# Patient Record
Sex: Male | Born: 1945
Health system: Southern US, Community
[De-identification: ages and names within clinical notes are randomized; demographics above are authoritative.]

## PROBLEM LIST (undated history)

## (undated) DIAGNOSIS — K269 Duodenal ulcer, unspecified as acute or chronic, without hemorrhage or perforation: Secondary | ICD-10-CM

## (undated) DIAGNOSIS — R112 Nausea with vomiting, unspecified: Secondary | ICD-10-CM

## (undated) DIAGNOSIS — K264 Chronic or unspecified duodenal ulcer with hemorrhage: Secondary | ICD-10-CM

## (undated) DIAGNOSIS — Z87442 Personal history of urinary calculi: Secondary | ICD-10-CM

## (undated) DIAGNOSIS — Z8739 Personal history of other diseases of the musculoskeletal system and connective tissue: Secondary | ICD-10-CM

## (undated) DIAGNOSIS — K219 Gastro-esophageal reflux disease without esophagitis: Secondary | ICD-10-CM

## (undated) DIAGNOSIS — K5903 Drug induced constipation: Secondary | ICD-10-CM

## (undated) DIAGNOSIS — I10 Essential (primary) hypertension: Secondary | ICD-10-CM

## (undated) DIAGNOSIS — Z8546 Personal history of malignant neoplasm of prostate: Secondary | ICD-10-CM

## (undated) DIAGNOSIS — C61 Malignant neoplasm of prostate: Secondary | ICD-10-CM

## (undated) DIAGNOSIS — N189 Chronic kidney disease, unspecified: Secondary | ICD-10-CM

## (undated) DIAGNOSIS — M4800 Spinal stenosis, site unspecified: Secondary | ICD-10-CM

## (undated) DIAGNOSIS — M1990 Unspecified osteoarthritis, unspecified site: Secondary | ICD-10-CM

## (undated) DIAGNOSIS — D649 Anemia, unspecified: Secondary | ICD-10-CM

## (undated) DIAGNOSIS — Z5189 Encounter for other specified aftercare: Secondary | ICD-10-CM

## (undated) DIAGNOSIS — R011 Cardiac murmur, unspecified: Secondary | ICD-10-CM

## (undated) DIAGNOSIS — E7800 Pure hypercholesterolemia, unspecified: Secondary | ICD-10-CM

## (undated) DIAGNOSIS — I350 Nonrheumatic aortic (valve) stenosis: Secondary | ICD-10-CM

## (undated) DIAGNOSIS — D689 Coagulation defect, unspecified: Secondary | ICD-10-CM

## (undated) DIAGNOSIS — Z87448 Personal history of other diseases of urinary system: Secondary | ICD-10-CM

## (undated) DIAGNOSIS — Z9889 Other specified postprocedural states: Secondary | ICD-10-CM

## (undated) HISTORY — PX: BACK SURGERY: SHX140

## (undated) HISTORY — PX: PROSTATECTOMY: SHX69

## (undated) HISTORY — DX: Pure hypercholesterolemia, unspecified: E78.00

## (undated) HISTORY — PX: SPINE SURGERY: SHX786

## (undated) HISTORY — DX: Encounter for other specified aftercare: Z51.89

## (undated) HISTORY — PX: CARDIAC CATHETERIZATION: SHX172

## (undated) HISTORY — DX: Personal history of malignant neoplasm of prostate: Z85.46

## (undated) HISTORY — DX: Nonrheumatic aortic (valve) stenosis: I35.0

## (undated) HISTORY — DX: Personal history of other diseases of the musculoskeletal system and connective tissue: Z87.39

## (undated) HISTORY — PX: MOHS SURGERY: SUR867

## (undated) HISTORY — DX: Coagulation defect, unspecified: D68.9

## (undated) HISTORY — DX: Essential (primary) hypertension: I10

## (undated) HISTORY — PX: KNEE ARTHROSCOPY: SUR90

## (undated) HISTORY — PX: JOINT REPLACEMENT: SHX530

## (undated) HISTORY — PX: CARDIAC VALVE REPLACEMENT: SHX585

## (undated) HISTORY — PX: CARPAL TUNNEL RELEASE: SHX101

## (undated) HISTORY — DX: Duodenal ulcer, unspecified as acute or chronic, without hemorrhage or perforation: K26.9

## (undated) HISTORY — DX: Personal history of other diseases of urinary system: Z87.448

## (undated) SURGERY — EGD (ESOPHAGOGASTRODUODENOSCOPY)
Anesthesia: Monitor Anesthesia Care

## (undated) NOTE — *Deleted (*Deleted)
[facility] Pharmacy  Name: Daryl Webb  MRN: [id number] DOB: Dec 09, 1945   Chief Complaint/ HPI  Daryl Webb,  41 y.o. , male presents for their Follow-Up CCM visit with the clinical pharmacist via telephone.   Patient is with his wife during the visit to help him out wit his medications. He reports to be doing well and had stories about his experiences for the past months.  PCP : [place], [name], MD  Their chronic conditions include: HTN, HLD, type 2 diabetes, osteoarthritis, CKD stage II  Office Visits: 11/19/19: Patient presented to Dr. Swaziland for follow-up. No medication changes made.  05/18/19 OV - 6 month f/u. Patient is stable. No changes with her meds. DM2 followed by Endocrinology. Advised to have lipid panel on next appt with endo.  Consult Visit: 07/04/19 OV ([facility]) - Urinary frequency. No changes with meds. Discussed possible causes of dysuria. Consider follow-up with urology for continued symptoms. Klebsiella found on urine culture and started on augmentin 875/125 mg 1 tablet twice daily x 14 days.  Medications: Outpatient Encounter Medications as of 11/28/2019  Medication Sig  . ACCU-CHEK SMARTVIEW test strip Use to test blood sugars once daily  . amLODipine (NORVASC) 5 MG tablet Take 1 tablet (5 mg total) by mouth daily.  Marland Kitchen amoxicillin (AMOXIL) 500 MG capsule TAKE 4 CAPSULES 1 HOUR PRIOR TO APPT  . aspirin 81 MG tablet Take 81 mg by mouth daily.  . Aspirin-Calcium Carbonate 81-777 MG TABS Take by mouth.   Marland Kitchen atorvastatin (LIPITOR) 40 MG tablet Take 1 tablet (40 mg total) by mouth daily.  Marland Kitchen BYDUREON BCISE 2 MG/0.85ML AUIJ Inject into the skin.  . Cholecalciferol 2000 UNITS CAPS Take 1 capsule by mouth daily. VITAMIN D 3  . Coenzyme Q10 (COQ10) 100 MG CAPS Take 100 mg by mouth daily.  . cyanocobalamin (,VITAMIN B-12,) 1000 MCG/ML injection Inject into the muscle every 30 (thirty) days.   . diclofenac (VOLTAREN) 75 MG EC tablet Take  1 tablet (75 mg total) by mouth daily as needed.  . loratadine (CLARITIN) 10 MG tablet Take 5 mg by mouth daily. As needed for allergies  . losartan-hydrochlorothiazide (HYZAAR) 50-12.5 MG tablet TAKE 1 TABLET DAILY  . metFORMIN (GLUCOPHAGE) 500 MG tablet Take 500 mg by mouth 2 (two) times daily.  . metoprolol succinate (TOPROL-XL) 50 MG 24 hr tablet TAKE 1 TABLET DAILY  . Omega-3 Fatty Acids (FISH OIL) 1000 MG CAPS Take 1,000 mg by mouth daily.   No facility-administered encounter medications on file as of 11/28/2019.     Transportation Needs: No Transportation Needs  . Lack of Transportation (Medical): No  . Lack of Transportation (Non-Medical): No     Physical Activity: Inactive  . Days of Exercise per Week: 0 days  . Minutes of Exercise per Session: 0 min    Current Diagnosis/Assessment:  Goals Addressed   None     Hypertension   Office blood pressures are  BP Readings from Last 3 Encounters:  11/19/19 120/78  07/04/19 130/74  05/18/19 110/70    Patient has failed these meds in the past: None Patient is currently controlled on the following medications:  . Amlodipine 5 mg 1 tablet daily HS . Losartan/HCTZ 50-12.5 mg 1 tablet daily AM . Metoprolol succinate 50 mg 1 tablet daily AM  Patient checks BP at home 2-3 times weekly  Patient home BP readings are ranging: 120/70  Patient within goal <130/80. We discussed diet and exercise extensively.  Patient reports having wrist monitor to check BP. Patient states that his diet could be better. Denies having exercise routine.  Plan  Continue current medications    Hyperlipidemia   Lipid Panel     Component Value Date/Time   CHOL 94 06/28/2017 0915   TRIG 112.0 06/28/2017 0915   HDL 27.90 (L) 06/28/2017 0915   CHOLHDL 3 06/28/2017 0915   VLDL 22.4 06/28/2017 0915   LDLCALC 43 06/28/2017 0915   LDLDIRECT 73.8 11/20/2012 0840     The ASCVD Risk score (Goff DC Jr., et al., 2013) failed to calculate for the  following reasons:   The valid total cholesterol range is 130 to 320 mg/dL   Patient has failed these meds in past: None Patient is currently controlled on the following medications:  . Atorvastatin 40 mg 1 tablet daily    Lipid panel outdated (2019). Needs new lipid panel to assess current state. Patient denies routine exercise. Denies myalgias associated with atorvastatin.  Plan  Continue current medications   Diabetes   Recent Relevant Labs: Lab Results  Component Value Date/Time   HGBA1C 6.9 06/05/2019 12:00 AM   HGBA1C 6.7 (H) 06/19/2015 09:06 AM   HGBA1C 7.1 (H) 01/06/2015 08:22 AM   GFR 41.54 (L) 05/18/2019 10:41 AM   GFR 51.26 (L) 12/18/2018 10:48 AM   MICROALBUR 18.9 11/19/2019 10:31 AM   MICROALBUR 84.2 01/06/2015 08:22 AM     Followed by Dr. Sharl Ma from [facility]. Checking BG when eating something that is not healthy. Patient does not want to be pursued lab works under 2 clinics. He mentions having frustration when 2 clinics have gaps with lab results. Patient has been trying to forward his results from Dr. Sharl Ma forward to Dr. Sw[place]il[place]. Current A1c on file is outdated. 06/08/19 - 6.9% per patient from his recent visit with Dr. Sharl Ma.  Patient has failed these meds in past: None Patient is currently controlled on the following medications:  . Bydureon 2 mg pen inject 1 dose into the skin once a week . Metformin 500 mg 1 tablet twice daily  Last diabetic Eye exam: No results found for: HMDIABEYEEXA  Last diabetic Foot exam: No results found for: HMDIABFOOTEX   Plan  Continue current medications   Osteoarthritis   Patient has failed these meds in past: None Patient is currently controlled on the following medications:  . Diclofenac EC 75 mg 1 tablet daily   Patient has been doing well with diclofenac for his arthritis. He mentions that when they tried to take him off this medications, his pain was unbearable. Patient understands the risk of being put on this. Advised  the patient of proper hydration and having food before taking diclofenac.  Plan  Continue current medications   OTCs/Health Maintenance   Patient is currently controlled on the following medications: . Aspirin 81 mg 1 tablet daily . Vitamin D 2000 units 1 capsule daily . CoQ10 100 mg 1 capsule daily . Vitamin B12 1000 mcg/ml inject into the muscle every 30 days . Loratidine 5 mg 1 tablet daily as needed for allergies  . Fish oil 1000 mg 1 capsule daily . Vitamin C 1000 mg 1 tablet daily  Plan  Continue current medications  Vaccines   Reviewed and discussed patient's vaccination history.    Immunization History  Administered Date(s) Administered  . Influenza, High Dose Seasonal PF 12/03/2016, 11/03/2018  . Influenza-Unspecified 01/08/2017  . PFIZER SARS-COV-2 Vaccination 03/12/2019, 04/02/2019, 11/07/2019  . Pneumococcal Polysaccharide-23 01/28/2017  . Zoster  Recombinat (Shingrix) 09/14/2018, 11/17/2018    Plan  Patient current on vaccines.   Medication Management   Pharm[facility]Benefits: [facility] [employer] Adherence: Patient has his own system of taking medication. He has vials lined up on the shelf of their bathroom. Pt endorses 100% compliance  [facility] for 90 day supply and [facility]  Plan  Continue current medication management strategy   Follow up: 4 month phone visit

---

## 1983-02-09 HISTORY — PX: VASECTOMY: SHX75

## 1999-03-03 ENCOUNTER — Ambulatory Visit (HOSPITAL_BASED_OUTPATIENT_CLINIC_OR_DEPARTMENT_OTHER): Admission: RE | Admit: 1999-03-03 | Discharge: 1999-03-03 | Payer: Self-pay | Admitting: Orthopedic Surgery

## 1999-03-17 ENCOUNTER — Ambulatory Visit (HOSPITAL_BASED_OUTPATIENT_CLINIC_OR_DEPARTMENT_OTHER): Admission: RE | Admit: 1999-03-17 | Discharge: 1999-03-17 | Payer: Self-pay | Admitting: Orthopedic Surgery

## 1999-08-07 ENCOUNTER — Other Ambulatory Visit: Admission: RE | Admit: 1999-08-07 | Discharge: 1999-08-07 | Payer: Self-pay | Admitting: Urology

## 1999-08-07 ENCOUNTER — Encounter (INDEPENDENT_AMBULATORY_CARE_PROVIDER_SITE_OTHER): Payer: Self-pay | Admitting: Specialist

## 1999-08-28 ENCOUNTER — Encounter: Payer: Self-pay | Admitting: Urology

## 1999-08-28 ENCOUNTER — Encounter: Admission: RE | Admit: 1999-08-28 | Discharge: 1999-08-28 | Payer: Self-pay | Admitting: Urology

## 1999-10-14 ENCOUNTER — Encounter: Payer: Self-pay | Admitting: Urology

## 1999-10-14 ENCOUNTER — Inpatient Hospital Stay (HOSPITAL_COMMUNITY): Admission: RE | Admit: 1999-10-14 | Discharge: 1999-10-18 | Payer: Self-pay | Admitting: Urology

## 2001-06-01 ENCOUNTER — Encounter: Payer: Self-pay | Admitting: *Deleted

## 2001-06-01 ENCOUNTER — Ambulatory Visit (HOSPITAL_COMMUNITY): Admission: RE | Admit: 2001-06-01 | Discharge: 2001-06-01 | Payer: Self-pay | Admitting: *Deleted

## 2001-06-13 ENCOUNTER — Encounter: Admission: RE | Admit: 2001-06-13 | Discharge: 2001-06-13 | Payer: Self-pay | Admitting: *Deleted

## 2001-06-13 ENCOUNTER — Encounter: Payer: Self-pay | Admitting: *Deleted

## 2002-02-08 HISTORY — PX: ARTHROPLASTY: SHX135

## 2002-03-08 ENCOUNTER — Encounter: Payer: Self-pay | Admitting: Orthopedic Surgery

## 2002-03-12 ENCOUNTER — Inpatient Hospital Stay (HOSPITAL_COMMUNITY): Admission: RE | Admit: 2002-03-12 | Discharge: 2002-03-16 | Payer: Self-pay | Admitting: Orthopedic Surgery

## 2002-08-14 ENCOUNTER — Encounter: Payer: Self-pay | Admitting: Family Medicine

## 2002-08-14 ENCOUNTER — Encounter: Admission: RE | Admit: 2002-08-14 | Discharge: 2002-08-14 | Payer: Self-pay | Admitting: Family Medicine

## 2005-03-10 ENCOUNTER — Emergency Department (HOSPITAL_COMMUNITY): Admission: EM | Admit: 2005-03-10 | Discharge: 2005-03-10 | Payer: Self-pay | Admitting: Emergency Medicine

## 2005-03-22 ENCOUNTER — Ambulatory Visit (HOSPITAL_BASED_OUTPATIENT_CLINIC_OR_DEPARTMENT_OTHER): Admission: RE | Admit: 2005-03-22 | Discharge: 2005-03-22 | Payer: Self-pay | Admitting: Urology

## 2006-05-23 ENCOUNTER — Ambulatory Visit (HOSPITAL_BASED_OUTPATIENT_CLINIC_OR_DEPARTMENT_OTHER): Admission: RE | Admit: 2006-05-23 | Discharge: 2006-05-23 | Payer: Self-pay | Admitting: Urology

## 2006-05-26 ENCOUNTER — Encounter: Admission: RE | Admit: 2006-05-26 | Discharge: 2006-05-26 | Payer: Self-pay | Admitting: Orthopedic Surgery

## 2006-05-27 ENCOUNTER — Encounter: Admission: RE | Admit: 2006-05-27 | Discharge: 2006-05-27 | Payer: Self-pay | Admitting: Orthopedic Surgery

## 2007-04-25 ENCOUNTER — Ambulatory Visit (HOSPITAL_BASED_OUTPATIENT_CLINIC_OR_DEPARTMENT_OTHER): Admission: RE | Admit: 2007-04-25 | Discharge: 2007-04-25 | Payer: Self-pay | Admitting: Urology

## 2009-12-05 ENCOUNTER — Ambulatory Visit: Payer: Self-pay | Admitting: Cardiology

## 2009-12-08 ENCOUNTER — Ambulatory Visit: Payer: Self-pay | Admitting: Cardiology

## 2010-02-26 ENCOUNTER — Encounter: Payer: Self-pay | Admitting: Gastroenterology

## 2010-03-12 NOTE — Letter (Signed)
Summary: Pre Visit Letter Revised  [facility]  [address] [place], [place] [address]   Phone: [phone]  Fax: [phone]        02/26/2010 MRN: [id number]  Daryl Webb [address] [place], [place]  [address]             Procedure Date:  03-31-10 9:30am           Dr Arlyce Dice - Direct Colon   Welcome to the Gastroenterology Division at [facility].    You are scheduled to see a nurse for your pre-procedure visit on 03-31-10 at 9:30am on the 3rd floor at [facility], [address]  We ask that you try to arrive at our office 15 minutes prior to your appointment time to allow for check-in.  Please take a minute to review the attached form.  If you answer "Yes" to one or more of the questions on the first page, we ask that you call the person listed at your earliest opportunity.  If you answer "No" to all of the questions, please complete the rest of the form and bring it to your appointment.    Your nurse visit will consist of discussing your medical and surgical history, your immediate family medical history, and your medications.   If you are unable to list all of your medications on the form, please bring the medication bottles to your appointment and we will list them.  We will need to be aware of both prescribed and over the counter drugs.  We will need to know exact dosage information as well.    Please be prepared to read and sign documents such as consent forms, a financial agreement, and acknowledgement forms.  If necessary, and with your consent, a friend or relative is welcome to sit-in on the nurse visit with you.  Please bring your insurance card so that we may make a copy of it.  If your insurance requires a referral to see a specialist, please bring your referral form from your primary care physician.  No co-pay is required for this nurse visit.     If you cannot keep your appointment, please call [phone] to cancel or reschedule  prior to your appointment date.  This allows [place] the opportunity to schedule an appointment for another patient in need of care.    Thank you for choosing [facility] for your medical needs.  We appreciate the opportunity to care for you.  Please visit [place] at our website  to learn more about our practice.  Sincerely, The Gastroenterology Division

## 2010-03-16 ENCOUNTER — Encounter (INDEPENDENT_AMBULATORY_CARE_PROVIDER_SITE_OTHER): Payer: Self-pay | Admitting: *Deleted

## 2010-03-17 ENCOUNTER — Encounter: Payer: Self-pay | Admitting: Gastroenterology

## 2010-03-17 ENCOUNTER — Encounter (INDEPENDENT_AMBULATORY_CARE_PROVIDER_SITE_OTHER): Payer: Self-pay | Admitting: *Deleted

## 2010-03-26 NOTE — Letter (Signed)
Summary: Diabetic Instructions  [facility]  [address] [place], [place] [address]   Phone: [phone]  Fax: [phone]    KOHEN REITHER [birth year] MRN: [id number]   _ x _   ORAL DIABETIC MEDICATION INSTRUCTIONS  The day before your procedure:   Take your diabetic pill as you do normally  The day of your procedure:   Do not take your diabetic pill    We will check your blood sugar levels during the admission process and again in Recovery before discharging you home  ________________________________________________________________________

## 2010-03-26 NOTE — Letter (Signed)
Summary: [facility] Instructions  [facility]  [address] [place], [place] [address]   Phone: [phone]  Fax: [phone]       Daryl Webb    February 09, 1945    MRN: [id number]        Procedure Day Dorna Bloom:  Jake Shark  03/31/10     Arrival Time:  8:30AM     Procedure Time:  9:30AM     [facility] of Procedure:                    [facility]th [facility]                      PREPARATION FOR COLONOSCOPY WITH MOVIPREP   Starting 5 days prior to your procedure 03/26/10 do not eat nuts, seeds, popcorn, corn, beans, peas,  salads, or any raw vegetables.  Do not take any fiber supplements (e.g. Metamucil, Citrucel, and Benefiber).  THE DAY BEFORE YOUR PROCEDURE         DATE: 03/30/10  DAY: MONDAY  1.  Drink clear liquids the entire day-NO SOLID FOOD  2.  Do not drink anything colored red or purple.  Avoid juices with pulp.  No orange juice.  3.  Drink at least 64 oz. (8 glasses) of fluid/clear liquids during the day to prevent dehydration and help the prep work efficiently.  CLEAR LIQUIDS INCLUDE: Water Jello Ice Popsicles Tea (sugar ok, no milk/cream) Powdered fruit flavored drinks Coffee (sugar ok, no milk/cream) Gatorade Juice: apple, white grape, white cranberry  Lemonade Clear bullion, consomm, broth Carbonated beverages (any kind) Strained chicken noodle soup Hard Candy                             4.  In the morning, mix first dose of MoviPrep solution:    Empty 1 Pouch A and 1 Pouch B into the disposable container    Add lukewarm drinking water to the top line of the container. Mix to dissolve    Refrigerate (mixed solution should be used within 24 hrs)  5.  Begin drinking the prep at 5:00 p.m. The MoviPrep container is divided by 4 marks.   Every 15 minutes drink the solution down to the next mark (approximately 8 oz) until the full liter is complete.   6.  Follow completed prep with 16 oz of clear liquid of your choice  (Nothing red or purple).  Continue to drink clear liquids until bedtime.  7.  Before going to bed, mix second dose of MoviPrep solution:    Empty 1 Pouch A and 1 Pouch B into the disposable container    Add lukewarm drinking water to the top line of the container. Mix to dissolve    Refrigerate  THE DAY OF YOUR PROCEDURE      DATE: 03/31/10   DAY: TUESDAY  Beginning at 4:30AM (5 hours before procedure):         1. Every 15 minutes, drink the solution down to the next mark (approx 8 oz) until the full liter is complete.  2. Follow completed prep with 16 oz. of clear liquid of your choice.    3. You may drink clear liquids until 7:30AM (2 HOURS BEFORE PROCEDURE).   MEDICATION INSTRUCTIONS  Unless otherwise instructed, you should take regular prescription medications with a small sip of water   as early as possible the morning of  your procedure.  Diabetic patients - see separate instructions.   Additional medication instructions: Hold Losartan/HCTZ the morning of procedure.         OTHER INSTRUCTIONS  You will need a responsible adult at least 65 years of age to accompany you and drive you home.   This person must remain in the waiting room during your procedure.  Wear loose fitting clothing that is easily removed.  Leave jewelry and other valuables at home.  However, you may wish to bring a book to read or  an iPod/MP3 player to listen to music as you wait for your procedure to start.  Remove all body piercing jewelry and leave at home.  Total time from sign-in until discharge is approximately 2-3 hours.  You should go home directly after your procedure and rest.  You can resume normal activities the  day after your procedure.  The day of your procedure you should not:   Drive   Make legal decisions   Operate machinery   Drink alcohol   Return to work  You will receive specific instructions about eating, activities and medications before you  leave.    The above instructions have been reviewed and explained to me by   Wyona Almas RN  March 17, 2010 1:30 PM     I fully understand and can verbalize these instructions _____________________________ Date _________

## 2010-03-26 NOTE — Miscellaneous (Signed)
Summary: [facility] Previsit/prep  Clinical Lists Changes  Medications: Added new medication of MOVIPREP 100 GM  SOLR (PEG-KCL-NACL-NASULF-NA ASC-C) As per prep instructions. - Signed Rx of MOVIPREP 100 GM  SOLR (PEG-KCL-NACL-NASULF-NA ASC-C) As per prep instructions.;  #1 x 0;  Signed;  Entered by: Wyona Almas RN;  Authorized by: Louis Meckel MD;  Method [facility]  [address] [phone]*, [address], [place], [place]  [address], Ph: [phone] or [phone], Fax: [phone] Observations: Added new observation of NKA: T (03/17/2010 12:55)    Prescriptions: MOVIPREP 100 GM  SOLR (PEG-KCL-NACL-NASULF-NA ASC-C) As per prep instructions.  #1 x 0   Entered by:   Wyona Almas RN   Authorized by:   Louis Meckel MD   Signed by:   Wyona Almas RN on 03/17/2010   Method used:   [facility]        [facility]  [address] [phone]* (retail)       [address]       [place], [place]  [address]       Ph: [phone] or [phone]       Fax: [phone]   RxID:   [id number]

## 2010-03-31 ENCOUNTER — Other Ambulatory Visit: Payer: Self-pay | Admitting: Gastroenterology

## 2010-03-31 ENCOUNTER — Other Ambulatory Visit (AMBULATORY_SURGERY_CENTER): Payer: BC Managed Care – PPO | Admitting: Gastroenterology

## 2010-03-31 DIAGNOSIS — Z1211 Encounter for screening for malignant neoplasm of colon: Secondary | ICD-10-CM

## 2010-03-31 DIAGNOSIS — D126 Benign neoplasm of colon, unspecified: Secondary | ICD-10-CM

## 2010-03-31 LAB — GLUCOSE, CAPILLARY
Glucose-Capillary: 144 mg/dL — ABNORMAL HIGH (ref 70–99)
Glucose-Capillary: 129 mg/dL — ABNORMAL HIGH (ref 70–99)

## 2010-04-03 ENCOUNTER — Other Ambulatory Visit (INDEPENDENT_AMBULATORY_CARE_PROVIDER_SITE_OTHER): Payer: BC Managed Care – PPO

## 2010-04-03 DIAGNOSIS — E119 Type 2 diabetes mellitus without complications: Secondary | ICD-10-CM

## 2010-04-03 DIAGNOSIS — I119 Hypertensive heart disease without heart failure: Secondary | ICD-10-CM

## 2010-04-07 ENCOUNTER — Encounter: Payer: Self-pay | Admitting: Gastroenterology

## 2010-04-07 ENCOUNTER — Ambulatory Visit (INDEPENDENT_AMBULATORY_CARE_PROVIDER_SITE_OTHER): Payer: BC Managed Care – PPO | Admitting: Cardiology

## 2010-04-07 DIAGNOSIS — I119 Hypertensive heart disease without heart failure: Secondary | ICD-10-CM

## 2010-04-07 DIAGNOSIS — Z79899 Other long term (current) drug therapy: Secondary | ICD-10-CM

## 2010-04-07 DIAGNOSIS — E119 Type 2 diabetes mellitus without complications: Secondary | ICD-10-CM

## 2010-04-07 DIAGNOSIS — E7800 Pure hypercholesterolemia, unspecified: Secondary | ICD-10-CM

## 2010-04-07 NOTE — Procedures (Addendum)
Summary: Colonoscopy  Patient: Daryl Webb Note: All result statuses are Final unless otherwise noted.  Tests: (1) Colonoscopy (COL)   COL Colonoscopy           DONE     [facility]     [address]     [place], [place]  [address]          COLONOSCOPY PROCEDURE REPORT          PATIENT:  Daryl Webb  MR#:  [id number]     BIRTHDATE:  1945/10/06, 65 yrs. old  GENDER:  male          ENDOSCOPIST:  Barbette Hair. Arlyce Dice, MD     Referred by:  Cassell Clement, M.D.          PROCEDURE DATE:  03/31/2010     PROCEDURE:  Colonoscopy with snare polypectomy     ASA CLASS:  Class II     INDICATIONS:  1) Routine Risk Screening          MEDICATIONS:   Fentanyl 75 mcg IV, Versed 7 mg IV          DESCRIPTION OF PROCEDURE:   After the risks benefits and     alternatives of the procedure were thoroughly explained, informed     consent was obtained.  Digital rectal exam was performed and     revealed no abnormalities.   The LB 180AL [id number] endoscope was     introduced through the anus and advanced to the cecum, which was     identified by both the appendix and ileocecal valve, without     limitations.  The quality of the prep was good, using MoviPrep.     The instrument was then slowly withdrawn as the colon was fully     examined.     <<PROCEDUREIMAGES>>          FINDINGS:  A sessile polyp was found in the sigmoid colon. It was     5 mm in size. It was found 18 cm from the point of entry. Polyp     was snared without cautery. Retrieval was successful (see     image14). snare polyp  This was otherwise a normal examination of     the colon (see image2, image4, image6, image8, and image15).     Retroflexed views in the rectum revealed no abnormalities.    The     time to cecum =  3.75  minutes. The scope was then withdrawn (time     =  8.75  min) from the patient and the procedure completed.          COMPLICATIONS:  None          ENDOSCOPIC IMPRESSION:     1) 5  mm sessile polyp in the sigmoid colon     2) Otherwise normal examination     RECOMMENDATIONS:     1) If the polyp(s) removed today are proven to be adenomatous     (pre-cancerous) polyps, you will need a repeat colonoscopy in 5     years. Otherwise you should continue to follow colorectal cancer     screening guidelines for "routine risk" patients with colonoscopy     in 10 years.          REPEAT EXAM:   You will receive a letter from Dr. Arlyce Dice in 1-2     weeks, after reviewing the final pathology, with followup     recommendations.  ______________________________     Barbette Hair Arlyce Dice, MD          CC:          n.     eSIGNED:   Barbette Hair. Kaplan at 03/31/2010 10:51 AM          Vonna Drafts, [id number]  Note: An exclamation mark (!) indicates a result that was not dispersed into the flowsheet. Document Creation Date: 03/31/2010 10:51 AM _______________________________________________________________________  (1) Order result status: Final Collection or observation date-time: 03/31/2010 10:46 Requested date-time:  Receipt date-time:  Reported date-time:  Referring Physician:   Ordering Physician: Melvia Heaps [phone]) Specimen Source:  Source: Launa Grill Order Number: [id number] Lab site:   Appended Document: Colonoscopy     Procedures Next Due Date:    Colonoscopy: 04/2020

## 2010-04-08 ENCOUNTER — Ambulatory Visit: Payer: Self-pay | Admitting: Cardiology

## 2010-04-16 NOTE — Letter (Signed)
Summary: Patient Notice-Hyperplastic Polyps  [facility]  [address] [place], [place] [address]   Phone: [phone]  Fax: [phone]        April 07, 2010 MRN: [id number]    Daryl Webb [address] [place], [place]  [address]    Dear Mr. BROCK,  I am pleased to inform you that the colon polyp(s) removed during your recent colonoscopy was (were) found to be hyperplastic.  These types of polyps are NOT pre-cancerous.  It is therefore my recommendation that you have a repeat colonoscopy examination in 10_ years for routine colorectal cancer screening.  Should you develop new or worsening symptoms of abdominal pain, bowel habit changes or bleeding from the rectum or bowels, please schedule an evaluation with either your primary care physician or with me.  Additional information/recommendations:  __No further action with gastroenterology is needed at this time.      Please follow-up with your primary care physician for your other      healthcare needs. __Please call [phone] to schedule a return visit to review      your situation.  __Please keep your follow-up visit as already scheduled.  _x_Continue treatment plan as outlined the day of your exam.  Please call us if you are having persistent problems or have questions about your condition that have not been fully answered at this time.  Sincerely,  Louis Meckel MD This letter has been electronically signed by your physician.  Appended Document: Patient Notice-Hyperplastic Polyps letter mailed

## 2010-06-23 NOTE — Op Note (Signed)
NAME:  Daryl Webb, Daryl Webb NO.:  [id number]   MEDICAL RECORD NO.:  [id number]          PATIENT TYPE:  AMB   LOCATION:  [facility]                         FACILITY:  [facility]   PHYSICIAN:  Bertram Millard. Dahlstedt, M.D.DATE OF BIRTH:  13-May-1945   DATE OF PROCEDURE:  04/25/2007  DATE OF DISCHARGE:                               OPERATIVE REPORT   PREOPERATIVE DIAGNOSIS:  Recurrent urethral stricture   POSTOPERATIVE DIAGNOSIS:  Recurrent urethral stricture   PROCEDURES:  1. Cystoscopy.  2. Balloon dilatation of recurrent urethral stricture.   SURGEON:  Bertram Millard. Dahlstedt, M.D.   ANESTHESIA:  General with LMA.   COMPLICATIONS:  None.   BRIEF HISTORY:  A 65 year old male who is a few years out from radical  prostatectomy.  He has no evidence of recurrent prostate cancer.  He  presented to the office yesterday in Dr. Enos Fling absence.  He has  been having a much more difficult time with urination.  Symptoms are  reminiscent of prior slowing of his stream when he did have a urethral  stricture.  As he is quite symptomatic at this point, it was recommended  that he have another procedure for his stricture.  Risks and  complications of balloon dilatation of the stricture were discussed with  him at length.  He understands these and desires to proceed.   DESCRIPTION OF PROCEDURE:  The patient was identified in the holding  area, administered preoperative IV antibiotics and taken to the  operating room, where a general anesthetic was administered using the  LMA.  He was placed in the dorsal lithotomy position.  Genitalia and  perineum were prepped and draped.  A time-out was then called.  Proper  patient, proper procedure, diagnosis, allergy and positioning were  discussed.  We then commenced with the procedure.   A 22-French panendoscope was passed under direct vision up to the  urethral stricture, which was at the distal bulbous urethra.  It was  quite tight.  A a sensor-tip  guidewire was passed through this.  Over  top of this a nephrostomy balloon was passed.  It was inflated to  approximately 18 atmospheres of pressure for approximately 5 minutes.  The balloon traversed the urethral stricture and actually went all the  way into the bladder through the bladder neck.  Once the balloon was  inflated for 5 minutes, the balloon was then deflated and removed.  Cystoscopy revealed easy passage of the scope all the way through the  stricture.  There was a minimal narrowing of the bladder neck but no  significant contracture.  The bladder appeared otherwise normal.  At  this point about 100 mL of irrigant was left in the bladder.  The scope  was removed and the procedure terminated.   The patient tolerated the procedure well.  He was awakened and taken to  the PACU in stable condition.   He will follow up with me in the office in 1 week.  He was discharged on  Utira-C one p.o. q.6 h. p.r.n. burning and Bactrim DS one p.o. b.i.d.  for 3 days.      Jeannett Senior  Elza Rafter, M.D.  Electronically Signed     SMD/MEDQ  D:  04/25/2007  T:  04/25/2007  Job:  [id number]

## 2010-06-26 NOTE — Discharge Summary (Signed)
. [facility]  Patient:    Daryl Webb, Daryl Webb                 MRN: [id number] Adm. Date:  [id number] Disc. Date: [id number] Attending:  Lauree Chandler                           Discharge Summary  FINAL DIAGNOSES: 1. Carcinoma of the prostate. 2. Hypertension. 3. Arthritis. 4. Hypercholesterolemia.  PROCEDURES:  Radical retropubic prostatectomy and bilateral pelvic lymph node dissection on October 14, 1999.  HISTORY OF PRESENT ILLNESS:  This 65 year old white male was found to have a Gleason grade 7 carcinoma because he had a suspicious nodule on the prostate with a PSA of 2.68.  He was counseled about therapy and opted for radical retropubic prostatectomy.  PAST MEDICAL HISTORY:  Elevated cholesterol, arthritis, and history of hypertension.  PHYSICAL EXAMINATION:  Noncontributory, except for a suspected nodule on the right lobe of the prostate.  HOSPITAL COURSE:  After admission, he underwent radical retropubic prostatectomy and bilateral pelvic lymph node dissection.  The final pathology revealed Gleason grade 7 carcinoma felt to be confined to the prostate, but involving both lobes.  The nodes were negative.  During his postoperative recovery, he did well.  He had a few bladder spasms early on, but he was able to ambulate and became afebrile and progressed to a soft diet by the time he was ready for discharge on October 18, 1999.  On October 17, 1999, his Harrison Mons drain had been removed.  DISPOSITION:  He was sent home on October 18, 1999, with Foley catheter to leg bag.  ACTIVITY:  Limited.  DIET:  As tolerated.  CONDITION ON DISCHARGE:  He was sent home in good condition.  FOLLOW-UP:  He will return to the office next week for skin staple removal. DD:  11/02/99 TD:  11/02/99 Job: [id number]

## 2010-06-26 NOTE — Op Note (Signed)
[place]. [facility]  Patient:    Daryl Webb, Daryl Webb                 MRN: [id number] Proc. Date: 10/14/99 Adm. Date:  [id number] Attending:  Lauree Chandler CC:         Clovis Pu [name] Sermons, M.D.   Operative Report  PREOPERATIVE DIAGNOSIS:  Carcinoma of the prostate.  POSTOPERATIVE DIAGNOSIS:  Carcinoma of the prostate.  PROCEDURE:  Radical retropubic prostatectomy and bilateral pelvic lymph node dissection.  SURGEON:  Maretta Bees. Vonita Moss, M.D.  ASSISTANT:  Lucrezia Starch. Ovidio Hanger, M.D.  ANESTHESIA:  General.  INDICATIONS:  This 65 year old gentleman had a needle biopsy of the prostate. 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  This was done because he was felt to have a prostatic nodule despite a PSA of only 2.68.  He was brought to the OR today for curative therapy.  PROCEDURE:  The patient was brought to the operating room, placed in supine position and the lower abdomen and external genitalia were prepped and draped in the usual fashion.  A 24-French/30 cc Foley was placed per urethra and the bladder drained.  A mid lower vertical incision was made just above the symphysis pubis below the umbilicus and the rectus fascia and muscle divided in the midline.  The bilateral pelvic retroperitoneal spaces were dissected out and the right obturator lymph node packet was dissected out and sent for permanent section.  Hemostasis and lymphostasis was obtained with metal hemoclips.  The great vessels and obturator nerve were intact.  In like fashion, the left obturator and lymph node packet was dissected out and sent to pathology.  The endopelvic fascia was then taken down.  The puboprostatic ligaments divided at the attachment to the symphysis pubis.  A Hohenfeltner clamp was placed dorsal venous complex and a heavy Vicryl tied around it and subsequently also for added hemostasis later in the case, a  2-0 Vicryl suture ligature in a figure-of-eight fashion was placed in this dorsal vein complex. The urethra was divided just beyond the apex of the prostate.  The Foley was brought up out of the wound and posterior urethra divided and the prostate dissected off the rectum without difficulty.  Although, it was initially a little bit stuck, but certainly free without injury the the rectum.  The puboprostatic ligaments were taken down bilaterally and clipped with metal hemoclips.  The anterior bladder neck was opened and the indigo carmen-stained urine was ejected from ureteral orifices well away from the posterior bladder neck, which was then divided with cautery in a plane between the bladder and the prostate easily dissected out.  The seminal vesicles and ampulla of each vas were dissected down to the tips of each seminal vesicle.  These structures were then divided and ligated with metal hemoclips and the specimen removed intact.  A couple small bleeders to the bladder neck were suture ligated.  The bladder mucosa was reapproximated to the surrounding bladder neck muscle with interrupted 4-0 chromic catgut.  The posterior bladder neck was closed in tennis racket fashion with running 2-0 chromic catgut, which was well away from the ureteral orifices.  The bladder neck/urethral anastomosis was then completed over a 22-French/5 cc Foley by placing 2-0 Vicryl at 2, 5, 7, 10 and 12 oclock before the Foley was placed in the bladder, a #1 Prolene was tied through the tips of the catheter and then brought  out through the anterior bladder wall and through the abdominal wall and later sewn to skin level attached to a button.  The anastomosis tied down quite nicely and the bladder was irrigated and there was blue-stained urine with no leak.  Catheter was placed on traction.  Stab wound was placed to the left of our incision through which a large flat Blake drain was placed to drain the prevesical  space.  The drain was sutured with black silk and connected later to a bulb suction. Wound was irrigated with triple antibiotic solution.  The rectus fascia was closed with running #1 PDS.  Subcu was irrigated again with triple antibiotic solution.  The skin closed with skin staples.  Wound was cleaned, dressed with dry, sterile gauze dressings.  Sponge, needle and instrument counts were correct.  Estimated blood loss was 300 cc.  He tolerated the procedure well and was taken to recovery room in good condition. DD:  10/14/99 TD:  10/14/99 Job: [id number]

## 2010-06-26 NOTE — H&P (Signed)
NAME:  Daryl Webb, Daryl Webb NO.:  [id number]   MEDICAL RECORD NO.:  [id number]                   PATIENT TYPE:  INP   LOCATION:  0462                                 FACILITY:  [facility]   PHYSICIAN:  Ollen Gross, M.D.                 DATE OF BIRTH:  [birth year]   DATE OF ADMISSION:  03/12/2002  DATE OF DISCHARGE:                                HISTORY & PHYSICAL   DATE OF OFFICE VISIT HISTORY AND PHYSICAL:  March 06, 2002.   CHIEF COMPLAINT:  Right knee pain.   HISTORY OF PRESENT ILLNESS:  The patient is a 65 year old male who has been  seen by Dr. Lequita Halt for a second opinion concerning progressive right knee  pain.  The patient was treated in the past by Dr. Richardson Landry for a torn  medial meniscus and underwent arthroscopic procedure back in July 2001.  He  was noted to have significant medial compartment arthritis at that point.  He underwent subsequent injections including Synvisc.  However, the pain  continued to progress.  He was told at that time he would be an appropriate  candidate for knee replacement surgery.  He was also seen by two physicians  in [facility] who felt he was too young to consider knee replacement back  in 2001.  He was later seen by Dr. Darrelyn Hillock in November 2002, who recommended  a unicompartmental knee replacement.  He has now had progressive symptoms  and is hurting all the time and also night pain.  He was seen in  consultation by Dr. Lequita Halt.  X-rays taken in the office demonstrated  advanced bone-on-bone changes in the medial compartment with varus  deformity.  The lateral side appeared unaffected, but there was some  possible chondrocalcinosis in the lateral meniscus.  He was noted to have  moderate patellofemoral changes with spurring and narrowing of the  patellofemoral joint.  He has been treated conservatively but continues to  have pain, and it was felt that he would be appropriate for a total knee  replacement.  The  risks and benefits of this procedure have been discussed  with the patient at length, and he has elected to proceed with surgical  intervention.   ALLERGIES:  No known drug allergies.   CURRENT MEDICATIONS:  1. Diclofenac 75 mg p.o. b.i.d. stopped prior to surgery.  2. Hyzaar 50/12.5 mg, 1 p.o. daily.  3. Norvasc 5 mg, 1 p.o. daily.  4. Lipitor 10 mg, 1 p.o. daily.  5. He was also taking over-the-counter Claritin-D and glucosamine DS.   PAST MEDICAL HISTORY:  1. Hypertension.  2. Hypercholesterolemia.  3. Osteoarthritis.   PAST SURGICAL HISTORY:  1. Bilateral carpal tunnel repairs in January and February 2001.  2. Right medial meniscectomy in July 2001.  3. Radical prostatectomy in September 2001.   FAMILY HISTORY:  Father deceased, age 70, with history of diabetes.  Mother  deceased, age 66,  with congestive heart failure, heart disease.   SOCIAL HISTORY:  Married, retired, nonsmoker, no alcohol.  Has two children.  His wife will be assisting with care after surgery.  He lives in a [place]  home with five steps entering.   REVIEW OF SYSTEMS:  GENERAL:  No fevers, chills, or night sweats.  NEUROLOGIC:  No seizures, syncope, or paralysis.  RESPIRATORY:  No shortness  of breath, productive cough, or hemoptysis.  CARDIOVASCULAR:  No chest pain,  angina, or orthopnea.  GASTROINTESTINAL:  No nausea, vomiting, diarrhea, or  constipation.  GENITOURINARY:  No dysuria, hematuria, or discharge.  MUSCULOSKELETAL:  Pertinent to that of the right knee found in the history  of present illness.   PHYSICAL EXAMINATION:  VITAL SIGNS:  Pulse 84, respirations 16, blood  pressure 158/98.  GENERAL:  The patient is a 65 year old white male, well-nourished, well-  developed.  Appears to be in no acute distress.  He is alert, oriented, and  cooperative.  HEENT:  Normocephalic, atraumatic.  Pupils round and reactive.  EOMs are  intact.  NECK:  Supple.  CHEST:  Clear to auscultation anterior and  posterior chest walls.  No  rhonchi, rales, or wheezing.  HEART:  Regular rate and rhythm.  No murmur.  S1, S2 noted.  ABDOMEN:  Soft and nontender.  Bowel sounds present.  RECTAL, BREASTS, GENITALIA:  Not done.  Not pertinent to present illness.  EXTREMITIES:  Limited to that of the right lower extremity:  Range of motion  of 5-115 degrees with significant medial tenderness with moderate varus  deformity of about 5 degrees.  No lateral pain.  He has moderate  patellofemoral crepitus.   LABORATORY DATA:  X-rays show advanced bone-on-bone changes medial  compartment with moderate patellofemoral changes.   IMPRESSION:  1. Osteoarthritis right knee.  2. Hypertension.  3. Hypercholesterolemia.    PLAN:  The patient will be admitted to [facility] to undergo a  right total knee replacement arthroplasty.  The patient's medical doctor is  Dr. Francesca Oman, and his cardiologist is Dr. Ronny Flurry.  Both  physicians will be notified of the room number on admission and will be  consulted if needed for any medical assistance with this patient throughout  the hospital course.     Alexzandrew L. Julien Girt, P.A.              Ollen Gross, M.D.    ALP/MEDQ  D:  03/13/2002  T:  03/13/2002  Job:  [id number]   cc:   Meredith Staggers, M.D.  [address]  [place]  [place] [address]  Fax: [phone]   Cassell Clement, M.D.  [address]  [place]  [place] [address]  Fax: [phone]

## 2010-06-26 NOTE — Op Note (Signed)
NAME:  Daryl Webb, Daryl Webb NO.:  [id number]   MEDICAL RECORD NO.:  [id number]          PATIENT TYPE:  AMB   LOCATION:  [facility]                         FACILITY:  [facility]   PHYSICIAN:  Maretta Bees. Vonita Moss, M.D.DATE OF BIRTH:  Jan 21, 1946   DATE OF PROCEDURE:  03/22/2005  DATE OF DISCHARGE:                                 OPERATIVE REPORT   PREOPERATIVE DIAGNOSES:  Bladder neck contracture and urethral stricture.   POSTOPERATIVE DIAGNOSES:  Severe urethral stricture and mild bladder neck  contracture.   PROCEDURE:  Cystoscopy and holmium laser incision of urethral stricture and  bladder neck contracture.   SURGEON:  Dr. Larey Dresser.   ANESTHESIA:  General.   INDICATIONS:  This 65 year old gentleman has undergone previous radical  prostatectomy whose had PSAs zero  comes in because of recurrent problems  with a very small urinary stream and straining to void.   DESCRIPTION OF PROCEDURE:  The patient was brought to the operating room and  placed in lithotomy position, external genitalia were prepped and draped in  the usual fashion. He was cystoscoped and the anterior urethra was normal  except for a pinpoint bulbous urethral stricture that was incised with the  holmium laser at 12 o'clock that then opened it up nicely to easily accept  the cystoscope. The external sphincter was intact and he had a mild BNC that  was incised at 6 o'clock with the holmium laser. There were no intravesical  lesions. There was minimal bleeding as the scope removed, the urethra was  seen to be wide open. He was taken to the recovery room in good condition  having tolerated the procedure well.      Maretta Bees. Vonita Moss, M.D.  Electronically Signed     LJP/MEDQ  D:  03/22/2005  T:  03/22/2005  Job:  [id number]

## 2010-06-26 NOTE — Op Note (Signed)
[place]. [facility]  Patient:    Daryl Webb                  MRN: [id number] Proc. Date: 03/03/99 Adm. Date:  [id number] Attending:  [name], [name] CC:         [name]. [name], [name], M.D. 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  POSTOPERATIVE DIAGNOSIS:  Entrapment neuropathy of median nerve at right carpal  tunnel.  OPERATION:  Release of _____ transverse carpal ligament.  SURGEON:  [name]. Naaman Plummer., M.D.  ASSISTANT:  Rhoderick Moody, P.A.  ANESTHESIA:  General by mask.  SUPERVISING ANESTHESIOLOGIST:  Janetta Hora. Gelene Mink, M.D.  INDICATIONS:  The patient is a 65 year old man who has experienced numbness in is right hand.  Clinical examination revealed evidence of carpal tunnel syndrome.  Electrodiagnostic studies confirmed significant median neuropathy at the right carpal tunnel.  Due to a failure to respond to nonoperative measures, he is brought to the operating room at this time for release of his right transverse carpal ligament.  DESCRIPTION OF PROCEDURE:  The patient was brought to the operating room and placed in the supine position on the operating table.  Following the induction of general anesthesia by mask, the right arm was prepped with Betadine soap and solution, nd sterilely draped.  Following exsanguination of the limb with the Esmarch bandage, the arterial tourniquet was inflated to 250 mmHg.  The procedure commenced with a short incision in the line of the ring finger and the palm.  The subcutaneous tissues were carefully divided along with palmar fascia.  This was split longitudinally from the common extensor branch of the median nerve.  These were followed back to the transverse carpal ligament which was carefully isolated from the median nerve.  The ligament was released on its ulnar border  extending to the distal forearm.  This widely opened the carpal canal.  No masses or other abnormalities were noted.  Bleeding points were electrocauterized with bipolar current followed by repair of the skin with intradermal 3-0 Prolene suture.  A compressive dressing was applied and a volar plaster splint with the hand and  wrist in 5 degrees of dorsiflexion.  The patient tolerated the surgery and anesthesia well.  He was transferred to the recovery room with stable vital signs.  He will be discharged with prescriptions for Percocet 5/325 one or two tablets p.o. q.4-6h. p.r.n. pain.  He will return to the office in 10 days for suture removal and initiation of an exercise program. DD:  03/03/99 TD:  03/03/99 Job: [id number]

## 2010-06-26 NOTE — Op Note (Signed)
NAME:  Daryl Webb NO.:  [id number]   MEDICAL RECORD NO.:  [id number]                   PATIENT TYPE:  INP   LOCATION:  X006                                 FACILITY:  [facility]   PHYSICIAN:  Ollen Gross, M.D.                 DATE OF BIRTH:  1945-04-28   DATE OF PROCEDURE:  03/12/2002  DATE OF DISCHARGE:                                 OPERATIVE REPORT   PREOPERATIVE DIAGNOSIS:  Osteoarthritis, right knee.   POSTOPERATIVE DIAGNOSIS:  Osteoarthritis, right knee.   PROCEDURE:  Right total knee arthroplasty.   SURGEON:  Ollen Gross, M.D.   ASSISTANT:  Alexzandrew L. Julien Girt, P.A.   ANESTHESIA:  General.   ESTIMATED BLOOD LOSS:  Minimal.   DRAINS:  Hemovac x1.   COMPLICATIONS:  None.   TOURNIQUET TIME:  52 minutes at 300 mmHg.   CONDITION:  Stable to recovery.   BRIEF CLINICAL NOTE:  The patient is a 65 year old male with significant  medial compartment arthritis and patellofemoral arthritis of the right knee  with pain refractory to nonoperative management.  He presents for right  total knee arthroplasty.   DESCRIPTION OF PROCEDURE:  After successful administration of a general  anesthetic, a Foley catheter was attempted to be placed and given the  patient's prior history of prostate surgery, it was difficult to pass the  catheter.  We got the urologist, Bertram Millard. Dahlstedt, M.D., to do a  cystoscopy and place a catheter.  Once this was completed, then a tourniquet  was placed high in the right thigh and the right lower extremity prepped and  draped in the usual sterile fashion.  The extremity was wrapped with an  Esmarch and the knee flexed and tourniquet inflated to 300 mmHg.  Standard  midline incision was made with a 10 blade through subcutaneous tissue to the  level of the extensor mechanism.  A fresh blade was used to make a medial  parapatellar arthrotomy and then the soft tissue over the proximal medial  tibia  subperiosteally elevated to the joint line with a knife and into the  semimembranosus bursa with a curved osteotome.  Soft tissue over the  proximal lateral tibia is elevated with attention being paid to avoid the  patellar tendon on the tibial tubercle.  The patella is then everted, knee  flexed, and the ACL and PCL removed.  He had bone-on-bone changes in the  medial compartment and significant osteophyte formation in the  patellofemoral compartment.  The drill was then used to create a starting  hole in the distal femur.  The canal was irrigated and the 5 degree right  valgus alignment guide is placed.  Referencing off the posterior condyles,  rotation is marked in a block, pinned to remove 11 mm off the distal femur.  Distal femoral resection is made with an oscillating saw.  Sizing block is  placed and size 4 is the most appropriate.  Rotation is marked with the  epicondylar axis.  A size 4 cutting block is placed and anterior and  posterior cuts made.  The tibia was then subluxed forward and the menisci  removed.  The extramedullary tibial alignment guide is placed, referencing  proximally at the medial aspect of the tibial tubercle and distally along  the second metatarsal axis of the tibial crest.  This block is pinned to  remove 10 mm off the nondeficient lateral side.  Tibial resection is made  with an oscillating saw.  A size 4 is the most appropriate tibial component  and then the proximal tibia is prepared with a modular drill and keel punch.  Femoral preparation is completed with the intercondylar and chamfer cuts.  A  size 4 posterior-stabilized femoral trial, a size 4 mobile bearing tibial  trial, and a 10 mm posterior-stabilized rotating platform insert trial were  placed.  Full extension is achieved with excellent varus and valgus balance  throughout full range of motion.   The patella is everted, thickness measured to be 23 mm.  Free-hand resection  taken to 13 mm.  The  38 template placed, lug holes drilled, trial patella  placed, and it tracks normal.  The osteophytes are then removed off the  posterior femur with the trial in place.  All trials are then removed and  the cut bone surfaces prepared with pulsatile lavage.  The cement is mixed  and once ready for implantation, the size 4 mobile bearing tibial tray, size  4 posterior-stabilized femur, and 38 mm patella are cemented into place and  the patella is held with a clamp.  The trial 10 mm insert is placed, knee  held in full extension, and all extruded cement removed.  Once the cement is  fully hardened, then the permanent 10 mm posterior-stabilized rotating  platform insert is placed.  The knee is reduced with excellent varus and  valgus balance throughout.  The wound is copiously irrigated with antibiotic  solution and the extensor mechanism is closed over a Hemovac drain with  interrupted #1 PDS.  The tourniquet is released with a total time of 52  minutes and flexion against gravity is 135 degrees.  The subcu is closed  with interrupted 2-0 Vicryl, subcuticular running 4-0 Monocryl.  The  catheter is then threaded into the subcu tissues for the Marcaine pain pump.  Steri-Strips and a bulky sterile dressing are then applied to the knee and  the patient subsequently awakened and transported to recovery in stable  condition.                                               Ollen Gross, M.D.    FA/MEDQ  D:  03/12/2002  T:  03/12/2002  Job:  [id number]

## 2010-06-26 NOTE — Discharge Summary (Signed)
NAME:  Daryl Webb, Daryl Webb NO.:  [id number]   MEDICAL RECORD NO.:  [id number]                   PATIENT TYPE:  INP   LOCATION:  0462                                 FACILITY:  [facility]   PHYSICIAN:  Ollen Gross, M.D.                 DATE OF BIRTH:  1945/03/14   DATE OF ADMISSION:  03/12/2002  DATE OF DISCHARGE:  03/16/2002                                 DISCHARGE SUMMARY   ADMISSION DIAGNOSES:  1. Osteoarthritis of the right knee.  2. Hypertension.  3. Hypercholesterolemia.   DISCHARGE DIAGNOSES:  1. Osteoarthritis of the right knee status post right total knee replacement     with arthroplasty.  2. Status post cystoscopy and catheter placement per Dr. Marcine Matar     at the time of surgery.  3. Hypertension.  4. Hypercholesterolemia.   PROCEDURE:  The patient was taken to the operating room on 03/12/2002.  She  unfortunately had trouble with Foley catheter insertion.  Therefore, Dr.  Marcine Matar was contacted by Dr. Lequita Halt, and the patient underwent a  cystoscopy with Foley catheter placement prior to the surgery.  Once this  was performed, the patient underwent a right total knee replacement with  arthroplasty per Dr. Ollen Gross, assisted by Alexzandrew L. Perkins,  P.A.C.  under general anesthesia.  Minimal blood loss.  Hemovac drain x1.  Tourniquet time 52 minutes at 300 mmHg.   CONSULTATIONS:  Urology with Dr. Retta Diones.   HISTORY OF PRESENT ILLNESS:  The patient is a 65 year old male who has been  seen by Dr. Lequita Halt for ongoing right knee pain.  The patient was treated in  the past by Dr. Richardson Landry for a torn meniscus and underwent an arthroscopic  procedure back in 08/1999.  He was noted to have significant medial  compartment arthritis.  He has undergone injections, including Synvisc which  only provided temporary relief.  He has been seen by other physicians in the  past for his progressive symptoms.  He eventually was  evaluated by Dr.  Lequita Halt for a second opinion.  X-rays taken in the office demonstrated  advanced bone on bone changes in the medial compartment with a varus  deformity.  He was noted to have some moderate patellofemoral changes with  spurring and narrowing the patellofemoral joint.  It was felt due to the  findings and the patient's progressive pain that he would benefit from  undergoing total knee replacement.  The risks and benefits of this procedure  have been discussed with the patient.  He has elected to proceed with  surgery.   LABORATORY DATA:  CBC on admission revealed a hemoglobin of 15.6, hematocrit  44.6, white cell count 7.6, red cell count 4.80, differential all within  normal limits.  Postoperative H&H 13.6 and 39.5.  Last noted H&H 13.5 and  39.0.  PT/PTT on admission were 13.4 and 29, respectively, with an INR of  1.0.  Serum protimes were followed per Coumadin protocol.  Last noted PT/INR  23.4 and 2.4.  Chem panel on admission revealed a minimally elevated glucose  at 135.  Remaining chem panel all within normal limits.  Serial BMETs were  followed and electrolytes remained within normal limits.  Glucose did go up  from 135 to 160 and was back down to 142.  Urinalysis on admission was  negative.  Blood group type was 0 positive.   EKG dated 03/08/2002 revealed normal sinus rhythm with occasional premature  supraventricular complexes, septal ___________.  This is an unconfirmed EKG.   Chest x-ray dated 03/08/2002 revealed mild cardiomegaly, no active lung  disease.   HOSPITAL COURSE:  The patient was admitted to [facility] and taken  to the operating room.  He underwent a urology consult in the operating room  suite.  He was seen by Dr. Marcine Matar secondary to a difficult Foley  insertion preoperatively.  While he was in the OR suite, the patient  underwent a cystoscopy and then Foley catheter placement per Dr. Retta Diones.  It was recommended that we  leave the Foley catheter in for several days  before removing.  Following this, he underwent the above said procedure  without complication.  The patient tolerated the procedure well and was  later transferred to the recovery room and then to the orthopedic floor for  continued postoperative care.  The patient was placed on PCA analgesics for  pain control following the surgery.  He was placed on Coumadin for DVT  prophylaxis.  PT and OT were consulted to assist with gait training and  ambulation.  It was recommended that we keep the catheter in until Thursday  by urology services.  The Hemovac drain which was placed at the time of  surgery was pulled on postoperative day #1.  Dressing change was initiated  on postoperative day #2.  His incision was healing well, and the dressing  was changed daily.  PT and OT consulted to assist with gait training and  ambulation postoperatively.  The patient did very well with physical  therapy.  He was up and ambulating approximately 10 feet by postoperative  day #2 and then 100 feet by the evening of postoperative day #2 and then  even 200 feet by postoperative day #3 and 400 feet by that evening.  The  patient did extremely well with physical therapy.  Therefore, it was felt  that he would be going home with home health PT.  By day #3, the patient was  doing much better, had been weaned over to p.o. medications.  On day #3,  which was Thursday, the catheter was removed.  It was noted on the following  day that the patient was voiding without difficulty.  He was seen on  03/16/2002.  He was up and ambulating greater than 400 feet.  He was passing  flatus; however, he had not had a bowel movement yet, so bowel medications  were used.  Once he was moving his bowels, he was discharged home later that  day.   DISPOSITION:  The patient was discharged home on 03/16/2002.   DISCHARGE MEDICATIONS:  1. Percocet for pain.  2. Robaxin for spasm. 3. Coumadin as  per protocol.   DIET:  Low sodium, low cholesterol diet.   ACTIVITY:  Weightbearing as tolerated to the right lower extremity.   DISCHARGE INSTRUCTIONS:  Home health PT, home health nursing, total knee  protocol.   FOLLOW UP:  The patient is to follow up in one and a half weeks.  Call the  office for an appointment at [phone].   CONDITION ON DISCHARGE:  Improved.      Alexzandrew L. Julien Girt, P.A.              Ollen Gross, M.D.    ALP/MEDQ  D:  04/06/2002  T:  04/06/2002  Job:  [id number]

## 2010-06-26 NOTE — H&P (Signed)
[facility]  Patient:    Daryl Webb, Daryl Webb                 MRN: [id number] Adm. Date:  [id number] Attending:  Lauree Chandler CC:         Clovis Pu [name] Sermons, M.D.   History and Physical  HISTORY OF PRESENT ILLNESS:  This 64 year old white male has had a history of prostatitis in the past. He had a PSA of 1.6 in 1998. In 1999 there was a question of a nodule at the right base of the prostate and he was treated with antibiotics and his PSA was 2.2. In June 1999 there was a question whether there was still a soft nodule and Dr. Vernie Ammons checked him and wondered whether it might even be in the seminal vesicle. Rectal exams in September and October that year revealed no evidence of a nodule or anything else. Rectal exam in May 2000 was unremarkable and a PSA was 2.65. In May 2001 there was a concern about a possible nodule in the right lateral lobe that was not very well delineated. His PSA was stable at 2.68, but I felt that at this point, just to eliminate any doubt, a biopsy needed to be done. 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 He and his wife had extensive discussions about therapy and he has opted for radical retropubic prostatectomy.  He is in general good health. He has had a recent stress test that was unremarkable by Dr. [name] Sermons. He has a long history of hypertension. He has a history of arthritis.  MEDICATIONS: 1. Norvasc 5 mg a day. 2. Hyzaar 50/12.5 mg a day. 3. Lipitor 10 mg a day. 4. Diclofenac 75 mg a day. 5. Ferrous sulfate 325 mg b.i.d.  ALLERGIES:  None.  TOBACCO:  None.  ALCOHOL:  None.  PAST SURGICAL HISTORY: 1. Bilateral carpal tunnel surgery. 2. Right knee arthroscopy.  FAMILY HISTORY:  Noncontributory.  REVIEW OF SYSTEMS:  No other health history.  PHYSICAL EXAMINATION:  GENERAL:  He is  a pleasant white male in no acute distress, appearing his stated age.  NECK:  Supple.  CHEST:  Clear.  HEART:  Tones regular.  ABDOMEN:  Soft and nontender.  EXTERNAL GENITALIA:  Normal.  PROSTATE:  Deferred today.  EXTREMITIES:  No edema.  IMPRESSION: 1. Carcinoma of the prostate. 2. Hypertension. 3. Arthritis. 4. Hypercholesterolemia. DD:  10/14/99 TD:  10/14/99 Job: [id number]

## 2010-06-26 NOTE — Op Note (Signed)
NAME:  Daryl Webb, Daryl Webb NO.:  [id number]   MEDICAL RECORD NO.:  [id number]          PATIENT TYPE:  AMB   LOCATION:  [facility]                         FACILITY:  [facility]   PHYSICIAN:  Maretta Bees. Vonita Moss, M.D.DATE OF BIRTH:  09/11/1945   DATE OF PROCEDURE:  05/23/2006  DATE OF DISCHARGE:                               OPERATIVE REPORT   PREOPERATIVE DIAGNOSIS:  Bladder neck contracture and urethral stricture   POSTOPERATIVE DIAGNOSIS:  Same.   PROCEDURE:  Cystoscopy and laser of urethral stricture.   SURGEON:  Dr. Larey Dresser   ANESTHESIA:  General.   INDICATIONS:  This gentleman has had a previous radical prostatectomy  and has had a tight urinary stream, it has gotten smaller.  He has had a  previous laser incision of his bladder neck and a deep bulbous urethral  stricture.  He comes in now for therapy of recurrent stricture.   PROCEDURE:  The patient was brought to operating room and placed  lithotomy position.  External genitalia were prepped and draped in usual  fashion.  He was cystoscoped and I encountered a deep bulbous urethral  stricture that was almost pinpoint.  I used the holmium laser to incise  the stricture at 12 o'clock and I a smaller incision at 6 o'clock and  then observed that the bladder neck appeared patent.  I then was easily  able to pass a 26-French sound.  Cystoscopy revealed unremarkable  bladder.   Because of some bleeding at the laser incision I did put in a 20-French  Foley that I will leave in for about 30 minutes post-op and then remove  it and give him a voiding trial before he goes home.  The patient was  taken to recovery room in good condition.      Maretta Bees. Vonita Moss, M.D.  Electronically Signed     LJP/MEDQ  D:  05/23/2006  T:  05/23/2006  Job:  [id number]

## 2010-08-06 ENCOUNTER — Telehealth: Payer: Self-pay | Admitting: Cardiology

## 2010-08-06 DIAGNOSIS — E119 Type 2 diabetes mellitus without complications: Secondary | ICD-10-CM

## 2010-08-06 DIAGNOSIS — I119 Hypertensive heart disease without heart failure: Secondary | ICD-10-CM

## 2010-08-06 DIAGNOSIS — Z79899 Other long term (current) drug therapy: Secondary | ICD-10-CM

## 2010-08-06 DIAGNOSIS — E7800 Pure hypercholesterolemia, unspecified: Secondary | ICD-10-CM

## 2010-08-06 NOTE — Telephone Encounter (Signed)
Pt said he didn't have return appt for June and he is been coming every 4 months please call

## 2010-08-06 NOTE — Telephone Encounter (Signed)
Left message

## 2010-08-07 NOTE — Telephone Encounter (Signed)
Scheduled appointment for patient.

## 2010-08-11 ENCOUNTER — Other Ambulatory Visit (INDEPENDENT_AMBULATORY_CARE_PROVIDER_SITE_OTHER): Payer: BC Managed Care – PPO | Admitting: *Deleted

## 2010-08-11 DIAGNOSIS — E119 Type 2 diabetes mellitus without complications: Secondary | ICD-10-CM

## 2010-08-11 DIAGNOSIS — Z79899 Other long term (current) drug therapy: Secondary | ICD-10-CM

## 2010-08-11 DIAGNOSIS — I119 Hypertensive heart disease without heart failure: Secondary | ICD-10-CM

## 2010-08-11 DIAGNOSIS — E7800 Pure hypercholesterolemia, unspecified: Secondary | ICD-10-CM

## 2010-08-11 LAB — LIPID PANEL
Total CHOL/HDL Ratio: 3
VLDL: 28.6 mg/dL (ref 0.0–40.0)

## 2010-08-11 LAB — BASIC METABOLIC PANEL
BUN: 24 mg/dL — ABNORMAL HIGH (ref 6–23)
CO2: 27 mEq/L (ref 19–32)
Chloride: 106 mEq/L (ref 96–112)
GFR: 66.41 mL/min (ref >60.00)
Glucose, Bld: 165 mg/dL — ABNORMAL HIGH (ref 70–99)
Potassium: 4.6 mEq/L (ref 3.5–5.1)
Sodium: 143 mEq/L (ref 135–145)

## 2010-08-11 LAB — HEPATIC FUNCTION PANEL
ALT: 36 U/L (ref 0–53)
Bilirubin, Direct: 0 mg/dL (ref 0.0–0.3)
Total Bilirubin: 0.2 mg/dL — ABNORMAL LOW (ref 0.3–1.2)

## 2010-08-14 ENCOUNTER — Encounter: Payer: Self-pay | Admitting: *Deleted

## 2010-08-14 DIAGNOSIS — Z87448 Personal history of other diseases of urinary system: Secondary | ICD-10-CM | POA: Insufficient documentation

## 2010-08-14 DIAGNOSIS — E7800 Pure hypercholesterolemia, unspecified: Secondary | ICD-10-CM | POA: Insufficient documentation

## 2010-08-14 DIAGNOSIS — Z8546 Personal history of malignant neoplasm of prostate: Secondary | ICD-10-CM | POA: Insufficient documentation

## 2010-08-14 DIAGNOSIS — M159 Polyosteoarthritis, unspecified: Secondary | ICD-10-CM | POA: Insufficient documentation

## 2010-08-17 ENCOUNTER — Other Ambulatory Visit: Payer: Self-pay | Admitting: *Deleted

## 2010-08-17 ENCOUNTER — Encounter: Payer: Self-pay | Admitting: Cardiology

## 2010-08-17 ENCOUNTER — Ambulatory Visit (INDEPENDENT_AMBULATORY_CARE_PROVIDER_SITE_OTHER): Payer: BC Managed Care – PPO | Admitting: Cardiology

## 2010-08-17 DIAGNOSIS — E119 Type 2 diabetes mellitus without complications: Secondary | ICD-10-CM

## 2010-08-17 DIAGNOSIS — I10 Essential (primary) hypertension: Secondary | ICD-10-CM

## 2010-08-17 DIAGNOSIS — I119 Hypertensive heart disease without heart failure: Secondary | ICD-10-CM

## 2010-08-17 DIAGNOSIS — I4891 Unspecified atrial fibrillation: Secondary | ICD-10-CM

## 2010-08-17 DIAGNOSIS — E7800 Pure hypercholesterolemia, unspecified: Secondary | ICD-10-CM

## 2010-08-17 MED ORDER — LOSARTAN POTASSIUM-HCTZ 50-12.5 MG PO TABS: 1.0000 | ORAL_TABLET | Freq: Every day | ORAL | Status: DC

## 2010-08-17 NOTE — Telephone Encounter (Signed)
Filled Rx. For generic hyzaar to [facility]

## 2010-08-17 NOTE — Assessment & Plan Note (Signed)
Patient has a history of essential hypertension he has not been having any headaches or dizzy spells.  Denies chest pain or shortness of breath.

## 2010-08-17 NOTE — Progress Notes (Signed)
Tawni Pummel Graff Date of Birth:  1945-10-11 [facility] [address]   [address] [place], [place]  [address] [phone]           Fax   [phone]  History of Present Illness: This pleasant 65 year old gentleman is seen for a scheduled 4 month followup office visit.  He has a history of essential hypertension, diabetes mellitus, and hypercholesterolemia.  He also has a history of exogenous obesity and a past history of prostate cancer with a past history of urethral stricture.  Since last visit he's been feeling well.  He's been more physically reactive.  He bought a new the ACL or sailboat which she has been renovating.  He has cut back on his calories and has lost 7 pounds.  He's not having a chest pain or shortness of breath.  He does not have any history of ischemic heart disease and had a normal nuclear stress test on 03/14/08.  He had an echocardiogram on 05/03/90 which showed mild LVH and no significant valve abnormalities.  Current Outpatient Prescriptions  Medication Sig Dispense Refill  . amLODipine-atorvastatin (CADUET) 5-40 MG per tablet Take 1 tablet by mouth daily.        Marland Kitchen aspirin 81 MG tablet Take 81 mg by mouth daily.        . diclofenac (VOLTAREN) 75 MG EC tablet Take 75 mg by mouth 2 (two) times daily.        Marland Kitchen loratadine (CLARITIN) 10 MG tablet Take 10 mg by mouth daily. As needed       . meclizine (ANTIVERT) 25 MG tablet Take 25 mg by mouth 3 (three) times daily as needed.        . metFORMIN (GLUCOPHAGE) 500 MG tablet Take 500 mg by mouth 2 (two) times daily with a meal.        . metoprolol (TOPROL-XL) 50 MG 24 hr tablet Take 50 mg by mouth daily.        Marland Kitchen DISCONTD: losartan-hydrochlorothiazide (HYZAAR) 50-12.5 MG per tablet Take 1 tablet by mouth daily.        Marland Kitchen losartan-hydrochlorothiazide (HYZAAR) 50-12.5 MG per tablet Take 1 tablet by mouth daily.  90 tablet  3    No Known Allergies  Patient Active Problem List  Diagnoses  .  Hypercholesterolemia  . History of prostate cancer  . History of urethral stricture  . H/O: osteoarthritis  . Benign hypertensive heart disease without heart failure  . Diabetes mellitus    History  Smoking status  . Former Smoker  . Types: Cigarettes  Smokeless tobacco  . Not on file    History  Alcohol Use     Family History  Problem Relation Age of Onset  . Heart disease Mother   . Heart failure Mother   . Heart disease Father   . Diabetes Father     Review of Systems: Constitutional: no fever chills diaphoresis or fatigue or change in weight.  Head and neck: no hearing loss, no epistaxis, no photophobia or visual disturbance. Respiratory: No cough, shortness of breath or wheezing. Cardiovascular: No chest pain peripheral edema, palpitations. Gastrointestinal: No abdominal distention, no abdominal pain, no change in bowel habits hematochezia or melena. Genitourinary: No dysuria, no frequency, no urgency, no nocturia. Musculoskeletal:No arthralgias, no back pain, no gait disturbance or myalgias. Neurological: No dizziness, no headaches, no numbness, no seizures, no syncope, no weakness, no tremors. Hematologic: No lymphadenopathy, no easy bruising. Psychiatric: No confusion, no hallucinations, no  sleep disturbance.    Physical Exam: Filed Vitals:   08/17/10 0935  BP: 136/80  Pulse: 76   The general appearance reveals a well-developed well-nourished pleasant gentleman in no distress.The head and neck exam reveals pupils equal and reactive.  Extraocular movements are full.  There is no scleral icterus.  The mouth and pharynx are normal.  The neck is supple.  The carotids reveal no bruits.  The jugular venous pressure is normal.  The  thyroid is not enlarged.  There is no lymphadenopathy.  The chest is clear to percussion and auscultation.  There are no rales or rhonchi.  Expansion of the chest is symmetrical.  The precordium is quiet.  The first heart sound is normal.   The second heart sound is physiologically split.  There is no murmur gallop rub or click.  There is no abnormal lift or heave.  The abdomen is soft and nontender.  The bowel sounds are normal.  The liver and spleen are not enlarged.  There are no abdominal masses.  There are no abdominal bruits.  Extremities reveal good pedal pulses.  There is no phlebitis or edema.  There is no cyanosis or clubbing.  Strength is normal and symmetrical in all extremities.  There is no lateralizing weakness.  There are no sensory deficits.  The skin is warm and dry.  There is no rash.  Assessment / Plan: We reviewed his labs which are improving especially his A1c.  He will continue same medication and be rechecked in 4 months for followup office visit and fasting lab work and we will recheck his A1c again then

## 2010-08-17 NOTE — Assessment & Plan Note (Signed)
The patient has a history of diabetes mellitus.  His last visit he has lost 7 pounds and has been more physically active and his A1c has improved.  He is not having any hypoglycemic episodes

## 2010-08-17 NOTE — Assessment & Plan Note (Signed)
The patient has a history of hypercholesterolemia and is on Caduet which is a combination of amlodipine and Lipitor.  His cholesterol numbers are quite good and he is tolerating the medicine fine

## 2010-10-14 ENCOUNTER — Other Ambulatory Visit: Payer: Self-pay | Admitting: *Deleted

## 2010-10-14 DIAGNOSIS — E119 Type 2 diabetes mellitus without complications: Secondary | ICD-10-CM

## 2010-10-14 MED ORDER — METFORMIN HCL 500 MG PO TABS: 500.0000 mg | ORAL_TABLET | Freq: Two times a day (BID) | ORAL | Status: DC

## 2010-10-14 NOTE — Telephone Encounter (Signed)
Patient dropped off refill request

## 2010-11-02 LAB — POCT I-STAT, CHEM 8
BUN: 18
Calcium, Ion: 1.28
Chloride: 104
Creatinine, Ser: 0.9
Glucose, Bld: 121 — ABNORMAL HIGH
Potassium: 3.8

## 2010-11-11 ENCOUNTER — Telehealth: Payer: Self-pay | Admitting: Cardiology

## 2010-11-11 NOTE — Telephone Encounter (Signed)
Pt calling about going on Medicaid soon. Pt would like to talk about medications and the cost changes once pt goes on Medicaid. Please return pt call to discuss further.

## 2010-11-12 NOTE — Telephone Encounter (Signed)
Left message

## 2010-11-25 NOTE — Telephone Encounter (Signed)
Never heard back from patient.

## 2010-12-14 ENCOUNTER — Other Ambulatory Visit (INDEPENDENT_AMBULATORY_CARE_PROVIDER_SITE_OTHER): Payer: Medicare Other | Admitting: *Deleted

## 2010-12-14 ENCOUNTER — Other Ambulatory Visit: Payer: BC Managed Care – PPO | Admitting: *Deleted

## 2010-12-14 DIAGNOSIS — I119 Hypertensive heart disease without heart failure: Secondary | ICD-10-CM

## 2010-12-14 DIAGNOSIS — E119 Type 2 diabetes mellitus without complications: Secondary | ICD-10-CM

## 2010-12-14 LAB — BASIC METABOLIC PANEL
BUN: 21 mg/dL (ref 6–23)
CO2: 27 mEq/L (ref 19–32)
Chloride: 105 mEq/L (ref 96–112)
Creatinine, Ser: 1.2 mg/dL (ref 0.4–1.5)

## 2010-12-14 LAB — LIPID PANEL
HDL: 34.9 mg/dL — ABNORMAL LOW (ref >39.00)
VLDL: 28.2 mg/dL (ref 0.0–40.0)

## 2010-12-16 ENCOUNTER — Encounter: Payer: Self-pay | Admitting: Cardiology

## 2010-12-16 ENCOUNTER — Ambulatory Visit (INDEPENDENT_AMBULATORY_CARE_PROVIDER_SITE_OTHER): Payer: Medicare Other | Admitting: Cardiology

## 2010-12-16 VITALS — BP 146/86 | HR 75 | Ht 67.0 in | Wt 188.0 lb

## 2010-12-16 DIAGNOSIS — I10 Essential (primary) hypertension: Secondary | ICD-10-CM

## 2010-12-16 DIAGNOSIS — E119 Type 2 diabetes mellitus without complications: Secondary | ICD-10-CM

## 2010-12-16 DIAGNOSIS — I119 Hypertensive heart disease without heart failure: Secondary | ICD-10-CM

## 2010-12-16 DIAGNOSIS — E7800 Pure hypercholesterolemia, unspecified: Secondary | ICD-10-CM

## 2010-12-16 MED ORDER — LOSARTAN POTASSIUM-HCTZ 100-12.5 MG PO TABS: 1.0000 | ORAL_TABLET | Freq: Every day | ORAL | Status: DC

## 2010-12-16 NOTE — Assessment & Plan Note (Signed)
She has a history of diabetes.  His hemoglobin A1c has improved slightly since last time.  We will continue metformin 500 mg twice a day with meals.  We have encouraged him to try harder to lose weight.  We've also encouraged him to join the Silver sneakers which his insurance will pay for.

## 2010-12-16 NOTE — Patient Instructions (Signed)
Increase your Losartan to 100/12.5 mg daily-has been called in to [facility]   Your physician recommends that you schedule a follow-up appointment in: 4 months with fasting labs (lp/bmet/hfp)

## 2010-12-16 NOTE — Assessment & Plan Note (Signed)
The patient has a history of high blood pressure.  His blood pressure is still running high today.  He has been on losartan HCT 50/12.5 and we are going to increase him to 100/12.5 one daily.

## 2010-12-16 NOTE — Progress Notes (Signed)
Daryl Webb Date of Birth:  [birth year] [facility] [address]   [address] [place], [place]  [address] [phone]           Fax   [phone]  History of Present Illness: This pleasant 65 year old gentleman is seen for a scheduled four-month followup office visit.  He has a history of elevated cholesterol, high blood pressure, and diabetes.  He has been feeling well since last visit.  Is not expressing any chest pain or angina.  He said no racing of his heart.  He has lost 1 pound since last visit.  Current Outpatient Prescriptions  Medication Sig Dispense Refill  . amLODipine-atorvastatin (CADUET) 5-40 MG per tablet Take 1 tablet by mouth daily.        Marland Kitchen aspirin 81 MG tablet Take 81 mg by mouth daily.        . diclofenac (VOLTAREN) 75 MG EC tablet Take 75 mg by mouth 2 (two) times daily.        Marland Kitchen loratadine (CLARITIN) 10 MG tablet Take 10 mg by mouth daily. As needed       . meclizine (ANTIVERT) 25 MG tablet Take 25 mg by mouth 3 (three) times daily as needed.        . metFORMIN (GLUCOPHAGE) 500 MG tablet Take 1 tablet (500 mg total) by mouth 2 (two) times daily with a meal.  180 tablet  3  . metoprolol (TOPROL-XL) 50 MG 24 hr tablet Take 50 mg by mouth daily.        Marland Kitchen DISCONTD: losartan-hydrochlorothiazide (HYZAAR) 50-12.5 MG per tablet Take 1 tablet by mouth daily.  90 tablet  3  . losartan-hydrochlorothiazide (HYZAAR) 100-12.5 MG per tablet Take 1 tablet by mouth daily.  90 tablet  3    No Known Allergies  Patient Active Problem List  Diagnoses  . Hypercholesterolemia  . History of prostate cancer  . History of urethral stricture  . H/O: osteoarthritis  . Benign hypertensive heart disease without heart failure  . Diabetes mellitus    History  Smoking status  . Former Smoker  . Types: Cigarettes  Smokeless tobacco  . Not on file    History  Alcohol Use     Family History  Problem Relation Age of Onset  . Heart disease  Mother   . Heart failure Mother   . Heart disease Father   . Diabetes Father     Review of Systems: Constitutional: no fever chills diaphoresis or fatigue or change in weight.  Head and neck: no hearing loss, no epistaxis, no photophobia or visual disturbance. Respiratory: No cough, shortness of breath or wheezing. Cardiovascular: No chest pain peripheral edema, palpitations. Gastrointestinal: No abdominal distention, no abdominal pain, no change in bowel habits hematochezia or melena. Genitourinary: No dysuria, no frequency, no urgency, no nocturia. Musculoskeletal:No arthralgias, no back pain, no gait disturbance or myalgias. Neurological: No dizziness, no headaches, no numbness, no seizures, no syncope, no weakness, no tremors. Hematologic: No lymphadenopathy, no easy bruising. Psychiatric: No confusion, no hallucinations, no sleep disturbance.    Physical Exam: Filed Vitals:   12/16/10 1005  BP: 146/86  Pulse: 75   general appearance reveals a well-developed slightly overweight gentleman in no distress.  He checked his blood pressure was 150/84.The head and neck exam reveals pupils equal and reactive.  Extraocular movements are full.  There is no scleral icterus.  The mouth and pharynx are normal.  The neck is supple.  The carotids  reveal no bruits.  The jugular venous pressure is normal.  The  thyroid is not enlarged.  There is no lymphadenopathy.  The chest is clear to percussion and auscultation.  There are no rales or rhonchi.  Expansion of the chest is symmetrical.  The precordium is quiet.  The first heart sound is normal.  The second heart sound is physiologically split.  There is no murmur gallop rub or click.  There is no abnormal lift or heave.  The abdomen is soft and nontender.  The bowel sounds are normal.  The liver and spleen are not enlarged.  There are no abdominal masses.  There are no abdominal bruits.  Extremities reveal good pedal pulses.  There is no phlebitis or  edema.  There is no cyanosis or clubbing.  Strength is normal and symmetrical in all extremities.  There is no lateralizing weakness.  There are no sensory deficits.  The skin is warm and dry.  There is no rash.     Assessment / Plan: Increase losartan HCT.  Her other meds as is.  Recheck in 4 months for followup office visit and lab work

## 2010-12-16 NOTE — Assessment & Plan Note (Signed)
The patient has a history of hypercholesterolemia.  He is on Caduet.  He has not been experiencing any allergens from the statin therapy.

## 2011-02-14 ENCOUNTER — Other Ambulatory Visit: Payer: Self-pay | Admitting: Cardiology

## 2011-02-15 NOTE — Telephone Encounter (Signed)
Refilled metoprolol 

## 2011-02-24 ENCOUNTER — Other Ambulatory Visit: Payer: Self-pay | Admitting: Cardiology

## 2011-02-25 NOTE — Telephone Encounter (Signed)
Refilled caduet and metformin

## 2011-03-04 ENCOUNTER — Other Ambulatory Visit: Payer: Self-pay | Admitting: Cardiology

## 2011-03-04 DIAGNOSIS — I10 Essential (primary) hypertension: Secondary | ICD-10-CM

## 2011-03-04 MED ORDER — LOSARTAN POTASSIUM-HCTZ 50-12.5 MG PO TABS: 1.0000 | ORAL_TABLET | Freq: Every day | ORAL | Status: DC

## 2011-03-04 NOTE — Telephone Encounter (Signed)
Daryl Webb states he is just not feeling well since his Losartan/HCTZ was increased to 100/12.5.  He states he lost all his energy.  He went back to the 50/12.5 and feels much better.  He is requesting a refill of the 50/12.5 from [facility].

## 2011-03-04 NOTE — Telephone Encounter (Signed)
Ok per Dr [name] Sermons for pt to decrease to Losartan 50/12.5mg  daily.  Med was ordered.

## 2011-03-04 NOTE — Telephone Encounter (Signed)
New problem Pt said losartan is not working for him, he is taking 100/12.5. Please call him back. He would like to go back to 50/12.5.

## 2011-04-12 ENCOUNTER — Other Ambulatory Visit (INDEPENDENT_AMBULATORY_CARE_PROVIDER_SITE_OTHER): Payer: Medicare Other

## 2011-04-12 DIAGNOSIS — E7800 Pure hypercholesterolemia, unspecified: Secondary | ICD-10-CM

## 2011-04-12 LAB — BASIC METABOLIC PANEL WITH GFR
BUN: 25 mg/dL — ABNORMAL HIGH (ref 6–23)
CO2: 28 meq/L (ref 19–32)
Calcium: 9.6 mg/dL (ref 8.4–10.5)
Chloride: 106 meq/L (ref 96–112)
Creatinine, Ser: 1.4 mg/dL (ref 0.4–1.5)
GFR: 52.57 mL/min — ABNORMAL LOW
Glucose, Bld: 154 mg/dL — ABNORMAL HIGH (ref 70–99)
Potassium: 4.8 meq/L (ref 3.5–5.1)
Sodium: 140 meq/L (ref 135–145)

## 2011-04-12 LAB — HEPATIC FUNCTION PANEL
Albumin: 4 g/dL (ref 3.5–5.2)
Alkaline Phosphatase: 57 U/L (ref 39–117)
Total Protein: 6.9 g/dL (ref 6.0–8.3)

## 2011-04-12 LAB — LIPID PANEL: HDL: 34.7 mg/dL — ABNORMAL LOW (ref >39.00)

## 2011-04-12 NOTE — Progress Notes (Signed)
Quick Note:  Please make copy of labs for patient visit. ______ 

## 2011-04-14 ENCOUNTER — Encounter: Payer: Self-pay | Admitting: Cardiology

## 2011-04-14 ENCOUNTER — Ambulatory Visit (INDEPENDENT_AMBULATORY_CARE_PROVIDER_SITE_OTHER): Payer: Medicare Other | Admitting: Cardiology

## 2011-04-14 DIAGNOSIS — E7800 Pure hypercholesterolemia, unspecified: Secondary | ICD-10-CM | POA: Diagnosis not present

## 2011-04-14 DIAGNOSIS — R011 Cardiac murmur, unspecified: Secondary | ICD-10-CM | POA: Diagnosis not present

## 2011-04-14 DIAGNOSIS — E119 Type 2 diabetes mellitus without complications: Secondary | ICD-10-CM

## 2011-04-14 DIAGNOSIS — I119 Hypertensive heart disease without heart failure: Secondary | ICD-10-CM | POA: Diagnosis not present

## 2011-04-14 NOTE — Assessment & Plan Note (Signed)
The patient denies any chest pain or shortness of breath with ordinary activity.  He has not been getting much exercise.  Is not expressing any palpitations or racing of his heart.  He denies any dizziness or syncope.  He has done better since he started taking his morning medicines first thing in the morning rather than midmorning

## 2011-04-14 NOTE — Progress Notes (Signed)
Daryl Webb Date of Birth:  Oct 19, 1945 [facility] [address] [place], [place]  [address] [phone]         Fax   [phone]  History of Present Illness: This pleasant 66 year old gentleman is seen for a scheduled followup office visit.  He has a history of high blood pressure and a history of diabetes and elevated cholesterol.  He also has a history of prostate cancer and a history of urethral stricture.  He has also had osteoarthritis for which he is followed by orthopedics.  Since last visit he has been doing well.  He has not been able to lose any weight however.  Current Outpatient Prescriptions  Medication Sig Dispense Refill  . amLODipine-atorvastatin (CADUET) 5-40 MG per tablet TAKE 1 TABLET DAILY  90 tablet  3  . aspirin 81 MG tablet Take 81 mg by mouth daily.        . diclofenac (VOLTAREN) 75 MG EC tablet Take 75 mg by mouth 2 (two) times daily.        Marland Kitchen loratadine (CLARITIN) 10 MG tablet Take 10 mg by mouth daily. As needed       . losartan-hydrochlorothiazide (HYZAAR) 50-12.5 MG per tablet Take 1 tablet by mouth daily.  90 tablet  1  . metFORMIN (GLUCOPHAGE) 500 MG tablet TAKE 2 TABLETS DAILY. DOSE CHANGE  180 tablet  3  . metoprolol (TOPROL-XL) 50 MG 24 hr tablet TAKE 1 TABLET DAILY  90 tablet  3    No Known Allergies  Patient Active Problem List  Diagnoses  . Hypercholesterolemia  . History of prostate cancer  . History of urethral stricture  . H/O: osteoarthritis  . Benign hypertensive heart disease without heart failure  . Diabetes mellitus    History  Smoking status  . Former Smoker  . Types: Cigarettes  Smokeless tobacco  . Not on file    History  Alcohol Use     Family History  Problem Relation Age of Onset  . Heart disease Mother   . Heart failure Mother   . Heart disease Father   . Diabetes Father     Review of Systems: Constitutional: no fever chills diaphoresis or fatigue or change in weight.  Head  and neck: no hearing loss, no epistaxis, no photophobia or visual disturbance. Respiratory: No cough, shortness of breath or wheezing. Cardiovascular: No chest pain peripheral edema, palpitations. Gastrointestinal: No abdominal distention, no abdominal pain, no change in bowel habits hematochezia or melena. Genitourinary: No dysuria, no frequency, no urgency, no nocturia. Musculoskeletal:No arthralgias, no back pain, no gait disturbance or myalgias. Neurological: No dizziness, no headaches, no numbness, no seizures, no syncope, no weakness, no tremors. Hematologic: No lymphadenopathy, no easy bruising. Psychiatric: No confusion, no hallucinations, no sleep disturbance.    Physical Exam: Filed Vitals:   04/14/11 0922  BP: 140/82  Pulse: 66   The general appearance reveals a well-developed well-nourished middle-aged gentleman in no distress.Pupils equal and reactive.   Extraocular Movements are full.  There is no scleral icterus.  The mouth and pharynx are normal.  The neck is supple.  The carotids reveal no bruits.  The jugular venous pressure is normal.  The thyroid is not enlarged.  There is no lymphadenopathy.  The chest is clear to percussion and auscultation. There are no rales or rhonchi. Expansion of the chest is symmetrical.  The precordium is quiet.  The first heart sound is normal.  The second heart sound is physiologically split.  There is no gallop rub or click.  There is a grade 2/6 harsh systolic ejection murmur at the aortic area.  No diastolic murmur heard.  No gallop or rub.  There is no abnormal lift or  The abdomen is soft and nontender. Bowel sounds are normal. The liver and spleen are not enlarged. There Are no abdominal masses. There are no bruits.  The pedal pulses are good.  There is no phlebitis or edema.  There is no cyanosis or clubbing. Strength is normal and symmetrical in all extremities.  There is no lateralizing weakness.  There are no sensory  deficits.  Integument there is no skin rash  EKG shows normal sinus rhythm with LVH and strain, increased since 2010  Assessment / Plan:  The patient will continue same medication.  We will get a two-dimensional echocardiogram to evaluate his murmur.  Recheck in 4 months for office visit and fasting lab work including hemoglobin A1c.  Work harder on exercise weight loss and diet

## 2011-04-14 NOTE — Assessment & Plan Note (Signed)
The patient has a systolic ejection murmur at the aortic area.  His electrocardiogram today shows some early left ventricular strain pattern which has increased since 2010.  We will get a two-dimensional echocardiogram to evaluate his basilar systolic heart murmur further

## 2011-04-14 NOTE — Assessment & Plan Note (Signed)
The patient is on Caduet 5/40 for control of his blood pressure and his cholesterol.  He is not having any side effects from the lipid lowering agent.  He does have a history of osteoarthritis and is on Voltaren.  We rechecked his liver function studies which remained normal.

## 2011-04-14 NOTE — Assessment & Plan Note (Signed)
The patient has a history of diabetes and is on metformin.  His blood sugars remained elevated.  He needs to work harder on diet and exercise.  When he returns next time we will also check a hemoglobin A1c.  He is not having any hypoglycemic episodes

## 2011-04-14 NOTE — Patient Instructions (Signed)
Your physician has requested that you have an echocardiogram. Echocardiography is a painless test that uses sound waves to create images of your heart. It provides your doctor with information about the size and shape of your heart and how well your heart's chambers and valves are working. This procedure takes approximately one hour. There are no restrictions for this procedure.  Your physician recommends that you continue on your current medications as directed. Please refer to the Current Medication list given to you today. Your physician wants you to follow-up in: 4 months You will receive a reminder letter in the mail two months in advance. If you don't receive a letter, please call our office to schedule the follow-up appointment.  

## 2011-04-23 ENCOUNTER — Other Ambulatory Visit: Payer: Self-pay

## 2011-04-23 ENCOUNTER — Ambulatory Visit (HOSPITAL_COMMUNITY): Payer: Medicare Other | Attending: Internal Medicine

## 2011-04-23 DIAGNOSIS — E119 Type 2 diabetes mellitus without complications: Secondary | ICD-10-CM | POA: Diagnosis not present

## 2011-04-23 DIAGNOSIS — R011 Cardiac murmur, unspecified: Secondary | ICD-10-CM | POA: Insufficient documentation

## 2011-04-23 DIAGNOSIS — E785 Hyperlipidemia, unspecified: Secondary | ICD-10-CM | POA: Insufficient documentation

## 2011-04-29 ENCOUNTER — Telehealth: Payer: Self-pay | Admitting: Cardiology

## 2011-04-29 NOTE — Telephone Encounter (Signed)
Advised patient

## 2011-04-29 NOTE — Telephone Encounter (Signed)
Fu call °Patient returning your call °

## 2011-04-29 NOTE — Telephone Encounter (Signed)
Message copied by Burnell Blanks on Thu Apr 29, 2011  1:28 PM ------      Message from: Cassell Clement      Created: Sat Apr 24, 2011  5:09 PM       Please report.  The echo shows only mild aortic thickening but no severe stenosis. Continue present meds.

## 2011-08-14 ENCOUNTER — Other Ambulatory Visit: Payer: Self-pay | Admitting: Cardiology

## 2011-08-16 NOTE — Telephone Encounter (Signed)
Refilled losartan/hctz 

## 2011-08-24 DIAGNOSIS — L03039 Cellulitis of unspecified toe: Secondary | ICD-10-CM | POA: Diagnosis not present

## 2011-08-30 ENCOUNTER — Ambulatory Visit (INDEPENDENT_AMBULATORY_CARE_PROVIDER_SITE_OTHER): Payer: Medicare Other | Admitting: *Deleted

## 2011-08-30 DIAGNOSIS — E7800 Pure hypercholesterolemia, unspecified: Secondary | ICD-10-CM | POA: Diagnosis not present

## 2011-08-30 DIAGNOSIS — Z8546 Personal history of malignant neoplasm of prostate: Secondary | ICD-10-CM | POA: Diagnosis not present

## 2011-08-30 DIAGNOSIS — E119 Type 2 diabetes mellitus without complications: Secondary | ICD-10-CM

## 2011-08-30 DIAGNOSIS — I119 Hypertensive heart disease without heart failure: Secondary | ICD-10-CM

## 2011-08-30 LAB — LIPID PANEL
HDL: 35 mg/dL — ABNORMAL LOW (ref >39.00)
LDL Cholesterol: 49 mg/dL (ref 0–99)
Total CHOL/HDL Ratio: 3
Triglycerides: 110 mg/dL (ref 0.0–149.0)
VLDL: 22 mg/dL (ref 0.0–40.0)

## 2011-08-30 LAB — BASIC METABOLIC PANEL
Calcium: 9.6 mg/dL (ref 8.4–10.5)
Creatinine, Ser: 1.2 mg/dL (ref 0.4–1.5)

## 2011-08-30 LAB — HEPATIC FUNCTION PANEL
ALT: 26 U/L (ref 0–53)
AST: 22 U/L (ref 0–37)
Albumin: 3.9 g/dL (ref 3.5–5.2)

## 2011-08-30 LAB — HEMOGLOBIN A1C: Hgb A1c MFr Bld: 7.7 % — ABNORMAL HIGH (ref 4.6–6.5)

## 2011-08-30 NOTE — Progress Notes (Signed)
Quick Note:  Please make copy of labs for patient visit. ______ 

## 2011-09-01 ENCOUNTER — Encounter: Payer: Self-pay | Admitting: Cardiology

## 2011-09-01 ENCOUNTER — Ambulatory Visit (INDEPENDENT_AMBULATORY_CARE_PROVIDER_SITE_OTHER): Payer: Medicare Other | Admitting: Cardiology

## 2011-09-01 VITALS — BP 140/88 | HR 78 | Ht 67.0 in | Wt 185.0 lb

## 2011-09-01 DIAGNOSIS — E1129 Type 2 diabetes mellitus with other diabetic kidney complication: Secondary | ICD-10-CM | POA: Insufficient documentation

## 2011-09-01 DIAGNOSIS — IMO0001 Reserved for inherently not codable concepts without codable children: Secondary | ICD-10-CM

## 2011-09-01 DIAGNOSIS — E7800 Pure hypercholesterolemia, unspecified: Secondary | ICD-10-CM

## 2011-09-01 DIAGNOSIS — I119 Hypertensive heart disease without heart failure: Secondary | ICD-10-CM

## 2011-09-01 DIAGNOSIS — E119 Type 2 diabetes mellitus without complications: Secondary | ICD-10-CM | POA: Insufficient documentation

## 2011-09-01 NOTE — Patient Instructions (Addendum)
Increase your exercise, join Silver Sneakers  Watch your carbohydrate intake   Your physician recommends that you continue on your current medications as directed. Please refer to the Current Medication list given to you today.  Your physician recommends that you schedule a follow-up appointment in: 4 months with fasting labs (lp/bmet/hfp/a1c)

## 2011-09-01 NOTE — Assessment & Plan Note (Signed)
His blood pressure is borderline high today.  We talked about increasing his losartan strength but he would prefer not to do that because last time it made him dizzy.  Instead he is going to start a regular exercise program with the silver sneakers.  He has some friends in that program.  He will also work harder on weight loss.  At the present time he is not getting any regular exercise.  He has not been having any chest pain or shortness of breath dizziness syncope or palpitations

## 2011-09-01 NOTE — Assessment & Plan Note (Signed)
The patient is on Lipitor in the form of Caduet.  He is not having any myalgias.  His lipid levels are satisfactory

## 2011-09-01 NOTE — Progress Notes (Signed)
Daryl Webb Date of Birth:  Nov 26, 1945 [facility] [address] [place], [place]  [address] [phone]         Fax   [phone]  History of Present Illness: This pleasant 66 year old gentleman is seen for a followup office visit.  He has a history of essential hypertension, hypercholesterolemia, and diabetes.  He also has a past history of prostate cancer and has a urethral stricture.  He has had osteoarthritis and has had a previous right total knee replacement.  He has had problems with exogenous obesity.  Since last visit he has been feeling well.  Current Outpatient Prescriptions  Medication Sig Dispense Refill  . amLODipine-atorvastatin (CADUET) 5-40 MG per tablet TAKE 1 TABLET DAILY  90 tablet  3  . aspirin 81 MG tablet Take 81 mg by mouth daily.        . diclofenac (VOLTAREN) 75 MG EC tablet Take 75 mg by mouth 2 (two) times daily.        Marland Kitchen loratadine (CLARITIN) 10 MG tablet Take 10 mg by mouth daily. As needed       . losartan-hydrochlorothiazide (HYZAAR) 50-12.5 MG per tablet TAKE 1 TABLET DAILY  90 tablet  3  . metFORMIN (GLUCOPHAGE) 500 MG tablet TAKE 2 TABLETS DAILY. DOSE CHANGE  180 tablet  3  . metoprolol (TOPROL-XL) 50 MG 24 hr tablet TAKE 1 TABLET DAILY  90 tablet  3    No Known Allergies  Patient Active Problem List  Diagnosis  . Hypercholesterolemia  . History of prostate cancer  . History of urethral stricture  . H/O: osteoarthritis  . Benign hypertensive heart disease without heart failure  . Diabetes mellitus  . Heart murmur    History  Smoking status  . Former Smoker  . Types: Cigarettes  Smokeless tobacco  . Not on file    History  Alcohol Use     Family History  Problem Relation Age of Onset  . Heart disease Mother   . Heart failure Mother   . Heart disease Father   . Diabetes Father     Review of Systems: Constitutional: no fever chills diaphoresis or fatigue or change in weight.  Head and neck: no  hearing loss, no epistaxis, no photophobia or visual disturbance. Respiratory: No cough, shortness of breath or wheezing. Cardiovascular: No chest pain peripheral edema, palpitations. Gastrointestinal: No abdominal distention, no abdominal pain, no change in bowel habits hematochezia or melena. Genitourinary: No dysuria, no frequency, no urgency, no nocturia. Musculoskeletal:No arthralgias, no back pain, no gait disturbance or myalgias. Neurological: No dizziness, no headaches, no numbness, no seizures, no syncope, no weakness, no tremors. Hematologic: No lymphadenopathy, no easy bruising. Psychiatric: No confusion, no hallucinations, no sleep disturbance.    Physical Exam: Filed Vitals:   09/01/11 0924  BP: 140/88  Pulse: 78   the general appearance reveals a well-developed well-nourished middle-aged gentleman in no distress.Pupils equal and reactive.   Extraocular Movements are full.  There is no scleral icterus.  The mouth and pharynx are normal.  The neck is supple.  The carotids reveal no bruits.  The jugular venous pressure is normal.  The thyroid is not enlarged.  There is no lymphadenopathy.  The chest is clear to percussion and auscultation. There are no rales or rhonchi. Expansion of the chest is symmetrical.  Heart reveals a soft systolic ejection murmur at the base.The abdomen is soft and nontender. Bowel sounds are normal. The liver and spleen are not enlarged.  There Are no abdominal masses. There are no bruits.  The pedal pulses are good.  There is no phlebitis or edema.  There is no cyanosis or clubbing. Strength is normal and symmetrical in all extremities.  There is no lateralizing weakness.  There are no sensory deficits.  The skin is warm and dry.  There is no rash.    Assessment / Plan: Continue same medication.  Work harder on diet and exercise and weight loss.  Recheck in 4 months for office visit EKG and fasting lipid panel hepatic function panel nasal metabolic  panel and A1c

## 2011-09-01 NOTE — Assessment & Plan Note (Signed)
The patient has diabetes.  He continues to need a lot of sweets.  He will try to cut back on this.  We will keep him on his same dose of metformin.  He has had no hypoglycemic episodes

## 2011-09-08 DIAGNOSIS — M7660 Achilles tendinitis, unspecified leg: Secondary | ICD-10-CM | POA: Diagnosis not present

## 2011-09-08 DIAGNOSIS — L03039 Cellulitis of unspecified toe: Secondary | ICD-10-CM | POA: Diagnosis not present

## 2011-10-12 DIAGNOSIS — IMO0002 Reserved for concepts with insufficient information to code with codable children: Secondary | ICD-10-CM | POA: Diagnosis not present

## 2011-10-12 DIAGNOSIS — M1710 Unilateral primary osteoarthritis, unspecified knee: Secondary | ICD-10-CM | POA: Diagnosis not present

## 2011-10-13 DIAGNOSIS — Z8546 Personal history of malignant neoplasm of prostate: Secondary | ICD-10-CM | POA: Diagnosis not present

## 2011-10-13 DIAGNOSIS — R32 Unspecified urinary incontinence: Secondary | ICD-10-CM | POA: Diagnosis not present

## 2011-10-13 DIAGNOSIS — N35919 Unspecified urethral stricture, male, unspecified site: Secondary | ICD-10-CM | POA: Diagnosis not present

## 2011-11-24 DIAGNOSIS — Z23 Encounter for immunization: Secondary | ICD-10-CM | POA: Diagnosis not present

## 2011-12-27 ENCOUNTER — Other Ambulatory Visit (INDEPENDENT_AMBULATORY_CARE_PROVIDER_SITE_OTHER): Payer: Medicare Other

## 2011-12-27 DIAGNOSIS — IMO0001 Reserved for inherently not codable concepts without codable children: Secondary | ICD-10-CM

## 2011-12-27 DIAGNOSIS — I119 Hypertensive heart disease without heart failure: Secondary | ICD-10-CM

## 2011-12-27 LAB — LIPID PANEL
Cholesterol: 135 mg/dL (ref 0–200)
VLDL: 38 mg/dL (ref 0.0–40.0)

## 2011-12-27 LAB — BASIC METABOLIC PANEL
BUN: 19 mg/dL (ref 6–23)
Calcium: 9.7 mg/dL (ref 8.4–10.5)
Creatinine, Ser: 1.3 mg/dL (ref 0.4–1.5)
GFR: 59.08 mL/min — ABNORMAL LOW (ref >60.00)
Glucose, Bld: 183 mg/dL — ABNORMAL HIGH (ref 70–99)

## 2011-12-27 LAB — HEMOGLOBIN A1C: Hgb A1c MFr Bld: 8.1 % — ABNORMAL HIGH (ref 4.6–6.5)

## 2011-12-27 LAB — HEPATIC FUNCTION PANEL
ALT: 37 U/L (ref 0–53)
AST: 32 U/L (ref 0–37)
Albumin: 4.1 g/dL (ref 3.5–5.2)
Alkaline Phosphatase: 66 U/L (ref 39–117)
Bilirubin, Direct: 0.1 mg/dL (ref 0.0–0.3)
Total Bilirubin: 0.7 mg/dL (ref 0.3–1.2)
Total Protein: 7.5 g/dL (ref 6.0–8.3)

## 2011-12-27 NOTE — Progress Notes (Signed)
Quick Note:  Please make copy of labs for patient visit. ______ 

## 2011-12-30 ENCOUNTER — Ambulatory Visit (INDEPENDENT_AMBULATORY_CARE_PROVIDER_SITE_OTHER): Payer: Medicare Other | Admitting: Cardiology

## 2011-12-30 ENCOUNTER — Encounter: Payer: Self-pay | Admitting: Cardiology

## 2011-12-30 VITALS — BP 140/82 | HR 75 | Ht 67.0 in | Wt 187.0 lb

## 2011-12-30 DIAGNOSIS — R011 Cardiac murmur, unspecified: Secondary | ICD-10-CM | POA: Diagnosis not present

## 2011-12-30 DIAGNOSIS — IMO0001 Reserved for inherently not codable concepts without codable children: Secondary | ICD-10-CM | POA: Diagnosis not present

## 2011-12-30 DIAGNOSIS — I119 Hypertensive heart disease without heart failure: Secondary | ICD-10-CM

## 2011-12-30 DIAGNOSIS — E7800 Pure hypercholesterolemia, unspecified: Secondary | ICD-10-CM

## 2011-12-30 MED ORDER — METFORMIN HCL 500 MG PO TABS: ORAL_TABLET | ORAL | Status: DC

## 2011-12-30 NOTE — Patient Instructions (Addendum)
Your physician recommends that you schedule a follow-up appointment in: 4 months with fasting labs (lp/bmet/hfp/A1C)   INCREASE YOUR METFORMIN 500 MG 2 INT HE MORNING AND 1 IN THE EVENING

## 2011-12-30 NOTE — Assessment & Plan Note (Signed)
No chest pain or shortness of breath.  No palpitations.  No dizziness or syncope. 

## 2011-12-30 NOTE — Assessment & Plan Note (Signed)
Patient has a history of hypercholesterolemia.  He is on a somewhat careful diet but unfortunately his weight is up an additional 2 pounds

## 2011-12-30 NOTE — Progress Notes (Signed)
Daryl Webb Date of Birth:  05-18-1945 [facility] [address] [place], [place]  [address] [phone]         Fax   [phone]  History of Present Illness: This pleasant 65 year old gentleman is seen for a followup office visit. He has a history of essential hypertension, hypercholesterolemia, and diabetes. He also has a past history of prostate cancer and has a urethral stricture. He has had osteoarthritis and has had a previous right total knee replacement. He has had problems with exogenous obesity. Since last visit he has been feeling well.  He denies any new cardiac symptoms.  He has had some recent problems with tinnitus possibly decreased hearing.    Current Outpatient Prescriptions  Medication Sig Dispense Refill  . amLODipine-atorvastatin (CADUET) 5-40 MG per tablet TAKE 1 TABLET DAILY  90 tablet  3  . aspirin 81 MG tablet Take 81 mg by mouth daily.        . Coenzyme Q10 (CO Q 10 PO) Take 100 mg by mouth daily.       . diclofenac (VOLTAREN) 75 MG EC tablet Take 75 mg by mouth 2 (two) times daily.        Marland Kitchen loratadine (CLARITIN) 10 MG tablet Take 10 mg by mouth daily. As needed       . losartan-hydrochlorothiazide (HYZAAR) 50-12.5 MG per tablet TAKE 1 TABLET DAILY  90 tablet  3  . metFORMIN (GLUCOPHAGE) 500 MG tablet 2 TABLETS IN THE MORNING AND 1 TABLET IN THE EVENING  270 tablet  3  . metoprolol (TOPROL-XL) 50 MG 24 hr tablet TAKE 1 TABLET DAILY  90 tablet  3  . [DISCONTINUED] metFORMIN (GLUCOPHAGE) 500 MG tablet TAKE 2 TABLETS DAILY. DOSE CHANGE  180 tablet  3    No Known Allergies  Patient Active Problem List  Diagnosis  . Hypercholesterolemia  . History of prostate cancer  . History of urethral stricture  . H/O: osteoarthritis  . Benign hypertensive heart disease without heart failure  . Heart murmur  . Type II or unspecified type diabetes mellitus without mention of complication, uncontrolled    History  Smoking status  .  Former Smoker  . Types: Cigarettes  Smokeless tobacco  . Not on file    History  Alcohol Use     Family History  Problem Relation Age of Onset  . Heart disease Mother   . Heart failure Mother   . Heart disease Father   . Diabetes Father     Review of Systems: Constitutional: no fever chills diaphoresis or fatigue or change in weight.  Head and neck: no hearing loss, no epistaxis, no photophobia or visual disturbance. Respiratory: No cough, shortness of breath or wheezing. Cardiovascular: No chest pain peripheral edema, palpitations. Gastrointestinal: No abdominal distention, no abdominal pain, no change in bowel habits hematochezia or melena. Genitourinary: No dysuria, no frequency, no urgency, no nocturia. Musculoskeletal:No arthralgias, no back pain, no gait disturbance or myalgias. Neurological: No dizziness, no headaches, no numbness, no seizures, no syncope, no weakness, no tremors. Hematologic: No lymphadenopathy, no easy bruising. Psychiatric: No confusion, no hallucinations, no sleep disturbance.    Physical Exam: Filed Vitals:   12/30/11 0839  BP: 140/82  Pulse: 75   the general appearance reveals a well-developed well-nourished gentleman in no distress.The head and neck exam reveals pupils equal and reactive.  Extraocular movements are full.  There is no scleral icterus.  The mouth and pharynx are normal.  The neck is  supple.  The carotids reveal no bruits.  The jugular venous pressure is normal.  The  thyroid is not enlarged.  There is no lymphadenopathy.  The chest is clear to percussion and auscultation.  There are no rales or rhonchi.  Expansion of the chest is symmetrical.  The precordium is quiet.  The first heart sound is normal.  The second heart sound is physiologically split.  There is a grade 1/6 systolic ejection murmur at the aortic area.  No diastolic murmur.  No gallop or rub.  There is no abnormal lift or heave.  The abdomen is soft and nontender.  The  bowel sounds are normal.  The liver and spleen are not enlarged.  There are no abdominal masses.  There are no abdominal bruits.  Extremities reveal good pedal pulses.  There is no phlebitis or edema.  There is no cyanosis or clubbing.  Strength is normal and symmetrical in all extremities.  There is no lateralizing weakness.  There are no sensory deficits.  The skin is warm and dry.  There is no rash.     Assessment / Plan: Continue on same medication except increase metformin up to 1000 mg in the morning and 500 in the evening.  He definitely needs to work harder on diet and weight loss.  Recheck in 4 months for followup office visit lipid panel hepatic function panel nasal metabolic panel and hemoglobin U9W

## 2011-12-30 NOTE — Assessment & Plan Note (Signed)
The patient's hemoglobin A1c is higher and we are increasing his metformin today.

## 2011-12-30 NOTE — Assessment & Plan Note (Signed)
The patient has not had any symptoms referable to his heart murmur

## 2012-02-10 ENCOUNTER — Other Ambulatory Visit: Payer: Self-pay | Admitting: Cardiology

## 2012-02-10 MED ORDER — AMLODIPINE-ATORVASTATIN 5-40 MG PO TABS: 1.0000 | ORAL_TABLET | Freq: Every day | ORAL | Status: DC

## 2012-02-10 MED ORDER — METOPROLOL SUCCINATE ER 50 MG PO TB24: 50.0000 mg | ORAL_TABLET | Freq: Every day | ORAL | Status: DC

## 2012-02-14 ENCOUNTER — Telehealth: Payer: Self-pay | Admitting: Cardiology

## 2012-02-14 DIAGNOSIS — E119 Type 2 diabetes mellitus without complications: Secondary | ICD-10-CM

## 2012-02-14 MED ORDER — METFORMIN HCL 500 MG PO TABS: ORAL_TABLET | ORAL | Status: DC

## 2012-02-14 NOTE — Telephone Encounter (Signed)
Patient needed enough Metformin until mail order comes in, sent to [facility]

## 2012-02-14 NOTE — Telephone Encounter (Signed)
New problem:  Need to discuss with Daryl Webb prior to refill request.  Medication will not be ship until Friday .    Metformin 500 mg.   [facility] [address]

## 2012-04-04 ENCOUNTER — Other Ambulatory Visit (INDEPENDENT_AMBULATORY_CARE_PROVIDER_SITE_OTHER): Payer: Medicare Other

## 2012-04-04 DIAGNOSIS — IMO0001 Reserved for inherently not codable concepts without codable children: Secondary | ICD-10-CM

## 2012-04-04 DIAGNOSIS — I119 Hypertensive heart disease without heart failure: Secondary | ICD-10-CM | POA: Diagnosis not present

## 2012-04-04 DIAGNOSIS — E7800 Pure hypercholesterolemia, unspecified: Secondary | ICD-10-CM | POA: Diagnosis not present

## 2012-04-04 LAB — HEPATIC FUNCTION PANEL
Albumin: 3.9 g/dL (ref 3.5–5.2)
Alkaline Phosphatase: 65 U/L (ref 39–117)
Total Protein: 7 g/dL (ref 6.0–8.3)

## 2012-04-04 LAB — LIPID PANEL
Cholesterol: 111 mg/dL (ref 0–200)
HDL: 32.3 mg/dL — ABNORMAL LOW (ref >39.00)
LDL Cholesterol: 57 mg/dL (ref 0–99)
Total CHOL/HDL Ratio: 3
Triglycerides: 108 mg/dL (ref 0.0–149.0)
VLDL: 21.6 mg/dL (ref 0.0–40.0)

## 2012-04-04 LAB — BASIC METABOLIC PANEL
CO2: 25 mEq/L (ref 19–32)
Chloride: 104 mEq/L (ref 96–112)
Creatinine, Ser: 1.3 mg/dL (ref 0.4–1.5)
Potassium: 4 mEq/L (ref 3.5–5.1)
Sodium: 138 mEq/L (ref 135–145)

## 2012-04-04 LAB — HEMOGLOBIN A1C: Hgb A1c MFr Bld: 8.3 % — ABNORMAL HIGH (ref 4.6–6.5)

## 2012-04-05 NOTE — Progress Notes (Signed)
Quick Note:  Please make copy of labs for patient visit. ______ 

## 2012-04-07 ENCOUNTER — Encounter: Payer: Self-pay | Admitting: Cardiology

## 2012-04-07 ENCOUNTER — Ambulatory Visit (INDEPENDENT_AMBULATORY_CARE_PROVIDER_SITE_OTHER): Payer: Medicare Other | Admitting: Cardiology

## 2012-04-07 VITALS — BP 154/88 | HR 69 | Ht 67.0 in | Wt 186.8 lb

## 2012-04-07 DIAGNOSIS — IMO0001 Reserved for inherently not codable concepts without codable children: Secondary | ICD-10-CM

## 2012-04-07 DIAGNOSIS — E7800 Pure hypercholesterolemia, unspecified: Secondary | ICD-10-CM | POA: Diagnosis not present

## 2012-04-07 DIAGNOSIS — E119 Type 2 diabetes mellitus without complications: Secondary | ICD-10-CM

## 2012-04-07 DIAGNOSIS — I119 Hypertensive heart disease without heart failure: Secondary | ICD-10-CM | POA: Diagnosis not present

## 2012-04-07 NOTE — Patient Instructions (Addendum)
Increase Metformin 1000 mg twice a day   Fasting Lab in 4 months    Your physician wants you to follow-up in: 4 months You will receive a reminder letter in the mail two months in advance. If you don't receive a letter, please call our office to schedule the follow-up appointment.

## 2012-04-07 NOTE — Assessment & Plan Note (Signed)
The patient has lost 1 pound since last visit.  He has not yet gotten into any regular walking exercise program however.  His hemoglobin A1c is slightly higher this time.  We will increase his metformin up to 1000 mg twice a day with meals.  He also needs to avoid carbohydrates better.  He still eats a lot of bread.

## 2012-04-07 NOTE — Assessment & Plan Note (Signed)
The patient is not expressing any chest pain or shortness of breath.  His blood pressure is higher today but I rechecked it and it had come down somewhat.  He does not check his blood pressure at home.  He has been trying to avoid added dietary salt however.  He is getting ready to take a three-week trip to [place] and he intends to do a lot of walking when he is there and then when he returns here of the weather will be better and he will continue to walk

## 2012-04-07 NOTE — Progress Notes (Signed)
Daryl Webb Date of Birth:  1945-07-31 [facility] [address] [place], [place]  [address] [phone]         Fax   [phone]  History of Present Illness: This pleasant 67 year old gentleman is seen for a followup office visit. He has a history of essential hypertension, hypercholesterolemia, and diabetes. He also has a past history of prostate cancer and has a urethral stricture. He has had osteoarthritis and has had a previous right total knee replacement. He has had problems with exogenous obesity. Since last visit he has been feeling well. He denies any new cardiac symptoms.   Current Outpatient Prescriptions  Medication Sig Dispense Refill  . amLODipine-atorvastatin (CADUET) 5-40 MG per tablet Take 1 tablet by mouth daily.  90 tablet  3  . aspirin 81 MG tablet Take 81 mg by mouth daily.        . Coenzyme Q10 (CO Q 10 PO) Take 100 mg by mouth daily.       . diclofenac (VOLTAREN) 75 MG EC tablet Take 75 mg by mouth 2 (two) times daily.        Marland Kitchen loratadine (CLARITIN) 10 MG tablet Take 10 mg by mouth daily. As needed       . losartan-hydrochlorothiazide (HYZAAR) 50-12.5 MG per tablet TAKE 1 TABLET DAILY  90 tablet  3  . metoprolol succinate (TOPROL-XL) 50 MG 24 hr tablet Take 1 tablet (50 mg total) by mouth daily. Take with or immediately following a meal.  90 tablet  3  . metFORMIN (GLUCOPHAGE) 1000 MG tablet Take 1 tablet (1,000 mg total) by mouth 2 (two) times daily with a meal.  60 tablet  6   No current facility-administered medications for this visit.    No Known Allergies  Patient Active Problem List  Diagnosis  . Hypercholesterolemia  . History of prostate cancer  . History of urethral stricture  . H/O: osteoarthritis  . Benign hypertensive heart disease without heart failure  . Heart murmur  . Type II or unspecified type diabetes mellitus without mention of complication, uncontrolled    History  Smoking status  . Former Smoker    . Types: Cigarettes  Smokeless tobacco  . Not on file    History  Alcohol Use     Family History  Problem Relation Age of Onset  . Heart disease Mother   . Heart failure Mother   . Heart disease Father   . Diabetes Father     Review of Systems: Constitutional: no fever chills diaphoresis or fatigue or change in weight.  Head and neck: no hearing loss, no epistaxis, no photophobia or visual disturbance. Respiratory: No cough, shortness of breath or wheezing. Cardiovascular: No chest pain peripheral edema, palpitations. Gastrointestinal: No abdominal distention, no abdominal pain, no change in bowel habits hematochezia or melena. Genitourinary: No dysuria, no frequency, no urgency, no nocturia. Musculoskeletal:No arthralgias, no back pain, no gait disturbance or myalgias. Neurological: No dizziness, no headaches, no numbness, no seizures, no syncope, no weakness, no tremors. Hematologic: No lymphadenopathy, no easy bruising. Psychiatric: No confusion, no hallucinations, no sleep disturbance.    Physical Exam: Filed Vitals:   04/07/12 0927  BP: 154/88  Pulse: 69   the general appearance reveals a well-developed well-nourished gentleman in no distress.The head and neck exam reveals pupils equal and reactive.  Extraocular movements are full.  There is no scleral icterus.  The mouth and pharynx are normal.  The neck is supple.  The carotids  reveal no bruits.  The jugular venous pressure is normal.  The  thyroid is not enlarged.  There is no lymphadenopathy.  The chest is clear to percussion and auscultation.  There are no rales or rhonchi.  Expansion of the chest is symmetrical.  The precordium is quiet.  The first heart sound is normal.  The second heart sound is physiologically split.  There is no murmur gallop rub or click.  There is no abnormal lift or heave.  The abdomen is soft and nontender.  The bowel sounds are normal.  The liver and spleen are not enlarged.  There are no  abdominal masses.  There are no abdominal bruits.  Extremities reveal good pedal pulses.  There is no phlebitis or edema.  There is no cyanosis or clubbing.  Strength is normal and symmetrical in all extremities.  There is no lateralizing weakness.  There are no sensory deficits.  The skin is warm and dry.  There is no rash.     Assessment / Plan: Continue same medication except increase metformin to 1000 mg twice a day.  Recheck in 4 months for followup office visit lipid panel hepatic function panel nasal metabolic panel and A1c

## 2012-04-07 NOTE — Assessment & Plan Note (Signed)
The patient is on Caduet which contains Lipitor.  He is not having any myalgias from Lipitor.  Lipid control remained satisfactory

## 2012-04-24 ENCOUNTER — Other Ambulatory Visit: Payer: Medicare Other

## 2012-04-26 ENCOUNTER — Ambulatory Visit: Payer: Medicare Other | Admitting: Cardiology

## 2012-05-11 ENCOUNTER — Telehealth: Payer: Self-pay | Admitting: Cardiology

## 2012-05-11 DIAGNOSIS — E119 Type 2 diabetes mellitus without complications: Secondary | ICD-10-CM

## 2012-05-11 DIAGNOSIS — E7800 Pure hypercholesterolemia, unspecified: Secondary | ICD-10-CM

## 2012-05-11 MED ORDER — METFORMIN HCL 1000 MG PO TABS: 1000.0000 mg | ORAL_TABLET | Freq: Two times a day (BID) | ORAL | Status: DC

## 2012-05-11 NOTE — Telephone Encounter (Signed)
New Prob    Pt has some questions concerning METFORMIN and dosage change. Would like to speak to nurse.

## 2012-05-11 NOTE — Telephone Encounter (Signed)
Refilled as requested  

## 2012-05-22 DIAGNOSIS — L570 Actinic keratosis: Secondary | ICD-10-CM | POA: Diagnosis not present

## 2012-05-22 DIAGNOSIS — Z85828 Personal history of other malignant neoplasm of skin: Secondary | ICD-10-CM | POA: Diagnosis not present

## 2012-05-22 DIAGNOSIS — L821 Other seborrheic keratosis: Secondary | ICD-10-CM | POA: Diagnosis not present

## 2012-06-19 ENCOUNTER — Telehealth: Payer: Self-pay | Admitting: Cardiology

## 2012-06-19 ENCOUNTER — Ambulatory Visit (INDEPENDENT_AMBULATORY_CARE_PROVIDER_SITE_OTHER): Payer: Medicare Other | Admitting: Cardiology

## 2012-06-19 ENCOUNTER — Ambulatory Visit (INDEPENDENT_AMBULATORY_CARE_PROVIDER_SITE_OTHER)
Admission: RE | Admit: 2012-06-19 | Discharge: 2012-06-19 | Disposition: A | Discharge status: Home / Self Care | Payer: Medicare Other | Source: Ambulatory Visit | Attending: Cardiology | Admitting: Cardiology

## 2012-06-19 ENCOUNTER — Encounter: Payer: Self-pay | Admitting: Cardiology

## 2012-06-19 VITALS — BP 142/74 | HR 68 | Ht 67.5 in | Wt 185.6 lb

## 2012-06-19 DIAGNOSIS — K921 Melena: Secondary | ICD-10-CM

## 2012-06-19 DIAGNOSIS — R0989 Other specified symptoms and signs involving the circulatory and respiratory systems: Secondary | ICD-10-CM

## 2012-06-19 DIAGNOSIS — R0600 Dyspnea, unspecified: Secondary | ICD-10-CM

## 2012-06-19 DIAGNOSIS — R0602 Shortness of breath: Secondary | ICD-10-CM | POA: Diagnosis not present

## 2012-06-19 DIAGNOSIS — R0609 Other forms of dyspnea: Secondary | ICD-10-CM

## 2012-06-19 DIAGNOSIS — I10 Essential (primary) hypertension: Secondary | ICD-10-CM | POA: Diagnosis not present

## 2012-06-19 HISTORY — DX: Dyspnea, unspecified: R06.00

## 2012-06-19 HISTORY — DX: Melena: K92.1

## 2012-06-19 MED ORDER — OMEPRAZOLE 40 MG PO CPDR: 40.0000 mg | DELAYED_RELEASE_CAPSULE | Freq: Two times a day (BID) | ORAL | Status: DC

## 2012-06-19 NOTE — Telephone Encounter (Signed)
New problem    No information was not disclose will discuss when the nurse called back

## 2012-06-19 NOTE — Progress Notes (Signed)
Daryl Webb Date of Birth:  Mar 02, 1945 [facility] [address] [place], [place]  [address] [phone]         Fax   [phone]  History of Present Illness: This pleasant 67 year old gentleman is seen for a work in office visit.  He comes in because of complaints of weakness and dizziness and shortness of breath.  He has a history of essential hypertension, hypercholesterolemia, and diabetes. He also has a past history of prostate cancer and has a urethral stricture. He has had osteoarthritis and has had a previous right total knee replacement. He has had problems with exogenous obesity.  He was in his usual state of health until about a week ago when he was on a vacation trip to [place].  He noticed that his stamina for walking was decreased and he was short of breath with exertion.  5 nights ago he got up in the middle of the night at home 2 urinary and had near syncope and marked weakness.  His wife has noted that he has appeared to be pale and his skin has been  Clammy.  For about the past 5 days or so he has noted dark stools and he has been eating a lot of TUMS because of increased epigastric pain and dyspepsia.  He has not had hematemesis.  He has been on long-term Voltaren 75 mg twice a day because of shoulder pain and also spinal stenosis.  He has not been having any chest pain or angina pectoris.   Current Outpatient Prescriptions  Medication Sig Dispense Refill  . amLODipine-atorvastatin (CADUET) 5-40 MG per tablet Take 1 tablet by mouth daily.  90 tablet  3  . Coenzyme Q10 (CO Q 10 PO) Take 100 mg by mouth daily.       Marland Kitchen loratadine (CLARITIN) 10 MG tablet Take 10 mg by mouth daily. As needed       . losartan-hydrochlorothiazide (HYZAAR) 50-12.5 MG per tablet TAKE 1 TABLET DAILY  90 tablet  3  . metFORMIN (GLUCOPHAGE) 1000 MG tablet Take 1 tablet (1,000 mg total) by mouth 2 (two) times daily with a meal.  180 tablet  3  . metoprolol succinate  (TOPROL-XL) 50 MG 24 hr tablet Take 1 tablet (50 mg total) by mouth daily. Take with or immediately following a meal.  90 tablet  3  . omeprazole (PRILOSEC) 40 MG capsule Take 1 capsule (40 mg total) by mouth 2 (two) times daily.  60 capsule  5   No current facility-administered medications for this visit.    No Known Allergies  Patient Active Problem List   Diagnosis Date Noted  . Melena 06/19/2012  . Dyspnea 06/19/2012  . Type II or unspecified type diabetes mellitus without mention of complication, uncontrolled 09/01/2011  . Heart murmur 04/14/2011  . Benign hypertensive heart disease without heart failure 08/17/2010  . Hypercholesterolemia   . History of prostate cancer   . History of urethral stricture   . H/O: osteoarthritis     History  Smoking status  . Former Smoker  . Types: Cigarettes  Smokeless tobacco  . Not on file    History  Alcohol Use     Family History  Problem Relation Age of Onset  . Heart disease Mother   . Heart failure Mother   . Heart disease Father   . Diabetes Father     Review of Systems: Constitutional: no fever chills diaphoresis or fatigue or change in weight.  Head and neck: no hearing loss, no epistaxis, no photophobia or visual disturbance. Respiratory: No cough, shortness of breath or wheezing. Cardiovascular: No chest pain peripheral edema, palpitations. Gastrointestinal: No abdominal distention, no abdominal pain, no change in bowel habits hematochezia.  Patient has noted melena Genitourinary: No dysuria, no frequency, no urgency, no nocturia. Musculoskeletal:No arthralgias, no back pain, no gait disturbance or myalgias. Neurological: No dizziness, no headaches, no numbness, no seizures, no syncope, no weakness, no tremors. Hematologic: No lymphadenopathy, no easy bruising. Psychiatric: No confusion, no hallucinations, no sleep disturbance.    Physical Exam: Filed Vitals:   06/19/12 1511  BP: 142/74  Pulse: 68   the  general appearance reveals a slightly pale middle-aged gentleman in no distress.The head and neck exam reveals pupils equal and reactive.  Extraocular movements are full.  There is no scleral icterus.  The mouth and pharynx are normal.  The neck is supple.  The carotids reveal no bruits.  The jugular venous pressure is normal.  The  thyroid is not enlarged.  There is no lymphadenopathy.  The chest is clear to percussion and auscultation.  There are no rales or rhonchi.  Expansion of the chest is symmetrical.  The precordium is quiet.  The first heart sound is normal.  The second heart sound is physiologically split.  There is no murmur gallop rub or click.  There is no abnormal lift or heave.  The abdomen is soft and nontender.  The bowel sounds are normal.  The liver and spleen are not enlarged.  There are no abdominal masses.  There are no abdominal bruits.  Extremities reveal good pedal pulses.  There is no phlebitis or edema.  There is no cyanosis or clubbing.  Strength is normal and symmetrical in all extremities.  There is no lateralizing weakness.  There are no sensory deficits.  The skin is warm and dry.  There is no rash.  EKG shows sinus rhythm with marked sinus arrhythmia with left axis deviation, and nonspecific ST-T wave changes which are unchanged since 04/14/11   Assessment / Plan: I am concerned that the patient may have a GI bleed and may be having weakness and dyspnea secondary to anemia.  We will check a chest x-ray today we will obtain these CBC and a basal metabolic.  We will stop his Voltaren and his aspirin the time being.  We will start him and currently on omeprazole 40 mg twice a day.  Depending on clinical course he may need referral to GI.

## 2012-06-19 NOTE — Telephone Encounter (Signed)
Patient woke up in the middle of the night last week feeling like he was going to pass out and has been having shortness of breath with decreased stamina. Scheduled appointment to see  Dr. [name] Sermons today

## 2012-06-19 NOTE — Patient Instructions (Addendum)
Will obtain labs today and call you with the results (cbc/bmet)  Would like for you to go soon for a chest xray to the [facility] building across from [facility]  STOP ASPIRIN AND VOLTAREN  START OMEPRAZOLE 40 MG TWICE A DAY

## 2012-06-20 LAB — BASIC METABOLIC PANEL
BUN: 26 mg/dL — ABNORMAL HIGH (ref 6–23)
CO2: 29 mEq/L (ref 19–32)
Chloride: 107 mEq/L (ref 96–112)
Creatinine, Ser: 1.2 mg/dL (ref 0.4–1.5)
Potassium: 4 mEq/L (ref 3.5–5.1)

## 2012-06-20 LAB — CBC WITH DIFFERENTIAL/PLATELET
Basophils Relative: 0.6 % (ref 0.0–3.0)
Hemoglobin: 10 g/dL — ABNORMAL LOW (ref 13.0–17.0)
Lymphocytes Relative: 30.7 % (ref 12.0–46.0)
Monocytes Relative: 7.4 % (ref 3.0–12.0)
Neutro Abs: 5.8 10*3/uL (ref 1.4–7.7)
Neutrophils Relative %: 57.6 % (ref 43.0–77.0)
RBC: 3 Mil/uL — ABNORMAL LOW (ref 4.22–5.81)
WBC: 10.1 10*3/uL (ref 4.5–10.5)

## 2012-06-21 ENCOUNTER — Encounter: Payer: Self-pay | Admitting: Gastroenterology

## 2012-06-21 ENCOUNTER — Telehealth: Payer: Self-pay | Admitting: *Deleted

## 2012-06-21 DIAGNOSIS — D649 Anemia, unspecified: Secondary | ICD-10-CM

## 2012-06-21 DIAGNOSIS — K921 Melena: Secondary | ICD-10-CM

## 2012-06-21 NOTE — Telephone Encounter (Signed)
Message copied by Burnell Blanks on Wed Jun 21, 2012  6:00 PM ------      Message from: Cassell Clement      Created: Mon Jun 19, 2012  8:50 PM       Please report.  Chest xray shows borderline cardiomegaly but no acute process. ------

## 2012-06-21 NOTE — Telephone Encounter (Signed)
Advised patient of appointment and to go to ED if worse.

## 2012-06-21 NOTE — Telephone Encounter (Signed)
Message copied by Burnell Blanks on Wed Jun 21, 2012  6:00 PM ------      Message from: Cassell Clement      Created: Tue Jun 20, 2012  8:40 PM       I gave Mr. Tanguma his results.  Hgb down to 10.0 from 15.6 several years ago. He had a colonoscopy last year with Dr. Arlyce Dice ([facility]). Please get him in to see one of the  GI docs or PAs ASAP regarding his melena and anemia. ------

## 2012-06-21 NOTE — Telephone Encounter (Signed)
Message copied by Burnell Blanks on Wed Jun 21, 2012 12:29 PM ------      Message from: Cassell Clement      Created: Tue Jun 20, 2012  8:40 PM       I gave Daryl Webb his results.  Hgb down to 10.0 from 15.6 several years ago. He had a colonoscopy last year with Dr. Arlyce Dice ([facility]). Please get him in to see one of the [facility] docs or PAs ASAP regarding his melena and anemia. ------

## 2012-06-26 DIAGNOSIS — L570 Actinic keratosis: Secondary | ICD-10-CM | POA: Diagnosis not present

## 2012-06-27 ENCOUNTER — Encounter: Payer: Self-pay | Admitting: Gastroenterology

## 2012-06-27 ENCOUNTER — Ambulatory Visit (INDEPENDENT_AMBULATORY_CARE_PROVIDER_SITE_OTHER): Payer: Medicare Other | Admitting: Gastroenterology

## 2012-06-27 VITALS — BP 138/80 | HR 72 | Ht 67.0 in | Wt 184.8 lb

## 2012-06-27 DIAGNOSIS — D62 Acute posthemorrhagic anemia: Secondary | ICD-10-CM | POA: Diagnosis not present

## 2012-06-27 DIAGNOSIS — K921 Melena: Secondary | ICD-10-CM | POA: Diagnosis not present

## 2012-06-27 HISTORY — DX: Acute posthemorrhagic anemia: D62

## 2012-06-27 MED ORDER — FERROUS SULFATE DRIED 200 (65 FE) MG PO TABS: ORAL_TABLET | ORAL | Status: DC

## 2012-06-27 NOTE — Assessment & Plan Note (Addendum)
By history patient had a melenic stool and a drop in hemoglobin. I suspect that he has an NSAID-related ulcer.  Recommendations #1 continue omeprazole #2 avoid NSAIDs #3 begin iron supplementation #4 upper endoscopy

## 2012-06-27 NOTE — Assessment & Plan Note (Signed)
And to begin iron supplementation

## 2012-06-27 NOTE — Patient Instructions (Addendum)

## 2012-06-27 NOTE — Progress Notes (Signed)
History of Present Illness: Pleasant 67 year old white male referred at the request of Dr. [name] Sermons for evaluation of anemia. Approximally a week ago he had an episode of lightheadedness and diaphoresis. He had noted black loose stools. When seen in Dr. Yevonne Pax office blood work was pertinent for hemoglobin of 10. In 2009 it was 15.6. Volterim was discontinued and he was placed on twice a day omeprazole. Stools have subsequently normalized in color. He still complains of some weakness and dyspnea on exertion. A hyperplastic polyp was removed at colonoscopy in 2012.    Past Medical History  Diagnosis Date  . Hypertension   . Diabetes mellitus   . Hypercholesterolemia   . History of prostate cancer   . History of urethral stricture   . H/O: osteoarthritis    Past Surgical History  Procedure Laterality Date  . Vasectomy  1985  . Carpal tunnel release      right hand,wrist Dr. Teressa Senter  . Arthroplasty  2004    right knee Dr. Lequita Halt  . Prostatectomy      radical   family history includes Diabetes in his father; Heart disease in his father and mother; and Heart failure in his mother. Current Outpatient Prescriptions  Medication Sig Dispense Refill  . amLODipine-atorvastatin (CADUET) 5-40 MG per tablet Take 1 tablet by mouth daily.  90 tablet  3  . Coenzyme Q10 (CO Q 10 PO) Take 100 mg by mouth daily.       Marland Kitchen loratadine (CLARITIN) 10 MG tablet Take 10 mg by mouth daily. As needed       . losartan-hydrochlorothiazide (HYZAAR) 50-12.5 MG per tablet TAKE 1 TABLET DAILY  90 tablet  3  . metFORMIN (GLUCOPHAGE) 1000 MG tablet Take 1 tablet (1,000 mg total) by mouth 2 (two) times daily with a meal.  180 tablet  3  . metoprolol succinate (TOPROL-XL) 50 MG 24 hr tablet Take 1 tablet (50 mg total) by mouth daily. Take with or immediately following a meal.  90 tablet  3  . omeprazole (PRILOSEC) 40 MG capsule Take 1 capsule (40 mg total) by mouth 2 (two) times daily.  60 capsule  5   No current  facility-administered medications for this visit.   Allergies as of 06/27/2012  . (No Known Allergies)    reports that he has quit smoking. His smoking use included Cigarettes. He smoked 0.00 packs per day. He has never used smokeless tobacco. He reports that he does not drink alcohol or use illicit drugs.     Review of Systems: Complains of low back pain Pertinent positive and negative review of systems were noted in the above HPI section. All other review of systems were otherwise negative.  Vital signs were reviewed in today's medical record Physical Exam: General: Well developed , well nourished, no acute distress Skin: anicteric Head: Normocephalic and atraumatic Eyes:  sclerae anicteric, EOMI Ears: Normal auditory acuity Mouth: No deformity or lesions Neck: Supple, no masses or thyromegaly Lungs: Clear throughout to auscultation Heart: Regular rate and rhythm; no murmurs, rubs or bruits Abdomen: Soft, non tender and non distended. No masses, hepatosplenomegaly or hernias noted. Normal Bowel sounds Rectal:deferred Musculoskeletal: Symmetrical with no gross deformities  Skin: No lesions on visible extremities Pulses:  Normal pulses noted Extremities: No clubbing, cyanosis, edema or deformities noted Neurological: Alert oriented x 4, grossly nonfocal Cervical Nodes:  No significant cervical adenopathy Inguinal Nodes: No significant inguinal adenopathy Psychological:  Alert and cooperative. Normal mood and affect

## 2012-06-28 ENCOUNTER — Ambulatory Visit (AMBULATORY_SURGERY_CENTER): Payer: Medicare Other | Admitting: Gastroenterology

## 2012-06-28 ENCOUNTER — Other Ambulatory Visit: Payer: Self-pay | Admitting: Gastroenterology

## 2012-06-28 ENCOUNTER — Encounter: Payer: Self-pay | Admitting: Gastroenterology

## 2012-06-28 VITALS — BP 130/84 | HR 67 | Temp 98.1°F | Resp 21 | Ht 67.0 in | Wt 184.0 lb

## 2012-06-28 DIAGNOSIS — I10 Essential (primary) hypertension: Secondary | ICD-10-CM | POA: Diagnosis not present

## 2012-06-28 DIAGNOSIS — D649 Anemia, unspecified: Secondary | ICD-10-CM

## 2012-06-28 DIAGNOSIS — K264 Chronic or unspecified duodenal ulcer with hemorrhage: Secondary | ICD-10-CM

## 2012-06-28 DIAGNOSIS — E119 Type 2 diabetes mellitus without complications: Secondary | ICD-10-CM | POA: Diagnosis not present

## 2012-06-28 DIAGNOSIS — K2980 Duodenitis without bleeding: Secondary | ICD-10-CM | POA: Diagnosis not present

## 2012-06-28 DIAGNOSIS — K921 Melena: Secondary | ICD-10-CM

## 2012-06-28 LAB — GLUCOSE, CAPILLARY
Glucose-Capillary: 174 mg/dL — ABNORMAL HIGH (ref 70–99)
Glucose-Capillary: 151 mg/dL — ABNORMAL HIGH (ref 70–99)

## 2012-06-28 MED ORDER — SODIUM CHLORIDE 0.9 % IV SOLN: 500.0000 mL | INTRAVENOUS | Status: DC

## 2012-06-28 NOTE — Op Note (Signed)
[facility] [address] [place], [address]   ENDOSCOPY PROCEDURE REPORT  PATIENT: Daryl Webb, Daryl Webb  MR#: [id number] BIRTHDATE: 1945-03-15 , 67  yrs. old GENDER: Male ENDOSCOPIST: Louis Meckel, MD REFERRED BY:  Cassell Clement, M.D. PROCEDURE DATE:  06/28/2012 PROCEDURE:  EGD w/ biopsy ASA CLASS:     Class II INDICATIONS: MEDICATIONS: MAC sedation, administered by CRNA, propofol (Diprivan) 100mg  IV, and Simethicone 0.6cc PO TOPICAL ANESTHETIC: Cetacaine Spray  DESCRIPTION OF PROCEDURE: After the risks benefits and alternatives of the procedure were thoroughly explained, informed consent was obtained.  The LB [id number] endoscope was introduced through the mouth and advanced to the third portion of the duodenum. Without limitations.  The instrument was slowly withdrawn as the mucosa was fully examined.      At the apex of the duodenal bulb there is a 3-4 mm clean-based ulcer.  There was minimal surrounding mucosal edema.  Biopsies were taken to rule out H.  pylori.   The remainder of the upper endoscopy exam was otherwise normal.  Retroflexed views revealed no abnormalities.     The scope was then withdrawn from the patient and the procedure completed.  COMPLICATIONS: There were no complications. ENDOSCOPIC IMPRESSION: 1.   active duodenal ulcer  RECOMMENDATIONS: 1.  continue twice a day for 7 more days then reduce to once daily 2.  avoid NSAIDs REPEAT EXAM:  eSigned:  Louis Meckel, MD 06/28/2012 2:08 PM   CC:

## 2012-06-28 NOTE — Progress Notes (Signed)
Called to room to assist during endoscopic procedure.  Patient ID and intended procedure confirmed with present staff. Received instructions for my participation in the procedure from the performing physician. ewm 

## 2012-06-28 NOTE — Progress Notes (Signed)
Patient did not experience any of the following events: a burn prior to discharge; a fall within the facility; wrong site/side/patient/procedure/implant event; or a hospital transfer or hospital admission upon discharge from the facility. (G8907) Patient did not have preoperative order for IV antibiotic SSI prophylaxis. (G8918)  

## 2012-06-28 NOTE — Patient Instructions (Addendum)
Discharge instructions given with verbal understanding. Biopsies taken. Resume previous medications. YOU HAD AN ENDOSCOPIC PROCEDURE TODAY AT THE [facility]: Refer to the procedure report that was given to you for any specific questions about what was found during the examination.  If the procedure report does not answer your questions, please call your gastroenterologist to clarify.  If you requested that your care partner not be given the details of your procedure findings, then the procedure report has been included in a sealed envelope for you to review at your convenience later.  YOU SHOULD EXPECT: Some feelings of bloating in the abdomen. Passage of more gas than usual.  Walking can help get rid of the air that was put into your GI tract during the procedure and reduce the bloating. If you had a lower endoscopy (such as a colonoscopy or flexible sigmoidoscopy) you may notice spotting of blood in your stool or on the toilet paper. If you underwent a bowel prep for your procedure, then you may not have a normal bowel movement for a few days.  DIET: Your first meal following the procedure should be a light meal and then it is ok to progress to your normal diet.  A half-sandwich or bowl of soup is an example of a good first meal.  Heavy or fried foods are harder to digest and may make you feel nauseous or bloated.  Likewise meals heavy in dairy and vegetables can cause extra gas to form and this can also increase the bloating.  Drink plenty of fluids but you should avoid alcoholic beverages for 24 hours.  ACTIVITY: Your care partner should take you home directly after the procedure.  You should plan to take it easy, moving slowly for the rest of the day.  You can resume normal activity the day after the procedure however you should NOT DRIVE or use heavy machinery for 24 hours (because of the sedation medicines used during the test).    SYMPTOMS TO REPORT IMMEDIATELY: A gastroenterologist  can be reached at any hour.  During normal business hours, 8:30 AM to 5:00 PM Monday through Friday, call [phone].  After hours and on weekends, please call the GI answering service at [phone] who will take a message and have the physician on call contact you.   Following upper endoscopy (EGD)  Vomiting of blood or coffee ground material  New chest pain or pain under the shoulder blades  Painful or persistently difficult swallowing  New shortness of breath  Fever of 100F or higher  Black, tarry-looking stools  FOLLOW UP: If any biopsies were taken you will be contacted by phone or by letter within the next 1-3 weeks.  Call your gastroenterologist if you have not heard about the biopsies in 3 weeks.  Our staff will call the home number listed on your records the next business day following your procedure to check on you and address any questions or concerns that you may have at that time regarding the information given to you following your procedure. This is a courtesy call and so if there is no answer at the home number and we have not heard from you through the emergency physician on call, we will assume that you have returned to your regular daily activities without incident.  SIGNATURES/CONFIDENTIALITY: You and/or your care partner have signed paperwork which will be entered into your electronic medical record.  These signatures attest to the fact that that the information above on your After   Visit Summary has been reviewed and is understood.  Full responsibility of the confidentiality of this discharge information lies with you and/or your care-partner. 

## 2012-06-29 ENCOUNTER — Telehealth: Payer: Self-pay | Admitting: *Deleted

## 2012-06-29 NOTE — Telephone Encounter (Signed)
  Follow up Call-  Call back number 06/28/2012  Post procedure Call Back phone  # [phone]  Permission to leave phone message Yes     Patient questions:  Do you have a fever, pain , or abdominal swelling? no Pain Score  0 *  Have you tolerated food without any problems? yes  Have you been able to return to your normal activities? yes  Do you have any questions about your discharge instructions: Diet   no Medications  no Follow up visit  no  Do you have questions or concerns about your Care? no  Actions: * If pain score is 4 or above: No action needed, pain <4.

## 2012-07-07 ENCOUNTER — Telehealth: Payer: Self-pay | Admitting: Cardiology

## 2012-07-07 DIAGNOSIS — D649 Anemia, unspecified: Secondary | ICD-10-CM

## 2012-07-07 NOTE — Telephone Encounter (Signed)
Discussed with  Dr. [name] Sermons and he would like to see patient in about 3 weeks, scheduled ov and labs

## 2012-07-07 NOTE — Telephone Encounter (Signed)
New problem   Pt wants to know when he needs to f/u w/dr brackbill-was not told at last visit

## 2012-07-11 DIAGNOSIS — N35919 Unspecified urethral stricture, male, unspecified site: Secondary | ICD-10-CM | POA: Diagnosis not present

## 2012-07-11 DIAGNOSIS — R82998 Other abnormal findings in urine: Secondary | ICD-10-CM | POA: Diagnosis not present

## 2012-07-12 ENCOUNTER — Encounter: Payer: Self-pay | Admitting: Gastroenterology

## 2012-07-18 ENCOUNTER — Other Ambulatory Visit (INDEPENDENT_AMBULATORY_CARE_PROVIDER_SITE_OTHER): Payer: Medicare Other

## 2012-07-18 DIAGNOSIS — E7800 Pure hypercholesterolemia, unspecified: Secondary | ICD-10-CM | POA: Diagnosis not present

## 2012-07-18 DIAGNOSIS — E119 Type 2 diabetes mellitus without complications: Secondary | ICD-10-CM | POA: Diagnosis not present

## 2012-07-18 DIAGNOSIS — D649 Anemia, unspecified: Secondary | ICD-10-CM

## 2012-07-18 LAB — CBC WITH DIFFERENTIAL/PLATELET
Basophils Absolute: 0 10*3/uL (ref 0.0–0.1)
HCT: 38.6 % — ABNORMAL LOW (ref 39.0–52.0)
Lymphs Abs: 2.7 10*3/uL (ref 0.7–4.0)
Monocytes Absolute: 0.7 10*3/uL (ref 0.1–1.0)
Monocytes Relative: 7.8 % (ref 3.0–12.0)
Platelets: 291 10*3/uL (ref 150.0–400.0)
RDW: 14.9 % — ABNORMAL HIGH (ref 11.5–14.6)

## 2012-07-18 LAB — LIPID PANEL
HDL: 30.4 mg/dL — ABNORMAL LOW (ref >39.00)
Triglycerides: 178 mg/dL — ABNORMAL HIGH (ref 0.0–149.0)

## 2012-07-18 LAB — BASIC METABOLIC PANEL
Calcium: 9.5 mg/dL (ref 8.4–10.5)
GFR: 60.61 mL/min (ref >60.00)
Glucose, Bld: 177 mg/dL — ABNORMAL HIGH (ref 70–99)
Sodium: 139 mEq/L (ref 135–145)

## 2012-07-18 LAB — HEPATIC FUNCTION PANEL
ALT: 34 U/L (ref 0–53)
AST: 25 U/L (ref 0–37)
Total Bilirubin: 0.5 mg/dL (ref 0.3–1.2)

## 2012-07-18 LAB — HEMOGLOBIN A1C: Hgb A1c MFr Bld: 7.4 % — ABNORMAL HIGH (ref 4.6–6.5)

## 2012-07-19 NOTE — Progress Notes (Signed)
Quick Note:  Preliminary report reviewed by triage nurse and sent to MD desk./PE   ______ 

## 2012-07-19 NOTE — Progress Notes (Signed)
Quick Note:  Please make copy of labs for patient visit. ______ 

## 2012-07-20 ENCOUNTER — Ambulatory Visit (INDEPENDENT_AMBULATORY_CARE_PROVIDER_SITE_OTHER): Payer: Medicare Other | Admitting: Cardiology

## 2012-07-20 ENCOUNTER — Encounter: Payer: Self-pay | Admitting: Cardiology

## 2012-07-20 VITALS — BP 134/76 | HR 80 | Ht 67.0 in | Wt 186.0 lb

## 2012-07-20 DIAGNOSIS — M545 Low back pain, unspecified: Secondary | ICD-10-CM | POA: Diagnosis not present

## 2012-07-20 DIAGNOSIS — K921 Melena: Secondary | ICD-10-CM

## 2012-07-20 DIAGNOSIS — D500 Iron deficiency anemia secondary to blood loss (chronic): Secondary | ICD-10-CM | POA: Insufficient documentation

## 2012-07-20 DIAGNOSIS — IMO0001 Reserved for inherently not codable concepts without codable children: Secondary | ICD-10-CM

## 2012-07-20 NOTE — Assessment & Plan Note (Signed)
The patient has had no recurrence of melena.  His stools are normal color despite being on over-the-counter iron

## 2012-07-20 NOTE — Assessment & Plan Note (Signed)
The patient has a history of severe low back pain.  He saw Dr. Newell Coral about this many years ago.  The back pain is worse and extends down into his buttocks but not down into his leg.  He has difficulty standing up straight and in the morning in particular he is quite stiff in his lower back.  We will refer him back to Dr. Newell Coral for further evaluation.  He may have spinal stenosis.

## 2012-07-20 NOTE — Progress Notes (Signed)
Daryl Webb Date of Birth:  09-03-1945 [facility] [address] [place], [place]  [address] [phone]         Fax   [phone]  History of Present Illness: This pleasant 67 year old gentleman is seen for a followup office visit. He has a history of essential hypertension, hypercholesterolemia, and diabetes. He also has a past history of prostate cancer and has a urethral stricture. He has had osteoarthritis and has had a previous right total knee replacement. He has had problems with exogenous obesity.  At his last visit he was found to have iron deficiency anemia and a history of melena and was subsequently endoscoped and was found to have a peptic ulcer.  He has had a good response to omeprazole.  The patient has also been experiencing severe low back pain.      Current Outpatient Prescriptions  Medication Sig Dispense Refill  . amLODipine-atorvastatin (CADUET) 5-40 MG per tablet Take 1 tablet by mouth daily.  90 tablet  3  . aspirin EC 81 MG tablet Take 81 mg by mouth daily.      . Coenzyme Q10 (CO Q 10 PO) Take 100 mg by mouth daily.       . Ferrous Sulfate Dried (FEOSOL) 200 (65 FE) MG TABS Take one tab twice a day  60 tablet  1  . loratadine (CLARITIN) 10 MG tablet Take 10 mg by mouth daily. As needed       . losartan-hydrochlorothiazide (HYZAAR) 50-12.5 MG per tablet TAKE 1 TABLET DAILY  90 tablet  3  . metFORMIN (GLUCOPHAGE) 1000 MG tablet Take 1 tablet (1,000 mg total) by mouth 2 (two) times daily with a meal.  180 tablet  3  . metoprolol succinate (TOPROL-XL) 50 MG 24 hr tablet Take 1 tablet (50 mg total) by mouth daily. Take with or immediately following a meal.  90 tablet  3  . omeprazole (PRILOSEC) 40 MG capsule Take 40 mg by mouth daily.       No current facility-administered medications for this visit.    No Known Allergies  Patient Active Problem List   Diagnosis Date Noted  . Low back pain 07/20/2012  . Iron deficiency anemia due  to chronic blood loss 07/20/2012  . Acute posthemorrhagic anemia 06/27/2012  . Melena 06/19/2012  . Dyspnea 06/19/2012  . Type II or unspecified type diabetes mellitus without mention of complication, uncontrolled 09/01/2011  . Heart murmur 04/14/2011  . Benign hypertensive heart disease without heart failure 08/17/2010  . Hypercholesterolemia   . History of prostate cancer   . History of urethral stricture   . H/O: osteoarthritis     History  Smoking status  . Former Smoker  . Types: Cigarettes  Smokeless tobacco  . Never Used    History  Alcohol Use No    Family History  Problem Relation Age of Onset  . Heart disease Mother   . Heart failure Mother   . Heart disease Father   . Diabetes Father     Review of Systems: Constitutional: no fever chills diaphoresis or fatigue or change in weight.  Head and neck: no hearing loss, no epistaxis, no photophobia or visual disturbance. Respiratory: No cough, shortness of breath or wheezing. Cardiovascular: No chest pain peripheral edema, palpitations. Gastrointestinal: No abdominal distention, no abdominal pain, no change in bowel habits hematochezia or melena. Genitourinary: No dysuria, no frequency, no urgency, no nocturia. Musculoskeletal:No arthralgias, no back pain, no gait disturbance or myalgias. Neurological:  No dizziness, no headaches, no numbness, no seizures, no syncope, no weakness, no tremors. Hematologic: No lymphadenopathy, no easy bruising. Psychiatric: No confusion, no hallucinations, no sleep disturbance.    Physical Exam: Filed Vitals:   07/20/12 0850  BP: 134/76  Pulse: 80     Assessment / Plan:      Current Outpatient Prescriptions  Medication Sig Dispense Refill  . amLODipine-atorvastatin (CADUET) 5-40 MG per tablet Take 1 tablet by mouth daily.  90 tablet  3  . aspirin EC 81 MG tablet Take 81 mg by mouth daily.      . Coenzyme Q10 (CO Q 10 PO) Take 100 mg by mouth daily.       . Ferrous  Sulfate Dried (FEOSOL) 200 (65 FE) MG TABS Take one tab twice a day  60 tablet  1  . loratadine (CLARITIN) 10 MG tablet Take 10 mg by mouth daily. As needed       . losartan-hydrochlorothiazide (HYZAAR) 50-12.5 MG per tablet TAKE 1 TABLET DAILY  90 tablet  3  . metFORMIN (GLUCOPHAGE) 1000 MG tablet Take 1 tablet (1,000 mg total) by mouth 2 (two) times daily with a meal.  180 tablet  3  . metoprolol succinate (TOPROL-XL) 50 MG 24 hr tablet Take 1 tablet (50 mg total) by mouth daily. Take with or immediately following a meal.  90 tablet  3  . omeprazole (PRILOSEC) 40 MG capsule Take 40 mg by mouth daily.       No current facility-administered medications for this visit.    No Known Allergies  Patient Active Problem List   Diagnosis Date Noted  . Low back pain 07/20/2012  . Iron deficiency anemia due to chronic blood loss 07/20/2012  . Acute posthemorrhagic anemia 06/27/2012  . Melena 06/19/2012  . Dyspnea 06/19/2012  . Type II or unspecified type diabetes mellitus without mention of complication, uncontrolled 09/01/2011  . Heart murmur 04/14/2011  . Benign hypertensive heart disease without heart failure 08/17/2010  . Hypercholesterolemia   . History of prostate cancer   . History of urethral stricture   . H/O: osteoarthritis     History  Smoking status  . Former Smoker  . Types: Cigarettes  Smokeless tobacco  . Never Used    History  Alcohol Use No    Family History  Problem Relation Age of Onset  . Heart disease Mother   . Heart failure Mother   . Heart disease Father   . Diabetes Father     Review of Systems: Constitutional: no fever chills diaphoresis or fatigue or change in weight.  Head and neck: no hearing loss, no epistaxis, no photophobia or visual disturbance. Respiratory: No cough, shortness of breath or wheezing. Cardiovascular: No chest pain peripheral edema, palpitations. Gastrointestinal: No abdominal distention, no abdominal pain, no change in bowel  habits hematochezia or melena. Genitourinary: No dysuria, no frequency, no urgency, no nocturia. Musculoskeletal:No arthralgias, no back pain, no gait disturbance or myalgias. Neurological: No dizziness, no headaches, no numbness, no seizures, no syncope, no weakness, no tremors. Hematologic: No lymphadenopathy, no easy bruising. Psychiatric: No confusion, no hallucinations, no sleep disturbance.    Physical Exam: Filed Vitals:   07/20/12 0850  BP: 134/76  Pulse: 80   the general appearance reveals a well-developed well-nourished middle-aged gentleman in no distress.The head and neck exam reveals pupils equal and reactive.  Extraocular movements are full.  There is no scleral icterus.  The mouth and pharynx are normal.  The neck is  supple.  The carotids reveal no bruits.  The jugular venous pressure is normal.  The  thyroid is not enlarged.  There is no lymphadenopathy.  The chest is clear to percussion and auscultation.  There are no rales or rhonchi.  Expansion of the chest is symmetrical.  The precordium is quiet.  The first heart sound is normal.  The second heart sound is physiologically split.  There is no  gallop rub or click.  There is a soft systolic ejection murmur at the base.  There is no abnormal lift or heave.  The abdomen is soft and nontender.  The bowel sounds are normal.  The liver and spleen are not enlarged.  There are no abdominal masses.  There are no abdominal bruits.  Extremities reveal good pedal pulses.  There is no phlebitis or edema.  There is no cyanosis or clubbing.  Strength is normal and symmetrical in all extremities.  There is no lateralizing weakness.  There are no sensory deficits.  The skin is warm and dry.  There is no rash.     Assessment / Plan: Continue same medication.  Continue omeprazole once a day.  Continue baby aspirin once a day.  He has not been experiencing any chest pain or angina.  He'll be rechecked in 4 months for followup office visit CBC  hemoglobin A1c lipid panel hepatic function panel and basal metabolic panel

## 2012-07-20 NOTE — Patient Instructions (Addendum)
I have called Dr Antony Contras office [phone]) and left message with Sue Lush to call you back to set up an appointment. If you do not hear from her within the next few days contact our office  Your physician recommends that you continue on your current medications as directed. Please refer to the Current Medication list given to you today.  Your physician wants you to follow-up in: 4 months with fasting labs (lp/bmet/hfp/a1c/cbc)  You will receive a reminder letter in the mail two months in advance. If you don't receive a letter, please call our office to schedule the follow-up appointment.       Marland Kitchen

## 2012-07-20 NOTE — Assessment & Plan Note (Signed)
Hemoglobin A1c is improved even though his weight is up 1 pound.  Continue same medication and try harder with diet to lose weight.

## 2012-07-20 NOTE — Assessment & Plan Note (Signed)
His hemoglobin has built back up at a satisfactory rate.  Most recent hemoglobin is 12.6, up from 10.0.  He will continue over-the-counter iron.

## 2012-07-24 ENCOUNTER — Other Ambulatory Visit: Payer: Self-pay | Admitting: *Deleted

## 2012-07-24 MED ORDER — OMEPRAZOLE 40 MG PO CPDR: 40.0000 mg | DELAYED_RELEASE_CAPSULE | Freq: Every day | ORAL | Status: DC

## 2012-08-08 DIAGNOSIS — M48062 Spinal stenosis, lumbar region with neurogenic claudication: Secondary | ICD-10-CM | POA: Diagnosis not present

## 2012-08-08 DIAGNOSIS — M4310 Spondylolisthesis, site unspecified: Secondary | ICD-10-CM | POA: Diagnosis not present

## 2012-08-08 DIAGNOSIS — M545 Low back pain, unspecified: Secondary | ICD-10-CM | POA: Diagnosis not present

## 2012-08-08 DIAGNOSIS — M546 Pain in thoracic spine: Secondary | ICD-10-CM | POA: Diagnosis not present

## 2012-08-09 ENCOUNTER — Ambulatory Visit
Admission: RE | Admit: 2012-08-09 | Discharge: 2012-08-09 | Disposition: A | Discharge status: Home / Self Care | Payer: Medicare Other | Source: Ambulatory Visit | Attending: Neurosurgery | Admitting: Neurosurgery

## 2012-08-09 ENCOUNTER — Other Ambulatory Visit: Payer: Self-pay | Admitting: Neurosurgery

## 2012-08-09 DIAGNOSIS — M48062 Spinal stenosis, lumbar region with neurogenic claudication: Secondary | ICD-10-CM

## 2012-08-09 DIAGNOSIS — M5126 Other intervertebral disc displacement, lumbar region: Secondary | ICD-10-CM | POA: Diagnosis not present

## 2012-08-09 DIAGNOSIS — M48061 Spinal stenosis, lumbar region without neurogenic claudication: Secondary | ICD-10-CM | POA: Diagnosis not present

## 2012-08-14 ENCOUNTER — Other Ambulatory Visit: Payer: Medicare Other

## 2012-08-16 ENCOUNTER — Other Ambulatory Visit: Payer: Self-pay | Admitting: Cardiology

## 2012-08-25 ENCOUNTER — Other Ambulatory Visit: Payer: Self-pay | Admitting: Neurosurgery

## 2012-08-25 DIAGNOSIS — E669 Obesity, unspecified: Secondary | ICD-10-CM | POA: Diagnosis not present

## 2012-08-25 DIAGNOSIS — M4310 Spondylolisthesis, site unspecified: Secondary | ICD-10-CM | POA: Diagnosis not present

## 2012-08-25 DIAGNOSIS — M5137 Other intervertebral disc degeneration, lumbosacral region: Secondary | ICD-10-CM | POA: Diagnosis not present

## 2012-08-25 DIAGNOSIS — M48061 Spinal stenosis, lumbar region without neurogenic claudication: Secondary | ICD-10-CM | POA: Diagnosis not present

## 2012-09-05 ENCOUNTER — Encounter (HOSPITAL_COMMUNITY): Payer: Self-pay | Admitting: Pharmacy Technician

## 2012-09-11 NOTE — Pre-Procedure Instructions (Signed)
Willi Borowiak Surowiec  09/11/2012   Your procedure is scheduled on:  09/20/2012  Report to [facility] at 6:30 AM.  Call this number if you have problems the morning of surgery: [phone]   Remember:   Do not eat food or drink liquids after midnight.   On Tuesday   Take these medicines the morning of surgery with A SIP OF WATER: Metoprolol, Prilosec   Do not wear jewelry  Do not wear lotions, powders, or perfumes. You may wear deodorant.   Men may shave face and neck.  Do not bring valuables to the hospital.  [facility] is not responsible                   for any belongings or valuables.  Contacts, dentures or bridgework may not be worn into surgery.  Leave suitcase in the car. After surgery it may be brought to your room.  For patients admitted to the hospital, checkout time is 11:00 AM the day of  discharge.   Patients discharged the day of surgery will not be allowed to drive  home.  Name and phone number of your driver: /w   Special Instructions: Shower using CHG 2 nights before surgery and the night before surgery.  If you shower the day of surgery use CHG.  Use special wash - you have one bottle of CHG for all showers.  You should use approximately 1/3 of the bottle for each shower.   Please read over the following fact sheets that you were given: Pain Booklet, Coughing and Deep Breathing, Blood Transfusion Information, MRSA Information and Surgical Site Infection Prevention

## 2012-09-12 ENCOUNTER — Encounter (HOSPITAL_COMMUNITY): Payer: Self-pay

## 2012-09-12 ENCOUNTER — Encounter (HOSPITAL_COMMUNITY)
Admission: RE | Admit: 2012-09-12 | Discharge: 2012-09-12 | Disposition: A | Discharge status: Home / Self Care | Payer: Medicare Other | Source: Ambulatory Visit | Attending: Neurosurgery | Admitting: Neurosurgery

## 2012-09-12 DIAGNOSIS — Z01818 Encounter for other preprocedural examination: Secondary | ICD-10-CM | POA: Diagnosis not present

## 2012-09-12 DIAGNOSIS — Z01812 Encounter for preprocedural laboratory examination: Secondary | ICD-10-CM | POA: Diagnosis not present

## 2012-09-12 HISTORY — DX: Chronic or unspecified duodenal ulcer with hemorrhage: K26.4

## 2012-09-12 HISTORY — DX: Anemia, unspecified: D64.9

## 2012-09-12 HISTORY — DX: Drug induced constipation: K59.03

## 2012-09-12 HISTORY — DX: Unspecified osteoarthritis, unspecified site: M19.90

## 2012-09-12 LAB — TYPE AND SCREEN
ABO/RH(D): O POS
Antibody Screen: NEGATIVE

## 2012-09-12 LAB — CBC
Hemoglobin: 13.5 g/dL (ref 13.0–17.0)
MCH: 28.4 pg (ref 26.0–34.0)
MCHC: 32.3 g/dL (ref 30.0–36.0)
MCV: 88 fL (ref 78.0–100.0)
RBC: 4.75 MIL/uL (ref 4.22–5.81)

## 2012-09-12 LAB — SURGICAL PCR SCREEN
MRSA, PCR: NEGATIVE
Staphylococcus aureus: POSITIVE — AB

## 2012-09-12 LAB — BASIC METABOLIC PANEL
BUN: 19 mg/dL (ref 6–23)
CO2: 25 mEq/L (ref 19–32)
Glucose, Bld: 197 mg/dL — ABNORMAL HIGH (ref 70–99)
Potassium: 4.2 mEq/L (ref 3.5–5.1)
Sodium: 137 mEq/L (ref 135–145)

## 2012-09-12 LAB — ABO/RH: ABO/RH(D): O POS

## 2012-09-12 NOTE — Pre-Procedure Instructions (Signed)
Daryl Webb  09/12/2012   Your procedure is scheduled on:  09-20-2012  Wednesday   Report to [facility] at 6:30 AM.  Call this number if you have problems the morning of surgery: [phone]   Remember:   Do not eat food or drink liquids after midnight.    Take these medicines the morning of surgery with A SIP OF WATER: metoprolol,omeprazole,               STOP ALL ASPIRIN,AND ASPIRIN PRODUCTS,BLOOD THINNERS AND HERBAL SUPPLEMENTS   Do not wear jewelry.  Do not wear lotions, powders, or perfumes.   Do not shave 48 hours prior to surgery. Men may shave face and neck.  Do not bring valuables to the hospital.  [facility] is not responsible for any belongings or valuables.  Contacts, dentures or bridgework may not be worn into surgery.  Leave suitcase in the car. After surgery it may be brought to your room.   For patients admitted to the hospital, checkout time is 11:00 AM the day of discharge.   Patients discharged the day of surgery will not be allowed to drive home.    Special Instructions: Shower using CHG 2 nights before surgery and the night before surgery.  If you shower the day of surgery use CHG.  Use special wash - you have one bottle of CHG for all showers.  You should use approximately 1/3 of the bottle for each shower.   Please read over the following fact sheets that you were given: Pain Booklet, Blood Transfusion Information and Surgical Site Infection Prevention

## 2012-09-19 MED ORDER — CEFAZOLIN SODIUM-DEXTROSE 2-3 GM-% IV SOLR
2.0000 g | INTRAVENOUS | Status: AC
  Administered 2012-09-20: 1 g via INTRAVENOUS
  Administered 2012-09-20 (×2): 2 g via INTRAVENOUS
  Filled 2012-09-19: qty 50

## 2012-09-20 ENCOUNTER — Encounter (HOSPITAL_COMMUNITY): Payer: Self-pay | Admitting: *Deleted

## 2012-09-20 ENCOUNTER — Inpatient Hospital Stay (HOSPITAL_COMMUNITY): Payer: Medicare Other

## 2012-09-20 ENCOUNTER — Encounter (HOSPITAL_COMMUNITY)
Admission: RE | Disposition: A | Discharge status: Home / Self Care | Payer: Self-pay | Source: Ambulatory Visit | Attending: Neurosurgery

## 2012-09-20 ENCOUNTER — Inpatient Hospital Stay (HOSPITAL_COMMUNITY): Payer: Medicare Other | Admitting: Anesthesiology

## 2012-09-20 ENCOUNTER — Encounter (HOSPITAL_COMMUNITY): Payer: Self-pay | Admitting: Anesthesiology

## 2012-09-20 ENCOUNTER — Inpatient Hospital Stay (HOSPITAL_COMMUNITY)
Admission: RE | Admit: 2012-09-20 | Discharge: 2012-09-23 | DRG: 460 | Disposition: A | Discharge status: Home / Self Care | Payer: Medicare Other | Source: Ambulatory Visit | Attending: Neurosurgery | Admitting: Neurosurgery

## 2012-09-20 DIAGNOSIS — Z7982 Long term (current) use of aspirin: Secondary | ICD-10-CM | POA: Diagnosis not present

## 2012-09-20 DIAGNOSIS — M51379 Other intervertebral disc degeneration, lumbosacral region without mention of lumbar back pain or lower extremity pain: Secondary | ICD-10-CM | POA: Diagnosis present

## 2012-09-20 DIAGNOSIS — Z833 Family history of diabetes mellitus: Secondary | ICD-10-CM

## 2012-09-20 DIAGNOSIS — M5137 Other intervertebral disc degeneration, lumbosacral region: Secondary | ICD-10-CM | POA: Diagnosis not present

## 2012-09-20 DIAGNOSIS — Z8249 Family history of ischemic heart disease and other diseases of the circulatory system: Secondary | ICD-10-CM

## 2012-09-20 DIAGNOSIS — Z96659 Presence of unspecified artificial knee joint: Secondary | ICD-10-CM | POA: Diagnosis not present

## 2012-09-20 DIAGNOSIS — IMO0001 Reserved for inherently not codable concepts without codable children: Secondary | ICD-10-CM | POA: Diagnosis not present

## 2012-09-20 DIAGNOSIS — Z9852 Vasectomy status: Secondary | ICD-10-CM

## 2012-09-20 DIAGNOSIS — Z9079 Acquired absence of other genital organ(s): Secondary | ICD-10-CM

## 2012-09-20 DIAGNOSIS — M48062 Spinal stenosis, lumbar region with neurogenic claudication: Secondary | ICD-10-CM | POA: Diagnosis not present

## 2012-09-20 DIAGNOSIS — M5126 Other intervertebral disc displacement, lumbar region: Secondary | ICD-10-CM | POA: Diagnosis present

## 2012-09-20 DIAGNOSIS — Z8546 Personal history of malignant neoplasm of prostate: Secondary | ICD-10-CM | POA: Diagnosis not present

## 2012-09-20 DIAGNOSIS — M48061 Spinal stenosis, lumbar region without neurogenic claudication: Secondary | ICD-10-CM | POA: Diagnosis not present

## 2012-09-20 DIAGNOSIS — M539 Dorsopathy, unspecified: Secondary | ICD-10-CM | POA: Diagnosis not present

## 2012-09-20 DIAGNOSIS — M47817 Spondylosis without myelopathy or radiculopathy, lumbosacral region: Secondary | ICD-10-CM | POA: Diagnosis not present

## 2012-09-20 DIAGNOSIS — N35919 Unspecified urethral stricture, male, unspecified site: Secondary | ICD-10-CM | POA: Diagnosis present

## 2012-09-20 DIAGNOSIS — Z79899 Other long term (current) drug therapy: Secondary | ICD-10-CM

## 2012-09-20 DIAGNOSIS — E7800 Pure hypercholesterolemia, unspecified: Secondary | ICD-10-CM | POA: Diagnosis present

## 2012-09-20 DIAGNOSIS — I119 Hypertensive heart disease without heart failure: Secondary | ICD-10-CM | POA: Diagnosis present

## 2012-09-20 DIAGNOSIS — M4310 Spondylolisthesis, site unspecified: Secondary | ICD-10-CM | POA: Diagnosis not present

## 2012-09-20 DIAGNOSIS — Z87891 Personal history of nicotine dependence: Secondary | ICD-10-CM | POA: Diagnosis not present

## 2012-09-20 DIAGNOSIS — M519 Unspecified thoracic, thoracolumbar and lumbosacral intervertebral disc disorder: Secondary | ICD-10-CM | POA: Diagnosis not present

## 2012-09-20 HISTORY — PX: CYSTOSCOPY: SHX5120

## 2012-09-20 LAB — GLUCOSE, CAPILLARY
Glucose-Capillary: 174 mg/dL — ABNORMAL HIGH (ref 70–99)
Glucose-Capillary: 171 mg/dL — ABNORMAL HIGH (ref 70–99)
Glucose-Capillary: 216 mg/dL — ABNORMAL HIGH (ref 70–99)
Glucose-Capillary: 217 mg/dL — ABNORMAL HIGH (ref 70–99)

## 2012-09-20 SURGERY — POSTERIOR LUMBAR FUSION 2 LEVEL
Anesthesia: General | Site: Penis | Wound class: Clean

## 2012-09-20 MED ORDER — LOSARTAN POTASSIUM 50 MG PO TABS
50.0000 mg | ORAL_TABLET | Freq: Every day | ORAL | Status: DC
  Administered 2012-09-21 – 2012-09-22 (×2): 50 mg via ORAL
  Filled 2012-09-20 (×4): qty 1

## 2012-09-20 MED ORDER — CYCLOBENZAPRINE HCL 10 MG PO TABS
10.0000 mg | ORAL_TABLET | Freq: Three times a day (TID) | ORAL | Status: DC | PRN
  Administered 2012-09-21 – 2012-09-23 (×5): 10 mg via ORAL
  Filled 2012-09-20 (×5): qty 1

## 2012-09-20 MED ORDER — THROMBIN 5000 UNITS EX SOLR
OROMUCOSAL | Status: DC | PRN
  Administered 2012-09-20: 12:00:00 via TOPICAL

## 2012-09-20 MED ORDER — EPHEDRINE SULFATE 50 MG/ML IJ SOLN: INTRAMUSCULAR | Status: DC | PRN

## 2012-09-20 MED ORDER — AMLODIPINE-ATORVASTATIN 10-40 MG PO TABS: 1.0000 | ORAL_TABLET | Freq: Every evening | ORAL | Status: DC

## 2012-09-20 MED ORDER — HYDROMORPHONE HCL PF 1 MG/ML IJ SOLN
INTRAMUSCULAR | Status: AC
  Administered 2012-09-20: 0.5 mg via INTRAVENOUS
  Filled 2012-09-20: qty 1

## 2012-09-20 MED ORDER — LACTATED RINGERS IV SOLN
INTRAVENOUS | Status: DC | PRN
  Administered 2012-09-20 (×4): via INTRAVENOUS

## 2012-09-20 MED ORDER — FENTANYL CITRATE 0.05 MG/ML IJ SOLN
INTRAMUSCULAR | Status: DC | PRN
  Administered 2012-09-20 (×3): 50 ug via INTRAVENOUS
  Administered 2012-09-20: 100 ug via INTRAVENOUS
  Administered 2012-09-20: 50 ug via INTRAVENOUS
  Administered 2012-09-20: 100 ug via INTRAVENOUS
  Administered 2012-09-20 (×4): 50 ug via INTRAVENOUS

## 2012-09-20 MED ORDER — SODIUM CHLORIDE 0.9 % IR SOLN
Status: DC | PRN
  Administered 2012-09-20: 09:00:00

## 2012-09-20 MED ORDER — THROMBIN 20000 UNITS EX SOLR
CUTANEOUS | Status: DC | PRN
  Administered 2012-09-20 (×2): via TOPICAL

## 2012-09-20 MED ORDER — SODIUM CHLORIDE 0.9 % IV SOLN: 250.0000 mL | INTRAVENOUS | Status: DC

## 2012-09-20 MED ORDER — PHENYLEPHRINE HCL 10 MG/ML IJ SOLN
INTRAMUSCULAR | Status: DC | PRN
  Administered 2012-09-20 (×4): 40 ug via INTRAVENOUS
  Administered 2012-09-20: 80 ug via INTRAVENOUS
  Administered 2012-09-20: 40 ug via INTRAVENOUS

## 2012-09-20 MED ORDER — OXYCODONE HCL 5 MG PO TABS: 5.0000 mg | ORAL_TABLET | Freq: Once | ORAL | Status: DC | PRN

## 2012-09-20 MED ORDER — MIDAZOLAM HCL 5 MG/5ML IJ SOLN
INTRAMUSCULAR | Status: DC | PRN
  Administered 2012-09-20: 2 mg via INTRAVENOUS

## 2012-09-20 MED ORDER — ONDANSETRON HCL 4 MG/2ML IJ SOLN
INTRAMUSCULAR | Status: DC | PRN
  Administered 2012-09-20: 4 mg via INTRAVENOUS

## 2012-09-20 MED ORDER — ARTIFICIAL TEARS OP OINT
TOPICAL_OINTMENT | OPHTHALMIC | Status: DC | PRN
  Administered 2012-09-20: 1 via OPHTHALMIC

## 2012-09-20 MED ORDER — METOPROLOL SUCCINATE ER 50 MG PO TB24
50.0000 mg | ORAL_TABLET | Freq: Every day | ORAL | Status: DC
  Administered 2012-09-21 – 2012-09-22 (×2): 50 mg via ORAL
  Filled 2012-09-20 (×3): qty 1

## 2012-09-20 MED ORDER — ROCURONIUM BROMIDE 100 MG/10ML IV SOLN
INTRAVENOUS | Status: DC | PRN
  Administered 2012-09-20 (×2): 20 mg via INTRAVENOUS
  Administered 2012-09-20: 50 mg via INTRAVENOUS
  Administered 2012-09-20: 30 mg via INTRAVENOUS

## 2012-09-20 MED ORDER — BUPIVACAINE HCL (PF) 0.5 % IJ SOLN
INTRAMUSCULAR | Status: DC | PRN
  Administered 2012-09-20: 10 mL

## 2012-09-20 MED ORDER — AMLODIPINE BESYLATE 10 MG PO TABS
10.0000 mg | ORAL_TABLET | Freq: Every day | ORAL | Status: DC
  Administered 2012-09-21 – 2012-09-22 (×2): 10 mg via ORAL
  Filled 2012-09-20 (×4): qty 1

## 2012-09-20 MED ORDER — ALBUMIN HUMAN 5 % IV SOLN
INTRAVENOUS | Status: DC | PRN
  Administered 2012-09-20 (×2): via INTRAVENOUS

## 2012-09-20 MED ORDER — CEFAZOLIN SODIUM-DEXTROSE 2-3 GM-% IV SOLR
INTRAVENOUS | Status: AC
  Filled 2012-09-20: qty 50

## 2012-09-20 MED ORDER — MENTHOL 3 MG MT LOZG: 1.0000 | LOZENGE | OROMUCOSAL | Status: DC | PRN

## 2012-09-20 MED ORDER — PANTOPRAZOLE SODIUM 40 MG PO TBEC
80.0000 mg | DELAYED_RELEASE_TABLET | Freq: Every day | ORAL | Status: DC
  Administered 2012-09-21 – 2012-09-23 (×3): 80 mg via ORAL
  Filled 2012-09-20 (×3): qty 2

## 2012-09-20 MED ORDER — LOSARTAN POTASSIUM-HCTZ 50-12.5 MG PO TABS: 1.0000 | ORAL_TABLET | Freq: Every day | ORAL | Status: DC

## 2012-09-20 MED ORDER — ATORVASTATIN CALCIUM 40 MG PO TABS
40.0000 mg | ORAL_TABLET | Freq: Every day | ORAL | Status: DC
  Administered 2012-09-20 – 2012-09-22 (×3): 40 mg via ORAL
  Filled 2012-09-20 (×4): qty 1

## 2012-09-20 MED ORDER — SODIUM CHLORIDE 0.9 % IJ SOLN: 3.0000 mL | INTRAMUSCULAR | Status: DC | PRN

## 2012-09-20 MED ORDER — PHENOL 1.4 % MT LIQD: 1.0000 | OROMUCOSAL | Status: DC | PRN

## 2012-09-20 MED ORDER — ACETAMINOPHEN 10 MG/ML IV SOLN
INTRAVENOUS | Status: AC
  Administered 2012-09-20: 1000 mg via INTRAVENOUS
  Filled 2012-09-20: qty 100

## 2012-09-20 MED ORDER — HYDROCODONE-ACETAMINOPHEN 5-325 MG PO TABS: 1.0000 | ORAL_TABLET | ORAL | Status: DC | PRN

## 2012-09-20 MED ORDER — SODIUM CHLORIDE 0.9 % IV SOLN
INTRAVENOUS | Status: AC
  Filled 2012-09-20: qty 500

## 2012-09-20 MED ORDER — METFORMIN HCL 500 MG PO TABS
1000.0000 mg | ORAL_TABLET | Freq: Two times a day (BID) | ORAL | Status: DC
  Administered 2012-09-21 – 2012-09-23 (×5): 1000 mg via ORAL
  Filled 2012-09-20 (×7): qty 2

## 2012-09-20 MED ORDER — HYDROXYZINE HCL 50 MG PO TABS
50.0000 mg | ORAL_TABLET | ORAL | Status: DC | PRN
  Filled 2012-09-20: qty 1

## 2012-09-20 MED ORDER — LIDOCAINE-EPINEPHRINE 1 %-1:100000 IJ SOLN
INTRAMUSCULAR | Status: DC | PRN
  Administered 2012-09-20: 10 mL

## 2012-09-20 MED ORDER — PHENYLEPHRINE HCL 10 MG/ML IJ SOLN
10.0000 mg | INTRAVENOUS | Status: DC | PRN
  Administered 2012-09-20: 25 ug/min via INTRAVENOUS

## 2012-09-20 MED ORDER — OXYCODONE HCL 5 MG/5ML PO SOLN: 5.0000 mg | Freq: Once | ORAL | Status: DC | PRN

## 2012-09-20 MED ORDER — NEOSTIGMINE METHYLSULFATE 1 MG/ML IJ SOLN
INTRAMUSCULAR | Status: DC | PRN
  Administered 2012-09-20: 5 mg via INTRAVENOUS

## 2012-09-20 MED ORDER — LIDOCAINE HCL (CARDIAC) 20 MG/ML IV SOLN
INTRAVENOUS | Status: DC | PRN
  Administered 2012-09-20: 100 mg via INTRAVENOUS

## 2012-09-20 MED ORDER — EPHEDRINE SULFATE 50 MG/ML IJ SOLN
INTRAMUSCULAR | Status: DC | PRN
  Administered 2012-09-20: 5 mg via INTRAVENOUS
  Administered 2012-09-20 (×2): 10 mg via INTRAVENOUS
  Administered 2012-09-20 (×5): 5 mg via INTRAVENOUS

## 2012-09-20 MED ORDER — ACETAMINOPHEN 650 MG RE SUPP: 650.0000 mg | RECTAL | Status: DC | PRN

## 2012-09-20 MED ORDER — CEFAZOLIN SODIUM 1-5 GM-% IV SOLN
INTRAVENOUS | Status: AC
  Filled 2012-09-20: qty 50

## 2012-09-20 MED ORDER — PROPOFOL 10 MG/ML IV BOLUS
INTRAVENOUS | Status: DC | PRN
  Administered 2012-09-20: 50 mg via INTRAVENOUS
  Administered 2012-09-20: 150 mg via INTRAVENOUS

## 2012-09-20 MED ORDER — DOCUSATE SODIUM 100 MG PO CAPS
100.0000 mg | ORAL_CAPSULE | Freq: Two times a day (BID) | ORAL | Status: DC
  Administered 2012-09-20 – 2012-09-22 (×5): 100 mg via ORAL
  Filled 2012-09-20 (×5): qty 1

## 2012-09-20 MED ORDER — BACITRACIN 50000 UNITS IM SOLR
INTRAMUSCULAR | Status: AC
  Filled 2012-09-20: qty 1

## 2012-09-20 MED ORDER — METFORMIN HCL 500 MG PO TABS: 1000.0000 mg | ORAL_TABLET | Freq: Two times a day (BID) | ORAL | Status: DC

## 2012-09-20 MED ORDER — OXYCODONE-ACETAMINOPHEN 5-325 MG PO TABS
1.0000 | ORAL_TABLET | ORAL | Status: DC | PRN
  Administered 2012-09-21 – 2012-09-23 (×10): 2 via ORAL
  Filled 2012-09-20 (×10): qty 2

## 2012-09-20 MED ORDER — MAGNESIUM HYDROXIDE 400 MG/5ML PO SUSP
30.0000 mL | Freq: Every day | ORAL | Status: DC | PRN
  Administered 2012-09-22: 30 mL via ORAL
  Filled 2012-09-20: qty 30

## 2012-09-20 MED ORDER — GLYCOPYRROLATE 0.2 MG/ML IJ SOLN
INTRAMUSCULAR | Status: DC | PRN
  Administered 2012-09-20: 0.6 mg via INTRAVENOUS

## 2012-09-20 MED ORDER — ALUM & MAG HYDROXIDE-SIMETH 200-200-20 MG/5ML PO SUSP: 30.0000 mL | Freq: Four times a day (QID) | ORAL | Status: DC | PRN

## 2012-09-20 MED ORDER — PROMETHAZINE HCL 25 MG/ML IJ SOLN
6.2500 mg | INTRAMUSCULAR | Status: DC | PRN
  Administered 2012-09-20: 6.25 mg via INTRAVENOUS

## 2012-09-20 MED ORDER — HYDROMORPHONE HCL PF 1 MG/ML IJ SOLN: 0.2500 mg | INTRAMUSCULAR | Status: DC | PRN

## 2012-09-20 MED ORDER — ONDANSETRON HCL 4 MG/2ML IJ SOLN: 4.0000 mg | Freq: Four times a day (QID) | INTRAMUSCULAR | Status: DC | PRN

## 2012-09-20 MED ORDER — POTASSIUM CHLORIDE IN NACL 20-0.9 MEQ/L-% IV SOLN
INTRAVENOUS | Status: DC
  Filled 2012-09-20 (×8): qty 1000

## 2012-09-20 MED ORDER — ACETAMINOPHEN 325 MG PO TABS: 650.0000 mg | ORAL_TABLET | ORAL | Status: DC | PRN

## 2012-09-20 MED ORDER — PROMETHAZINE HCL 25 MG/ML IJ SOLN
INTRAMUSCULAR | Status: AC
  Filled 2012-09-20: qty 1

## 2012-09-20 MED ORDER — BISACODYL 10 MG RE SUPP: 10.0000 mg | Freq: Every day | RECTAL | Status: DC | PRN

## 2012-09-20 MED ORDER — ZOLPIDEM TARTRATE 5 MG PO TABS
5.0000 mg | ORAL_TABLET | Freq: Every evening | ORAL | Status: DC | PRN
  Administered 2012-09-20 – 2012-09-22 (×2): 5 mg via ORAL
  Filled 2012-09-20 (×2): qty 1

## 2012-09-20 MED ORDER — SODIUM CHLORIDE 0.9 % IJ SOLN
3.0000 mL | Freq: Two times a day (BID) | INTRAMUSCULAR | Status: DC
  Administered 2012-09-21 – 2012-09-22 (×4): 3 mL via INTRAVENOUS

## 2012-09-20 MED ORDER — MORPHINE SULFATE 4 MG/ML IJ SOLN
4.0000 mg | INTRAMUSCULAR | Status: DC | PRN
  Administered 2012-09-20: 4 mg via INTRAMUSCULAR
  Administered 2012-09-20 – 2012-09-21 (×2): 8 mg via INTRAMUSCULAR
  Administered 2012-09-21 – 2012-09-22 (×2): 4 mg via INTRAMUSCULAR
  Filled 2012-09-20 (×5): qty 1
  Filled 2012-09-20: qty 2

## 2012-09-20 MED ORDER — HYDROCHLOROTHIAZIDE 12.5 MG PO CAPS
12.5000 mg | ORAL_CAPSULE | Freq: Every day | ORAL | Status: DC
  Administered 2012-09-21 – 2012-09-22 (×2): 12.5 mg via ORAL
  Filled 2012-09-20 (×4): qty 1

## 2012-09-20 MED ORDER — 0.9 % SODIUM CHLORIDE (POUR BTL) OPTIME
TOPICAL | Status: DC | PRN
  Administered 2012-09-20 (×4): 1000 mL

## 2012-09-20 SURGICAL SUPPLY — 85 items
ADH SKN CLS APL DERMABOND .7 (GAUZE/BANDAGES/DRESSINGS) ×4
ADH SKN CLS LQ APL DERMABOND (GAUZE/BANDAGES/DRESSINGS) ×4
APL SKNCLS STERI-STRIP NONHPOA (GAUZE/BANDAGES/DRESSINGS)
BAG DECANTER FOR FLEXI CONT (MISCELLANEOUS) ×3 IMPLANT
BENZOIN TINCTURE PRP APPL 2/3 (GAUZE/BANDAGES/DRESSINGS) IMPLANT
BLADE SURG ROTATE 9660 (MISCELLANEOUS) IMPLANT
BRUSH SCRUB EZ PLAIN DRY (MISCELLANEOUS) ×3 IMPLANT
BUR ACRON 5.0MM COATED (BURR) ×4 IMPLANT
BUR MATCHSTICK NEURO 3.0 LAGG (BURR) ×3 IMPLANT
CANISTER SUCTION 2500CC (MISCELLANEOUS) ×3 IMPLANT
CAP LCK SPNE (Orthopedic Implant) ×12 IMPLANT
CAP LOCK SPINE RADIUS (Orthopedic Implant) IMPLANT
CAP LOCKING (Orthopedic Implant) ×18 IMPLANT
CATH COUDE FOLEY 2W 5CC 16FR (CATHETERS) ×1 IMPLANT
CLOTH BEACON ORANGE TIMEOUT ST (SAFETY) ×3 IMPLANT
CONT SPEC 4OZ CLIKSEAL STRL BL (MISCELLANEOUS) ×6 IMPLANT
COVER BACK TABLE 24X17X13 BIG (DRAPES) IMPLANT
COVER TABLE BACK 60X90 (DRAPES) ×3 IMPLANT
CROSSLINK MEDIUM (Orthopedic Implant) ×1 IMPLANT
DERMABOND ADHESIVE PROPEN (GAUZE/BANDAGES/DRESSINGS) ×2
DERMABOND ADVANCED (GAUZE/BANDAGES/DRESSINGS) ×2
DERMABOND ADVANCED .7 DNX12 (GAUZE/BANDAGES/DRESSINGS) ×4 IMPLANT
DERMABOND ADVANCED .7 DNX6 (GAUZE/BANDAGES/DRESSINGS) IMPLANT
DRAPE C-ARM 42X72 X-RAY (DRAPES) ×6 IMPLANT
DRAPE LAPAROTOMY 100X72X124 (DRAPES) ×3 IMPLANT
DRAPE MICROSCOPE LEICA (MISCELLANEOUS) ×1 IMPLANT
DRAPE POUCH INSTRU U-SHP 10X18 (DRAPES) ×3 IMPLANT
DRAPE PROXIMA HALF (DRAPES) IMPLANT
DRSG EMULSION OIL 3X3 NADH (GAUZE/BANDAGES/DRESSINGS) IMPLANT
ELECT REM PT RETURN 9FT ADLT (ELECTROSURGICAL) ×3
ELECTRODE REM PT RTRN 9FT ADLT (ELECTROSURGICAL) ×2 IMPLANT
GAUZE SPONGE 4X4 16PLY XRAY LF (GAUZE/BANDAGES/DRESSINGS) ×3 IMPLANT
GLOVE BIOGEL M 8.0 STRL (GLOVE) ×1 IMPLANT
GLOVE BIOGEL PI IND STRL 8 (GLOVE) ×4 IMPLANT
GLOVE BIOGEL PI INDICATOR 8 (GLOVE) ×4
GLOVE ECLIPSE 7.5 STRL STRAW (GLOVE) ×10 IMPLANT
GLOVE EXAM NITRILE LRG STRL (GLOVE) ×2 IMPLANT
GLOVE EXAM NITRILE MD LF STRL (GLOVE) IMPLANT
GLOVE EXAM NITRILE XL STR (GLOVE) IMPLANT
GLOVE EXAM NITRILE XS STR PU (GLOVE) IMPLANT
GOWN BRE IMP SLV AUR LG STRL (GOWN DISPOSABLE) IMPLANT
GOWN BRE IMP SLV AUR XL STRL (GOWN DISPOSABLE) ×7 IMPLANT
GOWN STRL REIN 2XL LVL4 (GOWN DISPOSABLE) ×2 IMPLANT
KIT BASIN OR (CUSTOM PROCEDURE TRAY) ×3 IMPLANT
KIT ROOM TURNOVER OR (KITS) ×3 IMPLANT
MILL MEDIUM DISP (BLADE) ×4 IMPLANT
NDL 18GX1X1/2 (RX/OR ONLY) (NEEDLE) ×2 IMPLANT
NDL SPNL 18GX3.5 QUINCKE PK (NEEDLE) ×2 IMPLANT
NDL SPNL 22GX3.5 QUINCKE BK (NEEDLE) ×2 IMPLANT
NEEDLE 18GX1X1/2 (RX/OR ONLY) (NEEDLE) ×3 IMPLANT
NEEDLE BONE MARROW 8GAX6 (NEEDLE) ×1 IMPLANT
NEEDLE SPNL 18GX3.5 QUINCKE PK (NEEDLE) ×3 IMPLANT
NEEDLE SPNL 22GX3.5 QUINCKE BK (NEEDLE) ×3 IMPLANT
NS IRRIG 1000ML POUR BTL (IV SOLUTION) ×6 IMPLANT
PACK LAMINECTOMY NEURO (CUSTOM PROCEDURE TRAY) ×3 IMPLANT
PAD ARMBOARD 7.5X6 YLW CONV (MISCELLANEOUS) ×9 IMPLANT
PATTIES SURGICAL .5 X.5 (GAUZE/BANDAGES/DRESSINGS) ×2 IMPLANT
PATTIES SURGICAL .5 X1 (DISPOSABLE) IMPLANT
PATTIES SURGICAL 1X1 (DISPOSABLE) ×3 IMPLANT
PEEK PLIF AVS 10X25X4 (Peek) ×4 IMPLANT
ROD 70MM (Rod) ×6 IMPLANT
ROD SPNL 70X5.5XNS TI RDS (Rod) IMPLANT
RUBBERBAND STERILE (MISCELLANEOUS) ×2 IMPLANT
SCREW 5.75 X 635 (Screw) ×2 IMPLANT
SCREW 5.75X45MM (Screw) ×4 IMPLANT
SPONGE GAUZE 4X4 12PLY (GAUZE/BANDAGES/DRESSINGS) ×3 IMPLANT
SPONGE LAP 4X18 X RAY DECT (DISPOSABLE) IMPLANT
SPONGE NEURO XRAY DETECT 1X3 (DISPOSABLE) ×2 IMPLANT
SPONGE SURGIFOAM ABS GEL 100 (HEMOSTASIS) ×1 IMPLANT
STAPLER SKIN PROX WIDE 3.9 (STAPLE) IMPLANT
STRIP BIOACTIVE VITOSS 25X100X (Neuro Prosthesis/Implant) ×2 IMPLANT
STRIP CLOSURE SKIN 1/2X4 (GAUZE/BANDAGES/DRESSINGS) IMPLANT
SUT PROLENE 6 0 BV (SUTURE) ×1 IMPLANT
SUT VIC AB 1 CT1 18XBRD ANBCTR (SUTURE) ×4 IMPLANT
SUT VIC AB 1 CT1 8-18 (SUTURE) ×6
SUT VIC AB 2-0 CP2 18 (SUTURE) ×7 IMPLANT
SYR 20CC LL (SYRINGE) ×3 IMPLANT
SYR 5ML LL (SYRINGE) IMPLANT
SYR CONTROL 10ML LL (SYRINGE) ×3 IMPLANT
TOWEL OR 17X24 6PK STRL BLUE (TOWEL DISPOSABLE) ×3 IMPLANT
TOWEL OR 17X26 10 PK STRL BLUE (TOWEL DISPOSABLE) ×3 IMPLANT
TRAP SPECIMEN MUCOUS 40CC (MISCELLANEOUS) IMPLANT
TRAY FOLEY CATH 14FRSI W/METER (CATHETERS) ×3 IMPLANT
TRAY FOLEY CATH 16FRSI W/METER (SET/KITS/TRAYS/PACK) ×1 IMPLANT
WATER STERILE IRR 1000ML POUR (IV SOLUTION) ×4 IMPLANT

## 2012-09-20 NOTE — Progress Notes (Signed)
Filed Vitals:   09/20/12 1715 09/20/12 1724 09/20/12 1826 09/20/12 1934  BP: 128/59  115/63 109/66  Pulse: 63 65 76 76  Temp:  97 F (36.1 C) 97.7 F (36.5 C) 97.6 F (36.4 C)  TempSrc:    Oral  Resp: 14 16 16 18   SpO2: 98% 98% 93% 98%    Patient resting comfortably in bed. Dressing clean dry. Foley to straight drainage. Moving all extremity is well.  Plan: We'll begin to mobilize in a.m.  Hewitt Shorts, MD 09/20/2012, 8:58 PM

## 2012-09-20 NOTE — Op Note (Signed)
09/20/2012  3:54 PM  PATIENT:  Daryl Webb  67 y.o. male  PRE-OPERATIVE DIAGNOSIS:  Multilevel multifactorial Lumbar stenosis, dynamic degenerative Spondylolisthesis, Lumbar degenerative disc disease, Lumbar spondylosis, neurogenic claudication  POST-OPERATIVE DIAGNOSIS:  Multilevel multifactorial Lumbar stenosis, dynamic degenerative Spondylolisthesis, Lumbar degenerative disc disease, Lumbar spondylosis, neurogenic claudication  PROCEDURE:  Procedure(s): 1)  Lumbar Three to Five,Decompressive lumbar laminotomy with Lumbar Three to Four, Lumbar Four to Five, Posterior lumbar interbody fusion/Lumbar Three to Five posterolateral arthrodesis:  Bilateral L3-L5 laminectomy, with bilateral L3-4 and L4-5 facetectomy, and foraminotomies for the exiting L3, L4, and L5 nerve roots bilaterally for decompression of central canal, lateral recess, and neural foraminal stenosis, with decompression beyond that required for interbody arthrodesis with the operating microscope, and microdissection microsurgical technique; bilateral L3-4 and L4-5 posterior lumbar interbody arthrodesis with AVS peek interbody implants, locally harvested morcellized autograft, and Vitoss BA with bone marrow aspirate; bilateral L3-L5 posterolateral arthrodesis with segmental radius posterior instrumentation and Vitoss BA with bone marrow aspirate 2)  CYSTOSCOPY  SURGEON:  Surgeon(s): Hewitt Shorts, MD Crecencio Mc, MD Karn Cassis, MD  ASSISTANTS: Hilda Lias, M.D.  ANESTHESIA:   general  EBL:  Total I/O In: 4960 [I.V.:4000; Blood:460; IV Piggyback:500] Out: 1380 [Urine:280; Blood:1100]  BLOOD ADMINISTERED:460 CC CELLSAVER  COUNT:  Correct per nursing staff  DICTATION: Patient was brought to the operating room placed under general endotracheal anesthesia. The nursing staff and then subsequently I tried to place a Foley catheter. Were unsuccessful. And therefore Dr. Heloise Purpura from [facility] was  consulted. He came to the operating room, and performed a cystoscopy, and was able to place a Foley catheter after doing a urethral dilation. He dictated a separate dictation regarding that procedure. Once a Foley catheter was placed, the patient was turned to prone position, the lumbar region was prepped with Betadine soap and solution and draped in a sterile fashion. The midline was infiltrated with local anesthesia with epinephrine. A midline incision was made and carried down through the subcutaneous tissue, bipolar cautery and electrocautery were used to maintain hemostasis. Dissection was carried down to the lumbar fascia. The fascia was incised bilaterally and the paraspinal muscles were dissected with a spinous process and lamina in a subperiosteal fashion. An x-ray was taken for localization and the L3, L4, and L5 levels were localized. Dissection was then carried out laterally over the facet complexes and the transverse processes of L3, L4, and L5 were exposed and decorticated. Decompression was begun with laminectomy, using double-action rongeurs, the high-speed drill and Kerrison punches.  We did encounter a moderate adhesions in the epidural space, and as we disssected and decompressed the thecal sac, a small dural tear occurred. We draped the operating microscope, and was brought to the field provided additional magnification, illumination, and visualization.  The dural tear was then sutured closed with a running 6-0 Prolene suture.  A good water tight closure was achieved. Dissection was carried out laterally including facetectomy and foraminotomies with decompression of the stenotic compression of the L3, L4, and L5 nerve roots. Once the decompression of the stenotic compression of the thecal sac and exiting nerve roots was completed we proceeded with the posterior lumbar interbody arthrodesis. The annulus at each level was incised bilaterally and the disc space entered. A thorough discectomy was  performed using pituitary rongeurs and curettes. Once the discectomy was completed we began to prepare the endplate surfaces, removing the cartilaginous endplates surface. We then measured the height of the intervertebral disc space.  We selected 10 x 25 x 4 by 8 AVS peek interbody implants for the L3-4 level, and 10 x 25 x 4 by 8 AVS peek interbody implants for the L4-5 level.  The C-arm fluoroscope was then draped and brought in the field and we identified the pedicle entry points bilaterally at the L3, L4, and L5 levels. Each of the 6 pedicles was probed, we aspirated bone marrow aspirate from the vertebral bodies, this was injected over a 10 cc strip and a 5 cc strip of Vitoss BA. Then each of the pedicles was examined with the ball probe, good bony surfaces were found and no bony cuts were found. Each of the pedicles was then tapped with a 5.25 mm tap, again examined with the ball probe good threading was found and no bony cuts were found. We then placed 5.75 by 45 millimeter screws bilaterally at the L3 level, 5.75 by 45 millimeter screws bilaterally at the L4 level, and 5.75 by 35 millimeter screws bilaterally at the L5 level.  We then packed the AVS peek interbody implants with Vitoss BA with bone marrow aspirate, and then placed the first implant at the L4-5 level on the right side, carefully retracting the thecal sac and nerve root medially. We then went back to the left side and packed the midline with locally harvested morcellized autograft and then placed a second implant on the left side again retracting the thecal sac and nerve root medially. Vitoss BA with bone marrow aspirate was packed lateral to the implants.  Then at the L3-4 level, we placed the first implant on the right side, carefully retracting the thecal sac and nerve root medially. We then went back to the left side and packed the midline with locally harvested morcellized autograft and then placed a second implant on the left side,  again retracting the thecal sac and nerve root medially. Vitoss BA with bone marrow aspirate was packed lateral to the implants.   We then packed the lateral gutter over the transverse processes and intertransverse space with Vitoss BA with bone marrow aspirate. We then selected a 70 mm pre-lordosed rods, they were placed within the screw heads and secured with locking caps once all 6 locking caps were placed final tightening was performed against a counter torque. We then placed a variable medium cross connector between the L4 and L5 screws bilaterally. He was secured to the rods, and then the central tightening collar was tightened down.  The wound had been irrigated multiple times during the procedure with saline solution and bacitracin solution, good hemostasis was established with a combination of bipolar cautery and Gelfoam with thrombin. Once good hemostasis was confirmed we proceeded with closure paraspinal muscles deep fascia and Scarpa's fascia were closed with interrupted undyed 1 Vicryl sutures the subcutaneous and subcuticular closed with interrupted inverted 2-0 undyed Vicryl sutures the skin edges were approximated with Dermabond.  A dressing of sterile gauze and Hypafix was applied.  Following surgery the patient was turned back to the supine position to be reversed and the anesthetic extubated and transferred to the recovery room for further care.    PLAN OF CARE: Admit to inpatient   PATIENT DISPOSITION:  PACU - hemodynamically stable.   Delay start of Pharmacological VTE agent (>24hrs) due to surgical blood loss or risk of bleeding:  yes

## 2012-09-20 NOTE — H&P (Signed)
Subjective: Patient is a 67 y.o. male who is admitted for treatment of marked stenosis at the L3-4 and L4-5 levels, has progressively worsened over the years, has contributed to by degenerative spondylolisthesis at both the L3-4 and L4-5 levels in conjunction with hypertrophic facet arthropathy, ligamentum flavum hypertrophy, and degenerative disc protrusion. Patient symptomatically has had steadily worsening neurogenic claudication. It has been intermittent over the past 12 years, but is become increasingly severe and persistent. Patient is admitted now for an L3-L5 decompressive lumbar laminectomy, bilateral L3-4 and L4-5 posterior lumbar interbody arthrodesis with interbody implants and bone graft, and an L3-L5 posterolateral arthrodesis with postreduction patient and bone graft.   Patient Active Problem List   Diagnosis Date Noted  . Low back pain 07/20/2012  . Iron deficiency anemia due to chronic blood loss 07/20/2012  . Acute posthemorrhagic anemia 06/27/2012  . Melena 06/19/2012  . Dyspnea 06/19/2012  . Type II or unspecified type diabetes mellitus without mention of complication, uncontrolled 09/01/2011  . Heart murmur 04/14/2011  . Benign hypertensive heart disease without heart failure 08/17/2010  . Hypercholesterolemia   . History of prostate cancer   . History of urethral stricture   . H/O: osteoarthritis    Past Medical History  Diagnosis Date  . Hypertension   . Diabetes mellitus   . Hypercholesterolemia   . History of prostate cancer   . History of urethral stricture   . H/O: osteoarthritis   . Constipation due to pain medication     needs stool softner while on pain medication  . Arthritis   . Anemia   . Ulcer duodenal hemorrhage     Past Surgical History  Procedure Laterality Date  . Vasectomy  1985  . Carpal tunnel release      right hand,wrist Dr. Teressa Senter  . Arthroplasty  2004    right knee Dr. Lequita Halt  . Prostatectomy      radical  . Joint replacement  Right   . Knee arthroscopy Right     Prescriptions prior to admission  Medication Sig Dispense Refill  . amLODipine-atorvastatin (CADUET) 10-40 MG per tablet Take 1 tablet by mouth every evening.      Marland Kitchen aspirin EC 81 MG tablet Take 81 mg by mouth daily.      . Cholecalciferol 2000 UNITS CAPS Take 1 capsule by mouth daily.      . Coenzyme Q10 (COQ10) 100 MG CAPS Take 100 mg by mouth daily.      Marland Kitchen loratadine (CLARITIN) 10 MG tablet Take 5 mg by mouth daily. As needed      . losartan-hydrochlorothiazide (HYZAAR) 50-12.5 MG per tablet Take 1 tablet by mouth daily.      . metFORMIN (GLUCOPHAGE) 1000 MG tablet Take 1 tablet (1,000 mg total) by mouth 2 (two) times daily with a meal.  180 tablet  3  . metoprolol succinate (TOPROL-XL) 50 MG 24 hr tablet Take 1 tablet (50 mg total) by mouth daily. Take with or immediately following a meal.  90 tablet  3  . Omega-3 Fatty Acids (FISH OIL) 1000 MG CAPS Take 1,000 mg by mouth daily.      Marland Kitchen omeprazole (PRILOSEC) 40 MG capsule Take 1 capsule (40 mg total) by mouth daily.  90 capsule  1  . OVER THE COUNTER MEDICATION Take 1.2 mg by mouth daily. Energizing iron by enzymatic therapy (heme iron with vitamin b12) (2 softgels equal 1.2 mg)      . OVER THE COUNTER MEDICATION Take 1 lozenge  by mouth daily. 50 mg vitamin b6, 400 mcg vitamin b12, 400 mcg folic acid       No Known Allergies  History  Substance Use Topics  . Smoking status: Former Smoker -- 1.50 packs/day for 20 years    Types: Cigarettes    Quit date: 02/09/1980  . Smokeless tobacco: Never Used  . Alcohol Use: No    Family History  Problem Relation Age of Onset  . Heart disease Mother   . Heart failure Mother   . Heart disease Father   . Diabetes Father      Review of Systems A comprehensive review of systems was negative.  Objective: Vital signs in last 24 hours: Temp:  [98 F (36.7 C)] 98 F (36.7 C) (08/13 0726) Pulse Rate:  [70] 70 (08/13 0726) Resp:  [20] 20 (08/13 0726) BP:  (144)/(80) 144/80 mmHg (08/13 0726) SpO2:  [98 %] 98 % (08/13 0726)  EXAM: Patient is a well-developed well-nourished white male in no acute distress. Lungs are clear to auscultation , the patient has symmetrical respiratory excursion. Heart has a regular rate and rhythm normal S1 and S2 no murmur.   Abdomen is soft nontender nondistended bowel sounds are present. Extremity examination shows no clubbing cyanosis or edema. Musculoskeletal examination shows no tenderness to palpation over the lumbar spinous processes or paralumbar musculature, but he is limited in flexion and about 30 and in extension at about 5-10, limited by pain. Straight leg raising is negative bilaterally. Motor examination shows 5 over 5 strength in the lower extremities including the iliopsoas quadriceps dorsiflexor extensor hallicus  longus and plantar flexor bilaterally. Sensation is intact to pinprick in the distal lower extremities. Reflexes show the left quadriceps 2, right quadriceps 1, gastrocnemius is 2 bilaterally. Toes are downgoing bilaterally. His stance sure he mildly flexed forward posture.   Data Review:CBC    Component Value Date/Time   WBC 9.1 09/12/2012 0914   RBC 4.75 09/12/2012 0914   HGB 13.5 09/12/2012 0914   HCT 41.8 09/12/2012 0914   PLT 240 09/12/2012 0914   MCV 88.0 09/12/2012 0914   MCH 28.4 09/12/2012 0914   MCHC 32.3 09/12/2012 0914   RDW 15.3 09/12/2012 0914   LYMPHSABS 2.7 07/18/2012 0837   MONOABS 0.7 07/18/2012 0837   EOSABS 0.3 07/18/2012 0837   BASOSABS 0.0 07/18/2012 0837                          BMET    Component Value Date/Time   NA 137 09/12/2012 0914   K 4.2 09/12/2012 0914   CL 102 09/12/2012 0914   CO2 25 09/12/2012 0914   GLUCOSE 197* 09/12/2012 0914   BUN 19 09/12/2012 0914   CREATININE 1.08 09/12/2012 0914   CALCIUM 9.6 09/12/2012 0914   GFRNONAA 69* 09/12/2012 0914   GFRAA 80* 09/12/2012 0914     Assessment/Plan: Patient with marked spinal stenosis at the L3-4 and L4-5 levels with associated  degenerative spondylolisthesis, is now admitted for lumbar decompression and arthrodesis.  I've discussed with the patient the nature of his condition, the nature the surgical procedure, the typical length of surgery, hospital stay, and overall recuperation, the limitations postoperatively, and risks of surgery. I discussed risks including risks of infection, bleeding, possibly need for transfusion, the risk of nerve root dysfunction with pain, weakness, numbness, or paresthesias, the risk of dural tear and CSF leakage and possible need for further surgery, the risk of failure of  the arthrodesis and possibly for further surgery, the risk of anesthetic complications including myocardial infarction, stroke, pneumonia, and death. We discussed the need for postoperative immobilization in a lumbar brace. Understanding all this the patient does wish to proceed with surgery and is admitted for such.     Hewitt Shorts, MD 09/20/2012 8:04 AM

## 2012-09-20 NOTE — Anesthesia Procedure Notes (Signed)
Procedure Name: Intubation Date/Time: 09/20/2012 8:38 AM Performed by: Sherie Don Pre-anesthesia Checklist: Patient identified, Emergency Drugs available, Suction available, Patient being monitored and Timeout performed Patient Re-evaluated:Patient Re-evaluated prior to inductionOxygen Delivery Method: Circle system utilized Preoxygenation: Pre-oxygenation with 100% oxygen Intubation Type: IV induction Ventilation: Mask ventilation without difficulty Laryngoscope Size: Mac and 3 Grade View: Grade I Tube size: 7.5 mm Number of attempts: 1 Airway Equipment and Method: Stylet Placement Confirmation: ETT inserted through vocal cords under direct vision,  positive ETCO2 and breath sounds checked- equal and bilateral Secured at: 23 cm Tube secured with: Tape Dental Injury: Teeth and Oropharynx as per pre-operative assessment

## 2012-09-20 NOTE — Transfer of Care (Signed)
Immediate Anesthesia Transfer of Care Note  Patient: Daryl Webb  Procedure(s) Performed: Procedure(s) with comments: Lumbar Three to Five,Decompressive lumbar laminotomy with Lumbar Three to Four, Lumbar Four to Five, Posterior lumbar interbody fusion/Lumbar Three to Five posterolateral arthrodesis (N/A) - POSTERIOR LUMBAR FUSION 2 LEVEL CYSTOSCOPY (N/A)  Patient Location: PACU  Anesthesia Type:General  Level of Consciousness: sedated, patient cooperative and responds to stimulation  Airway & Oxygen Therapy: Patient Spontanous Breathing and Patient connected to face mask oxygen  Post-op Assessment: Report given to PACU RN, Post -op Vital signs reviewed and stable, Patient moving all extremities and Patient moving all extremities X 4  Post vital signs: Reviewed and stable  Complications: No apparent anesthesia complications

## 2012-09-20 NOTE — Anesthesia Preprocedure Evaluation (Addendum)
Anesthesia Evaluation  Patient identified by MRN, date of birth, ID band Patient awake    Reviewed: Allergy & Precautions, H&P , NPO status , Patient's Chart, lab work & pertinent test results, reviewed documented beta blocker date and time   History of Anesthesia Complications Negative for: history of anesthetic complications  Airway Mallampati: II TM Distance: >3 FB Neck ROM: Full    Dental  (+) Teeth Intact and Dental Advisory Given   Pulmonary shortness of breath and with exertion,    Pulmonary exam normal       Cardiovascular hypertension, Pt. on home beta blockers  2013 Echo: Left ventricle: Severe assymmetric LVH. The cavity size was normal. Systolic function was normal. The estimated ejection fraction was in the range of 60% to 65%. Wall motion was normal; there were no regional wall motion abnormalities.    Neuro/Psych negative neurological ROS  negative psych ROS   GI/Hepatic Neg liver ROS, PUD,   Endo/Other  diabetes, Oral Hypoglycemic Agents  Renal/GU negative Renal ROS     Musculoskeletal   Abdominal   Peds  Hematology negative hematology ROS (+)   Anesthesia Other Findings   Reproductive/Obstetrics                          Anesthesia Physical Anesthesia Plan  ASA: III  Anesthesia Plan: General   Post-op Pain Management:    Induction: Intravenous  Airway Management Planned: Oral ETT  Additional Equipment:   Intra-op Plan:   Post-operative Plan: Extubation in OR  Informed Consent:   Plan Discussed with: CRNA, Anesthesiologist and Surgeon  Anesthesia Plan Comments:         Anesthesia Quick Evaluation

## 2012-09-20 NOTE — Anesthesia Postprocedure Evaluation (Signed)
Anesthesia Post Note  Patient: Daryl Webb  Procedure(s) Performed: Procedure(s) (LRB): Lumbar Three to Five,Decompressive lumbar laminotomy with Lumbar Three to Four, Lumbar Four to Five, Posterior lumbar interbody fusion/Lumbar Three to Five posterolateral arthrodesis (N/A) CYSTOSCOPY (N/A)  Anesthesia type: general  Patient location: PACU  Post pain: Pain level controlled  Post assessment: Patient's Cardiovascular Status Stable  Last Vitals:  Filed Vitals:   09/20/12 1704  BP:   Pulse: 74  Temp:   Resp: 17    Post vital signs: Reviewed and stable  Level of consciousness: sedated  Complications: No apparent anesthesia complications

## 2012-09-20 NOTE — Op Note (Signed)
Preoperative diagnosis:  1. Difficult urethral catheterization secondary to urethral stricture  Postoperative diagnosis: 1. Difficult urethral catheterization secondary to urethral stricture  Procedure(s): 1. Cystoscopy 2.  Balloon dilation of urethral stricture 3.  Placement of 18 French council tip catheter over a guidewire  Surgeon: Dr. Rolly Salter, Jr  Anesthesia: General  Complications: None  EBL: Minimal  Indication: I was called to the operating room by Dr. Newell Coral after attempts were made to place a Foley catheter prior to his neurosurgical procedure by the nursing staff and Dr. Newell Coral unsuccessfully.  Daryl Webb is a patient of Dr. Retta Diones who had a radical prostatectomy over 10 years ago by Dr. Larey Dresser and does have a known urethral stricture which has required balloon dilation in the past.  Daryl Webb has been managing his stricture with periodic catheterizations with an 89 French red rubber catheter according to his wife. He does require a urethral catheter due to the length of this procedure which will require urine output monitoring and may place him at risk for postoperative urinary retention.  Description of procedure:  The patient had been placed under general anesthesia and had been administered preoperative antibiotics. His genitalia was prepped in the usual sterile fashion.  An attempt to place a 15 French catheter was unsuccessful by myself.  I therefore performed flexible cystoscopy.  This revealed a small pinpoint bulbar urethral stricture.  I passed a 0.38 sensor guidewire through the stricture and this passed easily into the bladder.  A 24 French Ultraxx nephrostomy balloon was then placed over the guidewire and through the urethral stricture without significant resistance.  The balloon was then inflated to 24 [place] at 17 atm.  It was left inflated for approximately 3 minutes and then deflated.  I then passed an 47 [place] council tip catheter  over the wire and into the bladder.  The catheter did not immediately return urine.  It was therefore irrigated and irrigated quantitatively with subsequent return of grossly clear urine.  The procedure was then turned back over to Dr. Newell Coral.   I did speak with Mrs. Mula and discussed the situation in detail with her.  I recommended that Daryl Webb be discharged home with a Foley catheter to be left in place for approximately 1 week with a subsequent voiding trial with Dr. Retta Diones at that time.

## 2012-09-20 NOTE — Preoperative (Deleted)
Beta Blockers   Reason not to administer Beta Blockers:Not Applicable 

## 2012-09-20 NOTE — Preoperative (Signed)
Beta Blockers   Reason not to administer Beta Blockers:Toprol XL taken this am

## 2012-09-21 ENCOUNTER — Encounter (HOSPITAL_COMMUNITY): Payer: Self-pay | Admitting: Neurosurgery

## 2012-09-21 LAB — GLUCOSE, CAPILLARY
Glucose-Capillary: 164 mg/dL — ABNORMAL HIGH (ref 70–99)
Glucose-Capillary: 228 mg/dL — ABNORMAL HIGH (ref 70–99)
Glucose-Capillary: 191 mg/dL — ABNORMAL HIGH (ref 70–99)
Glucose-Capillary: 200 mg/dL — ABNORMAL HIGH (ref 70–99)

## 2012-09-21 MED FILL — Heparin Sodium (Porcine) Inj 1000 Unit/ML: INTRAMUSCULAR | Qty: 30 | Status: AC

## 2012-09-21 MED FILL — Sodium Chloride IV Soln 0.9%: INTRAVENOUS | Qty: 3000 | Status: AC

## 2012-09-21 NOTE — Progress Notes (Signed)
Filed Vitals:   09/20/12 1934 09/20/12 2355 09/21/12 0346 09/21/12 0813  BP: 109/66 144/85 132/71 136/51  Pulse: 76 79 81 85  Temp: 97.6 F (36.4 C) 98.4 F (36.9 C) 98.4 F (36.9 C) 98.7 F (37.1 C)  TempSrc: Oral Oral Oral   Resp: 18 20 18 18   SpO2: 98% 99% 98% 99%    Patient comfortable in bed. Dressing clean and dry. Moving all 4 extremities well. Foley to straight drainage.  Plan: We'll begin to mobilize, but gradually raising head of bed this morning, and progressing to ambulation actively in the halls. We'll leave Foley in place until patient follows up with Dr. Hillis Range next week.  Hewitt Shorts, MD 09/21/2012, 8:59 AM

## 2012-09-21 NOTE — Progress Notes (Signed)
Inpatient Diabetes Program Recommendations  AACE/ADA: New Consensus Statement on Inpatient Glycemic Control (2013)  Target Ranges:  Prepandial:   less than 140 mg/dL      Peak postprandial:   less than 180 mg/dL (1-2 hours)      Critically ill patients:  140 - 180 mg/dL   Results for TORELL, MINDER (MRN [id number]) as of 09/21/2012 10:48  Ref. Range 09/20/2012 07:40 09/20/2012 16:05 09/20/2012 19:34 09/20/2012 21:36 09/21/2012 08:12  Glucose-Capillary Latest Range: 70-99 mg/dL 308 (H) 657 (H) 846 (H) 217 (H) 164 (H)    Inpatient Diabetes Program Recommendations Correction (SSI): Please consider ordering CBGs ACHS with Novolog sensitive correction scale while inpatient.  Note: Patient has a history of diabetes and takes Metformin 1000 mg BID at home for diabetes management.  Currently, patient is ordered to receive Metformin 1000 mg BID for inpatient glycemic control.  Blood glucose over the past 24 hours ranged from 164-217 mg/dl.  Please consider ordering CBGs ACHS Novolog sensitive correction scale while inpatient to improve inpatient glycemic control.  Will continue to follow.  Thanks, Orlando Penner, RN, MSN, CCRN Diabetes Coordinator Inpatient Diabetes Program [phone]

## 2012-09-21 NOTE — Progress Notes (Signed)
Utilization review completed.  

## 2012-09-22 LAB — GLUCOSE, CAPILLARY
Glucose-Capillary: 145 mg/dL — ABNORMAL HIGH (ref 70–99)
Glucose-Capillary: 208 mg/dL — ABNORMAL HIGH (ref 70–99)
Glucose-Capillary: 186 mg/dL — ABNORMAL HIGH (ref 70–99)
Glucose-Capillary: 173 mg/dL — ABNORMAL HIGH (ref 70–99)

## 2012-09-22 NOTE — Progress Notes (Signed)
Filed Vitals:   09/21/12 1630 09/21/12 1936 09/21/12 2343 09/22/12 0339  BP: 149/83 130/63 112/63 83/52  Pulse: 73 84 75 75  Temp: 98.5 F (36.9 C) 98.8 F (37.1 C) 99.3 F (37.4 C) 99.1 F (37.3 C)  TempSrc:  Oral Oral Oral  Resp: 18 18 18 16   SpO2: 92% 93% 94% 94%    Patient making steady progress, gradually increasing ambulation. Did use rolling walker yesterday, hopefully today we'll be able to progress to a bleeding on his own. Foley to straight drainage.  Plan: Continued to progress through postoperative recovery.  Hewitt Shorts, MD 09/22/2012, 7:35 AM

## 2012-09-23 LAB — GLUCOSE, CAPILLARY: Glucose-Capillary: 163 mg/dL — ABNORMAL HIGH (ref 70–99)

## 2012-09-23 MED ORDER — OXYCODONE-ACETAMINOPHEN 5-325 MG PO TABS: 1.0000 | ORAL_TABLET | ORAL | Status: DC | PRN

## 2012-09-23 MED ORDER — CYCLOBENZAPRINE HCL 10 MG PO TABS: 5.0000 mg | ORAL_TABLET | Freq: Three times a day (TID) | ORAL | Status: DC | PRN

## 2012-09-23 NOTE — Progress Notes (Addendum)
Pt. Alert and oriented,follows simple instructions, denies pain. Incision area without swelling, redness or S/S of infection. Voiding adequate clear yellow urine. Moving all extremities well and vitals stable and documented. Lumbar surgery notes instructions given to patient and family member for home safety and precautions. Pt. and family stated understanding of instructions given. RN provided patient and spouse with care and instructions on foley catheter. Extra supplies also given for home.

## 2012-09-23 NOTE — Discharge Summary (Signed)
Physician Discharge Summary  Patient ID: Daryl Webb MRN: [id number] DOB/AGE: 67-Jul-1947 67 y.o.  Admit date: 09/20/2012 Discharge date: 09/23/2012  Admission Diagnoses:  Multilevel multifactorial Lumbar stenosis, dynamic degenerative Spondylolisthesis, Lumbar degenerative disc disease, Lumbar spondylosis, neurogenic claudication  Discharge Diagnoses:  1)Multilevel multifactorial Lumbar stenosis, dynamic degenerative Spondylolisthesis, Lumbar degenerative disc disease, Lumbar spondylosis, neurogenic claudication 2)  Difficult urethral catheterization secondary to urethral stricture    Active Problems:   * No active hospital problems. *  Discharged Condition: good  Hospital Course: Patient was admitted, underwent an L3-L5 lumbar decompression, PLIF, and PLA. Prior to surgery, we were unable to pass a Foley catheter, therefore Dr. Heloise Purpura from [facility] was consulted, he performed a cystoscopy, balloon dilation of urethral stricture, and placement of a 18 French catheter.   Postoperatively he has been up and ambulating. He does have mild stiffness, and has used Flexeril occasionally. Dr. Laverle Patter instructed that the Foley catheter should be left in place, and the patient should be discharged home with the catheter, and that he should followup with his urologist Dr. Dartha Lodge early next week.  His lumbar  wound is healing well. There is no erythema, swelling, ecchymosis, or drainage. He's been given instructions regarding wound care and activities following discharge. He is to return for followup with me in 3 weeks.  Consults: urology :  Dr. Crecencio Mc  Discharge Exam: Blood pressure 117/59, pulse 84, temperature 99.5 F (37.5 C), temperature source Oral, resp. rate 18, SpO2 93.00%.  Disposition:  home    Future Appointments Provider Department Dept Phone   11/20/2012 8:20 AM [facility] Main Office [place]) [phone]   11/23/2012 8:30 AM  Cassell Clement, MD [place] [facility] Main Office [place]) [phone]       Medication List         amLODipine-atorvastatin 10-40 MG per tablet  Commonly known as:  CADUET  Take 1 tablet by mouth every evening.     aspirin EC 81 MG tablet  Take 81 mg by mouth daily.     Cholecalciferol 2000 UNITS Caps  Take 1 capsule by mouth daily.     CoQ10 100 MG Caps  Take 100 mg by mouth daily.     cyclobenzaprine 10 MG tablet  Commonly known as:  FLEXERIL  Take 0.5-1 tablets (5-10 mg total) by mouth 3 (three) times daily as needed for muscle spasms.     Fish Oil 1000 MG Caps  Take 1,000 mg by mouth daily.     loratadine 10 MG tablet  Commonly known as:  CLARITIN  Take 5 mg by mouth daily. As needed     losartan-hydrochlorothiazide 50-12.5 MG per tablet  Commonly known as:  HYZAAR  Take 1 tablet by mouth daily.     metFORMIN 1000 MG tablet  Commonly known as:  GLUCOPHAGE  Take 1 tablet (1,000 mg total) by mouth 2 (two) times daily with a meal.     metoprolol succinate 50 MG 24 hr tablet  Commonly known as:  TOPROL-XL  Take 1 tablet (50 mg total) by mouth daily. Take with or immediately following a meal.     omeprazole 40 MG capsule  Commonly known as:  PRILOSEC  Take 1 capsule (40 mg total) by mouth daily.     OVER THE COUNTER MEDICATION  Take 1.2 mg by mouth daily. Energizing iron by enzymatic therapy (heme iron with vitamin b12) (2 softgels equal 1.2 mg)     OVER THE COUNTER MEDICATION  Take 1 lozenge by mouth daily. 50 mg vitamin b6, 400 mcg vitamin b12, 400 mcg folic acid     oxyCODONE-acetaminophen 5-325 MG per tablet  Commonly known as:  PERCOCET/ROXICET  Take 1-2 tablets by mouth every 4 (four) hours as needed for pain.         Signed: Hewitt Shorts, MD 09/23/2012, 7:47 AM

## 2012-09-25 DIAGNOSIS — N35919 Unspecified urethral stricture, male, unspecified site: Secondary | ICD-10-CM | POA: Diagnosis not present

## 2012-09-25 DIAGNOSIS — R339 Retention of urine, unspecified: Secondary | ICD-10-CM | POA: Diagnosis not present

## 2012-10-04 DIAGNOSIS — R82998 Other abnormal findings in urine: Secondary | ICD-10-CM | POA: Diagnosis not present

## 2012-10-04 DIAGNOSIS — N390 Urinary tract infection, site not specified: Secondary | ICD-10-CM | POA: Diagnosis not present

## 2012-10-11 DIAGNOSIS — Z8546 Personal history of malignant neoplasm of prostate: Secondary | ICD-10-CM | POA: Diagnosis not present

## 2012-10-11 DIAGNOSIS — N35919 Unspecified urethral stricture, male, unspecified site: Secondary | ICD-10-CM | POA: Diagnosis not present

## 2012-10-13 DIAGNOSIS — M545 Low back pain, unspecified: Secondary | ICD-10-CM | POA: Diagnosis not present

## 2012-11-20 ENCOUNTER — Other Ambulatory Visit (INDEPENDENT_AMBULATORY_CARE_PROVIDER_SITE_OTHER): Payer: Medicare Other

## 2012-11-20 ENCOUNTER — Encounter (INDEPENDENT_AMBULATORY_CARE_PROVIDER_SITE_OTHER): Payer: Self-pay

## 2012-11-20 DIAGNOSIS — IMO0001 Reserved for inherently not codable concepts without codable children: Secondary | ICD-10-CM | POA: Diagnosis not present

## 2012-11-20 DIAGNOSIS — D500 Iron deficiency anemia secondary to blood loss (chronic): Secondary | ICD-10-CM | POA: Diagnosis not present

## 2012-11-20 LAB — CBC WITH DIFFERENTIAL/PLATELET
Basophils Absolute: 0 10*3/uL (ref 0.0–0.1)
Eosinophils Absolute: 0.3 10*3/uL (ref 0.0–0.7)
Eosinophils Relative: 3.7 % (ref 0.0–5.0)
HCT: 35 % — ABNORMAL LOW (ref 39.0–52.0)
Lymphs Abs: 2.6 10*3/uL (ref 0.7–4.0)
MCHC: 32.3 g/dL (ref 30.0–36.0)
MCV: 82.3 fl (ref 78.0–100.0)
Monocytes Absolute: 0.7 10*3/uL (ref 0.1–1.0)
Monocytes Relative: 8.1 % (ref 3.0–12.0)
Neutrophils Relative %: 56.2 % (ref 43.0–77.0)
Platelets: 334 10*3/uL (ref 150.0–400.0)
WBC: 8.3 10*3/uL (ref 4.5–10.5)

## 2012-11-20 LAB — LIPID PANEL
Cholesterol: 130 mg/dL (ref 0–200)
HDL: 34.7 mg/dL — ABNORMAL LOW (ref >39.00)
Triglycerides: 220 mg/dL — ABNORMAL HIGH (ref 0.0–149.0)
VLDL: 44 mg/dL — ABNORMAL HIGH (ref 0.0–40.0)

## 2012-11-20 LAB — BASIC METABOLIC PANEL
Chloride: 106 mEq/L (ref 96–112)
GFR: 70.08 mL/min (ref >60.00)
Potassium: 3.7 mEq/L (ref 3.5–5.1)

## 2012-11-20 LAB — LDL CHOLESTEROL, DIRECT: Direct LDL: 73.8 mg/dL

## 2012-11-20 LAB — HEPATIC FUNCTION PANEL
ALT: 22 U/L (ref 0–53)
Albumin: 3.7 g/dL (ref 3.5–5.2)
Total Protein: 7.2 g/dL (ref 6.0–8.3)

## 2012-11-20 LAB — HEMOGLOBIN A1C: Hgb A1c MFr Bld: 8.3 % — ABNORMAL HIGH (ref 4.6–6.5)

## 2012-11-23 ENCOUNTER — Encounter: Payer: Self-pay | Admitting: Cardiology

## 2012-11-23 ENCOUNTER — Encounter (INDEPENDENT_AMBULATORY_CARE_PROVIDER_SITE_OTHER): Payer: Self-pay

## 2012-11-23 ENCOUNTER — Ambulatory Visit (INDEPENDENT_AMBULATORY_CARE_PROVIDER_SITE_OTHER): Payer: Medicare Other | Admitting: Cardiology

## 2012-11-23 VITALS — BP 156/85 | HR 81 | Ht 67.0 in | Wt 181.0 lb

## 2012-11-23 DIAGNOSIS — I119 Hypertensive heart disease without heart failure: Secondary | ICD-10-CM

## 2012-11-23 DIAGNOSIS — E119 Type 2 diabetes mellitus without complications: Secondary | ICD-10-CM

## 2012-11-23 DIAGNOSIS — IMO0001 Reserved for inherently not codable concepts without codable children: Secondary | ICD-10-CM | POA: Diagnosis not present

## 2012-11-23 DIAGNOSIS — E7800 Pure hypercholesterolemia, unspecified: Secondary | ICD-10-CM

## 2012-11-23 DIAGNOSIS — D500 Iron deficiency anemia secondary to blood loss (chronic): Secondary | ICD-10-CM

## 2012-11-23 MED ORDER — METFORMIN HCL 1000 MG PO TABS: 1000.0000 mg | ORAL_TABLET | Freq: Two times a day (BID) | ORAL | Status: DC

## 2012-11-23 NOTE — Assessment & Plan Note (Signed)
Blood pressure today is elevated.  This may be spuriously but he does not check his blood pressures at home.  I have advised him to buy an Omron blood pressure cuff and check his blood pressure periodically.  He will let us know if it remains elevated.

## 2012-11-23 NOTE — Patient Instructions (Signed)
Work harder on careful diet and weight loss  Your physician recommends that you continue on your current medications as directed. Please refer to the Current Medication list given to you today.  Your physician wants you to follow-up in: 4 months with fasting labs (lp/bmet/hfp/cbc/a1c)  You will receive a reminder letter in the mail two months in advance. If you don't receive a letter, please call our office to schedule the follow-up appointment.

## 2012-11-23 NOTE — Assessment & Plan Note (Signed)
The patient has not been having any hypoglycemic episodes.  His hemoglobin A1c has gone back up to his last visit and he has not been as careful with his diet.

## 2012-11-23 NOTE — Assessment & Plan Note (Signed)
The patient has had no further episodes of melena.  Hemoglobin is down slightly.  We will recheck his next visit.  Some of this may be postoperative.  He is back to taking diclofenac once a day for his arthritis

## 2012-11-23 NOTE — Progress Notes (Signed)
Daryl Webb Date of Birth:  1945/05/14 [facility] [address] [place], [place]  [address] [phone]         Fax   [phone]  History of Present Illness: This pleasant 67 year old gentleman is seen for a followup office visit. He has a history of essential hypertension, hypercholesterolemia, and diabetes. He also has a past history of prostate cancer and has a urethral stricture. He has had osteoarthritis and has had a previous right total knee replacement. He has had problems with exogenous obesity.  At his last visit he was found to have iron deficiency anemia and a history of melena and was subsequently endoscoped and was found to have a peptic ulcer.  He has had a good response to omeprazole.  At his last office visit he was experiencing severe back pain.  Since we last saw him he has had successful back surgery by Dr. Newell Coral.  He is still having to wear a back brace until the end of November but he is no longer having pain.     Current Outpatient Prescriptions  Medication Sig Dispense Refill  . amLODipine-atorvastatin (CADUET) 10-40 MG per tablet Take 1 tablet by mouth every evening.      Marland Kitchen aspirin EC 81 MG tablet Take 81 mg by mouth daily.      . Cholecalciferol 2000 UNITS CAPS Take 1 capsule by mouth daily. VITAMIN D 3      . Coenzyme Q10 (COQ10) 100 MG CAPS Take 100 mg by mouth daily.      . cyclobenzaprine (FLEXERIL) 10 MG tablet Take 0.5-1 tablets (5-10 mg total) by mouth 3 (three) times daily as needed for muscle spasms.  90 tablet  1  . loratadine (CLARITIN) 10 MG tablet Take 5 mg by mouth daily. As needed      . losartan-hydrochlorothiazide (HYZAAR) 50-12.5 MG per tablet Take 1 tablet by mouth daily.      . metFORMIN (GLUCOPHAGE) 1000 MG tablet Take 1 tablet (1,000 mg total) by mouth 2 (two) times daily with a meal.  14 tablet  3  . metoprolol succinate (TOPROL-XL) 50 MG 24 hr tablet Take 1 tablet (50 mg total) by mouth daily. Take with or  immediately following a meal.  90 tablet  3  . NON FORMULARY Pt is back on his voltren he is taking 1 tab daily      . Omega-3 Fatty Acids (FISH OIL) 1000 MG CAPS Take 1,000 mg by mouth daily.      Marland Kitchen omeprazole (PRILOSEC) 40 MG capsule Take 1 capsule (40 mg total) by mouth daily.  90 capsule  1  . OVER THE COUNTER MEDICATION Take 1.2 mg by mouth daily. Energizing iron by enzymatic therapy (heme iron with vitamin b12) (2 softgels equal 1.2 mg)      . OVER THE COUNTER MEDICATION Take 1 lozenge by mouth daily. 50 mg vitamin b6, 400 mcg vitamin b12, 400 mcg folic acid      . oxyCODONE-acetaminophen (PERCOCET/ROXICET) 5-325 MG per tablet Take 1-2 tablets by mouth every 4 (four) hours as needed for pain.  100 tablet  0   No current facility-administered medications for this visit.    No Known Allergies  Patient Active Problem List   Diagnosis Date Noted  . Low back pain 07/20/2012  . Iron deficiency anemia due to chronic blood loss 07/20/2012  . Acute posthemorrhagic anemia 06/27/2012  . Melena 06/19/2012  . Dyspnea 06/19/2012  . Type II or unspecified type  diabetes mellitus without mention of complication, uncontrolled 09/01/2011  . Heart murmur 04/14/2011  . Benign hypertensive heart disease without heart failure 08/17/2010  . Hypercholesterolemia   . History of prostate cancer   . History of urethral stricture   . H/O: osteoarthritis     History  Smoking status  . Former Smoker -- 1.50 packs/day for 20 years  . Types: Cigarettes  . Quit date: 02/09/1980  Smokeless tobacco  . Never Used    History  Alcohol Use No    Family History  Problem Relation Age of Onset  . Heart disease Mother   . Heart failure Mother   . Heart disease Father   . Diabetes Father     Review of Systems: Constitutional: no fever chills diaphoresis or fatigue or change in weight.  Head and neck: no hearing loss, no epistaxis, no photophobia or visual disturbance. Respiratory: No cough, shortness  of breath or wheezing. Cardiovascular: No chest pain peripheral edema, palpitations. Gastrointestinal: No abdominal distention, no abdominal pain, no change in bowel habits hematochezia or melena. Genitourinary: No dysuria, no frequency, no urgency, no nocturia. Musculoskeletal:No arthralgias, no back pain, no gait disturbance or myalgias. Neurological: No dizziness, no headaches, no numbness, no seizures, no syncope, no weakness, no tremors. Hematologic: No lymphadenopathy, no easy bruising. Psychiatric: No confusion, no hallucinations, no sleep disturbance.    Physical Exam: Filed Vitals:   11/23/12 0855  BP: 156/85  Pulse: 81     Assessment / Plan:      Current Outpatient Prescriptions  Medication Sig Dispense Refill  . amLODipine-atorvastatin (CADUET) 10-40 MG per tablet Take 1 tablet by mouth every evening.      Marland Kitchen aspirin EC 81 MG tablet Take 81 mg by mouth daily.      . Cholecalciferol 2000 UNITS CAPS Take 1 capsule by mouth daily. VITAMIN D 3      . Coenzyme Q10 (COQ10) 100 MG CAPS Take 100 mg by mouth daily.      . cyclobenzaprine (FLEXERIL) 10 MG tablet Take 0.5-1 tablets (5-10 mg total) by mouth 3 (three) times daily as needed for muscle spasms.  90 tablet  1  . loratadine (CLARITIN) 10 MG tablet Take 5 mg by mouth daily. As needed      . losartan-hydrochlorothiazide (HYZAAR) 50-12.5 MG per tablet Take 1 tablet by mouth daily.      . metFORMIN (GLUCOPHAGE) 1000 MG tablet Take 1 tablet (1,000 mg total) by mouth 2 (two) times daily with a meal.  14 tablet  3  . metoprolol succinate (TOPROL-XL) 50 MG 24 hr tablet Take 1 tablet (50 mg total) by mouth daily. Take with or immediately following a meal.  90 tablet  3  . NON FORMULARY Pt is back on his voltren he is taking 1 tab daily      . Omega-3 Fatty Acids (FISH OIL) 1000 MG CAPS Take 1,000 mg by mouth daily.      Marland Kitchen omeprazole (PRILOSEC) 40 MG capsule Take 1 capsule (40 mg total) by mouth daily.  90 capsule  1  . OVER THE  COUNTER MEDICATION Take 1.2 mg by mouth daily. Energizing iron by enzymatic therapy (heme iron with vitamin b12) (2 softgels equal 1.2 mg)      . OVER THE COUNTER MEDICATION Take 1 lozenge by mouth daily. 50 mg vitamin b6, 400 mcg vitamin b12, 400 mcg folic acid      . oxyCODONE-acetaminophen (PERCOCET/ROXICET) 5-325 MG per tablet Take 1-2 tablets by mouth every  4 (four) hours as needed for pain.  100 tablet  0   No current facility-administered medications for this visit.    No Known Allergies  Patient Active Problem List   Diagnosis Date Noted  . Low back pain 07/20/2012  . Iron deficiency anemia due to chronic blood loss 07/20/2012  . Acute posthemorrhagic anemia 06/27/2012  . Melena 06/19/2012  . Dyspnea 06/19/2012  . Type II or unspecified type diabetes mellitus without mention of complication, uncontrolled 09/01/2011  . Heart murmur 04/14/2011  . Benign hypertensive heart disease without heart failure 08/17/2010  . Hypercholesterolemia   . History of prostate cancer   . History of urethral stricture   . H/O: osteoarthritis     History  Smoking status  . Former Smoker -- 1.50 packs/day for 20 years  . Types: Cigarettes  . Quit date: 02/09/1980  Smokeless tobacco  . Never Used    History  Alcohol Use No    Family History  Problem Relation Age of Onset  . Heart disease Mother   . Heart failure Mother   . Heart disease Father   . Diabetes Father     Review of Systems: Constitutional: no fever chills diaphoresis or fatigue or change in weight.  Head and neck: no hearing loss, no epistaxis, no photophobia or visual disturbance. Respiratory: No cough, shortness of breath or wheezing. Cardiovascular: No chest pain peripheral edema, palpitations. Gastrointestinal: No abdominal distention, no abdominal pain, no change in bowel habits hematochezia or melena. Genitourinary: No dysuria, no frequency, no urgency, no nocturia. Musculoskeletal:No arthralgias, no back pain,  no gait disturbance or myalgias. Neurological: No dizziness, no headaches, no numbness, no seizures, no syncope, no weakness, no tremors. Hematologic: No lymphadenopathy, no easy bruising. Psychiatric: No confusion, no hallucinations, no sleep disturbance.    Physical Exam: Filed Vitals:   11/23/12 0855  BP: 156/85  Pulse: 81   the general appearance reveals a well-developed well-nourished middle-aged gentleman in no distress.The head and neck exam reveals pupils equal and reactive.  Extraocular movements are full.  There is no scleral icterus.  The mouth and pharynx are normal.  The neck is supple.  The carotids reveal no bruits.  The jugular venous pressure is normal.  The  thyroid is not enlarged.  There is no lymphadenopathy.  The chest is clear to percussion and auscultation.  There are no rales or rhonchi.  Expansion of the chest is symmetrical.  The precordium is quiet.  The first heart sound is normal.  The second heart sound is physiologically split.  There is no  gallop rub or click.  There is a soft systolic ejection murmur at the base.  There is no abnormal lift or heave.  The abdomen is soft and nontender.  The bowel sounds are normal.  The liver and spleen are not enlarged.  There are no abdominal masses.  There are no abdominal bruits.  Extremities reveal good pedal pulses.  There is no phlebitis or edema.  There is no cyanosis or clubbing.  Strength is normal and symmetrical in all extremities.  There is no lateralizing weakness.  There are no sensory deficits.  The skin is warm and dry.  There is no rash.     Assessment / Plan: Continue same medication.  Continue omeprazole once a day.  Continue baby aspirin once a day.  He has not been experiencing any chest pain or angina.  He'll be rechecked in 4 months for followup office visit CBC hemoglobin A1c lipid  panel hepatic function panel and basal metabolic panel

## 2012-12-14 DIAGNOSIS — Z23 Encounter for immunization: Secondary | ICD-10-CM | POA: Diagnosis not present

## 2012-12-15 DIAGNOSIS — M48062 Spinal stenosis, lumbar region with neurogenic claudication: Secondary | ICD-10-CM | POA: Diagnosis not present

## 2012-12-15 DIAGNOSIS — M48061 Spinal stenosis, lumbar region without neurogenic claudication: Secondary | ICD-10-CM | POA: Diagnosis not present

## 2012-12-15 DIAGNOSIS — M4310 Spondylolisthesis, site unspecified: Secondary | ICD-10-CM | POA: Diagnosis not present

## 2012-12-15 DIAGNOSIS — M5137 Other intervertebral disc degeneration, lumbosacral region: Secondary | ICD-10-CM | POA: Diagnosis not present

## 2012-12-27 DIAGNOSIS — D234 Other benign neoplasm of skin of scalp and neck: Secondary | ICD-10-CM | POA: Diagnosis not present

## 2012-12-27 DIAGNOSIS — L723 Sebaceous cyst: Secondary | ICD-10-CM | POA: Diagnosis not present

## 2012-12-27 DIAGNOSIS — Z85828 Personal history of other malignant neoplasm of skin: Secondary | ICD-10-CM | POA: Diagnosis not present

## 2012-12-27 DIAGNOSIS — D1801 Hemangioma of skin and subcutaneous tissue: Secondary | ICD-10-CM | POA: Diagnosis not present

## 2012-12-27 DIAGNOSIS — L570 Actinic keratosis: Secondary | ICD-10-CM | POA: Diagnosis not present

## 2012-12-27 DIAGNOSIS — K130 Diseases of lips: Secondary | ICD-10-CM | POA: Diagnosis not present

## 2012-12-27 DIAGNOSIS — L821 Other seborrheic keratosis: Secondary | ICD-10-CM | POA: Diagnosis not present

## 2013-02-02 ENCOUNTER — Other Ambulatory Visit: Payer: Self-pay | Admitting: Cardiology

## 2013-02-02 NOTE — Telephone Encounter (Signed)
RX REQUEST FOR PRILOSEC

## 2013-02-05 DIAGNOSIS — M545 Low back pain, unspecified: Secondary | ICD-10-CM | POA: Diagnosis not present

## 2013-02-05 DIAGNOSIS — N390 Urinary tract infection, site not specified: Secondary | ICD-10-CM | POA: Diagnosis not present

## 2013-02-08 HISTORY — PX: LUMBAR LAMINECTOMY: SHX95

## 2013-02-13 DIAGNOSIS — M47817 Spondylosis without myelopathy or radiculopathy, lumbosacral region: Secondary | ICD-10-CM | POA: Diagnosis not present

## 2013-02-13 DIAGNOSIS — E669 Obesity, unspecified: Secondary | ICD-10-CM | POA: Diagnosis not present

## 2013-02-13 DIAGNOSIS — I10 Essential (primary) hypertension: Secondary | ICD-10-CM | POA: Diagnosis not present

## 2013-02-13 DIAGNOSIS — M5137 Other intervertebral disc degeneration, lumbosacral region: Secondary | ICD-10-CM | POA: Diagnosis not present

## 2013-02-13 DIAGNOSIS — M48062 Spinal stenosis, lumbar region with neurogenic claudication: Secondary | ICD-10-CM | POA: Diagnosis not present

## 2013-03-23 DIAGNOSIS — M48062 Spinal stenosis, lumbar region with neurogenic claudication: Secondary | ICD-10-CM | POA: Diagnosis not present

## 2013-03-23 DIAGNOSIS — M4310 Spondylolisthesis, site unspecified: Secondary | ICD-10-CM | POA: Diagnosis not present

## 2013-03-23 DIAGNOSIS — I10 Essential (primary) hypertension: Secondary | ICD-10-CM | POA: Diagnosis not present

## 2013-03-23 DIAGNOSIS — E669 Obesity, unspecified: Secondary | ICD-10-CM | POA: Diagnosis not present

## 2013-03-26 ENCOUNTER — Other Ambulatory Visit (INDEPENDENT_AMBULATORY_CARE_PROVIDER_SITE_OTHER): Payer: Medicare Other

## 2013-03-26 DIAGNOSIS — IMO0001 Reserved for inherently not codable concepts without codable children: Secondary | ICD-10-CM

## 2013-03-26 DIAGNOSIS — I119 Hypertensive heart disease without heart failure: Secondary | ICD-10-CM

## 2013-03-26 DIAGNOSIS — D500 Iron deficiency anemia secondary to blood loss (chronic): Secondary | ICD-10-CM

## 2013-03-26 DIAGNOSIS — E1165 Type 2 diabetes mellitus with hyperglycemia: Principal | ICD-10-CM

## 2013-03-26 LAB — CBC WITH DIFFERENTIAL/PLATELET
BASOS ABS: 0 10*3/uL (ref 0.0–0.1)
Basophils Relative: 0.5 % (ref 0.0–3.0)
EOS PCT: 5.1 % — AB (ref 0.0–5.0)
Eosinophils Absolute: 0.4 10*3/uL (ref 0.0–0.7)
HEMATOCRIT: 37.8 % — AB (ref 39.0–52.0)
HEMOGLOBIN: 11.8 g/dL — AB (ref 13.0–17.0)
LYMPHS ABS: 1.9 10*3/uL (ref 0.7–4.0)
Lymphocytes Relative: 22.5 % (ref 12.0–46.0)
MCHC: 31.2 g/dL (ref 30.0–36.0)
MCV: 81.9 fl (ref 78.0–100.0)
MONO ABS: 0.5 10*3/uL (ref 0.1–1.0)
MONOS PCT: 6.3 % (ref 3.0–12.0)
NEUTROS ABS: 5.4 10*3/uL (ref 1.4–7.7)
Neutrophils Relative %: 65.6 % (ref 43.0–77.0)
PLATELETS: 284 10*3/uL (ref 150.0–400.0)
RBC: 4.62 Mil/uL (ref 4.22–5.81)
RDW: 21.6 % — AB (ref 11.5–14.6)
WBC: 8.2 10*3/uL (ref 4.5–10.5)

## 2013-03-26 LAB — HEPATIC FUNCTION PANEL
ALT: 29 U/L (ref 0–53)
AST: 22 U/L (ref 0–37)
Albumin: 3.7 g/dL (ref 3.5–5.2)
Alkaline Phosphatase: 82 U/L (ref 39–117)
Bilirubin, Direct: 0 mg/dL (ref 0.0–0.3)
Total Bilirubin: 0.4 mg/dL (ref 0.3–1.2)
Total Protein: 7.1 g/dL (ref 6.0–8.3)

## 2013-03-26 LAB — BASIC METABOLIC PANEL
BUN: 15 mg/dL (ref 6–23)
CO2: 24 mEq/L (ref 19–32)
Calcium: 8.9 mg/dL (ref 8.4–10.5)
Chloride: 106 mEq/L (ref 96–112)
Creatinine, Ser: 1.1 mg/dL (ref 0.4–1.5)
GFR: 69.29 mL/min (ref >60.00)
Glucose, Bld: 172 mg/dL — ABNORMAL HIGH (ref 70–99)
POTASSIUM: 3.8 meq/L (ref 3.5–5.1)
SODIUM: 139 meq/L (ref 135–145)

## 2013-03-26 LAB — LIPID PANEL
CHOL/HDL RATIO: 4
Cholesterol: 137 mg/dL (ref 0–200)
HDL: 35.5 mg/dL — AB (ref >39.00)
LDL Cholesterol: 73 mg/dL (ref 0–99)
Triglycerides: 144 mg/dL (ref 0.0–149.0)
VLDL: 28.8 mg/dL (ref 0.0–40.0)

## 2013-03-26 LAB — HEMOGLOBIN A1C: HEMOGLOBIN A1C: 8.9 % — AB (ref 4.6–6.5)

## 2013-03-27 NOTE — Progress Notes (Signed)
Quick Note:  Please make copy of labs for patient visit. ______ 

## 2013-03-28 ENCOUNTER — Ambulatory Visit: Payer: Medicare Other | Admitting: Cardiology

## 2013-04-13 ENCOUNTER — Ambulatory Visit (INDEPENDENT_AMBULATORY_CARE_PROVIDER_SITE_OTHER): Payer: Medicare Other | Admitting: Cardiology

## 2013-04-13 ENCOUNTER — Encounter: Payer: Self-pay | Admitting: Cardiology

## 2013-04-13 VITALS — BP 157/83 | HR 76 | Ht 67.0 in | Wt 178.0 lb

## 2013-04-13 DIAGNOSIS — IMO0001 Reserved for inherently not codable concepts without codable children: Secondary | ICD-10-CM

## 2013-04-13 DIAGNOSIS — E7800 Pure hypercholesterolemia, unspecified: Secondary | ICD-10-CM

## 2013-04-13 DIAGNOSIS — D62 Acute posthemorrhagic anemia: Secondary | ICD-10-CM

## 2013-04-13 DIAGNOSIS — I119 Hypertensive heart disease without heart failure: Secondary | ICD-10-CM

## 2013-04-13 DIAGNOSIS — R011 Cardiac murmur, unspecified: Secondary | ICD-10-CM | POA: Diagnosis not present

## 2013-04-13 DIAGNOSIS — E1165 Type 2 diabetes mellitus with hyperglycemia: Principal | ICD-10-CM

## 2013-04-13 NOTE — Progress Notes (Signed)
Daryl Webb Date of Birth:  October 17, 1945 [address] [place], [place]  [address] [phone]         Fax   [phone]  History of Present Illness: This pleasant 68 year old gentleman is seen for a followup office visit. He has a history of essential hypertension, hypercholesterolemia, and diabetes. He also has a past history of prostate cancer and has a urethral stricture. He has had osteoarthritis and has had a previous right total knee replacement. He has had problems with exogenous obesity.  At his last visit he was found to have iron deficiency anemia and a history of melena and was subsequently endoscoped and was found to have a peptic ulcer.  He has had a good response to omeprazole.  At his last office visit he was experiencing severe back pain.  Since we last saw him he has had successful back surgery by Dr. Sherwood Gambler.  He is not having any significant back pain now. He has a history of high blood pressure.  Blood pressure today her in the office was elevated.  He will monitor his blood pressure at home.  He will cut back further on salt.     Current Outpatient Prescriptions  Medication Sig Dispense Refill  . amLODipine-atorvastatin (CADUET) 5-40 MG per tablet TAKE 1 TABLET DAILY  90 tablet  2  . aspirin EC 81 MG tablet Take 81 mg by mouth daily.      . Cholecalciferol 2000 UNITS CAPS Take 1 capsule by mouth daily. VITAMIN D 3      . Coenzyme Q10 (COQ10) 100 MG CAPS Take 100 mg by mouth daily.      . diclofenac (VOLTAREN) 75 MG EC tablet Take 75 mg by mouth daily.      Marland Kitchen loratadine (CLARITIN) 10 MG tablet Take 5 mg by mouth daily. As needed      . losartan-hydrochlorothiazide (HYZAAR) 50-12.5 MG per tablet Take 1 tablet by mouth daily.      . metFORMIN (GLUCOPHAGE) 1000 MG tablet Take 1 tablet (1,000 mg total) by mouth 2 (two) times daily with a meal.  14 tablet  3  . metoprolol succinate (TOPROL-XL) 50 MG 24 hr tablet TAKE 1 TABLET DAILY TAKE WITH OR  IMMEDIATELY FOLLOWING A MEAL  90 tablet  2  . NON FORMULARY Pt is back on his voltren he is taking 1 tab daily      . Omega-3 Fatty Acids (FISH OIL) 1000 MG CAPS Take 1,000 mg by mouth daily.      Marland Kitchen omeprazole (PRILOSEC) 40 MG capsule every other day      . OVER THE COUNTER MEDICATION Take 1.2 mg by mouth daily. Energizing iron by enzymatic therapy (heme iron with vitamin b12) (2 softgels equal 1.2 mg)      . OVER THE COUNTER MEDICATION Take 1 lozenge by mouth daily. 50 mg vitamin b6, 400 mcg vitamin [id number], [id number] mcg folic acid       No current facility-administered medications for this visit.    No Known Allergies  Patient Active Problem List   Diagnosis Date Noted  . Low back pain 07/20/2012  . Iron deficiency anemia due to chronic blood loss 07/20/2012  . Acute posthemorrhagic anemia 06/27/2012  . Melena 06/19/2012  . Dyspnea 06/19/2012  . Type II or unspecified type diabetes mellitus without mention of complication, uncontrolled 09/01/2011  . Heart murmur 04/14/2011  . Benign hypertensive heart disease without heart failure 08/17/2010  . Hypercholesterolemia   .  History of prostate cancer   . History of urethral stricture   . H/O: osteoarthritis     History  Smoking status  . Former Smoker -- 1.50 packs/day for 20 years  . Types: Cigarettes  . Quit date: 02/09/1980  Smokeless tobacco  . Never Used    History  Alcohol Use No    Family History  Problem Relation Age of Onset  . Heart disease Mother   . Heart failure Mother   . Heart disease Father   . Diabetes Father     Review of Systems: Constitutional: no fever chills diaphoresis or fatigue or change in weight.  Head and neck: no hearing loss, no epistaxis, no photophobia or visual disturbance. Respiratory: No cough, shortness of breath or wheezing. Cardiovascular: No chest pain peripheral edema, palpitations. Gastrointestinal: No abdominal distention, no abdominal pain, no change in bowel habits hematochezia or  melena. Genitourinary: No dysuria, no frequency, no urgency, no nocturia. Musculoskeletal:No arthralgias, no back pain, no gait disturbance or myalgias. Neurological: No dizziness, no headaches, no numbness, no seizures, no syncope, no weakness, no tremors. Hematologic: No lymphadenopathy, no easy bruising. Psychiatric: No confusion, no hallucinations, no sleep disturbance.    Physical Exam: Filed Vitals:   04/13/13 0923  BP: 157/83  Pulse: 76     Assessment / Plan:      Current Outpatient Prescriptions  Medication Sig Dispense Refill  . amLODipine-atorvastatin (CADUET) 5-40 MG per tablet TAKE 1 TABLET DAILY  90 tablet  2  . aspirin EC 81 MG tablet Take 81 mg by mouth daily.      . Cholecalciferol 2000 UNITS CAPS Take 1 capsule by mouth daily. VITAMIN D 3      . Coenzyme Q10 (COQ10) 100 MG CAPS Take 100 mg by mouth daily.      . diclofenac (VOLTAREN) 75 MG EC tablet Take 75 mg by mouth daily.      Marland Kitchen loratadine (CLARITIN) 10 MG tablet Take 5 mg by mouth daily. As needed      . losartan-hydrochlorothiazide (HYZAAR) 50-12.5 MG per tablet Take 1 tablet by mouth daily.      . metFORMIN (GLUCOPHAGE) 1000 MG tablet Take 1 tablet (1,000 mg total) by mouth 2 (two) times daily with a meal.  14 tablet  3  . metoprolol succinate (TOPROL-XL) 50 MG 24 hr tablet TAKE 1 TABLET DAILY TAKE WITH OR IMMEDIATELY FOLLOWING A MEAL  90 tablet  2  . NON FORMULARY Pt is back on his voltren he is taking 1 tab daily      . Omega-3 Fatty Acids (FISH OIL) 1000 MG CAPS Take 1,000 mg by mouth daily.      Marland Kitchen omeprazole (PRILOSEC) 40 MG capsule every other day      . OVER THE COUNTER MEDICATION Take 1.2 mg by mouth daily. Energizing iron by enzymatic therapy (heme iron with vitamin b12) (2 softgels equal 1.2 mg)      . OVER THE COUNTER MEDICATION Take 1 lozenge by mouth daily. 50 mg vitamin b6, 400 mcg vitamin W10, 272 mcg folic acid       No current facility-administered medications for this visit.    No  Known Allergies  Patient Active Problem List   Diagnosis Date Noted  . Low back pain 07/20/2012  . Iron deficiency anemia due to chronic blood loss 07/20/2012  . Acute posthemorrhagic anemia 06/27/2012  . Melena 06/19/2012  . Dyspnea 06/19/2012  . Type II or unspecified type diabetes mellitus without mention of  complication, uncontrolled 09/01/2011  . Heart murmur 04/14/2011  . Benign hypertensive heart disease without heart failure 08/17/2010  . Hypercholesterolemia   . History of prostate cancer   . History of urethral stricture   . H/O: osteoarthritis     History  Smoking status  . Former Smoker -- 1.50 packs/day for 20 years  . Types: Cigarettes  . Quit date: 02/09/1980  Smokeless tobacco  . Never Used    History  Alcohol Use No    Family History  Problem Relation Age of Onset  . Heart disease Mother   . Heart failure Mother   . Heart disease Father   . Diabetes Father     Review of Systems: Constitutional: no fever chills diaphoresis or fatigue or change in weight.  Head and neck: no hearing loss, no epistaxis, no photophobia or visual disturbance. Respiratory: No cough, shortness of breath or wheezing. Cardiovascular: No chest pain peripheral edema, palpitations. Gastrointestinal: No abdominal distention, no abdominal pain, no change in bowel habits hematochezia or melena. Genitourinary: No dysuria, no frequency, no urgency, no nocturia. Musculoskeletal:No arthralgias, no back pain, no gait disturbance or myalgias. Neurological: No dizziness, no headaches, no numbness, no seizures, no syncope, no weakness, no tremors. Hematologic: No lymphadenopathy, no easy bruising. Psychiatric: No confusion, no hallucinations, no sleep disturbance.    Physical Exam: Filed Vitals:   04/13/13 0923  BP: 157/83  Pulse: 76   the general appearance reveals a well-developed well-nourished middle-aged gentleman in no distress.The head and neck exam reveals pupils equal and  reactive.  Extraocular movements are full.  There is no scleral icterus.  The mouth and pharynx are normal.  The neck is supple.  The carotids reveal no bruits.  The jugular venous pressure is normal.  The  thyroid is not enlarged.  There is no lymphadenopathy.  The chest is clear to percussion and auscultation.  There are no rales or rhonchi.  Expansion of the chest is symmetrical.  The precordium is quiet.  The first heart sound is normal.  The second heart sound is physiologically split.  There is no  gallop rub or click.  There is a soft systolic ejection murmur at the base.  There is no abnormal lift or heave.  The abdomen is soft and nontender.  The bowel sounds are normal.  The liver and spleen are not enlarged.  There are no abdominal masses.  There are no abdominal bruits.  Extremities reveal good pedal pulses.  There is no phlebitis or edema.  There is no cyanosis or clubbing.  Strength is normal and symmetrical in all extremities.  There is no lateralizing weakness.  There are no sensory deficits.  The skin is warm and dry.  There is no rash.     Assessment / Plan: Continue same medication.  Decrease to omeprazole to just every other day.  Continue baby aspirin once a day.  He has not been experiencing any chest pain or angina.  He'll be rechecked in 4 months for followup office visit, EKG, CBC hemoglobin A1c lipid panel hepatic function panel and basal metabolic panel.  We will update his echocardiogram in the next several weeks to follow his aortic valve disease. Cut back on carbohydrates and also cut back on sodium.  Monitor blood pressure at home.

## 2013-04-13 NOTE — Assessment & Plan Note (Signed)
The patient has not had any exertional chest pain.  No symptoms of CHF.  No peripheral edema.

## 2013-04-13 NOTE — Assessment & Plan Note (Signed)
The patient is not having any melena.  He does have 3 or 4 loose bowel movements each morning which appears to be his pattern.  Has a remote history of peptic ulcer disease.  He is asking to stop his omeprazole.  We will try cutting back to just every other day.

## 2013-04-13 NOTE — Assessment & Plan Note (Signed)
His most recent A1c is higher.  He has lost 3 pounds since last visit and is exercising more.  However he is still eating too many biscuits and too much bread in his diet.  He does not want to go on insulin.  He will continue same metformin and work harder on diet and weight loss.

## 2013-04-13 NOTE — Assessment & Plan Note (Signed)
The patient is not having any new symptoms from his aortic stenosis which were shown to be mild 2 years ago.  We will update his echocardiogram.  His murmur is somewhat louder today although his carotid upstroke is still brisk.

## 2013-04-13 NOTE — Patient Instructions (Signed)
DECREASE YOUR OMEPRAZOLE TO EVERY OTHER DAY  Your physician has requested that you have an echocardiogram. Echocardiography is a painless test that uses sound waves to create images of your heart. It provides your doctor with information about the size and shape of your heart and how well your heart's chambers and valves are working. This procedure takes approximately one hour. There are no restrictions for this procedure.  Your physician wants you to follow-up in: 4 months with fasting labs (lp/bmet/hfp/cbc/a1c) and ekg  You will receive a reminder letter in the mail two months in advance. If you don't receive a letter, please call our office to schedule the follow-up appointment.

## 2013-04-19 ENCOUNTER — Other Ambulatory Visit: Payer: Self-pay | Admitting: Cardiology

## 2013-05-01 ENCOUNTER — Ambulatory Visit (HOSPITAL_COMMUNITY): Payer: Medicare Other | Attending: Cardiology | Admitting: Radiology

## 2013-05-01 DIAGNOSIS — R011 Cardiac murmur, unspecified: Secondary | ICD-10-CM

## 2013-05-01 NOTE — Progress Notes (Signed)
Echocardiogram performed.  

## 2013-05-07 ENCOUNTER — Telehealth: Payer: Self-pay | Admitting: *Deleted

## 2013-05-07 ENCOUNTER — Encounter: Payer: Self-pay | Admitting: Cardiology

## 2013-05-07 NOTE — Telephone Encounter (Signed)
Advised patient

## 2013-05-07 NOTE — Telephone Encounter (Signed)
Message copied by Earvin Hansen on Mon May 07, 2013  9:11 AM ------      Message from: Darlin Coco      Created: Tue May 01, 2013  7:43 PM       Please report.  LV systolic function is good.  The aortic valve shows only mild aortic stenosis. CSD ------

## 2013-05-12 ENCOUNTER — Other Ambulatory Visit: Payer: Self-pay | Admitting: Cardiology

## 2013-06-26 ENCOUNTER — Other Ambulatory Visit: Payer: Self-pay | Admitting: Dermatology

## 2013-06-26 DIAGNOSIS — L723 Sebaceous cyst: Secondary | ICD-10-CM | POA: Diagnosis not present

## 2013-06-26 DIAGNOSIS — L570 Actinic keratosis: Secondary | ICD-10-CM | POA: Diagnosis not present

## 2013-06-26 DIAGNOSIS — K130 Diseases of lips: Secondary | ICD-10-CM | POA: Diagnosis not present

## 2013-06-26 DIAGNOSIS — C4441 Basal cell carcinoma of skin of scalp and neck: Secondary | ICD-10-CM | POA: Diagnosis not present

## 2013-06-26 DIAGNOSIS — L821 Other seborrheic keratosis: Secondary | ICD-10-CM | POA: Diagnosis not present

## 2013-06-26 DIAGNOSIS — D1801 Hemangioma of skin and subcutaneous tissue: Secondary | ICD-10-CM | POA: Diagnosis not present

## 2013-06-26 DIAGNOSIS — Z85828 Personal history of other malignant neoplasm of skin: Secondary | ICD-10-CM | POA: Diagnosis not present

## 2013-06-26 DIAGNOSIS — L259 Unspecified contact dermatitis, unspecified cause: Secondary | ICD-10-CM | POA: Diagnosis not present

## 2013-06-26 DIAGNOSIS — C44519 Basal cell carcinoma of skin of other part of trunk: Secondary | ICD-10-CM | POA: Diagnosis not present

## 2013-08-13 ENCOUNTER — Other Ambulatory Visit (INDEPENDENT_AMBULATORY_CARE_PROVIDER_SITE_OTHER): Payer: Medicare Other

## 2013-08-13 DIAGNOSIS — IMO0001 Reserved for inherently not codable concepts without codable children: Secondary | ICD-10-CM | POA: Diagnosis not present

## 2013-08-13 DIAGNOSIS — E7800 Pure hypercholesterolemia, unspecified: Secondary | ICD-10-CM | POA: Diagnosis not present

## 2013-08-13 DIAGNOSIS — I119 Hypertensive heart disease without heart failure: Secondary | ICD-10-CM

## 2013-08-13 DIAGNOSIS — E1165 Type 2 diabetes mellitus with hyperglycemia: Principal | ICD-10-CM

## 2013-08-13 LAB — LIPID PANEL
Cholesterol: 123 mg/dL (ref 0–200)
HDL: 32.8 mg/dL — ABNORMAL LOW (ref >39.00)
LDL CALC: 58 mg/dL (ref 0–99)
NONHDL: 90.2
Total CHOL/HDL Ratio: 4
Triglycerides: 162 mg/dL — ABNORMAL HIGH (ref 0.0–149.0)
VLDL: 32.4 mg/dL (ref 0.0–40.0)

## 2013-08-13 LAB — CBC WITH DIFFERENTIAL/PLATELET
BASOS ABS: 0 10*3/uL (ref 0.0–0.1)
BASOS PCT: 0.3 % (ref 0.0–3.0)
EOS ABS: 0.3 10*3/uL (ref 0.0–0.7)
Eosinophils Relative: 3.3 % (ref 0.0–5.0)
HEMATOCRIT: 41.6 % (ref 39.0–52.0)
HEMOGLOBIN: 13.8 g/dL (ref 13.0–17.0)
LYMPHS ABS: 2.3 10*3/uL (ref 0.7–4.0)
LYMPHS PCT: 27.6 % (ref 12.0–46.0)
MCHC: 33.2 g/dL (ref 30.0–36.0)
MCV: 88 fl (ref 78.0–100.0)
MONO ABS: 0.7 10*3/uL (ref 0.1–1.0)
Monocytes Relative: 7.9 % (ref 3.0–12.0)
NEUTROS ABS: 5 10*3/uL (ref 1.4–7.7)
Neutrophils Relative %: 60.9 % (ref 43.0–77.0)
Platelets: 268 10*3/uL (ref 150.0–400.0)
RBC: 4.73 Mil/uL (ref 4.22–5.81)
RDW: 19.3 % — AB (ref 11.5–15.5)
WBC: 8.2 10*3/uL (ref 4.0–10.5)

## 2013-08-13 LAB — BASIC METABOLIC PANEL
BUN: 19 mg/dL (ref 6–23)
CO2: 22 meq/L (ref 19–32)
Calcium: 9.3 mg/dL (ref 8.4–10.5)
Chloride: 106 mEq/L (ref 96–112)
Creatinine, Ser: 1.4 mg/dL (ref 0.4–1.5)
GFR: 55.32 mL/min — ABNORMAL LOW (ref >60.00)
Glucose, Bld: 213 mg/dL — ABNORMAL HIGH (ref 70–99)
POTASSIUM: 4.3 meq/L (ref 3.5–5.1)
SODIUM: 138 meq/L (ref 135–145)

## 2013-08-13 LAB — HEPATIC FUNCTION PANEL
ALT: 34 U/L (ref 0–53)
AST: 30 U/L (ref 0–37)
Albumin: 3.9 g/dL (ref 3.5–5.2)
Alkaline Phosphatase: 81 U/L (ref 39–117)
BILIRUBIN TOTAL: 0.3 mg/dL (ref 0.2–1.2)
Bilirubin, Direct: 0 mg/dL (ref 0.0–0.3)
Total Protein: 7.4 g/dL (ref 6.0–8.3)

## 2013-08-13 LAB — HEMOGLOBIN A1C: Hgb A1c MFr Bld: 9.9 % — ABNORMAL HIGH (ref 4.6–6.5)

## 2013-08-13 NOTE — Progress Notes (Signed)
Quick Note:  Please make copy of labs for patient visit. ______ 

## 2013-08-16 ENCOUNTER — Ambulatory Visit (INDEPENDENT_AMBULATORY_CARE_PROVIDER_SITE_OTHER): Payer: Medicare Other | Admitting: Cardiology

## 2013-08-16 ENCOUNTER — Encounter: Payer: Self-pay | Admitting: Cardiology

## 2013-08-16 VITALS — BP 140/84 | HR 79 | Ht 67.0 in | Wt 184.0 lb

## 2013-08-16 DIAGNOSIS — I119 Hypertensive heart disease without heart failure: Secondary | ICD-10-CM | POA: Diagnosis not present

## 2013-08-16 DIAGNOSIS — K921 Melena: Secondary | ICD-10-CM | POA: Diagnosis not present

## 2013-08-16 DIAGNOSIS — E1165 Type 2 diabetes mellitus with hyperglycemia: Principal | ICD-10-CM

## 2013-08-16 DIAGNOSIS — E7800 Pure hypercholesterolemia, unspecified: Secondary | ICD-10-CM

## 2013-08-16 DIAGNOSIS — IMO0001 Reserved for inherently not codable concepts without codable children: Secondary | ICD-10-CM | POA: Diagnosis not present

## 2013-08-16 NOTE — Patient Instructions (Addendum)
Your physician recommends that you continue on your current medications as directed. Please refer to the Current Medication list given to you today.  Your physician wants you to follow-up in: 4 months with fasting labs (lp/bmet/hfp/a1c) You will receive a reminder letter in the mail two months in advance. If you don't receive a letter, please call our office to schedule the follow-up appointment.   Will arrange an appointment with Dr Buddy Duty, they will call you an appointment

## 2013-08-16 NOTE — Assessment & Plan Note (Signed)
His hemoglobin is stable.  He has not been aware of any recent melena or hematochezia or symptoms of recurrent peptic ulcer

## 2013-08-16 NOTE — Assessment & Plan Note (Signed)
Blood pressure is borderline elevated today.  He is not having dizzy spells or syncope.  Now that he has not been having some much back pain he is attempting to do more walking for exercise.  Last week he went hiking in [place].  He did not have any exertional chest symptoms and he enjoyed the hike.

## 2013-08-16 NOTE — Progress Notes (Addendum)
Daryl Webb Date of Birth:  December 03, 1945 [facility] [address] [place], [place]  [address] [phone]        Fax   [phone]   History of Present Illness: This pleasant 68 year old gentleman is seen for a followup office visit. He has a history of essential hypertension, hypercholesterolemia, and diabetes. He also has a past history of prostate cancer and has a urethral stricture. He has had osteoarthritis and has had a previous right total knee replacement. He has had problems with exogenous obesity.  At a previous visit he was found to have iron deficiency anemia and a history of melena and was subsequently endoscoped and was found to have a peptic ulcer. He has had a good response to omeprazole. At his last office visit he was experiencing severe back pain. Since we last saw him he has had successful back surgery by Dr. Sherwood Gambler. He is not having any significant back pain now.  He has had difficulty with his weight.  His weight is up 6 pounds since last visit.   Current Outpatient Prescriptions  Medication Sig Dispense Refill  . amLODipine-atorvastatin (CADUET) 5-40 MG per tablet TAKE 1 TABLET DAILY  90 tablet  2  . aspirin EC 81 MG tablet Take 81 mg by mouth daily.      . Cholecalciferol 2000 UNITS CAPS Take 1 capsule by mouth daily. VITAMIN D 3      . Coenzyme Q10 (COQ10) 100 MG CAPS Take 100 mg by mouth daily.      . diclofenac (VOLTAREN) 75 MG EC tablet Take 75 mg by mouth daily.      Marland Kitchen loratadine (CLARITIN) 10 MG tablet Take 5 mg by mouth daily. As needed      . losartan-hydrochlorothiazide (HYZAAR) 50-12.5 MG per tablet TAKE 1 TABLET DAILY  90 tablet  1  . metFORMIN (GLUCOPHAGE) 1000 MG tablet Take 1 tablet (1,000 mg total) by mouth 2 (two) times daily with a meal.  180 tablet  3  . metoprolol succinate (TOPROL-XL) 50 MG 24 hr tablet TAKE 1 TABLET DAILY TAKE WITH OR IMMEDIATELY FOLLOWING A MEAL  90 tablet  2  . NON FORMULARY Pt is back on his  voltren he is taking 1 tab daily      . Omega-3 Fatty Acids (FISH OIL) 1000 MG CAPS Take 1,000 mg by mouth daily.      Marland Kitchen omeprazole (PRILOSEC) 40 MG capsule every other day      . omeprazole (PRILOSEC) 40 MG capsule Take 1 capsule (40 mg total) by mouth every other day.  45 capsule  1  . OVER THE COUNTER MEDICATION Take 1.2 mg by mouth daily. Energizing iron by enzymatic therapy (heme iron with vitamin b12) (2 softgels equal 1.2 mg)      . OVER THE COUNTER MEDICATION Take 1 lozenge by mouth daily. 50 mg vitamin b6, 400 mcg vitamin N27, 782 mcg folic acid       No current facility-administered medications for this visit.    No Known Allergies  Patient Active Problem List   Diagnosis Date Noted  . Low back pain 07/20/2012  . Iron deficiency anemia due to chronic blood loss 07/20/2012  . Acute posthemorrhagic anemia 06/27/2012  . Melena 06/19/2012  . Dyspnea 06/19/2012  . Type II or unspecified type diabetes mellitus without mention of complication, uncontrolled 09/01/2011  . Heart murmur 04/14/2011  . Benign hypertensive heart disease without heart failure 08/17/2010  . Hypercholesterolemia   .  History of prostate cancer   . History of urethral stricture   . H/O: osteoarthritis     History  Smoking status  . Former Smoker -- 1.50 packs/day for 20 years  . Types: Cigarettes  . Quit date: 02/09/1980  Smokeless tobacco  . Never Used    History  Alcohol Use No    Family History  Problem Relation Age of Onset  . Heart disease Mother   . Heart failure Mother   . Heart disease Father   . Diabetes Father     Review of Systems: Constitutional: no fever chills diaphoresis or fatigue or change in weight.  Head and neck: no hearing loss, no epistaxis, no photophobia or visual disturbance. Respiratory: No cough, shortness of breath or wheezing. Cardiovascular: No chest pain peripheral edema, palpitations. Gastrointestinal: No abdominal distention, no abdominal pain, no change  in bowel habits hematochezia or melena. Genitourinary: No dysuria, no frequency, no urgency, no nocturia. Musculoskeletal:No arthralgias, no back pain, no gait disturbance or myalgias. Neurological: No dizziness, no headaches, no numbness, no seizures, no syncope, no weakness, no tremors. Hematologic: No lymphadenopathy, no easy bruising. Psychiatric: No confusion, no hallucinations, no sleep disturbance.    Physical Exam: Filed Vitals:   08/16/13 0930  BP: 140/84  Pulse:    the general appearance reveals a well-developed well-nourished gentleman in no distress.  He is overweight.The head and neck exam reveals pupils equal and reactive.  Extraocular movements are full.  There is no scleral icterus.  The mouth and pharynx are normal.  The neck is supple.  The carotids reveal no bruits.  The jugular venous pressure is normal.  The  thyroid is not enlarged.  There is no lymphadenopathy.  The chest is clear to percussion and auscultation.  There are no rales or rhonchi.  Expansion of the chest is symmetrical.  The precordium is quiet.  The first heart sound is normal.  The second heart sound is physiologically split.  There is soft systolic ejection murmur at the aortic area.  No diastolic murmur.  There is no abnormal lift or heave.  The abdomen is soft and nontender.  The bowel sounds are normal.  The liver and spleen are not enlarged.  There are no abdominal masses.  There are no abdominal bruits.  Extremities reveal good pedal pulses.  There is no phlebitis or edema.  There is no cyanosis or clubbing.  Strength is normal and symmetrical in all extremities.  There is no lateralizing weakness.  There are no sensory deficits.  The skin is warm and dry.  There is no rash.  EKG shows normal sinus rhythm with nonspecific ST-T wave changes improved since 06/19/12  Assessment / Plan: 1. essential hypertension without heart failure 2. Hypercholesterolemia 3. adult-onset diabetes mellitus type 2, poorly  controlled with recent A1c of 9.9 4. past history of prostate cancer. 5. history of urethral stricture 6. past history of peptic ulcer with melena and iron deficiency anemia 7. low back pain resolved with surgery  Plan: Continue same medication.  Work harder on diet and weight loss.  Referred to Dr. Dagmar Hait for diabetes management. Return here in 4 months for office visit CBC lipid panel hepatic function panel basal metabolic panel and Q5Z

## 2013-08-16 NOTE — Assessment & Plan Note (Signed)
He has had essentially total relief of his low back pain following the surgery by Dr. Sherwood Gambler last year.

## 2013-08-16 NOTE — Assessment & Plan Note (Signed)
The patient is not doing well in regard to his diabetes.  His weight is up 6 pounds.  His hemoglobin A1c has increased to 9.9. He'll continue current dose of metformin.  We will refer to Dr. Dagmar Hait for diabetes care.  I have asked him to [employer] .  He has been drinking some sodas and have asked him to stop drinking sodas.

## 2013-09-21 DIAGNOSIS — M4310 Spondylolisthesis, site unspecified: Secondary | ICD-10-CM | POA: Diagnosis not present

## 2013-09-21 DIAGNOSIS — Z6828 Body mass index (BMI) 28.0-28.9, adult: Secondary | ICD-10-CM | POA: Diagnosis not present

## 2013-09-21 DIAGNOSIS — I10 Essential (primary) hypertension: Secondary | ICD-10-CM | POA: Diagnosis not present

## 2013-09-21 DIAGNOSIS — M47817 Spondylosis without myelopathy or radiculopathy, lumbosacral region: Secondary | ICD-10-CM | POA: Diagnosis not present

## 2013-09-21 DIAGNOSIS — M5137 Other intervertebral disc degeneration, lumbosacral region: Secondary | ICD-10-CM | POA: Diagnosis not present

## 2013-10-27 ENCOUNTER — Other Ambulatory Visit: Payer: Self-pay | Admitting: Cardiology

## 2013-11-20 ENCOUNTER — Other Ambulatory Visit: Payer: Self-pay | Admitting: Cardiology

## 2013-12-14 DIAGNOSIS — Z8546 Personal history of malignant neoplasm of prostate: Secondary | ICD-10-CM | POA: Diagnosis not present

## 2013-12-14 DIAGNOSIS — N359 Urethral stricture, unspecified: Secondary | ICD-10-CM | POA: Diagnosis not present

## 2013-12-25 DIAGNOSIS — Z23 Encounter for immunization: Secondary | ICD-10-CM | POA: Diagnosis not present

## 2013-12-25 DIAGNOSIS — E119 Type 2 diabetes mellitus without complications: Secondary | ICD-10-CM | POA: Diagnosis not present

## 2013-12-25 DIAGNOSIS — E1165 Type 2 diabetes mellitus with hyperglycemia: Secondary | ICD-10-CM | POA: Diagnosis not present

## 2013-12-27 ENCOUNTER — Other Ambulatory Visit (INDEPENDENT_AMBULATORY_CARE_PROVIDER_SITE_OTHER): Payer: Medicare Other | Admitting: *Deleted

## 2013-12-27 DIAGNOSIS — E7800 Pure hypercholesterolemia, unspecified: Secondary | ICD-10-CM

## 2013-12-27 DIAGNOSIS — I119 Hypertensive heart disease without heart failure: Secondary | ICD-10-CM

## 2013-12-27 DIAGNOSIS — E1165 Type 2 diabetes mellitus with hyperglycemia: Secondary | ICD-10-CM

## 2013-12-27 DIAGNOSIS — IMO0002 Reserved for concepts with insufficient information to code with codable children: Secondary | ICD-10-CM

## 2013-12-27 LAB — BASIC METABOLIC PANEL
BUN: 19 mg/dL (ref 6–23)
CO2: 23 mEq/L (ref 19–32)
Calcium: 9.2 mg/dL (ref 8.4–10.5)
Chloride: 106 mEq/L (ref 96–112)
Creatinine, Ser: 1.2 mg/dL (ref 0.4–1.5)
GFR: 65.73 mL/min (ref >60.00)
GLUCOSE: 213 mg/dL — AB (ref 70–99)
Potassium: 3.8 mEq/L (ref 3.5–5.1)
SODIUM: 137 meq/L (ref 135–145)

## 2013-12-27 LAB — HEPATIC FUNCTION PANEL
ALT: 38 U/L (ref 0–53)
AST: 27 U/L (ref 0–37)
Albumin: 3.9 g/dL (ref 3.5–5.2)
Alkaline Phosphatase: 82 U/L (ref 39–117)
BILIRUBIN TOTAL: 0.5 mg/dL (ref 0.2–1.2)
Bilirubin, Direct: 0 mg/dL (ref 0.0–0.3)
Total Protein: 7.1 g/dL (ref 6.0–8.3)

## 2013-12-27 LAB — LIPID PANEL
CHOLESTEROL: 125 mg/dL (ref 0–200)
HDL: 30.3 mg/dL — ABNORMAL LOW (ref >39.00)
LDL Cholesterol: 60 mg/dL (ref 0–99)
NonHDL: 94.7
Total CHOL/HDL Ratio: 4
Triglycerides: 173 mg/dL — ABNORMAL HIGH (ref 0.0–149.0)
VLDL: 34.6 mg/dL (ref 0.0–40.0)

## 2013-12-27 LAB — HEMOGLOBIN A1C: HEMOGLOBIN A1C: 9.8 % — AB (ref 4.6–6.5)

## 2013-12-27 NOTE — Progress Notes (Signed)
Quick Note:  Please make copy of labs for patient visit. ______ 

## 2013-12-28 ENCOUNTER — Other Ambulatory Visit: Payer: Self-pay | Admitting: Dermatology

## 2013-12-28 DIAGNOSIS — L570 Actinic keratosis: Secondary | ICD-10-CM | POA: Diagnosis not present

## 2013-12-28 DIAGNOSIS — D2261 Melanocytic nevi of right upper limb, including shoulder: Secondary | ICD-10-CM | POA: Diagnosis not present

## 2013-12-28 DIAGNOSIS — C44319 Basal cell carcinoma of skin of other parts of face: Secondary | ICD-10-CM | POA: Diagnosis not present

## 2013-12-28 DIAGNOSIS — L821 Other seborrheic keratosis: Secondary | ICD-10-CM | POA: Diagnosis not present

## 2013-12-28 DIAGNOSIS — D225 Melanocytic nevi of trunk: Secondary | ICD-10-CM | POA: Diagnosis not present

## 2013-12-28 DIAGNOSIS — Z85828 Personal history of other malignant neoplasm of skin: Secondary | ICD-10-CM | POA: Diagnosis not present

## 2013-12-28 DIAGNOSIS — D2262 Melanocytic nevi of left upper limb, including shoulder: Secondary | ICD-10-CM | POA: Diagnosis not present

## 2013-12-28 DIAGNOSIS — D1801 Hemangioma of skin and subcutaneous tissue: Secondary | ICD-10-CM | POA: Diagnosis not present

## 2014-01-01 ENCOUNTER — Ambulatory Visit (INDEPENDENT_AMBULATORY_CARE_PROVIDER_SITE_OTHER): Payer: Medicare Other | Admitting: Cardiology

## 2014-01-01 ENCOUNTER — Encounter: Payer: Self-pay | Admitting: Cardiology

## 2014-01-01 VITALS — BP 148/80 | HR 74 | Ht 67.0 in | Wt 183.0 lb

## 2014-01-01 DIAGNOSIS — D500 Iron deficiency anemia secondary to blood loss (chronic): Secondary | ICD-10-CM

## 2014-01-01 DIAGNOSIS — I119 Hypertensive heart disease without heart failure: Secondary | ICD-10-CM | POA: Diagnosis not present

## 2014-01-01 DIAGNOSIS — E0865 Diabetes mellitus due to underlying condition with hyperglycemia: Secondary | ICD-10-CM

## 2014-01-01 DIAGNOSIS — E7800 Pure hypercholesterolemia, unspecified: Secondary | ICD-10-CM

## 2014-01-01 DIAGNOSIS — R011 Cardiac murmur, unspecified: Secondary | ICD-10-CM

## 2014-01-01 NOTE — Assessment & Plan Note (Signed)
The patient has not been experiencing any symptoms from his aortic valve stenosis.

## 2014-01-01 NOTE — Assessment & Plan Note (Signed)
The patient has a past history of GI bleeding with iron deficiency anemia. He is no longer having any clinical symptoms of peptic ulcer disease. We will cut back on his iron tablets to just once a day and we will plan to recheck a CBC at his next visit. If normal we can probably stop iron tablets altogether

## 2014-01-01 NOTE — Patient Instructions (Signed)
DECREASE YOUR IRON TABLET TO JUST ONCE DAILY  Your physician recommends that you schedule a follow-up appointment in: 4 months with fasting labs (lp/bmet/hfp/cbc)

## 2014-01-01 NOTE — Assessment & Plan Note (Signed)
The patient is not having any chest pain or shortness of breath. No symptoms of CHF. Pressure remaining stable.

## 2014-01-01 NOTE — Assessment & Plan Note (Signed)
The patient is not having any hypoglycemic episodes. His recent A1c is 9.9. I would expect that it would be improved by next visit now that he has started to see Dr. Buddy Duty. The patient is motivated to stick to his diet and to lose weight.

## 2014-01-01 NOTE — Progress Notes (Signed)
Daryl Webb Date of Birth:  1945-07-29 [facility] [address] [place], [place]  [address] [phone]        Fax   [phone]   History of Present Illness: This pleasant 68 year old gentleman is seen for a followup office visit. He has a history of essential hypertension, hypercholesterolemia, and diabetes. He also has a past history of prostate cancer and has a urethral stricture. He has had osteoarthritis and has had a previous right total knee replacement. He has had problems with exogenous obesity.  At a previous visit he was found to have iron deficiency anemia and a history of melena and was subsequently endoscoped and was found to have a peptic ulcer. He has had a good response to omeprazole. At his last office visit he was experiencing severe back pain. Since we last saw him he has had successful back surgery by Dr. Sherwood Gambler. He is not having any significant back pain now.  He has had difficulty with his weight.  This time however his weight is down 1 pound since last visit.  The patient has started to see Dr. Buddy Duty for his diabetes.   Current Outpatient Prescriptions  Medication Sig Dispense Refill  . amLODipine-atorvastatin (CADUET) 5-40 MG per tablet TAKE 1 TABLET DAILY 90 tablet 1  . aspirin EC 81 MG tablet Take 81 mg by mouth daily.    Marland Kitchen BYDUREON 2 MG PEN   5  . Cholecalciferol 2000 UNITS CAPS Take 1 capsule by mouth daily. VITAMIN D 3    . Coenzyme Q10 (COQ10) 100 MG CAPS Take 100 mg by mouth daily.    . diclofenac (VOLTAREN) 75 MG EC tablet Take 75 mg by mouth daily.    Marland Kitchen loratadine (CLARITIN) 10 MG tablet Take 5 mg by mouth daily. As needed    . losartan-hydrochlorothiazide (HYZAAR) 50-12.5 MG per tablet TAKE 1 TABLET DAILY 90 tablet 1  . metFORMIN (GLUCOPHAGE) 1000 MG tablet Take 1 tablet (1,000 mg total) by mouth 2 (two) times daily with a meal. 180 tablet 3  . metoprolol succinate (TOPROL-XL) 50 MG 24 hr tablet TAKE 1 TABLET DAILY.  TAKE WITH OR IMMEDIATELY FOLLOWING A MEAL 90 tablet 1  . NON FORMULARY Pt is back on his voltren he is taking 1 tab daily    . Omega-3 Fatty Acids (FISH OIL) 1000 MG CAPS Take 1,000 mg by mouth daily.    Marland Kitchen omeprazole (PRILOSEC) 40 MG capsule every other day    . omeprazole (PRILOSEC) 40 MG capsule TAKE 1 CAPSULE (40 MG TOTAL) BY MOUTH EVERY OTHER DAY. 45 capsule 0  . OVER THE COUNTER MEDICATION Take by mouth daily. Energizing iron by enzymatic therapy (heme iron with vitamin b12) 1 softgel daily    . OVER THE COUNTER MEDICATION Take 1 lozenge by mouth daily. 50 mg vitamin b6, 400 mcg vitamin W29, 937 mcg folic acid     No current facility-administered medications for this visit.    No Known Allergies  Patient Active Problem List   Diagnosis Date Noted  . Low back pain 07/20/2012  . Iron deficiency anemia due to chronic blood loss 07/20/2012  . Acute posthemorrhagic anemia 06/27/2012  . Melena 06/19/2012  . Dyspnea 06/19/2012  . Type II or unspecified type diabetes mellitus without mention of complication, uncontrolled 09/01/2011  . Heart murmur 04/14/2011  . Benign hypertensive heart disease without heart failure 08/17/2010  . Hypercholesterolemia   . History of prostate cancer   .  History of urethral stricture   . H/O: osteoarthritis     History  Smoking status  . Former Smoker -- 1.50 packs/day for 20 years  . Types: Cigarettes  . Quit date: 02/09/1980  Smokeless tobacco  . Never Used    History  Alcohol Use No    Family History  Problem Relation Age of Onset  . Heart disease Mother   . Heart failure Mother   . Heart disease Father   . Diabetes Father     Review of Systems: Constitutional: no fever chills diaphoresis or fatigue or change in weight.  Head and neck: no hearing loss, no epistaxis, no photophobia or visual disturbance. Respiratory: No cough, shortness of breath or wheezing. Cardiovascular: No chest pain peripheral edema,  palpitations. Gastrointestinal: No abdominal distention, no abdominal pain, no change in bowel habits hematochezia or melena. Genitourinary: No dysuria, no frequency, no urgency, no nocturia. Musculoskeletal:No arthralgias, no back pain, no gait disturbance or myalgias. Neurological: No dizziness, no headaches, no numbness, no seizures, no syncope, no weakness, no tremors. Hematologic: No lymphadenopathy, no easy bruising. Psychiatric: No confusion, no hallucinations, no sleep disturbance.    Physical Exam: Filed Vitals:   01/01/14 0907  BP: 148/80  Pulse: 74   the general appearance reveals a well-developed well-nourished gentleman in no distress.  He is overweight.The head and neck exam reveals pupils equal and reactive.  Extraocular movements are full.  There is no scleral icterus.  The mouth and pharynx are normal.  The neck is supple.  The carotids reveal no bruits.  The jugular venous pressure is normal.  The  thyroid is not enlarged.  There is no lymphadenopathy.  The chest is clear to percussion and auscultation.  There are no rales or rhonchi.  Expansion of the chest is symmetrical.  The precordium is quiet.  The first heart sound is normal.  The second heart sound is physiologically split.  There is soft systolic ejection murmur at the aortic area.  No diastolic murmur.  There is no abnormal lift or heave.  The abdomen is soft and nontender.  The bowel sounds are normal.  The liver and spleen are not enlarged.  There are no abdominal masses.  There are no abdominal bruits.  Extremities reveal good pedal pulses.  There is no phlebitis or edema.  There is no cyanosis or clubbing.  Strength is normal and symmetrical in all extremities.  There is no lateralizing weakness.  There are no sensory deficits.  The skin is warm and dry.  There is no rash.  EKG shows normal sinus rhythm with nonspecific ST-T wave changes improved since  08/16/13  Assessment / Plan: 1. essential hypertension without  heart failure 2. Hypercholesterolemia 3. adult-onset diabetes mellitus type 2, poorly controlled with recent A1c of 9.8  Now seeing Dr. Buddy Duty. 4. past history of prostate cancer. 5. history of urethral stricture 6. past history of peptic ulcer with melena and iron deficiency anemia 7. low back pain resolved with surgery  Plan: Continue same medication.  Stop taking iron twice a day and continue just once a day.   Continued to work hard on diet and weight loss.   Return here in 4 months for office visit CBC lipid panel hepatic function panel basal metabolic panel and O0B. They will be spending the first 3 months of 2016 in [place]. They have an RV that they travel in.

## 2014-01-09 ENCOUNTER — Telehealth: Payer: Self-pay | Admitting: Cardiology

## 2014-01-09 NOTE — Telephone Encounter (Signed)
New Msg   Left vm for pt to inform of flu clinic Sat.

## 2014-01-16 ENCOUNTER — Other Ambulatory Visit: Payer: Self-pay | Admitting: Cardiology

## 2014-01-30 DIAGNOSIS — Z85828 Personal history of other malignant neoplasm of skin: Secondary | ICD-10-CM | POA: Diagnosis not present

## 2014-01-30 DIAGNOSIS — C44319 Basal cell carcinoma of skin of other parts of face: Secondary | ICD-10-CM | POA: Diagnosis not present

## 2014-02-20 DIAGNOSIS — Z23 Encounter for immunization: Secondary | ICD-10-CM | POA: Diagnosis not present

## 2014-02-20 DIAGNOSIS — E119 Type 2 diabetes mellitus without complications: Secondary | ICD-10-CM | POA: Diagnosis not present

## 2014-04-03 ENCOUNTER — Other Ambulatory Visit: Payer: Self-pay | Admitting: *Deleted

## 2014-04-03 MED ORDER — OMEPRAZOLE 40 MG PO CPDR: DELAYED_RELEASE_CAPSULE | ORAL | Status: DC

## 2014-04-16 ENCOUNTER — Encounter: Payer: Medicare Other | Attending: Internal Medicine | Admitting: *Deleted

## 2014-04-16 DIAGNOSIS — Z713 Dietary counseling and surveillance: Secondary | ICD-10-CM | POA: Diagnosis not present

## 2014-04-16 DIAGNOSIS — E119 Type 2 diabetes mellitus without complications: Secondary | ICD-10-CM | POA: Diagnosis not present

## 2014-04-16 NOTE — Patient Instructions (Addendum)
Plan:  Aim for 3 Carb Choices per meal (45 grams) +/- 1 either way  Aim for 0-15 Carbs per snack if hungry  Include protein in moderation with your meals and snacks Consider reading food labels for Total Carbohydrate and Fat Grams of foods Consider  increasing your activity level by walking for 39 minutes daily as tolerated Consider checking BG at alternate times per day to include FBS and 2hpp as directed by MD     #1 do fasting first thing in the morning before any food or drink    #2 Tuesday do 2 hours after first bite of lunch    #3  Two hours after first bite of dinner LOG ALL READINGS Continue taking medication as directed by MD  Diet Cranberry Juice is a good juice - Stay far away from the orange juice  *Have diabetic eye exam ASAP  Consider Lynnae Sandhoff or other brand reduced calorie bread

## 2014-04-17 ENCOUNTER — Encounter: Payer: Self-pay | Admitting: *Deleted

## 2014-04-17 NOTE — Progress Notes (Signed)
Diabetes Self-Management Education  Visit Type:  Initial DSME  Appt. Start Time: 0930 Appt. End Time: 1100  04/17/2014  [name], identified by name and date of birth, is a 69 y.o. male with a diagnosis of Diabetes: Type 2.  Other people present during visit:  Spouse/SO Upon arrival to my office the patients wife adamantly noted that managing his diabetes was his responsibility and she was not going to nag him. She has a long history with "Weight [employer]" and has tried to guide his nutrition with no success. There was overt tension.   ASSESSMENT  There were no vitals taken for this visit. There is no weight on file to calculate BMI.  Initial Visit Information:  Are you currently following a meal plan?: No Are you taking your medications as prescribed?: Yes Are you checking your feet?: No How often do you need to have someone help you when you read instructions, pamphlets, or other written materials from your doctor or pharmacy?: 1 - Never   Psychosocial:   Patient Belief/Attitude about Diabetes: Motivated to manage diabetes (moderately motivated) Self-care barriers: None Self-management support: Doctor's office, Family, CDE visits Other persons present: Spouse/SO Patient Concerns: Nutrition/Meal planning, Healthy Lifestyle Special Needs: None Preferred Learning Style: No preference indicated Learning Readiness: Contemplating  Complications:   Last HgB A1C per patient/outside source: 9.9 mg/dL How often do you check your blood sugar?:  (recommended 3X weekly, actually does 1-2Xweekly) Fasting Blood glucose range (mg/dL): 70-129 Postprandial Blood glucose range (mg/dL): 130-179 Have you had a dilated eye exam in the past 12 months?: No Have you had a dental exam in the past 12 months?: Yes  Diet Intake:  Breakfast: apples & grapes (fruit) / biscuit, country ham or sausage or bacon,  Snack (morning): none Lunch: sandwich mayo, dried beef, swiss cheese/ corned  beef sandwich/ vegets X3, dessert,/ meat, vegets, dessert Snack (afternoon): little Debbie, chips/dip Dinner: pork boneless chop, applesauce, corn, or green beans Snack (evening): ice cream 1C butter pecan/ neopolitan Beverage(s): coffee, equal, milk / water, diet soda, O'Douls (70calories), sweet tea 1X week  Exercise:  Exercise: ADL's (sedentary since retirement in 2002)  Individualized Plan for Diabetes Self-Management Training:   Learning Objective:  Patient will have a greater understanding of diabetes self-management. Patient education plan per assessed needs and concerns is to attend individual sessions      Education Topics Reviewed with Patient Today:  Definition of diabetes, type 1 and 2, and the diagnosis of diabetes, Factors that contribute to the development of diabetes, Explored patient's options for treatment of their diabetes Role of diet in the treatment of diabetes and the relationship between the three main macronutrients and blood glucose level, Food label reading, portion sizes and measuring food., Carbohydrate counting, Information on hints to eating out and maintain blood glucose control., Meal options for control of blood glucose level and chronic complications. Role of exercise on diabetes management, blood pressure control and cardiac health. Reviewed patients medication for diabetes, action, purpose, timing of dose and side effects. Yearly dilated eye exam Relationship between chronic complications and blood glucose control, Assessed and discussed foot care and prevention of foot problems, Dental care  PATIENTS GOALS/Plan (Developed by the patient):  Nutrition: General guidelines for healthy choices and portions discussed Physical Activity: Exercise 3-5 times per week, 30 minutes per day  Patient Instructions  Plan:  Aim for 3 Carb Choices per meal (45 grams) +/- 1 either way  Aim for 0-15 Carbs per snack if hungry  Include protein in moderation with your  meals and snacks Consider reading food labels for Total Carbohydrate and Fat Grams of foods Consider  increasing your activity level by walking for 39 minutes daily as tolerated Consider checking BG at alternate times per day to include FBS and 2hpp as directed by MD     #1 do fasting first thing in the morning before any food or drink    #2 Tuesday do 2 hours after first bite of lunch    #3  Two hours after first bite of dinner LOG ALL READINGS Continue taking medication as directed by MD  Diet Cranberry Juice is a good juice - Stay far away from the orange juice  *Have diabetic eye exam ASAP  Consider Lynnae Sandhoff or other brand reduced calorie bread    Expected Outcomes:  Other (comment) (Was attentive to education. However I question his degree of commitment  to change)  Education material provided: Living Well with Diabetes, A1C conversion sheet, Meal plan card, My Plate and Snack sheet  If problems or questions, patient to contact team via:  Phone  Future DSME appointment: PRN

## 2014-04-22 ENCOUNTER — Other Ambulatory Visit (INDEPENDENT_AMBULATORY_CARE_PROVIDER_SITE_OTHER): Payer: Medicare Other | Admitting: *Deleted

## 2014-04-22 DIAGNOSIS — I119 Hypertensive heart disease without heart failure: Secondary | ICD-10-CM

## 2014-04-22 DIAGNOSIS — E7800 Pure hypercholesterolemia, unspecified: Secondary | ICD-10-CM

## 2014-04-22 DIAGNOSIS — D500 Iron deficiency anemia secondary to blood loss (chronic): Secondary | ICD-10-CM | POA: Diagnosis not present

## 2014-04-22 LAB — BASIC METABOLIC PANEL
BUN: 18 mg/dL (ref 6–23)
CALCIUM: 9.4 mg/dL (ref 8.4–10.5)
CHLORIDE: 105 meq/L (ref 96–112)
CO2: 29 meq/L (ref 19–32)
CREATININE: 1.26 mg/dL (ref 0.40–1.50)
GFR: 60.29 mL/min (ref >60.00)
Glucose, Bld: 134 mg/dL — ABNORMAL HIGH (ref 70–99)
Potassium: 3.9 mEq/L (ref 3.5–5.1)
Sodium: 139 mEq/L (ref 135–145)

## 2014-04-22 LAB — LIPID PANEL
Cholesterol: 111 mg/dL (ref 0–200)
HDL: 26.8 mg/dL — AB (ref >39.00)
LDL Cholesterol: 52 mg/dL (ref 0–99)
NONHDL: 84.2
Total CHOL/HDL Ratio: 4
Triglycerides: 160 mg/dL — ABNORMAL HIGH (ref 0.0–149.0)
VLDL: 32 mg/dL (ref 0.0–40.0)

## 2014-04-22 LAB — CBC WITH DIFFERENTIAL/PLATELET
BASOS PCT: 0.4 % (ref 0.0–3.0)
Basophils Absolute: 0 10*3/uL (ref 0.0–0.1)
EOS PCT: 11.3 % — AB (ref 0.0–5.0)
Eosinophils Absolute: 1.1 10*3/uL — ABNORMAL HIGH (ref 0.0–0.7)
HCT: 41 % (ref 39.0–52.0)
HEMOGLOBIN: 13.6 g/dL (ref 13.0–17.0)
Lymphocytes Relative: 32.4 % (ref 12.0–46.0)
Lymphs Abs: 3.1 10*3/uL (ref 0.7–4.0)
MCHC: 33.3 g/dL (ref 30.0–36.0)
MCV: 96.6 fl (ref 78.0–100.0)
MONO ABS: 0.7 10*3/uL (ref 0.1–1.0)
Monocytes Relative: 7.2 % (ref 3.0–12.0)
NEUTROS ABS: 4.7 10*3/uL (ref 1.4–7.7)
Neutrophils Relative %: 48.7 % (ref 43.0–77.0)
Platelets: 264 10*3/uL (ref 150.0–400.0)
RBC: 4.24 Mil/uL (ref 4.22–5.81)
RDW: 15.4 % (ref 11.5–15.5)
WBC: 9.6 10*3/uL (ref 4.0–10.5)

## 2014-04-22 LAB — HEPATIC FUNCTION PANEL
ALBUMIN: 4 g/dL (ref 3.5–5.2)
ALK PHOS: 66 U/L (ref 39–117)
ALT: 30 U/L (ref 0–53)
AST: 24 U/L (ref 0–37)
Bilirubin, Direct: 0.1 mg/dL (ref 0.0–0.3)
TOTAL PROTEIN: 6.6 g/dL (ref 6.0–8.3)
Total Bilirubin: 0.4 mg/dL (ref 0.2–1.2)

## 2014-04-22 NOTE — Addendum Note (Signed)
Addended by: Eulis Foster on: 04/22/2014 08:08 AM   Modules accepted: Orders

## 2014-04-23 NOTE — Progress Notes (Signed)
Quick Note:  Please make copy of labs for patient visit. ______ 

## 2014-04-25 DIAGNOSIS — E119 Type 2 diabetes mellitus without complications: Secondary | ICD-10-CM | POA: Diagnosis not present

## 2014-05-02 ENCOUNTER — Encounter: Payer: Self-pay | Admitting: Cardiology

## 2014-05-02 ENCOUNTER — Ambulatory Visit (INDEPENDENT_AMBULATORY_CARE_PROVIDER_SITE_OTHER): Payer: Medicare Other | Admitting: Cardiology

## 2014-05-02 VITALS — BP 140/62 | HR 91 | Ht 67.0 in | Wt 178.4 lb

## 2014-05-02 DIAGNOSIS — E0865 Diabetes mellitus due to underlying condition with hyperglycemia: Secondary | ICD-10-CM

## 2014-05-02 DIAGNOSIS — I119 Hypertensive heart disease without heart failure: Secondary | ICD-10-CM

## 2014-05-02 DIAGNOSIS — D500 Iron deficiency anemia secondary to blood loss (chronic): Secondary | ICD-10-CM

## 2014-05-02 DIAGNOSIS — E7800 Pure hypercholesterolemia, unspecified: Secondary | ICD-10-CM

## 2014-05-02 DIAGNOSIS — R42 Dizziness and giddiness: Secondary | ICD-10-CM

## 2014-05-02 MED ORDER — MECLIZINE HCL 25 MG PO TABS: 25.0000 mg | ORAL_TABLET | Freq: Four times a day (QID) | ORAL | Status: DC | PRN

## 2014-05-02 NOTE — Patient Instructions (Addendum)
START MECLIZINE 25 MG EVERY 6 HOURS AS NEEDED   Your physician wants you to follow-up in: 4 months with fasting labs (lp/bmet/hfp/cbc/a1c) and ekg   You will receive a reminder letter in the mail two months in advance. If you don't receive a letter, please call our office to schedule the follow-up appointment.

## 2014-05-02 NOTE — Progress Notes (Signed)
Cardiology Office Note   Date:  05/02/2014   ID:  [name], DOB December 08, 1945, MRN [id number]  PCP:  Daryl Coco, MD  Cardiologist:   Daryl Coco, MD   No chief complaint on file.     History of Present Illness: Daryl Webb is a 69 y.o. male who presents for follow-up office visit  This pleasant 69 year old gentleman is seen for a followup office visit. He has a history of essential hypertension, hypercholesterolemia, and diabetes. He also has a past history of prostate cancer and has a urethral stricture. He has had osteoarthritis and has had a previous right total knee replacement. He has had problems with exogenous obesity. At a previous visit he was found to have iron deficiency anemia and a history of melena and was subsequently endoscoped and was found to have a peptic ulcer. He has had a good response to omeprazole. At his last office visit he was experiencing severe back pain.  he has had successful back surgery by Dr. Sherwood Webb. He is not having any significant back pain now.  He is on a new medication for his diabetes and his diabetic control is markedly improved.  It is a once a week injection called Bydureon.  His last A1c had decreased significantly. Patient has been feeling well.  No chest pain or shortness of breath.  He just returned from 2 months in [place].  They have a RV.  In August the plan to go out to the [facility] The patient has been having occasional episodes of dizzy spells consistent with vertigo brought on by motion.  We will give him a prescription for meclizine  Past Medical History  Diagnosis Date  . Hypertension   . Diabetes mellitus   . Hypercholesterolemia   . History of prostate cancer   . History of urethral stricture   . H/O: osteoarthritis   . Constipation due to pain medication     needs stool softner while on pain medication  . Arthritis   . Anemia   . Ulcer duodenal hemorrhage     Past  Surgical History  Procedure Laterality Date  . Vasectomy  1985  . Carpal tunnel release      right hand,wrist Dr. Daylene Webb  . Arthroplasty  2004    right knee Dr. Wynelle Webb  . Prostatectomy      radical  . Joint replacement Right   . Knee arthroscopy Right   . Cystoscopy N/A 09/20/2012    Procedure: CYSTOSCOPY;  Surgeon: Daryl Gray, MD;  Location: [facility];  Service: Urology;  Laterality: N/A;     Current Outpatient Prescriptions  Medication Sig Dispense Refill  . amLODipine-atorvastatin (CADUET) 5-40 MG per tablet TAKE 1 TABLET DAILY 90 tablet 1  . aspirin EC 81 MG tablet Take 81 mg by mouth daily.    Marland Kitchen BYDUREON 2 MG PEN   5  . Cholecalciferol 2000 UNITS CAPS Take 1 capsule by mouth daily. VITAMIN D 3    . Coenzyme Q10 (COQ10) 100 MG CAPS Take 100 mg by mouth daily.    . diclofenac (VOLTAREN) 75 MG EC tablet Take 75 mg by mouth daily.    Marland Kitchen loratadine (CLARITIN) 10 MG tablet Take 5 mg by mouth daily. As needed    . losartan-hydrochlorothiazide (HYZAAR) 50-12.5 MG per tablet TAKE 1 TABLET DAILY 90 tablet 1  . meclizine (ANTIVERT) 25 MG tablet Take 1 tablet (25 mg total) by mouth every 6 (six) hours as needed for  dizziness. 30 tablet 5  . metFORMIN (GLUCOPHAGE) 1000 MG tablet Take 1 tablet (1,000 mg total) by mouth 2 (two) times daily with a meal. 180 tablet 3  . metoprolol succinate (TOPROL-XL) 50 MG 24 hr tablet TAKE 1 TABLET DAILY. TAKE WITH OR IMMEDIATELY FOLLOWING A MEAL 90 tablet 1  . NON FORMULARY Pt is back on his voltren he is taking 1 tab daily    . Omega-3 Fatty Acids (FISH OIL) 1000 MG CAPS Take 1,000 mg by mouth daily.    Marland Kitchen omeprazole (PRILOSEC) 40 MG capsule TAKE 1 CAPSULE (40 MG TOTAL) BY MOUTH EVERY OTHER DAY. 45 capsule 1  . OVER THE COUNTER MEDICATION Take by mouth daily. Energizing iron by enzymatic therapy (heme iron with vitamin b12) 1 softgel daily    . OVER THE COUNTER MEDICATION Take 1 lozenge by mouth daily. 50 mg vitamin b6, 400 mcg vitamin Z32, 992 mcg folic  acid     No current facility-administered medications for this visit.    Allergies:   Review of patient's allergies indicates no known allergies.    Social History:  The patient  reports that he quit smoking about 34 years ago. His smoking use included Cigarettes. He has a 30 pack-year smoking history. He has never used smokeless tobacco. He reports that he does not drink alcohol or use illicit drugs.   Family History:  The patient's family history includes Diabetes in his father; Heart disease in his father and mother; Heart failure in his mother.    ROS:  Please see the history of present illness.   Otherwise, review of systems are positive for none.   All other systems are reviewed and negative.    PHYSICAL EXAM: VS:  BP 140/62 mmHg  Pulse 91  Ht 5\' 7"  (1.702 m)  Wt 178 lb 6.4 oz (80.922 kg)  BMI 27.93 kg/m2 , BMI Body mass index is 27.93 kg/(m^2). GEN: Well nourished, well developed, in no acute distress HEENT: normal Neck: no JVD, carotid bruits, or masses Cardiac: RRR; there is a grade 2/6 systolic murmur of aortic stenosis. Respiratory:  clear to auscultation bilaterally, normal work of breathing GI: soft, nontender, nondistended, + BS MS: no deformity or atrophy Skin: warm and dry, no rash Neuro:  Strength and sensation are intact Psych: euthymic mood, full affect   EKG:  EKG is not ordered today.    Recent Labs: 04/22/2014: ALT 30; BUN 18; Creatinine 1.26; Hemoglobin 13.6; Platelets 264.0; Potassium 3.9; Sodium 139    Lipid Panel    Component Value Date/Time   CHOL 111 04/22/2014 0808   TRIG 160.0* 04/22/2014 0808   HDL 26.80* 04/22/2014 0808   CHOLHDL 4 04/22/2014 0808   VLDL 32.0 04/22/2014 0808   LDLCALC 52 04/22/2014 0808   LDLDIRECT 73.8 11/20/2012 0840      Wt Readings from Last 3 Encounters:  05/02/14 178 lb 6.4 oz (80.922 kg)  01/01/14 183 lb (83.008 kg)  08/16/13 184 lb (83.462 kg)        ASSESSMENT AND PLAN: : 1. essential  hypertension without heart failure 2. Hypercholesterolemia 3. adult-onset diabetes mellitus type 2, poorly controlled with recent A1c of 9.8 Now seeing Dr. Buddy Webb. 4. past history of prostate cancer. 5. history of urethral stricture 6. past history of peptic ulcer with melena and iron deficiency anemia 7. low back pain resolved with surgery 8.  Vertigo  Plan: Continue current medication.  Trial of meclizine 25 mg every 6 hours when necessary.  Recheck in  4 months for office visit EKG lipid panel hepatic function panel basal metabolic panel hemoglobin A1c and a CBC  Current medicines are reviewed at length with the patient today.  The patient  concerns regarding medicines.  The following changes have been made:  no change  Labs/ tests ordered today include:   Orders Placed This Encounter  Procedures  . Lipid panel  . Hepatic function panel  . Basic metabolic panel  . Hemoglobin A1c  . CBC with Differential/Platelet        Signed, Daryl Coco, MD  05/02/2014 6:51 PM    [facility] [address], [place], [place]  [address] Phone: [phone]; Fax: [phone]

## 2014-05-16 ENCOUNTER — Other Ambulatory Visit: Payer: Self-pay | Admitting: Dermatology

## 2014-05-16 DIAGNOSIS — Z85828 Personal history of other malignant neoplasm of skin: Secondary | ICD-10-CM | POA: Diagnosis not present

## 2014-05-16 DIAGNOSIS — L239 Allergic contact dermatitis, unspecified cause: Secondary | ICD-10-CM | POA: Diagnosis not present

## 2014-05-16 DIAGNOSIS — L820 Inflamed seborrheic keratosis: Secondary | ICD-10-CM | POA: Diagnosis not present

## 2014-05-16 DIAGNOSIS — L237 Allergic contact dermatitis due to plants, except food: Secondary | ICD-10-CM | POA: Diagnosis not present

## 2014-05-20 DIAGNOSIS — E119 Type 2 diabetes mellitus without complications: Secondary | ICD-10-CM | POA: Diagnosis not present

## 2014-05-20 DIAGNOSIS — H2513 Age-related nuclear cataract, bilateral: Secondary | ICD-10-CM | POA: Diagnosis not present

## 2014-05-22 ENCOUNTER — Other Ambulatory Visit: Payer: Self-pay | Admitting: Cardiology

## 2014-05-23 NOTE — Telephone Encounter (Signed)
The patient sees Dr. Buddy Duty for his diabetes.  We should let Dr. Buddy Duty order his diabetic medicine now

## 2014-05-28 ENCOUNTER — Encounter: Payer: Self-pay | Admitting: *Deleted

## 2014-05-28 ENCOUNTER — Encounter: Payer: Medicare Other | Attending: Internal Medicine | Admitting: *Deleted

## 2014-05-28 VITALS — Wt 178.1 lb

## 2014-05-28 DIAGNOSIS — Z713 Dietary counseling and surveillance: Secondary | ICD-10-CM | POA: Diagnosis not present

## 2014-05-28 DIAGNOSIS — E119 Type 2 diabetes mellitus without complications: Secondary | ICD-10-CM | POA: Diagnosis not present

## 2014-05-28 NOTE — Patient Instructions (Signed)
Breakfast: whole fruit & some type of protein         Dannon light & Fit Greek yogurt or comperable         Egg /meat/cheese with fruit Lunch: any kind sandwich     Cafeteria: balance....protein, vegetable,  Carbohydrate (potatoe, rice) BALANCE  Take Fasting glucose first thing in the morning before any food After meal glucose test 2 hours after any meal  ALWAYS HAVE CARBS AND PROTEINS TOGETHER!!!!  Take a walk  After your meal

## 2014-05-28 NOTE — Progress Notes (Signed)
Diabetes Self-Management Education  Visit Type:   DSME follow up  Appt. Start Time: 1000 Appt. End Time: 1100  05/28/2014  [name], identified by name and date of birth, is a 69 y.o. male with a diagnosis of Diabetes: Type 2.  Other people present during visit:  Spouse/SO . Daryl Webb and his wife travel a great deal in their Recreational Vehicle (RV). They were recently gone for multiple weeks and found that neither of them were sticking to their meal plans. Daryl Webb also goes to the [employer] frequently and thinks nothing of having both stewed apples and bread pudding. We discussed plans for modifying this behavior. He wife was present and supportive of our plan.  ASSESSMENT  Weight 178 lb 1.6 oz (80.786 kg). Body mass index is 27.89 kg/(m^2).   Subsequent Visit Information:  Since your last visit, have you continued or began the use of a meal plan?: No Since your last visit, have you continued or began to exercise on a consistent basis?: Yes How many days per week are you exercising or participating in a physicial activity for more than 20 minutes?: 5 Since your last visit have you continued or begun to take your medications as prescribed?: Yes Since your last visit, are you checking your blood glucose at least once a day?: Yes  Psychosocial:   Patient Belief/Attitude about Diabetes: Motivated to manage diabetes Self-management support: Doctor's office, Family, CDE visits Other persons present: Spouse/SO Patient Concerns: Nutrition/Meal planning, Monitoring, Glycemic Control Preferred Learning Style: No preference indicated Learning Readiness: Ready  Complications:   Last HgB A1C per patient/outside source: 6.4 mg/dL How often do you check your blood sugar?: 1-2 times/day Fasting Blood glucose range (mg/dL): 70-129 Postprandial Blood glucose range (mg/dL): 130-179  Diet Intake:  Breakfast: Fruit, 2C coffee Lunch: sandwich Dinner: pork, stewed apples, bread  pudding  Exercise:  Exercise: Light (walking / raking leaves)  Individualized Plan for Diabetes Self-Management Training:   Learning Objective:  Patient will have a greater understanding of diabetes self-management. Patient education plan per assessed needs and concerns is to attend individual sessions    Education Topics Reviewed with Patient Today:   Role of diet in the treatment of diabetes and the relationship between the three main macronutrients and blood glucose level, Information on hints to eating out and maintain blood glucose control., Meal options for control of blood glucose level and chronic complications. Role of exercise on diabetes management, blood pressure control and cardiac health. Purpose and frequency of SMBG.  PATIENTS GOALS/Plan (Developed by the patient):  Nutrition: General guidelines for healthy choices and portions discussed Physical Activity: Exercise 5-7 days per week, 30 minutes per day  Patient Self Evaluation of Goals - Patient rates self as meeting previously set goals:   Nutrition: 50 - 75 % Physical Activity: 50 - 75 % Medications: >75% Monitoring: 50 - 75 % Health Coping: >75%  Patient Instructions  Breakfast: whole fruit & some type of protein         Dannon light & Fit Greek yogurt or comperable         Egg /meat/cheese with fruit Lunch: any kind sandwich     Cafeteria: balance....protein, vegetable,  Carbohydrate (potatoe, rice) BALANCE  Take Fasting glucose first thing in the morning before any food After meal glucose test 2 hours after any meal  ALWAYS HAVE CARBS AND PROTEINS TOGETHER!!!!  Take a walk  After your meal  Expected Outcomes:  Demonstrated interest in learning. Expect positive outcomes  If problems or questions, patient to contact team via:  Phone  Future DSME appointment: - PRN

## 2014-06-28 DIAGNOSIS — D225 Melanocytic nevi of trunk: Secondary | ICD-10-CM | POA: Diagnosis not present

## 2014-06-28 DIAGNOSIS — C44619 Basal cell carcinoma of skin of left upper limb, including shoulder: Secondary | ICD-10-CM | POA: Diagnosis not present

## 2014-06-28 DIAGNOSIS — L821 Other seborrheic keratosis: Secondary | ICD-10-CM | POA: Diagnosis not present

## 2014-06-28 DIAGNOSIS — D2262 Melanocytic nevi of left upper limb, including shoulder: Secondary | ICD-10-CM | POA: Diagnosis not present

## 2014-06-28 DIAGNOSIS — L814 Other melanin hyperpigmentation: Secondary | ICD-10-CM | POA: Diagnosis not present

## 2014-06-28 DIAGNOSIS — D1801 Hemangioma of skin and subcutaneous tissue: Secondary | ICD-10-CM | POA: Diagnosis not present

## 2014-06-28 DIAGNOSIS — D224 Melanocytic nevi of scalp and neck: Secondary | ICD-10-CM | POA: Diagnosis not present

## 2014-06-28 DIAGNOSIS — D2261 Melanocytic nevi of right upper limb, including shoulder: Secondary | ICD-10-CM | POA: Diagnosis not present

## 2014-06-28 DIAGNOSIS — Z85828 Personal history of other malignant neoplasm of skin: Secondary | ICD-10-CM | POA: Diagnosis not present

## 2014-08-15 ENCOUNTER — Other Ambulatory Visit: Payer: Self-pay | Admitting: Cardiology

## 2014-08-16 ENCOUNTER — Other Ambulatory Visit: Payer: Self-pay

## 2014-08-16 MED ORDER — METOPROLOL SUCCINATE ER 50 MG PO TB24: ORAL_TABLET | ORAL | Status: DC

## 2014-08-16 MED ORDER — LOSARTAN POTASSIUM-HCTZ 50-12.5 MG PO TABS: 1.0000 | ORAL_TABLET | Freq: Every day | ORAL | Status: DC

## 2014-08-16 MED ORDER — AMLODIPINE-ATORVASTATIN 5-40 MG PO TABS: 1.0000 | ORAL_TABLET | Freq: Every day | ORAL | Status: DC

## 2014-08-28 ENCOUNTER — Other Ambulatory Visit (INDEPENDENT_AMBULATORY_CARE_PROVIDER_SITE_OTHER): Payer: Medicare Other | Admitting: *Deleted

## 2014-08-28 DIAGNOSIS — E7800 Pure hypercholesterolemia, unspecified: Secondary | ICD-10-CM

## 2014-08-28 DIAGNOSIS — I119 Hypertensive heart disease without heart failure: Secondary | ICD-10-CM | POA: Diagnosis not present

## 2014-08-28 DIAGNOSIS — D500 Iron deficiency anemia secondary to blood loss (chronic): Secondary | ICD-10-CM | POA: Diagnosis not present

## 2014-08-28 DIAGNOSIS — E0865 Diabetes mellitus due to underlying condition with hyperglycemia: Secondary | ICD-10-CM

## 2014-08-28 LAB — CBC WITH DIFFERENTIAL/PLATELET
Basophils Absolute: 0 10*3/uL (ref 0.0–0.1)
Basophils Relative: 0.3 % (ref 0.0–3.0)
EOS PCT: 3.3 % (ref 0.0–5.0)
Eosinophils Absolute: 0.4 10*3/uL (ref 0.0–0.7)
HCT: 43 % (ref 39.0–52.0)
Hemoglobin: 14.4 g/dL (ref 13.0–17.0)
Lymphocytes Relative: 20.1 % (ref 12.0–46.0)
Lymphs Abs: 2.2 10*3/uL (ref 0.7–4.0)
MCHC: 33.6 g/dL (ref 30.0–36.0)
MCV: 97.8 fl (ref 78.0–100.0)
Monocytes Absolute: 0.6 10*3/uL (ref 0.1–1.0)
Monocytes Relative: 5.2 % (ref 3.0–12.0)
NEUTROS PCT: 71.1 % (ref 43.0–77.0)
Neutro Abs: 7.8 10*3/uL — ABNORMAL HIGH (ref 1.4–7.7)
Platelets: 249 10*3/uL (ref 150.0–400.0)
RBC: 4.4 Mil/uL (ref 4.22–5.81)
RDW: 14.6 % (ref 11.5–15.5)
WBC: 11 10*3/uL — AB (ref 4.0–10.5)

## 2014-08-28 LAB — BASIC METABOLIC PANEL
BUN: 18 mg/dL (ref 6–23)
CALCIUM: 9.5 mg/dL (ref 8.4–10.5)
CHLORIDE: 107 meq/L (ref 96–112)
CO2: 26 meq/L (ref 19–32)
Creatinine, Ser: 1.22 mg/dL (ref 0.40–1.50)
GFR: 62.51 mL/min (ref >60.00)
GLUCOSE: 146 mg/dL — AB (ref 70–99)
Potassium: 4 mEq/L (ref 3.5–5.1)
SODIUM: 141 meq/L (ref 135–145)

## 2014-08-28 LAB — HEPATIC FUNCTION PANEL
ALK PHOS: 74 U/L (ref 39–117)
ALT: 18 U/L (ref 0–53)
AST: 17 U/L (ref 0–37)
Albumin: 4 g/dL (ref 3.5–5.2)
BILIRUBIN DIRECT: 0.1 mg/dL (ref 0.0–0.3)
BILIRUBIN TOTAL: 0.4 mg/dL (ref 0.2–1.2)
Total Protein: 6.7 g/dL (ref 6.0–8.3)

## 2014-08-28 LAB — HEMOGLOBIN A1C: Hgb A1c MFr Bld: 6.7 % — ABNORMAL HIGH (ref 4.6–6.5)

## 2014-08-29 ENCOUNTER — Other Ambulatory Visit (INDEPENDENT_AMBULATORY_CARE_PROVIDER_SITE_OTHER): Payer: Medicare Other

## 2014-08-29 DIAGNOSIS — I119 Hypertensive heart disease without heart failure: Secondary | ICD-10-CM

## 2014-08-29 DIAGNOSIS — E7800 Pure hypercholesterolemia, unspecified: Secondary | ICD-10-CM

## 2014-08-29 LAB — LIPID PANEL
Cholesterol: 106 mg/dL (ref 0–200)
HDL: 31.5 mg/dL — AB (ref >39.00)
LDL Cholesterol: 49 mg/dL (ref 0–99)
NonHDL: 74.5
Total CHOL/HDL Ratio: 3
Triglycerides: 126 mg/dL (ref 0.0–149.0)
VLDL: 25.2 mg/dL (ref 0.0–40.0)

## 2014-08-30 NOTE — Progress Notes (Signed)
Quick Note:  Please make copy of labs for patient visit. ______ 

## 2014-09-03 ENCOUNTER — Ambulatory Visit (INDEPENDENT_AMBULATORY_CARE_PROVIDER_SITE_OTHER): Payer: Medicare Other | Admitting: Cardiology

## 2014-09-03 ENCOUNTER — Encounter: Payer: Self-pay | Admitting: Cardiology

## 2014-09-03 VITALS — BP 130/82 | HR 87 | Ht 67.0 in | Wt 175.8 lb

## 2014-09-03 DIAGNOSIS — E7800 Pure hypercholesterolemia, unspecified: Secondary | ICD-10-CM

## 2014-09-03 DIAGNOSIS — R011 Cardiac murmur, unspecified: Secondary | ICD-10-CM | POA: Diagnosis not present

## 2014-09-03 DIAGNOSIS — I119 Hypertensive heart disease without heart failure: Secondary | ICD-10-CM | POA: Diagnosis not present

## 2014-09-03 MED ORDER — OMEPRAZOLE 40 MG PO CPDR: DELAYED_RELEASE_CAPSULE | ORAL | Status: DC

## 2014-09-03 NOTE — Patient Instructions (Signed)
Medication Instructions:  Your physician recommends that you continue on your current medications as directed. Please refer to the Current Medication list given to you today.  Labwork: NONE  Testing/Procedures: NONE  Follow-Up: Your physician recommends that you schedule a follow-up appointment in: 4 months with fasting labs (lp/bmet/hfp)      

## 2014-09-03 NOTE — Progress Notes (Signed)
Cardiology Office Note   Date:  09/03/2014   ID:  Daryl Webb, DOB 1945-09-30, MRN [id number]  PCP:  Warren Danes, MD  Cardiologist: Darlin Coco MD  No chief complaint on file.     History of Present Illness: Daryl Webb is a 69 y.o. male who presents for a four-month follow-up office visit. This pleasant 69 year old gentleman is seen for a followup office visit. He has a history of essential hypertension, hypercholesterolemia, and diabetes. He also has a past history of prostate cancer and has a urethral stricture. He has had osteoarthritis and has had a previous right total knee replacement. He has had problems with exogenous obesity. At a previous visit he was found to have iron deficiency anemia and a history of melena and was subsequently endoscoped and was found to have a peptic ulcer. He has had a good response to omeprazole. At his last office visit he was experiencing severe back pain. he has had successful back surgery by Dr. Sherwood Gambler. He is not having any significant back pain now.  He is on a new medication for his diabetes and his diabetic control is markedly improved. It is a once a week injection called Bydureon. His last A1c had decreased significantly. Patient has been feeling well. No chest pain or shortness of breath. They have a RV. Next week they plan to go out to the [facility]  They take their 35 year old dog and 2 cats with them.  They will be gaunt about a month and they travel in their motor home. Since last visit he has been feeling well.  He has had no chest pain or shortness of breath.  His weight is down 3 pounds.  We reviewed his labs which are excellent including his hemoglobin A1c which has fallen to 6.8.  He has been followed for his diabetes by Dr. Buddy Duty.  Past Medical History  Diagnosis Date  . Hypertension   . Diabetes mellitus   . Hypercholesterolemia   . History of prostate cancer   . History of  urethral stricture   . H/O: osteoarthritis   . Constipation due to pain medication     needs stool softner while on pain medication  . Arthritis   . Anemia   . Ulcer duodenal hemorrhage     Past Surgical History  Procedure Laterality Date  . Vasectomy  1985  . Carpal tunnel release      right hand,wrist [name]  . Arthroplasty  2004    right knee Dr. Wynelle Link  . Prostatectomy      radical  . Joint replacement Right   . Knee arthroscopy Right   . Cystoscopy N/A 09/20/2012    Procedure: CYSTOSCOPY;  Surgeon: [name], MD;  Location: [facility];  Service: Urology;  Laterality: N/A;     Current Outpatient Prescriptions  Medication Sig Dispense Refill  . amLODipine-atorvastatin (CADUET) 5-40 MG per tablet Take 1 tablet by mouth daily. 90 tablet 0  . aspirin 81 MG tablet Take 81 mg by mouth daily.    Marland Kitchen BYDUREON 2 MG PEN Inject 2 mg into the skin once a week.   5  . Cholecalciferol 2000 UNITS CAPS Take 1 capsule by mouth daily. VITAMIN D 3    . Coenzyme Q10 (COQ10) 100 MG CAPS Take 100 mg by mouth daily.    . diclofenac (VOLTAREN) 75 MG EC tablet Take 75 mg by mouth daily.    Marland Kitchen loratadine (CLARITIN) 10 MG tablet  Take 5 mg by mouth daily. As needed    . losartan-hydrochlorothiazide (HYZAAR) 50-12.5 MG per tablet Take 1 tablet by mouth daily. 90 tablet 0  . meclizine (ANTIVERT) 25 MG tablet Take 1 tablet (25 mg total) by mouth every 6 (six) hours as needed for dizziness. 30 tablet 5  . metFORMIN (GLUCOPHAGE) 1000 MG tablet Take 1 tablet (1,000 mg total) by mouth 2 (two) times daily with a meal. 180 tablet 3  . metoprolol succinate (TOPROL-XL) 50 MG 24 hr tablet TAKE 1 TABLET DAILY. TAKE WITH OR IMMEDIATELY FOLLOWING A MEAL 90 tablet 0  . NON FORMULARY Pt is back on his voltren he is taking 1 tab daily    . Omega-3 Fatty Acids (FISH OIL) 1000 MG CAPS Take 1,000 mg by mouth daily.    Marland Kitchen omeprazole (PRILOSEC) 40 MG capsule TAKE 1 CAPSULE (40 MG TOTAL) BY MOUTH EVERY OTHER DAY. 45  capsule 3  . OVER THE COUNTER MEDICATION Take by mouth daily. Energizing iron by enzymatic therapy (heme iron with vitamin b12) 1 softgel daily    . OVER THE COUNTER MEDICATION Take 1 lozenge by mouth daily. 50 mg vitamin b6, 400 mcg vitamin U93, 235 mcg folic acid     No current facility-administered medications for this visit.    Allergies:   Review of patient's allergies indicates no known allergies.    Social History:  The patient  reports that he quit smoking about 34 years ago. His smoking use included Cigarettes. He has a 30 pack-year smoking history. He has never used smokeless tobacco. He reports that he does not drink alcohol or use illicit drugs.   Family History:  The patient's family history includes Diabetes in his father; Heart disease in his father and mother; Heart failure in his mother.    ROS:  Please see the history of present illness.   Otherwise, review of systems are positive for none.   All other systems are reviewed and negative.    PHYSICAL EXAM: VS:  BP 130/82 mmHg  Pulse 87  Ht 5\' 7"  (1.702 m)  Wt 175 lb 12.8 oz (79.742 kg)  BMI 27.53 kg/m2 , BMI Body mass index is 27.53 kg/(m^2). GEN: Well nourished, well developed, in no acute distress HEENT: normal Neck: no JVD, carotid bruits, or masses Cardiac: RRR; there is a grade 2/6 systolic murmur of mild aortic stenosis. Respiratory:  clear to auscultation bilaterally, normal work of breathing GI: soft, nontender, nondistended, + BS MS: no deformity or atrophy Skin: warm and dry, no rash Neuro:  Strength and sensation are intact Psych: euthymic mood, full affect   EKG:  EKG is ordered today. The ekg ordered today demonstrates normal sinus rhythm with inferolateral T-wave abnormalities unchanged since 01/01/14   Recent Labs: 08/28/2014: ALT 18; BUN 18; Creatinine, Ser 1.22; Hemoglobin 14.4; Platelets 249.0; Potassium 4.0; Sodium 141    Lipid Panel    Component Value Date/Time   CHOL 106 08/29/2014  1622   TRIG 126.0 08/29/2014 1622   HDL 31.50* 08/29/2014 1622   CHOLHDL 3 08/29/2014 1622   VLDL 25.2 08/29/2014 1622   LDLCALC 49 08/29/2014 1622   LDLDIRECT 73.8 11/20/2012 0840      Wt Readings from Last 3 Encounters:  09/03/14 175 lb 12.8 oz (79.742 kg)  05/28/14 178 lb 1.6 oz (80.786 kg)  05/02/14 178 lb 6.4 oz (80.922 kg)        ASSESSMENT AND PLAN:  1. essential hypertension without heart failure 2. Hypercholesterolemia 3.  adult-onset diabetes mellitus type 2, improved on current therapy.  Now seeing Dr. Buddy Duty. 4. past history of prostate cancer. 5. history of urethral stricture 6. past history of peptic ulcer with melena and iron deficiency anemia 7. low back pain resolved with surgery 8. Mild aortic stenosis.  Last echocardiogram 05/01/13   Current medicines are reviewed at length with the patient today.  The patient does not have concerns regarding medicines.  The following changes have been made:  no change  Labs/ tests ordered today include:   Orders Placed This Encounter  Procedures  . Lipid panel  . Hepatic function panel  . Basic metabolic panel  . EKG 12-Lead   Disposition: Continue current medication.  Continue careful diet.  Recheck in 4 months for office visit lipid panel hepatic function panel and basal metabolic panel.  Berna Spare MD 09/03/2014 11:04 AM    [address], [place], [place]  [address] Phone: [phone]; Fax: [phone]

## 2014-10-31 ENCOUNTER — Telehealth: Payer: Self-pay | Admitting: Cardiology

## 2014-10-31 DIAGNOSIS — E119 Type 2 diabetes mellitus without complications: Secondary | ICD-10-CM

## 2014-10-31 NOTE — Telephone Encounter (Signed)
Spoke with patient and he will bring order by

## 2014-10-31 NOTE — Telephone Encounter (Signed)
New Message       Pt calling wanting to talk to Daryl Webb about upcoming lab work, stating that his endocrinologist Dr. Minette Brine wants him to have a couple of labs done while he is here in our office and the pt wants to talk to [place] about it. Please call back and advise.

## 2014-11-07 ENCOUNTER — Other Ambulatory Visit: Payer: Self-pay | Admitting: Cardiology

## 2014-11-18 NOTE — Telephone Encounter (Signed)
Received orders and put in system to be done for Dr Buddy Duty HgbA1c and Micro albumin panel

## 2014-12-20 ENCOUNTER — Encounter: Payer: Self-pay | Admitting: Cardiology

## 2014-12-20 DIAGNOSIS — Z23 Encounter for immunization: Secondary | ICD-10-CM | POA: Diagnosis not present

## 2014-12-27 DIAGNOSIS — C44519 Basal cell carcinoma of skin of other part of trunk: Secondary | ICD-10-CM | POA: Diagnosis not present

## 2014-12-27 DIAGNOSIS — D1801 Hemangioma of skin and subcutaneous tissue: Secondary | ICD-10-CM | POA: Diagnosis not present

## 2014-12-27 DIAGNOSIS — L720 Epidermal cyst: Secondary | ICD-10-CM | POA: Diagnosis not present

## 2014-12-27 DIAGNOSIS — C4441 Basal cell carcinoma of skin of scalp and neck: Secondary | ICD-10-CM | POA: Diagnosis not present

## 2014-12-27 DIAGNOSIS — L570 Actinic keratosis: Secondary | ICD-10-CM | POA: Diagnosis not present

## 2014-12-27 DIAGNOSIS — Z85828 Personal history of other malignant neoplasm of skin: Secondary | ICD-10-CM | POA: Diagnosis not present

## 2014-12-27 DIAGNOSIS — D225 Melanocytic nevi of trunk: Secondary | ICD-10-CM | POA: Diagnosis not present

## 2014-12-27 DIAGNOSIS — L821 Other seborrheic keratosis: Secondary | ICD-10-CM | POA: Diagnosis not present

## 2014-12-27 DIAGNOSIS — L814 Other melanin hyperpigmentation: Secondary | ICD-10-CM | POA: Diagnosis not present

## 2014-12-27 DIAGNOSIS — L237 Allergic contact dermatitis due to plants, except food: Secondary | ICD-10-CM | POA: Diagnosis not present

## 2015-01-06 ENCOUNTER — Other Ambulatory Visit (INDEPENDENT_AMBULATORY_CARE_PROVIDER_SITE_OTHER): Payer: Medicare Other | Admitting: *Deleted

## 2015-01-06 DIAGNOSIS — E7800 Pure hypercholesterolemia, unspecified: Secondary | ICD-10-CM

## 2015-01-06 DIAGNOSIS — E1165 Type 2 diabetes mellitus with hyperglycemia: Secondary | ICD-10-CM | POA: Diagnosis not present

## 2015-01-06 LAB — BASIC METABOLIC PANEL
BUN: 23 mg/dL (ref 7–25)
CALCIUM: 9.3 mg/dL (ref 8.6–10.3)
CHLORIDE: 103 mmol/L (ref 98–110)
CO2: 26 mmol/L (ref 20–31)
CREATININE: 1.12 mg/dL (ref 0.70–1.25)
GLUCOSE: 135 mg/dL — AB (ref 65–99)
Potassium: 3.6 mmol/L (ref 3.5–5.3)
Sodium: 140 mmol/L (ref 135–146)

## 2015-01-06 LAB — LIPID PANEL
Cholesterol: 139 mg/dL (ref 125–200)
HDL: 49 mg/dL (ref >=40)
LDL CALC: 61 mg/dL (ref <130)
Total CHOL/HDL Ratio: 2.8 Ratio (ref <=5.0)
Triglycerides: 144 mg/dL (ref <150)
VLDL: 29 mg/dL (ref <30)

## 2015-01-06 LAB — HEPATIC FUNCTION PANEL
ALBUMIN: 3.9 g/dL (ref 3.6–5.1)
ALK PHOS: 82 U/L (ref 40–115)
ALT: 26 U/L (ref 9–46)
AST: 17 U/L (ref 10–35)
BILIRUBIN DIRECT: 0.1 mg/dL (ref <=0.2)
BILIRUBIN INDIRECT: 0.3 mg/dL (ref 0.2–1.2)
BILIRUBIN TOTAL: 0.4 mg/dL (ref 0.2–1.2)
Total Protein: 6.6 g/dL (ref 6.1–8.1)

## 2015-01-06 LAB — MICROALBUMIN / CREATININE URINE RATIO
Creatinine, Urine: 215 mg/dL (ref 20–370)
MICROALB/CREAT RATIO: 392 ug/mg{creat} — AB (ref <30)
Microalb, Ur: 84.2 mg/dL

## 2015-01-06 LAB — HEMOGLOBIN A1C
Hgb A1c MFr Bld: 7.1 % — ABNORMAL HIGH (ref <5.7)
Mean Plasma Glucose: 157 mg/dL — ABNORMAL HIGH (ref <117)

## 2015-01-07 NOTE — Progress Notes (Signed)
Quick Note:  Please make copy of labs for patient visit. ______ 

## 2015-01-08 ENCOUNTER — Ambulatory Visit: Payer: Medicare Other | Admitting: Cardiology

## 2015-01-08 ENCOUNTER — Telehealth: Payer: Self-pay | Admitting: Cardiology

## 2015-01-08 NOTE — Telephone Encounter (Signed)
Left message to call back  

## 2015-01-08 NOTE — Telephone Encounter (Signed)
Patient had an appointment for 01-08-15 with Dr. Mare Ferrari which was canceled and rescheduled to 02-27-15.  Patient came in today stating that he was not informed.  He has some insurance/medication changes that he wants to discuss with you prior to December 7th that cannot wait until his appointment.  Please call patient.

## 2015-01-14 ENCOUNTER — Telehealth: Payer: Self-pay | Admitting: Cardiology

## 2015-01-14 NOTE — Telephone Encounter (Signed)
Spoke with patient and he would like to change Caduet to separate Rx's secondary to cost next year Needs all Rx's mailed to him (Prilosec, Metoprolol, Losartan/hctz, Amlodipine, and Atorvastatin), changing mail order pharmacy  Also mail copy of labs Will print for  Dr. Mare Ferrari to sign Monday when he is back in the office

## 2015-01-14 NOTE — Telephone Encounter (Signed)
Spoke with patient  Will mail Rx's for as requested

## 2015-01-14 NOTE — Telephone Encounter (Signed)
New message   Pt calling for Daryl Webb he is very upset that he cant reach her by phone and a message has to be sent to her  Someone told him that she is at another office, I confirmed and said yes I can send a staff message over to her at the other location.  Pt did not want to tell me in detail what he wanted he states " I just want her to pick up the phone; to return my call!"

## 2015-01-23 MED ORDER — AMLODIPINE BESYLATE 5 MG PO TABS: 5.0000 mg | ORAL_TABLET | Freq: Every day | ORAL | Status: DC

## 2015-01-23 MED ORDER — LOSARTAN POTASSIUM-HCTZ 50-12.5 MG PO TABS: 1.0000 | ORAL_TABLET | Freq: Every day | ORAL | Status: DC

## 2015-01-23 MED ORDER — ATORVASTATIN CALCIUM 40 MG PO TABS: 40.0000 mg | ORAL_TABLET | Freq: Every day | ORAL | Status: DC

## 2015-01-23 MED ORDER — METOPROLOL SUCCINATE ER 50 MG PO TB24: ORAL_TABLET | ORAL | Status: DC

## 2015-01-23 MED ORDER — OMEPRAZOLE 40 MG PO CPDR: DELAYED_RELEASE_CAPSULE | ORAL | Status: DC

## 2015-01-23 NOTE — Telephone Encounter (Signed)
Rx's printed to be mailed

## 2015-01-23 NOTE — Telephone Encounter (Signed)
F/u    Pt need to speak with you concerning previous message. Please call pt

## 2015-02-05 DIAGNOSIS — N390 Urinary tract infection, site not specified: Secondary | ICD-10-CM | POA: Diagnosis not present

## 2015-02-05 DIAGNOSIS — N99112 Postprocedural membranous urethral stricture: Secondary | ICD-10-CM | POA: Diagnosis not present

## 2015-02-05 DIAGNOSIS — Z8546 Personal history of malignant neoplasm of prostate: Secondary | ICD-10-CM | POA: Diagnosis not present

## 2015-02-27 ENCOUNTER — Ambulatory Visit (INDEPENDENT_AMBULATORY_CARE_PROVIDER_SITE_OTHER): Payer: Medicare Other | Admitting: Cardiology

## 2015-02-27 ENCOUNTER — Encounter: Payer: Self-pay | Admitting: Cardiology

## 2015-02-27 VITALS — BP 140/74 | HR 80 | Ht 67.0 in | Wt 183.8 lb

## 2015-02-27 DIAGNOSIS — E7800 Pure hypercholesterolemia, unspecified: Secondary | ICD-10-CM

## 2015-02-27 DIAGNOSIS — E119 Type 2 diabetes mellitus without complications: Secondary | ICD-10-CM | POA: Diagnosis not present

## 2015-02-27 DIAGNOSIS — I119 Hypertensive heart disease without heart failure: Secondary | ICD-10-CM

## 2015-02-27 NOTE — Patient Instructions (Addendum)
Medication Instructions:  Your physician recommends that you continue on your current medications as directed. Please refer to the Current Medication list given to you today.  Labwork: none  Testing/Procedures: none  Follow-Up: Your physician wants you to follow-up in: 4 month ov/ekg with Dr Debara Pickett at the [facility] office    Any Other Special Instructions Will Be Listed Below (If Applicable). Work harder on diet and weight loss   If you need a refill on your cardiac medications before your next appointment, please call your pharmacy.

## 2015-02-27 NOTE — Progress Notes (Signed)
Cardiology Office Note   Date:  02/27/2015   ID:  Daryl Webb, DOB [birth year], MRN [id number]  PCP:  Warren Danes, MD  Cardiologist: Darlin Coco MD  Chief Complaint  Patient presents with  . routine follow up    Patient denies chest pain, shortness of breath, le edema, or claudication       History of Present Illness: Daryl Webb is a 70 y.o. male who presents for scheduled four-month follow-up visit  This pleasant 70 year old gentleman is seen for a followup office visit. He has a history of essential hypertension, hypercholesterolemia, and diabetes. He also has a past history of prostate cancer and has a urethral stricture. He has had osteoarthritis and has had a previous right total knee replacement. He has had problems with exogenous obesity. At a previous visit he was found to have iron deficiency anemia and a history of melena and was subsequently endoscoped and was found to have a peptic ulcer. He has had a good response to omeprazole. At his last office visit he was experiencing severe back pain. he has had successful back surgery by Dr. Sherwood Gambler. He is not having any significant back pain now.  He is on a new medication for his diabetes and his diabetic control is markedly improved.his diabetes is followed by Dr. Buddy Duty. Since last visit the patient has not been as physically active.  His weight is up 8 pounds.  He is not having any chest pain or increased shortness of breath or palpitations. He has a known systolic murmur.  His last echocardiogram on 05/01/13 showed mild aortic stenosis.  Past Medical History  Diagnosis Date  . Hypertension   . Diabetes mellitus   . Hypercholesterolemia   . History of prostate cancer   . History of urethral stricture   . H/O: osteoarthritis   . Constipation due to pain medication     needs stool softner while on pain medication  . Arthritis   . Anemia   . Ulcer duodenal hemorrhage     Past Surgical  History  Procedure Laterality Date  . Vasectomy  1985  . Carpal tunnel release      right hand,wrist [name]  . Arthroplasty  2004    right knee Dr. Wynelle Link  . Prostatectomy      radical  . Joint replacement Right   . Knee arthroscopy Right   . Cystoscopy N/A 09/20/2012    Procedure: CYSTOSCOPY;  Surgeon: [name], MD;  Location: [facility];  Service: Urology;  Laterality: N/A;     Current Outpatient Prescriptions  Medication Sig Dispense Refill  . amLODipine (NORVASC) 5 MG tablet Take 1 tablet (5 mg total) by mouth daily. 90 tablet 3  . aspirin 81 MG tablet Take 81 mg by mouth daily.    Marland Kitchen atorvastatin (LIPITOR) 40 MG tablet Take 1 tablet (40 mg total) by mouth daily. 90 tablet 3  . BYDUREON 2 MG PEN Inject 2 mg into the skin once a week.   5  . Cholecalciferol 2000 UNITS CAPS Take 1 capsule by mouth daily. VITAMIN D 3    . Coenzyme Q10 (COQ10) 100 MG CAPS Take 100 mg by mouth daily.    . diclofenac (VOLTAREN) 75 MG EC tablet Take 75 mg by mouth daily.    Marland Kitchen loratadine (CLARITIN) 10 MG tablet Take 5 mg by mouth daily. As needed for allergies    . losartan-hydrochlorothiazide (HYZAAR) 50-12.5 MG tablet Take 1 tablet by mouth daily.  90 tablet 3  . meclizine (ANTIVERT) 25 MG tablet Take 1 tablet (25 mg total) by mouth every 6 (six) hours as needed for dizziness. 30 tablet 5  . metFORMIN (GLUCOPHAGE) 1000 MG tablet Take 1 tablet (1,000 mg total) by mouth 2 (two) times daily with a meal. 180 tablet 3  . metoprolol succinate (TOPROL-XL) 50 MG 24 hr tablet Take 50 mg by mouth daily. Take with or immediately following a meal.    . NON FORMULARY Pt is back on his voltren he is taking 1 tab daily    . Omega-3 Fatty Acids (FISH OIL) 1000 MG CAPS Take 1,000 mg by mouth daily.    Marland Kitchen omeprazole (PRILOSEC) 40 MG capsule TAKE 1 CAPSULE (40 MG TOTAL) BY MOUTH EVERY OTHER DAY. 45 capsule 3  . OVER THE COUNTER MEDICATION Take by mouth daily. Energizing iron by enzymatic therapy (heme iron with  vitamin b12) 1 softgel daily    . OVER THE COUNTER MEDICATION Take 1 lozenge by mouth daily. 50 mg vitamin b6, 400 mcg vitamin [id number], [id number] mcg folic acid     No current facility-administered medications for this visit.    Allergies:   Review of patient's allergies indicates no known allergies.    Social History:  The patient  reports that he quit smoking about 35 years ago. His smoking use included Cigarettes. He has a 30 pack-year smoking history. He has never used smokeless tobacco. He reports that he does not drink alcohol or use illicit drugs.   Family History:  The patient's family history includes Diabetes in his father; Heart disease in his father and mother; Heart failure in his mother.    ROS:  Please see the history of present illness.   Otherwise, review of systems are positive for none.   All other systems are reviewed and negative.    PHYSICAL EXAM: VS:  BP 140/74 mmHg  Pulse 80  Ht 5\' 7"  (1.702 m)  Wt 183 lb 12.8 oz (83.371 kg)  BMI 28.78 kg/m2 , BMI Body mass index is 28.78 kg/(m^2). GEN: Well nourished, well developed, in no acute distress HEENT: normal Neck: no JVD, carotid bruits, or masses Cardiac: RRR; , rubs, or gallops,no edema.there is a grade 2/6 harsh systolic ejection murmur at the base radiating to the neck  Respiratory:  clear to auscultation bilaterally, normal work of breathing GI: soft, nontender, nondistended, + BS MS: no deformity or atrophy Skin: warm and dry, no rash Neuro:  Strength and sensation are intact Psych: euthymic mood, full affect   EKG:  EKG is not ordered today.   Recent Labs: 08/28/2014: Hemoglobin 14.4; Platelets 249.0 01/06/2015: ALT 26; BUN 23; Creat 1.12; Potassium 3.6; Sodium 140    Lipid Panel    Component Value Date/Time   CHOL 139 01/06/2015 0822   TRIG 144 01/06/2015 0822   HDL 49 01/06/2015 0822   CHOLHDL 2.8 01/06/2015 0822   VLDL 29 01/06/2015 0822   LDLCALC 61 01/06/2015 0822   LDLDIRECT 73.8 11/20/2012 0840       Wt Readings from Last 3 Encounters:  02/27/15 183 lb 12.8 oz (83.371 kg)  09/03/14 175 lb 12.8 oz (79.742 kg)  05/28/14 178 lb 1.6 oz (80.786 kg)         ASSESSMENT AND PLAN:  1. essential hypertension without heart failure 2. Hypercholesterolemia 3. adult-onset diabetes mellitus type 2, improved on current therapy. Now seeing Dr. Buddy Duty. 4. past history of prostate cancer. 5. history of urethral stricture 6. past  history of peptic ulcer with melena and iron deficiency anemia 7. low back pain resolved with surgery 8. Mild aortic stenosis. Last echocardiogram 05/01/13   Current medicines are reviewed at length with the patient today.  The patient does not have concerns regarding medicines.  The following changes have been made:  no change  Labs/ tests ordered today include:  No orders of the defined types were placed in this encounter.   Disposition.  Needs to exercise more and to lose weight.  Continue current medication.  Recheck in 4 months for office visit and EKG with [name].  He will also be getting a primary care physician  Signed, Darlin Coco MD 02/27/2015 12:47 PM    [facility] [address], [place], [place]  [address] Phone: [phone]; Fax: [phone]

## 2015-03-24 DIAGNOSIS — R300 Dysuria: Secondary | ICD-10-CM | POA: Diagnosis not present

## 2015-03-24 DIAGNOSIS — B961 Klebsiella pneumoniae [K. pneumoniae] as the cause of diseases classified elsewhere: Secondary | ICD-10-CM | POA: Diagnosis not present

## 2015-03-24 DIAGNOSIS — Z0000 Encounter for general adult medical examination without abnormal findings: Secondary | ICD-10-CM | POA: Diagnosis not present

## 2015-03-24 DIAGNOSIS — N390 Urinary tract infection, site not specified: Secondary | ICD-10-CM | POA: Diagnosis not present

## 2015-04-30 DIAGNOSIS — Z7984 Long term (current) use of oral hypoglycemic drugs: Secondary | ICD-10-CM | POA: Diagnosis not present

## 2015-04-30 DIAGNOSIS — E119 Type 2 diabetes mellitus without complications: Secondary | ICD-10-CM | POA: Diagnosis not present

## 2015-06-19 ENCOUNTER — Telehealth: Payer: Self-pay | Admitting: Internal Medicine

## 2015-06-19 DIAGNOSIS — E119 Type 2 diabetes mellitus without complications: Secondary | ICD-10-CM

## 2015-06-19 DIAGNOSIS — Z79899 Other long term (current) drug therapy: Secondary | ICD-10-CM | POA: Diagnosis not present

## 2015-06-19 DIAGNOSIS — E785 Hyperlipidemia, unspecified: Secondary | ICD-10-CM | POA: Diagnosis not present

## 2015-06-19 NOTE — Telephone Encounter (Signed)
Please advise on any pre visit labwork for this former [facility] patient.

## 2015-06-19 NOTE — Telephone Encounter (Signed)
Orders created for Daryl Webb, and informed pt of instructions to fast for test and report to [facility] for draw any day before appt next week.  Pt voiced thanks for the assistance and acknowledgement of instructions.

## 2015-06-19 NOTE — Telephone Encounter (Signed)
New Message  Former Dr Mare Ferrari pt wanted to touch base w/ Dr Debara Pickett RN before coming to appt on 5/*19- mainly to discuss any possible labs to be drawn- labsin system expired Nov/2016. Please call back and discuss.

## 2015-06-19 NOTE — Telephone Encounter (Signed)
Go ahead and order fasting lipid, CMET and HbA1c.  Dr. Lemmie Evens

## 2015-06-20 LAB — COMPREHENSIVE METABOLIC PANEL
ALBUMIN: 4 g/dL (ref 3.6–5.1)
ALT: 25 U/L (ref 9–46)
AST: 19 U/L (ref 10–35)
Alkaline Phosphatase: 77 U/L (ref 40–115)
BILIRUBIN TOTAL: 0.4 mg/dL (ref 0.2–1.2)
BUN: 21 mg/dL (ref 7–25)
CO2: 25 mmol/L (ref 20–31)
CREATININE: 1.4 mg/dL — AB (ref 0.70–1.18)
Calcium: 9.5 mg/dL (ref 8.6–10.3)
Chloride: 106 mmol/L (ref 98–110)
Glucose, Bld: 160 mg/dL — ABNORMAL HIGH (ref 65–99)
Potassium: 5 mmol/L (ref 3.5–5.3)
SODIUM: 143 mmol/L (ref 135–146)
TOTAL PROTEIN: 6.8 g/dL (ref 6.1–8.1)

## 2015-06-20 LAB — HEMOGLOBIN A1C
HEMOGLOBIN A1C: 6.7 % — AB (ref <5.7)
MEAN PLASMA GLUCOSE: 146 mg/dL

## 2015-06-20 LAB — LIPID PANEL
Cholesterol: 123 mg/dL — ABNORMAL LOW (ref 125–200)
HDL: 24 mg/dL — ABNORMAL LOW (ref >=40)
LDL Cholesterol: 70 mg/dL (ref <130)
Total CHOL/HDL Ratio: 5.1 Ratio — ABNORMAL HIGH (ref <=5.0)
Triglycerides: 147 mg/dL (ref <150)
VLDL: 29 mg/dL (ref <30)

## 2015-06-24 ENCOUNTER — Telehealth: Payer: Self-pay | Admitting: Internal Medicine

## 2015-06-24 DIAGNOSIS — Z0000 Encounter for general adult medical examination without abnormal findings: Secondary | ICD-10-CM | POA: Diagnosis not present

## 2015-06-24 DIAGNOSIS — R300 Dysuria: Secondary | ICD-10-CM | POA: Diagnosis not present

## 2015-06-24 DIAGNOSIS — N390 Urinary tract infection, site not specified: Secondary | ICD-10-CM | POA: Diagnosis not present

## 2015-06-24 DIAGNOSIS — N99112 Postprocedural membranous urethral stricture: Secondary | ICD-10-CM | POA: Diagnosis not present

## 2015-06-24 NOTE — Telephone Encounter (Signed)
Received records from [facility] for appointment on 06/27/15 with Dr Debara Pickett.  Records given to [facility] (medical records) for Dr Lysbeth Penner schedule on 06/27/15. lp

## 2015-06-27 ENCOUNTER — Encounter: Payer: Self-pay | Admitting: Internal Medicine

## 2015-06-27 ENCOUNTER — Ambulatory Visit (INDEPENDENT_AMBULATORY_CARE_PROVIDER_SITE_OTHER): Payer: Medicare Other | Admitting: Internal Medicine

## 2015-06-27 VITALS — BP 120/72 | HR 86 | Ht 67.0 in | Wt 178.0 lb

## 2015-06-27 DIAGNOSIS — L821 Other seborrheic keratosis: Secondary | ICD-10-CM | POA: Diagnosis not present

## 2015-06-27 DIAGNOSIS — E7800 Pure hypercholesterolemia, unspecified: Secondary | ICD-10-CM | POA: Diagnosis not present

## 2015-06-27 DIAGNOSIS — E119 Type 2 diabetes mellitus without complications: Secondary | ICD-10-CM

## 2015-06-27 DIAGNOSIS — Z85828 Personal history of other malignant neoplasm of skin: Secondary | ICD-10-CM | POA: Diagnosis not present

## 2015-06-27 DIAGNOSIS — L239 Allergic contact dermatitis, unspecified cause: Secondary | ICD-10-CM | POA: Diagnosis not present

## 2015-06-27 DIAGNOSIS — D225 Melanocytic nevi of trunk: Secondary | ICD-10-CM | POA: Diagnosis not present

## 2015-06-27 DIAGNOSIS — L570 Actinic keratosis: Secondary | ICD-10-CM | POA: Diagnosis not present

## 2015-06-27 DIAGNOSIS — R011 Cardiac murmur, unspecified: Secondary | ICD-10-CM | POA: Diagnosis not present

## 2015-06-27 DIAGNOSIS — I10 Essential (primary) hypertension: Secondary | ICD-10-CM | POA: Diagnosis not present

## 2015-06-27 DIAGNOSIS — L814 Other melanin hyperpigmentation: Secondary | ICD-10-CM | POA: Diagnosis not present

## 2015-06-27 DIAGNOSIS — D1801 Hemangioma of skin and subcutaneous tissue: Secondary | ICD-10-CM | POA: Diagnosis not present

## 2015-06-27 NOTE — Patient Instructions (Signed)
Medication Instructions:  Continue current medications  Labwork: NONE  Testing/Procedures: NONE  Follow-Up: Your physician wants you to follow-up in: 6 Months. You will receive a reminder letter in the mail two months in advance. If you don't receive a letter, please call our office to schedule the follow-up appointment.   Any Other Special Instructions Will Be Listed Below (If Applicable).   If you need a refill on your cardiac medications before your next appointment, please call your pharmacy.   

## 2015-06-27 NOTE — Progress Notes (Signed)
OFFICE NOTE  Chief Complaint:  Establish new cardiologist  Primary Care Physician: Daryl Danes, MD  HPI:  Daryl Webb is a 70 y.o. male followed by Dr. Mare Webb in the past for hypertension, dyslipidemia, type 2 diabetes and mild aortic sclerosis. His last echocardiogram was in 2015 which showed normal LV function and mild aortic sclerosis/borderline stenosis. Recent lab work was performed by myself indicating hemoglobin A1c of 6.7, creatinine 1.4 however he had a recent UTI, and cholesterol profile which was favorable with LDL 70 and total cholesterol 123. He is on atorvastatin 40 mg daily. He has no history of known coronary artery disease.  PMHx:  Past Medical History  Diagnosis Date  . Hypertension   . Diabetes mellitus   . Hypercholesterolemia   . History of prostate cancer   . History of urethral stricture   . H/O: osteoarthritis   . Constipation due to pain medication     needs stool softner while on pain medication  . Arthritis   . Anemia   . Ulcer duodenal hemorrhage     Past Surgical History  Procedure Laterality Date  . Vasectomy  1985  . Carpal tunnel release      right hand,wrist Dr. Daylene Webb  . Arthroplasty  2004    right knee Dr. Wynelle Webb  . Prostatectomy      radical  . Joint replacement Right   . Knee arthroscopy Right   . Cystoscopy N/A 09/20/2012    Procedure: CYSTOSCOPY;  Surgeon: Daryl Gray, MD;  Location: [facility];  Service: Urology;  Laterality: N/A;    FAMHx:  Family History  Problem Relation Age of Onset  . Heart disease Mother   . Heart failure Mother   . Heart disease Father   . Diabetes Father     SOCHx:   reports that he quit smoking about 35 years ago. His smoking use included Cigarettes. He has a 30 pack-year smoking history. He has never used smokeless tobacco. He reports that he does not drink alcohol or use illicit drugs.  ALLERGIES:  No Known Allergies  ROS: Pertinent items noted in HPI and remainder of  comprehensive ROS otherwise negative.  HOME MEDS: Current Outpatient Prescriptions  Medication Sig Dispense Refill  . amLODipine (NORVASC) 5 MG tablet Take 1 tablet (5 mg total) by mouth daily. 90 tablet 3  . aspirin 81 MG tablet Take 81 mg by mouth daily.    Marland Kitchen atorvastatin (LIPITOR) 40 MG tablet Take 1 tablet (40 mg total) by mouth daily. 90 tablet 3  . BYDUREON 2 MG PEN Inject 2 mg into the skin once a week.   5  . Cholecalciferol 2000 UNITS CAPS Take 1 capsule by mouth daily. VITAMIN D 3    . Coenzyme Q10 (COQ10) 100 MG CAPS Take 100 mg by mouth daily.    . diclofenac (VOLTAREN) 75 MG EC tablet Take 75 mg by mouth daily.    Marland Kitchen loratadine (CLARITIN) 10 MG tablet Take 5 mg by mouth daily. As needed for allergies    . losartan-hydrochlorothiazide (HYZAAR) 50-12.5 MG tablet Take 1 tablet by mouth daily. 90 tablet 3  . meclizine (ANTIVERT) 25 MG tablet Take 1 tablet (25 mg total) by mouth every 6 (six) hours as needed for dizziness. 30 tablet 5  . metFORMIN (GLUCOPHAGE) 1000 MG tablet Take 1 tablet (1,000 mg total) by mouth 2 (two) times daily with a meal. 180 tablet 3  . metoprolol succinate (TOPROL-XL) 50 MG 24 hr tablet  Take 50 mg by mouth daily. Take with or immediately following a meal.    . nitrofurantoin, macrocrystal-monohydrate, (MACROBID) 100 MG capsule Take 1 capsule by mouth 2 (two) times daily.    . NON FORMULARY Pt is back on his voltren he is taking 1 tab daily    . Omega-3 Fatty Acids (FISH OIL) 1000 MG CAPS Take 1,000 mg by mouth daily.    Marland Kitchen omeprazole (PRILOSEC) 40 MG capsule TAKE 1 CAPSULE (40 MG TOTAL) BY MOUTH EVERY OTHER DAY. 45 capsule 3  . OVER THE COUNTER MEDICATION Take by mouth daily. Energizing iron by enzymatic therapy (heme iron with vitamin b12) 1 softgel daily    . OVER THE COUNTER MEDICATION Take 1 lozenge by mouth daily. 50 mg vitamin b6, 400 mcg vitamin [id number], [id number] mcg folic acid     No current facility-administered medications for this visit.     LABS/IMAGING: No results found for this or any previous visit (from the past 48 hour(s)). No results found.  WEIGHTS: Wt Readings from Last 3 Encounters:  06/27/15 178 lb (80.74 kg)  02/27/15 183 lb 12.8 oz (83.371 kg)  09/03/14 175 lb 12.8 oz (79.742 kg)    VITALS: BP 120/72 mmHg  Pulse 86  Ht 5\' 7"  (1.702 m)  Wt 178 lb (80.74 kg)  BMI 27.87 kg/m2  EXAM: General appearance: alert and no distress Neck: no carotid bruit, no JVD and thyroid not enlarged, symmetric, no tenderness/mass/nodules Lungs: clear to auscultation bilaterally Heart: regular rate and rhythm, S1, S2 normal and systolic murmur: early systolic 2/6, crescendo at 2nd right intercostal space Abdomen: soft, non-tender; bowel sounds normal; no masses,  no organomegaly Extremities: extremities normal, atraumatic, no cyanosis or edema Pulses: 2+ and symmetric Skin: Skin color, texture, turgor normal. No rashes or lesions Neurologic: Grossly normal Psych: Pleasant  EKG: Normal sinus rhythm at 86, lateral T-wave abnormalities seen previously  ASSESSMENT: 1. Hypertension-at goal 2. Dyslipidemia-at goal 3. Diabetes type 2-A1c 6.7, followed by Dr. Buddy Webb 4. Mild aortic sclerosis and borderline stenosis  PLAN: 1.   [name] is doing well. He has good control of her blood pressure, cholesterol and diabetes. He had some mild aortic sclerosis and borderline stenosis by echo in 2015. His aortic murmur is very mild today and I do not believe he needs repeat echo at this time. We'll continue to follow him clinically. Fortunately he is fairly active after having right total knee replacement and low back surgery. Plan to see him back in 6 months or sooner as necessary.  Daryl Casino, MD, [facility] Attending Cardiologist [place] 06/27/2015, 8:29 AM

## 2015-06-27 NOTE — Addendum Note (Signed)
Addended by: Vennie Homans on: 06/27/2015 08:44 AM   Modules accepted: Orders

## 2015-07-08 DIAGNOSIS — Z8546 Personal history of malignant neoplasm of prostate: Secondary | ICD-10-CM | POA: Diagnosis not present

## 2015-07-08 DIAGNOSIS — R300 Dysuria: Secondary | ICD-10-CM | POA: Diagnosis not present

## 2015-07-08 DIAGNOSIS — Z0000 Encounter for general adult medical examination without abnormal findings: Secondary | ICD-10-CM | POA: Diagnosis not present

## 2015-07-08 DIAGNOSIS — N99112 Postprocedural membranous urethral stricture: Secondary | ICD-10-CM | POA: Diagnosis not present

## 2015-07-31 ENCOUNTER — Other Ambulatory Visit: Payer: Self-pay | Admitting: *Deleted

## 2015-07-31 MED ORDER — DICLOFENAC SODIUM 75 MG PO TBEC: 75.0000 mg | DELAYED_RELEASE_TABLET | Freq: Every day | ORAL | Status: DC

## 2015-07-31 NOTE — Telephone Encounter (Signed)
Pt called in, currently in process of getting set up w PCP (had suggestions from [name] at last visit) I filled diclofenac for him in the meantime, as Dr. Maureen Ralphs would not fill. Advised to call for any further needs or questions. Pt was extremely thankful for the assistance.

## 2015-08-05 ENCOUNTER — Telehealth: Payer: Self-pay | Admitting: Internal Medicine

## 2015-08-05 NOTE — Telephone Encounter (Signed)
Ok with me 

## 2015-08-05 NOTE — Telephone Encounter (Signed)
Pt called request to be Dr.John new pt, he said [name] refer him. Pt has medicare and [employer].

## 2015-08-08 DIAGNOSIS — N99112 Postprocedural membranous urethral stricture: Secondary | ICD-10-CM | POA: Diagnosis not present

## 2015-08-08 DIAGNOSIS — N3020 Other chronic cystitis without hematuria: Secondary | ICD-10-CM | POA: Diagnosis not present

## 2015-08-08 DIAGNOSIS — N390 Urinary tract infection, site not specified: Secondary | ICD-10-CM | POA: Diagnosis not present

## 2015-08-08 DIAGNOSIS — R300 Dysuria: Secondary | ICD-10-CM | POA: Diagnosis not present

## 2015-08-13 ENCOUNTER — Ambulatory Visit (INDEPENDENT_AMBULATORY_CARE_PROVIDER_SITE_OTHER): Payer: Medicare Other | Admitting: Internal Medicine

## 2015-08-13 ENCOUNTER — Encounter: Payer: Self-pay | Admitting: Internal Medicine

## 2015-08-13 VITALS — BP 118/70 | HR 80 | Temp 98.5°F | Resp 20 | Wt 181.0 lb

## 2015-08-13 DIAGNOSIS — K264 Chronic or unspecified duodenal ulcer with hemorrhage: Secondary | ICD-10-CM | POA: Insufficient documentation

## 2015-08-13 DIAGNOSIS — K269 Duodenal ulcer, unspecified as acute or chronic, without hemorrhage or perforation: Secondary | ICD-10-CM

## 2015-08-13 DIAGNOSIS — Z0001 Encounter for general adult medical examination with abnormal findings: Secondary | ICD-10-CM

## 2015-08-13 DIAGNOSIS — Z87448 Personal history of other diseases of urinary system: Secondary | ICD-10-CM

## 2015-08-13 DIAGNOSIS — I119 Hypertensive heart disease without heart failure: Secondary | ICD-10-CM | POA: Diagnosis not present

## 2015-08-13 DIAGNOSIS — R6889 Other general symptoms and signs: Secondary | ICD-10-CM | POA: Diagnosis not present

## 2015-08-13 DIAGNOSIS — E7800 Pure hypercholesterolemia, unspecified: Secondary | ICD-10-CM

## 2015-08-13 DIAGNOSIS — Z1159 Encounter for screening for other viral diseases: Secondary | ICD-10-CM

## 2015-08-13 HISTORY — DX: Duodenal ulcer, unspecified as acute or chronic, without hemorrhage or perforation: K26.9

## 2015-08-13 HISTORY — DX: Chronic or unspecified duodenal ulcer with hemorrhage: K26.4

## 2015-08-13 MED ORDER — DICLOFENAC SODIUM 75 MG PO TBEC: 75.0000 mg | DELAYED_RELEASE_TABLET | Freq: Every day | ORAL | Status: DC

## 2015-08-13 NOTE — Progress Notes (Signed)
Pre visit review using our clinic review tool, if applicable. No additional management support is needed unless otherwise documented below in the visit note. 

## 2015-08-13 NOTE — Patient Instructions (Addendum)
Please check with your [facility] about which pneumonia shot you have had  Please continue all other medications as before, and refills have been done if requested - the diclofenac  Please have the pharmacy call with any other refills you may need.  Please continue your efforts at being more active, low cholesterol diet, and weight control.  You are otherwise up to date with prevention measures today.  Please keep your appointments with your specialists as you may have planned  Please return in 1 year for your yearly visit, or sooner if needed

## 2015-08-13 NOTE — Progress Notes (Signed)
Subjective:    Patient ID: Daryl Webb, male    DOB: 1945-02-21, 70 y.o.   MRN: [id number]  HPI  Here for yearly f/u;  Overall doing ok;  Pt denies Chest pain, worsening SOB, DOE, wheezing, orthopnea, PND, worsening LE edema, palpitations, dizziness or syncope.  Pt denies neurological change such as new headache, facial or extremity weakness.   Pt states overall good compliance with treatment and medications, good tolerability, and has been trying to follow appropriate diet.  Pt denies worsening depressive symptoms, suicidal ideation or panic. No fever, night sweats, wt loss, loss of appetite, or other constitutional symptoms.  Pt states good ability with ADL's, has low fall risk, home safety reviewed and adequate, no other significant changes in hearing or vision, and only occasionally active with exercise.   Pt denies polydipsia, polyuria, or low sugar symptoms such as weakness or confusion improved with po intake.     Has seen Dr [name] for DM for last few yr, as well as Dr Debara Pickett for cardiology (q 6 mo), and Dr Dahlstedt/urology. Also c/o dysuria but none today, has hx of radiation prostatitis and told he had scar tissue involving bladder neck s/p laser tx x 2, has been seen by NP urology with most recent cx neg, taking azo and cranberry, feels some better, urine flow better, and does self cath daily.   Pt denies polydipsia, polyuria, or low sugar symptoms such as weakness or confusion improved with po intake.  Pt states overall good compliance with meds, trying to follow lower cholesterol, diabetic diet, wt overall stable but little exercise however.    Past Medical History  Diagnosis Date  . Hypertension   . Diabetes mellitus   . Hypercholesterolemia   . History of prostate cancer   . History of urethral stricture   . H/O: osteoarthritis   . Constipation due to pain medication     needs stool softner while on pain medication  . Arthritis   . Anemia   . Ulcer duodenal  hemorrhage   . Duodenal ulcer 08/13/2015    2014  - Dr Kaplan/GI   Past Surgical History  Procedure Laterality Date  . Vasectomy  1985  . Carpal tunnel release      right hand,wrist [name]  . Arthroplasty  2004    right knee Dr. Wynelle Link  . Prostatectomy      radical  . Joint replacement Right   . Knee arthroscopy Right   . Cystoscopy N/A 09/20/2012    Procedure: CYSTOSCOPY;  Surgeon: [name], MD;  Location: [facility];  Service: Urology;  Laterality: N/A;    reports that he quit smoking about 35 years ago. His smoking use included Cigarettes. He has a 30 pack-year smoking history. He has never used smokeless tobacco. He reports that he does not drink alcohol or use illicit drugs. family history includes Diabetes in his father; Heart disease in his father and mother; Heart failure in his mother. No Known Allergies Current Outpatient Prescriptions on File Prior to Visit  Medication Sig Dispense Refill  . amLODipine (NORVASC) 5 MG tablet Take 1 tablet (5 mg total) by mouth daily. 90 tablet 3  . aspirin 81 MG tablet Take 81 mg by mouth daily.    Marland Kitchen atorvastatin (LIPITOR) 40 MG tablet Take 1 tablet (40 mg total) by mouth daily. 90 tablet 3  . BYDUREON 2 MG PEN Inject 2 mg into the skin once a week.   5  .  Cholecalciferol 2000 UNITS CAPS Take 1 capsule by mouth daily. VITAMIN D 3    . Coenzyme Q10 (COQ10) 100 MG CAPS Take 100 mg by mouth daily.    Marland Kitchen loratadine (CLARITIN) 10 MG tablet Take 5 mg by mouth daily. As needed for allergies    . losartan-hydrochlorothiazide (HYZAAR) 50-12.5 MG tablet Take 1 tablet by mouth daily. 90 tablet 3  . meclizine (ANTIVERT) 25 MG tablet Take 1 tablet (25 mg total) by mouth every 6 (six) hours as needed for dizziness. 30 tablet 5  . metFORMIN (GLUCOPHAGE) 1000 MG tablet Take 1 tablet (1,000 mg total) by mouth 2 (two) times daily with a meal. 180 tablet 3  . metoprolol succinate (TOPROL-XL) 50 MG 24 hr tablet Take 50 mg by mouth daily. Take with or  immediately following a meal.    . nitrofurantoin, macrocrystal-monohydrate, (MACROBID) 100 MG capsule Take 1 capsule by mouth 2 (two) times daily.    . NON FORMULARY Pt is back on his voltren he is taking 1 tab daily    . Omega-3 Fatty Acids (FISH OIL) 1000 MG CAPS Take 1,000 mg by mouth daily.    Marland Kitchen omeprazole (PRILOSEC) 40 MG capsule TAKE 1 CAPSULE (40 MG TOTAL) BY MOUTH EVERY OTHER DAY. 45 capsule 3  . OVER THE COUNTER MEDICATION Take by mouth daily. Energizing iron by enzymatic therapy (heme iron with vitamin b12) 1 softgel daily    . OVER THE COUNTER MEDICATION Take 1 lozenge by mouth daily. 50 mg vitamin b6, 400 mcg vitamin [id number], [id number] mcg folic acid     No current facility-administered medications on file prior to visit.   Review of Systems Constitutional: Negative for increased diaphoresis, or other activity, appetite or siginficant weight change other than noted HENT: Negative for worsening hearing loss, ear pain, facial swelling, mouth sores and neck stiffness.   Eyes: Negative for other worsening pain, redness or visual disturbance.  Respiratory: Negative for choking or stridor Cardiovascular: Negative for other chest pain and palpitations.  Gastrointestinal: Negative for worsening diarrhea, blood in stool, or abdominal distention Genitourinary: Negative for hematuria, flank pain or change in urine volume.  Musculoskeletal: Negative for myalgias or other joint complaints.  Skin: Negative for other color change and wound or drainage.  Neurological: Negative for syncope and numbness. other than noted Hematological: Negative for adenopathy. or other swelling Psychiatric/Behavioral: Negative for hallucinations, SI, self-injury, decreased concentration or other worsening agitation.      Objective:   Physical Exam BP 118/70 mmHg  Pulse 80  Temp(Src) 98.5 F (36.9 C) (Oral)  Resp 20  Wt 181 lb (82.101 kg)  SpO2 95% VS noted,  Constitutional: Pt is oriented to person, place, and  time. Appears well-developed and well-nourished, in no significant distress Head: Normocephalic and atraumatic  Eyes: Conjunctivae and EOM are normal. Pupils are equal, round, and reactive to light Right Ear: External ear normal.  Left Ear: External ear normal Nose: Nose normal.  Mouth/Throat: Oropharynx is clear and moist  Neck: Normal range of motion. Neck supple. No JVD present. No tracheal deviation present or significant neck LA or mass Cardiovascular: Normal rate, regular rhythm, normal heart sounds and intact distal pulses.   Pulmonary/Chest: Effort normal and breath sounds without rales or wheezing  Abdominal: Soft. Bowel sounds are normal. NT. No HSM  Musculoskeletal: Normal range of motion. Exhibits no edema Lymphadenopathy: Has no cervical adenopathy.  Neurological: Pt is alert and oriented to person, place, and time. Pt has normal reflexes. No cranial nerve  deficit. Motor grossly intact Skin: Skin is warm and dry. No rash noted or new ulcers Psychiatric:  Has normal mood and affect. Behavior is normal.      Assessment & Plan:

## 2015-08-18 DIAGNOSIS — Z0001 Encounter for general adult medical examination with abnormal findings: Secondary | ICD-10-CM | POA: Insufficient documentation

## 2015-08-18 NOTE — Assessment & Plan Note (Signed)
stable overall by history and exam, recent data reviewed with pt, and pt to continue medical treatment as before,  to f/u any worsening symptoms or concerns BP Readings from Last 3 Encounters:  08/13/15 118/70  06/27/15 120/72  02/27/15 140/74

## 2015-08-18 NOTE — Assessment & Plan Note (Signed)
Goal ldl < 100,  Lab Results  Component Value Date   LDLCALC 70 06/19/2015   stable overall by history and exam, recent data reviewed with pt, and pt to continue medical treatment as before,  to f/u any worsening symptoms or concerns

## 2015-08-18 NOTE — Assessment & Plan Note (Signed)

## 2015-08-18 NOTE — Assessment & Plan Note (Addendum)
With recent transient dysuria, cx neg, asympt now, no further antibx needed, f/u urology as planned  In addition to the time spent performing CPE, I spent an additional 10 minutes face to face,in which greater than 50% of this time was spent in counseling and coordination of care for patient's illness as documented.

## 2015-10-09 ENCOUNTER — Telehealth: Payer: Self-pay | Admitting: Internal Medicine

## 2015-10-09 NOTE — Telephone Encounter (Signed)
Patient states he was seen by Dr. Jenny Reichmann and charged for a well visit on 7/5.  Can you please look into this?

## 2015-10-10 DIAGNOSIS — N35012 Post-traumatic membranous urethral stricture: Secondary | ICD-10-CM | POA: Diagnosis not present

## 2015-10-10 DIAGNOSIS — C61 Malignant neoplasm of prostate: Secondary | ICD-10-CM | POA: Diagnosis not present

## 2015-10-10 DIAGNOSIS — R1030 Lower abdominal pain, unspecified: Secondary | ICD-10-CM | POA: Diagnosis not present

## 2015-10-28 DIAGNOSIS — Z7984 Long term (current) use of oral hypoglycemic drugs: Secondary | ICD-10-CM | POA: Diagnosis not present

## 2015-10-28 DIAGNOSIS — E119 Type 2 diabetes mellitus without complications: Secondary | ICD-10-CM | POA: Diagnosis not present

## 2015-10-29 NOTE — Telephone Encounter (Signed)
Scientist, physiological for review ( CPE vs AWV ).

## 2015-10-30 NOTE — Telephone Encounter (Signed)
Notified patient.

## 2015-10-30 NOTE — Telephone Encounter (Signed)
I have emailed billing to void the CPE code for this patient's dos 08/13/15 as this was an establish care visit, please advise patient. Pt has traditional Medicare, he should be scheduled for an AWV in the future as CPE's will not be covered.

## 2015-10-31 ENCOUNTER — Other Ambulatory Visit: Payer: Self-pay | Admitting: Internal Medicine

## 2015-11-06 ENCOUNTER — Telehealth: Payer: Self-pay | Admitting: *Deleted

## 2015-11-06 NOTE — Telephone Encounter (Signed)
Pt left msg on triage requesting call back concerning rx Diclofenac. Called pt back no answer x's 10 rings...Daryl Webb

## 2015-11-07 MED ORDER — DICLOFENAC SODIUM 75 MG PO TBEC: 75.0000 mg | DELAYED_RELEASE_TABLET | Freq: Every day | ORAL | 3 refills | Status: DC

## 2015-11-07 NOTE — Telephone Encounter (Signed)
Called pt back concerning msg from yesterday. Pt is needing rx sent to [facility] on his Diclofenac. Notified pt will send new rx to mail service. Sent electronically...Daryl Webb

## 2015-12-10 DIAGNOSIS — Z23 Encounter for immunization: Secondary | ICD-10-CM | POA: Diagnosis not present

## 2015-12-18 DIAGNOSIS — H5203 Hypermetropia, bilateral: Secondary | ICD-10-CM | POA: Diagnosis not present

## 2015-12-18 DIAGNOSIS — E119 Type 2 diabetes mellitus without complications: Secondary | ICD-10-CM | POA: Diagnosis not present

## 2015-12-18 DIAGNOSIS — H524 Presbyopia: Secondary | ICD-10-CM | POA: Diagnosis not present

## 2015-12-18 DIAGNOSIS — H2513 Age-related nuclear cataract, bilateral: Secondary | ICD-10-CM | POA: Diagnosis not present

## 2015-12-18 DIAGNOSIS — H52203 Unspecified astigmatism, bilateral: Secondary | ICD-10-CM | POA: Diagnosis not present

## 2015-12-31 DIAGNOSIS — D1801 Hemangioma of skin and subcutaneous tissue: Secondary | ICD-10-CM | POA: Diagnosis not present

## 2015-12-31 DIAGNOSIS — D485 Neoplasm of uncertain behavior of skin: Secondary | ICD-10-CM | POA: Diagnosis not present

## 2015-12-31 DIAGNOSIS — L821 Other seborrheic keratosis: Secondary | ICD-10-CM | POA: Diagnosis not present

## 2015-12-31 DIAGNOSIS — Z85828 Personal history of other malignant neoplasm of skin: Secondary | ICD-10-CM | POA: Diagnosis not present

## 2015-12-31 DIAGNOSIS — L929 Granulomatous disorder of the skin and subcutaneous tissue, unspecified: Secondary | ICD-10-CM | POA: Diagnosis not present

## 2015-12-31 DIAGNOSIS — L570 Actinic keratosis: Secondary | ICD-10-CM | POA: Diagnosis not present

## 2015-12-31 DIAGNOSIS — L720 Epidermal cyst: Secondary | ICD-10-CM | POA: Diagnosis not present

## 2015-12-31 DIAGNOSIS — D225 Melanocytic nevi of trunk: Secondary | ICD-10-CM | POA: Diagnosis not present

## 2016-01-09 ENCOUNTER — Telehealth: Payer: Self-pay | Admitting: Internal Medicine

## 2016-01-09 NOTE — Telephone Encounter (Signed)
Patient called requesting to transfer care. He feels that he needs to seek a more therapeutic relationship.   Dr [name],  Are you willing to accept him as a patient?  Dr. Jenny Reichmann, Are you ok with this transfer?

## 2016-01-09 NOTE — Telephone Encounter (Signed)
Ok with me 

## 2016-01-10 NOTE — Telephone Encounter (Signed)
Fine with me. Thanks, BJ 

## 2016-01-12 NOTE — Telephone Encounter (Signed)
Daryl Webb,  Please call this patient and schedule a new patient visit with Dr Betty [name].  ty

## 2016-01-13 NOTE — Telephone Encounter (Signed)
Daryl Webb, I usually accept 1 new medicare patient per week on Monday (unless it is a referral from a current patient or physician). I think he would be well served with Dr. [name]- she is great at chronic disease management as well.

## 2016-01-13 NOTE — Telephone Encounter (Signed)
Dr Yong Channel,  This patient saw your profile on .[email or web address], and has interest in your focus on chronic disease management. Are you ok with accepting this patient?

## 2016-01-19 ENCOUNTER — Ambulatory Visit (INDEPENDENT_AMBULATORY_CARE_PROVIDER_SITE_OTHER)
Admission: RE | Admit: 2016-01-19 | Discharge: 2016-01-19 | Disposition: A | Discharge status: Home / Self Care | Payer: Medicare Other | Source: Ambulatory Visit | Attending: Family Medicine | Admitting: Family Medicine

## 2016-01-19 ENCOUNTER — Ambulatory Visit (INDEPENDENT_AMBULATORY_CARE_PROVIDER_SITE_OTHER): Payer: Medicare Other | Admitting: Family Medicine

## 2016-01-19 ENCOUNTER — Encounter: Payer: Self-pay | Admitting: Family Medicine

## 2016-01-19 VITALS — BP 132/80 | HR 63 | Resp 12 | Ht 67.0 in | Wt 177.5 lb

## 2016-01-19 DIAGNOSIS — M545 Low back pain, unspecified: Secondary | ICD-10-CM

## 2016-01-19 DIAGNOSIS — K279 Peptic ulcer, site unspecified, unspecified as acute or chronic, without hemorrhage or perforation: Secondary | ICD-10-CM | POA: Diagnosis not present

## 2016-01-19 DIAGNOSIS — E785 Hyperlipidemia, unspecified: Secondary | ICD-10-CM | POA: Insufficient documentation

## 2016-01-19 DIAGNOSIS — R3129 Other microscopic hematuria: Secondary | ICD-10-CM

## 2016-01-19 DIAGNOSIS — R109 Unspecified abdominal pain: Secondary | ICD-10-CM

## 2016-01-19 DIAGNOSIS — I119 Hypertensive heart disease without heart failure: Secondary | ICD-10-CM

## 2016-01-19 DIAGNOSIS — E784 Other hyperlipidemia: Secondary | ICD-10-CM

## 2016-01-19 LAB — POC URINALSYSI DIPSTICK (AUTOMATED)
Bilirubin, UA: NEGATIVE
Blood, UA: POSITIVE
Glucose, UA: NEGATIVE
KETONES UA: NEGATIVE
Nitrite, UA: NEGATIVE
PROTEIN UA: POSITIVE
Urobilinogen, UA: 0.2
pH, UA: 5.5

## 2016-01-19 LAB — BASIC METABOLIC PANEL
BUN: 21 mg/dL (ref 6–23)
CALCIUM: 9.5 mg/dL (ref 8.4–10.5)
CO2: 28 meq/L (ref 19–32)
CREATININE: 1.39 mg/dL (ref 0.40–1.50)
Chloride: 106 mEq/L (ref 96–112)
GFR: 53.56 mL/min — ABNORMAL LOW (ref >60.00)
Glucose, Bld: 124 mg/dL — ABNORMAL HIGH (ref 70–99)
Potassium: 4.7 mEq/L (ref 3.5–5.1)
Sodium: 141 mEq/L (ref 135–145)

## 2016-01-19 LAB — URINALYSIS, MICROSCOPIC ONLY

## 2016-01-19 LAB — LIPID PANEL
CHOL/HDL RATIO: 4
Cholesterol: 113 mg/dL (ref 0–200)
HDL: 29.5 mg/dL — AB (ref >39.00)
LDL CALC: 58 mg/dL (ref 0–99)
NonHDL: 83.99
TRIGLYCERIDES: 130 mg/dL (ref 0.0–149.0)
VLDL: 26 mg/dL (ref 0.0–40.0)

## 2016-01-19 NOTE — Progress Notes (Signed)
HPI:   Chief Complaint  Patient presents with  . Daryl Webb is a 70 y.o. male, who is here today with his wife to establish care with me and complaining of persistent lower back pain  DM II following with Dr Buddy Duty at [facility], [place].  Former PCP: Dr Jeneen Rinks. Previously his PCP was Dr Mare Ferrari, cardiologists.   Back pain: C/O 6 months of intermittent left lower back pain, achy/sharp pain, radiated to left flank and sometimes to left groin. 6/10 in intensity, with no associated LE numbness, tingling, urinary incontinence or retention, stool incontinence, or saddle anesthesia.  Hx of spinal stenosis, s/p laminectomy about 3 years ago, according to pt, back pain resolved.  6 months ago left lower back radiated to left flank.  Hx of prostate cancer, he states that he followed with his urologists about 3 months ago, after examination it was felt like it was  "groing pull" He denies any injury or unusual level of activity.  Hx of generalized OA, he is on Diclofenac 75 mg EC, which he is taking as needed for pain.   Exacerbated by movement, bending; and by movement after prolonged rest/sitting. Alleviated by position changes. No rash or edema on area, fever, chills, or abnormal wt loss.  He has not noted dysuria, gross hematuria, or rectal pain. Hx of nephrolithiasis, years ago. Radical prostatectomy.   Wife and pt very concerned about this pain. Otherwise stable.   GERD/PUD:on PPI x 4 year, he was initially taking Omeprazole 40 mg daily and now q 2 days. Hx of intermittent diarrhea, getting better, attributes to PPI. Denies abdominal pain, nausea, vomiting, changes in bowel habits, blood in stool or melena.  He also would like labs done, he had labs last 06/2015, states that he would have been due for labs at this time.   Hyperlipidemia: Currently on Lipitor 40 mg daily. Following a low fat diet: Yes.  He has not noted side  effects with medication.   HTN: He is on Metoprolol Succinate 50 mg , Losartan-HCTZ 50-12.5 mg daily, and Amlodipine 5 mg daily. Denies severe/frequent headache, visual changes, chest pain, dyspnea, palpitation, claudication, focal weakness, or edema.  Hx of diastolic dysfunction grade I, echo 04/2913. 06/2015 Cr 1.4    Review of Systems  Constitutional: Negative for activity change, appetite change, fatigue, fever and unexpected weight change.  HENT: Negative for nosebleeds, sore throat and trouble swallowing.   Eyes: Negative for redness and visual disturbance.  Respiratory: Negative for cough, shortness of breath and wheezing.   Cardiovascular: Negative for chest pain, palpitations and leg swelling.  Gastrointestinal: Negative for abdominal pain, blood in stool, nausea and vomiting.       No changes in bowel habits.  Genitourinary: Negative for decreased urine volume, difficulty urinating, dysuria and hematuria.  Musculoskeletal: Positive for arthralgias and back pain. Negative for joint swelling.  Skin: Negative for rash.  Neurological: Negative for syncope, weakness and headaches.  Psychiatric/Behavioral: Negative for confusion. The patient is nervous/anxious.       Current Outpatient Prescriptions on File Prior to Visit  Medication Sig Dispense Refill  . amLODipine (NORVASC) 5 MG tablet Take 1 tablet (5 mg total) by mouth daily. 90 tablet 3  . aspirin 81 MG tablet Take 81 mg by mouth daily.    Marland Kitchen atorvastatin (LIPITOR) 40 MG tablet Take 1 tablet (40 mg total) by mouth daily. 90 tablet 3  . Cholecalciferol 2000 UNITS CAPS Take 1  capsule by mouth daily. VITAMIN D 3    . Coenzyme Q10 (COQ10) 100 MG CAPS Take 100 mg by mouth daily.    . diclofenac (VOLTAREN) 75 MG EC tablet Take 1 tablet (75 mg total) by mouth daily. 90 tablet 3  . loratadine (CLARITIN) 10 MG tablet Take 5 mg by mouth daily. As needed for allergies    . losartan-hydrochlorothiazide (HYZAAR) 50-12.5 MG tablet Take  1 tablet by mouth daily. 90 tablet 3  . metFORMIN (GLUCOPHAGE) 1000 MG tablet Take 1 tablet (1,000 mg total) by mouth 2 (two) times daily with a meal. 180 tablet 3  . metoprolol succinate (TOPROL-XL) 50 MG 24 hr tablet Take 50 mg by mouth daily. Take with or immediately following a meal.    . Omega-3 Fatty Acids (FISH OIL) 1000 MG CAPS Take 1,000 mg by mouth daily.    Marland Kitchen omeprazole (PRILOSEC) 40 MG capsule TAKE 1 CAPSULE (40 MG TOTAL) BY MOUTH EVERY OTHER DAY. 45 capsule 3   No current facility-administered medications on file prior to visit.      Past Medical History:  Diagnosis Date  . Anemia   . Arthritis   . Constipation due to pain medication    needs stool softner while on pain medication  . Diabetes mellitus   . Duodenal ulcer 08/13/2015   2014  - Dr Kaplan/GI  . H/O: osteoarthritis   . History of prostate cancer   . History of urethral stricture   . Hypercholesterolemia   . Hypertension   . Ulcer duodenal hemorrhage    No Known Allergies  Social History   Social History  . Marital status: Married    Spouse name: N/A  . Number of children: 2  . Years of education: N/A   Occupational History  . Retired    Social History Main Topics  . Smoking status: Former Smoker    Packs/day: 1.50    Years: 20.00    Types: Cigarettes    Quit date: 02/09/1980  . Smokeless tobacco: Never Used  . Alcohol use No  . Drug use: No  . Sexual activity: Not Asked   Other Topics Concern  . None   Social History Narrative  . None    Vitals:   01/19/16 0815  BP: 132/80  Pulse: 63  Resp: 12   O2 sat 96% at RA.  Body mass index is 27.8 kg/m.    Physical Exam  Nursing note and vitals reviewed. Constitutional: He is oriented to person, place, and time. He appears well-developed. No distress.  HENT:  Head: Atraumatic.  Mouth/Throat: Oropharynx is clear and moist and mucous membranes are normal.  Eyes: Conjunctivae and EOM are normal. Pupils are equal, round, and reactive  to light.  Neck: No tracheal deviation present. No thyroid mass and no thyromegaly present.  Cardiovascular: Normal rate and regular rhythm.   Murmur (RUSB-LUSB SEM II/VI) heard. Pulses:      Dorsalis pedis pulses are 2+ on the right side, and 2+ on the left side.  Respiratory: Effort normal and breath sounds normal. No respiratory distress.  GI: Soft. He exhibits no mass. There is no hepatomegaly. There is no tenderness. There is no CVA tenderness. Hernia confirmed negative in the right inguinal area and confirmed negative in the left inguinal area.  Genitourinary: Right testis shows no mass and no tenderness. Left testis shows no mass and no tenderness.  Genitourinary Comments: Tenderness upon palpation of left inguinal area, no masses or hernia appreciated.  Musculoskeletal: He exhibits edema (trace).  Pain upon palpation of lateral left-sided at the level of waist.  No tenderness upon palpation of paraspinal muscles, lumbar or thoracic , bilateral.  Lymphadenopathy:       Right: No inguinal adenopathy present.       Left: No inguinal adenopathy present.  Neurological: He is alert and oriented to person, place, and time. He has normal strength.  Skin: Skin is warm. No rash noted. No erythema.  Psychiatric: His mood appears anxious. Cognition and memory are normal.  Well groomed, good eye contact.      ASSESSMENT AND PLAN:     Matan was seen today for establish care.  Diagnoses and all orders for this visit:   Lab Results  Component Value Date   CREATININE 1.39 01/19/2016   BUN 21 01/19/2016   NA 141 01/19/2016   K 4.7 01/19/2016   CL 106 01/19/2016   CO2 28 01/19/2016   Lab Results  Component Value Date   CHOL 113 01/19/2016   HDL 29.50 (L) 01/19/2016   LDLCALC 58 01/19/2016   LDLDIRECT 73.8 11/20/2012   TRIG 130.0 01/19/2016   CHOLHDL 4 01/19/2016    Left flank pain  Possible causes discussed:musculoskeletal, , radicular pain, colon and renal  etiology. Reporting colonoscopy as current.  Because Hx of prostate cancer and microscopic hematuria abd/pelvic Ct will be arranged. Instrumented about warning signs.   -     POCT Urinalysis Dipstick (Automated) -     CT Abdomen Pelvis W Contrast; Future  Left-sided low back pain without sciatica, unspecified chronicity  Prior Hx of back pain. Because Hx of prostate cancer, lumbar imaging ordered. Local heat. Caution with Diclofenac.  -     DG Lumbar Spine Complete; Future   PUD (peptic ulcer disease)  Caution with NSAID's. No changes in PPI. Instructed about warning signs.   Benign hypertensive heart disease without heart failure  Otherwise adequately controlled. No changes in current management. DASH diet recommended. Eye exam recommended annually. F/U in 6 months, before if needed.  -     Basic metabolic panel -     Lipid panel  Microscopic hematuria  Urine dipstick + Blood. Urine sent for micro and Ucx. Caution with NSAID's. Adequate hydration, further recommendations will be given according to lab results.  -     Urinalysis, microscopic only -     Urine Culture -     CT Abdomen Pelvis W Contrast; Future  Dyslipidemia (high LDL; low HDL)  No changes in current management, will follow labs done today and will give further recommendations accordingly.    OV today 8:26-9:28 am, 44 min face to face, > 50% of visit dedicated to discussion of possible causes of left flank pain, side effects of medications, reviewing last labs in 06/2015.  Also discussion of plan of care.     [name]. [name], MD  [facility]. [place] office.

## 2016-01-19 NOTE — Progress Notes (Signed)
Pre visit review using our clinic review tool, if applicable. No additional management support is needed unless otherwise documented below in the visit note. 

## 2016-01-19 NOTE — Patient Instructions (Addendum)
A few things to remember from today's visit:   Left flank pain - Plan: POCT Urinalysis Dipstick (Automated)  Left-sided low back pain without sciatica, unspecified chronicity - Plan: DG Lumbar Spine Complete  Benign hypertensive heart disease without heart failure - Plan: Basic metabolic panel, Lipid panel  Microscopic hematuria - Plan: Urinalysis, microscopic only, Urine Culture  Dyslipidemia (high LDL; low HDL)   CT will be arranged.   We have ordered labs or studies at this visit.  It can take up to 1-2 weeks for results and processing. IF results require follow up or explanation, we will call you with instructions. Clinically stable results will be released to your [facility]. If you have not heard from [place] or cannot find your results in [facility] in 2 weeks please contact our office at [phone].  If you are not yet signed up for [facility], please consider signing up  Please be sure medication list is accurate. If a new problem present, please set up appointment sooner than planned today.

## 2016-01-20 ENCOUNTER — Telehealth: Payer: Self-pay | Admitting: Internal Medicine

## 2016-01-20 NOTE — Telephone Encounter (Signed)
lmtcb

## 2016-01-20 NOTE — Telephone Encounter (Signed)
The labs ordered by his PCP just yesterday are sufficient. I will review those with him in January.  Dr. Lemmie Evens

## 2016-01-20 NOTE — Telephone Encounter (Signed)
New message       Pt has an appt scheduled on 02-12-16.  He is a former Dr Mare Ferrari pt and said that he always got labs drawn prior to his appt with Dr Mare Ferrari. He want labs drawn prior to his jan appt.  Please call pt and let him know if he can have labs drawn.

## 2016-01-21 ENCOUNTER — Telehealth: Payer: Self-pay | Admitting: Internal Medicine

## 2016-01-21 NOTE — Telephone Encounter (Signed)
Daryl Webb is returning a call . Thanks

## 2016-01-21 NOTE — Telephone Encounter (Signed)
Spoke w patient regarding Jenna's msg - no need for labwork prior to visit. Recent labs drawn by PCP. Pt did want to verify this, also stated he wanted to be sure this wouldn't affect him being able to refill meds. I reviewed instructions w him. Asked if he needed refills prior to visit, he states no, that he will ask for refills when seen in office. I acknowledged, let patient know if he has further needs to call.

## 2016-01-21 NOTE — Telephone Encounter (Signed)
LM for patient that no labs are needed prior to Jan appt (per [facility])

## 2016-01-22 LAB — URINE CULTURE

## 2016-01-23 ENCOUNTER — Ambulatory Visit
Admission: RE | Admit: 2016-01-23 | Discharge: 2016-01-23 | Disposition: A | Discharge status: Home / Self Care | Payer: Medicare Other | Source: Ambulatory Visit | Attending: Family Medicine | Admitting: Family Medicine

## 2016-01-23 DIAGNOSIS — R3129 Other microscopic hematuria: Secondary | ICD-10-CM

## 2016-01-23 DIAGNOSIS — R109 Unspecified abdominal pain: Secondary | ICD-10-CM

## 2016-01-23 DIAGNOSIS — N3289 Other specified disorders of bladder: Secondary | ICD-10-CM | POA: Diagnosis not present

## 2016-01-23 MED ORDER — IOPAMIDOL (ISOVUE-300) INJECTION 61%
100.0000 mL | Freq: Once | INTRAVENOUS | Status: AC | PRN
  Administered 2016-01-23: 100 mL via INTRAVENOUS

## 2016-01-26 ENCOUNTER — Other Ambulatory Visit: Payer: Self-pay

## 2016-01-26 MED ORDER — SULFAMETHOXAZOLE-TRIMETHOPRIM 800-160 MG PO TABS: 1.0000 | ORAL_TABLET | Freq: Two times a day (BID) | ORAL | 0 refills | Status: DC

## 2016-02-12 ENCOUNTER — Encounter: Payer: Self-pay | Admitting: Internal Medicine

## 2016-02-12 ENCOUNTER — Ambulatory Visit (INDEPENDENT_AMBULATORY_CARE_PROVIDER_SITE_OTHER): Payer: Medicare Other | Admitting: Internal Medicine

## 2016-02-12 VITALS — BP 140/80 | HR 81 | Ht 67.0 in | Wt 182.0 lb

## 2016-02-12 DIAGNOSIS — I350 Nonrheumatic aortic (valve) stenosis: Secondary | ICD-10-CM | POA: Diagnosis not present

## 2016-02-12 DIAGNOSIS — I10 Essential (primary) hypertension: Secondary | ICD-10-CM

## 2016-02-12 DIAGNOSIS — E782 Mixed hyperlipidemia: Secondary | ICD-10-CM | POA: Insufficient documentation

## 2016-02-12 DIAGNOSIS — R109 Unspecified abdominal pain: Secondary | ICD-10-CM | POA: Diagnosis not present

## 2016-02-12 MED ORDER — ATORVASTATIN CALCIUM 40 MG PO TABS: 40.0000 mg | ORAL_TABLET | Freq: Every day | ORAL | 3 refills | Status: DC

## 2016-02-12 MED ORDER — AMLODIPINE BESYLATE 5 MG PO TABS: 5.0000 mg | ORAL_TABLET | Freq: Every day | ORAL | 3 refills | Status: DC

## 2016-02-12 MED ORDER — METOPROLOL SUCCINATE ER 50 MG PO TB24: 50.0000 mg | ORAL_TABLET | Freq: Every day | ORAL | 3 refills | Status: DC

## 2016-02-12 MED ORDER — OMEPRAZOLE 40 MG PO CPDR: DELAYED_RELEASE_CAPSULE | ORAL | 3 refills | Status: DC

## 2016-02-12 MED ORDER — LOSARTAN POTASSIUM-HCTZ 50-12.5 MG PO TABS: 1.0000 | ORAL_TABLET | Freq: Every day | ORAL | 3 refills | Status: DC

## 2016-02-12 NOTE — Progress Notes (Signed)
OFFICE NOTE  Chief Complaint:  Routine follow-up, left flank/groin pain  Primary Care Physician: Betty [name], MD  HPI:  Daryl Webb is a 71 y.o. male followed by Dr. Mare Ferrari in the past for hypertension, dyslipidemia, type 2 diabetes and mild aortic sclerosis. His last echocardiogram was in 2015 which showed normal LV function and mild aortic sclerosis/borderline stenosis. Recent lab work was performed by myself indicating hemoglobin A1c of 6.7, creatinine 1.4 however he had a recent UTI, and cholesterol profile which was favorable with LDL 70 and total cholesterol 123. He is on atorvastatin 40 mg daily. He has no history of known coronary artery disease.  02/12/2016  Daryl Webb returns today for follow-up. He recently established a new primary care provider with Dr. Betty [name]. She performs some lab work including cholesterol which appears to be well-controlled with total cholesterol 113, LDL 58, triglycerides 130 and HDL of 29, which is increased from 24 in May 2017. He denies any new symptoms of chest pain or worsening shortness of breath. He does have a early peaking systolic murmur suggestive of mild aortic stenosis. This was last seen on echo in 2015. He is also describing pain in the left flank which radiates into the left groin. He was treated for possible UTI/prostatitis and had improvement in some urinary symptoms but has persistent pain. A CT scan was performed recently, which was essentially unremarkable. He reports his pain is worse when rising from either sitting or lying position up to standing and with walking. This could suggest some compression perhaps of the thoracic or lumbar spine or even symptoms suggestive of a possible hernia.   PMHx:  Past Medical History:  Diagnosis Date  . Anemia   . Arthritis   . Constipation due to pain medication    needs stool softner while on pain medication  . Diabetes mellitus   . Duodenal ulcer 08/13/2015   2014  - Dr  Kaplan/GI  . H/O: osteoarthritis   . History of prostate cancer   . History of urethral stricture   . Hypercholesterolemia   . Hypertension   . Ulcer duodenal hemorrhage     Past Surgical History:  Procedure Laterality Date  . ARTHROPLASTY  2004   right knee Dr. Wynelle Link  . CARPAL TUNNEL RELEASE     right hand,wrist [name]  . CYSTOSCOPY N/A 09/20/2012   Procedure: CYSTOSCOPY;  Surgeon: [name], MD;  Location: [place] [facility];  Service: Urology;  Laterality: N/A;  . JOINT REPLACEMENT Right   . KNEE ARTHROSCOPY Right   . PROSTATECTOMY     radical  . VASECTOMY  1985    FAMHx:  Family History  Problem Relation Age of Onset  . Heart disease Mother   . Heart failure Mother   . Heart disease Father   . Diabetes Father     SOCHx:   reports that he quit smoking about 36 years ago. His smoking use included Cigarettes. He has a 30.00 pack-year smoking history. He has never used smokeless tobacco. He reports that he does not drink alcohol or use drugs.  ALLERGIES:  No Known Allergies  ROS: Pertinent items noted in HPI and remainder of comprehensive ROS otherwise negative.  HOME MEDS: Current Outpatient Prescriptions  Medication Sig Dispense Refill  . amLODipine (NORVASC) 5 MG tablet Take 1 tablet (5 mg total) by mouth daily. 90 tablet 3  . aspirin 81 MG tablet Take 81 mg by mouth daily.    Marland Kitchen atorvastatin (LIPITOR)  40 MG tablet Take 1 tablet (40 mg total) by mouth daily. 90 tablet 3  . Cholecalciferol 2000 UNITS CAPS Take 1 capsule by mouth daily. VITAMIN D 3    . Coenzyme Q10 (COQ10) 100 MG CAPS Take 100 mg by mouth daily.    . diclofenac (VOLTAREN) 75 MG EC tablet Take 1 tablet (75 mg total) by mouth daily. 90 tablet 3  . Exenatide ER (BYDUREON) 2 MG PEN Inject 1 Dose into the skin once a week.    . loratadine (CLARITIN) 10 MG tablet Take 5 mg by mouth daily. As needed for allergies    . losartan-hydrochlorothiazide (HYZAAR) 50-12.5 MG tablet Take 1 tablet by mouth daily.  90 tablet 3  . metFORMIN (GLUCOPHAGE) 1000 MG tablet Take 1 tablet (1,000 mg total) by mouth 2 (two) times daily with a meal. 180 tablet 3  . metoprolol succinate (TOPROL-XL) 50 MG 24 hr tablet Take 1 tablet (50 mg total) by mouth daily. Take with or immediately following a meal. 90 tablet 3  . Omega-3 Fatty Acids (FISH OIL) 1000 MG CAPS Take 1,000 mg by mouth daily.    Marland Kitchen omeprazole (PRILOSEC) 40 MG capsule TAKE 1 CAPSULE (40 MG TOTAL) BY MOUTH EVERY OTHER DAY. 45 capsule 3  . sulfamethoxazole-trimethoprim (BACTRIM DS) 800-160 MG tablet Take 1 tablet by mouth 2 (two) times daily. 14 tablet 0   No current facility-administered medications for this visit.     LABS/IMAGING: No results found for this or any previous visit (from the past 48 hour(s)). No results found.  WEIGHTS: Wt Readings from Last 3 Encounters:  02/12/16 182 lb (82.6 kg)  01/19/16 177 lb 8 oz (80.5 kg)  08/13/15 181 lb (82.1 kg)    VITALS: BP 140/80 (BP Location: Left Arm, Patient Position: Sitting, Cuff Size: Normal)   Pulse 81   Ht 5\' 7"  (1.702 m)   Wt 182 lb (82.6 kg)   BMI 28.51 kg/m   EXAM: General appearance: alert and no distress Neck: no carotid bruit, no JVD and thyroid not enlarged, symmetric, no tenderness/mass/nodules Lungs: clear to auscultation bilaterally Heart: regular rate and rhythm, S1, S2 normal and systolic murmur: early systolic 2/6, crescendo at 2nd right intercostal space Abdomen: soft, non-tender; bowel sounds normal; no masses,  no organomegaly and Diastases recti, no obvious abdominal hernial defect Extremities: extremities normal, atraumatic, no cyanosis or edema Pulses: 2+ and symmetric Skin: Skin color, texture, turgor normal. No rashes or lesions Neurologic: Grossly normal Psych: Pleasant  EKG: Normal sinus rhythm at 81, lateral T-wave abnormalities seen previously  ASSESSMENT: 1. Hypertension-at goal 2. Dyslipidemia-at goal 3. Diabetes type 2-A1c 6.7, followed by Dr.  Buddy Duty 4. Mild aortic sclerosis and borderline stenosis (2015)  PLAN: 1.   Daryl Webb is doing well. He has good control of her blood pressure, cholesterol and diabetes. He had some mild aortic sclerosis and borderline stenosis by echo in 2015. His aortic murmur is mild and early peaking but should be reassessed every 2-3 years. We'll repeat that echo soon. With regards to his left flank pain, some features may be consistent with a hernia however no defects were noted on CT scan. He also has a history of spinal stenosis with prior surgery. I suggested follow-up with his primary care provider for further evaluation since his symptoms have not totally resolved after recent treatment of UTI/prostatitis.  Follow-Up 6 months.  [name], MD, [facility] Attending Cardiologist [place] C Jorje Vanatta 02/12/2016, 9:15 AM

## 2016-02-12 NOTE — Patient Instructions (Signed)
Your physician has requested that you have an echocardiogram @ [address] - [address]. Echocardiography is a painless test that uses sound waves to create images of your heart. It provides your doctor with information about the size and shape of your heart and how well your heart's chambers and valves are working. This procedure takes approximately one hour. There are no restrictions for this procedure.   Your physician wants you to follow-up in: 6 months with Dr. Hilty. You will receive a reminder letter in the mail two months in advance. If you don't receive a letter, please call our office to schedule the follow-up appointment.  

## 2016-02-27 ENCOUNTER — Other Ambulatory Visit: Payer: Self-pay

## 2016-02-27 ENCOUNTER — Ambulatory Visit (HOSPITAL_COMMUNITY): Payer: Medicare Other | Attending: Cardiovascular Disease

## 2016-02-27 DIAGNOSIS — I350 Nonrheumatic aortic (valve) stenosis: Secondary | ICD-10-CM

## 2016-02-27 DIAGNOSIS — I052 Rheumatic mitral stenosis with insufficiency: Secondary | ICD-10-CM | POA: Insufficient documentation

## 2016-03-03 ENCOUNTER — Other Ambulatory Visit: Payer: Self-pay | Admitting: *Deleted

## 2016-03-03 DIAGNOSIS — R1030 Lower abdominal pain, unspecified: Secondary | ICD-10-CM | POA: Diagnosis not present

## 2016-03-03 DIAGNOSIS — I350 Nonrheumatic aortic (valve) stenosis: Secondary | ICD-10-CM

## 2016-03-03 DIAGNOSIS — N281 Cyst of kidney, acquired: Secondary | ICD-10-CM | POA: Diagnosis not present

## 2016-03-03 DIAGNOSIS — N35012 Post-traumatic membranous urethral stricture: Secondary | ICD-10-CM | POA: Diagnosis not present

## 2016-03-03 DIAGNOSIS — C61 Malignant neoplasm of prostate: Secondary | ICD-10-CM | POA: Diagnosis not present

## 2016-03-17 DIAGNOSIS — M6580 Other synovitis and tenosynovitis, unspecified site: Secondary | ICD-10-CM | POA: Diagnosis not present

## 2016-03-18 ENCOUNTER — Encounter: Payer: Self-pay | Admitting: Family Medicine

## 2016-03-18 ENCOUNTER — Ambulatory Visit (INDEPENDENT_AMBULATORY_CARE_PROVIDER_SITE_OTHER): Payer: Medicare Other | Admitting: Family Medicine

## 2016-03-18 VITALS — BP 140/84 | HR 97 | Resp 12 | Ht 67.0 in | Wt 180.5 lb

## 2016-03-18 DIAGNOSIS — R310 Gross hematuria: Secondary | ICD-10-CM | POA: Diagnosis not present

## 2016-03-18 DIAGNOSIS — R319 Hematuria, unspecified: Secondary | ICD-10-CM | POA: Diagnosis not present

## 2016-03-18 DIAGNOSIS — R399 Unspecified symptoms and signs involving the genitourinary system: Secondary | ICD-10-CM

## 2016-03-18 DIAGNOSIS — N390 Urinary tract infection, site not specified: Secondary | ICD-10-CM | POA: Diagnosis not present

## 2016-03-18 LAB — POCT URINALYSIS DIPSTICK
Bilirubin, UA: NEGATIVE
Blood, UA: POSITIVE
GLUCOSE UA: NEGATIVE
Ketones, UA: NEGATIVE
Nitrite, UA: NEGATIVE
PROTEIN UA: POSITIVE
UROBILINOGEN UA: 0.2
pH, UA: 6

## 2016-03-18 MED ORDER — SULFAMETHOXAZOLE-TRIMETHOPRIM 800-160 MG PO TABS: 1.0000 | ORAL_TABLET | Freq: Two times a day (BID) | ORAL | 0 refills | Status: AC

## 2016-03-18 MED ORDER — SULFAMETHOXAZOLE-TRIMETHOPRIM 800-160 MG PO TABS: 1.0000 | ORAL_TABLET | Freq: Two times a day (BID) | ORAL | 0 refills | Status: DC

## 2016-03-18 NOTE — Progress Notes (Signed)
HPI:   ACUTE VISIT:  Chief Complaint  Patient presents with  . possible uti    [name] is a 71 y.o. male, who is here today complaining of 1-2 weeks of urinary symptoms. Positive for dysuria, gross hematuria, and urinary frequency. Denies associated chills, fever,myalias, vomiting, or changes in bowel habits. Mild nausea yesterday. Hx of prostate cancer and urethral strictures ,he follows regularly with urologists. He has had gross hematuria in the past. Treated for UTI in 01/2016 and symptoms resolved with Bactrim. Ucx 01/19/16 Klebsiella pneumoniae >100,000 CFU.  He has had left flank pain for a few months now. Abdominal CT was negative for significant renal abnormalities or nephrolithiasis. He was referred to surgeon to evaluate for hernia and Dx with obturator muscle strain, he is waiting on starting PT.  01/23/16 abd/pelvic CT: Mild bladder wall thickening, which could be due to or prior radiation therapy, infection, or sequela of prior chronic outletobstruction.No acute intra-abdominal or intrapelvic abnormalities. Sclerotic lesion LEFT iliac bone unchanged since 2014.    He also would like to discussed recent visit with his cardiologists, Dr Daryl Webb. According to pt, he was told he could have labs here in our office or Dr [facility] office to help with cost. Last FLP was done here on 01/19/16 and I recommended continuing Atorvastatin 40 mg.   Lab Results  Component Value Date   CHOL 113 01/19/2016   HDL 29.50 (L) 01/19/2016   LDLCALC 58 01/19/2016   LDLDIRECT 73.8 11/20/2012   TRIG 130.0 01/19/2016   CHOLHDL 4 01/19/2016    Lab Results  Component Value Date   CREATININE 1.39 01/19/2016   BUN 21 01/19/2016   NA 141 01/19/2016   K 4.7 01/19/2016   CL 106 01/19/2016   CO2 28 01/19/2016     Review of Systems  Constitutional: Negative for activity change, appetite change, fatigue and fever.  HENT: Negative for mouth sores, nosebleeds and trouble  swallowing.   Respiratory: Negative for shortness of breath and wheezing.   Cardiovascular: Negative for leg swelling.  Gastrointestinal: Positive for nausea. Negative for abdominal pain, blood in stool, diarrhea and vomiting.  Genitourinary: Positive for dysuria, frequency and hematuria. Negative for decreased urine volume, discharge, scrotal swelling and testicular pain.  Musculoskeletal: Positive for back pain. Negative for arthralgias and joint swelling.  Skin: Negative for rash.  Neurological: Negative for syncope and weakness.  Hematological: Negative for adenopathy. Does not bruise/bleed easily.  Psychiatric/Behavioral: Negative for confusion. The patient is nervous/anxious.       Current Outpatient Prescriptions on File Prior to Visit  Medication Sig Dispense Refill  . amLODipine (NORVASC) 5 MG tablet Take 1 tablet (5 mg total) by mouth daily. 90 tablet 3  . aspirin 81 MG tablet Take 81 mg by mouth daily.    Marland Kitchen atorvastatin (LIPITOR) 40 MG tablet Take 1 tablet (40 mg total) by mouth daily. 90 tablet 3  . Cholecalciferol 2000 UNITS CAPS Take 1 capsule by mouth daily. VITAMIN D 3    . Coenzyme Q10 (COQ10) 100 MG CAPS Take 100 mg by mouth daily.    . diclofenac (VOLTAREN) 75 MG EC tablet Take 1 tablet (75 mg total) by mouth daily. 90 tablet 3  . Exenatide ER (BYDUREON) 2 MG PEN Inject 1 Dose into the skin once a week.    . loratadine (CLARITIN) 10 MG tablet Take 5 mg by mouth daily. As needed for allergies    . losartan-hydrochlorothiazide (HYZAAR) 50-12.5 MG tablet Take  1 tablet by mouth daily. 90 tablet 3  . metFORMIN (GLUCOPHAGE) 1000 MG tablet Take 1 tablet (1,000 mg total) by mouth 2 (two) times daily with a meal. 180 tablet 3  . metoprolol succinate (TOPROL-XL) 50 MG 24 hr tablet Take 1 tablet (50 mg total) by mouth daily. Take with or immediately following a meal. 90 tablet 3  . Omega-3 Fatty Acids (FISH OIL) 1000 MG CAPS Take 1,000 mg by mouth daily.    Marland Kitchen omeprazole (PRILOSEC)  40 MG capsule TAKE 1 CAPSULE (40 MG TOTAL) BY MOUTH EVERY OTHER DAY. 45 capsule 3   No current facility-administered medications on file prior to visit.      Past Medical History:  Diagnosis Date  . Anemia   . Arthritis   . Constipation due to pain medication    needs stool softner while on pain medication  . Diabetes mellitus   . Duodenal ulcer 08/13/2015   2014  - Dr Kaplan/GI  . H/O: osteoarthritis   . History of prostate cancer   . History of urethral stricture   . Hypercholesterolemia   . Hypertension   . Ulcer duodenal hemorrhage    No Known Allergies  Social History   Social History  . Marital status: Married    Spouse name: N/A  . Number of children: 2  . Years of education: N/A   Occupational History  . Retired    Social History Main Topics  . Smoking status: Former Smoker    Packs/day: 1.50    Years: 20.00    Types: Cigarettes    Quit date: 02/09/1980  . Smokeless tobacco: Never Used  . Alcohol use No  . Drug use: No  . Sexual activity: Not Asked   Other Topics Concern  . None   Social History Narrative  . None    Vitals:   03/18/16 0828  BP: 140/84  Pulse: 97  Resp: 12  O2 sat at RA 97%. Body mass index is 28.27 kg/m.   Physical Exam  Nursing note and vitals reviewed. Constitutional: He is oriented to person, place, and time. He appears well-developed. No distress.  HENT:  Head: Atraumatic.  Eyes: Conjunctivae and EOM are normal.  Cardiovascular: Normal rate and regular rhythm.   Murmur (SEM I-II/VI base.) heard. Respiratory: Effort normal and breath sounds normal. No respiratory distress.  GI: Soft. He exhibits no mass. There is no hepatomegaly. There is no tenderness. There is no CVA tenderness.  Musculoskeletal: He exhibits edema (trace pitting edema LE).  Neurological: He is alert and oriented to person, place, and time. He has normal strength. Coordination and gait normal.  Skin: Skin is warm. No rash noted. No erythema.    Psychiatric: His mood appears anxious.  Well groomed, good eye contact.      ASSESSMENT AND PLAN:     Daryl Webb was seen today for possible uti.  Diagnoses and all orders for this visit:  UTI symptoms -     POCT urinalysis dipstick  Urinary tract infection with hematuria, site unspecified  Possible causes of reported urinary symptoms discussed. Ucx ordered.   Abx treatment started today based on last Ucx.  Clearly instructed about warning signs. F/U if symptoms persist.  -     sulfamethoxazole-trimethoprim (BACTRIM DS,SEPTRA DS) 800-160 MG tablet; Take 1 tablet by mouth 2 (two) times daily.  Gross hematuria  Has had prior episodes intermittently. Continue following with urologists. Instructed about warning signs.     -Daryl Webb was advised to  return or notify a doctor immediately if symptoms worsen or persist or new concerns arise.       Daryl Checo G. [name], MD  [facility]. [place] office.

## 2016-03-18 NOTE — Patient Instructions (Signed)
A few things to remember from today's visit:   UTI symptoms - Plan: POCT urinalysis dipstick  Urinary tract infection with hematuria, site unspecified - Plan: sulfamethoxazole-trimethoprim (BACTRIM DS,SEPTRA DS) 800-160 MG tablet  Gross hematuria    Adequate fluid intake, avoid holding urine for long hours, and over the counter Vit C OR cranberry capsules might help.  Today urine was not sent for culture since you had one in 01/2016.  Seek immediate medical attention if severe abdominal pain, vomiting, fever/chills, or worsening symptoms. F/U if symptomatic are not any better after 2-3 days of antibiotic treatment.  If symptoms become recurrent we need to consider other possible causes, urology folow up may be necessary.   Please be sure medication list is accurate. If a new problem present, please set up appointment sooner than planned today.

## 2016-03-18 NOTE — Progress Notes (Signed)
Pre visit review using our clinic review tool, if applicable. No additional management support is needed unless otherwise documented below in the visit note. 

## 2016-03-29 DIAGNOSIS — M6580 Other synovitis and tenosynovitis, unspecified site: Secondary | ICD-10-CM | POA: Diagnosis not present

## 2016-04-12 DIAGNOSIS — M6580 Other synovitis and tenosynovitis, unspecified site: Secondary | ICD-10-CM | POA: Diagnosis not present

## 2016-04-14 ENCOUNTER — Other Ambulatory Visit: Payer: Self-pay

## 2016-04-14 MED ORDER — OMEPRAZOLE 40 MG PO CPDR: DELAYED_RELEASE_CAPSULE | ORAL | 3 refills | Status: DC

## 2016-05-06 DIAGNOSIS — Z5181 Encounter for therapeutic drug level monitoring: Secondary | ICD-10-CM | POA: Diagnosis not present

## 2016-05-06 DIAGNOSIS — E119 Type 2 diabetes mellitus without complications: Secondary | ICD-10-CM | POA: Diagnosis not present

## 2016-05-06 DIAGNOSIS — Z7984 Long term (current) use of oral hypoglycemic drugs: Secondary | ICD-10-CM | POA: Diagnosis not present

## 2016-06-30 DIAGNOSIS — Z85828 Personal history of other malignant neoplasm of skin: Secondary | ICD-10-CM | POA: Diagnosis not present

## 2016-06-30 DIAGNOSIS — C44619 Basal cell carcinoma of skin of left upper limb, including shoulder: Secondary | ICD-10-CM | POA: Diagnosis not present

## 2016-06-30 DIAGNOSIS — D225 Melanocytic nevi of trunk: Secondary | ICD-10-CM | POA: Diagnosis not present

## 2016-06-30 DIAGNOSIS — D1801 Hemangioma of skin and subcutaneous tissue: Secondary | ICD-10-CM | POA: Diagnosis not present

## 2016-06-30 DIAGNOSIS — L821 Other seborrheic keratosis: Secondary | ICD-10-CM | POA: Diagnosis not present

## 2016-06-30 DIAGNOSIS — L570 Actinic keratosis: Secondary | ICD-10-CM | POA: Diagnosis not present

## 2016-07-18 NOTE — Progress Notes (Signed)
HPI:   [name] is a 71 y.o. male, who is here today with his wife for 6 months follow up.   She was last seen on 03/18/16 for acute visit, UTI like symptoms. He follows with urologists for Hx of prostate cancer and Hx of gross hematuria.   Hypertension:  Currently on Hyzaar 50-12.5 mg daily and Metoprolol Succinate 50 mg daily.  BP at home occasionally. He is not consistent with low salt intake. He started walking regularly.   He has not noted unusual headache, visual changes, exertional chest pain, dyspnea,  focal weakness, or edema.   Lab Results  Component Value Date   CREATININE 1.39 01/19/2016   BUN 21 01/19/2016   NA 141 01/19/2016   K 4.7 01/19/2016   CL 106 01/19/2016   CO2 28 01/19/2016    Hx of aortic valve stenosis, he follows with cardiologists, Dr Debara Pickett.  Hyperlipidemia:  Currently on Lipitor 40 mg daily. Following a low fat diet: Yes.  He has not noted side effects with medication.   Lab Results  Component Value Date   CHOL 113 01/19/2016   HDL 29.50 (L) 01/19/2016   LDLCALC 58 01/19/2016   LDLDIRECT 73.8 11/20/2012   TRIG 130.0 01/19/2016   CHOLHDL 4 01/19/2016    Diabetes Mellitus II:   He follows with Dr Buddy Duty this year. Currently on Metformin 1000 mg bid and Bydureon weekly..   HgA1C < 7.0, not sure about date but it was this year. Last eye exam 02/2016.   New concerns today: He is concerned about some issues with follow ups, lab orders, left groin pain, and Medicare "changes" in lab/service coverage.  He is currently following with Dr Debara Pickett (cardiologists), Dr Buddy Duty (endo), urologists, and is established with ortho for chronic back pain and knee OA. He states that he is not sure who should be ordering labs in general and why he is not having them as often as he used to when he was following with Dr Mare Ferrari (his former cardiologist and PCP until he retired), q 3 months.  Also concerned about having a real  "physical", last wellness visit 08/13/15.  He is also concerned about persistent left inguinal and perineal pain. On 01/19/16 we dicussed this problems, he has also seen his urologists, evaluated by surgeon, and done PT.  Before PT pain was worse.Intermittent, radiated from left lower back to left flank, and left groin. Now pain on left lower back and flank are milder. Bothered mainly by groin, pressure/pulling sensation. He hold left genital area when showing me localization of pain, can not pinpoint it.  Exacerbated by certain activities like getting off his car and standing up after prolonged sitting, walking.He has not noted rash or skin changes.  Stable otherwise but not resolved. 5-6/10.  According to pt, surgeon Dx obturator inflammation and recommended PT.   He denies numbness,tingling, urine or bowel incontinent,or saddle anesthesia. No limitations in daily activities. He has Hx of gross hematuria, he has not had any in 4 months. Denies dysuria,increased urinary frequency,or decreased urine output.  He takes Diclofenac EC 75 mg daily as needed for generalized OA. Hx of spinal stenosis, s/p laminectomy , which helped greatly with back pain.     Abd/pelvic CT 01/23/16: Mild bladder wall thickening, which could be due to or prior radiation therapy, infection, or sequela of prior chronic outlet obstruction. No acute intra-abdominal or intrapelvic abnormalities. Aortic atherosclerosis. Sclerotic lesion LEFT iliac bone unchanged since 2014.  Lumbar X ray 01/19/16: 1. No fracture or acute finding. 2. No evidence of loosening of the orthopedic hardware. 3. Disc degenerative changes at L2-L3 which have increased when compared the prior study.  According to pt, he asked his urologists if an abdominal MRI would be useful but it was not deemed necessary at that time.   Lumbar MRI 08/2012: 1.  Progressive left lateral disc protrusion at L1-2 with now moderate left lateral recess  narrowing and left greater than right mild foraminal stenosis. 2.  Leftward disc protrusion at L2-3 is similar to the prior study. 3.  Moderate to severe central canal stenosis and moderate foraminal narrowing bilaterally at L3-4 is stable. 4.  Progressive lateral recess narrowing at L4-5, right greater than left. 5.  Mild right lateral recess and foraminal narrowing at L5-S1.    He is upset, annoyed when his wife tries to provide extra history.   Review of Systems  Constitutional: Negative for activity change, appetite change, fatigue, fever and unexpected weight change.  HENT: Negative for mouth sores, nosebleeds, sore throat and trouble swallowing.   Eyes: Negative for redness and visual disturbance.  Respiratory: Negative for cough, shortness of breath and wheezing.   Cardiovascular: Negative for chest pain, palpitations and leg swelling.  Gastrointestinal: Negative for abdominal pain, blood in stool, nausea and vomiting.  Endocrine: Negative for polydipsia, polyphagia and polyuria.  Genitourinary: Negative for decreased urine volume, dysuria, flank pain, hematuria, penile swelling, scrotal swelling and testicular pain.  Musculoskeletal: Positive for arthralgias and back pain. Negative for gait problem.  Skin: Negative for color change and rash.  Neurological: Negative for syncope, weakness, numbness and headaches.  Hematological: Negative for adenopathy. Does not bruise/bleed easily.  Psychiatric/Behavioral: Negative for confusion. The patient is nervous/anxious.      Current Outpatient Prescriptions on File Prior to Visit  Medication Sig Dispense Refill  . amLODipine (NORVASC) 5 MG tablet Take 1 tablet (5 mg total) by mouth daily. 90 tablet 3  . aspirin 81 MG tablet Take 81 mg by mouth daily.    Marland Kitchen atorvastatin (LIPITOR) 40 MG tablet Take 1 tablet (40 mg total) by mouth daily. 90 tablet 3  . Cholecalciferol 2000 UNITS CAPS Take 1 capsule by mouth daily. VITAMIN D 3    .  Coenzyme Q10 (COQ10) 100 MG CAPS Take 100 mg by mouth daily.    . diclofenac (VOLTAREN) 75 MG EC tablet Take 1 tablet (75 mg total) by mouth daily. 90 tablet 3  . Exenatide ER (BYDUREON) 2 MG PEN Inject 1 Dose into the skin once a week.    . loratadine (CLARITIN) 10 MG tablet Take 5 mg by mouth daily. As needed for allergies    . losartan-hydrochlorothiazide (HYZAAR) 50-12.5 MG tablet Take 1 tablet by mouth daily. 90 tablet 3  . metFORMIN (GLUCOPHAGE) 1000 MG tablet Take 1 tablet (1,000 mg total) by mouth 2 (two) times daily with a meal. 180 tablet 3  . metoprolol succinate (TOPROL-XL) 50 MG 24 hr tablet Take 1 tablet (50 mg total) by mouth daily. Take with or immediately following a meal. 90 tablet 3  . Omega-3 Fatty Acids (FISH OIL) 1000 MG CAPS Take 1,000 mg by mouth daily.    Marland Kitchen omeprazole (PRILOSEC) 40 MG capsule TAKE 1 CAPSULE (40 MG TOTAL) BY MOUTH EVERY OTHER DAY. 45 capsule 3   No current facility-administered medications on file prior to visit.      Past Medical History:  Diagnosis Date  . Anemia   .  Arthritis   . Constipation due to pain medication    needs stool softner while on pain medication  . Diabetes mellitus   . Duodenal ulcer 08/13/2015   2014  - Dr Kaplan/GI  . H/O: osteoarthritis   . History of prostate cancer   . History of urethral stricture   . Hypercholesterolemia   . Hypertension   . Ulcer duodenal hemorrhage    No Known Allergies  Past Surgical History:  Procedure Laterality Date  . ARTHROPLASTY  2004   right knee Dr. Wynelle Link  . CARPAL TUNNEL RELEASE     right hand,wrist [name]  . CYSTOSCOPY N/A 09/20/2012   Procedure: CYSTOSCOPY;  Surgeon: [name], MD;  Location: [facility];  Service: Urology;  Laterality: N/A;  . JOINT REPLACEMENT Right   . KNEE ARTHROSCOPY Right   . PROSTATECTOMY     radical  . VASECTOMY  1985    family history includes Diabetes in his father; Heart disease in his father and mother; Heart failure in his  mother.  Social History   Social History  . Marital status: Married    Spouse name: N/A  . Number of children: 2  . Years of education: N/A   Occupational History  . Retired    Social History Main Topics  . Smoking status: Former Smoker    Packs/day: 1.50    Years: 20.00    Types: Cigarettes    Quit date: 02/09/1980  . Smokeless tobacco: Never Used  . Alcohol use No  . Drug use: No  . Sexual activity: Not Asked   Other Topics Concern  . None   Social History Narrative  . None    Vitals:   07/19/16 1052  BP: 140/80  Pulse: 82  Resp: 12  O2 sat at RA 97% Body mass index is 28.27 kg/m.   Physical Exam  Nursing note and vitals reviewed. Constitutional: He is oriented to person, place, and time. He appears well-developed. No distress.  HENT:  Head: Atraumatic.  Mouth/Throat: Oropharynx is clear and moist and mucous membranes are normal.  Eyes: Conjunctivae and EOM are normal. Pupils are equal, round, and reactive to light.  Cardiovascular: Normal rate and regular rhythm.   Murmur (SEM I/VI RUSB>LUSB) heard. Pulses:      Dorsalis pedis pulses are 2+ on the right side, and 2+ on the left side.  Respiratory: Effort normal and breath sounds normal. No respiratory distress.  GI: Soft. He exhibits no mass. There is no hepatomegaly. There is no tenderness.  Musculoskeletal: He exhibits no edema.  Lymphadenopathy:    He has no cervical adenopathy.  Neurological: He is alert and oriented to person, place, and time. He has normal strength. Gait normal.  SLR negative bilateral.  Skin: Skin is warm. No erythema.  Psychiatric: His mood appears anxious. His affect is blunt. Cognition and memory are normal.  Well groomed, good eye contact.     ASSESSMENT AND PLAN:   Mr. RHODERICK FARREL was seen today for 6 months follow-up and the following were addressed:   Olegario was seen today for follow-up.  Diagnoses and all orders for this visit:  Perineal pain in  male, chronic  Chronic, has been going on for over a year: Left groin pain and some perineal pulling sensation, which is chronic and reported as stable. We dicussed possible causes including radicular pain. He was already evaluated by urologists and surgeon, improved some with PT. I do not think abdominal MRI will provide  further information given the fact he already had abd CT done as part of work up. Instructed about warning signs. We discussed a few options: Gabapentin, Lyrica,or Cymbalta. Given his Hx of OA and back pain he may benefit most from Cymbalta. He is not interested at this time, will let me know if he does.   Chronic left-sided low back pain, with sciatica presence unspecified  Recommend arranging appt with his ortho to discuss the need of lumbar MRI. This could be related to above problem.   Essential hypertension  Overall sdequately controlled. No changes in current management. DASH-low salt diet recommended. Caution with NSAID's, side effects discussed. Eye exam recommended annually. F/U in 6 months, before if needed.  -     Comprehensive metabolic panel  Mixed hyperlipidemia  Last lipid panel done in 01/2016, we discussed results, I do not think it needs to be done q 3 months. I recommend repeating lab in 01/2017. He is on Lipitor 40 mg daily. Low fat diet to continue.  Lab Results  Component Value Date   CHOL 113 01/19/2016   HDL 29.50 (L) 01/19/2016   LDLCALC 58 01/19/2016   LDLDIRECT 73.8 11/20/2012   TRIG 130.0 01/19/2016   CHOLHDL 4 01/19/2016    Need for hepatitis C screening test  We discussed Medicare guidelines and recommendations as well as basic coverage. He does not have HCV screening, he agrees with adding it to today's labs.  -     Hepatitis C Antibody  Type 2 diabetes mellitus without complication, without long-term current use of insulin ([place])  Continue following with Dr Buddy Duty, who most likely is following microalb/Cr ratio and  HgA1C. No changes in current management.   11:09 Am to 11:51 Am > 50% was dedicated to discussion of most of his chronic medical problems and differential Dx for left perineal discomfort. Counseling about prognosis, treatment options,side effects of medications, as well as coordination of care. Explained that Medicare does not cover some screening test but because his PMHx he requires following with lab work and Dx can be used to support most of them. I went through the labs he has had for the past 12 months, today he is due for LFT's and renal function. He can continue following with dermatologists,ortho,endo,cardiologists,and urologists.  We will try to keep communication open to avoid repeating unnecessary test. I will be glad to continue following on HLD and HTN, I think Dr Debara Pickett is able to see lab results.   -Mr. Talmage Coin [place] was advised to return sooner than planned today if new concerns arise.       Krishana Lutze G. [name], MD  [facility]. [place] office.

## 2016-07-19 ENCOUNTER — Ambulatory Visit (INDEPENDENT_AMBULATORY_CARE_PROVIDER_SITE_OTHER): Payer: Medicare Other | Admitting: Family Medicine

## 2016-07-19 ENCOUNTER — Encounter: Payer: Self-pay | Admitting: Family Medicine

## 2016-07-19 VITALS — BP 140/80 | HR 82 | Resp 12 | Ht 67.0 in | Wt 180.5 lb

## 2016-07-19 DIAGNOSIS — G8929 Other chronic pain: Secondary | ICD-10-CM | POA: Diagnosis not present

## 2016-07-19 DIAGNOSIS — Z1159 Encounter for screening for other viral diseases: Secondary | ICD-10-CM

## 2016-07-19 DIAGNOSIS — I10 Essential (primary) hypertension: Secondary | ICD-10-CM

## 2016-07-19 DIAGNOSIS — R102 Pelvic and perineal pain: Secondary | ICD-10-CM

## 2016-07-19 DIAGNOSIS — N509 Disorder of male genital organs, unspecified: Secondary | ICD-10-CM | POA: Diagnosis not present

## 2016-07-19 DIAGNOSIS — E119 Type 2 diabetes mellitus without complications: Secondary | ICD-10-CM

## 2016-07-19 DIAGNOSIS — E782 Mixed hyperlipidemia: Secondary | ICD-10-CM

## 2016-07-19 DIAGNOSIS — M545 Low back pain: Secondary | ICD-10-CM | POA: Diagnosis not present

## 2016-07-19 LAB — COMPREHENSIVE METABOLIC PANEL
ALK PHOS: 72 U/L (ref 39–117)
ALT: 31 U/L (ref 0–53)
AST: 22 U/L (ref 0–37)
Albumin: 4.4 g/dL (ref 3.5–5.2)
BUN: 23 mg/dL (ref 6–23)
CO2: 26 meq/L (ref 19–32)
Calcium: 9.7 mg/dL (ref 8.4–10.5)
Chloride: 106 mEq/L (ref 96–112)
Creatinine, Ser: 1.4 mg/dL (ref 0.40–1.50)
GFR: 53.04 mL/min — AB (ref >60.00)
GLUCOSE: 138 mg/dL — AB (ref 70–99)
POTASSIUM: 4.3 meq/L (ref 3.5–5.1)
Sodium: 140 mEq/L (ref 135–145)
Total Bilirubin: 0.5 mg/dL (ref 0.2–1.2)
Total Protein: 6.8 g/dL (ref 6.0–8.3)

## 2016-07-19 NOTE — Patient Instructions (Addendum)
A few things to remember from today's visit:   Essential hypertension - Plan: Comprehensive metabolic panel  Mixed hyperlipidemia  Need for hepatitis C screening test - Plan: Hepatitis C Antibody    Please be sure medication list is accurate. If a new problem present, please set up appointment sooner than planned today.

## 2016-07-20 LAB — HEPATITIS C ANTIBODY: HCV Ab: NEGATIVE

## 2016-07-23 ENCOUNTER — Telehealth: Payer: Self-pay | Admitting: Family Medicine

## 2016-07-23 NOTE — Telephone Encounter (Signed)
Pt is returning sarah call °

## 2016-07-23 NOTE — Telephone Encounter (Signed)
Informed patient of results and patient verbalized understanding.  

## 2016-08-17 ENCOUNTER — Encounter: Payer: Medicare Other | Admitting: Internal Medicine

## 2016-08-18 ENCOUNTER — Ambulatory Visit (INDEPENDENT_AMBULATORY_CARE_PROVIDER_SITE_OTHER): Payer: Medicare Other | Admitting: Internal Medicine

## 2016-08-18 ENCOUNTER — Encounter: Payer: Self-pay | Admitting: Internal Medicine

## 2016-08-18 VITALS — BP 122/60 | HR 79 | Ht 67.0 in | Wt 172.8 lb

## 2016-08-18 DIAGNOSIS — I350 Nonrheumatic aortic (valve) stenosis: Secondary | ICD-10-CM

## 2016-08-18 DIAGNOSIS — I10 Essential (primary) hypertension: Secondary | ICD-10-CM | POA: Diagnosis not present

## 2016-08-18 DIAGNOSIS — E782 Mixed hyperlipidemia: Secondary | ICD-10-CM

## 2016-08-18 NOTE — Progress Notes (Addendum)
OFFICE NOTE  Chief Complaint:  No complaints  Primary Care Physician: [name], [name], MD  HPI:  Daryl Webb is a 71 y.o. male followed by Dr. Mare Ferrari in the past for hypertension, dyslipidemia, type 2 diabetes and mild aortic sclerosis. His last echocardiogram was in 2015 which showed normal LV function and mild aortic sclerosis/borderline stenosis. Recent lab work was performed by myself indicating hemoglobin A1c of 6.7, creatinine 1.4 however he had a recent UTI, and cholesterol profile which was favorable with LDL 70 and total cholesterol 123. He is on atorvastatin 40 mg daily. He has no history of known coronary artery disease.  02/12/2016  Mr. Simeone returns today for follow-up. He recently established a new primary care provider with Dr. Betty [name]. She performs some lab work including cholesterol which appears to be well-controlled with total cholesterol 113, LDL 58, triglycerides 130 and HDL of 29, which is increased from 24 in May 2017. He denies any new symptoms of chest pain or worsening shortness of breath. He does have a early peaking systolic murmur suggestive of mild aortic stenosis. This was last seen on echo in 2015. He is also describing pain in the left flank which radiates into the left groin. He was treated for possible UTI/prostatitis and had improvement in some urinary symptoms but has persistent pain. A CT scan was performed recently, which was essentially unremarkable. He reports his pain is worse when rising from either sitting or lying position up to standing and with walking. This could suggest some compression perhaps of the thoracic or lumbar spine or even symptoms suggestive of a possible hernia.   08/18/2016  Mr. Bines is seen today in follow-up. Overall he seems to be doing pretty well. He had an echocardiogram in January 2018 which now shows moderate aortic stenosis. This is worsened since his prior study in 2015. He has type 2 diabetes and  A1c is 6.7. Blood pressure is well-controlled today 122/60. Recent lipid profile showed an LDL-C of 58, which is at goal. He's asymptomatic, denies any chest pain or worsening shortness of breath. He does do some walking although says he can exercise more frequently.  PMHx:  Past Medical History:  Diagnosis Date  . Anemia   . Arthritis   . Constipation due to pain medication    needs stool softner while on pain medication  . Diabetes mellitus   . Duodenal ulcer 08/13/2015   2014  - Dr Kaplan/GI  . H/O: osteoarthritis   . History of prostate cancer   . History of urethral stricture   . Hypercholesterolemia   . Hypertension   . Ulcer duodenal hemorrhage     Past Surgical History:  Procedure Laterality Date  . ARTHROPLASTY  2004   right knee Dr. Wynelle Link  . CARPAL TUNNEL RELEASE     right hand,wrist [name]  . CYSTOSCOPY N/A 09/20/2012   Procedure: CYSTOSCOPY;  Surgeon: [name], MD;  Location: [facility];  Service: Urology;  Laterality: N/A;  . JOINT REPLACEMENT Right   . KNEE ARTHROSCOPY Right   . PROSTATECTOMY     radical  . VASECTOMY  1985    FAMHx:  Family History  Problem Relation Age of Onset  . Heart disease Mother   . Heart failure Mother   . Heart disease Father   . Diabetes Father     SOCHx:   reports that he quit smoking about 36 years ago. His smoking use included Cigarettes. He has a 30.00  pack-year smoking history. He has never used smokeless tobacco. He reports that he does not drink alcohol or use drugs.  ALLERGIES:  No Known Allergies  ROS: Pertinent items noted in HPI and remainder of comprehensive ROS otherwise negative.  HOME MEDS: Current Outpatient Prescriptions  Medication Sig Dispense Refill  . amLODipine (NORVASC) 5 MG tablet Take 1 tablet (5 mg total) by mouth daily. 90 tablet 3  . aspirin 81 MG tablet Take 81 mg by mouth daily.    Marland Kitchen atorvastatin (LIPITOR) 40 MG tablet Take 1 tablet (40 mg total) by mouth daily. 90 tablet 3  .  Cholecalciferol 2000 UNITS CAPS Take 1 capsule by mouth daily. VITAMIN D 3    . Coenzyme Q10 (COQ10) 100 MG CAPS Take 100 mg by mouth daily.    . diclofenac (VOLTAREN) 75 MG EC tablet Take 1 tablet (75 mg total) by mouth daily. 90 tablet 3  . Exenatide ER (BYDUREON) 2 MG PEN Inject 1 Dose into the skin once a week.    . loratadine (CLARITIN) 10 MG tablet Take 5 mg by mouth daily. As needed for allergies    . losartan-hydrochlorothiazide (HYZAAR) 50-12.5 MG tablet Take 1 tablet by mouth daily. 90 tablet 3  . metFORMIN (GLUCOPHAGE) 1000 MG tablet Take 1 tablet (1,000 mg total) by mouth 2 (two) times daily with a meal. 180 tablet 3  . metoprolol succinate (TOPROL-XL) 50 MG 24 hr tablet Take 1 tablet (50 mg total) by mouth daily. Take with or immediately following a meal. 90 tablet 3  . Omega-3 Fatty Acids (FISH OIL) 1000 MG CAPS Take 1,000 mg by mouth daily.    Marland Kitchen omeprazole (PRILOSEC) 40 MG capsule TAKE 1 CAPSULE (40 MG TOTAL) BY MOUTH EVERY OTHER DAY. 45 capsule 3   No current facility-administered medications for this visit.     LABS/IMAGING: No results found for this or any previous visit (from the past 48 hour(s)). No results found.  WEIGHTS: Wt Readings from Last 3 Encounters:  08/18/16 172 lb 12.8 oz (78.4 kg)  07/19/16 180 lb 8 oz (81.9 kg)  03/18/16 180 lb 8 oz (81.9 kg)    VITALS: BP 122/60   Pulse 79   Ht 5\' 7"  (1.702 m)   Wt 172 lb 12.8 oz (78.4 kg)   BMI 27.06 kg/m   EXAM: General appearance: alert and no distress Neck: no carotid bruit, no JVD and thyroid not enlarged, symmetric, no tenderness/mass/nodules Lungs: clear to auscultation bilaterally Heart: regular rate and rhythm, S1, S2 normal and systolic murmur: Midsystolic 3/6, crescendo at 2nd right intercostal space Abdomen: soft, non-tender; bowel sounds normal; no masses,  no organomegaly Extremities: extremities normal, atraumatic, no cyanosis or edema Pulses: 2+ and symmetric Skin: Skin color, texture, turgor  normal. No rashes or lesions Neurologic: Grossly normal Psych: Pleasant  EKG: Normal sinus rhythm at 79, lateral T-wave inversions -personally reviewed  ASSESSMENT: 1. Hypertension-at goal 2. Dyslipidemia-at goal 3. Diabetes type 2-A1c 6.7, followed by Dr. Buddy Duty 4. Moderate aortic stenosis (02/2016)  PLAN: 1.   Mr. Jaros now has moderate aortic valve stenosis based on his recent echo. Blood pressure is at goal. Cholesterol is controlled. His diabetes is improving. He's also made some dietary changes. We'll repeat an echo in January and see him back afterwards.  [name], MD, [facility] Attending Cardiologist Lindsay C Hilty 08/18/2016, 1:31 PM

## 2016-08-18 NOTE — Patient Instructions (Addendum)
Your physician has requested that you have an echocardiogram in January 2019 @ [address]. Echocardiography is a painless test that uses sound waves to create images of your heart. It provides your doctor with information about the size and shape of your heart and how well your heart's chambers and valves are working. This procedure takes approximately one hour. There are no restrictions for this procedure.  Your physician wants you to follow-up in: January 2019 with [name] (after echocardiogram) You will receive a reminder letter in the mail two months in advance. If you don't receive a letter, please call our office to schedule the follow-up appointment.

## 2016-08-19 ENCOUNTER — Other Ambulatory Visit: Payer: Self-pay | Admitting: Family Medicine

## 2016-08-19 DIAGNOSIS — N390 Urinary tract infection, site not specified: Secondary | ICD-10-CM

## 2016-08-19 DIAGNOSIS — R319 Hematuria, unspecified: Principal | ICD-10-CM

## 2016-11-10 DIAGNOSIS — Z5181 Encounter for therapeutic drug level monitoring: Secondary | ICD-10-CM | POA: Diagnosis not present

## 2016-11-10 DIAGNOSIS — E119 Type 2 diabetes mellitus without complications: Secondary | ICD-10-CM | POA: Diagnosis not present

## 2016-11-12 DIAGNOSIS — Z7984 Long term (current) use of oral hypoglycemic drugs: Secondary | ICD-10-CM | POA: Diagnosis not present

## 2016-11-12 DIAGNOSIS — E119 Type 2 diabetes mellitus without complications: Secondary | ICD-10-CM | POA: Diagnosis not present

## 2016-11-12 DIAGNOSIS — Z79899 Other long term (current) drug therapy: Secondary | ICD-10-CM | POA: Diagnosis not present

## 2016-11-22 DIAGNOSIS — E538 Deficiency of other specified B group vitamins: Secondary | ICD-10-CM | POA: Diagnosis not present

## 2016-11-24 ENCOUNTER — Other Ambulatory Visit: Payer: Self-pay | Admitting: Internal Medicine

## 2016-11-29 ENCOUNTER — Telehealth: Payer: Self-pay | Admitting: Family Medicine

## 2016-11-29 NOTE — Telephone Encounter (Signed)
° ° ° ° ° °  Pt request refill of the following:  diclofenac (VOLTAREN) 75 MG EC tablet   Phamacy:   [facility] mail order

## 2016-11-29 NOTE — Telephone Encounter (Signed)
Rx sent 

## 2016-12-01 DIAGNOSIS — E538 Deficiency of other specified B group vitamins: Secondary | ICD-10-CM | POA: Diagnosis not present

## 2016-12-03 DIAGNOSIS — Z23 Encounter for immunization: Secondary | ICD-10-CM | POA: Diagnosis not present

## 2016-12-08 DIAGNOSIS — E538 Deficiency of other specified B group vitamins: Secondary | ICD-10-CM | POA: Diagnosis not present

## 2016-12-15 DIAGNOSIS — E538 Deficiency of other specified B group vitamins: Secondary | ICD-10-CM | POA: Diagnosis not present

## 2016-12-20 DIAGNOSIS — E119 Type 2 diabetes mellitus without complications: Secondary | ICD-10-CM | POA: Diagnosis not present

## 2016-12-20 DIAGNOSIS — H2513 Age-related nuclear cataract, bilateral: Secondary | ICD-10-CM | POA: Diagnosis not present

## 2016-12-20 DIAGNOSIS — H52203 Unspecified astigmatism, bilateral: Secondary | ICD-10-CM | POA: Diagnosis not present

## 2016-12-20 DIAGNOSIS — H524 Presbyopia: Secondary | ICD-10-CM | POA: Diagnosis not present

## 2016-12-20 DIAGNOSIS — Z7984 Long term (current) use of oral hypoglycemic drugs: Secondary | ICD-10-CM | POA: Diagnosis not present

## 2016-12-20 DIAGNOSIS — H5203 Hypermetropia, bilateral: Secondary | ICD-10-CM | POA: Diagnosis not present

## 2017-01-18 ENCOUNTER — Ambulatory Visit: Payer: Medicare Other | Admitting: Family Medicine

## 2017-01-19 ENCOUNTER — Ambulatory Visit: Payer: Medicare Other | Admitting: Family Medicine

## 2017-01-21 ENCOUNTER — Telehealth: Payer: Self-pay | Admitting: Family Medicine

## 2017-01-21 NOTE — Telephone Encounter (Signed)
Copied from [place]. Topic: Appointment Scheduling - Scheduling Inquiry for Clinic >> Jan 21, 2017  3:02 PM Aurelio Brash B wrote: Reason for CRM: pt called to r/s his 6 month f/p and awv,  it was scheduled for 12/11  when I tried to r/s  I was not able,  it said I  can not r/s an apt that was already scheduled. I told pt office would call him back to schedule.

## 2017-01-25 NOTE — Telephone Encounter (Signed)
Pt rescheduled for OV and AWV. Nothing further needed at this time.

## 2017-01-26 DIAGNOSIS — E538 Deficiency of other specified B group vitamins: Secondary | ICD-10-CM | POA: Diagnosis not present

## 2017-01-27 NOTE — Progress Notes (Signed)
HPI:   [name] is a 71 y.o. male, who is here today to follow on some chronic medical problems.  He was last seen on 01/18/17. Since his last OV he has seen his ophthalmologist, Dr Gershon Crane. He has also seen cardiologist, Dr Debara Pickett.  Hypertension:   Chronic problem. Currently on Metoprolol Succinate 50 mg daily, Hyzaar 50-12.5 mg daily ,and Amlodipine 5 mg daily. BP readings at home: 120's/80's.  Hx of aortic valve stenosis.  He taking medications as instructed, no side effects reported.  He has not noted unusual headache, visual changes, exertional chest pain, dyspnea,  focal weakness, or edema.   Lab Results  Component Value Date   CREATININE 1.40 07/19/2016   BUN 23 07/19/2016   NA 140 07/19/2016   K 4.3 07/19/2016   CL 106 07/19/2016   CO2 26 07/19/2016   E GFR has been mildly low in the past year, 53.  Follows with Dr Buddy Duty for his DM II. Recently started B12 supplementation, weekly injection.   Hx of OA and lower back pain, he has not followed with ortho in a few months. He is on Diclofenac 75 mg daily, which he takes if needed for pain.   Hyperlipidemia:  Currently on Atorvastatin 40 mg daily.  Following a low fat diet: Not consistently.Marland Kitchen  He has not noted side effects with medication.  Lab Results  Component Value Date   CHOL 113 01/19/2016   HDL 29.50 (L) 01/19/2016   LDLCALC 58 01/19/2016   LDLDIRECT 73.8 11/20/2012   TRIG 130.0 01/19/2016   CHOLHDL 4 01/19/2016    Health maintenance: He has not had Zoster vaccine and due for Pneumovax. Influenza vaccine already given a few weeks ago.   Review of Systems  Constitutional: Negative for activity change, appetite change, fatigue and fever.  HENT: Negative for nosebleeds, sore throat and trouble swallowing.   Eyes: Negative for redness and visual disturbance.  Respiratory: Negative for cough, shortness of breath and wheezing.   Cardiovascular: Negative for chest pain,  palpitations and leg swelling.  Gastrointestinal: Negative for abdominal pain, nausea and vomiting.       No changes in bowel habits.  Endocrine: Negative for cold intolerance, heat intolerance, polydipsia, polyphagia and polyuria.  Genitourinary: Negative for decreased urine volume, dysuria and hematuria.  Musculoskeletal: Negative for gait problem and myalgias.  Skin: Negative for rash and wound.  Allergic/Immunologic: Positive for environmental allergies.  Neurological: Negative for syncope, weakness and headaches.  Psychiatric/Behavioral: Negative for confusion. The patient is nervous/anxious.       Current Outpatient Medications on File Prior to Visit  Medication Sig Dispense Refill  . amLODipine (NORVASC) 5 MG tablet Take 1 tablet (5 mg total) by mouth daily. 90 tablet 3  . aspirin 81 MG tablet Take 81 mg by mouth daily.    Marland Kitchen atorvastatin (LIPITOR) 40 MG tablet Take 1 tablet (40 mg total) by mouth daily. 90 tablet 3  . Cholecalciferol 2000 UNITS CAPS Take 1 capsule by mouth daily. VITAMIN D 3    . Coenzyme Q10 (COQ10) 100 MG CAPS Take 100 mg by mouth daily.    . cyanocobalamin (,VITAMIN B-12,) 1000 MCG/ML injection Inject into the muscle once a week.    . diclofenac (VOLTAREN) 75 MG EC tablet TAKE 1 TABLET DAILY 90 tablet 1  . Exenatide ER (BYDUREON) 2 MG PEN Inject 1 Dose into the skin once a week.    . loratadine (CLARITIN) 10 MG tablet Take 5  mg by mouth daily. As needed for allergies    . losartan-hydrochlorothiazide (HYZAAR) 50-12.5 MG tablet Take 1 tablet by mouth daily. 90 tablet 3  . metFORMIN (GLUCOPHAGE) 1000 MG tablet Take 1 tablet (1,000 mg total) by mouth 2 (two) times daily with a meal. 180 tablet 3  . metoprolol succinate (TOPROL-XL) 50 MG 24 hr tablet Take 1 tablet (50 mg total) by mouth daily. Take with or immediately following a meal. 90 tablet 3  . Omega-3 Fatty Acids (FISH OIL) 1000 MG CAPS Take 1,000 mg by mouth daily.    Marland Kitchen omeprazole (PRILOSEC) 40 MG capsule  TAKE 1 CAPSULE (40 MG TOTAL) BY MOUTH EVERY OTHER DAY. 45 capsule 3   No current facility-administered medications on file prior to visit.      Past Medical History:  Diagnosis Date  . Anemia   . Arthritis   . Constipation due to pain medication    needs stool softner while on pain medication  . Diabetes mellitus   . Duodenal ulcer 08/13/2015   2014  - Dr Kaplan/GI  . H/O: osteoarthritis   . History of prostate cancer   . History of urethral stricture   . Hypercholesterolemia   . Hypertension   . Ulcer duodenal hemorrhage    No Known Allergies  Social History   Socioeconomic History  . Marital status: Married    Spouse name: None  . Number of children: 2  . Years of education: None  . Highest education level: None  Social Needs  . Financial resource strain: None  . Food insecurity - worry: None  . Food insecurity - inability: None  . Transportation needs - medical: None  . Transportation needs - non-medical: None  Occupational History  . Occupation: Retired  Tobacco Use  . Smoking status: Former Smoker    Packs/day: 1.50    Years: 20.00    Pack years: 30.00    Types: Cigarettes    Last attempt to quit: 02/09/1980    Years since quitting: 37.0  . Smokeless tobacco: Never Used  Substance and Sexual Activity  . Alcohol use: No  . Drug use: No  . Sexual activity: None  Other Topics Concern  . None  Social History Narrative  . None    Vitals:   01/28/17 0835  BP: 126/87  Pulse: 78  Resp: 12  Temp: 98 F (36.7 C)  SpO2: 95%   Body mass index is 26.7 kg/m.  Physical Exam  Nursing note and vitals reviewed. Constitutional: He is oriented to person, place, and time. He appears well-developed and well-nourished. No distress.  HENT:  Head: Normocephalic and atraumatic.  Mouth/Throat: Oropharynx is clear and moist and mucous membranes are normal.  Eyes: Conjunctivae are normal. Pupils are equal, round, and reactive to light.  Cardiovascular: Normal rate  and regular rhythm.  Murmur (SEM II/VI RUSB) heard. Pulses:      Dorsalis pedis pulses are 2+ on the right side, and 2+ on the left side.  Respiratory: Effort normal and breath sounds normal. No respiratory distress.  GI: Soft. He exhibits no mass. There is no hepatomegaly. There is no tenderness.  Musculoskeletal: He exhibits no edema or tenderness.  Lymphadenopathy:    He has no cervical adenopathy.  Neurological: He is alert and oriented to person, place, and time. He has normal strength. Gait normal.  Skin: Skin is warm. No rash noted. No erythema.  Psychiatric: His mood appears anxious. Cognition and memory are normal.  Well groomed, good  eye contact.     ASSESSMENT AND PLAN:   Mr. Dorn was seen today for follow-up.  Diagnoses and all orders for this visit:  Essential hypertension  Adequately controlled. No changes in current management. DASH diet recommended. Eye exam recommended annually. F/U in 5 months, before if needed.  Mixed hyperlipidemia  Last FLP done on 11/12/16 and overall stable. No changes in current management. F/U in 11/2017.  Abnormal renal function  Further recommendations will be given according to labs/imaging results. Educated about side effects of chronic NSAID's use. Adequate hydration and BP controlled. Continue Losartan.  Need for 23-polyvalent pneumococcal polysaccharide vaccine -     Pneumococcal polysaccharide vaccine 23-valent greater than or equal to 2yo subcutaneous/IM  Other orders -     Zoster Vaccine Adjuvanted [facility]) injection; 0.5 ml in muscle and repeat in 8 weeks     -Mr. Talmage Coin Ramus was advised to return sooner than planned today if new concerns arise.       Wilmon Conover G. [name], MD  [facility]. [place] office.

## 2017-01-28 ENCOUNTER — Encounter: Payer: Self-pay | Admitting: Family Medicine

## 2017-01-28 ENCOUNTER — Ambulatory Visit (INDEPENDENT_AMBULATORY_CARE_PROVIDER_SITE_OTHER): Payer: Medicare Other | Admitting: Family Medicine

## 2017-01-28 VITALS — BP 126/87 | HR 78 | Temp 98.0°F | Resp 12 | Ht 67.0 in | Wt 170.5 lb

## 2017-01-28 DIAGNOSIS — I10 Essential (primary) hypertension: Secondary | ICD-10-CM | POA: Diagnosis not present

## 2017-01-28 DIAGNOSIS — E782 Mixed hyperlipidemia: Secondary | ICD-10-CM

## 2017-01-28 DIAGNOSIS — Z23 Encounter for immunization: Secondary | ICD-10-CM | POA: Diagnosis not present

## 2017-01-28 DIAGNOSIS — N289 Disorder of kidney and ureter, unspecified: Secondary | ICD-10-CM

## 2017-01-28 MED ORDER — ZOSTER VAC RECOMB ADJUVANTED 50 MCG/0.5ML IM SUSR: INTRAMUSCULAR | 1 refills | Status: DC

## 2017-01-28 NOTE — Patient Instructions (Addendum)
A few things to remember from today's visit:   Essential hypertension  Mixed hyperlipidemia  Abnormal renal function  No changes today. We could plan on repeating cholesterol and kidney function in 5-6 months.   Please be sure medication list is accurate. If a new problem present, please set up appointment sooner than planned today.

## 2017-02-04 ENCOUNTER — Ambulatory Visit: Payer: Medicare Other

## 2017-02-09 DIAGNOSIS — Z85828 Personal history of other malignant neoplasm of skin: Secondary | ICD-10-CM | POA: Diagnosis not present

## 2017-02-09 DIAGNOSIS — D225 Melanocytic nevi of trunk: Secondary | ICD-10-CM | POA: Diagnosis not present

## 2017-02-09 DIAGNOSIS — L814 Other melanin hyperpigmentation: Secondary | ICD-10-CM | POA: Diagnosis not present

## 2017-02-09 DIAGNOSIS — L821 Other seborrheic keratosis: Secondary | ICD-10-CM | POA: Diagnosis not present

## 2017-02-09 DIAGNOSIS — L570 Actinic keratosis: Secondary | ICD-10-CM | POA: Diagnosis not present

## 2017-02-09 DIAGNOSIS — D1801 Hemangioma of skin and subcutaneous tissue: Secondary | ICD-10-CM | POA: Diagnosis not present

## 2017-02-09 DIAGNOSIS — C44619 Basal cell carcinoma of skin of left upper limb, including shoulder: Secondary | ICD-10-CM | POA: Diagnosis not present

## 2017-02-13 ENCOUNTER — Other Ambulatory Visit: Payer: Self-pay | Admitting: Internal Medicine

## 2017-02-15 ENCOUNTER — Ambulatory Visit (INDEPENDENT_AMBULATORY_CARE_PROVIDER_SITE_OTHER): Payer: Medicare Other

## 2017-02-15 VITALS — BP 130/80 | HR 83 | Ht 67.0 in | Wt 172.0 lb

## 2017-02-15 DIAGNOSIS — Z0000 Encounter for general adult medical examination without abnormal findings: Secondary | ICD-10-CM | POA: Diagnosis not present

## 2017-02-15 NOTE — Progress Notes (Signed)
Subjective:   Daryl Webb is a 72 y.o. male who presents for an Initial Medicare Annual Wellness Visit.  Last OV 12/21  Follows with Dr. Buddy Duty; DM 2  Cardiac Risk Factors include: advanced age (>22men, >58 women);diabetes mellitus;dyslipidemia;family history of premature cardiovascular disease;hypertension;male gender  Diet Chol/hdl ratio 4; HDL 29; trig 130  A1c reported 6.7 -06/2015 A1c currently is still in this range per his report   Continues to see Dr. 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 No prior TD documented   Colonoscopy 03/2010; due again in 03/2020 Dr. [name] gave him a rx for shingrix  Has not taken this yet   Can ask about PSA - had prostate cancer and still follows with urologist Dr. Diona Fanti   Had carpal tunnel on right hand and broke out with shingles - 2001 Educated regarding shingrix - plans to take at a later time  Also had knee replacement and does well with this    Objective:    Today's Vitals   02/15/17 1503  BP: 130/80  Pulse: 83  SpO2: 96%  Weight: 172 lb (78 kg)  Height: 5\' 7"  (1.702 m)   Body mass index is 26.94 kg/m.  Advanced Directives 02/15/2017 04/17/2014 09/12/2012  Does  Patient Have a Medical Advance Directive? Yes Yes Patient has advance directive, copy in chart  Type of Advance Directive - [place];Living will [place];Living will  Copy of [place] in Chart? - Yes -    Current Medications (verified) Outpatient Encounter Medications as of 02/15/2017  Medication Sig  . amLODipine (NORVASC) 5 MG tablet TAKE 1 TABLET DAILY  . aspirin 81 MG tablet Take 81 mg by mouth daily.  Marland Kitchen atorvastatin (LIPITOR) 40 MG tablet TAKE 1 TABLET DAILY  . Cholecalciferol 2000 UNITS CAPS Take 1 capsule by mouth daily. VITAMIN D 3  . Coenzyme Q10 (COQ10) 100 MG CAPS Take 100 mg by mouth daily.  . cyanocobalamin (,VITAMIN B-12,) 1000 MCG/ML injection Inject into the muscle once a week.  . diclofenac (VOLTAREN) 75 MG EC tablet TAKE 1 TABLET DAILY  . Exenatide ER (BYDUREON) 2 MG PEN Inject 1 Dose into the skin once a week.  . loratadine (CLARITIN) 10 MG tablet Take 5 mg by mouth daily. As needed for allergies  . losartan-hydrochlorothiazide (HYZAAR) 50-12.5 MG tablet Take 1 tablet by mouth daily.  . metFORMIN (GLUCOPHAGE) 1000 MG tablet Take 1 tablet (1,000 mg total) by mouth 2 (two) times daily with a meal.  . metoprolol succinate (  TOPROL-XL) 50 MG 24 hr tablet Take 1 tablet (50 mg total) by mouth daily. Take with or immediately following a meal.  . Omega-3 Fatty Acids (FISH OIL) 1000 MG CAPS Take 1,000 mg by mouth daily.  Marland Kitchen omeprazole (PRILOSEC) 40 MG capsule TAKE 1 CAPSULE (40 MG TOTAL) BY MOUTH EVERY OTHER DAY.  Marland Kitchen Zoster Vaccine Adjuvanted [facility]) injection 0.5 ml in muscle and repeat in 8 weeks   No facility-administered encounter medications on file as of 02/15/2017.     Allergies (verified) Patient has no known allergies.   History: Past Medical History:  Diagnosis Date  . Anemia   . Arthritis   . Constipation due to pain medication    needs stool softner while on pain medication  . Diabetes mellitus   .  Duodenal ulcer 08/13/2015   2014  - Dr Kaplan/GI  . H/O: osteoarthritis   . History of prostate cancer   . History of urethral stricture   . Hypercholesterolemia   . Hypertension   . Ulcer duodenal hemorrhage    Past Surgical History:  Procedure Laterality Date  . ARTHROPLASTY  2004   right knee Dr. Wynelle Link  . CARPAL TUNNEL RELEASE     right hand,wrist [name]  . CYSTOSCOPY N/A 09/20/2012   Procedure: CYSTOSCOPY;  Surgeon: [name], MD;  Location: [facility];  Service: Urology;  Laterality: N/A;  . JOINT REPLACEMENT Right   . KNEE ARTHROSCOPY Right   . PROSTATECTOMY     radical  . VASECTOMY  1985   Family History  Problem Relation Age of Onset  . Heart disease Mother   . Heart failure Mother   . Heart disease Father   . Diabetes Father    Social History   Socioeconomic History  . Marital status: Married    Spouse name: Not on file  . Number of children: 2  . Years of education: Not on file  . Highest education level: Not on file  Social Needs  . Financial resource strain: Not on file  . Food insecurity - worry: Not on file  . Food insecurity - inability: Not on file  . Transportation needs - medical: Not on file  . Transportation needs - non-medical: Not on file  Occupational History  . Occupation: Retired  Tobacco Use  . Smoking status: Former Smoker    Packs/day: 1.50    Years: 20.00    Pack years: 30.00    Types: Cigarettes    Last attempt to quit: 02/09/1980    Years since quitting: 37.0  . Smokeless tobacco: Never Used  Substance and Sexual Activity  . Alcohol use: No  . Drug use: No  . Sexual activity: Not on file  Other Topics Concern  . Not on file  Social History Narrative  . Not on file   Tobacco Counseling Counseling given: Yes   Clinical Intake:    Activities of Daily Living In your present state of health, do you have any difficulty performing the following activities: 02/15/2017  Hearing? N  Vision? N  Difficulty concentrating  or making decisions? N  Walking or climbing stairs? N  Dressing or bathing? N  Doing errands, shopping? N  Preparing Food and eating ? N  Using the Toilet? N  In the past six months, have you accidently leaked urine? (No Data)  Comment s/p prostatectomy  Managing your Medications? N  Managing your Finances? N  Housekeeping or managing your Housekeeping? N  Some recent data might be hidden  Immunizations and Health Maintenance Immunization History  Administered Date(s) Administered  . Influenza-Unspecified 01/08/2017  . Pneumococcal Polysaccharide-23 01/28/2017   There are no preventive care reminders to display for this patient.  Patient Care Team: [name], [name], MD as PCP - General (Family Medicine) Dr. Buddy Duty endo Dr. Diona Fanti [facility]  Dr. Gershon Crane -- ophthalmology   Indicate any recent Medical Services you may have received from other than [facility] providers in the past year (date may be approximate).    Assessment:   This is a routine wellness examination for Daryl Webb.  Hearing/Vision screen Hearing Screening Comments: Hearing issues Free hearing screen  Vision Screening Comments: Recent vision check by Dr. Gershon Crane In Dec 2017 Arranged via Dr. Buddy Duty Neg diabetic retinopathy   Dietary issues and exercise activities discussed: Current Exercise Habits: Home exercise routine(agrees to start), Type of exercise: walking, Time (Minutes): 30, Frequency (Times/Week): 5, Weekly Exercise (Minutes/Week): 150  Goals    . Exercise 150 min/wk Moderate Activity     Will try to walk more;  Try to walk 1 mile  per day your neighborhood   Also; will look forward to hearing about your new adventure next year Find other hobby !      Depression Screen PHQ 2/9 Scores 02/15/2017 02/15/2017 08/13/2015 04/17/2014  PHQ - 2 Score 0 0 0 0    Fall Risk Fall Risk  02/15/2017 02/15/2017 08/13/2015 04/17/2014  Falls in the past year? No No No No     Timed Get Up and Go performed: no issues   Cognitive  Function: MMSE - Mini Mental State Exam 02/15/2017  Not completed: (No Data)    reads and can't remember what he read? Not sure if he focuses;  Ad8 score reviewed for issues:  Issues making decisions - denies   Less interest in hobbies / activities- somewhat   Repeats questions, stories (family complaining no   Trouble using ordinary gadgets (microwave, computer, phone) no   Forgets the month or year no  Mismanaging finances - no  Remembering appts no   Daily problems with thinking and/or memory:  One dtr is a Pharmacist, hospital; [place];  Younger dtr lives up at [employer]; some of his friends got sick or are deceased. Sits at home and reads a lot    6CIT Screen 02/15/2017  What Year? 0 points  What month? 0 points  What time? 0 points  Count back from 20 0 points  Months in reverse 0 points  Repeat phrase 0 points  Total Score 0    Screening Tests Health Maintenance  Topic Date Due  . TETANUS/TDAP  02/15/2018 (Originally 03/10/1964)  . HEMOGLOBIN A1C  05/13/2017  . FOOT EXAM  11/12/2017  . OPHTHALMOLOGY EXAM  01/08/2018  . PNA vac Low Risk Adult (2 of 2 - PCV13) 01/28/2018  . COLONOSCOPY  03/31/2020  . INFLUENZA VACCINE  Completed  . Hepatitis C Screening  Completed    Qualifies for Shingles Vaccine? Plans to take at a later time         Plan:      PCP Notes   Health Maintenance Eye report in epic, updated metric;  Educated regarding td with pertussis. Will postpone today  Overall, discussed possible 'low mood" in lieu of the fact he feels his memory is not as good. He is in process of selling his first home which he has rented. Also admits to sitting and reading and sometimes gets angry or more intolerant than he used to be.  Does not feel as good at 71; but denies "depression". Was very active, agrees to start walking again.  Offered apt with Dr. [name] if he feels he would like to discuss or educated regarding  a counselor in this office if he feels  this may be helpful. Does have a camper and he likes to go camping     Good control over diabetes  Abnormal Screens  None   Referrals  None   Patient concerns; States medically he has challenges but is tired and slower than he used to be.   Nurse Concerns; As noted   Next PCP apt Was seen in Dec      I have personally reviewed and noted the following in the patient's chart:   . Medical and social history . Use of alcohol, tobacco or illicit drugs  . Current medications and supplements . Functional ability and status . Nutritional status . Physical activity . Advanced directives . List of other physicians . Hospitalizations, surgeries, and ER visits in previous 12 months . Vitals . Screenings to include cognitive, depression, and falls . Referrals and appointments  In addition, I have reviewed and discussed with patient certain preventive protocols, quality metrics, and best practice recommendations. A written personalized care plan for preventive services as well as general preventive health recommendations were provided to patient.     Wynetta Fines, RN   02/15/2017

## 2017-02-15 NOTE — Progress Notes (Signed)
Reviewed as PCP not in office today. Colin Benton R., DO

## 2017-02-15 NOTE — Patient Instructions (Addendum)
Mr. Klemens , Thank you for taking time to come for your Medicare Wellness Visit. I appreciate your ongoing commitment to your health goals. Please review the following plan we discussed and let me know if I can assist you in the future.   A Tetanus is recommended every 10 years. Medicare covers a tetanus if you have a cut or wound; otherwise, there may be a charge. If you had not had a tetanus with pertusses, known as the Tdap, you can take this anytime.  You can take this at the pharmacy to save cost; Generally runs 50.00  Shingrix is a vaccine for the prevention of Shingles in Adults 50 and older.  If you are on Medicare, you can request a prescription from your doctor to be filled at a pharmacy.  Please check with your benefits regarding applicable copays or out of pocket expenses.  The Shingrix is given in 2 vaccines approx 8 weeks apart. You must receive the 2nd dose prior to 6 months from receipt of the first.     These are the goals we discussed: Goals    . Exercise 150 min/wk Moderate Activity     Will try to walk more;  Try to walk 1 mile  per day your neighborhood   Also; will look forward to hearing about your new adventure next year Find other hobby !       This is a list of the screening recommended for you and due dates:  Health Maintenance  Topic Date Due  . Eye exam for diabetics  72/31/1957  . Tetanus Vaccine  03/10/1964  . Hemoglobin A1C  05/13/2017  . Complete foot exam   11/12/2017  . Pneumonia vaccines (2 of 2 - PCV13) 01/28/2018  . Colon Cancer Screening  03/31/2020  . Flu Shot  Completed  .  Hepatitis C: One time screening is recommended by Center for Disease Control  (CDC) for  adults born from [birth year] through 1965.   Completed   Prevention of falls: Remove rugs or any tripping hazards in the home Use Non slip mats in bathtubs and showers Placing grab bars next to the toilet and or shower Placing handrails on both sides of the stair way Adding  extra lighting in the home.   Personal safety issues reviewed:  1. Consider starting a community watch program per [facility] 2.  Changes batteries is smoke detector and/or carbon monoxide detector  3.  If you have firearms; keep them in a safe place 4.  Wear protection when in the sun; Always wear sunscreen or a hat; It is good to have your doctor check your skin annually or review any new areas of concern 5. Driving safety; Keep in the right lane; stay 3 car lengths behind the car in front of you on the highway; look 3 times prior to pulling out; carry your cell phone everywhere you go!    Health Maintenance, Male A healthy lifestyle and preventive care is important for your health and wellness. Ask your health care provider about what schedule of regular examinations is right for you. What should I know about weight and diet? Eat a Healthy Diet  Eat plenty of vegetables, fruits, whole grains, low-fat dairy products, and lean protein.  Do not eat a lot of foods high in solid fats, added sugars, or salt.  Maintain a Healthy Weight Regular exercise can help you achieve or maintain a healthy weight. You should:  Do at least 150 minutes of exercise  each week. The exercise should increase your heart rate and make you sweat (moderate-intensity exercise).  Do strength-training exercises at least twice a week.  Watch Your Levels of Cholesterol and Blood Lipids  Have your blood tested for lipids and cholesterol every 5 years starting at 72 years of age. If you are at high risk for heart disease, you should start having your blood tested when you are 72 years old. You may need to have your cholesterol levels checked more often if: ? Your lipid or cholesterol levels are high. ? You are older than 72 years of age. ? You are at high risk for heart disease.  What should I know about cancer screening? Many types of cancers can be detected early and may often be prevented. Lung  Cancer  You should be screened every year for lung cancer if: ? You are a current smoker who has smoked for at least 30 years. ? You are a former smoker who has quit within the past 15 years.  Talk to your health care provider about your screening options, when you should start screening, and how often you should be screened.  Colorectal Cancer  Routine colorectal cancer screening usually begins at 72 years of age and should be repeated every 5-10 years until you are 72 years old. You may need to be screened more often if early forms of precancerous polyps or small growths are found. Your health care provider may recommend screening at an earlier age if you have risk factors for colon cancer.  Your health care provider may recommend using home test kits to check for hidden blood in the stool.  A small camera at the end of a tube can be used to examine your colon (sigmoidoscopy or colonoscopy). This checks for the earliest forms of colorectal cancer.  Prostate and Testicular Cancer  Depending on your age and overall health, your health care provider may do certain tests to screen for prostate and testicular cancer.  Talk to your health care provider about any symptoms or concerns you have about testicular or prostate cancer.  Skin Cancer  Check your skin from head to toe regularly.  Tell your health care provider about any new moles or changes in moles, especially if: ? There is a change in a mole's size, shape, or color. ? You have a mole that is larger than a pencil eraser.  Always use sunscreen. Apply sunscreen liberally and repeat throughout the day.  Protect yourself by wearing long sleeves, pants, a wide-brimmed hat, and sunglasses when outside.  What should I know about heart disease, diabetes, and high blood pressure?  If you are 72-57 years of age, have your blood pressure checked every 3-5 years. If you are 72 years of age or older, have your blood pressure checked every  year. You should have your blood pressure measured twice-once when you are at a hospital or clinic, and once when you are not at a hospital or clinic. Record the average of the two measurements. To check your blood pressure when you are not at a hospital or clinic, you can use: ? An automated blood pressure machine at a pharmacy. ? A home blood pressure monitor.  Talk to your health care provider about your target blood pressure.  If you are between 53-54 years old, ask your health care provider if you should take aspirin to prevent heart disease.  Have regular diabetes screenings by checking your fasting blood sugar level. ? If you are at  a normal weight and have a low risk for diabetes, have this test once every three years after the age of 27. ? If you are overweight and have a high risk for diabetes, consider being tested at a younger age or more often.  A one-time screening for abdominal aortic aneurysm (AAA) by ultrasound is recommended for men aged 60-75 years who are current or former smokers. What should I know about preventing infection? Hepatitis B If you have a higher risk for hepatitis B, you should be screened for this virus. Talk with your health care provider to find out if you are at risk for hepatitis B infection. Hepatitis C Blood testing is recommended for:  Everyone born from [birth year] through 1965.  Anyone with known risk factors for hepatitis C.  Sexually Transmitted Diseases (STDs)  You should be screened each year for STDs including gonorrhea and chlamydia if: ? You are sexually active and are younger than 72 years of age. ? You are older than 72 years of age and your health care provider tells you that you are at risk for this type of infection. ? Your sexual activity has changed since you were last screened and you are at an increased risk for chlamydia or gonorrhea. Ask your health care provider if you are at risk.  Talk with your health care provider about  whether you are at high risk of being infected with HIV. Your health care provider may recommend a prescription medicine to help prevent HIV infection.  What else can I do?  Schedule regular health, dental, and eye exams.  Stay current with your vaccines (immunizations).  Do not use any tobacco products, such as cigarettes, chewing tobacco, and e-cigarettes. If you need help quitting, ask your health care provider.  Limit alcohol intake to no more than 2 drinks per day. One drink equals 12 ounces of beer, 5 ounces of wine, or 1 ounces of hard liquor.  Do not use street drugs.  Do not share needles.  Ask your health care provider for help if you need support or information about quitting drugs.  Tell your health care provider if you often feel depressed.  Tell your health care provider if you have ever been abused or do not feel safe at home. This information is not intended to replace advice given to you by your health care provider. Make sure you discuss any questions you have with your health care provider. Document Released: 07/24/2007 Document Revised: 09/24/2015 Document Reviewed: 10/29/2014 Elsevier Interactive Patient Education  2018 [employer].     [employer] Information Description of Services Cost  A Matter of Balance Class locations vary. Call [place] on Aging for more information.  http://dawson-may.com/ [phone] 8-Session program addressing the fear of falling and increasing activity levels of older adults Free to minimal cost  A.C.T. By [employer] [address] [place], [place] [address].  BetaBlues.dk [phone]  Personal training, gym, classes including Silver Sneakers* and ACTion for Aging Adults Fee-based  A.H.O.Y. (Add Health to [place]) Airs on Time [employer] 13, M-F at [place]: [employer],  [employer], [address] [place],  [place], [place] [facility], [address] [facility], [place], [place], [place]  [place]  [place] Location: Sharrell Ku. [employer] [place]      [phone]  [phone]  [phone]  [phone]  [phone]  [phone]  [phone]  [phone]  [phone]  [phone]    [phone] A total-body conditioning class for adults 38 and older; designed to increase muscular strength, endurance, range of movement, flexibility, balance, agility and coordination Free  [facility] [place], [place] [address] [place]      [address]      [phone]      Pilate's class for individualsreturning to exercise after an injury, before or after surgery or for individuals with complex musculoskeletal issues; designed to improve strength, balance , flexibility      $15/class  [place] [address] [place], [place] [address] [email or web address] [place] classes for beginners to advanced [place], [place] [address] [email or web address] Lykens Chair Exercises Free, ages 71 and older; Ages 48-59 fee based  Marvia Pickles, [facility] [address] [place], [place] [address] Seniorcenter_1 .Beverlee Nims [phone]  A.H.O.Y. Tai Chi Fee-based Donation based or free  [place] Class locations vary.  Call or email Angela Burke or view website for more  information. Info_[email or web address] .com GainPain.com.cy.html [phone] Ongoing classes at local YMCAs and gyms Fee-based  Silver Sneakers A.C.T. By [place] [employer]: [place] [phone]  [phone] Classes designed for older adults who want to improve their strength, flexibility, balance and endurance.   Silver sneakers is covered by some insurance plans and includes a fitness center membership at participating locations. Find out more by calling [phone] or visiting [email or web address] Covered by some insurance plans  [facility] [place] [phone] A.H.O.Y., fitness room, personal training, fitness classes for injury prevention, strength, balance, flexibility, water fitness classes Ages 55+: $6 for 6 months; Ages 43-54: $66 for 6 months  Tai Chi for Everybody [facility] [address] [place], [place] [address] Taichiforeverybody_3 .Patsi Sears [phone] Tai Chi classes for beginners to advanced; geared for seniors Donation Based      [employer] ([place] Others Participate in Exercise     Loyal Gambler. Rosana Hoes, PhD, [place] [email or web address] [place]     [phone]     A comprehensive fitness program for adults.  The program paris senior-level undergraduates Kinesiology students with adults who desire to learn how to exercise safely.  Includes a structural exercise class focusing on functional fitnesss     $100/semester in fall and spring; $75 in summer (no trainers)    *Silver Sneakers is covered by some Personal assistant and includes a  Museum/gallery curator at participating locations.  Find out more by calling [phone] or visiting [email or web address]  For additional health and human services resources for senior adults, please contact SeniorLine at [phone] in [place] and [place] at [phone] in all other areas.

## 2017-02-18 ENCOUNTER — Other Ambulatory Visit: Payer: Self-pay

## 2017-02-18 ENCOUNTER — Ambulatory Visit (HOSPITAL_COMMUNITY): Payer: Medicare Other | Attending: Cardiology

## 2017-02-18 DIAGNOSIS — I350 Nonrheumatic aortic (valve) stenosis: Secondary | ICD-10-CM | POA: Diagnosis not present

## 2017-02-18 DIAGNOSIS — I10 Essential (primary) hypertension: Secondary | ICD-10-CM | POA: Insufficient documentation

## 2017-02-18 DIAGNOSIS — E119 Type 2 diabetes mellitus without complications: Secondary | ICD-10-CM | POA: Diagnosis not present

## 2017-02-18 DIAGNOSIS — E785 Hyperlipidemia, unspecified: Secondary | ICD-10-CM | POA: Diagnosis not present

## 2017-02-23 ENCOUNTER — Encounter: Payer: Self-pay | Admitting: Internal Medicine

## 2017-02-23 ENCOUNTER — Ambulatory Visit (INDEPENDENT_AMBULATORY_CARE_PROVIDER_SITE_OTHER): Payer: Medicare Other | Admitting: Internal Medicine

## 2017-02-23 VITALS — BP 142/86 | HR 80 | Ht 67.0 in | Wt 173.0 lb

## 2017-02-23 DIAGNOSIS — I10 Essential (primary) hypertension: Secondary | ICD-10-CM | POA: Diagnosis not present

## 2017-02-23 DIAGNOSIS — E119 Type 2 diabetes mellitus without complications: Secondary | ICD-10-CM | POA: Diagnosis not present

## 2017-02-23 DIAGNOSIS — I350 Nonrheumatic aortic (valve) stenosis: Secondary | ICD-10-CM | POA: Diagnosis not present

## 2017-02-23 DIAGNOSIS — E782 Mixed hyperlipidemia: Secondary | ICD-10-CM

## 2017-02-23 NOTE — Progress Notes (Signed)
OFFICE NOTE  Chief Complaint:  No complaints  Primary Care Physician: [name], [name], MD  HPI:  Daryl Webb is a 72 y.o. male followed by Dr. Mare Ferrari in the past for hypertension, dyslipidemia, type 2 diabetes and mild aortic sclerosis. His last echocardiogram was in 2015 which showed normal LV function and mild aortic sclerosis/borderline stenosis. Recent lab work was performed by myself indicating hemoglobin A1c of 6.7, creatinine 1.4 however he had a recent UTI, and cholesterol profile which was favorable with LDL 70 and total cholesterol 123. He is on atorvastatin 40 mg daily. He has no history of known coronary artery disease.  02/12/2016  Daryl Webb returns today for follow-up. He recently established a new primary care provider with Dr. Betty [name]. She performs some lab work including cholesterol which appears to be well-controlled with total cholesterol 113, LDL 58, triglycerides 130 and HDL of 29, which is increased from 24 in May 2017. He denies any new symptoms of chest pain or worsening shortness of breath. He does have a early peaking systolic murmur suggestive of mild aortic stenosis. This was last seen on echo in 2015. He is also describing pain in the left flank which radiates into the left groin. He was treated for possible UTI/prostatitis and had improvement in some urinary symptoms but has persistent pain. A CT scan was performed recently, which was essentially unremarkable. He reports his pain is worse when rising from either sitting or lying position up to standing and with walking. This could suggest some compression perhaps of the thoracic or lumbar spine or even symptoms suggestive of a possible hernia.   08/18/2016  Daryl Webb is seen today in follow-up. Overall he seems to be doing pretty well. He had an echocardiogram in January 2018 which now shows moderate aortic stenosis. This is worsened since his prior study in 2015. He has type 2 diabetes and  A1c is 6.7. Blood pressure is well-controlled today 122/60. Recent lipid profile showed an LDL-C of 58, which is at goal. He's asymptomatic, denies any chest pain or worsening shortness of breath. He does do some walking although says he can exercise more frequently.  02/23/2017  Daryl Webb was seen today in follow-up.  He had a recent echocardiogram which showed mild worsening of his aortic stenosis.  His mean gradient went from 22-24 mmHg.  This is still moderate left ear.  He has generally good blood pressure control.  Initially blood pressure is elevated today 142/86 however came down to 124/78.  His diabetes has been fairly well controlled.  He is followed by Dr. Buddy Duty for this.  Recent lipid profile showed an LDL-C of 58 on high intensity atorvastatin.  PMHx:  Past Medical History:  Diagnosis Date  . Anemia   . Arthritis   . Constipation due to pain medication    needs stool softner while on pain medication  . Diabetes mellitus   . Duodenal ulcer 08/13/2015   2014  - Dr Kaplan/GI  . H/O: osteoarthritis   . History of prostate cancer   . History of urethral stricture   . Hypercholesterolemia   . Hypertension   . Ulcer duodenal hemorrhage     Past Surgical History:  Procedure Laterality Date  . ARTHROPLASTY  2004   right knee Dr. Wynelle Link  . CARPAL TUNNEL RELEASE     right hand,wrist [name]  . CYSTOSCOPY N/A 09/20/2012   Procedure: CYSTOSCOPY;  Surgeon: [name], MD;  Location: [facility];  Service: Urology;  Laterality: N/A;  . JOINT REPLACEMENT Right   . KNEE ARTHROSCOPY Right   . PROSTATECTOMY     radical  . VASECTOMY  1985    FAMHx:  Family History  Problem Relation Age of Onset  . Heart disease Mother   . Heart failure Mother   . Heart disease Father   . Diabetes Father     SOCHx:   reports that he quit smoking about 37 years ago. His smoking use included cigarettes. He has a 30.00 pack-year smoking history. he has never used smokeless tobacco. He  reports that he does not drink alcohol or use drugs.  ALLERGIES:  No Known Allergies  ROS: Pertinent items noted in HPI and remainder of comprehensive ROS otherwise negative.  HOME MEDS: Current Outpatient Medications  Medication Sig Dispense Refill  . amLODipine (NORVASC) 5 MG tablet TAKE 1 TABLET DAILY 90 tablet 3  . aspirin 81 MG tablet Take 81 mg by mouth daily.    Marland Kitchen atorvastatin (LIPITOR) 40 MG tablet TAKE 1 TABLET DAILY 90 tablet 3  . Cholecalciferol 2000 UNITS CAPS Take 1 capsule by mouth daily. VITAMIN D 3    . Coenzyme Q10 (COQ10) 100 MG CAPS Take 100 mg by mouth daily.    . cyanocobalamin (,VITAMIN B-12,) 1000 MCG/ML injection Inject into the muscle once a week.    . diclofenac (VOLTAREN) 75 MG EC tablet TAKE 1 TABLET DAILY 90 tablet 1  . Exenatide ER (BYDUREON) 2 MG PEN Inject 1 Dose into the skin once a week.    . loratadine (CLARITIN) 10 MG tablet Take 5 mg by mouth daily. As needed for allergies    . losartan-hydrochlorothiazide (HYZAAR) 50-12.5 MG tablet Take 1 tablet by mouth daily. 90 tablet 3  . metFORMIN (GLUCOPHAGE) 1000 MG tablet Take 1 tablet (1,000 mg total) by mouth 2 (two) times daily with a meal. 180 tablet 3  . metoprolol succinate (TOPROL-XL) 50 MG 24 hr tablet Take 1 tablet (50 mg total) by mouth daily. Take with or immediately following a meal. 90 tablet 3  . Omega-3 Fatty Acids (FISH OIL) 1000 MG CAPS Take 1,000 mg by mouth daily.    Marland Kitchen omeprazole (PRILOSEC) 40 MG capsule TAKE 1 CAPSULE (40 MG TOTAL) BY MOUTH EVERY OTHER DAY. 45 capsule 3  . Zoster Vaccine Adjuvanted [facility]) injection 0.5 ml in muscle and repeat in 8 weeks 0.5 mL 1   No current facility-administered medications for this visit.     LABS/IMAGING: No results found for this or any previous visit (from the past 48 hour(s)). No results found.  WEIGHTS: Wt Readings from Last 3 Encounters:  02/23/17 173 lb (78.5 kg)  02/15/17 172 lb (78 kg)  01/28/17 170 lb 8 oz (77.3 kg)     VITALS: BP (!) 142/86   Pulse 80   Ht 5\' 7"  (1.702 m)   Wt 173 lb (78.5 kg)   BMI 27.10 kg/m   EXAM: General appearance: alert and no distress Neck: no carotid bruit, no JVD and thyroid not enlarged, symmetric, no tenderness/mass/nodules Lungs: clear to auscultation bilaterally Heart: regular rate and rhythm, S1, S2 normal and systolic murmur: Midsystolic 3/6, crescendo at 2nd right intercostal space Abdomen: soft, non-tender; bowel sounds normal; no masses,  no organomegaly Extremities: extremities normal, atraumatic, no cyanosis or edema Pulses: 2+ and symmetric Skin: Skin color, texture, turgor normal. No rashes or lesions Neurologic: Grossly normal Psych: Pleasant  EKG: Sinus rhythm at 80, nonspecific ST and T wave  changes-personally reviewed  ASSESSMENT: 1. Hypertension-at goal 2. Dyslipidemia-at goal 3. Diabetes type 2-A1c 6.7, followed by Dr. Buddy Duty 4. Moderate aortic stenosis (02/2017)  PLAN: 1.   Daryl Webb slight worsening of his aortic valve stenosis with a mean gradient now at 24 mmHg.  He is asymptomatic.  Blood pressure is at goal.  Cholesterol is well controlled.  His diabetes is been well controlled as well.  I encouraged him to continue to work on diet and exercise.  He should monitor himself for symptoms.  Plan follow-up with me annually or sooner as necessary.  We will repeat an echo at that time.  [name], MD, [facility], [place] Director of the [facility] Lipid [facility]  Cardiovascular [facility] Reduction [facility] Diplomate of the American Board of Clinical Lipidology Attending Cardiologist  Direct Dial: [phone]  Fax: [phone]  Website:  [email or web address] 02/23/2017, 9:29 AM

## 2017-02-23 NOTE — Patient Instructions (Addendum)
Your physician has requested that you have an echocardiogram in [place] prior to next appointment - schedule @ [address] - 3rd Floor. Echocardiography is a painless test that uses sound waves to create images of your heart. It provides your doctor with information about the size and shape of your heart and how well your heart's chambers and valves are working. This procedure takes approximately one hour. There are no restrictions for this procedure.  Your physician wants you to follow-up in: ONE YEAR with [name] (after echo). You will receive a reminder letter in the mail two months in advance. If you don't receive a letter, please call our office to schedule the follow-up appointment.

## 2017-02-24 DIAGNOSIS — C44619 Basal cell carcinoma of skin of left upper limb, including shoulder: Secondary | ICD-10-CM | POA: Diagnosis not present

## 2017-02-24 DIAGNOSIS — Z85828 Personal history of other malignant neoplasm of skin: Secondary | ICD-10-CM | POA: Diagnosis not present

## 2017-03-01 DIAGNOSIS — E538 Deficiency of other specified B group vitamins: Secondary | ICD-10-CM | POA: Diagnosis not present

## 2017-03-02 DIAGNOSIS — R825 Elevated urine levels of drugs, medicaments and biological substances: Secondary | ICD-10-CM | POA: Diagnosis not present

## 2017-03-02 DIAGNOSIS — Z8546 Personal history of malignant neoplasm of prostate: Secondary | ICD-10-CM | POA: Diagnosis not present

## 2017-03-02 DIAGNOSIS — N35012 Post-traumatic membranous urethral stricture: Secondary | ICD-10-CM | POA: Diagnosis not present

## 2017-03-11 ENCOUNTER — Other Ambulatory Visit: Payer: Self-pay | Admitting: Internal Medicine

## 2017-03-11 NOTE — Telephone Encounter (Signed)
REFILL 

## 2017-04-01 ENCOUNTER — Other Ambulatory Visit: Payer: Self-pay | Admitting: Internal Medicine

## 2017-04-01 NOTE — Telephone Encounter (Signed)
REFILL 

## 2017-04-06 DIAGNOSIS — E538 Deficiency of other specified B group vitamins: Secondary | ICD-10-CM | POA: Diagnosis not present

## 2017-04-13 ENCOUNTER — Telehealth: Payer: Self-pay | Admitting: Family Medicine

## 2017-04-13 NOTE — Telephone Encounter (Signed)
He is due for FLP and BMP, both I was planning to do here in the office after next F/U appt.  Thanks, BJ

## 2017-04-13 NOTE — Telephone Encounter (Signed)
Copied from [place] [id number]. Topic: Quick Communication - See Telephone Encounter >> Apr 13, 2017 11:16 AM Synthia Innocent wrote: CRM for notification. See Telephone encounter for:  Patient seeing Dr Buddy Duty 05/17/17(endocrinologist), patient would like for Dr [name] to contact Dr Buddy Duty and which lab work she would like to have done. Please advise 04/13/17.

## 2017-04-15 NOTE — Telephone Encounter (Signed)
If he wants labs can be done through his endocrinologist. I do not think he needs a lab order fax , if he does we can fax order on prescription pad. Thanks, BJ

## 2017-04-15 NOTE — Telephone Encounter (Signed)
Spoke with patient, informed him of labs that Dr. [name] said that she would draw at his next [place] in May. Patient verbalized understanding, but also stated that at his last OV he discussed with Dr. [name] that endo would be doing some labs and that she could just add whatever she wanted so that he didn't have to get labs done twice.

## 2017-04-22 NOTE — Telephone Encounter (Signed)
Patient informed of instructions per Dr. [name]. Patient verbalized understanding.

## 2017-05-17 DIAGNOSIS — Z7984 Long term (current) use of oral hypoglycemic drugs: Secondary | ICD-10-CM | POA: Diagnosis not present

## 2017-05-17 DIAGNOSIS — Z5181 Encounter for therapeutic drug level monitoring: Secondary | ICD-10-CM | POA: Diagnosis not present

## 2017-05-17 DIAGNOSIS — E119 Type 2 diabetes mellitus without complications: Secondary | ICD-10-CM | POA: Diagnosis not present

## 2017-05-17 DIAGNOSIS — E538 Deficiency of other specified B group vitamins: Secondary | ICD-10-CM | POA: Diagnosis not present

## 2017-05-17 DIAGNOSIS — R809 Proteinuria, unspecified: Secondary | ICD-10-CM | POA: Diagnosis not present

## 2017-06-01 ENCOUNTER — Telehealth: Payer: Self-pay | Admitting: *Deleted

## 2017-06-01 ENCOUNTER — Other Ambulatory Visit: Payer: Self-pay | Admitting: *Deleted

## 2017-06-01 NOTE — Telephone Encounter (Signed)
REQUESTING REFILL FOR DICLOFEN SODIUM TAB 75 MG, TAKE 1 TAB DAILY, #90

## 2017-06-07 ENCOUNTER — Telehealth: Payer: Self-pay | Admitting: Family Medicine

## 2017-06-07 ENCOUNTER — Other Ambulatory Visit: Payer: Self-pay | Admitting: *Deleted

## 2017-06-07 MED ORDER — DICLOFENAC SODIUM 75 MG PO TBEC: 75.0000 mg | DELAYED_RELEASE_TABLET | Freq: Every day | ORAL | 1 refills | Status: DC

## 2017-06-07 NOTE — Telephone Encounter (Signed)
Rx refilled per protocol. LOV: 01/28/17- next OV- 06/28/17

## 2017-06-07 NOTE — Telephone Encounter (Signed)
Copied from [place] [id number]. Topic: Quick Communication - Rx Refill/Question >> Jun 07, 2017  9:42 AM Selinda Flavin B, NT wrote: Medication: diclofenac (VOLTAREN) 75 MG EC tablet Has the patient contacted their pharmacy? Yes.   (Agent: If no, request that the patient contact the pharmacy for the refill.) Preferred Pharmacy (with phone number or street name): [facility] [place] [place]: Please be advised that RX refills may take up to 3 business days. We ask that you follow-up with your pharmacy.

## 2017-06-13 NOTE — Telephone Encounter (Signed)
It seems like Rx for Diclofenac was already set on 06/07/17.  Akima Slaugh [name], MD

## 2017-06-15 ENCOUNTER — Other Ambulatory Visit: Payer: Self-pay | Admitting: *Deleted

## 2017-06-15 MED ORDER — DICLOFENAC SODIUM 75 MG PO TBEC: 75.0000 mg | DELAYED_RELEASE_TABLET | Freq: Every day | ORAL | 1 refills | Status: DC

## 2017-06-15 NOTE — Telephone Encounter (Signed)
[facility]

## 2017-06-15 NOTE — Telephone Encounter (Signed)
Pt called in to for status update on RX. He was frustrated stating he has been waiting for a long time. Advised pt that [facility] [address] [facility]. Pt states that he has been waiting 3 weeks for his medication. He felt [facility] [address] would have notified him if RX was received. I advised pt I would call them to check. I talked with the pharmacy and they do have RX filled and ready for pick up #90 diclofenac 75mg  for $23.49. Pt states it is less expensive from the mail order pharmacy and would like [place] to cancel the order at [place] and [facility].

## 2017-06-15 NOTE — Telephone Encounter (Signed)
Refill of Voltaren sent to [facility] [address]. Pt requesting Voltaren RX be sent to mail order as is less expensive. [facility], [place].

## 2017-06-21 ENCOUNTER — Telehealth: Payer: Self-pay | Admitting: Family Medicine

## 2017-06-21 NOTE — Telephone Encounter (Signed)
Spoke with patient and informed him that Rx was sent on 06/15/17 to [place]. Patient asked if I could call [facility] [address] to cancel the one that was sent and I told patient that I would and give him a call back. [facility] was on hold for more than 10 minutes, had to hang up because I had patients waiting in the lobby. [facility] [address] again and after being on hold again for more than 10 minutes, the pharmacists answers, when I tell him what patient and medication, he tells me that the patient had just picked it up. I called patient, he did not answer so I left a message letting him know that I had just got off the phone with the pharmacy and they said he picked up the medication. No further assistance is needed on my part.

## 2017-06-21 NOTE — Telephone Encounter (Signed)
Copied from [place] [id number]. Topic: General - Other >> Jun 21, 2017  2:48 PM Yvette Rack wrote: Reason for CRM: Pt requested to speak with [facility]. Offered to assist but he declined and asked that I have Alyse Low return his call to [phone].

## 2017-06-22 DIAGNOSIS — E538 Deficiency of other specified B group vitamins: Secondary | ICD-10-CM | POA: Diagnosis not present

## 2017-06-27 NOTE — Progress Notes (Signed)
HPI:   Daryl Webb is a 72 y.o. male, who is here today for 6 months follow up.   He was last seen in 01/2017. Since his last visit he has follow with urologist, 03/02/2017. History of urethral strictures and prostate cancer, S/P radical retropubic prostatectomy.  He has also follow with endocrinologist at [facility], Dr. Buddy Duty on 05/17/2017 CMP was done at Dr Cindra Eves office. E GFR 51 (58) Elevated microalb/Cr ratio at 44.25.  Hemoglobin A1c 6.6. B12 low at 165.  Hypertension: Currently he is on Amlodipine 5 mg daily, Metoprolol Succinate 50 mg daily, and Hyzaar 50-12.5 mg daily.  Denies severe/frequent headache, visual changes, chest pain, dyspnea, palpitation, claudication, focal weakness, or edema.  Home BP's: Not checking BP.  He is on NSAIDs, Diclofenac 75 mg EC daily as needed to treat arthritis. + CKD III Hx of duodenal ulcer. Mainly IP arthralgia and occasionally shoulders.   Hyperlipidemia:  Currently on atorvastatin 40 mg daily. Following a low fat diet: Not.consistently, he does not think he eats a lot of fried food.  He has not noted side effects with medication.  Lab Results  Component Value Date   CHOL 113 01/19/2016   HDL 29.50 (L) 01/19/2016   LDLCALC 58 01/19/2016   LDLDIRECT 73.8 11/20/2012   TRIG 130.0 01/19/2016   CHOLHDL 4 01/19/2016     Review of Systems  Constitutional: Negative for activity change, appetite change, fatigue and fever.  HENT: Negative for nosebleeds, sore throat and trouble swallowing.   Eyes: Negative for redness and visual disturbance.  Respiratory: Negative for cough, shortness of breath and wheezing.   Cardiovascular: Negative for chest pain, palpitations and leg swelling.  Gastrointestinal: Negative for abdominal pain, nausea and vomiting.  Genitourinary: Negative for decreased urine volume, dysuria and hematuria.  Musculoskeletal: Positive for arthralgias. Negative for myalgias.  Neurological: Negative  for syncope, weakness and headaches.  Psychiatric/Behavioral: Negative for confusion. The patient is nervous/anxious.       Current Outpatient Medications on File Prior to Visit  Medication Sig Dispense Refill  . amLODipine (NORVASC) 5 MG tablet TAKE 1 TABLET DAILY 90 tablet 3  . amoxicillin (AMOXIL) 500 MG capsule TAKE 4 CAPSULES 1 HOUR PRIOR TO APPT  2  . aspirin 81 MG tablet Take 81 mg by mouth daily.    Marland Kitchen atorvastatin (LIPITOR) 40 MG tablet TAKE 1 TABLET DAILY 90 tablet 3  . Cholecalciferol 2000 UNITS CAPS Take 1 capsule by mouth daily. VITAMIN D 3    . Coenzyme Q10 (COQ10) 100 MG CAPS Take 100 mg by mouth daily.    . cyanocobalamin (,VITAMIN B-12,) 1000 MCG/ML injection Inject into the muscle every 30 (thirty) days.     . Exenatide ER (BYDUREON) 2 MG PEN Inject 1 Dose into the skin once a week.    . loratadine (CLARITIN) 10 MG tablet Take 5 mg by mouth daily. As needed for allergies    . losartan-hydrochlorothiazide (HYZAAR) 50-12.5 MG tablet TAKE 1 TABLET DAILY 90 tablet 3  . metFORMIN (GLUCOPHAGE) 1000 MG tablet Take 1 tablet (1,000 mg total) by mouth 2 (two) times daily with a meal. 180 tablet 3  . metoprolol succinate (TOPROL-XL) 50 MG 24 hr tablet TAKE 1 TABLET DAILY WITH ORIMMEDIATELY FOLLOWING A    MEAL 90 tablet 3  . Omega-3 Fatty Acids (FISH OIL) 1000 MG CAPS Take 1,000 mg by mouth daily.    Marland Kitchen omeprazole (PRILOSEC) 40 MG capsule TAKE 1 CAPSULE (40 MG TOTAL) BY  MOUTH EVERY OTHER DAY. 45 capsule 3  . Zoster Vaccine Adjuvanted [facility]) injection 0.5 ml in muscle and repeat in 8 weeks 0.5 mL 1   No current facility-administered medications on file prior to visit.      Past Medical History:  Diagnosis Date  . Anemia   . Arthritis   . Constipation due to pain medication    needs stool softner while on pain medication  . Diabetes mellitus   . Duodenal ulcer 08/13/2015   2014  - Dr Kaplan/GI  . H/O: osteoarthritis   . History of prostate cancer   . History of urethral  stricture   . Hypercholesterolemia   . Hypertension   . Ulcer duodenal hemorrhage    No Known Allergies  Social History   Socioeconomic History  . Marital status: Married    Spouse name: Not on file  . Number of children: 2  . Years of education: Not on file  . Highest education level: Not on file  Occupational History  . Occupation: Retired  Scientific laboratory technician  . Financial resource strain: Not on file  . Food insecurity:    Worry: Not on file    Inability: Not on file  . Transportation needs:    Medical: Not on file    Non-medical: Not on file  Tobacco Use  . Smoking status: Former Smoker    Packs/day: 1.50    Years: 20.00    Pack years: 30.00    Types: Cigarettes    Last attempt to quit: 02/09/1980    Years since quitting: 37.4  . Smokeless tobacco: Never Used  Substance and Sexual Activity  . Alcohol use: No  . Drug use: No  . Sexual activity: Not on file  Lifestyle  . Physical activity:    Days per week: Not on file    Minutes per session: Not on file  . Stress: Not on file  Relationships  . Social connections:    Talks on phone: Not on file    Gets together: Not on file    Attends religious service: Not on file    Active member of club or organization: Not on file    Attends meetings of clubs or organizations: Not on file    Relationship status: Not on file  Other Topics Concern  . Not on file  Social History Narrative  . Not on file    Vitals:   06/28/17 0843  BP: 132/83  Pulse: 88  Resp: 12  Temp: 97.7 F (36.5 C)  SpO2: 97%   Body mass index is 27.41 kg/m.   Physical Exam  Nursing note and vitals reviewed. Constitutional: He is oriented to person, place, and time. He appears well-developed. No distress.  HENT:  Head: Normocephalic and atraumatic.  Mouth/Throat: Oropharynx is clear and moist and mucous membranes are normal.  Eyes: Pupils are equal, round, and reactive to light. Conjunctivae are normal.  Cardiovascular: Normal rate and  regular rhythm.  Murmur (SEM I-II/VI RUSB) heard. Pulses:      Dorsalis pedis pulses are 2+ on the right side, and 2+ on the left side.  Respiratory: Effort normal and breath sounds normal. No respiratory distress.  GI: Soft. He exhibits no mass. There is no hepatomegaly. There is no tenderness.  Musculoskeletal: He exhibits no edema.  No signs of synovitis.  Lymphadenopathy:    He has no cervical adenopathy.  Neurological: He is alert and oriented to person, place, and time. He has normal strength. Gait normal.  Skin: Skin is warm. No rash noted. No erythema.  Psychiatric: He has a normal mood and affect.  Well groomed, good eye contact.      ASSESSMENT AND PLAN:   Daryl Webb was seen today for 6 months follow-up.  Orders Placed This Encounter  Procedures  . Lipid panel    Generalized osteoarthritis of multiple sites We discussed side effects of NSAIDs.  Given the fact he is renal function has been abnormal and now with proteinuria, recommend stopping oral diclofenac. We will try topical Voltaren gel 4 times daily as needed and OTC Tylenol 650 mg 3 times per day.    Essential hypertension Adequately controlled. No changes in current management. DASH-Low-salt diet recommended. Eye exam is current. F/U in 6 months, before if needed.   Mixed hyperlipidemia No changes on Atorvastatin 40 mg daily. Low-fat diet also recommended. Further recommendations will be given according to lab results. Follow-up in 6 to 12 months.   CKD (chronic kidney disease), stage III (HCC)  We discussed Dx. Avoid oral NSAID's,so Diclofenac discontinued. Low salt diet to continue. Continue Losartan.     -Daryl Webb was advised to return sooner than planned today if new concerns arise.       Daryl Webb G. [name], MD  [facility]. [place] office.

## 2017-06-28 ENCOUNTER — Ambulatory Visit (INDEPENDENT_AMBULATORY_CARE_PROVIDER_SITE_OTHER): Payer: Medicare Other | Admitting: Family Medicine

## 2017-06-28 ENCOUNTER — Encounter: Payer: Self-pay | Admitting: Family Medicine

## 2017-06-28 VITALS — BP 132/83 | HR 88 | Temp 97.7°F | Resp 12 | Ht 67.0 in | Wt 175.0 lb

## 2017-06-28 DIAGNOSIS — M159 Polyosteoarthritis, unspecified: Secondary | ICD-10-CM | POA: Diagnosis not present

## 2017-06-28 DIAGNOSIS — N183 Chronic kidney disease, stage 3 unspecified: Secondary | ICD-10-CM

## 2017-06-28 DIAGNOSIS — E782 Mixed hyperlipidemia: Secondary | ICD-10-CM | POA: Diagnosis not present

## 2017-06-28 DIAGNOSIS — I10 Essential (primary) hypertension: Secondary | ICD-10-CM | POA: Diagnosis not present

## 2017-06-28 LAB — LIPID PANEL
CHOL/HDL RATIO: 3
Cholesterol: 94 mg/dL (ref 0–200)
HDL: 27.9 mg/dL — AB (ref >39.00)
LDL CALC: 43 mg/dL (ref 0–99)
NONHDL: 65.84
Triglycerides: 112 mg/dL (ref 0.0–149.0)
VLDL: 22.4 mg/dL (ref 0.0–40.0)

## 2017-06-28 MED ORDER — DICLOFENAC SODIUM 1 % TD GEL: 4.0000 g | Freq: Four times a day (QID) | TRANSDERMAL | 3 refills | Status: DC

## 2017-06-28 NOTE — Assessment & Plan Note (Signed)
No changes on Atorvastatin 40 mg daily. Low-fat diet also recommended. Further recommendations will be given according to lab results. Follow-up in 6 to 12 months.

## 2017-06-28 NOTE — Patient Instructions (Signed)
A few things to remember from today's visit:   Mixed hyperlipidemia - Plan: Lipid panel  Essential hypertension  H/O: osteoarthritis - Plan: diclofenac sodium (VOLTAREN) 1 % GEL  Generalized osteoarthritis of multiple sites  Oral Diclofenac to discontinue and topical to start.  Tylenol 650 mg 3 times per day also may help.   Please be sure medication list is accurate. If a new problem present, please set up appointment sooner than planned today.

## 2017-06-28 NOTE — Assessment & Plan Note (Signed)
Adequately controlled. No changes in current management. DASH-Low-salt diet recommended. Eye exam is current. F/U in 6 months, before if needed.  

## 2017-06-28 NOTE — Assessment & Plan Note (Addendum)
We discussed side effects of NSAIDs.  Given the fact he is renal function has been abnormal and now with proteinuria, recommend stopping oral diclofenac. We will try topical Voltaren gel 4 times daily as needed and OTC Tylenol 650 mg 3 times per day.

## 2017-06-29 ENCOUNTER — Telehealth: Payer: Self-pay | Admitting: Family Medicine

## 2017-06-29 NOTE — Telephone Encounter (Signed)
Copied from [place] [id number]. Topic: Quick Communication - See Telephone Encounter >> Jun 29, 2017 11:44 AM Synthia Innocent wrote: CRM for notification. See Telephone encounter for: 06/29/17. Is patient suppose to have diclofenac (VOLTAREN) 75 MG EC tablet  and diclofenac sodium (VOLTAREN) 1 % GEL? Please advise Ref # [id number]

## 2017-06-30 NOTE — Telephone Encounter (Signed)
Gayatri with [facility] calling to f/u requesting clarification if pt should be on diclofenac tablet, gel, or both? Please advise.  Ref # [id number]

## 2017-06-30 NOTE — Telephone Encounter (Signed)
Spoke with Raquel Sarna at pharmacy and clarified medication for patient.

## 2017-07-03 DIAGNOSIS — N1832 Chronic kidney disease, stage 3b: Secondary | ICD-10-CM | POA: Insufficient documentation

## 2017-07-03 DIAGNOSIS — N183 Chronic kidney disease, stage 3 unspecified: Secondary | ICD-10-CM | POA: Insufficient documentation

## 2017-07-27 DIAGNOSIS — E538 Deficiency of other specified B group vitamins: Secondary | ICD-10-CM | POA: Diagnosis not present

## 2017-08-17 DIAGNOSIS — L570 Actinic keratosis: Secondary | ICD-10-CM | POA: Diagnosis not present

## 2017-08-17 DIAGNOSIS — L821 Other seborrheic keratosis: Secondary | ICD-10-CM | POA: Diagnosis not present

## 2017-08-17 DIAGNOSIS — C44519 Basal cell carcinoma of skin of other part of trunk: Secondary | ICD-10-CM | POA: Diagnosis not present

## 2017-08-17 DIAGNOSIS — D225 Melanocytic nevi of trunk: Secondary | ICD-10-CM | POA: Diagnosis not present

## 2017-08-17 DIAGNOSIS — D2272 Melanocytic nevi of left lower limb, including hip: Secondary | ICD-10-CM | POA: Diagnosis not present

## 2017-08-17 DIAGNOSIS — Z85828 Personal history of other malignant neoplasm of skin: Secondary | ICD-10-CM | POA: Diagnosis not present

## 2017-11-17 DIAGNOSIS — Z5181 Encounter for therapeutic drug level monitoring: Secondary | ICD-10-CM | POA: Diagnosis not present

## 2017-11-17 DIAGNOSIS — E538 Deficiency of other specified B group vitamins: Secondary | ICD-10-CM | POA: Diagnosis not present

## 2017-11-17 DIAGNOSIS — E119 Type 2 diabetes mellitus without complications: Secondary | ICD-10-CM | POA: Diagnosis not present

## 2017-11-22 DIAGNOSIS — E538 Deficiency of other specified B group vitamins: Secondary | ICD-10-CM | POA: Diagnosis not present

## 2017-11-25 DIAGNOSIS — E119 Type 2 diabetes mellitus without complications: Secondary | ICD-10-CM | POA: Diagnosis not present

## 2017-12-27 DIAGNOSIS — E538 Deficiency of other specified B group vitamins: Secondary | ICD-10-CM | POA: Diagnosis not present

## 2017-12-28 DIAGNOSIS — H2513 Age-related nuclear cataract, bilateral: Secondary | ICD-10-CM | POA: Diagnosis not present

## 2017-12-28 DIAGNOSIS — Z7984 Long term (current) use of oral hypoglycemic drugs: Secondary | ICD-10-CM | POA: Diagnosis not present

## 2017-12-28 DIAGNOSIS — H25013 Cortical age-related cataract, bilateral: Secondary | ICD-10-CM | POA: Diagnosis not present

## 2017-12-28 DIAGNOSIS — E119 Type 2 diabetes mellitus without complications: Secondary | ICD-10-CM | POA: Diagnosis not present

## 2017-12-28 DIAGNOSIS — H524 Presbyopia: Secondary | ICD-10-CM | POA: Diagnosis not present

## 2017-12-28 DIAGNOSIS — H52203 Unspecified astigmatism, bilateral: Secondary | ICD-10-CM | POA: Diagnosis not present

## 2017-12-28 DIAGNOSIS — H5203 Hypermetropia, bilateral: Secondary | ICD-10-CM | POA: Diagnosis not present

## 2017-12-29 NOTE — Progress Notes (Signed)
HPI:   [name] is a 72 y.o. male, who is here today for 6 months follow up.   She was last seen on 06/28/17.  Since his last OV he has followed with Dr Daryl Webb,endocrinologist.   Hypertension:  Currently on Losartan-HCTZ 50-12.5 mg daily and Metoprolol Succinate 50 mg daily.  BP readings: Not checking.  He is taking medications as instructed, no side effects reported.  He has not noted unusual headache, visual changes, exertional chest pain, dyspnea,  focal weakness, or edema.   Lab Results  Component Value Date   CREATININE 1.40 07/19/2016   BUN 23 07/19/2016   NA 140 07/19/2016   K 4.3 07/19/2016   CL 106 07/19/2016   CO2 26 07/19/2016   CKD 3: According to patient, creatinine was elevated, so Dr. Buddy Webb Webb holding metformin. Renal function was recheck, creatinine back to baseline, so metformin was resumed. He denies gross hematuria, foamy urine, or decreased urine output.  Last HgA1C 6.7 in 11/2017.  Generalized OA: He was on diclofenac 75 mg twice daily as needed, which I recommend discontinuing. I Webb trying topical diclofenac. He would like to go back to diclofenac 75 mg, which was helping with arthritis. Pain is exacerbated by yard work. Bilateral shoulders, lower back, and bilateral knee pain. He states that he has been on diclofenac for 20+ years.   Review of Systems  Constitutional: Negative for activity change, appetite change, fatigue and fever.  HENT: Negative for nosebleeds, sore throat and trouble swallowing.   Eyes: Negative for redness and visual disturbance.  Respiratory: Negative for apnea, cough, shortness of breath and wheezing.   Cardiovascular: Negative for chest pain, palpitations and leg swelling.  Gastrointestinal: Negative for abdominal pain, nausea and vomiting.  Genitourinary: Negative for decreased urine volume, dysuria and hematuria.  Musculoskeletal: Positive for arthralgias. Negative for joint  swelling.  Neurological: Negative for syncope, weakness and headaches.  Psychiatric/Behavioral: The patient is nervous/anxious.     Current Outpatient Medications on File Prior to Visit  Medication Sig Dispense Refill  . amLODipine (NORVASC) 5 MG tablet TAKE 1 TABLET DAILY 90 tablet 3  . amoxicillin (AMOXIL) 500 MG capsule TAKE 4 CAPSULES 1 HOUR PRIOR TO APPT  2  . aspirin 81 MG tablet Take 81 mg by mouth daily.    . Aspirin-Calcium Carbonate 81-777 MG TABS Take by mouth.    Marland Kitchen atorvastatin (LIPITOR) 40 MG tablet TAKE 1 TABLET DAILY 90 tablet 3  . Cholecalciferol 2000 UNITS CAPS Take 1 capsule by mouth daily. VITAMIN D 3    . Coenzyme Q10 (COQ10) 100 MG CAPS Take 100 mg by mouth daily.    . cyanocobalamin (,VITAMIN B-12,) 1000 MCG/ML injection Inject into the muscle every 30 (thirty) days.     . Exenatide ER (BYDUREON) 2 MG PEN Inject 1 Dose into the skin once a week.    . loratadine (CLARITIN) 10 MG tablet Take 5 mg by mouth daily. As needed for allergies    . losartan-hydrochlorothiazide (HYZAAR) 50-12.5 MG tablet TAKE 1 TABLET DAILY 90 tablet 3  . metFORMIN (GLUCOPHAGE) 1000 MG tablet Take 1 tablet (1,000 mg total) by mouth 2 (two) times daily with a meal. 180 tablet 3  . metoprolol succinate (TOPROL-XL) 50 MG 24 hr tablet TAKE 1 TABLET DAILY WITH ORIMMEDIATELY FOLLOWING A    MEAL 90 tablet 3  . Omega-3 Fatty Acids (FISH OIL) 1000 MG CAPS Take 1,000 mg by mouth daily.    Marland Kitchen omeprazole (  PRILOSEC) 40 MG capsule TAKE 1 CAPSULE (40 MG TOTAL) BY MOUTH EVERY OTHER DAY. 45 capsule 3   No current facility-administered medications on file prior to visit.      Past Medical History:  Diagnosis Date  . Anemia   . Arthritis   . Constipation due to pain medication    needs stool softner while on pain medication  . Diabetes mellitus   . Duodenal ulcer 08/13/2015   2014  - Dr Kaplan/GI  . H/O: osteoarthritis   . History of prostate cancer   . History of urethral stricture   .  Hypercholesterolemia   . Hypertension   . Ulcer duodenal hemorrhage    No Known Allergies  Social History   Socioeconomic History  . Marital status: Married    Spouse name: Not on file  . Number of children: 2  . Years of education: Not on file  . Highest education level: Not on file  Occupational History  . Occupation: Retired  Scientific laboratory technician  . Financial resource strain: Not on file  . Food insecurity:    Worry: Not on file    Inability: Not on file  . Transportation needs:    Medical: Not on file    Non-medical: Not on file  Tobacco Use  . Smoking status: Former Smoker    Packs/day: 1.50    Years: 20.00    Pack years: 30.00    Types: Cigarettes    Last attempt to quit: 02/09/1980    Years since quitting: 37.9  . Smokeless tobacco: Never Used  Substance and Sexual Activity  . Alcohol use: No  . Drug use: No  . Sexual activity: Not on file  Lifestyle  . Physical activity:    Days per week: Not on file    Minutes per session: Not on file  . Stress: Not on file  Relationships  . Social connections:    Talks on phone: Not on file    Gets together: Not on file    Attends religious service: Not on file    Active member of club or organization: Not on file    Attends meetings of clubs or organizations: Not on file    Relationship status: Not on file  Other Topics Concern  . Not on file  Social History Narrative  . Not on file    Vitals:   12/30/17 0837  BP: 123/82  Pulse: 88  Resp: 12  Temp: 98.3 F (36.8 C)  SpO2: 97%   Body mass index is 26.8 kg/m.   Physical Exam  Nursing note reviewed. Constitutional: He is oriented to person, place, and time. He appears well-developed. No distress.  HENT:  Head: Normocephalic and atraumatic.  Mouth/Throat: Oropharynx is clear and moist and mucous membranes are normal.  Eyes: Pupils are equal, round, and reactive to light. Conjunctivae are normal.  Cardiovascular: Normal rate and regular rhythm.  No murmur  heard. Pulses:      Dorsalis pedis pulses are 2+ on the right side, and 2+ on the left side.  Respiratory: Effort normal and breath sounds normal. No respiratory distress.  GI: Soft. He exhibits no mass. There is no hepatomegaly. There is no tenderness.  Musculoskeletal: He exhibits no edema.  Shoulder range of motion in normal range, it does not elicit pain. Antalgic gait due to knee pain.  Lymphadenopathy:    He has no cervical adenopathy.  Neurological: He is alert and oriented to person, place, and time. He has normal strength.  No cranial nerve deficit. Gait normal.  Skin: Skin is warm. No rash noted. No erythema.  Psychiatric: He has a normal mood and affect. Cognition and memory are normal.  Well groomed, good eye contact.      ASSESSMENT AND PLAN:   [name] was seen today for 6 months follow-up.  No orders of the defined types were placed in this encounter.   Essential hypertension BP adequately controlled. No changes in current management. Eye exam is current. Continue low-salt diet. Follow-up in 4 months  Generalized osteoarthritis of multiple sites He insist in going back to oral diclofenac 75 mg. I clearly provided education about side effects of chronic use of NSAIDs, given the fact he has CVD risk. Diclofenac 75 mg once daily as needed for pain. Discontinue topical Voltaren. Follow-up in 4 months.  CKD (chronic kidney disease), stage III ([place]) Problem has been otherwise stable. Side effects of NSAIDs discussed. We will continue monitoring renal function periodically. Low-salt diet to continue. No changes in losartan 50 mg daily. Adequate BP and DM 2 control. Adequate hydration. Further recommendation will be given according to lab results.  Type 2 diabetes mellitus without complication, without long-term current use of insulin (HCC) Well-controlled. Continue metformin, some side effect discussed. He will continue following with Dr.  Kerr       Mery Guadalupe G. [name], MD  [facility]. [place] office.

## 2017-12-30 ENCOUNTER — Encounter: Payer: Self-pay | Admitting: Family Medicine

## 2017-12-30 ENCOUNTER — Ambulatory Visit (INDEPENDENT_AMBULATORY_CARE_PROVIDER_SITE_OTHER): Payer: Medicare Other | Admitting: Family Medicine

## 2017-12-30 VITALS — BP 123/82 | HR 88 | Temp 98.3°F | Resp 12 | Ht 67.0 in | Wt 171.1 lb

## 2017-12-30 DIAGNOSIS — M159 Polyosteoarthritis, unspecified: Secondary | ICD-10-CM | POA: Diagnosis not present

## 2017-12-30 DIAGNOSIS — E119 Type 2 diabetes mellitus without complications: Secondary | ICD-10-CM

## 2017-12-30 DIAGNOSIS — N183 Chronic kidney disease, stage 3 unspecified: Secondary | ICD-10-CM

## 2017-12-30 DIAGNOSIS — I10 Essential (primary) hypertension: Secondary | ICD-10-CM

## 2017-12-30 MED ORDER — ZOSTER VAC RECOMB ADJUVANTED 50 MCG/0.5ML IM SUSR: INTRAMUSCULAR | 1 refills | Status: DC

## 2017-12-30 MED ORDER — DICLOFENAC SODIUM 75 MG PO TBEC: 75.0000 mg | DELAYED_RELEASE_TABLET | Freq: Every day | ORAL | 2 refills | Status: DC | PRN

## 2017-12-30 NOTE — Assessment & Plan Note (Signed)
Well-controlled. Continue metformin, some side effect discussed. He will continue following with Dr. Buddy Duty

## 2017-12-30 NOTE — Assessment & Plan Note (Signed)
He insist in going back to oral diclofenac 75 mg. I clearly provided education about side effects of chronic use of NSAIDs, given the fact he has CVD risk. Diclofenac 75 mg once daily as needed for pain. Discontinue topical Voltaren. Follow-up in 4 months.

## 2017-12-30 NOTE — Assessment & Plan Note (Signed)
BP adequately controlled. No changes in current management. Eye exam is current. Continue low-salt diet. Follow-up in 4 months

## 2017-12-30 NOTE — Assessment & Plan Note (Signed)
Problem has been otherwise stable. Side effects of NSAIDs discussed. We will continue monitoring renal function periodically. Low-salt diet to continue. No changes in losartan 50 mg daily. Adequate BP and DM 2 control. Adequate hydration. Further recommendation will be given according to lab results.

## 2017-12-30 NOTE — Patient Instructions (Signed)
A few things to remember from today's visit:   CKD (chronic kidney disease), stage III ([place])  Benign hypertensive heart disease without heart failure  Generalized osteoarthritis of multiple sites - Plan: diclofenac (VOLTAREN) 75 MG EC tablet  Essential hypertension  To preserve kidney function and/or slow progression of disease:  Low salt diet and adequate hydration. Avoiding medicines known as "nonsteroidal anti-inflammatory drugs," or NSAIDs. These medicines include ibuprofen (sample brand names: Advil, Motrin) and naproxen (sample brand name: Aleve).  Adequate blood pressure control. Low phosphorus diet.    Please be sure medication list is accurate. If a new problem present, please set up appointment sooner than planned today.

## 2018-01-30 DIAGNOSIS — E538 Deficiency of other specified B group vitamins: Secondary | ICD-10-CM | POA: Diagnosis not present

## 2018-02-05 ENCOUNTER — Other Ambulatory Visit: Payer: Self-pay | Admitting: Internal Medicine

## 2018-02-06 NOTE — Telephone Encounter (Signed)
Rx request sent to pharmacy.  

## 2018-02-15 ENCOUNTER — Other Ambulatory Visit: Payer: Self-pay

## 2018-02-15 ENCOUNTER — Ambulatory Visit (HOSPITAL_COMMUNITY): Payer: Medicare Other | Attending: Cardiology

## 2018-02-15 DIAGNOSIS — I350 Nonrheumatic aortic (valve) stenosis: Secondary | ICD-10-CM | POA: Insufficient documentation

## 2018-02-16 NOTE — Progress Notes (Signed)
Subjective:   Daryl Webb is a 73 y.o. male who presents for Medicare Annual/Subsequent preventive examination.  Review of Systems:  No ROS.  Medicare Wellness Visit. Additional risk factors are reflected in the social history.  Cardiac Risk Factors include: advanced age (>53men, >57 women);diabetes mellitus;dyslipidemia;male gender Sleep patterns: feels rested on waking. Home Safety/Smoke Alarms: Feels safe in home. Smoke alarms in place.  Living environment; residence and Firearm Safety: [place], firearms stored safely. Seat Belt Safety/Bike Helmet: Wears seat belt.    Male:   CCS- 2012, due 2022    PSA- No results found for: PSA      Objective:    Vitals: BP 122/68 (BP Location: Right Arm, Patient Position: Sitting, Cuff Size: Normal)   Pulse 82   Resp 16   Ht 5\' 7"  (1.702 m)   Wt 173 lb (78.5 kg)   SpO2 96%   BMI 27.10 kg/m   Body mass index is 27.1 kg/m.  Advanced Directives 02/20/2018 02/15/2017 04/17/2014 09/12/2012  Does Patient Have a Medical Advance Directive? Yes Yes Yes Patient has advance directive, copy in chart  Type of Advance Directive [place];Living will - [place];Living will [place];Living will  Does patient want to make changes to medical advance directive? No - Patient declined - - -  Copy of [place] in Chart? No - copy requested - Yes -    Tobacco Social History   Tobacco Use  Smoking Status Former Smoker  . Packs/day: 1.50  . Years: 20.00  . Pack years: 30.00  . Types: Cigarettes  . Last attempt to quit: 02/09/1980  . Years since quitting: 38.0  Smokeless Tobacco Never Used     Counseling given: Not Answered   Past Medical History:  Diagnosis Date  . Anemia   . Arthritis   . Constipation due to pain medication    needs stool softner while on pain medication  . Diabetes mellitus   . Duodenal ulcer 08/13/2015   2014  - Dr Kaplan/GI  . H/O:  osteoarthritis   . History of prostate cancer   . History of urethral stricture   . Hypercholesterolemia   . Hypertension   . Ulcer duodenal hemorrhage    Past Surgical History:  Procedure Laterality Date  . ARTHROPLASTY  2004   right knee Dr. Wynelle Link  . CARPAL TUNNEL RELEASE     right hand,wrist [name]  . CYSTOSCOPY N/A 09/20/2012   Procedure: CYSTOSCOPY;  Surgeon: [name], MD;  Location: [facility];  Service: Urology;  Laterality: N/A;  . JOINT REPLACEMENT Right   . KNEE ARTHROSCOPY Right   . LUMBAR LAMINECTOMY  02/08/2013  . PROSTATECTOMY     radical  . VASECTOMY  1985   Family History  Problem Relation Age of Onset  . Heart disease Mother   . Heart failure Mother   . Heart disease Father   . Diabetes Father    Social History   Socioeconomic History  . Marital status: Married    Spouse name: Not on file  . Number of children: 2  . Years of education: Not on file  . Highest education level: Not on file  Occupational History  . Occupation: Retired  Scientific laboratory technician  . Financial resource strain: Not hard at all  . Food insecurity:    Worry: Never true    Inability: Never true  . Transportation needs:    Medical: No  Non-medical: No  Tobacco Use  . Smoking status: Former Smoker    Packs/day: 1.50    Years: 20.00    Pack years: 30.00    Types: Cigarettes    Last attempt to quit: 02/09/1980    Years since quitting: 38.0  . Smokeless tobacco: Never Used  Substance and Sexual Activity  . Alcohol use: No  . Drug use: No  . Sexual activity: Not Currently  Lifestyle  . Physical activity:    Days per week: 0 days    Minutes per session: 0 min  . Stress: Not at all  Relationships  . Social connections:    Talks on phone: Twice a week    Gets together: Twice a week    Attends religious service: Never    Active member of club or organization: No    Attends meetings of clubs or organizations: Never    Relationship status: Married  Other Topics Concern    . 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 Lives with wife, two children in [place]. Enjoys reading, boating, travelling to [place] in the winter with wife.    Outpatient Encounter Medications as of 02/20/2018  Medication Sig  . amLODipine (NORVASC) 5 MG tablet TAKE 1 TABLET DAILY  . amoxicillin (AMOXIL) 500 MG capsule TAKE 4 CAPSULES 1 HOUR PRIOR TO APPT  . aspirin 81 MG tablet Take 81 mg by mouth daily.  . Aspirin-Calcium Carbonate 81-777 MG TABS Take by mouth.  Marland Kitchen atorvastatin (LIPITOR) 40 MG tablet TAKE 1 TABLET DAILY  . Cholecalciferol 2000 UNITS CAPS Take 1 capsule by mouth daily. VITAMIN D 3  . cyanocobalamin (,VITAMIN B-12,) 1000 MCG/ML injection Inject into the muscle every 30 (thirty) days.   . diclofenac (VOLTAREN) 75 MG EC tablet Take 1 tablet (75 mg total) by mouth daily as needed.  . Exenatide ER (BYDUREON) 2 MG PEN Inject 1 Dose into the skin once a week.  . loratadine (CLARITIN) 10 MG tablet Take 5 mg by mouth daily. As needed for allergies  . losartan-hydrochlorothiazide (HYZAAR) 50-12.5 MG tablet TAKE 1 TABLET DAILY  . metFORMIN (GLUCOPHAGE) 1000 MG tablet Take 1 tablet (1,000 mg total) by mouth 2 (two) times daily with a meal.  . metoprolol succinate (TOPROL-XL) 50 MG 24 hr tablet TAKE 1 TABLET DAILY WITH ORIMMEDIATELY FOLLOWING A    MEAL  . Omega-3 Fatty Acids (FISH OIL) 1000 MG CAPS Take 1,000 mg by mouth daily.  Marland Kitchen Zoster Vaccine Adjuvanted [facility]) injection 0.5 ml in muscle and repeat in 8 weeks  . Coenzyme Q10 (COQ10) 100 MG CAPS Take 100 mg by mouth daily.  . [DISCONTINUED] omeprazole (PRILOSEC) 40 MG capsule TAKE 1 CAPSULE (40 MG TOTAL) BY MOUTH EVERY OTHER DAY. (Patient not taking: Reported on 02/20/2018)   No facility-administered encounter medications on file as of 02/20/2018.     Activities of Daily Living In your present state of health, do you have any difficulty performing the following activities: 02/20/2018  Hearing?  N  Vision? N  Difficulty concentrating or making decisions? Y  Comment MMSE completed  Walking or climbing stairs? N  Dressing or bathing? N  Doing errands, shopping? N  Preparing Food and eating ? N  Using the Toilet? N  In the past six months, have you accidently leaked urine? N  Do you have problems with loss of bowel control? N  Managing your Medications? N  Comment wife manages  Managing your Finances? N  Housekeeping  or managing your Housekeeping? N  Some recent data might be hidden    Patient Care Team: [name], [name], MD as PCP - General (Family Medicine) Debara Pickett Nadean Corwin, MD as PCP - Cardiology (Cardiology) [name], MD as Consulting Physician (Endocrinology)   Assessment:   This is a routine wellness examination for [place]. Physical assessment deferred to PCP.   Exercise Activities and Dietary recommendations Current Exercise Habits: The patient does not participate in regular exercise at present, Exercise limited by: Other - see comments Diet (meal preparation, eat out, water intake, caffeinated beverages, dairy products, fruits and vegetables): in general, an "unhealthy" diet, on average, 4 fast food meals per week   Reviewed heart healthy and diabetic diet. Renal diet recommendations reviewed. Encouraged limiting TV dinners and going out to eat, choosing low fat, low-salt foods. Encouraged increased water hydration.  Goals    . Exercise 150 min/wk Moderate Activity     Will try to walk more;  Try to walk 1 mile  per day your neighborhood   Also; will look forward to hearing about your new adventure next year Find other hobby !    . Patient Stated     Understand my chronic kidney disease better, know how to improve it.  Continue to increase physical activity, hydration per endocrinologist's recommendations.        Fall Risk Fall Risk  02/20/2018 02/15/2017 02/15/2017 08/13/2015 04/17/2014  Falls in the past year? 0 No No No No  Number falls in past yr: 0 -  - - -  Risk for fall due to : Impaired vision;Medication side effect - - - -  Follow up Falls prevention discussed;Education provided - - - -    Depression Screen PHQ 2/9 Scores 02/20/2018 02/15/2017 02/15/2017 08/13/2015  PHQ - 2 Score 0 0 0 0    Cognitive Function MMSE - Mini Mental State Exam 02/20/2018 02/15/2017  Not completed: - (No Data)  Orientation to time 5 -  Orientation to Place 5 -  Registration 3 -  Attention/ Calculation 5 -  Recall 3 -  Language- name 2 objects 2 -  Language- repeat 1 -  Language- follow 3 step command 3 -  Language- read & follow direction 1 -  Write a sentence 1 -  Copy design 1 -  Total score 30 -     6CIT Screen 02/15/2017  What Year? 0 points  What month? 0 points  What time? 0 points  Count back from 20 0 points  Months in reverse 0 points  Repeat phrase 0 points  Total Score 0    Immunization History  Administered Date(s) Administered  . Influenza-Unspecified 01/08/2017  . Pneumococcal Polysaccharide-23 01/28/2017    Qualifies for Shingles Vaccine? Yes, counseled to receive at local pharmacy; pt. And wife at side verbalized understanding.  Screening Tests Health Maintenance  Topic Date Due  . TETANUS/TDAP  03/10/1964  . OPHTHALMOLOGY EXAM  01/08/2018  . PNA vac Low Risk Adult (2 of 2 - PCV13) 01/28/2018  . INFLUENZA VACCINE  12/21/2018 (Originally 09/08/2017)  . HEMOGLOBIN A1C  05/19/2018  . FOOT EXAM  11/18/2018  . COLONOSCOPY  03/31/2020  . Hepatitis C Screening  Completed      Plan:    Bring a copy of your living will and/or healthcare power of attorney to your next office visit.  Continue doing brain stimulating activities (puzzles, reading, adult coloring books, staying active) to keep memory sharp.   Follow up with endocrinologist re: CKD,  specific instructions.  Review diet recommendations provided  I have personally reviewed and noted the following in the patient's chart:   . Medical and social history . Use of  alcohol, tobacco or illicit drugs  . Current medications and supplements . Functional ability and status . Nutritional status . Physical activity . Advanced directives . List of other physicians . Vitals . Screenings to include cognitive, depression, and falls . Referrals and appointments  In addition, I have reviewed and discussed with patient certain preventive protocols, quality metrics, and best practice recommendations. A written personalized care plan for preventive services as well as general preventive health recommendations were provided to patient.     Alphia Moh, RN  02/20/2018

## 2018-02-17 ENCOUNTER — Ambulatory Visit: Payer: Medicare Other

## 2018-02-17 DIAGNOSIS — Z85828 Personal history of other malignant neoplasm of skin: Secondary | ICD-10-CM | POA: Diagnosis not present

## 2018-02-17 DIAGNOSIS — D2261 Melanocytic nevi of right upper limb, including shoulder: Secondary | ICD-10-CM | POA: Diagnosis not present

## 2018-02-17 DIAGNOSIS — C44519 Basal cell carcinoma of skin of other part of trunk: Secondary | ICD-10-CM | POA: Diagnosis not present

## 2018-02-17 DIAGNOSIS — D2272 Melanocytic nevi of left lower limb, including hip: Secondary | ICD-10-CM | POA: Diagnosis not present

## 2018-02-17 DIAGNOSIS — D2262 Melanocytic nevi of left upper limb, including shoulder: Secondary | ICD-10-CM | POA: Diagnosis not present

## 2018-02-17 DIAGNOSIS — L570 Actinic keratosis: Secondary | ICD-10-CM | POA: Diagnosis not present

## 2018-02-17 DIAGNOSIS — D225 Melanocytic nevi of trunk: Secondary | ICD-10-CM | POA: Diagnosis not present

## 2018-02-17 DIAGNOSIS — L821 Other seborrheic keratosis: Secondary | ICD-10-CM | POA: Diagnosis not present

## 2018-02-20 ENCOUNTER — Ambulatory Visit (INDEPENDENT_AMBULATORY_CARE_PROVIDER_SITE_OTHER): Payer: Medicare Other

## 2018-02-20 VITALS — BP 122/68 | HR 82 | Resp 16 | Ht 67.0 in | Wt 173.0 lb

## 2018-02-20 DIAGNOSIS — Z0000 Encounter for general adult medical examination without abnormal findings: Secondary | ICD-10-CM | POA: Diagnosis not present

## 2018-02-20 NOTE — Patient Instructions (Addendum)
Bring a copy of your living will and/or healthcare power of attorney to your next office visit.  Continue doing brain stimulating activities (puzzles, reading, adult coloring books, staying active) to keep memory sharp.   Follow up with endocrinologist re: CKD, specific instructions.  Review diet recommendations provided.   Daryl Webb , Thank you for taking time to come for your Medicare Wellness Visit. I appreciate your ongoing commitment to your health goals. Please review the following plan we discussed and let me know if I can assist you in the future.   These are the goals we discussed: Goals    . Exercise 150 min/wk Moderate Activity     Will try to walk more;  Try to walk 1 mile  per day your neighborhood   Also; will look forward to hearing about your new adventure next year Find other hobby !    . Patient Stated     Understand my chronic kidney disease better, know how to improve it.  Continue to increase physical activity, hydration per endocrinologist's recommendations.        This is a list of the screening recommended for you and due dates:  Health Maintenance  Topic Date Due  . Tetanus Vaccine  03/10/1964  . Eye exam for diabetics  01/08/2018  . Pneumonia vaccines (2 of 2 - PCV13) 01/28/2018  . Flu Shot  12/21/2018*  . Hemoglobin A1C  05/19/2018  . Complete foot exam   11/18/2018  . Colon Cancer Screening  03/31/2020  .  Hepatitis C: One time screening is recommended by Center for Disease Control  (CDC) for  adults born from [birth year] through 1965.   Completed  *Topic was postponed. The date shown is not the original due date.     Fall Prevention in the Home, Adult Falls can cause injuries. They can happen to people of all ages. There are many things you can do to make your home safe and to help prevent falls. Ask for help when making these changes, if needed. What actions can I take to prevent falls? General Instructions  Use good lighting in all rooms.  Replace any light bulbs that burn out.  Turn on the lights when you go into a dark area. Use night-lights.  Keep items that you use often in easy-to-reach places. Lower the shelves around your home if necessary.  Set up your furniture so you have a clear path. Avoid moving your furniture around.  Do not have throw rugs and other things on the floor that can make you trip.  Avoid walking on wet floors.  If any of your floors are uneven, fix them.  Add color or contrast paint or tape to clearly mark and help you see: ? Any grab bars or handrails. ? First and last steps of stairways. ? Where the edge of each step is.  If you use a stepladder: ? Make sure that it is fully opened. Do not climb a closed stepladder. ? Make sure that both sides of the stepladder are locked into place. ? Ask someone to hold the stepladder for you while you use it.  If there are any pets around you, be aware of where they are. What can I do in the bathroom?      Keep the floor dry. Clean up any water that spills onto the floor as soon as it happens.  Remove soap buildup in the tub or shower regularly.  Use non-skid mats or decals on the floor of  the tub or shower.  Attach bath mats securely with double-sided, non-slip rug tape.  If you need to sit down in the shower, use a plastic, non-slip stool.  Install grab bars by the toilet and in the tub and shower. Do not use towel bars as grab bars. What can I do in the bedroom?  Make sure that you have a light by your bed that is easy to reach.  Do not use any sheets or blankets that are too big for your bed. They should not hang down onto the floor.  Have a firm chair that has side arms. You can use this for support while you get dressed. What can I do in the kitchen?  Clean up any spills right away.  If you need to reach something above you, use a strong step stool that has a grab bar.  Keep electrical cords out of the way.  Do not use floor  polish or wax that makes floors slippery. If you must use wax, use non-skid floor wax. What can I do with my stairs?  Do not leave any items on the stairs.  Make sure that you have a light switch at the top of the stairs and the bottom of the stairs. If you do not have them, ask someone to add them for you.  Make sure that there are handrails on both sides of the stairs, and use them. Fix handrails that are broken or loose. Make sure that handrails are as long as the stairways.  Install non-slip stair treads on all stairs in your home.  Avoid having throw rugs at the top or bottom of the stairs. If you do have throw rugs, attach them to the floor with carpet tape.  Choose a carpet that does not hide the edge of the steps on the stairway.  Check any carpeting to make sure that it is firmly attached to the stairs. Fix any carpet that is loose or worn. What can I do on the outside of my home?  Use bright outdoor lighting.  Regularly fix the edges of walkways and driveways and fix any cracks.  Remove anything that might make you trip as you walk through a door, such as a raised step or threshold.  Trim any bushes or trees on the path to your home.  Regularly check to see if handrails are loose or broken. Make sure that both sides of any steps have handrails.  Install guardrails along the edges of any raised decks and porches.  Clear walking paths of anything that might make someone trip, such as tools or rocks.  Have any leaves, snow, or ice cleared regularly.  Use sand or salt on walking paths during winter.  Clean up any spills in your garage right away. This includes grease or oil spills. What other actions can I take?  Wear shoes that: ? Have a low heel. Do not wear high heels. ? Have rubber bottoms. ? Are comfortable and fit you well. ? Are closed at the toe. Do not wear open-toe sandals.  Use tools that help you move around (mobility aids) if they are needed. These  include: ? Canes. ? Walkers. ? Scooters. ? Crutches.  Review your medicines with your doctor. Some medicines can make you feel dizzy. This can increase your chance of falling. Ask your doctor what other things you can do to help prevent falls. Where to find more information  Centers for Disease Control and Prevention, [facility]: https://garcia.biz/  [employer]  [employer]: BrainJudge.co.uk Contact a doctor if:  You are afraid of falling at home.  You feel weak, drowsy, or dizzy at home.  You fall at home. Summary  There are many simple things that you can do to make your home safe and to help prevent falls.  Ways to make your home safe include removing tripping hazards and installing grab bars in the bathroom.  Ask for help when making these changes in your home. This information is not intended to replace advice given to you by your health care provider. Make sure you discuss any questions you have with your health care provider. Document Released: 11/21/2008 Document Revised: 09/09/2016 Document Reviewed: 09/09/2016 Elsevier Interactive Patient Education  2019 [place] Maintenance, Male A healthy lifestyle and preventive care is important for your health and wellness. Ask your health care provider about what schedule of regular examinations is right for you. What should I know about weight and diet? Eat a Healthy Diet  Eat plenty of vegetables, fruits, whole grains, low-fat dairy products, and lean protein.  Do not eat a lot of foods high in solid fats, added sugars, or salt.  Maintain a Healthy Weight Regular exercise can help you achieve or maintain a healthy weight. You should:  Do at least 150 minutes of exercise each week. The exercise should increase your heart rate and make you sweat (moderate-intensity exercise).  Do strength-training exercises at least twice a week. Watch Your Levels of Cholesterol and Blood Lipids  Have your blood  tested for lipids and cholesterol every 5 years starting at 73 years of age. If you are at high risk for heart disease, you should start having your blood tested when you are 73 years old. You may need to have your cholesterol levels checked more often if: ? Your lipid or cholesterol levels are high. ? You are older than 73 years of age. ? You are at high risk for heart disease. What should I know about cancer screening? Many types of cancers can be detected early and may often be prevented. Lung Cancer  You should be screened every year for lung cancer if: ? You are a current smoker who has smoked for at least 30 years. ? You are a former smoker who has quit within the past 15 years.  Talk to your health care provider about your screening options, when you should start screening, and how often you should be screened. Colorectal Cancer  Routine colorectal cancer screening usually begins at 73 years of age and should be repeated every 5-10 years until you are 74 years old. You may need to be screened more often if early forms of precancerous polyps or small growths are found. Your health care provider may recommend screening at an earlier age if you have risk factors for colon cancer.  Your health care provider may recommend using home test kits to check for hidden blood in the stool.  A small camera at the end of a tube can be used to examine your colon (sigmoidoscopy or colonoscopy). This checks for the earliest forms of colorectal cancer. Prostate and Testicular Cancer  Depending on your age and overall health, your health care provider may do certain tests to screen for prostate and testicular cancer.  Talk to your health care provider about any symptoms or concerns you have about testicular or prostate cancer. Skin Cancer  Check your skin from head to toe regularly.  Tell your health care provider about any  new moles or changes in moles, especially if: ? There is a change in a  mole's size, shape, or color. ? You have a mole that is larger than a pencil eraser.  Always use sunscreen. Apply sunscreen liberally and repeat throughout the day.  Protect yourself by wearing long sleeves, pants, a wide-brimmed hat, and sunglasses when outside. What should I know about heart disease, diabetes, and high blood pressure?  If you are 67-82 years of age, have your blood pressure checked every 3-5 years. If you are 9 years of age or older, have your blood pressure checked every year. You should have your blood pressure measured twice-once when you are at a hospital or clinic, and once when you are not at a hospital or clinic. Record the average of the two measurements. To check your blood pressure when you are not at a hospital or clinic, you can use: ? An automated blood pressure machine at a pharmacy. ? A home blood pressure monitor.  Talk to your health care provider about your target blood pressure.  If you are between 85-61 years old, ask your health care provider if you should take aspirin to prevent heart disease.  Have regular diabetes screenings by checking your fasting blood sugar level. ? If you are at a normal weight and have a low risk for diabetes, have this test once every three years after the age of 70. ? If you are overweight and have a high risk for diabetes, consider being tested at a younger age or more often.  A one-time screening for abdominal aortic aneurysm (AAA) by ultrasound is recommended for men aged 33-75 years who are current or former smokers. What should I know about preventing infection? Hepatitis B If you have a higher risk for hepatitis B, you should be screened for this virus. Talk with your health care provider to find out if you are at risk for hepatitis B infection. Hepatitis C Blood testing is recommended for:  Everyone born from [birth year] through 1965.  Anyone with known risk factors for hepatitis C. Sexually Transmitted Diseases  (STDs)  You should be screened each year for STDs including gonorrhea and chlamydia if: ? You are sexually active and are younger than 73 years of age. ? You are older than 73 years of age and your health care provider tells you that you are at risk for this type of infection. ? Your sexual activity has changed since you were last screened and you are at an increased risk for chlamydia or gonorrhea. Ask your health care provider if you are at risk.  Talk with your health care provider about whether you are at high risk of being infected with HIV. Your health care provider may recommend a prescription medicine to help prevent HIV infection. What else can I do?  Schedule regular health, dental, and eye exams.  Stay current with your vaccines (immunizations).  Do not use any tobacco products, such as cigarettes, chewing tobacco, and e-cigarettes. If you need help quitting, ask your health care provider.  Limit alcohol intake to no more than 2 drinks per day. One drink equals 12 ounces of beer, 5 ounces of wine, or 1 ounces of hard liquor.  Do not use street drugs.  Do not share needles.  Ask your health care provider for help if you need support or information about quitting drugs.  Tell your health care provider if you often feel depressed.  Tell your health care provider if you have ever  been abused or do not feel safe at home. This information is not intended to replace advice given to you by your health care provider. Make sure you discuss any questions you have with your health care provider. Document Released: 07/24/2007 Document Revised: 09/24/2015 Document Reviewed: 10/29/2014 Elsevier Interactive Patient Education  2019 [place] for Chronic Kidney Disease When your kidneys are not working well, they cannot remove waste and excess substances from your blood as effectively as they did before. This can lead to a buildup and imbalance of these substances, which  can worsen kidney damage and affect how your body functions. Certain foods lead to a buildup of these substances in the body. By changing your diet as recommended by your diet and nutrition specialist (dietitian) or health care provider, you could help prevent further kidney damage and delay or prevent the need for dialysis. What are tips for following this plan? General instructions   Work with your health care provider and dietitian to develop a meal plan that is right for you. Foods you can eat, limit, or avoid will be different for each person depending on the stage of kidney disease and any other existing health conditions.  Talk with your health care provider about whether you should take a vitamin and mineral supplement.  Use standard measuring cups and spoons to measure servings of foods. Use a kitchen scale to measure portions of protein foods.  If directed by your health care provider, avoid drinking too much fluid. Measure and count all liquids, including water, ice, soups, flavored gelatin, and frozen desserts such as popsicles or ice cream. Reading food labels  Check the amount of sodium in foods. Choose foods that have less than 300 milligrams (mg) per serving.  Check the ingredient list for phosphorus or potassium-based additives or preservatives.  Check the amount of saturated and trans fat. Limit or avoid these fats as told by your dietitian. Shopping  Avoid buying foods that are: ? Processed, frozen, or prepackaged. ? Calcium-enriched or fortified.  Do not buy foods that have salt or sodium listed among the first five ingredients.  Do not buy canned vegetables. Cooking  Replace animal proteins, such as meat, fish, eggs, or dairy, with plant proteins from beans, nuts, and soy. ? Use soy milk instead of cow's milk. ? Add beans or tofu to soups, casseroles, or pasta dishes instead of meat.  Soak vegetables, such as potatoes, before cooking to reduce potassium. To do  this: ? Peel and cut into small pieces. ? Soak in warm water for at least 2 hours. For every 1 cup of vegetables, use 10 cups of water. ? Drain and rinse with warm water. ? Boil for at least 5 minutes. Meal planning  Limit the amount of protein from plant and animal sources you eat each day.  Do not add salt to food when cooking or before eating.  Eat meals and snacks at around the same time each day. If you have diabetes:  If you have diabetes (diabetes mellitus) and chronic kidney disease, it is important to keep your blood glucose in the target range recommended by your health care provider. Follow your diabetes management plan. This may include: ? Checking your blood glucose regularly. ? Taking oral medicines, insulin, or both. ? Exercising for at least 30 minutes on 5 or more days each week, or as told by your health care provider. ? Tracking how many servings of carbohydrates you eat at each meal.  You  may be given specific guidelines on how much of certain foods and nutrients you may eat, depending on your stage of kidney disease and whether you have high blood pressure (hypertension). Follow your meal plan as told by your dietitian. What nutrients should be limited? The items listed are not a complete list. Talk with your dietitian about what dietary choices are best for you. Potassium Potassium affects how steadily your heart beats. If too much potassium builds up in your blood, it can cause an irregular heartbeat or even a heart attack. You may need to eat less potassium, depending on your blood potassium levels and the stage of kidney disease. Talk to your dietitian about how much potassium you may have each day. You may need to limit or avoid foods that are high in potassium, such as:  Milk and soy milk.  Fruits, such as bananas, papaya, apricots, nectarines, melon, prunes, raisins, kiwi, and oranges.  Vegetables, such as potatoes, sweet potatoes, yams, tomatoes, leafy  greens, beets, okra, avocado, pumpkin, and winter squash.  White and lima beans. Phosphorus Phosphorus is a mineral found in your bones. A balance between calcium and phosphorous is needed to build and maintain healthy bones. Too much phosphorus pulls calcium from your bones. This can make your bones weak and more likely to break. Too much phosphorus can also make your skin itch. You may need to eat less phosphorus depending on your blood phosphorus levels and the stage of kidney disease. Talk to your dietitian about how much potassium you may have each day. You may need to take medicine to lower your blood phosphorus levels if diet changes do not help. You may need to limit or avoid foods that are high in phosphorus, such as:  Milk and dairy products.  Dried beans and peas.  Tofu, soy milk, and other soy-based meat replacements.  Colas.  Nuts and peanut butter.  Meat, poultry, and fish.  Bran cereals and oatmeals. Protein Protein helps you to make and keep muscle. It also helps in the repair of your body's cells and tissues. One of the natural breakdown products of protein is a waste product called urea. When your kidneys are not working properly, they cannot remove wastes, such as urea, like they did before you developed chronic kidney disease. Reducing how much protein you eat can help prevent a buildup of urea in your blood. Depending on your stage of kidney disease, you may need to limit foods that are high in protein. Sources of animal protein include:  Meat (all types).  Fish and seafood.  Poultry.  Eggs.  Dairy. Other protein foods include:  Beans and legumes.  Nuts and nut butter.  Soy and tofu. Sodium Sodium, which is found in salt, helps maintain a healthy balance of fluids in your body. Too much sodium can increase your blood pressure and have a negative effect on the function of your heart and lungs. Too much sodium can also cause your body to retain too much  fluid, making your kidneys work harder. Most people should have less than 2,300 milligrams (mg) of sodium each day. If you have hypertension, you may need to limit your sodium to 1,500 mg each day. Talk to your dietitian about how much sodium you may have each day. You may need to limit or avoid foods that are high in sodium, such as:  Salt seasonings.  Soy sauce.  Cured and processed meats.  Salted crackers and snack foods.  Fast food.  Canned soups  and most canned foods.  Pickled foods.  Vegetable juice.  Boxed mixes or ready-to-eat boxed meals and side dishes.  Bottled dressings, sauces, and marinades. Summary  Chronic kidney disease can lead to a buildup and imbalance of waste and excess substances in the body. Certain foods lead to a buildup of these substances. By adjusting your intake of these foods, you could help prevent more kidney damage and delay or prevent the need for dialysis.  Food adjustments are different for each person with chronic kidney disease. Work with a dietitian to set up nutrient goals and a meal plan that is right for you.  If you have diabetes and chronic kidney disease, it is important to keep your blood glucose in the target range recommended by your health care provider. This information is not intended to replace advice given to you by your health care provider. Make sure you discuss any questions you have with your health care provider. Document Released: 04/17/2002 Document Revised: 01/21/2016 Document Reviewed: 01/21/2016 Elsevier Interactive Patient Education  2019 [place].  Low-Sodium Eating Plan Sodium, which is an element that makes up salt, helps you maintain a healthy balance of fluids in your body. Too much sodium can increase your blood pressure and cause fluid and waste to be held in your body. Your health care provider or dietitian may recommend following this plan if you have high blood pressure (hypertension), kidney disease,  liver disease, or heart failure. Eating less sodium can help lower your blood pressure, reduce swelling, and protect your heart, liver, and kidneys. What are tips for following this plan? General guidelines  Most people on this plan should limit their sodium intake to 1,500-2,000 mg (milligrams) of sodium each day. Reading food labels   The Nutrition Facts label lists the amount of sodium in one serving of the food. If you eat more than one serving, you must multiply the listed amount of sodium by the number of servings.  Choose foods with less than 140 mg of sodium per serving.  Avoid foods with 300 mg of sodium or more per serving. Shopping  Look for lower-sodium products, often labeled as "low-sodium" or "no salt added."  Always check the sodium content even if foods are labeled as "unsalted" or "no salt added".  Buy fresh foods. ? Avoid canned foods and premade or frozen meals. ? Avoid canned, cured, or processed meats  Buy breads that have less than 80 mg of sodium per slice. Cooking  Eat more home-cooked food and less restaurant, buffet, and fast food.  Avoid adding salt when cooking. Use salt-free seasonings or herbs instead of table salt or sea salt. Check with your health care provider or pharmacist before using salt substitutes.  Cook with plant-based oils, such as canola, sunflower, or olive oil. Meal planning  When eating at a restaurant, ask that your food be prepared with less salt or no salt, if possible.  Avoid foods that contain MSG (monosodium glutamate). MSG is sometimes added to [place] food, bouillon, and some canned foods. What foods are recommended? The items listed may not be a complete list. Talk with your dietitian about what dietary choices are best for you. Grains Low-sodium cereals, including oats, puffed wheat and rice, and shredded wheat. Low-sodium crackers. Unsalted rice. Unsalted pasta. Low-sodium bread. Whole-grain breads and whole-grain  pasta. Vegetables Fresh or frozen vegetables. "No salt added" canned vegetables. "No salt added" tomato sauce and paste. Low-sodium or reduced-sodium tomato and vegetable juice. Fruits Fresh, frozen, or canned  fruit. Fruit juice. Meats and other protein foods Fresh or frozen (no salt added) meat, poultry, seafood, and fish. Low-sodium canned tuna and salmon. Unsalted nuts. Dried peas, beans, and lentils without added salt. Unsalted canned beans. Eggs. Unsalted nut butters. Dairy Milk. Soy milk. Cheese that is naturally low in sodium, such as ricotta cheese, fresh mozzarella, or Swiss cheese Low-sodium or reduced-sodium cheese. Cream cheese. Yogurt. Fats and oils Unsalted butter. Unsalted margarine with no trans fat. Vegetable oils such as canola or olive oils. Seasonings and other foods Fresh and dried herbs and spices. Salt-free seasonings. Low-sodium mustard and ketchup. Sodium-free salad dressing. Sodium-free light mayonnaise. Fresh or refrigerated horseradish. Lemon juice. Vinegar. Homemade, reduced-sodium, or low-sodium soups. Unsalted popcorn and pretzels. Low-salt or salt-free chips. What foods are not recommended? The items listed may not be a complete list. Talk with your dietitian about what dietary choices are best for you. Grains Instant hot cereals. Bread stuffing, pancake, and biscuit mixes. Croutons. Seasoned rice or pasta mixes. Noodle soup cups. Boxed or frozen macaroni and cheese. Regular salted crackers. Self-rising flour. Vegetables Sauerkraut, pickled vegetables, and relishes. Olives. [place] fries. Onion rings. Regular canned vegetables (not low-sodium or reduced-sodium). Regular canned tomato sauce and paste (not low-sodium or reduced-sodium). Regular tomato and vegetable juice (not low-sodium or reduced-sodium). Frozen vegetables in sauces. Meats and other protein foods Meat or fish that is salted, canned, smoked, spiced, or pickled. Bacon, ham, sausage, hotdogs, corned  beef, chipped beef, packaged lunch meats, salt pork, jerky, pickled herring, anchovies, regular canned tuna, sardines, salted nuts. Dairy Processed cheese and cheese spreads. Cheese curds. Blue cheese. Feta cheese. String cheese. Regular cottage cheese. Buttermilk. Canned milk. Fats and oils Salted butter. Regular margarine. Ghee. Bacon fat. Seasonings and other foods Onion salt, garlic salt, seasoned salt, table salt, and sea salt. Canned and packaged gravies. Worcestershire sauce. Tartar sauce. Barbecue sauce. Teriyaki sauce. Soy sauce, including reduced-sodium. Steak sauce. Fish sauce. Oyster sauce. Cocktail sauce. Horseradish that you find on the shelf. Regular ketchup and mustard. Meat flavorings and tenderizers. Bouillon cubes. Hot sauce and Tabasco sauce. Premade or packaged marinades. Premade or packaged taco seasonings. Relishes. Regular salad dressings. Salsa. Potato and tortilla chips. Corn chips and puffs. Salted popcorn and pretzels. Canned or dried soups. Pizza. Frozen entrees and pot pies. Summary  Eating less sodium can help lower your blood pressure, reduce swelling, and protect your heart, liver, and kidneys.  Most people on this plan should limit their sodium intake to 1,500-2,000 mg (milligrams) of sodium each day.  Canned, boxed, and frozen foods are high in sodium. Restaurant foods, fast foods, and pizza are also very high in sodium. You also get sodium by adding salt to food.  Try to cook at home, eat more fresh fruits and vegetables, and eat less fast food, canned, processed, or prepared foods. This information is not intended to replace advice given to you by your health care provider. Make sure you discuss any questions you have with your health care provider. Document Released: 07/17/2001 Document Revised: 01/19/2016 Document Reviewed: 01/19/2016 Elsevier Interactive Patient Education  2019 [employer].

## 2018-02-21 ENCOUNTER — Other Ambulatory Visit: Payer: Self-pay | Admitting: Internal Medicine

## 2018-02-21 MED ORDER — LOSARTAN POTASSIUM-HCTZ 50-12.5 MG PO TABS: 1.0000 | ORAL_TABLET | Freq: Every day | ORAL | 0 refills | Status: DC

## 2018-02-21 NOTE — Telephone Encounter (Signed)
Rx(s) sent to pharmacy electronically.  

## 2018-02-22 ENCOUNTER — Telehealth: Payer: Self-pay | Admitting: Internal Medicine

## 2018-02-22 MED ORDER — LOSARTAN POTASSIUM-HCTZ 50-12.5 MG PO TABS: 1.0000 | ORAL_TABLET | Freq: Every day | ORAL | 0 refills | Status: DC

## 2018-02-22 NOTE — Telephone Encounter (Signed)
Spoke with patient he stated he was going our of town for 1 month and wanted to know if we could send his Hyzaar 50/12.5mg  to his local pharmacy because he was going out of town and did not want to run out of medication. He stated he contacted [facility], but they stated they would not refill his medication. Patient states he is scared of running out of medication. I told patient that I would send a 30 day supply [facility]. Patient thanked me for my help.

## 2018-02-22 NOTE — Telephone Encounter (Signed)
Pt calling requesting a call back concerning his medication of Losartan-HCTZ. Pt is going out of town and need this medication sent in to accommodate him going out of town. Pt would like a call at [phone]. Please address

## 2018-02-22 NOTE — Telephone Encounter (Signed)
Left message for patient to contact office to discuss his meds.

## 2018-03-01 DIAGNOSIS — N35012 Post-traumatic membranous urethral stricture: Secondary | ICD-10-CM | POA: Diagnosis not present

## 2018-03-01 DIAGNOSIS — Z8546 Personal history of malignant neoplasm of prostate: Secondary | ICD-10-CM | POA: Diagnosis not present

## 2018-03-01 NOTE — Progress Notes (Signed)
I have reviewed available documentation from this visit and I agree with recommendations given.  Keithan Dileonardo G. Alyus Mofield, MD  [facility]. [place] office.  

## 2018-03-03 DIAGNOSIS — E538 Deficiency of other specified B group vitamins: Secondary | ICD-10-CM | POA: Diagnosis not present

## 2018-04-12 DIAGNOSIS — E538 Deficiency of other specified B group vitamins: Secondary | ICD-10-CM | POA: Diagnosis not present

## 2018-04-20 ENCOUNTER — Other Ambulatory Visit: Payer: Self-pay | Admitting: Internal Medicine

## 2018-04-26 ENCOUNTER — Telehealth: Payer: Self-pay | Admitting: *Deleted

## 2018-04-26 NOTE — Telephone Encounter (Signed)
COVID-19 Pre-Screening Questions:  . Have you been in contact with someone that was recently sick with fever/cough or confirmed to have the [place] virus?  no  *Contact with a confirmed case should stay at home, away from confirmed patient, monitor symptoms, and reach out to PCP for e-visit/additional testing.  2. Do you have any of the following symptoms [cough, fever (100.4 or greater)], and/or shortness of breath)?  no  *ALL PTS W/ FEVER SHOULD BE REFERRED TO PCP FOR E-VISIT* ________________________________________________________________________________  Cardiac Questionnaire:    Since your last visit or hospitalization:    1. Have you been having chest pain? no   2. Have you been having shortness of breath? no   3. Have you been having increasing edema, wt gain, or increase in abdominal girth (pants fitting more tightly)? no   4. Have you had any passing out spells? no    *A YES to any of these questions would result in the appointment being kept.  *If all the answers to these questions are NO, we should indicate that given the current situation regarding the worldwide coronarvirus pandemic, at the recommendation of the CDC, we are looking to limit gatherings in our waiting area, and thus will reschedule their appointment beyond four weeks from today.    Patient appt cancelled and will be reschedule.

## 2018-04-27 ENCOUNTER — Ambulatory Visit: Payer: Medicare Other | Admitting: Internal Medicine

## 2018-05-03 ENCOUNTER — Ambulatory Visit: Payer: Medicare Other | Admitting: Family Medicine

## 2018-05-18 DIAGNOSIS — E119 Type 2 diabetes mellitus without complications: Secondary | ICD-10-CM | POA: Diagnosis not present

## 2018-05-18 DIAGNOSIS — E538 Deficiency of other specified B group vitamins: Secondary | ICD-10-CM | POA: Diagnosis not present

## 2018-06-05 ENCOUNTER — Ambulatory Visit: Payer: Medicare Other | Admitting: Family Medicine

## 2018-07-12 ENCOUNTER — Other Ambulatory Visit: Payer: Self-pay | Admitting: Internal Medicine

## 2018-07-17 ENCOUNTER — Other Ambulatory Visit: Payer: Self-pay

## 2018-07-17 ENCOUNTER — Encounter: Payer: Self-pay | Admitting: Family Medicine

## 2018-07-17 ENCOUNTER — Ambulatory Visit (INDEPENDENT_AMBULATORY_CARE_PROVIDER_SITE_OTHER): Payer: Medicare Other | Admitting: Family Medicine

## 2018-07-17 VITALS — BP 110/80 | HR 88 | Temp 98.1°F | Resp 12 | Ht 67.0 in | Wt 172.2 lb

## 2018-07-17 DIAGNOSIS — I10 Essential (primary) hypertension: Secondary | ICD-10-CM | POA: Diagnosis not present

## 2018-07-17 DIAGNOSIS — N183 Chronic kidney disease, stage 3 unspecified: Secondary | ICD-10-CM

## 2018-07-17 DIAGNOSIS — M159 Polyosteoarthritis, unspecified: Secondary | ICD-10-CM

## 2018-07-17 DIAGNOSIS — E782 Mixed hyperlipidemia: Secondary | ICD-10-CM

## 2018-07-17 DIAGNOSIS — E119 Type 2 diabetes mellitus without complications: Secondary | ICD-10-CM

## 2018-07-17 MED ORDER — TRAMADOL HCL 50 MG PO TABS: 50.0000 mg | ORAL_TABLET | Freq: Every day | ORAL | 0 refills | Status: DC | PRN

## 2018-07-17 NOTE — Progress Notes (Signed)
HPI:   [name] is a 73 y.o. male, who is here today for chronic disease management. Last seen here in the office on 12/30/2017. Since his last visit he has follow-up with his endocrinologist, Dr. Buddy Webb. A1c normal at 6.6 on 05/18/2018. He is on metformin and Bydureon weekly.  Generalized OA, he is taking Diclofenac 75 mg daily as needed.  He has been taking same medication for 20+ years.  He states that he has been taking diclofenac daily because "pain all over my body ,mainly lower back and hips." Diclofenac really helps with pain. He tried Voltaren gel, he can apply it on his back.  Osteoarthritis mainly affecting shoulders, lower back, knees, and hips. Moderate pain. Morning stiffness.  Pain is exacerbated by prolonged standing, walking, and doing yard work. Alleviated by rest.  He denies side effects from diclofenac.  CKD III, last Cr at [facility] office was elevated at 1.6. He denies gross hematuria, foamy urine, or decreased urine output.  He would like to continue taking Diclofenac,he would like 3 months supply.   Hypertension, he is on losartan-HCTZ 50-12.5 mg daily and metoprolol succinate 50 mg daily. He is not checking BP regularly. Denies severe/frequent headache, visual changes, chest pain, dyspnea, palpitation, claudication, focal weakness, or edema.  Hyperlipidemia, currently he is on atorvastatin 40 mg daily. Fasting lipid panel done on 05/18/2018. HDL 34, LDL 52, TC 103, and TG 89. He follows a low-fat diet. He has tolerated medication well.   Review of Systems  Constitutional: Negative for activity change, chills and fever.  HENT: Negative for nosebleeds and sore throat.   Gastrointestinal: Negative for abdominal pain, nausea and vomiting.       No changes in bowel habits.  Endocrine: Negative for polydipsia, polyphagia and polyuria.  Musculoskeletal: Negative for gait problem and joint swelling.  Skin: Negative for rash and wound.   Psychiatric/Behavioral: The patient is nervous/anxious.   Rest see pertinent positives and negatives per HPI.   Current Outpatient Medications on File Prior to Visit  Medication Sig Dispense Refill  . amLODipine (NORVASC) 5 MG tablet TAKE 1 TABLET DAILY 90 tablet 3  . amoxicillin (AMOXIL) 500 MG capsule TAKE 4 CAPSULES 1 HOUR PRIOR TO APPT  2  . aspirin 81 MG tablet Take 81 mg by mouth daily.    . Aspirin-Calcium Carbonate 81-777 MG TABS Take by mouth.    Marland Kitchen atorvastatin (LIPITOR) 40 MG tablet TAKE 1 TABLET DAILY 90 tablet 3  . Cholecalciferol 2000 UNITS CAPS Take 1 capsule by mouth daily. VITAMIN D 3    . Coenzyme Q10 (COQ10) 100 MG CAPS Take 100 mg by mouth daily.    . cyanocobalamin (,VITAMIN B-12,) 1000 MCG/ML injection Inject into the muscle every 30 (thirty) days.     . diclofenac (VOLTAREN) 75 MG EC tablet Take 1 tablet (75 mg total) by mouth daily as needed. 30 tablet 2  . Exenatide ER (BYDUREON) 2 MG PEN Inject 1 Dose into the skin once a week.    . loratadine (CLARITIN) 10 MG tablet Take 5 mg by mouth daily. As needed for allergies    . losartan-hydrochlorothiazide (HYZAAR) 50-12.5 MG tablet Take 1 tablet by mouth daily. 30 tablet 0  . metFORMIN (GLUCOPHAGE) 1000 MG tablet Take 1 tablet (1,000 mg total) by mouth 2 (two) times daily with a meal. 180 tablet 3  . metoprolol succinate (TOPROL-XL) 50 MG 24 hr tablet TAKE 1 TABLET DAILY.  PLEASE KEEP UPCOMING       APPOINTMENT FOR FUTURE     REFILLS. 90 tablet 0  . Omega-3 Fatty Acids (FISH OIL) 1000 MG CAPS Take 1,000 mg by mouth daily.    Marland Kitchen Zoster Vaccine Adjuvanted [facility]) injection 0.5 ml in muscle and repeat in 8 weeks 0.5 mL 1   No current facility-administered medications on file prior to visit.      Past Medical History:  Diagnosis Date  . Anemia   . Arthritis   . Constipation due to pain medication    needs stool softner while on pain medication  . Diabetes mellitus   . Duodenal ulcer 08/13/2015   2014  - Dr  Daryl Webb/GI  . H/O: osteoarthritis   . History of prostate cancer   . History of urethral stricture   . Hypercholesterolemia   . Hypertension   . Ulcer duodenal hemorrhage    No Known Allergies  Social History   Socioeconomic History  . Marital status: Married    Spouse name: Not on file  . Number of children: 2  . Years of education: Not on file  . Highest education level: Not on file  Occupational History  . Occupation: Retired  Scientific laboratory technician  . Financial resource strain: Not hard at all  . Food insecurity:    Worry: Never true    Inability: Never true  . Transportation needs:    Medical: No    Non-medical: No  Tobacco Use  . Smoking status: Former Smoker    Packs/day: 1.50    Years: 20.00    Pack years: 30.00    Types: Cigarettes    Last attempt to quit: 02/09/1980    Years since quitting: 38.4  . Smokeless tobacco: Never Used  Substance and Sexual Activity  . Alcohol use: No  . Drug use: No  . Sexual activity: Not Currently  Lifestyle  . Physical activity:    Days per week: 0 days    Minutes per session: 0 min  . Stress: Not at all  Relationships  . Social connections:    Talks on phone: Twice a week    Gets together: Twice a week    Attends religious service: Never    Active member of club or organization: No    Attends meetings of clubs or organizations: Never    Relationship status: Married  Other Topics Concern  . 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 Lives with wife, two children in [place]. Enjoys reading, boating, travelling to [place] in the winter with wife.    Vitals:   07/17/18 1016  BP: 110/80  Pulse: 88  Resp: 12  Temp: 98.1 F (36.7 C)  SpO2: 94%   Body mass index is 26.98 kg/m.   Physical Exam  Nursing note and vitals reviewed. Constitutional: He is oriented to person, place, and time. He appears well-developed and well-nourished. No distress.  HENT:  Head: Normocephalic and atraumatic.   Mouth/Throat: Oropharynx is clear and moist and mucous membranes are normal.  Eyes: Pupils are equal, round, and reactive to light. Conjunctivae are normal.  Cardiovascular: Normal rate and regular rhythm.  Murmur (SEM I/VI RUSB) heard. Pulses:      Dorsalis pedis pulses are 2+ on the right side and 2+ on the left side.  Respiratory: Effort normal and breath sounds normal. No respiratory distress.  GI: Soft. He exhibits no mass. There is no hepatomegaly. There is no abdominal tenderness.  Musculoskeletal:        General: Edema (Trace pitting LE edema,bilateral.) present.     Comments: No signs of synovitis or significant joint abnormality.   Lymphadenopathy:    He has no cervical adenopathy.  Neurological: He is alert and oriented to person, place, and time. He has normal strength. No cranial nerve deficit. Gait (antalgic,knee pain) abnormal.  Skin: Skin is warm. No rash noted. No erythema.  Psychiatric: His mood appears anxious.  Well groomed, good eye contact.   Rest see pertinent positives and negatives per HPI.   ASSESSMENT AND PLAN:  [name] was seen today for follow-up.  Diagnoses and all orders for this visit:  Essential hypertension Adequately controlled. No changes in current management. DASH-low salt diet recommended. Eye exam recommended annually. F/U in 6 months, before if needed.   Mixed hyperlipidemia Adequately controlled except for low HDL. Increasing vegetable intake, avocado, regular physical activity (low impact), and wine 4 to 8 ounces a few times per week per week may help to increase HDL. Continue Atorvastatin 40 mg daily. F/U in a year.  Generalized osteoarthritis of multiple sites He would like to continue Diclofenac 75 mg daily. We discussed other options, he agrees with trying tramadol 50 mg daily. He states that if tramadol is not as effective as diclofenac, he would like to continue diclofenac 75 mg daily. Explained side effects of  NSAIDs, already has CKD III and Hx of peptic ulcer disease. He is skeptical, states that Dr. Buddy Webb has not mentioned/addressed problem and has not recommended stopping diclofenac.   CKD (chronic kidney disease), stage III ([place]) I do not have complete BMP,I see Cr of 1.6, higher than last one (1.4). We discussed NSAIDs side effects in detail. Adequate hydration and low-salt to continue. Adequate BP control. Continue losartan 50 mg. Follow-up in 5 months.  Type 2 diabetes mellitus without complication, without long-term current use of insulin (HCC) Problem is well controlled. Continue following with endocrinologist.    Return in about 5 months (around 12/17/2018) for HTN,CKD.    -[name] was advised to return sooner than planned today if new concerns arise.    Daryl Arboleda G. [name], MD  [facility]. [place] office.

## 2018-07-17 NOTE — Assessment & Plan Note (Addendum)
He would like to continue Diclofenac 75 mg daily. We discussed other options, he agrees with trying tramadol 50 mg daily. He states that if tramadol is not as effective as diclofenac, he would like to continue diclofenac 75 mg daily. Explained side effects of NSAIDs, already has CKD III and Hx of peptic ulcer disease. He is skeptical, states that Dr. Buddy Duty has not mentioned/addressed problem and has not recommended stopping diclofenac.

## 2018-07-17 NOTE — Assessment & Plan Note (Addendum)
Adequately controlled except for low HDL. Increasing vegetable intake, avocado, regular physical activity (low impact), and wine 4 to 8 ounces a few times per week per week may help to increase HDL. Continue Atorvastatin 40 mg daily. F/U in a year.

## 2018-07-17 NOTE — Patient Instructions (Signed)
A few things to remember from today's visit:   Essential hypertension  CKD (chronic kidney disease), stage III (HCC)  Dyslipidemia (high LDL; low HDL)  Generalized osteoarthritis of multiple sites  Mixed hyperlipidemia   Check renal function with next blood work and Dr Cindra Eves office.   Diclofenac immediate-release tablets What is this medicine? DICLOFENAC (dye KLOE fen ak) is a non-steroidal anti-inflammatory drug (NSAID). It is used to reduce swelling and to treat pain. It may be used to treat osteoarthritis, rheumatoid arthritis, mild to moderate pain, and painful monthly periods. This medicine may be used for other purposes; ask your health care provider or pharmacist if you have questions. COMMON BRAND NAME(S): Cataflam What should I tell my health care provider before I take this medicine? They need to know if you have any of these conditions: -asthma, especially aspirin sensitive asthma -coronary artery bypass graft (CABG) surgery within the past 2 weeks -drink more than 3 alcohol containing drinks a day -heart disease or circulation problems like heart failure or leg edema (fluid retention) -high blood pressure -kidney disease -liver disease -stomach bleeding or ulcers -an unusual or allergic reaction to diclofenac, aspirin, other NSAIDs, other medicines, foods, dyes, or preservatives -pregnant or trying to get pregnant -breast-feeding How should I use this medicine? Take this medicine by mouth with food and with a full glass of water. Follow the directions on the prescription label. Take your medicine at regular intervals. Do not take your medicine more often than directed. Long-term, continuous use may increase the risk of heart attack or stroke. A special MedGuide will be given to you by the pharmacist with each prescription and refill. Be sure to read this information carefully each time. Talk to your pediatrician regarding the use of this medicine in children.  Special care may be needed. Elderly patients over 9 years old may have a stronger reaction and need a smaller dose. Overdosage: If you think you have taken too much of this medicine contact a poison control center or emergency room at once. NOTE: This medicine is only for you. Do not share this medicine with others. What if I miss a dose? If you miss a dose, take it as soon as you can. If it is almost time for your next dose, take only that dose. Do not take double or extra doses. What may interact with this medicine? Do not take this medicine with any of the following medications: -cidofovir -ketorolac -methotrexate This medicine may also interact with the following medications: -alcohol -aspirin and aspirin-like medicines -cyclosporine -diuretics -lithium -medicines for blood pressure -medicines for osteoporosis -medicines that affect platelets -medicines that treat or prevent blood clots like warfarin -NSAIDs, medicines for pain and inflammation, like ibuprofen or naproxen -pemetrexed -steroid medicines like prednisone or cortisone This list may not describe all possible interactions. Give your health care provider a list of all the medicines, herbs, non-prescription drugs, or dietary supplements you use. Also tell them if you smoke, drink alcohol, or use illegal drugs. Some items may interact with your medicine. What should I watch for while using this medicine? Tell your doctor or health care professional if your pain does not get better. Talk to your doctor before taking another medicine for pain. Do not treat yourself. This medicine does not prevent heart attack or stroke. In fact, this medicine may increase the chance of a heart attack or stroke. The chance may increase with longer use of this medicine and in people who have heart disease. If you  take aspirin to prevent heart attack or stroke, talk with your doctor or health care professional. Do not take medicines such as  ibuprofen and naproxen with this medicine. Side effects such as stomach upset, nausea, or ulcers may be more likely to occur. Many medicines available without a prescription should not be taken with this medicine. This medicine can cause ulcers and bleeding in the stomach and intestines at any time during treatment. Do not smoke cigarettes or drink alcohol. These increase irritation to your stomach and can make it more susceptible to damage from this medicine. Ulcers and bleeding can happen without warning symptoms and can cause death. You may get drowsy or dizzy. Do not drive, use machinery, or do anything that needs mental alertness until you know how this medicine affects you. Do not stand or sit up quickly, especially if you are an older patient. This reduces the risk of dizzy or fainting spells. This medicine can cause you to bleed more easily. Try to avoid damage to your teeth and gums when you brush or floss your teeth. What side effects may I notice from receiving this medicine? Side effects that you should report to your doctor or health care professional as soon as possible: -allergic reactions like skin rash, itching or hives, swelling of the face, lips, or tongue -black or bloody stools, blood in the urine or vomit -blurred vision -chest pain -difficulty breathing or wheezing -nausea or vomiting -rash or fever -redness, blistering, peeling or loosening of the skin, including inside the mouth -slurred speech or weakness on one side of the body -unexplained weight gain or swelling -unusually weak or tired -yellowing of eyes or skin Side effects that usually do not require medical attention (report to your doctor or health care professional if they continue or are bothersome): -constipation -diarrhea -dizziness -headache -heartburn This list may not describe all possible side effects. Call your doctor for medical advice about side effects. You may report side effects to FDA at  1-800-FDA-1088. Where should I keep my medicine? Keep out of the reach of children. Store at room temperature below 30 degrees C (86 degrees F). Protect from moisture. Keep container tightly closed. Throw away any unused medicine after the expiration date. NOTE: This sheet is a summary. It may not cover all possible information. If you have questions about this medicine, talk to your doctor, pharmacist, or health care provider.  2019 Elsevier/Gold Standard (2008-06-14 14:28:04)  Please be sure medication list is accurate. If a new problem present, please set up appointment sooner than planned today.

## 2018-07-17 NOTE — Assessment & Plan Note (Signed)
Adequately controlled. No changes in current management. DASH-low salt diet recommended. Eye exam recommended annually. F/U in 6 months, before if needed.

## 2018-07-17 NOTE — Assessment & Plan Note (Signed)
Problem is well controlled. Continue following with endocrinologist.

## 2018-07-17 NOTE — Assessment & Plan Note (Addendum)
I do not have complete BMP,I see Cr of 1.6, higher than last one (1.4). We discussed NSAIDs side effects in detail. Adequate hydration and low-salt to continue. Adequate BP control. Continue losartan 50 mg. Follow-up in 5 months.

## 2018-07-22 ENCOUNTER — Other Ambulatory Visit: Payer: Self-pay | Admitting: Internal Medicine

## 2018-08-05 ENCOUNTER — Other Ambulatory Visit: Payer: Self-pay | Admitting: Family Medicine

## 2018-08-24 DIAGNOSIS — E119 Type 2 diabetes mellitus without complications: Secondary | ICD-10-CM | POA: Diagnosis not present

## 2018-08-24 DIAGNOSIS — N183 Chronic kidney disease, stage 3 (moderate): Secondary | ICD-10-CM | POA: Diagnosis not present

## 2018-08-24 DIAGNOSIS — Z5181 Encounter for therapeutic drug level monitoring: Secondary | ICD-10-CM | POA: Diagnosis not present

## 2018-08-24 DIAGNOSIS — E538 Deficiency of other specified B group vitamins: Secondary | ICD-10-CM | POA: Diagnosis not present

## 2018-08-28 DIAGNOSIS — N183 Chronic kidney disease, stage 3 (moderate): Secondary | ICD-10-CM | POA: Diagnosis not present

## 2018-08-29 ENCOUNTER — Encounter: Payer: Self-pay | Admitting: Family Medicine

## 2018-09-01 DIAGNOSIS — L821 Other seborrheic keratosis: Secondary | ICD-10-CM | POA: Diagnosis not present

## 2018-09-01 DIAGNOSIS — Z85828 Personal history of other malignant neoplasm of skin: Secondary | ICD-10-CM | POA: Diagnosis not present

## 2018-09-01 DIAGNOSIS — C44222 Squamous cell carcinoma of skin of right ear and external auricular canal: Secondary | ICD-10-CM | POA: Diagnosis not present

## 2018-09-01 DIAGNOSIS — D225 Melanocytic nevi of trunk: Secondary | ICD-10-CM | POA: Diagnosis not present

## 2018-09-01 DIAGNOSIS — D692 Other nonthrombocytopenic purpura: Secondary | ICD-10-CM | POA: Diagnosis not present

## 2018-09-01 DIAGNOSIS — L570 Actinic keratosis: Secondary | ICD-10-CM | POA: Diagnosis not present

## 2018-09-01 DIAGNOSIS — D1801 Hemangioma of skin and subcutaneous tissue: Secondary | ICD-10-CM | POA: Diagnosis not present

## 2018-09-14 DIAGNOSIS — Z85828 Personal history of other malignant neoplasm of skin: Secondary | ICD-10-CM | POA: Diagnosis not present

## 2018-09-14 DIAGNOSIS — C44222 Squamous cell carcinoma of skin of right ear and external auricular canal: Secondary | ICD-10-CM | POA: Diagnosis not present

## 2018-09-27 DIAGNOSIS — E538 Deficiency of other specified B group vitamins: Secondary | ICD-10-CM | POA: Diagnosis not present

## 2018-10-10 ENCOUNTER — Other Ambulatory Visit: Payer: Self-pay

## 2018-10-10 ENCOUNTER — Encounter: Payer: Self-pay | Admitting: Internal Medicine

## 2018-10-10 ENCOUNTER — Other Ambulatory Visit: Payer: Self-pay | Admitting: Internal Medicine

## 2018-10-10 ENCOUNTER — Ambulatory Visit (INDEPENDENT_AMBULATORY_CARE_PROVIDER_SITE_OTHER): Payer: Medicare Other | Admitting: Internal Medicine

## 2018-10-10 VITALS — BP 122/74 | HR 87 | Ht 67.0 in | Wt 168.0 lb

## 2018-10-10 DIAGNOSIS — I350 Nonrheumatic aortic (valve) stenosis: Secondary | ICD-10-CM

## 2018-10-10 DIAGNOSIS — E782 Mixed hyperlipidemia: Secondary | ICD-10-CM

## 2018-10-10 DIAGNOSIS — I10 Essential (primary) hypertension: Secondary | ICD-10-CM | POA: Diagnosis not present

## 2018-10-10 DIAGNOSIS — E119 Type 2 diabetes mellitus without complications: Secondary | ICD-10-CM | POA: Diagnosis not present

## 2018-10-10 NOTE — Progress Notes (Signed)
OFFICE NOTE  Chief Complaint:  No complaints  Primary Care Physician: [name], [name], MD  HPI:  Daryl Webb is a 73 y.o. male followed by Dr. Mare Ferrari in the past for hypertension, dyslipidemia, type 2 diabetes and mild aortic sclerosis. His last echocardiogram was in 2015 which showed normal LV function and mild aortic sclerosis/borderline stenosis. Recent lab work was performed by myself indicating hemoglobin A1c of 6.7, creatinine 1.4 however he had a recent UTI, and cholesterol profile which was favorable with LDL 70 and total cholesterol 123. He is on atorvastatin 40 mg daily. He has no history of known coronary artery disease.  02/12/2016  Daryl Webb returns today for follow-up. He recently established a new primary care provider with Dr. Betty [name]. She performs some lab work including cholesterol which appears to be well-controlled with total cholesterol 113, LDL 58, triglycerides 130 and HDL of 29, which is increased from 24 in May 2017. He denies any new symptoms of chest pain or worsening shortness of breath. He does have a early peaking systolic murmur suggestive of mild aortic stenosis. This was last seen on echo in 2015. He is also describing pain in the left flank which radiates into the left groin. He was treated for possible UTI/prostatitis and had improvement in some urinary symptoms but has persistent pain. A CT scan was performed recently, which was essentially unremarkable. He reports his pain is worse when rising from either sitting or lying position up to standing and with walking. This could suggest some compression perhaps of the thoracic or lumbar spine or even symptoms suggestive of a possible hernia.   08/18/2016  Daryl Webb is seen today in follow-up. Overall he seems to be doing pretty well. He had an echocardiogram in January 2018 which now shows moderate aortic stenosis. This is worsened since his prior study in 2015. He has type 2 diabetes and  A1c is 6.7. Blood pressure is well-controlled today 122/60. Recent lipid profile showed an LDL-C of 58, which is at goal. He's asymptomatic, denies any chest pain or worsening shortness of breath. He does do some walking although says he can exercise more frequently.  02/23/2017  Daryl Webb was seen today in follow-up.  He had a recent echocardiogram which showed mild worsening of his aortic stenosis.  His mean gradient went from 22-24 mmHg.  This is still moderate left ear.  He has generally good blood pressure control.  Initially blood pressure is elevated today 142/86 however came down to 124/78.  His diabetes has been fairly well controlled.  He is followed by Dr. Buddy Duty for this.  Recent lipid profile showed an LDL-C of 58 on high intensity atorvastatin.  10/10/2018  Daryl Webb is seen today in follow-up.  Overall he is doing well.  He had a repeat echo in January of this year which showed slight worsening of his aortic stenosis.  His mean aortic gradient is increased from 24 to 30 mmHg putting it in the moderate to severe stenosis area.  He denies any chest pain or worsening shortness of breath.  He has had no syncopal events.  Diabetes is well controlled and he is followed by Dr. Buddy Duty.  His blood pressure is at goal today.  His lipids have been well managed.  His EKG shows sinus rhythm with some PACs today at 87.  PMHx:  Past Medical History:  Diagnosis Date  . Anemia   . Arthritis   . Constipation due to pain medication  needs stool softner while on pain medication  . Diabetes mellitus   . Duodenal ulcer 08/13/2015   2014  - Dr Kaplan/GI  . H/O: osteoarthritis   . History of prostate cancer   . History of urethral stricture   . Hypercholesterolemia   . Hypertension   . Ulcer duodenal hemorrhage     Past Surgical History:  Procedure Laterality Date  . ARTHROPLASTY  2004   right knee Dr. Wynelle Link  . CARPAL TUNNEL RELEASE     right hand,wrist [name]  . CYSTOSCOPY N/A  09/20/2012   Procedure: CYSTOSCOPY;  Surgeon: [name], MD;  Location: [facility];  Service: Urology;  Laterality: N/A;  . JOINT REPLACEMENT Right   . KNEE ARTHROSCOPY Right   . LUMBAR LAMINECTOMY  02/08/2013  . PROSTATECTOMY     radical  . VASECTOMY  1985    FAMHx:  Family History  Problem Relation Age of Onset  . Heart disease Mother   . Heart failure Mother   . Heart disease Father   . Diabetes Father     SOCHx:   reports that he quit smoking about 38 years ago. His smoking use included cigarettes. He has a 30.00 pack-year smoking history. He has never used smokeless tobacco. He reports that he does not drink alcohol or use drugs.  ALLERGIES:  No Known Allergies  ROS: Pertinent items noted in HPI and remainder of comprehensive ROS otherwise negative.  HOME MEDS: Current Outpatient Medications  Medication Sig Dispense Refill  . amLODipine (NORVASC) 5 MG tablet TAKE 1 TABLET DAILY 90 tablet 3  . amoxicillin (AMOXIL) 500 MG capsule TAKE 4 CAPSULES 1 HOUR PRIOR TO APPT  2  . aspirin 81 MG tablet Take 81 mg by mouth daily.    . Aspirin-Calcium Carbonate 81-777 MG TABS Take by mouth.    Marland Kitchen atorvastatin (LIPITOR) 40 MG tablet TAKE 1 TABLET DAILY 90 tablet 3  . Cholecalciferol 2000 UNITS CAPS Take 1 capsule by mouth daily. VITAMIN D 3    . Coenzyme Q10 (COQ10) 100 MG CAPS Take 100 mg by mouth daily.    . cyanocobalamin (,VITAMIN B-12,) 1000 MCG/ML injection Inject into the muscle every 30 (thirty) days.     . Exenatide ER (BYDUREON) 2 MG PEN Inject 1 Dose into the skin once a week.    . loratadine (CLARITIN) 10 MG tablet Take 5 mg by mouth daily. As needed for allergies    . losartan-hydrochlorothiazide (HYZAAR) 50-12.5 MG tablet TAKE 1 TABLET DAILY 90 tablet 3  . metFORMIN (GLUCOPHAGE) 1000 MG tablet Take 1 tablet (1,000 mg total) by mouth 2 (two) times daily with a meal. 180 tablet 3  . metoprolol succinate (TOPROL-XL) 50 MG 24 hr tablet TAKE 1 TABLET DAILY.       PLEASE  KEEP UPCOMING       APPOINTMENT FOR FUTURE     REFILLS. 90 tablet 0  . Omega-3 Fatty Acids (FISH OIL) 1000 MG CAPS Take 1,000 mg by mouth daily.    Marland Kitchen Zoster Vaccine Adjuvanted [facility]) injection 0.5 ml in muscle and repeat in 8 weeks 0.5 mL 1  . diclofenac (VOLTAREN) 75 MG EC tablet Take 1 tablet (75 mg total) by mouth daily as needed. (Patient not taking: Reported on 10/10/2018) 30 tablet 2   No current facility-administered medications for this visit.     LABS/IMAGING: No results found for this or any previous visit (from the past 48 hour(s)). No results found.  WEIGHTS: Wt Readings  from Last 3 Encounters:  10/10/18 168 lb (76.2 kg)  07/17/18 172 lb 4 oz (78.1 kg)  02/20/18 173 lb (78.5 kg)    VITALS: BP 122/74   Pulse 87   Ht 5\' 7"  (1.702 m)   Wt 168 lb (76.2 kg)   BMI 26.31 kg/m   EXAM: General appearance: alert and no distress Neck: no carotid bruit, no JVD and thyroid not enlarged, symmetric, no tenderness/mass/nodules Lungs: clear to auscultation bilaterally Heart: regular rate and rhythm, S1, S2 normal and systolic murmur: Midsystolic 3/6, crescendo at 2nd right intercostal space Abdomen: soft, non-tender; bowel sounds normal; no masses,  no organomegaly Extremities: extremities normal, atraumatic, no cyanosis or edema Pulses: 2+ and symmetric Skin: Skin color, texture, turgor normal. No rashes or lesions Neurologic: Grossly normal Psych: Pleasant  EKG: Sinus rhythm with PACs and PVCs at 87, nonspecific T wave changes-personally reviewed  ASSESSMENT: 1. Hypertension-at goal 2. Dyslipidemia-at goal LDL<70 (52) 3. Diabetes type 2-A1c 7.0, followed by Dr. Buddy Duty 4. Moderate aortic stenosis - mean gradient 30 mmHg (02/2018)  PLAN: 1.   Daryl Webb has had interval worsening of his aortic stenosis which is now demonstrating a mean gradient of 30 mmHg.  He will need a repeat echo in January 2021.  His diabetes has been well controlled.  Cholesterol is at goal LDL  less than 70.  His blood pressure is also adequately treated.  Follow-up with me in 6 months or sooner as necessary.  [name], MD, [facility], [place] Director of the Advanced Lipid Disorders &  Cardiovascular Risk Reduction Clinic Diplomate of the American Board of Clinical Lipidology Attending Cardiologist  Direct Dial: [phone]  Fax: [phone]  Website:  [email or web address] 10/10/2018, 10:31 AM

## 2018-10-10 NOTE — Patient Instructions (Signed)
Medication Instructions:  Your physician recommends that you continue on your current medications as directed. Please refer to the Current Medication list given to you today.  If you need a refill on your cardiac medications before your next appointment, please call your pharmacy.   Testing/Procedures: Your physician has requested that you have an echocardiogram. Echocardiography is a painless test that uses sound waves to create images of your heart. It provides your doctor with information about the size and shape of your heart and how well your heart's chambers and valves are working. This procedure takes approximately one hour. There are no restrictions for this procedure. -- due January 2021  Follow-Up: At [facility], you and your health needs are our priority.  As part of our continuing mission to provide you with exceptional heart care, we have created designated Provider Care Teams.  These Care Teams include your primary Cardiologist (physician) and Advanced Practice Providers (APPs -  Physician Assistants and Nurse Practitioners) who all work together to provide you with the care you need, when you need it. You will need a follow up appointment in 6 months.  Please call our office 2 months in advance to schedule this appointment.  You may see [name], MD or one of the following Advanced Practice Providers on your designated Care Team: [place], [place] . Fabian Sharp, PA-C  Any Other Special Instructions Will Be Listed Below (If Applicable).

## 2018-10-30 DIAGNOSIS — E538 Deficiency of other specified B group vitamins: Secondary | ICD-10-CM | POA: Diagnosis not present

## 2018-11-03 DIAGNOSIS — Z23 Encounter for immunization: Secondary | ICD-10-CM | POA: Diagnosis not present

## 2018-12-04 DIAGNOSIS — Z5181 Encounter for therapeutic drug level monitoring: Secondary | ICD-10-CM | POA: Diagnosis not present

## 2018-12-04 DIAGNOSIS — E538 Deficiency of other specified B group vitamins: Secondary | ICD-10-CM | POA: Diagnosis not present

## 2018-12-04 DIAGNOSIS — N1831 Chronic kidney disease, stage 3a: Secondary | ICD-10-CM | POA: Diagnosis not present

## 2018-12-04 DIAGNOSIS — E119 Type 2 diabetes mellitus without complications: Secondary | ICD-10-CM | POA: Diagnosis not present

## 2018-12-18 ENCOUNTER — Other Ambulatory Visit: Payer: Self-pay

## 2018-12-18 ENCOUNTER — Encounter: Payer: Self-pay | Admitting: Family Medicine

## 2018-12-18 ENCOUNTER — Ambulatory Visit (INDEPENDENT_AMBULATORY_CARE_PROVIDER_SITE_OTHER): Payer: Medicare Other | Admitting: Family Medicine

## 2018-12-18 VITALS — BP 128/70 | HR 82 | Temp 97.8°F | Resp 16 | Ht 67.0 in | Wt 171.0 lb

## 2018-12-18 DIAGNOSIS — M159 Polyosteoarthritis, unspecified: Secondary | ICD-10-CM | POA: Diagnosis not present

## 2018-12-18 DIAGNOSIS — I10 Essential (primary) hypertension: Secondary | ICD-10-CM

## 2018-12-18 DIAGNOSIS — N1832 Chronic kidney disease, stage 3b: Secondary | ICD-10-CM | POA: Diagnosis not present

## 2018-12-18 LAB — BASIC METABOLIC PANEL
BUN: 19 mg/dL (ref 6–23)
CO2: 24 mEq/L (ref 19–32)
Calcium: 9.4 mg/dL (ref 8.4–10.5)
Chloride: 105 mEq/L (ref 96–112)
Creatinine, Ser: 1.36 mg/dL (ref 0.40–1.50)
GFR: 51.26 mL/min — ABNORMAL LOW (ref >60.00)
Glucose, Bld: 139 mg/dL — ABNORMAL HIGH (ref 70–99)
Potassium: 4.3 mEq/L (ref 3.5–5.1)
Sodium: 137 mEq/L (ref 135–145)

## 2018-12-18 NOTE — Progress Notes (Signed)
HPI:   Daryl Webb is a 73 y.o. male, who is here today for 5 months follow up.   He was last seen on 07/17/18. Since his last visit he has seen his endocrinologist,cardiologist,and dermatologist [facility]).  HTN: He is not checking BP's at home. Denies severe/frequent headache, visual changes, chest pain, dyspnea, palpitation, claudication, focal weakness, or edema.  Currently he is on losartan-HCTZ 50-12.5 mg daily, amlodipine 5 mg daily, and metoprolol succinate 50 mg daily. He had labs at his endocrinologist's office, Ca++ mildly elevated on 08/29/18 at 10.44. Cr 1.6 and eGFR 43%. He has not noted gross hematuria,decreased urine output,or foam in urine.  Lab Results  Component Value Date   CHOL 94 06/28/2017   HDL 27.90 (L) 06/28/2017   LDLCALC 43 06/28/2017   LDLDIRECT 73.8 11/20/2012   TRIG 112.0 06/28/2017   CHOLHDL 3 06/28/2017    HgA1C 6.7 on 12/04/18.   Generalized OA: He tried Tramadol and he did not feel like it helps. He would like to go back to Diclofenac 75 mg EC daily prn. Pain is exacerbated by prolonged walking and standing. Alleviated by rest.  Pain has not been severe because he is not as active as he was because COVID 19.   Review of Systems  Constitutional: Negative for activity change, appetite change, fatigue and fever.  HENT: Negative for nosebleeds and trouble swallowing.   Eyes: Negative for redness and visual disturbance.  Respiratory: Negative for cough and wheezing.   Gastrointestinal: Negative for abdominal pain, nausea and vomiting.  Genitourinary: Negative for dysuria and flank pain.  Musculoskeletal: Positive for arthralgias and back pain. Negative for gait problem.  Skin: Negative for rash and wound.  Neurological: Negative for dizziness, syncope and facial asymmetry.  Psychiatric/Behavioral: Negative for confusion. The patient is nervous/anxious.   Rest of ROS, see pertinent positives sand negatives in HPI    Current Outpatient Medications on File Prior to Visit  Medication Sig Dispense Refill  . amLODipine (NORVASC) 5 MG tablet TAKE 1 TABLET DAILY 90 tablet 3  . amoxicillin (AMOXIL) 500 MG capsule TAKE 4 CAPSULES 1 HOUR PRIOR TO APPT  2  . aspirin 81 MG tablet Take 81 mg by mouth daily.    . Aspirin-Calcium Carbonate 81-777 MG TABS Take by mouth.    Marland Kitchen atorvastatin (LIPITOR) 40 MG tablet TAKE 1 TABLET DAILY 90 tablet 3  . Cholecalciferol 2000 UNITS CAPS Take 1 capsule by mouth daily. VITAMIN D 3    . Coenzyme Q10 (COQ10) 100 MG CAPS Take 100 mg by mouth daily.    . cyanocobalamin (,VITAMIN B-12,) 1000 MCG/ML injection Inject into the muscle every 30 (thirty) days.     . diclofenac (VOLTAREN) 75 MG EC tablet Take 1 tablet (75 mg total) by mouth daily as needed. 30 tablet 2  . Exenatide ER (BYDUREON) 2 MG PEN Inject 1 Dose into the skin once a week.    . loratadine (CLARITIN) 10 MG tablet Take 5 mg by mouth daily. As needed for allergies    . losartan-hydrochlorothiazide (HYZAAR) 50-12.5 MG tablet TAKE 1 TABLET DAILY 90 tablet 3  . metFORMIN (GLUCOPHAGE) 1000 MG tablet Take 1 tablet (1,000 mg total) by mouth 2 (two) times daily with a meal. 180 tablet 3  . metoprolol succinate (TOPROL-XL) 50 MG 24 hr tablet Take 1 tablet (50 mg total) by mouth daily. 90 tablet 1  . Omega-3 Fatty Acids (FISH OIL) 1000 MG CAPS Take 1,000 mg by mouth daily.    Marland Kitchen  Zoster Vaccine Adjuvanted [facility]) injection 0.5 ml in muscle and repeat in 8 weeks 0.5 mL 1   No current facility-administered medications on file prior to visit.     Past Medical History:  Diagnosis Date  . Anemia   . Arthritis   . Constipation due to pain medication    needs stool softner while on pain medication  . Diabetes mellitus   . Duodenal ulcer 08/13/2015   2014  - Dr Kaplan/GI  . H/O: osteoarthritis   . History of prostate cancer   . History of urethral stricture   . Hypercholesterolemia   . Hypertension   . Ulcer duodenal hemorrhage     No Known Allergies  Social History   Socioeconomic History  . Marital status: Married    Spouse name: Not on file  . Number of children: 2  . Years of education: Not on file  . Highest education level: Not on file  Occupational History  . Occupation: Retired  Scientific laboratory technician  . Financial resource strain: Not hard at all  . Food insecurity    Worry: Never true    Inability: Never true  . Transportation needs    Medical: No    Non-medical: No  Tobacco Use  . Smoking status: Former Smoker    Packs/day: 1.50    Years: 20.00    Pack years: 30.00    Types: Cigarettes    Quit date: 02/09/1980    Years since quitting: 38.8  . Smokeless tobacco: Never Used  Substance and Sexual Activity  . Alcohol use: No  . Drug use: No  . Sexual activity: Not Currently  Lifestyle  . Physical activity    Days per week: 0 days    Minutes per session: 0 min  . Stress: Not at all  Relationships  . Social Herbalist on phone: Twice a week    Gets together: Twice a week    Attends religious service: Never    Active member of club or organization: No    Attends meetings of clubs or organizations: Never    Relationship status: Married  Other Topics Concern  . 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 Lives with wife, two children in [place]. Enjoys reading, boating, travelling to [place] in the winter with wife.    Vitals:   12/18/18 1005  BP: 128/70  Pulse: 82  Resp: 16  Temp: 97.8 F (36.6 C)  SpO2: 98%   Body mass index is 26.78 kg/m.   Wt Readings from Last 3 Encounters:  12/18/18 171 lb (77.6 kg)  10/10/18 168 lb (76.2 kg)  07/17/18 172 lb 4 oz (78.1 kg)    Physical Exam  Nursing note reviewed. Constitutional: He is oriented to person, place, and time. He appears well-developed. No distress.  HENT:  Head: Normocephalic and atraumatic.  Mouth/Throat: Oropharynx is clear and moist and mucous membranes are normal.  Eyes: Pupils  are equal, round, and reactive to light. Conjunctivae are normal.  Cardiovascular: Normal rate and regular rhythm.  Murmur (SEM I-II/VI RUSB) heard. Pulses:      Dorsalis pedis pulses are 2+ on the right side and 2+ on the left side.  Respiratory: Effort normal and breath sounds normal. No respiratory distress.  GI: Soft. He exhibits no mass. There is no hepatomegaly. There is no abdominal tenderness.  Musculoskeletal:        General: Edema (Trace pitting LE edema,bilateral.) present.  Thoracic back: He exhibits no tenderness and no bony tenderness.     Lumbar back: He exhibits no tenderness and no bony tenderness.  Lymphadenopathy:    He has no cervical adenopathy.  Neurological: He is alert and oriented to person, place, and time. He has normal strength. No cranial nerve deficit.  Otherwise stable gait,untalgic,not assisted.  Skin: Skin is warm. No rash noted. No erythema.  Psychiatric: His mood appears anxious. Cognition and memory are normal.  Well groomed, good eye contact.      ASSESSMENT AND PLAN:   Daryl Webb was seen today for 5 months follow-up.  Orders Placed This Encounter  Procedures  . Basic Metabolic Panel   Lab Results  Component Value Date   CREATININE 1.36 12/18/2018   BUN 19 12/18/2018   NA 137 12/18/2018   K 4.3 12/18/2018   CL 105 12/18/2018   CO2 24 12/18/2018    Essential hypertension BP adequately controlled. Continue low-salt diet. No changes in current management. Recommend monitoring BP regularly.  Generalized osteoarthritis of multiple sites He did not feel like tramadol help with joint pain, he is also afraid of being on this type of medication for long-term. CKD and hx of PUD. We again reviewed side effects of NSAIDs, he would like to continue he feels like he benefits more from the medication vs the risk. He is not interested in other options like opioids or SNRIs. We could consider changing diclofenac for naproxen or  ibuprofen. Low impact physical activity.   CKD (chronic kidney disease), stage III ([place]) Further recommendation will be given according to lab results. Adequate glucose and BP control. Continue low-salt diet. He would like to continue taking NSAIDs for OA pain, he understands side effects. We will continue monitoring renal function periodically. Continue Losartan 50 mg daily.    Return in about 5 months (around 05/18/2019) for HTN,CKD.   -Daryl Webb was advised to return sooner than planned today if new concerns arise.       Daryl Sjogren G. [name], MD  [facility]. [place] office.

## 2018-12-18 NOTE — Assessment & Plan Note (Addendum)
He did not feel like tramadol help with joint pain, he is also afraid of being on this type of medication for long-term. CKD and hx of PUD. We again reviewed side effects of NSAIDs, he would like to continue he feels like he benefits more from the medication vs the risk. He is not interested in other options like opioids or SNRIs. We could consider changing diclofenac for naproxen or ibuprofen. Low impact physical activity.

## 2018-12-18 NOTE — Assessment & Plan Note (Addendum)
BP adequately controlled. Continue low-salt diet. No changes in current management. Recommend monitoring BP regularly.

## 2018-12-18 NOTE — Patient Instructions (Signed)
A few things to remember from today's visit:   Essential hypertension - Plan: Basic Metabolic Panel  Stage 3a chronic kidney disease  Generalized osteoarthritis of multiple sites  Mixed hyperlipidemia  No changes today. We will continue monitoring kidney function. Be careful with falls. Low impact exercise.  Please be sure medication list is accurate. If a new problem present, please set up appointment sooner than planned today.

## 2018-12-18 NOTE — Assessment & Plan Note (Signed)
Further recommendation will be given according to lab results. Adequate glucose and BP control. Continue low-salt diet. He would like to continue taking NSAIDs for OA pain, he understands side effects. We will continue monitoring renal function periodically. Continue Losartan 50 mg daily.

## 2018-12-26 ENCOUNTER — Telehealth: Payer: Self-pay

## 2018-12-26 ENCOUNTER — Other Ambulatory Visit: Payer: Self-pay | Admitting: Family Medicine

## 2018-12-26 DIAGNOSIS — M159 Polyosteoarthritis, unspecified: Secondary | ICD-10-CM

## 2018-12-26 MED ORDER — DICLOFENAC SODIUM 75 MG PO TBEC: 75.0000 mg | DELAYED_RELEASE_TABLET | Freq: Every day | ORAL | 2 refills | Status: DC | PRN

## 2018-12-26 NOTE — Telephone Encounter (Signed)
Copied from [place] [id number]. Topic: General - Inquiry >> Dec 26, 2018 10:38 AM Rutherford Nail, NT wrote: Reason for CRM: See Result notes from 12/18/2018:  Patient wants verification if he can start back on the medication Diclofenac [phone] may leave a massage on answering machine yes may restart medication or no may not.  Please advise.

## 2018-12-26 NOTE — Telephone Encounter (Signed)
[  Renal function seems to be stable. We already discussed adverse effects of chronic use of NSAIDs. He wishes to continue with diclofenac, we will continue monitoring renal function.]  Prescription for diclofenac 75 mg was sent to his pharmacy to continue taking once daily as needed. Thanks, BJ

## 2018-12-27 NOTE — Telephone Encounter (Signed)
Patient is aware 

## 2019-01-01 DIAGNOSIS — H5203 Hypermetropia, bilateral: Secondary | ICD-10-CM | POA: Diagnosis not present

## 2019-01-01 DIAGNOSIS — E119 Type 2 diabetes mellitus without complications: Secondary | ICD-10-CM | POA: Diagnosis not present

## 2019-01-01 DIAGNOSIS — H52203 Unspecified astigmatism, bilateral: Secondary | ICD-10-CM | POA: Diagnosis not present

## 2019-01-01 DIAGNOSIS — H25813 Combined forms of age-related cataract, bilateral: Secondary | ICD-10-CM | POA: Diagnosis not present

## 2019-01-01 DIAGNOSIS — H524 Presbyopia: Secondary | ICD-10-CM | POA: Diagnosis not present

## 2019-01-08 DIAGNOSIS — E538 Deficiency of other specified B group vitamins: Secondary | ICD-10-CM | POA: Diagnosis not present

## 2019-02-04 ENCOUNTER — Other Ambulatory Visit: Payer: Self-pay | Admitting: Internal Medicine

## 2019-02-05 ENCOUNTER — Other Ambulatory Visit: Payer: Self-pay | Admitting: *Deleted

## 2019-02-06 ENCOUNTER — Other Ambulatory Visit: Payer: Self-pay

## 2019-02-06 MED ORDER — AMLODIPINE BESYLATE 5 MG PO TABS: 5.0000 mg | ORAL_TABLET | Freq: Every day | ORAL | 1 refills | Status: DC

## 2019-02-12 ENCOUNTER — Ambulatory Visit (HOSPITAL_COMMUNITY): Payer: Medicare Other | Attending: Internal Medicine

## 2019-02-12 ENCOUNTER — Other Ambulatory Visit: Payer: Self-pay

## 2019-02-12 DIAGNOSIS — I350 Nonrheumatic aortic (valve) stenosis: Secondary | ICD-10-CM | POA: Diagnosis not present

## 2019-02-23 ENCOUNTER — Other Ambulatory Visit: Payer: Self-pay

## 2019-02-26 ENCOUNTER — Ambulatory Visit: Payer: Medicare Other

## 2019-03-07 DIAGNOSIS — D2262 Melanocytic nevi of left upper limb, including shoulder: Secondary | ICD-10-CM | POA: Diagnosis not present

## 2019-03-07 DIAGNOSIS — L821 Other seborrheic keratosis: Secondary | ICD-10-CM | POA: Diagnosis not present

## 2019-03-07 DIAGNOSIS — D225 Melanocytic nevi of trunk: Secondary | ICD-10-CM | POA: Diagnosis not present

## 2019-03-07 DIAGNOSIS — L814 Other melanin hyperpigmentation: Secondary | ICD-10-CM | POA: Diagnosis not present

## 2019-03-07 DIAGNOSIS — L570 Actinic keratosis: Secondary | ICD-10-CM | POA: Diagnosis not present

## 2019-03-07 DIAGNOSIS — Z85828 Personal history of other malignant neoplasm of skin: Secondary | ICD-10-CM | POA: Diagnosis not present

## 2019-03-07 DIAGNOSIS — D0461 Carcinoma in situ of skin of right upper limb, including shoulder: Secondary | ICD-10-CM | POA: Diagnosis not present

## 2019-03-07 DIAGNOSIS — D2261 Melanocytic nevi of right upper limb, including shoulder: Secondary | ICD-10-CM | POA: Diagnosis not present

## 2019-03-07 DIAGNOSIS — D1801 Hemangioma of skin and subcutaneous tissue: Secondary | ICD-10-CM | POA: Diagnosis not present

## 2019-03-13 ENCOUNTER — Other Ambulatory Visit: Payer: Self-pay

## 2019-03-13 ENCOUNTER — Telehealth: Payer: Self-pay

## 2019-03-13 ENCOUNTER — Ambulatory Visit (INDEPENDENT_AMBULATORY_CARE_PROVIDER_SITE_OTHER): Payer: Medicare Other

## 2019-03-13 VITALS — BP 126/76 | Temp 97.4°F | Ht 67.0 in | Wt 177.4 lb

## 2019-03-13 DIAGNOSIS — Z0000 Encounter for general adult medical examination without abnormal findings: Secondary | ICD-10-CM | POA: Diagnosis not present

## 2019-03-13 DIAGNOSIS — M159 Polyosteoarthritis, unspecified: Secondary | ICD-10-CM

## 2019-03-13 MED ORDER — DICLOFENAC SODIUM 75 MG PO TBEC: 75.0000 mg | DELAYED_RELEASE_TABLET | Freq: Every day | ORAL | 2 refills | Status: DC | PRN

## 2019-03-13 NOTE — Telephone Encounter (Signed)
-----   Message from [place], [place] sent at 03/13/2019  1:58 PM EST ----- Regarding: FW: refill diclofenac  ----- Message ----- From: Christiane Ha, LPN Sent: [id number]   1:55 PM EST To: Grayling Congress, CMA Subject: refill diclofenac                              Patient is requesting 90 day supply of diclofenac be sent to [facility] please.

## 2019-03-13 NOTE — Telephone Encounter (Signed)
ote routed to me in error

## 2019-03-13 NOTE — Telephone Encounter (Signed)
Rx sent in

## 2019-03-13 NOTE — Progress Notes (Signed)
Subjective:   Daryl Webb is a 74 y.o. male who presents for Medicare Annual/Subsequent preventive examination.  Daryl Webb is doing well at this time. He states he knows he is not getting an adequate amount of physical exercise. He does most of the cooking at home and consistently is eating 3 balanced, healthy meals per day. He would like to be spending his winter in [place] in his RV but has not done that due to the pandemic.   Review of Systems:  No ROS; Annual Medicare Wellness subsequent visit.  Cardiac Risk Factors include: advanced age (>73men, >87 women);diabetes mellitus;dyslipidemia;hypertension;male gender     Objective:    Vitals: BP 126/76 (BP Location: Left Arm, Patient Position: Sitting)   Temp (!) 97.4 F (36.3 C) (Temporal)   Ht 5\' 7"  (1.702 m)   Wt 177 lb 6.4 oz (80.5 kg)   BMI 27.78 kg/m   Body mass index is 27.78 kg/m.  Advanced Directives 03/13/2019 02/20/2018 02/15/2017 04/17/2014 09/12/2012  Does Patient Have a Medical Advance Directive? Yes Yes Yes Yes Patient has advance directive, copy in chart  Type of Advance Directive [place];Living will [place];Living will - [place];Living will [place];Living will  Does patient want to make changes to medical advance directive? No - Patient declined No - Patient declined - - -  Copy of [place] in Chart? No - copy requested No - copy requested - Yes -    Tobacco Social History   Tobacco Use  Smoking Status Former Smoker  . Packs/day: 1.50  . Years: 20.00  . Pack years: 30.00  . Types: Cigarettes  . Quit date: 02/09/1980  . Years since quitting: 39.1  Smokeless Tobacco Never Used     Counseling given: Not Answered   Clinical Intake:  Pre-visit preparation completed: Yes  Pain : No/denies pain     BMI - recorded: 27.78 Nutritional Status: BMI 25 -29 Overweight Nutritional Risks: None Diabetes:  Yes CBG done?: No Did pt. bring in CBG monitor from home?: No(patient reports that he checks his glucose when he has eaten something "bad". He states his endocrinologist told him 80-180 is his targeted range)  How often do you need to have someone help you when you read instructions, pamphlets, or other written materials from your doctor or pharmacy?: 1 - Never  Interpreter Needed?: No  Information entered by :: Franne Forts, LPN.  Past Medical History:  Diagnosis Date  . Anemia   . Arthritis   . Constipation due to pain medication    needs stool softner while on pain medication  . Diabetes mellitus   . Duodenal ulcer 08/13/2015   2014  - Dr Kaplan/GI  . H/O: osteoarthritis   . History of prostate cancer   . History of urethral stricture   . Hypercholesterolemia   . Hypertension   . Ulcer duodenal hemorrhage    Past Surgical History:  Procedure Laterality Date  . ARTHROPLASTY  2004   right knee Dr. Wynelle Link  . CARPAL TUNNEL RELEASE     right hand,wrist [name]  . CYSTOSCOPY N/A 09/20/2012   Procedure: CYSTOSCOPY;  Surgeon: [name], MD;  Location: [facility];  Service: Urology;  Laterality: N/A;  . JOINT REPLACEMENT Right   . KNEE ARTHROSCOPY Right   . LUMBAR LAMINECTOMY  02/08/2013  . PROSTATECTOMY     radical  . VASECTOMY  1985   Family History  Problem  Relation Age of Onset  . Heart disease Mother   . Heart failure Mother   . Heart disease Father   . Diabetes Father    Social History   Socioeconomic History  . Marital status: Married    Spouse name: Not on file  . Number of children: 2  . Years of education: Not on file  . Highest education level: Bachelor's degree (e.g., BA, AB, BS)  Occupational History  . Occupation: Retired  Tobacco Use  . Smoking status: Former Smoker    Packs/day: 1.50    Years: 20.00    Pack years: 30.00    Types: Cigarettes    Quit date: 02/09/1980    Years since quitting: 39.1  . Smokeless tobacco: Never Used   Substance and Sexual Activity  . Alcohol use: No  . Drug use: No  . Sexual activity: Not Currently  Other Topics Concern  . 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    Lives with wife,    two children in [place].    Enjoys reading, boating, travelling to [place] in the winter with wife.   Social Determinants of Health   Financial Resource Strain: Low Risk   . Difficulty of Paying Living Expenses: Not hard at all  Food Insecurity: No Food Insecurity  . Worried About Charity fundraiser in the Last Year: Never true  . Ran Out of Food in the Last Year: Never true  Transportation Needs: No Transportation Needs  . Lack of Transportation (Medical): No  . Lack of Transportation (Non-Medical): No  Physical Activity:   . Days of Exercise per Week: Not on file  . Minutes of Exercise per Session: Not on file  Stress: No Stress Concern Present  . Feeling of Stress : Only a little  Social Connections: Unknown  . Frequency of Communication with Friends and Family: Not on file  . Frequency of Social Gatherings with Friends and Family: Not on file  . Attends Religious Services: Not on file  . Active Member of Clubs or Organizations: Not on file  . Attends Archivist Meetings: Not on file  . Marital Status: Married    Outpatient Encounter Medications as of 03/13/2019  Medication Sig  . amLODipine (NORVASC) 5 MG tablet Take 1 tablet (5 mg total) by mouth daily.  Marland Kitchen amoxicillin (AMOXIL) 500 MG capsule TAKE 4 CAPSULES 1 HOUR PRIOR TO APPT  . aspirin 81 MG tablet Take 81 mg by mouth daily.  Marland Kitchen atorvastatin (LIPITOR) 40 MG tablet TAKE 1 TABLET DAILY  . Cholecalciferol 2000 UNITS CAPS Take 1 capsule by mouth daily. VITAMIN D 3  . Coenzyme Q10 (COQ10) 100 MG CAPS Take 100 mg by mouth daily.  . cyanocobalamin (,VITAMIN B-12,) 1000 MCG/ML injection Inject into the muscle every 30 (thirty) days.   . diclofenac (VOLTAREN) 75 MG EC tablet Take 1 tablet (75  mg total) by mouth daily as needed.  . Exenatide ER (BYDUREON) 2 MG PEN Inject 1 Dose into the skin once a week.  . loratadine (CLARITIN) 10 MG tablet Take 5 mg by mouth daily. As needed for allergies  . losartan-hydrochlorothiazide (HYZAAR) 50-12.5 MG tablet TAKE 1 TABLET DAILY  . metFORMIN (GLUCOPHAGE) 1000 MG tablet Take 1 tablet (1,000 mg total) by mouth 2 (two) times daily with a meal.  . metoprolol succinate (TOPROL-XL) 50 MG 24 hr tablet Take 1 tablet (50 mg total) by mouth daily.  . Omega-3 Fatty Acids (  FISH OIL) 1000 MG CAPS Take 1,000 mg by mouth daily.  . Aspirin-Calcium Carbonate 81-777 MG TABS Take by mouth.  . [DISCONTINUED] Zoster Vaccine Adjuvanted [facility]) injection 0.5 ml in muscle and repeat in 8 weeks   No facility-administered encounter medications on file as of 03/13/2019.    Activities of Daily Living In your present state of health, do you have any difficulty performing the following activities: 03/13/2019  Hearing? N  Vision? N  Difficulty concentrating or making decisions? N  Walking or climbing stairs? N  Dressing or bathing? N  Doing errands, shopping? N  Preparing Food and eating ? N  Using the Toilet? N  In the past six months, have you accidently leaked urine? N  Do you have problems with loss of bowel control? N  Managing your Medications? N  Housekeeping or managing your Housekeeping? N  Some recent data might be hidden    Patient Care Team: [name], [name], MD as PCP - General (Family Medicine) Debara Pickett Nadean Corwin, MD as PCP - Cardiology (Cardiology) [name], MD as Consulting Physician (Endocrinology) Rutherford Guys, MD as Consulting Physician (Ophthalmology)   Assessment:   This is a routine wellness examination for Gwyndolyn Saxon.  Exercise Activities and Dietary recommendations Current Exercise Habits: The patient does not participate in regular exercise at present, Exercise limited by: None identified  Goals    . Exercise 150 min/wk Moderate  Activity     Will try to walk more;  Try to walk 1 mile  per day your neighborhood   Also; will look forward to hearing about your new adventure next year Find other hobby !    . Patient Stated     Understand my chronic kidney disease better, know how to improve it.  Continue to increase physical activity, hydration per endocrinologist's recommendations.        Fall Risk Fall Risk  03/13/2019 02/20/2018 02/15/2017 02/15/2017 08/13/2015  Falls in the past year? 0 0 No No No  Number falls in past yr: - 0 - - -  Risk for fall due to : Medication side effect Impaired vision;Medication side effect - - -  Follow up Falls evaluation completed;Education provided;Falls prevention discussed Falls prevention discussed;Education provided - - -   Is the patient's home free of loose throw rugs in walkways, pet beds, electrical cords, etc?   yes      Grab bars in the bathroom? yes      Handrails on the stairs?   yes      Adequate lighting?   yes  Timed Get Up and Go Performed: yes  Depression Screen PHQ 2/9 Scores 03/13/2019 02/20/2018 02/15/2017 02/15/2017  PHQ - 2 Score 0 0 0 0    Cognitive Function MMSE - Mini Mental State Exam 02/20/2018 02/15/2017  Not completed: - (No Data)  Orientation to time 5 -  Orientation to Place 5 -  Registration 3 -  Attention/ Calculation 5 -  Recall 3 -  Language- name 2 objects 2 -  Language- repeat 1 -  Language- follow 3 step command 3 -  Language- read & follow direction 1 -  Write a sentence 1 -  Copy design 1 -  Total score 30 -     6CIT Screen 03/13/2019 02/15/2017  What Year? 0 points 0 points  What month? 0 points 0 points  What time? 0 points 0 points  Count back from 20 0 points 0 points  Months in reverse 0 points 0 points  Repeat phrase 0 points 0 points  Total Score 0 0    Immunization History  Administered Date(s) Administered  . Influenza, High Dose Seasonal PF 12/03/2016, 11/03/2018  . Influenza-Unspecified 01/08/2017  . Pneumococcal  Polysaccharide-23 01/28/2017  . Zoster Recombinat (Shingrix) 09/14/2018, 11/17/2018    Qualifies for Shingles Vaccine? Patient has already completed  Screening Tests Health Maintenance  Topic Date Due  . TETANUS/TDAP  03[birth year]10/1964  . OPHTHALMOLOGY EXAM  01/08/2018  . PNA vac Low Risk Adult (2 of 2 - PCV13) 01/28/2018  . HEMOGLOBIN A1C  05/19/2018  . FOOT EXAM  11/18/2018  . COLONOSCOPY  03/31/2020  . INFLUENZA VACCINE  Completed  . Hepatitis C Screening  Completed   Cancer Screenings: Lung: Low Dose CT Chest recommended if Age 74-80 years, 30 pack-year currently smoking OR have quit w/in 15years. Patient does not qualify. Colorectal: yes  Additional Screenings:  Hepatitis C Screening:completed 07/19/2016     Plan:   Mr. Delconte plans to get an updated Tdap at his next in office appointment with Dr. [name] in April 2021. He is scheduled soon for second covid vaccine and could not get this done today while in the office.  I have personally reviewed and noted the following in the patient's chart:   . Medical and social history . Use of alcohol, tobacco or illicit drugs  . Current medications and supplements . Functional ability and status . Nutritional status . Physical activity . Advanced directives . List of other physicians . Hospitalizations, surgeries, and ER visits in previous 12 months . Vitals . Screenings to include cognitive, depression, and falls . Referrals and appointments  In addition, I have reviewed and discussed with patient certain preventive protocols, quality metrics, and best practice recommendations. A written personalized care plan for preventive services as well as general preventive health recommendations were provided to patient.     Franne Forts, LPN  [id number]

## 2019-03-13 NOTE — Patient Instructions (Addendum)
[name] , Thank you for taking time to come for your Medicare Wellness Visit. I appreciate your ongoing commitment to your health goals. Please review the following plan we discussed and let me know if I can assist you in the future.   Screening recommendations/referrals: Colorectal Screening: completed colonoscopy 03/31/2010. Due again 03/31/2020.  Vision and Dental Exams: Recommended annual ophthalmology exams for early detection of glaucoma and other disorders of the eye.  Recommended annual dental exams for proper oral hygiene. Patient reports he keeps up with these appointments.  Diabetic Exams: Diabetic Eye Exam: completed 01/01/1999 Diabetic Foot Exam: patient reports his endocrinologist checks his feet.  Vaccinations: Influenza vaccine: completed 11/07/2018. Due again in fall 2021. Pneumococcal vaccine: completed 12/25/2013 & 01/28/2017. Up to date.  Tdap vaccine: no documentation on file. Patient will have this done after he complete Shingles vaccine: completed 09/14/2018 & 11/17/2018.   Advanced directives: Advance directives discussed with you today. Please bring a copy of your POA (Power of [place]) and/or Living Will to your next appointment.  Goals: Recommend to drink at least 6-8 8oz glasses of water per day.  Recommend to exercise for at least 150 minutes per week.  Recommend to remove any items from the home that may cause slips or trips.  Recommend to decrease portion sizes by eating 3 small healthy meals and at least 2 healthy snacks per day.  Next appointment: Please schedule your Annual Wellness Visit with your Nurse Health Advisor in one year.  Preventive Care 69 Years and Older, Male Preventive care refers to lifestyle choices and visits with your health care provider that can promote health and wellness. What does preventive care include?  A yearly physical exam. This is also called an annual well check.  Dental exams once or twice a  year.  Routine eye exams. Ask your health care provider how often you should have your eyes checked.  Personal lifestyle choices, including:  Daily care of your teeth and gums.  Regular physical activity.  Eating a healthy diet.  Avoiding tobacco and drug use.  Limiting alcohol use.  Practicing safe sex.  Taking low doses of aspirin every day if recommended by your health care provider..  Taking vitamin and mineral supplements as recommended by your health care provider. What happens during an annual well check? The services and screenings done by your health care provider during your annual well check will depend on your age, overall health, lifestyle risk factors, and family history of disease. Counseling  Your health care provider may ask you questions about your:  Alcohol use.  Tobacco use.  Drug use.  Emotional well-being.  Home and relationship well-being.  Sexual activity.  Eating habits.  History of falls.  Memory and ability to understand (cognition).  Work and work Statistician. Screening  You may have the following tests or measurements:  Height, weight, and BMI.  Blood pressure.  Lipid and cholesterol levels. These may be checked every 5 years, or more frequently if you are over 39 years old.  Skin check.  Lung cancer screening. You may have this screening every year starting at age 74 if you have a 30-pack-year history of smoking and currently smoke or have quit within the past 15 years.  Fecal occult blood test (FOBT) of the stool. You may have this test every year starting at age 38.  Flexible sigmoidoscopy or colonoscopy. You may have a sigmoidoscopy every 5 years or a colonoscopy every 10 years starting at age 43.  Prostate  cancer screening. Recommendations will vary depending on your family history and other risks.  Hepatitis C blood test.  Hepatitis B blood test.  Sexually transmitted disease (STD) testing.  Diabetes screening.  This is done by checking your blood sugar (glucose) after you have not eaten for a while (fasting). You may have this done every 1-3 years.  Abdominal aortic aneurysm (AAA) screening. You may need this if you are a current or former smoker.  Osteoporosis. You may be screened starting at age 52 if you are at high risk. Talk with your health care provider about your test results, treatment options, and if necessary, the need for more tests. Vaccines  Your health care provider may recommend certain vaccines, such as:  Influenza vaccine. This is recommended every year.  Tetanus, diphtheria, and acellular pertussis (Tdap, Td) vaccine. You may need a Td booster every 10 years.  Zoster vaccine. You may need this after age 58.  Pneumococcal 13-valent conjugate (PCV13) vaccine. One dose is recommended after age 51.  Pneumococcal polysaccharide (PPSV23) vaccine. One dose is recommended after age 67. Talk to your health care provider about which screenings and vaccines you need and how often you need them. This information is not intended to replace advice given to you by your health care provider. Make sure you discuss any questions you have with your health care provider. Document Released: 02/21/2015 Document Revised: 10/15/2015 Document Reviewed: 11/26/2014 Elsevier Interactive Patient Education  2017 [place] Prevention in the Home Falls can cause injuries. They can happen to people of all ages. There are many things you can do to make your home safe and to help prevent falls. What can I do on the outside of my home?  Regularly fix the edges of walkways and driveways and fix any cracks.  Remove anything that might make you trip as you walk through a door, such as a raised step or threshold.  Trim any bushes or trees on the path to your home.  Use bright outdoor lighting.  Clear any walking paths of anything that might make someone trip, such as rocks or tools.  Regularly  check to see if handrails are loose or broken. Make sure that both sides of any steps have handrails.  Any raised decks and porches should have guardrails on the edges.  Have any leaves, snow, or ice cleared regularly.  Use sand or salt on walking paths during winter.  Clean up any spills in your garage right away. This includes oil or grease spills. What can I do in the bathroom?  Use night lights.  Install grab bars by the toilet and in the tub and shower. Do not use towel bars as grab bars.  Use non-skid mats or decals in the tub or shower.  If you need to sit down in the shower, use a plastic, non-slip stool.  Keep the floor dry. Clean up any water that spills on the floor as soon as it happens.  Remove soap buildup in the tub or shower regularly.  Attach bath mats securely with double-sided non-slip rug tape.  Do not have throw rugs and other things on the floor that can make you trip. What can I do in the bedroom?  Use night lights.  Make sure that you have a light by your bed that is easy to reach.  Do not use any sheets or blankets that are too big for your bed. They should not hang down onto the floor.  Have a  firm chair that has side arms. You can use this for support while you get dressed.  Do not have throw rugs and other things on the floor that can make you trip. What can I do in the kitchen?  Clean up any spills right away.  Avoid walking on wet floors.  Keep items that you use a lot in easy-to-reach places.  If you need to reach something above you, use a strong step stool that has a grab bar.  Keep electrical cords out of the way.  Do not use floor polish or wax that makes floors slippery. If you must use wax, use non-skid floor wax.  Do not have throw rugs and other things on the floor that can make you trip. What can I do with my stairs?  Do not leave any items on the stairs.  Make sure that there are handrails on both sides of the stairs and  use them. Fix handrails that are broken or loose. Make sure that handrails are as long as the stairways.  Check any carpeting to make sure that it is firmly attached to the stairs. Fix any carpet that is loose or worn.  Avoid having throw rugs at the top or bottom of the stairs. If you do have throw rugs, attach them to the floor with carpet tape.  Make sure that you have a light switch at the top of the stairs and the bottom of the stairs. If you do not have them, ask someone to add them for you. What else can I do to help prevent falls?  Wear shoes that:  Do not have high heels.  Have rubber bottoms.  Are comfortable and fit you well.  Are closed at the toe. Do not wear sandals.  If you use a stepladder:  Make sure that it is fully opened. Do not climb a closed stepladder.  Make sure that both sides of the stepladder are locked into place.  Ask someone to hold it for you, if possible.  Clearly mark and make sure that you can see:  Any grab bars or handrails.  First and last steps.  Where the edge of each step is.  Use tools that help you move around (mobility aids) if they are needed. These include:  Canes.  Walkers.  Scooters.  Crutches.  Turn on the lights when you go into a dark area. Replace any light bulbs as soon as they burn out.  Set up your furniture so you have a clear path. Avoid moving your furniture around.  If any of your floors are uneven, fix them.  If there are any pets around you, be aware of where they are.  Review your medicines with your doctor. Some medicines can make you feel dizzy. This can increase your chance of falling. Ask your doctor what other things that you can do to help prevent falls. This information is not intended to replace advice given to you by your health care provider. Make sure you discuss any questions you have with your health care provider. Document Released: 11/21/2008 Document Revised: 07/03/2015 Document  Reviewed: 03/01/2014 Elsevier Interactive Patient Education  2017 [employer].

## 2019-04-09 ENCOUNTER — Other Ambulatory Visit: Payer: Self-pay | Admitting: Internal Medicine

## 2019-04-11 ENCOUNTER — Ambulatory Visit (INDEPENDENT_AMBULATORY_CARE_PROVIDER_SITE_OTHER): Payer: Medicare Other | Admitting: Internal Medicine

## 2019-04-11 ENCOUNTER — Encounter: Payer: Self-pay | Admitting: Internal Medicine

## 2019-04-11 ENCOUNTER — Other Ambulatory Visit: Payer: Self-pay

## 2019-04-11 VITALS — BP 134/70 | HR 77 | Ht 67.0 in | Wt 172.0 lb

## 2019-04-11 DIAGNOSIS — E782 Mixed hyperlipidemia: Secondary | ICD-10-CM | POA: Diagnosis not present

## 2019-04-11 DIAGNOSIS — R9431 Abnormal electrocardiogram [ECG] [EKG]: Secondary | ICD-10-CM

## 2019-04-11 DIAGNOSIS — I350 Nonrheumatic aortic (valve) stenosis: Secondary | ICD-10-CM

## 2019-04-11 DIAGNOSIS — I10 Essential (primary) hypertension: Secondary | ICD-10-CM

## 2019-04-11 NOTE — Progress Notes (Signed)
OFFICE NOTE  Chief Complaint:  No complaints  Primary Care Physician: [name], [name], MD  HPI:  Daryl Webb is a 74 y.o. male followed by Dr. Mare Ferrari in the past for hypertension, dyslipidemia, type 2 diabetes and mild aortic sclerosis. His last echocardiogram was in 2015 which showed normal LV function and mild aortic sclerosis/borderline stenosis. Recent lab work was performed by myself indicating hemoglobin A1c of 6.7, creatinine 1.4 however he had a recent UTI, and cholesterol profile which was favorable with LDL 70 and total cholesterol 123. He is on atorvastatin 40 mg daily. He has no history of known coronary artery disease.  02/12/2016  [name] returns today for follow-up. He recently established a new primary care provider with Dr. Betty [name]. She performs some lab work including cholesterol which appears to be well-controlled with total cholesterol 113, LDL 58, triglycerides 130 and HDL of 29, which is increased from 24 in May 2017. He denies any new symptoms of chest pain or worsening shortness of breath. He does have a early peaking systolic murmur suggestive of mild aortic stenosis. This was last seen on echo in 2015. He is also describing pain in the left flank which radiates into the left groin. He was treated for possible UTI/prostatitis and had improvement in some urinary symptoms but has persistent pain. A CT scan was performed recently, which was essentially unremarkable. He reports his pain is worse when rising from either sitting or lying position up to standing and with walking. This could suggest some compression perhaps of the thoracic or lumbar spine or even symptoms suggestive of a possible hernia.   08/18/2016  [name] is seen today in follow-up. Overall he seems to be doing pretty well. He had an echocardiogram in January 2018 which now shows moderate aortic stenosis. This is worsened since his prior study in 2015. He has type 2 diabetes and  A1c is 6.7. Blood pressure is well-controlled today 122/60. Recent lipid profile showed an LDL-C of 58, which is at goal. He's asymptomatic, denies any chest pain or worsening shortness of breath. He does do some walking although says he can exercise more frequently.  02/23/2017  [name] was seen today in follow-up.  He had a recent echocardiogram which showed mild worsening of his aortic stenosis.  His mean gradient went from 22-24 mmHg.  This is still moderate left ear.  He has generally good blood pressure control.  Initially blood pressure is elevated today 142/86 however came down to 124/78.  His diabetes has been fairly well controlled.  He is followed by Dr. Buddy Duty for this.  Recent lipid profile showed an LDL-C of 58 on high intensity atorvastatin.  10/10/2018  [name] is seen today in follow-up.  Overall he is doing well.  He had a repeat echo in January of this year which showed slight worsening of his aortic stenosis.  His mean aortic gradient is increased from 24 to 30 mmHg putting it in the moderate to severe stenosis area.  He denies any chest pain or worsening shortness of breath.  He has had no syncopal events.  Diabetes is well controlled and he is followed by Dr. Buddy Duty.  His blood pressure is at goal today.  His lipids have been well managed.  His EKG shows sinus rhythm with some PACs today at 87.  04/28/2019  [name] returns for follow-up.  He recently had a repeat echo which I personally reviewed.  This shows a stable aortic  valve gradient around 39mmHg more consistent with moderate aortic stenosis.  A new finding, however was some inferior hypokinesis with a low normal EF 50 to 55%.  He denies any anginal symptoms.  This is reduced from previous EF of 65%.  EKG today shows sinus rhythm with some PACs however there are inferior and lateral T wave changes possibly suggestive of ischemia.  He says he has been less active over the past year but really denies any anginal  symptoms.  PMHx:  Past Medical History:  Diagnosis Date  . Anemia   . Arthritis   . Constipation due to pain medication    needs stool softner while on pain medication  . Diabetes mellitus   . Duodenal ulcer 08/13/2015   2014  - Dr Kaplan/GI  . H/O: osteoarthritis   . History of prostate cancer   . History of urethral stricture   . Hypercholesterolemia   . Hypertension   . Ulcer duodenal hemorrhage     Past Surgical History:  Procedure Laterality Date  . ARTHROPLASTY  2004   right knee Dr. Wynelle Link  . CARPAL TUNNEL RELEASE     right hand,wrist [name]  . CYSTOSCOPY N/A 09/20/2012   Procedure: CYSTOSCOPY;  Surgeon: [name], MD;  Location: [facility];  Service: Urology;  Laterality: N/A;  . JOINT REPLACEMENT Right   . KNEE ARTHROSCOPY Right   . LUMBAR LAMINECTOMY  02/08/2013  . PROSTATECTOMY     radical  . VASECTOMY  1985    FAMHx:  Family History  Problem Relation Age of Onset  . Heart disease Mother   . Heart failure Mother   . Heart disease Father   . Diabetes Father     SOCHx:   reports that he quit smoking about 39 years ago. His smoking use included cigarettes. He has a 30.00 pack-year smoking history. He has never used smokeless tobacco. He reports that he does not drink alcohol or use drugs.  ALLERGIES:  No Known Allergies  ROS: Pertinent items noted in HPI and remainder of comprehensive ROS otherwise negative.  HOME MEDS: Current Outpatient Medications  Medication Sig Dispense Refill  . amLODipine (NORVASC) 5 MG tablet Take 1 tablet (5 mg total) by mouth daily. 90 tablet 1  . amoxicillin (AMOXIL) 500 MG capsule TAKE 4 CAPSULES 1 HOUR PRIOR TO APPT  2  . aspirin 81 MG tablet Take 81 mg by mouth daily.    . Aspirin-Calcium Carbonate 81-777 MG TABS Take by mouth.    Marland Kitchen atorvastatin (LIPITOR) 40 MG tablet TAKE 1 TABLET DAILY 90 tablet 1  . Cholecalciferol 2000 UNITS CAPS Take 1 capsule by mouth daily. VITAMIN D 3    . Coenzyme Q10 (COQ10) 100 MG  CAPS Take 100 mg by mouth daily.    . cyanocobalamin (,VITAMIN B-12,) 1000 MCG/ML injection Inject into the muscle every 30 (thirty) days.     . diclofenac (VOLTAREN) 75 MG EC tablet Take 1 tablet (75 mg total) by mouth daily as needed. 90 tablet 2  . Exenatide ER (BYDUREON) 2 MG PEN Inject 1 Dose into the skin once a week.    . loratadine (CLARITIN) 10 MG tablet Take 5 mg by mouth daily. As needed for allergies    . losartan-hydrochlorothiazide (HYZAAR) 50-12.5 MG tablet TAKE 1 TABLET DAILY 90 tablet 3  . metFORMIN (GLUCOPHAGE) 1000 MG tablet Take 1 tablet (1,000 mg total) by mouth 2 (two) times daily with a meal. 180 tablet 3  . metoprolol succinate (  TOPROL-XL) 50 MG 24 hr tablet TAKE 1 TABLET DAILY 90 tablet 1  . Omega-3 Fatty Acids (FISH OIL) 1000 MG CAPS Take 1,000 mg by mouth daily.     No current facility-administered medications for this visit.    LABS/IMAGING: No results found for this or any previous visit (from the past 48 hour(s)). No results found.  WEIGHTS: Wt Readings from Last 3 Encounters:  04/11/19 172 lb (78 kg)  03/13/19 177 lb 6.4 oz (80.5 kg)  12/18/18 171 lb (77.6 kg)    VITALS: BP 134/70   Pulse 77   Ht 5\' 7"  (1.702 m)   Wt 172 lb (78 kg)   SpO2 98%   BMI 26.94 kg/m   EXAM: General appearance: alert and no distress Neck: no carotid bruit, no JVD and thyroid not enlarged, symmetric, no tenderness/mass/nodules Lungs: clear to auscultation bilaterally Heart: regular rate and rhythm, S1, S2 normal and systolic murmur: Midsystolic 3/6, crescendo at 2nd right intercostal space Abdomen: soft, non-tender; bowel sounds normal; no masses,  no organomegaly Extremities: extremities normal, atraumatic, no cyanosis or edema Pulses: 2+ and symmetric Skin: Skin color, texture, turgor normal. No rashes or lesions Neurologic: Grossly normal Psych: Pleasant  EKG: Sinus rhythm with PACs at 77, inferior and lateral T wave changes, possible ischemia-personally  reviewed  ASSESSMENT: 1. Low normal EF 50 to 55%, inferior hypokinesis, ischemic EKG changes 2. Hypertension-at goal 3. Dyslipidemia-at goal LDL<70 (52) 4. Diabetes type 2-A1c 7.0, followed by Dr. Buddy Duty 5. Moderate aortic stenosis - mean gradient 30 mmHg (02/2018)  PLAN: 1.   Mr. Petrella does have some new EKG changes as well as a low normal EF and some regional wall motion abnormalities on his echo.  His aortic stenosis seems to be stable.  He denies any anginal symptoms but has not been active.  I like to get a Lexiscan Myoview stress test to look for any significant reversible ischemia.  If there is a large defect he may need a cath.  Otherwise we will plan to continue to follow medically and repeat an echo in about a year.  [name], MD, [facility] [place] Director of the Advanced Lipid Disorders &  Cardiovascular Risk Reduction Clinic Diplomate of the American Board of Clinical Lipidology Attending Cardiologist  Direct Dial: [phone]  Fax: [phone]  Website:  [email or web address] 04/11/2019, 10:53 AM

## 2019-04-11 NOTE — Patient Instructions (Signed)
Medication Instructions:  Continue current medications  *If you need a refill on your cardiac medications before your next appointment, please call your pharmacy*   Lab Work: None Ordered   Testing/Procedures: Your physician has requested that you have a lexiscan myoview. For further information please visit [email or web address]. Please follow instruction sheet, as given.  Follow-Up: At [facility], you and your health needs are our priority.  As part of our continuing mission to provide you with exceptional heart care, we have created designated Provider Care Teams.  These Care Teams include your primary Cardiologist (physician) and Advanced Practice Providers (APPs -  Physician Assistants and Nurse Practitioners) who all work together to provide you with the care you need, when you need it.  We recommend signing up for the patient portal called "MyChart".  Sign up information is provided on this After Visit Summary.  MyChart is used to connect with patients for Virtual Visits (Telemedicine).  Patients are able to view lab/test results, encounter notes, upcoming appointments, etc.  Non-urgent messages can be sent to your provider as well.   To learn more about what you can do with MyChart, go to [email or web address].    Your next appointment:   1 year(s)  The format for your next appointment:   In Person  Provider:   You may see [name], MD or one of the following Advanced Practice Providers on your designated Care Team:    Almyra Deforest, PA-C  Fabian Sharp, PA-C or   Roby Lofts, [place]

## 2019-04-13 ENCOUNTER — Telehealth (HOSPITAL_COMMUNITY): Payer: Self-pay

## 2019-04-13 DIAGNOSIS — E538 Deficiency of other specified B group vitamins: Secondary | ICD-10-CM | POA: Diagnosis not present

## 2019-04-13 NOTE — Telephone Encounter (Signed)
Encounter complete. 

## 2019-04-18 ENCOUNTER — Ambulatory Visit (HOSPITAL_COMMUNITY)
Admission: RE | Admit: 2019-04-18 | Discharge: 2019-04-18 | Disposition: A | Discharge status: Home / Self Care | Payer: Medicare Other | Source: Ambulatory Visit | Attending: Cardiovascular Disease | Admitting: Cardiovascular Disease

## 2019-04-18 ENCOUNTER — Other Ambulatory Visit: Payer: Self-pay

## 2019-04-18 DIAGNOSIS — R9431 Abnormal electrocardiogram [ECG] [EKG]: Secondary | ICD-10-CM | POA: Diagnosis not present

## 2019-04-18 DIAGNOSIS — I350 Nonrheumatic aortic (valve) stenosis: Secondary | ICD-10-CM | POA: Insufficient documentation

## 2019-04-18 LAB — MYOCARDIAL PERFUSION IMAGING
LV dias vol: 149 mL (ref 62–150)
LV sys vol: 89 mL
Peak HR: 85 {beats}/min
Rest HR: 69 {beats}/min
SDS: 1
SRS: 0
SSS: 1
TID: 1.19

## 2019-04-18 MED ORDER — TECHNETIUM TC 99M TETROFOSMIN IV KIT
9.3000 | PACK | Freq: Once | INTRAVENOUS | Status: AC | PRN
  Administered 2019-04-18: 9.3 via INTRAVENOUS
  Filled 2019-04-18: qty 10

## 2019-04-18 MED ORDER — TECHNETIUM TC 99M TETROFOSMIN IV KIT
32.0000 | PACK | Freq: Once | INTRAVENOUS | Status: AC | PRN
  Administered 2019-04-18: 32 via INTRAVENOUS
  Filled 2019-04-18: qty 32

## 2019-04-18 MED ORDER — REGADENOSON 0.4 MG/5ML IV SOLN
0.4000 mg | Freq: Once | INTRAVENOUS | Status: AC
  Administered 2019-04-18: 0.4 mg via INTRAVENOUS

## 2019-05-15 DIAGNOSIS — E538 Deficiency of other specified B group vitamins: Secondary | ICD-10-CM | POA: Diagnosis not present

## 2019-05-17 ENCOUNTER — Other Ambulatory Visit: Payer: Self-pay

## 2019-05-18 ENCOUNTER — Encounter: Payer: Self-pay | Admitting: Family Medicine

## 2019-05-18 ENCOUNTER — Ambulatory Visit (INDEPENDENT_AMBULATORY_CARE_PROVIDER_SITE_OTHER): Payer: Medicare Other | Admitting: Family Medicine

## 2019-05-18 VITALS — BP 110/70 | HR 85 | Temp 97.7°F | Ht 67.0 in | Wt 175.5 lb

## 2019-05-18 DIAGNOSIS — M159 Polyosteoarthritis, unspecified: Secondary | ICD-10-CM | POA: Diagnosis not present

## 2019-05-18 DIAGNOSIS — N1832 Chronic kidney disease, stage 3b: Secondary | ICD-10-CM | POA: Diagnosis not present

## 2019-05-18 DIAGNOSIS — I10 Essential (primary) hypertension: Secondary | ICD-10-CM | POA: Diagnosis not present

## 2019-05-18 DIAGNOSIS — E119 Type 2 diabetes mellitus without complications: Secondary | ICD-10-CM

## 2019-05-18 LAB — COMPREHENSIVE METABOLIC PANEL
ALT: 26 U/L (ref 0–53)
AST: 20 U/L (ref 0–37)
Albumin: 4.3 g/dL (ref 3.5–5.2)
Alkaline Phosphatase: 66 U/L (ref 39–117)
BUN: 28 mg/dL — ABNORMAL HIGH (ref 6–23)
CO2: 26 mEq/L (ref 19–32)
Calcium: 9.7 mg/dL (ref 8.4–10.5)
Chloride: 101 mEq/L (ref 96–112)
Creatinine, Ser: 1.63 mg/dL — ABNORMAL HIGH (ref 0.40–1.50)
GFR: 41.54 mL/min — ABNORMAL LOW (ref >60.00)
Glucose, Bld: 150 mg/dL — ABNORMAL HIGH (ref 70–99)
Potassium: 4.4 mEq/L (ref 3.5–5.1)
Sodium: 137 mEq/L (ref 135–145)
Total Bilirubin: 0.5 mg/dL (ref 0.2–1.2)
Total Protein: 6.5 g/dL (ref 6.0–8.3)

## 2019-05-18 NOTE — Progress Notes (Signed)
HPI:  Daryl Webb is a 74 y.o. male, who is here today for 6 months follow up.   He was last seen on 12/18/18. No new problems sine his last visit.  Since his last OV, he has seen his cardiologist and had myocardial perfusion test done.   Hypertension: Currently on Amlodipine 5 mg daily and Losartan_HCTZ 50-12.5 mg daily. He is taking medications as instructed, no side effects reported.  He has not noted unusual headache, visual changes, exertional chest pain, dyspnea,  focal weakness, or edema.  Lab Results  Component Value Date   CREATININE 1.36 12/18/2018   BUN 19 12/18/2018   NA 137 12/18/2018   K 4.3 12/18/2018   CL 105 12/18/2018   CO2 24 12/18/2018   Echo on 05/10/19: LVEF 76-72%,CNO,BSJ grade I diastolic dysfunction. Lexiscan stress test 04/18/19:Low risk.  CKD III: He has not noted gross hematuria,decreased urine output,or foam in urine. Cr 1.4 and e GFR mid 50's, Cr has been as high as 1.6 and eGFR 43.  Generalized OA: He is on Diclofenac 75 mg daily. It is helping with pain. He has not noted side effects. He tried Tramadol but did not help as much as Diclofenac does. Pain is exacerbated by prolonged standing and/or walking. Able to do some yard work.  HLD: Last FLP 05/2018. He is on Atorvastatin 40 mg daily.  DM II, following with Dr Buddy Duty. Last HgA1C 6.7 in 11/2018. Negative for polydipsia,polyuria, or polyphagia.  Review of Systems  Constitutional: Negative for activity change, appetite change, fatigue and fever.  HENT: Negative for mouth sores, nosebleeds and sore throat.   Eyes: Negative for pain and redness.  Respiratory: Negative for cough and wheezing.   Cardiovascular: Negative for palpitations.  Gastrointestinal: Negative for abdominal pain, nausea and vomiting.  Genitourinary: Negative for decreased urine volume, dysuria and hematuria.  Musculoskeletal: Positive for arthralgias. Negative for joint swelling.  Skin: Negative for  rash and wound.  Neurological: Negative for syncope and facial asymmetry.  Rest of ROS, see pertinent positives sand negatives in HPI  Current Outpatient Medications on File Prior to Visit  Medication Sig Dispense Refill  . amLODipine (NORVASC) 5 MG tablet Take 1 tablet (5 mg total) by mouth daily. 90 tablet 1  . amoxicillin (AMOXIL) 500 MG capsule TAKE 4 CAPSULES 1 HOUR PRIOR TO APPT  2  . aspirin 81 MG tablet Take 81 mg by mouth daily.    . Aspirin-Calcium Carbonate 81-777 MG TABS Take by mouth.    Marland Kitchen atorvastatin (LIPITOR) 40 MG tablet TAKE 1 TABLET DAILY 90 tablet 1  . Cholecalciferol 2000 UNITS CAPS Take 1 capsule by mouth daily. VITAMIN D 3    . Coenzyme Q10 (COQ10) 100 MG CAPS Take 100 mg by mouth daily.    . cyanocobalamin (,VITAMIN B-12,) 1000 MCG/ML injection Inject into the muscle every 30 (thirty) days.     . diclofenac (VOLTAREN) 75 MG EC tablet Take 1 tablet (75 mg total) by mouth daily as needed. 90 tablet 2  . Exenatide ER (BYDUREON) 2 MG PEN Inject 1 Dose into the skin once a week.    . loratadine (CLARITIN) 10 MG tablet Take 5 mg by mouth daily. As needed for allergies    . losartan-hydrochlorothiazide (HYZAAR) 50-12.5 MG tablet TAKE 1 TABLET DAILY 90 tablet 3  . metFORMIN (GLUCOPHAGE) 500 MG tablet Take 500 mg by mouth 2 (two) times daily.    . metoprolol succinate (TOPROL-XL) 50 MG 24 hr  tablet TAKE 1 TABLET DAILY 90 tablet 1  . Omega-3 Fatty Acids (FISH OIL) 1000 MG CAPS Take 1,000 mg by mouth daily.     No current facility-administered medications on file prior to visit.     Past Medical History:  Diagnosis Date  . Anemia   . Arthritis   . Constipation due to pain medication    needs stool softner while on pain medication  . Diabetes mellitus   . Duodenal ulcer 08/13/2015   2014  - Dr Kaplan/GI  . H/O: osteoarthritis   . History of prostate cancer   . History of urethral stricture   . Hypercholesterolemia   . Hypertension   . Ulcer duodenal hemorrhage     No Known Allergies  Social History   Socioeconomic History  . Marital status: Married    Spouse name: Not on file  . Number of children: 2  . Years of education: Not on file  . Highest education level: Bachelor's degree (e.g., BA, AB, BS)  Occupational History  . Occupation: Retired  Tobacco Use  . Smoking status: Former Smoker    Packs/day: 1.50    Years: 20.00    Pack years: 30.00    Types: Cigarettes    Quit date: 02/09/1980    Years since quitting: 39.3  . Smokeless tobacco: Never Used  Substance and Sexual Activity  . Alcohol use: No  . Drug use: No  . Sexual activity: Not Currently  Other Topics Concern  . 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    Lives with wife,    two children in [place].    Enjoys reading, boating, travelling to [place] in the winter with wife.   Social Determinants of Health   Financial Resource Strain: Low Risk   . Difficulty of Paying Living Expenses: Not hard at all  Food Insecurity: No Food Insecurity  . Worried About Charity fundraiser in the Last Year: Never true  . Ran Out of Food in the Last Year: Never true  Transportation Needs: No Transportation Needs  . Lack of Transportation (Medical): No  . Lack of Transportation (Non-Medical): No  Physical Activity:   . Days of Exercise per Week:   . Minutes of Exercise per Session:   Stress: No Stress Concern Present  . Feeling of Stress : Only a little  Social Connections: Unknown  . Frequency of Communication with Friends and Family: Not on file  . Frequency of Social Gatherings with Friends and Family: Not on file  . Attends Religious Services: Not on file  . Active Member of Clubs or Organizations: Not on file  . Attends Archivist Meetings: Not on file  . Marital Status: Married    Vitals:   05/18/19 0950  BP: 110/70  Pulse: 85  Temp: 97.7 F (36.5 C)  SpO2: 95%   Body mass index is 27.49 kg/m.  Physical Exam   Nursing note and vitals reviewed. Constitutional: He is oriented to person, place, and time. He appears well-developed. No distress.  HENT:  Head: Normocephalic and atraumatic.  Mouth/Throat: Oropharynx is clear and moist and mucous membranes are normal.  Eyes: Pupils are equal, round, and reactive to light. Conjunctivae are normal.  Cardiovascular: Normal rate and regular rhythm.  Murmur (SEM II-III/VI RUSB.) heard. Pulses:      Dorsalis pedis pulses are 2+ on the right side and 2+ on the left side.  Respiratory: Effort normal and breath sounds  normal. No respiratory distress.  GI: Soft. He exhibits no mass. There is no hepatomegaly. There is no abdominal tenderness.  Musculoskeletal:        General: Edema (1+ pitting LE edema,bilateral.) present.  Lymphadenopathy:    He has no cervical adenopathy.  Neurological: He is alert and oriented to person, place, and time. He has normal strength. No cranial nerve deficit.  Antalgic gait, not assisted.  Skin: Skin is warm. No rash noted. No erythema.  Psychiatric: He has a normal mood and affect.  Well groomed, good eye contact.    ASSESSMENT AND PLAN:   Daryl Webb was seen today for 6 months follow-up.  Orders Placed This Encounter  Procedures  . Comprehensive metabolic panel   Lab Results  Component Value Date   CREATININE 1.63 (H) 05/18/2019   BUN 28 (H) 05/18/2019   NA 137 05/18/2019   K 4.4 05/18/2019   CL 101 05/18/2019   CO2 26 05/18/2019   Lab Results  Component Value Date   ALT 26 05/18/2019   AST 20 05/18/2019   ALKPHOS 66 05/18/2019   BILITOT 0.5 05/18/2019    1. Generalized osteoarthritis of multiple sites Problem is better controlled with daily diclofenac. Side effects discussed, including CV complications. Will continue following renal function and BP.  2. Essential hypertension BP adequately controlled. No changes in current ,anagement. Continue low salt diet and check BP  regularly.  3. Stage 3b chronic kidney disease Problem has been stable. He understands effects of NSAID's on renal function. Continue low salt diet,adequate glucose and BP controlled, and Losartan.  4. Type 2 diabetes mellitus without complication, without long-term current use of insulin (Cerulean) Following with Dr Buddy Duty.   Return in about 6 months (around 11/17/2019).   Takima Encina G. [name], MD  [facility]. [place] office.   A few things to remember from today's visit:   No changes in Diclofenac or other meds. Please have your cholesterol check during your next appt with endocrinologist.  Tai Chi also may help with joint pain.   Please be sure medication list is accurate. If a new problem present, please set up appointment sooner than planned today.

## 2019-05-18 NOTE — Patient Instructions (Signed)
A few things to remember from today's visit:   No changes in Diclofenac or other meds. Please have your cholesterol check during your next appt with endocrinologist.  Tai Chi also may help with joint pain.   Please be sure medication list is accurate. If a new problem present, please set up appointment sooner than planned today.

## 2019-05-20 ENCOUNTER — Encounter: Payer: Self-pay | Admitting: Family Medicine

## 2019-06-05 DIAGNOSIS — E119 Type 2 diabetes mellitus without complications: Secondary | ICD-10-CM | POA: Diagnosis not present

## 2019-06-05 DIAGNOSIS — E538 Deficiency of other specified B group vitamins: Secondary | ICD-10-CM | POA: Diagnosis not present

## 2019-06-05 DIAGNOSIS — R300 Dysuria: Secondary | ICD-10-CM | POA: Diagnosis not present

## 2019-06-05 DIAGNOSIS — Z5181 Encounter for therapeutic drug level monitoring: Secondary | ICD-10-CM | POA: Diagnosis not present

## 2019-06-05 DIAGNOSIS — R3989 Other symptoms and signs involving the genitourinary system: Secondary | ICD-10-CM | POA: Diagnosis not present

## 2019-06-05 DIAGNOSIS — N1831 Chronic kidney disease, stage 3a: Secondary | ICD-10-CM | POA: Diagnosis not present

## 2019-06-05 LAB — COMPREHENSIVE METABOLIC PANEL: Calcium: 10 (ref 8.7–10.7)

## 2019-06-05 LAB — HEPATIC FUNCTION PANEL
ALT: 18 (ref 10–40)
AST: 14 (ref 14–40)
Alkaline Phosphatase: 67 (ref 25–125)
Bilirubin, Total: 0.5

## 2019-06-05 LAB — BASIC METABOLIC PANEL
BUN: 27 — AB (ref 4–21)
CO2: 25 — AB (ref 13–22)
Chloride: 102 (ref 99–108)
Creatinine: 1.5 — AB (ref 0.6–1.3)
Glucose: 149
Potassium: 4.2 (ref 3.4–5.3)
Sodium: 138 (ref 137–147)

## 2019-06-05 LAB — HEMOGLOBIN A1C: Hemoglobin A1C: 6.9

## 2019-06-15 DIAGNOSIS — E538 Deficiency of other specified B group vitamins: Secondary | ICD-10-CM | POA: Diagnosis not present

## 2019-06-21 ENCOUNTER — Telehealth: Payer: Self-pay | Admitting: Family Medicine

## 2019-06-21 NOTE — Chronic Care Management (AMB) (Signed)
  Chronic Care Management   Note  06/21/2019 Name: KEISHUN RAPER MRN: [id number] DOB: 1945-06-22  Talmage Coin Peckinpaugh is a 74 y.o. year old male who is a primary care patient of [name], [name], MD. I reached out to Alinda Money by phone today in response to a referral sent by Mr. Bretten Schneeberger Morency's PCP, [name], [name], MD.   Mr. Kerkstra was given information about Chronic Care Management services today including:  1. CCM service includes personalized support from designated clinical staff supervised by his physician, including individualized plan of care and coordination with other care providers 2. 24/7 contact phone numbers for assistance for urgent and routine care needs. 3. Service will only be billed when office clinical staff spend 20 minutes or more in a month to coordinate care. 4. Only one practitioner may furnish and bill the service in a calendar month. 5. The patient may stop CCM services at any time (effective at the end of the month) by phone call to the office staff.   Patient agreed to services and verbal consent obtained.   Follow up plan:   [place]

## 2019-07-02 ENCOUNTER — Telehealth: Payer: Self-pay | Admitting: Family Medicine

## 2019-07-02 NOTE — Telephone Encounter (Signed)
Patient called and stated he has recurrent uti's.  His endocrinologist Dr Buddy Duty gave him an antibiotic about 1 month ago he said and Dr Buddy Duty told the pt if it returned he would need to talk to Dr. [name].  Patient is requesting a call because the uti symptoms have returned.

## 2019-07-02 NOTE — Telephone Encounter (Signed)
Pt has been scheduled.   Pt wants to know if Dr.Jordan has received UTI information from a visit with Dr.Kerr. Pt want to make sure that information is received.

## 2019-07-02 NOTE — Telephone Encounter (Signed)
Patient has been scheduled with Dr. Volanda Napoleon on 07/04/2019.

## 2019-07-02 NOTE — Telephone Encounter (Signed)
I believe he follows with urologist. For recurrent UTI's I recommend arranging appt with urologist. If having symptoms, please arrange appt, can be virtual for today and he needs to bring urine sample. Thanks, BJ

## 2019-07-04 ENCOUNTER — Ambulatory Visit (INDEPENDENT_AMBULATORY_CARE_PROVIDER_SITE_OTHER): Payer: Medicare Other | Admitting: Family Medicine

## 2019-07-04 ENCOUNTER — Encounter: Payer: Self-pay | Admitting: Family Medicine

## 2019-07-04 ENCOUNTER — Other Ambulatory Visit: Payer: Self-pay

## 2019-07-04 VITALS — BP 130/74 | HR 84 | Temp 96.9°F | Wt 173.0 lb

## 2019-07-04 DIAGNOSIS — R350 Frequency of micturition: Secondary | ICD-10-CM

## 2019-07-04 DIAGNOSIS — K5900 Constipation, unspecified: Secondary | ICD-10-CM | POA: Diagnosis not present

## 2019-07-04 DIAGNOSIS — R300 Dysuria: Secondary | ICD-10-CM

## 2019-07-04 LAB — POC URINALSYSI DIPSTICK (AUTOMATED)
Bilirubin, UA: NEGATIVE
Blood, UA: NEGATIVE
Glucose, UA: NEGATIVE
Ketones, UA: NEGATIVE
Leukocytes, UA: NEGATIVE
Nitrite, UA: NEGATIVE
Protein, UA: POSITIVE — AB
Spec Grav, UA: 1.02 (ref 1.010–1.025)
Urobilinogen, UA: 0.2 E.U./dL
pH, UA: 5.5 (ref 5.0–8.0)

## 2019-07-04 NOTE — Patient Instructions (Signed)

## 2019-07-04 NOTE — Progress Notes (Signed)
Subjective:    Patient ID: Daryl Webb, male    DOB: 1945-02-10, 74 y.o.   MRN: [id number]  No chief complaint on file.   HPI Patient was seen today for acute concern, followed by Dr. [name].  Pt notes urinary urgency, frequency, and dysuria x1 month.  Pt states he was initially seen by his Endocrinologist who started an abx.  Pt was symptom free x 1 wk, before symptoms restarted.  Pt endorses having leakage as could not get to the restroom in time.  Taking Azo and increasing po intake.Pt has a copy of labs including UA from late April.  Urine culture obtained however results not included in paperwork.  Pt with h/o prostate cancer s/p prostatectomy.  Denies recent Urology f/u. Endorses some constipation.  Last BM this am.    Pt upset/ frustrated that his PCP and Dr. Buddy Duty, endocrinologist are unable to see his info on the same computer system.  Pt inquires if Dr. [name] received any info from Dr. Buddy Duty.  Pt advised to f/u with his PCP as this provider did not have the information.    Past Medical History:  Diagnosis Date  . Anemia   . Arthritis   . Constipation due to pain medication    needs stool softner while on pain medication  . Diabetes mellitus   . Duodenal ulcer 08/13/2015   2014  - Dr Kaplan/GI  . H/O: osteoarthritis   . History of prostate cancer   . History of urethral stricture   . Hypercholesterolemia   . Hypertension   . Ulcer duodenal hemorrhage     No Known Allergies  ROS General: Denies fever, chills, night sweats, changes in weight, changes in appetite HEENT: Denies headaches, ear pain, changes in vision, rhinorrhea, sore throat CV: Denies CP, palpitations, SOB, orthopnea Pulm: Denies SOB, cough, wheezing GI: Denies abdominal pain, nausea, vomiting, diarrhea, constipation GU: Denies hematuria,   + dysuria, frequency Msk: Denies muscle cramps, joint pains Neuro: Denies weakness, numbness, tingling Skin: Denies rashes, bruising Psych: Denies depression,  anxiety, hallucinations      Objective:    Blood pressure 130/74, pulse 84, temperature (!) 96.9 F (36.1 C), temperature source Temporal, weight 173 lb (78.5 kg), SpO2 97 %.   Gen. Pleasant, well-nourished, in no distress, normal affect   HEENT: [place]/AT, face symmetric, no scleral icterus, PERRLA, EOMI, nares patent without drainage Lungs: no accessory muscle use Cardiovascular: RRR, no peripheral edema Neuro:  A&Ox3, CN II-XII intact, normal gait Skin:  Warm, no lesions/ rash  Wt Readings from Last 3 Encounters:  07/04/19 173 lb (78.5 kg)  05/18/19 175 lb 8 oz (79.6 kg)  04/18/19 172 lb (78 kg)    Lab Results  Component Value Date   WBC 11.0 (H) 08/28/2014   HGB 14.4 08/28/2014   HCT 43.0 08/28/2014   PLT 249.0 08/28/2014   GLUCOSE 150 (H) 05/18/2019   CHOL 94 06/28/2017   TRIG 112.0 06/28/2017   HDL 27.90 (L) 06/28/2017   LDLDIRECT 73.8 11/20/2012   LDLCALC 43 06/28/2017   ALT 26 05/18/2019   AST 20 05/18/2019   NA 137 05/18/2019   K 4.4 05/18/2019   CL 101 05/18/2019   CREATININE 1.63 (H) 05/18/2019   BUN 28 (H) 05/18/2019   CO2 26 05/18/2019   HGBA1C 6.7 (H) 06/19/2015   MICROALBUR 84.2 01/06/2015    Assessment/Plan:  Urinary frequency -Possibly 2/2 above increase p.o. intake -UA with SG 1.020, 1+ protein  - Plan: POCT Urinalysis Dipstick (  Automated)  Dysuria  -Discussed possible causes.  Patient advised increase p.o. intake and Azo may have effective results as UA largely negative -pt advised constipation can contribute to symptoms.  Consider miralax daily. -Consider follow-up with urology for continued symptoms - Plan: Urine Culture  Constipation -supportive care  F/u as needed with PCP  Grier Mitts, MD

## 2019-07-06 ENCOUNTER — Telehealth: Payer: Self-pay | Admitting: Family Medicine

## 2019-07-06 ENCOUNTER — Other Ambulatory Visit: Payer: Self-pay | Admitting: Family Medicine

## 2019-07-06 DIAGNOSIS — N3000 Acute cystitis without hematuria: Secondary | ICD-10-CM

## 2019-07-06 LAB — URINE CULTURE
MICRO NUMBER:: 10522492
SPECIMEN QUALITY:: ADEQUATE

## 2019-07-06 MED ORDER — AMOXICILLIN-POT CLAVULANATE 875-125 MG PO TABS: 1.0000 | ORAL_TABLET | Freq: Two times a day (BID) | ORAL | 0 refills | Status: AC

## 2019-07-06 NOTE — Telephone Encounter (Signed)
Patient's wife called wanting the results for the UA culture and wants to know if there is going to be any antibiotics called in for the patient before the holiday weekend.  Please advise

## 2019-07-06 NOTE — Telephone Encounter (Signed)
Returned call to patient's wife and informed her that culture results are not back, it normally takes about 4 or 5 days and once they do, the CMA will call with recommendations from Dr. Volanda Napoleon. He say Dr. Volanda Napoleon on 07/04/2019. [name] verbalized understanding.

## 2019-07-10 ENCOUNTER — Telehealth: Payer: Self-pay | Admitting: Family Medicine

## 2019-07-10 NOTE — Telephone Encounter (Signed)
Recv'd records from [place] forwarded 4 pages to Dr. Betty [name] 6/1/21fbg

## 2019-07-17 DIAGNOSIS — R825 Elevated urine levels of drugs, medicaments and biological substances: Secondary | ICD-10-CM | POA: Diagnosis not present

## 2019-07-17 DIAGNOSIS — N35012 Post-traumatic membranous urethral stricture: Secondary | ICD-10-CM | POA: Diagnosis not present

## 2019-07-17 DIAGNOSIS — R3915 Urgency of urination: Secondary | ICD-10-CM | POA: Diagnosis not present

## 2019-07-17 DIAGNOSIS — C61 Malignant neoplasm of prostate: Secondary | ICD-10-CM | POA: Diagnosis not present

## 2019-07-18 DIAGNOSIS — E538 Deficiency of other specified B group vitamins: Secondary | ICD-10-CM | POA: Diagnosis not present

## 2019-07-25 ENCOUNTER — Other Ambulatory Visit: Payer: Self-pay | Admitting: *Deleted

## 2019-07-25 DIAGNOSIS — E119 Type 2 diabetes mellitus without complications: Secondary | ICD-10-CM

## 2019-07-25 DIAGNOSIS — I10 Essential (primary) hypertension: Secondary | ICD-10-CM

## 2019-07-25 NOTE — Chronic Care Management (AMB) (Signed)
Chronic Care Management Pharmacy  Name: Daryl Webb  MRN: [id number] DOB: 11-24-1945   Chief Complaint/ HPI  Daryl Webb,  74 y.o. , male presents for their Initial CCM visit with the clinical pharmacist via telephone due to COVID-19 Pandemic. Patient is with his wife during the visit to help him out wit his medications. He reports to be doing well and had stories about his experiences for the past months. He mentions getting better from the UTI he had 2 months ago and said the Uro-MP is helping him. He mentions taking that twice daily currently. He also had questions about diclofenac and the side effects it has. He mentions that this medication really helps him control his osteoarthritis and has been taking it for 25 years. He voiced out his concerns too regarding lack of communication between his lab results from Dr. Buddy Duty at [facility] and Dr. [name] at [facility].   PCP : [name], [name], MD  Their chronic conditions include: HTN, HLD, type 2 diabetes, osteoarthritis, CKD stage II  Office Visits: 05/18/19 OV - 6 month f/u. Patient is stable. No changes with her meds. DM2 followed by Endocrinology. Advised to have lipid panel on next appt with endo.  Consult Visit: 07/04/19 [facility] ([facility]) - Urinary frequency. No changes with meds. Discussed possible causes of dysuria. Consider follow-up with urology for continued symptoms. Klebsiella found on urine culture and started on augmentin 875/125 mg 1 tablet twice daily x 14 days.  Medications: Outpatient Encounter Medications as of 08/01/2019  Medication Sig  . amLODipine (NORVASC) 5 MG tablet Take 1 tablet (5 mg total) by mouth daily.  Marland Kitchen amoxicillin (AMOXIL) 500 MG capsule TAKE 4 CAPSULES 1 HOUR PRIOR TO APPT  . aspirin 81 MG tablet Take 81 mg by mouth daily.  Marland Kitchen atorvastatin (LIPITOR) 40 MG tablet TAKE 1 TABLET DAILY  . Cholecalciferol 2000 UNITS CAPS Take 1 capsule by mouth daily. VITAMIN D 3  . Coenzyme Q10 (COQ10) 100 MG  CAPS Take 100 mg by mouth daily.  . cyanocobalamin (,VITAMIN B-12,) 1000 MCG/ML injection Inject into the muscle every 30 (thirty) days.   . diclofenac (VOLTAREN) 75 MG EC tablet Take 1 tablet (75 mg total) by mouth daily as needed.  . Exenatide ER (BYDUREON) 2 MG PEN Inject 1 Dose into the skin once a week.  . loratadine (CLARITIN) 10 MG tablet Take 5 mg by mouth daily. As needed for allergies  . losartan-hydrochlorothiazide (HYZAAR) 50-12.5 MG tablet TAKE 1 TABLET DAILY  . metFORMIN (GLUCOPHAGE) 500 MG tablet Take 500 mg by mouth 2 (two) times daily.  . metoprolol succinate (TOPROL-XL) 50 MG 24 hr tablet TAKE 1 TABLET DAILY  . Omega-3 Fatty Acids (FISH OIL) 1000 MG CAPS Take 1,000 mg by mouth daily.  . Aspirin-Calcium Carbonate 81-777 MG TABS Take by mouth. (Patient not taking: Reported on 08/01/2019)  . [DISCONTINUED] losartan-hydrochlorothiazide (HYZAAR) 50-12.5 MG tablet TAKE 1 TABLET DAILY   No facility-administered encounter medications on file as of 08/01/2019.     Transportation Needs: No Transportation Needs  . Lack of Transportation (Medical): No  . Lack of Transportation (Non-Medical): No     Physical Activity: Inactive  . Days of Exercise per Week: 0 days  . Minutes of Exercise per Session: 0 min    Current Diagnosis/Assessment:  Goals Addressed            This Visit's Progress   . Chronic Care Management       CARE PLAN  ENTRY  Current Barriers:  . Chronic Disease Management support, education, and care coordination needs related to Hypertension, Hyperlipidemia, and Diabetes   Hypertension BP Readings from Last 3 Encounters:  07/04/19 130/74  05/18/19 110/70  04/11/19 134/70   . Pharmacist Clinical Goal(s): o Over the next 120 days, patient will work with PharmD and providers to maintain BP goal <130/80 . Current regimen:  . Amlodipine 5 mg 1 tablet daily . Losartan/HCTZ 50-12.5 mg 1 tablet daily . Metoprolol succinate 50 mg 1 tablet  daily . Interventions: o Discussed low salt diet and exercising as tolerated extensively . Patient self care activities - Over the next 120 days, patient will: o Check BP 2-3 times aw eek, document, and provide at future appointments o Ensure daily salt intake < 2300 mg/day  Hyperlipidemia Lab Results  Component Value Date/Time   LDLCALC 43 06/28/2017 09:15 AM   LDLDIRECT 73.8 11/20/2012 08:40 AM   . Pharmacist Clinical Goal(s): o Over the next 120 days, patient will work with PharmD and providers to maintain LDL goal < 100 . Current regimen:  o Atorvastatin 40 mg 1 tablet daily  . Interventions: o Discussed low cholesterol diet and exercising as tolerated extensively . Patient self care activities - Over the next 120 days, patient will: o Continue eating heart healthy diet  Diabetes Lab Results  Component Value Date/Time   HGBA1C 6.7 (H) 06/19/2015 09:06 AM   HGBA1C 7.1 (H) 01/06/2015 08:22 AM   . Pharmacist Clinical Goal(s): o Over the next 120 days, patient will work with PharmD and providers to maintain A1c goal <7% . Current regimen:  . Bydureon 2 mg pen inject 1 dose into the skin once a week . Metformin 500 mg 1 tablet twice daily . Interventions: o Discussed carbohydrate counting and exercising as tolerated extensively . Patient self care activities - Over the next 120 days, patient will: o Check blood sugar once daily, document, and provide at future appointments o Contact provider with any episodes of hypoglycemia  Medication management . Pharmacist Clinical Goal(s): o Over the next 120 days, patient will work with PharmD and providers to maintain optimal medication adherence . Current pharmacy: [facility] . Interventions o Comprehensive medication review performed. o Continue current medication management strategy . Patient self care activities - Over the next 120 days, patient will: o Focus on medication adherence by using his own system every  morning o Take medications as prescribed o Report any questions or concerns to PharmD and/or provider(s)  Initial goal documentation        Hypertension   Office blood pressures are  BP Readings from Last 3 Encounters:  07/04/19 130/74  05/18/19 110/70  04/11/19 134/70    Patient has failed these meds in the past: None Patient is currently controlled on the following medications:  . Amlodipine 5 mg 1 tablet daily HS . Losartan/HCTZ 50-12.5 mg 1 tablet daily AM . Metoprolol succinate 50 mg 1 tablet daily AM  Patient checks BP at home 2-3 times weekly  Patient home BP readings are ranging: 120/70  Patient within goal <130/80. We discussed diet and exercise extensively. Patient reports having wrist monitor to check BP. Patient states that his diet could be better. Denies having exercise routine.  Plan  Continue current medications    Hyperlipidemia   Lipid Panel     Component Value Date/Time   CHOL 94 06/28/2017 0915   TRIG 112.0 06/28/2017 0915   HDL 27.90 (L) 06/28/2017 0915   CHOLHDL 3  06/28/2017 0915   VLDL 22.4 06/28/2017 0915   LDLCALC 43 06/28/2017 0915   LDLDIRECT 73.8 11/20/2012 0840     The ASCVD Risk score (Goff DC Jr., et al., 2013) failed to calculate for the following reasons:   The valid total cholesterol range is 130 to 320 mg/dL   Patient has failed these meds in past: None Patient is currently controlled on the following medications:  . Atorvastatin 40 mg 1 tablet daily    Lipid panel outdated (2019). Needs new lipid panel to assess current state. Patient denies routine exercise. Denies myalgias associated with atorvastatin.  Plan  Continue current medications   Diabetes   Recent Relevant Labs: Lab Results  Component Value Date/Time   HGBA1C 6.7 (H) 06/19/2015 09:06 AM   HGBA1C 7.1 (H) 01/06/2015 08:22 AM   GFR 41.54 (L) 05/18/2019 10:41 AM   GFR 51.26 (L) 12/18/2018 10:48 AM   MICROALBUR 84.2 01/06/2015 08:22 AM     Followed  by Dr. Buddy Duty from [place]. Checking BG when eating something that is not healthy. Patient does not want to be pursued lab works under 2 clinics. He mentions having frustration when 2 clinics have gaps with lab results. Patient has been trying to forward his results from Dr. Buddy Duty forward to Dr. [name]. Current A1c on file is outdated. 06/08/19 - 6.9% per patient from his recent visit with Dr. Buddy Duty.  Patient has failed these meds in past: None Patient is currently controlled on the following medications:  . Bydureon 2 mg pen inject 1 dose into the skin once a week . Metformin 500 mg 1 tablet twice daily  Last diabetic Eye exam: No results found for: HMDIABEYEEXA  Last diabetic Foot exam: No results found for: HMDIABFOOTEX   Plan  Continue current medications   Osteoarthritis   Patient has failed these meds in past: None Patient is currently controlled on the following medications:  . Diclofenac EC 75 mg 1 tablet daily   Patient has been doing well with diclofenac for his arthritis. He mentions that when they tried to take him off this medications, his pain was unbearable. Patient understands the risk of being put on this. Advised the patient of proper hydration and having food before taking diclofenac.  Plan  Continue current medications   OTCs/Health Maintenance   Patient is currently controlled on the following medications: . Aspirin 81 mg 1 tablet daily . Vitamin D 2000 units 1 capsule daily . CoQ10 100 mg 1 capsule daily . Vitamin B12 1000 mcg/ml inject into the muscle every 30 days . Loratidine 5 mg 1 tablet daily as needed for allergies  . Fish oil 1000 mg 1 capsule daily . Vitamin C 1000 mg 1 tablet daily  Plan  Continue current medications  Vaccines   Reviewed and discussed patient's vaccination history.    Immunization History  Administered Date(s) Administered  . Influenza, High Dose Seasonal PF 12/03/2016, 11/03/2018  . Influenza-Unspecified 01/08/2017  .  Pneumococcal Polysaccharide-23 01/28/2017  . Zoster Recombinat (Shingrix) 09/14/2018, 11/17/2018    Plan  Patient current on vaccines.   Medication Management   Ph[facility]Benefits[facility] [employer] Adherence: Patient has his own system of taking medication. He has vials lined up on the shelf of their bathroom. Pt endorses 100% compliance  [facility] for [facility] day supply and uses [facility] [place] current medication management strategy   Follow up: 4 month phone visit

## 2019-07-25 NOTE — Telephone Encounter (Signed)
Referral placed as requested for CCM. 

## 2019-07-29 ENCOUNTER — Other Ambulatory Visit: Payer: Self-pay | Admitting: Internal Medicine

## 2019-08-01 ENCOUNTER — Ambulatory Visit: Payer: Medicare Other | Admitting: Pharmacist

## 2019-08-01 ENCOUNTER — Other Ambulatory Visit: Payer: Self-pay

## 2019-08-01 DIAGNOSIS — E782 Mixed hyperlipidemia: Secondary | ICD-10-CM

## 2019-08-01 DIAGNOSIS — E119 Type 2 diabetes mellitus without complications: Secondary | ICD-10-CM

## 2019-08-01 DIAGNOSIS — I10 Essential (primary) hypertension: Secondary | ICD-10-CM

## 2019-08-01 NOTE — Patient Instructions (Addendum)
Visit Information  It was a pleasure meeting with you today, Daryl Webb. I hear you and I will try to give the best care that I can and support you with anything you need at this clinic. Reach out to me anytime. I look forward to working with you to achieve your health care goals. Below is a summary of what we talked about during the visit:  Goals Addressed            This Visit's Progress   . Chronic Care Management       CARE PLAN ENTRY  Current Barriers:  . Chronic Disease Management support, education, and care coordination needs related to Hypertension, Hyperlipidemia, and Diabetes   Hypertension BP Readings from Last 3 Encounters:  07/04/19 130/74  05/18/19 110/70  04/11/19 134/70   . Pharmacist Clinical Goal(s): o Over the next 120 days, patient will work with PharmD and providers to maintain BP goal <130/80 . Current regimen:  . Amlodipine 5 mg 1 tablet daily . Losartan/HCTZ 50-12.5 mg 1 tablet daily . Metoprolol succinate 50 mg 1 tablet daily . Interventions: o Discussed low salt diet and exercising as tolerated extensively . Patient self care activities - Over the next 120 days, patient will: o Check BP 2-3 times aw eek, document, and provide at future appointments o Ensure daily salt intake < 2300 mg/day  Hyperlipidemia Lab Results  Component Value Date/Time   LDLCALC 43 06/28/2017 09:15 AM   LDLDIRECT 73.8 11/20/2012 08:40 AM   . Pharmacist Clinical Goal(s): o Over the next 120 days, patient will work with PharmD and providers to maintain LDL goal < 100 . Current regimen:  o Atorvastatin 40 mg 1 tablet daily  . Interventions: o Discussed low cholesterol diet and exercising as tolerated extensively . Patient self care activities - Over the next 120 days, patient will: o Continue eating heart healthy diet  Diabetes Lab Results  Component Value Date/Time   HGBA1C 6.7 (H) 06/19/2015 09:06 AM   HGBA1C 7.1 (H) 01/06/2015 08:22 AM   . Pharmacist Clinical  Goal(s): o Over the next 120 days, patient will work with PharmD and providers to maintain A1c goal <7% . Current regimen:  . Bydureon 2 mg pen inject 1 dose into the skin once a week . Metformin 500 mg 1 tablet twice daily . Interventions: o Discussed carbohydrate counting and exercising as tolerated extensively . Patient self care activities - Over the next 120 days, patient will: o Check blood sugar once daily, document, and provide at future appointments o Contact provider with any episodes of hypoglycemia  Medication management . Pharmacist Clinical Goal(s): o Over the next 120 days, patient will work with PharmD and providers to maintain optimal medication adherence . [facility] . Interventions o Comprehensive medication review performed. o Continue current medication management strategy . Patient self care activities - Over the next 120 days, patient will: o Focus on medication adherence by using his own system every morning o Take medications as prescribed o Report any questions or concerns to PharmD and/or provider(s)  Initial goal documentation        [name] was given information about Chronic Care Management services today including:  1. CCM service includes personalized support from designated clinical staff supervised by his physician, including individualized plan of care and coordination with other care providers 2. 24/7 contact phone numbers for assistance for urgent and routine care needs. 3. Standard insurance, coinsurance, copays and deductibles apply for chronic care management  only during months in which we provide at least 20 minutes of these services. Most insurances cover these services at 100%, however patients may be responsible for any copay, coinsurance and/or deductible if applicable. This service may help you avoid the need for more expensive face-to-face services. 4. Only one practitioner may furnish and bill the service in a  calendar month. 5. The patient may stop CCM services at any time (effective at the end of the month) by phone call to the office staff.  Patient agreed to services and verbal consent obtained.   The patient verbalized understanding of instructions provided today and agreed to receive a mailed copy of patient instruction and/or educational materials. Telephone follow up appointment with pharmacy team member scheduled for: 11/27/19 at [place], PharmD Clinical Pharmacist [facility] [phone]   Diabetes Mellitus and Exercise Exercising regularly is important for your overall health, especially when you have diabetes (diabetes mellitus). Exercising is not only about losing weight. It has many other health benefits, such as increasing muscle strength and bone density and reducing body fat and stress. This leads to improved fitness, flexibility, and endurance, all of which result in better overall health. Exercise has additional benefits for people with diabetes, including:  Reducing appetite.  Helping to lower and control blood glucose.  Lowering blood pressure.  Helping to control amounts of fatty substances (lipids) in the blood, such as cholesterol and triglycerides.  Helping the body to respond better to insulin (improving insulin sensitivity).  Reducing how much insulin the body needs.  Decreasing the risk for heart disease by: ? Lowering cholesterol and triglyceride levels. ? Increasing the levels of good cholesterol. ? Lowering blood glucose levels. What is my activity plan? Your health care provider or certified diabetes educator can help you make a plan for the type and frequency of exercise (activity plan) that works for you. Make sure that you:  Do at least 150 minutes of moderate-intensity or vigorous-intensity exercise each week. This could be brisk walking, biking, or water aerobics. ? Do stretching and strength exercises, such as  yoga or weightlifting, at least 2 times a week. ? Spread out your activity over at least 3 days of the week.  Get some form of physical activity every day. ? Do not go more than 2 days in a row without some kind of physical activity. ? Avoid being inactive for more than 30 minutes at a time. Take frequent breaks to walk or stretch.  Choose a type of exercise or activity that you enjoy, and set realistic goals.  Start slowly, and gradually increase the intensity of your exercise over time. What do I need to know about managing my diabetes?   Check your blood glucose before and after exercising. ? If your blood glucose is 240 mg/dL (13.3 mmol/L) or higher before you exercise, check your urine for ketones. If you have ketones in your urine, do not exercise until your blood glucose returns to normal. ? If your blood glucose is 100 mg/dL (5.6 mmol/L) or lower, eat a snack containing 15-20 grams of carbohydrate. Check your blood glucose 15 minutes after the snack to make sure that your level is above 100 mg/dL (5.6 mmol/L) before you start your exercise.  Know the symptoms of low blood glucose (hypoglycemia) and how to treat it. Your risk for hypoglycemia increases during and after exercise. Common symptoms of hypoglycemia can include: ? Hunger. ? Anxiety. ? Sweating and feeling clammy. ? Confusion. ?  Dizziness or feeling light-headed. ? Increased heart rate or palpitations. ? Blurry vision. ? Tingling or numbness around the mouth, lips, or tongue. ? Tremors or shakes. ? Irritability.  Keep a rapid-acting carbohydrate snack available before, during, and after exercise to help prevent or treat hypoglycemia.  Avoid injecting insulin into areas of the body that are going to be exercised. For example, avoid injecting insulin into: ? The arms, when playing tennis. ? The legs, when jogging.  Keep records of your exercise habits. Doing this can help you and your health care provider adjust your  diabetes management plan as needed. Write down: ? Food that you eat before and after you exercise. ? Blood glucose levels before and after you exercise. ? The type and amount of exercise you have done. ? When your insulin is expected to peak, if you use insulin. Avoid exercising at times when your insulin is peaking.  When you start a new exercise or activity, work with your health care provider to make sure the activity is safe for you, and to adjust your insulin, medicines, or food intake as needed.  Drink plenty of water while you exercise to prevent dehydration or heat stroke. Drink enough fluid to keep your urine clear or pale yellow. Summary  Exercising regularly is important for your overall health, especially when you have diabetes (diabetes mellitus).  Exercising has many health benefits, such as increasing muscle strength and bone density and reducing body fat and stress.  Your health care provider or certified diabetes educator can help you make a plan for the type and frequency of exercise (activity plan) that works for you.  When you start a new exercise or activity, work with your health care provider to make sure the activity is safe for you, and to adjust your insulin, medicines, or food intake as needed. This information is not intended to replace advice given to you by your health care provider. Make sure you discuss any questions you have with your health care provider. Document Revised: 08/19/2016 Document Reviewed: 07/07/2015 Elsevier Patient Education  [place].

## 2019-08-15 ENCOUNTER — Other Ambulatory Visit: Payer: Self-pay | Admitting: Internal Medicine

## 2019-08-15 NOTE — Telephone Encounter (Signed)
New Message   *STAT* If patient is at the pharmacy, call can be transferred to refill team.   1. Which medications need to be refilled? (please list name of each medication and dose if known) amLODipine (NORVASC) 5 MG tablet atorvastatin (LIPITOR) 40 MG tablet  2. Which pharmacy/location (including street and city if local pharmacy) is medication to be sent to? [facility] [place] [place] AT Portal to Registered [employer] Sites  3. Do they need a 30 day or 90 day supply? 90 day supply  Patient would like a call back to confirm once medication has been called in to the pharmacy.

## 2019-08-16 ENCOUNTER — Other Ambulatory Visit: Payer: Self-pay

## 2019-08-16 MED ORDER — ATORVASTATIN CALCIUM 40 MG PO TABS: 40.0000 mg | ORAL_TABLET | Freq: Every day | ORAL | 1 refills | Status: DC

## 2019-08-16 MED ORDER — AMLODIPINE BESYLATE 5 MG PO TABS: 5.0000 mg | ORAL_TABLET | Freq: Every day | ORAL | 1 refills | Status: DC

## 2019-08-16 NOTE — Telephone Encounter (Signed)
Refill for Lipitor and Amlodipine sent to pharmacy.

## 2019-08-22 DIAGNOSIS — E538 Deficiency of other specified B group vitamins: Secondary | ICD-10-CM | POA: Diagnosis not present

## 2019-09-04 ENCOUNTER — Telehealth: Payer: Self-pay | Admitting: Family Medicine

## 2019-09-04 NOTE — Progress Notes (Signed)
  Chronic Care Management   Outreach Note  09/04/2019 Name: Daryl Webb MRN: [id number] DOB: 1945-02-18  Referred by: [name], [name], MD Reason for referral : No chief complaint on file.   A second unsuccessful telephone outreach was attempted today. The patient was referred to pharmacist for assistance with care management and care coordination.  Follow Up Plan:   SIGNATURE

## 2019-09-05 DIAGNOSIS — L814 Other melanin hyperpigmentation: Secondary | ICD-10-CM | POA: Diagnosis not present

## 2019-09-05 DIAGNOSIS — L821 Other seborrheic keratosis: Secondary | ICD-10-CM | POA: Diagnosis not present

## 2019-09-05 DIAGNOSIS — D1801 Hemangioma of skin and subcutaneous tissue: Secondary | ICD-10-CM | POA: Diagnosis not present

## 2019-09-05 DIAGNOSIS — Z7984 Long term (current) use of oral hypoglycemic drugs: Secondary | ICD-10-CM | POA: Diagnosis not present

## 2019-09-05 DIAGNOSIS — L570 Actinic keratosis: Secondary | ICD-10-CM | POA: Diagnosis not present

## 2019-09-05 DIAGNOSIS — D0462 Carcinoma in situ of skin of left upper limb, including shoulder: Secondary | ICD-10-CM | POA: Diagnosis not present

## 2019-09-05 DIAGNOSIS — E119 Type 2 diabetes mellitus without complications: Secondary | ICD-10-CM | POA: Diagnosis not present

## 2019-09-05 DIAGNOSIS — N1831 Chronic kidney disease, stage 3a: Secondary | ICD-10-CM | POA: Diagnosis not present

## 2019-09-05 DIAGNOSIS — Z8639 Personal history of other endocrine, nutritional and metabolic disease: Secondary | ICD-10-CM | POA: Diagnosis not present

## 2019-09-05 DIAGNOSIS — D225 Melanocytic nevi of trunk: Secondary | ICD-10-CM | POA: Diagnosis not present

## 2019-09-05 DIAGNOSIS — Z85828 Personal history of other malignant neoplasm of skin: Secondary | ICD-10-CM | POA: Diagnosis not present

## 2019-09-06 ENCOUNTER — Telehealth: Payer: Self-pay | Admitting: Family Medicine

## 2019-09-06 NOTE — Progress Notes (Signed)
  Chronic Care Management   Outreach Note  09/06/2019 Name: DOLPH TAVANO MRN: [id number] DOB: February 17, 1945  Referred by: [name], [name], MD Reason for referral : No chief complaint on file.   Third unsuccessful telephone outreach was attempted today. The patient was referred to the pharmacist for assistance with care management and care coordination.   Follow Up Plan:   Carley Perdue [employer] Scheduler

## 2019-09-07 ENCOUNTER — Encounter: Payer: Self-pay | Admitting: Family Medicine

## 2019-09-07 ENCOUNTER — Telehealth: Payer: Self-pay | Admitting: Family Medicine

## 2019-09-07 NOTE — Progress Notes (Signed)
  Chronic Care Management   Note  09/07/2019 Name: GEROME KOKESH MRN: [id number] DOB: 06-12-1945  Talmage Coin Steffey is a 74 y.o. year old male who is a primary care patient of [name], [name], MD. I reached out to Alinda Money by phone today in response to a referral sent by Mr. Jes Costales Huxford's PCP, [name], [name], MD.   Mr. Mccarry was given information about Chronic Care Management services today including:  1. CCM service includes personalized support from designated clinical staff supervised by his physician, including individualized plan of care and coordination with other care providers 2. 24/7 contact phone numbers for assistance for urgent and routine care needs. 3. Service will only be billed when office clinical staff spend 20 minutes or more in a month to coordinate care. 4. Only one practitioner may furnish and bill the service in a calendar month. 5. The patient may stop CCM services at any time (effective at the end of the month) by phone call to the office staff.   Patient agreed to services and verbal consent obtained.   Follow up plan:   Carley Perdue [employer] Scheduler

## 2019-09-26 DIAGNOSIS — E538 Deficiency of other specified B group vitamins: Secondary | ICD-10-CM | POA: Diagnosis not present

## 2019-10-16 ENCOUNTER — Other Ambulatory Visit: Payer: Self-pay | Admitting: Internal Medicine

## 2019-10-29 DIAGNOSIS — E538 Deficiency of other specified B group vitamins: Secondary | ICD-10-CM | POA: Diagnosis not present

## 2019-11-07 DIAGNOSIS — Z23 Encounter for immunization: Secondary | ICD-10-CM | POA: Diagnosis not present

## 2019-11-19 ENCOUNTER — Encounter: Payer: Self-pay | Admitting: Family Medicine

## 2019-11-19 ENCOUNTER — Other Ambulatory Visit: Payer: Self-pay

## 2019-11-19 ENCOUNTER — Ambulatory Visit (INDEPENDENT_AMBULATORY_CARE_PROVIDER_SITE_OTHER): Payer: Medicare Other | Admitting: Family Medicine

## 2019-11-19 VITALS — BP 120/78 | HR 81 | Temp 98.0°F | Resp 16 | Ht 67.0 in | Wt 175.0 lb

## 2019-11-19 DIAGNOSIS — I350 Nonrheumatic aortic (valve) stenosis: Secondary | ICD-10-CM

## 2019-11-19 DIAGNOSIS — M159 Polyosteoarthritis, unspecified: Secondary | ICD-10-CM | POA: Diagnosis not present

## 2019-11-19 DIAGNOSIS — N1832 Chronic kidney disease, stage 3b: Secondary | ICD-10-CM | POA: Diagnosis not present

## 2019-11-19 DIAGNOSIS — I10 Essential (primary) hypertension: Secondary | ICD-10-CM

## 2019-11-19 NOTE — Assessment & Plan Note (Signed)
BP adequately controlled. Continue Losartan-HCTZ 50-12.5 mg,amlodipine 5 mg, and Metoprolol succinate 50 mg daily. Low salt diet. Eye exam is current.

## 2019-11-19 NOTE — Patient Instructions (Addendum)
A few things to remember from today's visit:   Essential hypertension - Plan: BASIC METABOLIC PANEL WITH GFR, CBC  Generalized osteoarthritis of multiple sites  Stage 3b chronic kidney disease ([place]) - Plan: Microalbumin / creatinine urine ratio, VITAMIN D 25 Hydroxy (Vit-D Deficiency, Fractures), CBC  If you need refills please call your pharmacy. Do not use My Chart to request refills or for acute issues that need immediate attention.  No changes today. Kidney function has been stable.   Please be sure medication list is accurate. If a new problem present, please set up appointment sooner than planned today.

## 2019-11-19 NOTE — Assessment & Plan Note (Addendum)
Problem has been stable. Cr 1.5 and e GFR mid 40's. He understands risk of chronic NSAID's, feels like benefit outweigh risk. Adequate hydration,BP and glucose control.  Low salt diet.

## 2019-11-19 NOTE — Assessment & Plan Note (Signed)
Moderate. Asymptomatic. Planning on echo in 02/2020. Following with cardiologist.

## 2019-11-19 NOTE — Assessment & Plan Note (Signed)
Stable. Continue Diclofenac. We will continue following on renal function. We have discussed some side effects of NSAID's.

## 2019-11-19 NOTE — Progress Notes (Signed)
HPI: [name] is a 74 y.o. male, who is here today for 6 months follow up.   He was last seen on 05/18/19. Since his last visit he has seen his endocrinologist. DM II on Metformin 500 mg bid. Last HgA1C 6.9 in 08/2019.  HTN: He is on Metoprolol succinate 50 mg daily and Losartan-HCTZ 50-12.5 mg daily. He does not check BP regularly but he can monitor it with his watch, 120's/70's.   Negative for severe/frequent headache, visual changes, chest pain, dyspnea, palpitation, claudication, focal weakness, or edema. Hx of AS, he follows with cardiologist. Echo 02/2019: LVEF 50-55% The left ventricle has low normal function. There is moderately increased left ventricular hypertrophy.  2. Moderate hypokinesis of the left ventricular, basal-mid septal wall and inferoseptal wall.  3. Elevated left atrial and left ventricular end-diastolic pressures.  4. Left ventricular diastolic parameters are consistent with Grade I diastolic dysfunction.  Negative for PND and orthopnea.  Stress test:  The left ventricular ejection fraction is moderately decreased (30-44%).  Nuclear stress EF is calculated at 41% but appears higher at around 50-55%.  Baseline EKG showed T wave inversions in the inferolateral leads with no change from baseline EKG during infusion.  The perfusion study is normal.  This is a low risk study.  CKD III: He has not noted gross hematuria,foam in urine,or decreased urine output.  Generalized OA: He is on Diclofenac 75 mg EC daily prn. She has on same medication for 20+ years. Shoulders,lower back,hips,and knees mainly. Medication is still helping with pain. He tried Tramadol,voltaren gel, and Tylenol but neither one helped. He   On 06/05/2019 A1c 6.9, creatinine 1.5, EGFR 44.  Review of Systems  Constitutional: Negative for activity change, appetite change, fatigue and fever.  HENT: Negative for mouth sores, nosebleeds and sore throat.   Respiratory:  Negative for cough and wheezing.   Cardiovascular: Negative for chest pain and palpitations.  Gastrointestinal: Negative for abdominal pain, nausea and vomiting.  Musculoskeletal: Negative for gait problem and joint swelling.  Skin: Negative for pallor and rash.  Neurological: Negative for syncope, facial asymmetry and weakness.  Psychiatric/Behavioral: Negative for confusion.  Rest of ROS, see pertinent positives sand negatives in HPI  Current Outpatient Medications on File Prior to Visit  Medication Sig Dispense Refill  . ACCU-CHEK SMARTVIEW test strip Use to test blood sugars once daily    . amLODipine (NORVASC) 5 MG tablet Take 1 tablet (5 mg total) by mouth daily. 90 tablet 1  . amoxicillin (AMOXIL) 500 MG capsule TAKE 4 CAPSULES 1 HOUR PRIOR TO APPT  2  . aspirin 81 MG tablet Take 81 mg by mouth daily.    . Aspirin-Calcium Carbonate 81-777 MG TABS Take by mouth.     Marland Kitchen atorvastatin (LIPITOR) 40 MG tablet Take 1 tablet (40 mg total) by mouth daily. 90 tablet 1  . BYDUREON BCISE 2 MG/0.85ML AUIJ Inject into the skin.    . Cholecalciferol 2000 UNITS CAPS Take 1 capsule by mouth daily. VITAMIN D 3    . Coenzyme Q10 (COQ10) 100 MG CAPS Take 100 mg by mouth daily.    . cyanocobalamin (,VITAMIN B-12,) 1000 MCG/ML injection Inject into the muscle every 30 (thirty) days.     . diclofenac (VOLTAREN) 75 MG EC tablet Take 1 tablet (75 mg total) by mouth daily as needed. 90 tablet 2  . loratadine (CLARITIN) 10 MG tablet Take 5 mg by mouth daily. As needed for allergies    .  losartan-hydrochlorothiazide (HYZAAR) 50-12.5 MG tablet TAKE 1 TABLET DAILY 90 tablet 3  . metFORMIN (GLUCOPHAGE) 500 MG tablet Take 500 mg by mouth 2 (two) times daily.    . metoprolol succinate (TOPROL-XL) 50 MG 24 hr tablet TAKE 1 TABLET DAILY 90 tablet 2  . Omega-3 Fatty Acids (FISH OIL) 1000 MG CAPS Take 1,000 mg by mouth daily.     No current facility-administered medications on file prior to visit.     Past Medical  History:  Diagnosis Date  . Anemia   . Arthritis   . Constipation due to pain medication    needs stool softner while on pain medication  . Diabetes mellitus   . Duodenal ulcer 08/13/2015   2014  - Dr Kaplan/GI  . H/O: osteoarthritis   . History of prostate cancer   . History of urethral stricture   . Hypercholesterolemia   . Hypertension   . Ulcer duodenal hemorrhage    No Known Allergies  Social History   Socioeconomic History  . Marital status: Married    Spouse name: Not on file  . Number of children: 2  . Years of education: Not on file  . Highest education level: Bachelor's degree (e.g., BA, AB, BS)  Occupational History  . Occupation: Retired  Tobacco Use  . Smoking status: Former Smoker    Packs/day: 1.50    Years: 20.00    Pack years: 30.00    Types: Cigarettes    Quit date: 02/09/1980    Years since quitting: 39.8  . Smokeless tobacco: Never Used  Vaping Use  . Vaping Use: Never used  Substance and Sexual Activity  . Alcohol use: No  . Drug use: No  . Sexual activity: Not Currently  Other Topics Concern  . 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    Lives with wife,    two children in [place].    Enjoys reading, boating, travelling to [place] in the winter with wife.   Social Determinants of Health   Financial Resource Strain: Low Risk   . Difficulty of Paying Living Expenses: Not hard at all  Food Insecurity: No Food Insecurity  . Worried About Charity fundraiser in the Last Year: Never true  . Ran Out of Food in the Last Year: Never true  Transportation Needs: No Transportation Needs  . Lack of Transportation (Medical): No  . Lack of Transportation (Non-Medical): No  Physical Activity: Inactive  . Days of Exercise per Week: 0 days  . Minutes of Exercise per Session: 0 min  Stress: No Stress Concern Present  . Feeling of Stress : Only a little  Social Connections: Unknown  . Frequency of Communication with  Friends and Family: Not on file  . Frequency of Social Gatherings with Friends and Family: Not on file  . Attends Religious Services: Not on file  . Active Member of Clubs or Organizations: Not on file  . Attends Archivist Meetings: Not on file  . Marital Status: Married   Vitals:   11/19/19 1000  BP: 120/78  Pulse: 81  Resp: 16  Temp: 98 F (36.7 C)  SpO2: 97%   Body mass index is 27.41 kg/m.  Physical Exam Vitals and nursing note reviewed.  Constitutional:      General: He is not in acute distress.    Appearance: He is well-developed.  HENT:     Head: Normocephalic and atraumatic.     Mouth/Throat:  Mouth: Mucous membranes are moist.     Pharynx: Oropharynx is clear.  Eyes:     Conjunctiva/sclera: Conjunctivae normal.     Pupils: Pupils are equal, round, and reactive to light.  Cardiovascular:     Rate and Rhythm: Normal rate and regular rhythm.     Pulses:          Dorsalis pedis pulses are 2+ on the right side and 2+ on the left side.     Heart sounds: Murmur (SEM II-III/VI RUSB ) heard.   Pulmonary:     Effort: Pulmonary effort is normal. No respiratory distress.     Breath sounds: Normal breath sounds.  Abdominal:     Palpations: Abdomen is soft. There is no hepatomegaly or mass.     Tenderness: There is no abdominal tenderness.  Musculoskeletal:     Right lower leg: 1+ Pitting Edema present.     Left lower leg: 1+ Pitting Edema present.  Lymphadenopathy:     Cervical: No cervical adenopathy.  Skin:    General: Skin is warm.     Findings: No erythema or rash.  Neurological:     Mental Status: He is alert and oriented to person, place, and time.     Cranial Nerves: No cranial nerve deficit.     Gait: Gait normal.  Psychiatric:        Mood and Affect: Mood and affect normal.     Comments: Well groomed, good eye contact.   ASSESSMENT AND PLAN:   [name] was seen today for 6 months follow-up.  Orders Placed This  Encounter  Procedures  . BASIC METABOLIC PANEL WITH GFR  . Microalbumin / creatinine urine ratio  . VITAMIN D 25 Hydroxy (Vit-D Deficiency, Fractures)  . CBC   Lab Results  Component Value Date   WBC 8.2 11/19/2019   HGB 13.6 11/19/2019   HCT 40.9 11/19/2019   MCV 100.7 (H) 11/19/2019   PLT 234 11/19/2019   Lab Results  Component Value Date   CREATININE 1.57 (H) 11/19/2019   BUN 31 (H) 11/19/2019   NA 138 11/19/2019   K 5.1 11/19/2019   CL 104 11/19/2019   CO2 28 11/19/2019   Lab Results  Component Value Date   MICROALBUR 18.9 11/19/2019   MICROALBUR 84.2 01/06/2015    Essential hypertension BP adequately controlled. Continue Losartan-HCTZ 50-12.5 mg,amlodipine 5 mg, and Metoprolol succinate 50 mg daily. Low salt diet. Eye exam is current.  Generalized osteoarthritis of multiple sites Stable. Continue Diclofenac. We will continue following on renal function. We have discussed some side effects of NSAID's.  CKD (chronic kidney disease), stage III ([place]) Problem has been stable. Cr 1.5 and e GFR mid 40's. He understands risk of chronic NSAID's, feels like benefit outweigh risk. Adequate hydration,BP and glucose control.  Low salt diet.  Aortic valve stenosis Moderate. Asymptomatic. Planning on echo in 02/2020. Following with cardiologist.    Return in about 6 months (around 05/19/2020).   Mehul Rudin G. [name], MD  [facility]. [place] office.   A few things to remember from today's visit:   Essential hypertension - Plan: BASIC METABOLIC PANEL WITH GFR, CBC  Generalized osteoarthritis of multiple sites  Stage 3b chronic kidney disease ([place]) - Plan: Microalbumin / creatinine urine ratio, VITAMIN D 25 Hydroxy (Vit-D Deficiency, Fractures), CBC  If you need refills please call your pharmacy. Do not use My Chart to request refills or for acute issues that need immediate attention.  No  changes today. Kidney function has been stable.   Please  be sure medication list is accurate. If a new problem present, please set up appointment sooner than planned today.

## 2019-11-20 LAB — MICROALBUMIN / CREATININE URINE RATIO
Creatinine, Urine: 63 mg/dL (ref 20–320)
Microalb Creat Ratio: 300 mcg/mg creat — ABNORMAL HIGH (ref <30)
Microalb, Ur: 18.9 mg/dL

## 2019-11-20 LAB — CBC
HCT: 40.9 % (ref 38.5–50.0)
Hemoglobin: 13.6 g/dL (ref 13.2–17.1)
MCH: 33.5 pg — ABNORMAL HIGH (ref 27.0–33.0)
MCHC: 33.3 g/dL (ref 32.0–36.0)
MCV: 100.7 fL — ABNORMAL HIGH (ref 80.0–100.0)
MPV: 9.9 fL (ref 7.5–12.5)
Platelets: 234 10*3/uL (ref 140–400)
RBC: 4.06 10*6/uL — ABNORMAL LOW (ref 4.20–5.80)
RDW: 13 % (ref 11.0–15.0)
WBC: 8.2 10*3/uL (ref 3.8–10.8)

## 2019-11-20 LAB — BASIC METABOLIC PANEL WITH GFR
BUN/Creatinine Ratio: 20 (calc) (ref 6–22)
BUN: 31 mg/dL — ABNORMAL HIGH (ref 7–25)
CO2: 28 mmol/L (ref 20–32)
Calcium: 9.8 mg/dL (ref 8.6–10.3)
Chloride: 104 mmol/L (ref 98–110)
Creat: 1.57 mg/dL — ABNORMAL HIGH (ref 0.70–1.18)
GFR, Est African American: 50 mL/min/{1.73_m2} — ABNORMAL LOW (ref >=60)
GFR, Est Non African American: 43 mL/min/{1.73_m2} — ABNORMAL LOW (ref >=60)
Glucose, Bld: 205 mg/dL — ABNORMAL HIGH (ref 65–99)
Potassium: 5.1 mmol/L (ref 3.5–5.3)
Sodium: 138 mmol/L (ref 135–146)

## 2019-11-20 LAB — VITAMIN D 25 HYDROXY (VIT D DEFICIENCY, FRACTURES): Vit D, 25-Hydroxy: 39 ng/mL (ref 30–100)

## 2019-11-23 DIAGNOSIS — Z23 Encounter for immunization: Secondary | ICD-10-CM | POA: Diagnosis not present

## 2019-11-27 ENCOUNTER — Telehealth: Payer: Medicare Other

## 2019-11-27 ENCOUNTER — Telehealth: Payer: Self-pay

## 2019-11-27 NOTE — Progress Notes (Signed)
Left voice message to confirmed patient telephone appointment on 11/28/2019 for CCM at 4:00 pm with Junius Argyle the Clinical pharmacist.   [place] Pharmacist Assistant [phone]

## 2019-11-28 ENCOUNTER — Telehealth: Payer: Medicare Other

## 2019-12-07 DIAGNOSIS — E538 Deficiency of other specified B group vitamins: Secondary | ICD-10-CM | POA: Diagnosis not present

## 2019-12-21 DIAGNOSIS — Z8639 Personal history of other endocrine, nutritional and metabolic disease: Secondary | ICD-10-CM | POA: Diagnosis not present

## 2019-12-21 DIAGNOSIS — E1122 Type 2 diabetes mellitus with diabetic chronic kidney disease: Secondary | ICD-10-CM | POA: Diagnosis not present

## 2019-12-21 DIAGNOSIS — N1832 Chronic kidney disease, stage 3b: Secondary | ICD-10-CM | POA: Diagnosis not present

## 2019-12-28 ENCOUNTER — Other Ambulatory Visit: Payer: Self-pay | Admitting: Family Medicine

## 2019-12-28 DIAGNOSIS — M159 Polyosteoarthritis, unspecified: Secondary | ICD-10-CM

## 2020-01-07 DIAGNOSIS — E119 Type 2 diabetes mellitus without complications: Secondary | ICD-10-CM | POA: Diagnosis not present

## 2020-01-07 DIAGNOSIS — Z7984 Long term (current) use of oral hypoglycemic drugs: Secondary | ICD-10-CM | POA: Diagnosis not present

## 2020-01-07 DIAGNOSIS — H25013 Cortical age-related cataract, bilateral: Secondary | ICD-10-CM | POA: Diagnosis not present

## 2020-01-07 DIAGNOSIS — H2513 Age-related nuclear cataract, bilateral: Secondary | ICD-10-CM | POA: Diagnosis not present

## 2020-01-10 DIAGNOSIS — E538 Deficiency of other specified B group vitamins: Secondary | ICD-10-CM | POA: Diagnosis not present

## 2020-01-30 ENCOUNTER — Other Ambulatory Visit: Payer: Self-pay | Admitting: Internal Medicine

## 2020-02-13 DIAGNOSIS — E538 Deficiency of other specified B group vitamins: Secondary | ICD-10-CM | POA: Diagnosis not present

## 2020-03-04 ENCOUNTER — Telehealth: Payer: Self-pay | Admitting: Internal Medicine

## 2020-03-04 DIAGNOSIS — I350 Nonrheumatic aortic (valve) stenosis: Secondary | ICD-10-CM

## 2020-03-04 NOTE — Telephone Encounter (Signed)
Echo ordered LM for patient that he should expect a call to set this appointment up, along with 1 year visit

## 2020-03-04 NOTE — Telephone Encounter (Signed)
Daryl Webb is calling stating he normally has an Echo before his 1 year f/u to discuss the results during the appt, but there is no active request in order to schedule. He is requesting a callback once the order is placed to schedule the Echo and 1 year f/u. Please advise.

## 2020-03-04 NOTE — Telephone Encounter (Signed)
Ok to order repeat echo follow-up for moderate aortic stenosis.  Dr Lemmie Evens

## 2020-03-04 NOTE — Telephone Encounter (Signed)
Routed to MD to advise if echo is needed

## 2020-03-07 DIAGNOSIS — L570 Actinic keratosis: Secondary | ICD-10-CM | POA: Diagnosis not present

## 2020-03-07 DIAGNOSIS — C44329 Squamous cell carcinoma of skin of other parts of face: Secondary | ICD-10-CM | POA: Diagnosis not present

## 2020-03-07 DIAGNOSIS — L821 Other seborrheic keratosis: Secondary | ICD-10-CM | POA: Diagnosis not present

## 2020-03-07 DIAGNOSIS — Z85828 Personal history of other malignant neoplasm of skin: Secondary | ICD-10-CM | POA: Diagnosis not present

## 2020-03-07 DIAGNOSIS — D1801 Hemangioma of skin and subcutaneous tissue: Secondary | ICD-10-CM | POA: Diagnosis not present

## 2020-03-07 DIAGNOSIS — L814 Other melanin hyperpigmentation: Secondary | ICD-10-CM | POA: Diagnosis not present

## 2020-03-13 ENCOUNTER — Ambulatory Visit: Payer: Self-pay

## 2020-03-19 DIAGNOSIS — E538 Deficiency of other specified B group vitamins: Secondary | ICD-10-CM | POA: Diagnosis not present

## 2020-03-26 ENCOUNTER — Other Ambulatory Visit: Payer: Self-pay

## 2020-03-26 ENCOUNTER — Ambulatory Visit (HOSPITAL_COMMUNITY): Payer: Medicare Other | Attending: Cardiology

## 2020-03-26 DIAGNOSIS — I350 Nonrheumatic aortic (valve) stenosis: Secondary | ICD-10-CM | POA: Diagnosis not present

## 2020-03-26 LAB — ECHOCARDIOGRAM COMPLETE
AR max vel: 0.81 cm2
AV Area VTI: 0.99 cm2
AV Area mean vel: 0.78 cm2
AV Mean grad: 34 mmHg
AV Peak grad: 64 mmHg
Ao pk vel: 4 m/s
Area-P 1/2: 3.47 cm2
P 1/2 time: 751 msec
S' Lateral: 3.5 cm

## 2020-04-01 ENCOUNTER — Telehealth: Payer: Self-pay | Admitting: Internal Medicine

## 2020-04-01 NOTE — Telephone Encounter (Signed)
[name], MD  03/27/2020 11:43 AM EST      Echo now shows severe AS - mean gradient 34 mmHg, AVA 0.99. Will need to correlate with symptoms. LVEF is low normal. Follow-up as scheduled on 3/25.  Dr. Lemmie Evens     Patient aware of results and verbalized understanding.  He is doing well and will plan to keep him follow up as scheduled 3/25.  He is aware to call back if anything changes prior.

## 2020-04-01 NOTE — Telephone Encounter (Signed)
Patient returning call for echo results. He states he will be leaving and that it is okay to leave a vm with the results.

## 2020-04-15 ENCOUNTER — Telehealth: Payer: Self-pay | Admitting: Family Medicine

## 2020-04-15 NOTE — Telephone Encounter (Signed)
Left message for patient to call back and schedule Medicare Annual Wellness Visit (AWV) either virtually or in office. No detailed message left   Last AWV 03/13/19  please schedule at anytime with [facility] Nurse Health Advisor 1 or 2   This should be a 45 minute visit.

## 2020-04-22 DIAGNOSIS — E538 Deficiency of other specified B group vitamins: Secondary | ICD-10-CM | POA: Diagnosis not present

## 2020-05-02 ENCOUNTER — Other Ambulatory Visit: Payer: Self-pay

## 2020-05-02 ENCOUNTER — Encounter: Payer: Self-pay | Admitting: Internal Medicine

## 2020-05-02 ENCOUNTER — Ambulatory Visit (INDEPENDENT_AMBULATORY_CARE_PROVIDER_SITE_OTHER): Payer: Medicare Other | Admitting: Internal Medicine

## 2020-05-02 VITALS — BP 122/70 | HR 74 | Ht 67.0 in | Wt 173.0 lb

## 2020-05-02 DIAGNOSIS — Z01812 Encounter for preprocedural laboratory examination: Secondary | ICD-10-CM | POA: Diagnosis not present

## 2020-05-02 DIAGNOSIS — E782 Mixed hyperlipidemia: Secondary | ICD-10-CM

## 2020-05-02 DIAGNOSIS — I350 Nonrheumatic aortic (valve) stenosis: Secondary | ICD-10-CM

## 2020-05-02 DIAGNOSIS — E119 Type 2 diabetes mellitus without complications: Secondary | ICD-10-CM

## 2020-05-02 DIAGNOSIS — I10 Essential (primary) hypertension: Secondary | ICD-10-CM

## 2020-05-02 LAB — CBC
Hematocrit: 39.9 % (ref 37.5–51.0)
Hemoglobin: 13.6 g/dL (ref 13.0–17.7)
MCH: 33.2 pg — ABNORMAL HIGH (ref 26.6–33.0)
MCHC: 34.1 g/dL (ref 31.5–35.7)
MCV: 97 fL (ref 79–97)
Platelets: 204 10*3/uL (ref 150–450)
RBC: 4.1 x10E6/uL — ABNORMAL LOW (ref 4.14–5.80)
RDW: 12.8 % (ref 11.6–15.4)
WBC: 7.7 10*3/uL (ref 3.4–10.8)

## 2020-05-02 LAB — BASIC METABOLIC PANEL
BUN/Creatinine Ratio: 17 (ref 10–24)
BUN: 25 mg/dL (ref 8–27)
CO2: 22 mmol/L (ref 20–29)
Calcium: 9.6 mg/dL (ref 8.6–10.2)
Chloride: 103 mmol/L (ref 96–106)
Creatinine, Ser: 1.51 mg/dL — ABNORMAL HIGH (ref 0.76–1.27)
Glucose: 163 mg/dL — ABNORMAL HIGH (ref 65–99)
Potassium: 4.2 mmol/L (ref 3.5–5.2)
Sodium: 140 mmol/L (ref 134–144)
eGFR: 48 mL/min/{1.73_m2} — ABNORMAL LOW (ref >59)

## 2020-05-02 LAB — SPECIMEN STATUS REPORT

## 2020-05-02 NOTE — Patient Instructions (Addendum)
Medication Instructions:  Your physician recommends that you continue on your current medications as directed. Please refer to the Current Medication list given to you today.  *If you need a refill on your cardiac medications before your next appointment, please call your pharmacy*   Lab Work: BMET & CBC prior to cardiac catheterization   You will need to have the coronavirus test completed prior to your procedure. An appointment has been made at 10:35am on 05/06/2020. This is a Drive Up Visit at the [address] [place]. Please tell them that you are there for pre-procedure testing. Someone will direct you to the appropriate testing line. Stay in your car and someone will be with you shortly. Please make sure to have all other labs completed before this test because you will need to stay quarantined until your procedure. Please take your insurance card to this test.   If you have labs (blood work) drawn today and your tests are completely normal, you will receive your results only by: Marland Kitchen MyChart Message (if you have MyChart) OR . A paper copy in the mail If you have any lab test that is abnormal or we need to change your treatment, we will call you to review the results.   Testing/Procedures: Right & Left Cardiac Catheterization @ [facility]   Follow-Up: At [facility], you and your health needs are our priority.  As part of our continuing mission to provide you with exceptional heart care, we have created designated Provider Care Teams.  These Care Teams include your primary Cardiologist (physician) and Advanced Practice Providers (APPs -  Physician Assistants and Nurse Practitioners) who all work together to provide you with the care you need, when you need it.  We recommend signing up for the patient portal called "MyChart".  Sign up information is provided on this After Visit Summary.  MyChart is used to connect with patients for Virtual Visits (Telemedicine).  Patients  are able to view lab/test results, encounter notes, upcoming appointments, etc.  Non-urgent messages can be sent to your provider as well.   To learn more about what you can do with MyChart, go to [email or web address].    Your next appointment:   3-4 weeks after procedure  The format for your next appointment:   In Person  Provider:   You may see [name], MD or one of the following Advanced Practice Providers on your designated Care Team:    Almyra Deforest, PA-C  Fabian Sharp, [place] or   Roby Lofts, [place]    Other Instructions    [place] [address] Dept: [phone] Loc: [phone]  Tamara Kenyon Simpson  05/02/2020  You are scheduled for a Cardiac Catheterization on Thursday, March 31 with Dr. [name].  1. Please arrive at the [facility] (Main Entrance A) at [facility]: [address] [place], [place] [address] at 7:30 AM (This time is two hours before your procedure to ensure your preparation). Free valet parking service is available.   Special note: Every effort is made to have your procedure done on time. Please understand that emergencies sometimes delay scheduled procedures.  2. Diet: Do not eat solid foods after midnight.  The patient may have clear liquids until 5am upon the day of the procedure.  3. Labs: You will need to have blood drawn TODAY  4. Medication instructions in preparation for your procedure:  Do  not take Diabetes Med Glucophage (Metformin) on the day of the procedure and HOLD 48 HOURS AFTER THE PROCEDURE.  On the morning of your procedure, take your Aspirin and any morning medicines NOT listed above.  You may use sips of water.  5. Plan for one night stay--bring personal belongings. 6. Bring a current list of your medications and current insurance cards. 7. You MUST have a responsible person to drive you  home. 8. Someone MUST be with you the first 24 hours after you arrive home or your discharge will be delayed. 9. Please wear clothes that are easy to get on and off and wear slip-on shoes.  Thank you for allowing [place] to care for you!   -- [place] [facility]

## 2020-05-02 NOTE — Progress Notes (Signed)
OFFICE NOTE  Chief Complaint:  Follow-up echo  Primary Care Physician: [name], [name], MD  HPI:  Daryl Webb is a 75 y.o. male followed by Dr. Mare Ferrari in the past for hypertension, dyslipidemia, type 2 diabetes and mild aortic sclerosis. His last echocardiogram was in 2015 which showed normal LV function and mild aortic sclerosis/borderline stenosis. Recent lab work was performed by myself indicating hemoglobin A1c of 6.7, creatinine 1.4 however he had a recent UTI, and cholesterol profile which was favorable with LDL 70 and total cholesterol 123. He is on atorvastatin 40 mg daily. He has no history of known coronary artery disease.  02/12/2016  Daryl Webb returns today for follow-up. He recently established a new primary care provider with Dr. Betty [name]. She performs some lab work including cholesterol which appears to be well-controlled with total cholesterol 113, LDL 58, triglycerides 130 and HDL of 29, which is increased from 24 in May 2017. He denies any new symptoms of chest pain or worsening shortness of breath. He does have a early peaking systolic murmur suggestive of mild aortic stenosis. This was last seen on echo in 2015. He is also describing pain in the left flank which radiates into the left groin. He was treated for possible UTI/prostatitis and had improvement in some urinary symptoms but has persistent pain. A CT scan was performed recently, which was essentially unremarkable. He reports his pain is worse when rising from either sitting or lying position up to standing and with walking. This could suggest some compression perhaps of the thoracic or lumbar spine or even symptoms suggestive of a possible hernia.   08/18/2016  Daryl Webb is seen today in follow-up. Overall he seems to be doing pretty well. He had an echocardiogram in January 2018 which now shows moderate aortic stenosis. This is worsened since his prior study in 2015. He has type 2 diabetes  and A1c is 6.7. Blood pressure is well-controlled today 122/60. Recent lipid profile showed an LDL-C of 58, which is at goal. He's asymptomatic, denies any chest pain or worsening shortness of breath. He does do some walking although says he can exercise more frequently.  02/23/2017  Daryl Webb was seen today in follow-up.  He had a recent echocardiogram which showed mild worsening of his aortic stenosis.  His mean gradient went from 22-24 mmHg.  This is still moderate left ear.  He has generally good blood pressure control.  Initially blood pressure is elevated today 142/86 however came down to 124/78.  His diabetes has been fairly well controlled.  He is followed by Dr. Buddy Duty for this.  Recent lipid profile showed an LDL-C of 58 on high intensity atorvastatin.  10/10/2018  Daryl Webb is seen today in follow-up.  Overall he is doing well.  He had a repeat echo in January of this year which showed slight worsening of his aortic stenosis.  His mean aortic gradient is increased from 24 to 30 mmHg putting it in the moderate to severe stenosis area.  He denies any chest pain or worsening shortness of breath.  He has had no syncopal events.  Diabetes is well controlled and he is followed by Dr. Buddy Duty.  His blood pressure is at goal today.  His lipids have been well managed.  His EKG shows sinus rhythm with some PACs today at 87.  04/28/2019  Daryl Webb returns for follow-up.  He recently had a repeat echo which I personally reviewed.  This shows a stable aortic  valve gradient around 48mmHg more consistent with moderate aortic stenosis.  A new finding, however was some inferior hypokinesis with a low normal EF 50 to 55%.  He denies any anginal symptoms.  This is reduced from previous EF of 65%.  EKG today shows sinus rhythm with some PACs however there are inferior and lateral T wave changes possibly suggestive of ischemia.  He says he has been less active over the past year but really denies any anginal  symptoms.  05/02/2020  Daryl Webb returns today for follow-up of his echo to assess aortic stenosis.  Been just over a year since his last study.  This echo however shows some significant progression of his aortic stenosis.  LVEF has dropped slightly to 50 to 55%.  It is now suggested that he has severe aortic stenosis with a mean gradient of 34 mmHg and valve area of 0.99.  There is grade 2 diastolic dysfunction.  His dimensionless index is 0.26.  I discussed this with him and his wife who was joining [place] via telephone conference during the office visit today.  She notes that he has been more short of breath but has not had recent chest pain.  He seems to downplay it somewhat.  He says he has been less active.  PMHx:  Past Medical History:  Diagnosis Date  . Anemia   . Arthritis   . Constipation due to pain medication    needs stool softner while on pain medication  . Diabetes mellitus   . Duodenal ulcer 08/13/2015   2014  - Dr Kaplan/GI  . H/O: osteoarthritis   . History of prostate cancer   . History of urethral stricture   . Hypercholesterolemia   . Hypertension   . Ulcer duodenal hemorrhage     Past Surgical History:  Procedure Laterality Date  . ARTHROPLASTY  2004   right knee Dr. Wynelle Link  . CARPAL TUNNEL RELEASE     right hand,wrist [name]  . CYSTOSCOPY N/A 09/20/2012   Procedure: CYSTOSCOPY;  Surgeon: [name], MD;  Location: [facility];  Service: Urology;  Laterality: N/A;  . JOINT REPLACEMENT Right   . KNEE ARTHROSCOPY Right   . LUMBAR LAMINECTOMY  02/08/2013  . PROSTATECTOMY     radical  . VASECTOMY  1985    FAMHx:  Family History  Problem Relation Age of Onset  . Heart disease Mother   . Heart failure Mother   . Heart disease Father   . Diabetes Father     SOCHx:   reports that he quit smoking about 40 years ago. His smoking use included cigarettes. He has a 30.00 pack-year smoking history. He has never used smokeless tobacco. He reports that he  does not drink alcohol and does not use drugs.  ALLERGIES:  No Known Allergies  ROS: Pertinent items noted in HPI and remainder of comprehensive ROS otherwise negative.  HOME MEDS: Current Outpatient Medications  Medication Sig Dispense Refill  . ACCU-CHEK SMARTVIEW test strip Use to test blood sugars once daily    . amLODipine (NORVASC) 5 MG tablet TAKE 1 TABLET DAILY 90 tablet 1  . amoxicillin (AMOXIL) 500 MG capsule TAKE 4 CAPSULES 1 HOUR PRIOR TO APPT  2  . aspirin 81 MG tablet Take 81 mg by mouth daily.    . Aspirin-Calcium Carbonate 81-777 MG TABS Take by mouth.     Marland Kitchen atorvastatin (LIPITOR) 40 MG tablet TAKE 1 TABLET DAILY 90 tablet 1  . BYDUREON BCISE 2  MG/0.85ML AUIJ Inject into the skin.    . Cholecalciferol 2000 UNITS CAPS Take 1 capsule by mouth daily. VITAMIN D 3    . Coenzyme Q10 (COQ10) 100 MG CAPS Take 100 mg by mouth daily.    . cyanocobalamin (,VITAMIN B-12,) 1000 MCG/ML injection Inject into the muscle every 30 (thirty) days.     . diclofenac (VOLTAREN) 75 MG EC tablet TAKE 1 TABLET DAILY AS     NEEDED 90 tablet 2  . loratadine (CLARITIN) 10 MG tablet Take 5 mg by mouth daily. As needed for allergies    . losartan-hydrochlorothiazide (HYZAAR) 50-12.5 MG tablet TAKE 1 TABLET DAILY 90 tablet 3  . metFORMIN (GLUCOPHAGE) 500 MG tablet Take 500 mg by mouth 2 (two) times daily.    . metoprolol succinate (TOPROL-XL) 50 MG 24 hr tablet TAKE 1 TABLET DAILY 90 tablet 2  . Omega-3 Fatty Acids (FISH OIL) 1000 MG CAPS Take 1,000 mg by mouth daily.     No current facility-administered medications for this visit.    LABS/IMAGING: No results found for this or any previous visit (from the past 48 hour(s)). No results found.  WEIGHTS: Wt Readings from Last 3 Encounters:  05/02/20 173 lb (78.5 kg)  11/19/19 175 lb (79.4 kg)  07/04/19 173 lb (78.5 kg)    VITALS: BP 122/70 (BP Location: Left Arm, Patient Position: Sitting, Cuff Size: Normal)   Pulse 74   Ht 5\' 7"  (1.702 m)    Wt 173 lb (78.5 kg)   BMI 27.10 kg/m   EXAM: General appearance: alert and no distress Neck: no carotid bruit, no JVD and thyroid not enlarged, symmetric, no tenderness/mass/nodules Lungs: clear to auscultation bilaterally Heart: regular rate and rhythm, S1, S2 normal and systolic murmur: Late systolic 3/6, crescendo at 2nd right intercostal space Abdomen: soft, non-tender; bowel sounds normal; no masses,  no organomegaly Extremities: extremities normal, atraumatic, no cyanosis or edema Pulses: 2+ and symmetric Skin: Skin color, texture, turgor normal. No rashes or lesions Neurologic: Grossly normal Psych: Pleasant  EKG: Normal sinus rhythm with sinus arrhythmia at 74, incomplete left bundle branch block, LVH by voltage-personally reviewed  ASSESSMENT: 1. Low normal EF 50 to 55%, inferior hypokinesis, ischemic EKG changes 2. Hypertension-at goal 3. Dyslipidemia-at goal LDL<70 (52) 4. Diabetes type 2-A1c 7.0, followed by Dr. Buddy Duty 5. Severe symptomatic aortic stenosis - mean gradient 34 mmHg, AVA 0.99 cm2 (03/2020) 6. DOE  PLAN: 1.   Daryl Webb was found to have severe aortic stenosis on his echo.  He has a second heart sound but it is soft.  He may have more moderately severe AS.  His valve area was just under 1 cm.  He is however symptomatic with progressive dyspnea on exertion.  He could also have underlying coronary disease.  I would recommend left and right heart catheterization to further address his gradient and evaluate his coronaries.  If he is not quite severe at this point then we could continue to follow it in our multidisciplinary valve clinic but I suspect he will need to have valve replacement in the very near future.  I discussed right and left heart catheterization with him today including the procedure in detail and the risk, benefits and alternatives with him and he is agreeable to proceed and understood those risks. Plan follow-up with me afterwards.  [name], MD, [facility], [place] Director of the Advanced Lipid Disorders &  Cardiovascular Risk Reduction Clinic Diplomate  of the [employer] of Clinical Lipidology Attending Cardiologist  Direct Dial: [phone]  Fax: [phone]  Website:  [email or web address] 05/02/2020, 10:52 AM

## 2020-05-02 NOTE — H&P (View-Only) (Signed)
OFFICE NOTE  Chief Complaint:  Follow-up echo  Primary Care Physician: [name], [name], MD  HPI:  Daryl Webb is a 75 y.o. male followed by Dr. Mare Ferrari in the past for hypertension, dyslipidemia, type 2 diabetes and mild aortic sclerosis. His last echocardiogram was in 2015 which showed normal LV function and mild aortic sclerosis/borderline stenosis. Recent lab work was performed by myself indicating hemoglobin A1c of 6.7, creatinine 1.4 however he had a recent UTI, and cholesterol profile which was favorable with LDL 70 and total cholesterol 123. He is on atorvastatin 40 mg daily. He has no history of known coronary artery disease.  02/12/2016  Daryl Webb returns today for follow-up. He recently established a new primary care provider with Dr. Betty [name]. She performs some lab work including cholesterol which appears to be well-controlled with total cholesterol 113, LDL 58, triglycerides 130 and HDL of 29, which is increased from 24 in May 2017. He denies any new symptoms of chest pain or worsening shortness of breath. He does have a early peaking systolic murmur suggestive of mild aortic stenosis. This was last seen on echo in 2015. He is also describing pain in the left flank which radiates into the left groin. He was treated for possible UTI/prostatitis and had improvement in some urinary symptoms but has persistent pain. A CT scan was performed recently, which was essentially unremarkable. He reports his pain is worse when rising from either sitting or lying position up to standing and with walking. This could suggest some compression perhaps of the thoracic or lumbar spine or even symptoms suggestive of a possible hernia.   08/18/2016  Daryl Webb is seen today in follow-up. Overall he seems to be doing pretty well. He had an echocardiogram in January 2018 which now shows moderate aortic stenosis. This is worsened since his prior study in 2015. He has type 2 diabetes  and A1c is 6.7. Blood pressure is well-controlled today 122/60. Recent lipid profile showed an LDL-C of 58, which is at goal. He's asymptomatic, denies any chest pain or worsening shortness of breath. He does do some walking although says he can exercise more frequently.  02/23/2017  Daryl Webb was seen today in follow-up.  He had a recent echocardiogram which showed mild worsening of his aortic stenosis.  His mean gradient went from 22-24 mmHg.  This is still moderate left ear.  He has generally good blood pressure control.  Initially blood pressure is elevated today 142/86 however came down to 124/78.  His diabetes has been fairly well controlled.  He is followed by Dr. Buddy Duty for this.  Recent lipid profile showed an LDL-C of 58 on high intensity atorvastatin.  10/10/2018  Daryl Webb is seen today in follow-up.  Overall he is doing well.  He had a repeat echo in January of this year which showed slight worsening of his aortic stenosis.  His mean aortic gradient is increased from 24 to 30 mmHg putting it in the moderate to severe stenosis area.  He denies any chest pain or worsening shortness of breath.  He has had no syncopal events.  Diabetes is well controlled and he is followed by Dr. Buddy Duty.  His blood pressure is at goal today.  His lipids have been well managed.  His EKG shows sinus rhythm with some PACs today at 87.  04/28/2019  Daryl Webb returns for follow-up.  He recently had a repeat echo which I personally reviewed.  This shows a stable aortic  valve gradient around 29mmHg more consistent with moderate aortic stenosis.  A new finding, however was some inferior hypokinesis with a low normal EF 50 to 55%.  He denies any anginal symptoms.  This is reduced from previous EF of 65%.  EKG today shows sinus rhythm with some PACs however there are inferior and lateral T wave changes possibly suggestive of ischemia.  He says he has been less active over the past year but really denies any anginal  symptoms.  05/02/2020  Daryl Webb returns today for follow-up of his echo to assess aortic stenosis.  Been just over a year since his last study.  This echo however shows some significant progression of his aortic stenosis.  LVEF has dropped slightly to 50 to 55%.  It is now suggested that he has severe aortic stenosis with a mean gradient of 34 mmHg and valve area of 0.99.  There is grade 2 diastolic dysfunction.  His dimensionless index is 0.26.  I discussed this with him and his wife who was joining [place] via telephone conference during the office visit today.  She notes that he has been more short of breath but has not had recent chest pain.  He seems to downplay it somewhat.  He says he has been less active.  PMHx:  Past Medical History:  Diagnosis Date  . Anemia   . Arthritis   . Constipation due to pain medication    needs stool softner while on pain medication  . Diabetes mellitus   . Duodenal ulcer 08/13/2015   2014  - Dr Kaplan/GI  . H/O: osteoarthritis   . History of prostate cancer   . History of urethral stricture   . Hypercholesterolemia   . Hypertension   . Ulcer duodenal hemorrhage     Past Surgical History:  Procedure Laterality Date  . ARTHROPLASTY  2004   right knee Dr. Wynelle Link  . CARPAL TUNNEL RELEASE     right hand,wrist [name]  . CYSTOSCOPY N/A 09/20/2012   Procedure: CYSTOSCOPY;  Surgeon: [name], MD;  Location: [facility];  Service: Urology;  Laterality: N/A;  . JOINT REPLACEMENT Right   . KNEE ARTHROSCOPY Right   . LUMBAR LAMINECTOMY  02/08/2013  . PROSTATECTOMY     radical  . VASECTOMY  1985    FAMHx:  Family History  Problem Relation Age of Onset  . Heart disease Mother   . Heart failure Mother   . Heart disease Father   . Diabetes Father     SOCHx:   reports that he quit smoking about 40 years ago. His smoking use included cigarettes. He has a 30.00 pack-year smoking history. He has never used smokeless tobacco. He reports that he  does not drink alcohol and does not use drugs.  ALLERGIES:  No Known Allergies  ROS: Pertinent items noted in HPI and remainder of comprehensive ROS otherwise negative.  HOME MEDS: Current Outpatient Medications  Medication Sig Dispense Refill  . ACCU-CHEK SMARTVIEW test strip Use to test blood sugars once daily    . amLODipine (NORVASC) 5 MG tablet TAKE 1 TABLET DAILY 90 tablet 1  . amoxicillin (AMOXIL) 500 MG capsule TAKE 4 CAPSULES 1 HOUR PRIOR TO APPT  2  . aspirin 81 MG tablet Take 81 mg by mouth daily.    . Aspirin-Calcium Carbonate 81-777 MG TABS Take by mouth.     Marland Kitchen atorvastatin (LIPITOR) 40 MG tablet TAKE 1 TABLET DAILY 90 tablet 1  . BYDUREON BCISE 2  MG/0.85ML AUIJ Inject into the skin.    . Cholecalciferol 2000 UNITS CAPS Take 1 capsule by mouth daily. VITAMIN D 3    . Coenzyme Q10 (COQ10) 100 MG CAPS Take 100 mg by mouth daily.    . cyanocobalamin (,VITAMIN B-12,) 1000 MCG/ML injection Inject into the muscle every 30 (thirty) days.     . diclofenac (VOLTAREN) 75 MG EC tablet TAKE 1 TABLET DAILY AS     NEEDED 90 tablet 2  . loratadine (CLARITIN) 10 MG tablet Take 5 mg by mouth daily. As needed for allergies    . losartan-hydrochlorothiazide (HYZAAR) 50-12.5 MG tablet TAKE 1 TABLET DAILY 90 tablet 3  . metFORMIN (GLUCOPHAGE) 500 MG tablet Take 500 mg by mouth 2 (two) times daily.    . metoprolol succinate (TOPROL-XL) 50 MG 24 hr tablet TAKE 1 TABLET DAILY 90 tablet 2  . Omega-3 Fatty Acids (FISH OIL) 1000 MG CAPS Take 1,000 mg by mouth daily.     No current facility-administered medications for this visit.    LABS/IMAGING: No results found for this or any previous visit (from the past 48 hour(s)). No results found.  WEIGHTS: Wt Readings from Last 3 Encounters:  05/02/20 173 lb (78.5 kg)  11/19/19 175 lb (79.4 kg)  07/04/19 173 lb (78.5 kg)    VITALS: BP 122/70 (BP Location: Left Arm, Patient Position: Sitting, Cuff Size: Normal)   Pulse 74   Ht 5\' 7"  (1.702 m)    Wt 173 lb (78.5 kg)   BMI 27.10 kg/m   EXAM: General appearance: alert and no distress Neck: no carotid bruit, no JVD and thyroid not enlarged, symmetric, no tenderness/mass/nodules Lungs: clear to auscultation bilaterally Heart: regular rate and rhythm, S1, S2 normal and systolic murmur: Late systolic 3/6, crescendo at 2nd right intercostal space Abdomen: soft, non-tender; bowel sounds normal; no masses,  no organomegaly Extremities: extremities normal, atraumatic, no cyanosis or edema Pulses: 2+ and symmetric Skin: Skin color, texture, turgor normal. No rashes or lesions Neurologic: Grossly normal Psych: Pleasant  EKG: Normal sinus rhythm with sinus arrhythmia at 74, incomplete left bundle branch block, LVH by voltage-personally reviewed  ASSESSMENT: 1. Low normal EF 50 to 55%, inferior hypokinesis, ischemic EKG changes 2. Hypertension-at goal 3. Dyslipidemia-at goal LDL<70 (52) 4. Diabetes type 2-A1c 7.0, followed by Dr. Buddy Duty 5. Severe symptomatic aortic stenosis - mean gradient 34 mmHg, AVA 0.99 cm2 (03/2020) 6. DOE  PLAN: 1.   Mr. Janis was found to have severe aortic stenosis on his echo.  He has a second heart sound but it is soft.  He may have more moderately severe AS.  His valve area was just under 1 cm.  He is however symptomatic with progressive dyspnea on exertion.  He could also have underlying coronary disease.  I would recommend left and right heart catheterization to further address his gradient and evaluate his coronaries.  If he is not quite severe at this point then we could continue to follow it in our multidisciplinary valve clinic but I suspect he will need to have valve replacement in the very near future.  I discussed right and left heart catheterization with him today including the procedure in detail and the risk, benefits and alternatives with him and he is agreeable to proceed and understood those risks. Plan follow-up with me afterwards.  [name], MD, [facility], [place] Director of the Advanced Lipid Disorders &  Cardiovascular Risk Reduction Clinic Diplomate  of the [employer] of Clinical Lipidology Attending Cardiologist  Direct Dial: [phone]  Fax: [phone]  Website:  [name] 05/02/2020, 10:52 AM

## 2020-05-06 ENCOUNTER — Telehealth: Payer: Self-pay | Admitting: *Deleted

## 2020-05-06 ENCOUNTER — Other Ambulatory Visit (HOSPITAL_COMMUNITY)
Admission: RE | Admit: 2020-05-06 | Discharge: 2020-05-06 | Disposition: A | Discharge status: Home / Self Care | Payer: Medicare Other | Source: Ambulatory Visit | Attending: Cardiovascular Disease | Admitting: Cardiovascular Disease

## 2020-05-06 DIAGNOSIS — Z01812 Encounter for preprocedural laboratory examination: Secondary | ICD-10-CM | POA: Diagnosis not present

## 2020-05-06 DIAGNOSIS — Z20822 Contact with and (suspected) exposure to covid-19: Secondary | ICD-10-CM | POA: Diagnosis not present

## 2020-05-06 LAB — SARS CORONAVIRUS 2 (TAT 6-24 HRS): SARS Coronavirus 2: NEGATIVE

## 2020-05-06 NOTE — Telephone Encounter (Signed)
Pt contacted pre-catheterization scheduled at [facility] for:  Thursday May 08, 2020 9:30 AM Verified arrival time and place: [facility]) at: 7:30 AM   No solid food after midnight prior to cath, clear liquids until 5 AM day of procedure.  Hold: Losartan-HCTZ-day before and day of procedure-GFR 48 Voltaren-day before and day of procedure-GFR 48 Metformin-day of procedure and 48 hours post procedure Bydureon weekly on Thursdays-hold AM of procedure-will take the day before or day after procedure.  Except hold medications AM meds can be  taken pre-cath with sips of water including: ASA 81 mg   Confirmed patient has responsible adult to drive home post procedure and be with patient first 24 hours after arriving home:  You are allowed ONE visitor in the waiting room during the time you are at the hospital for your procedure. Both you and your visitor must wear a mask once you enter the hospital.   Reviewed procedure/mask/visitor instructions with patient. GFR 48/Cr 1.51-encouraged patient to drink water/hydrate pre-procedure.

## 2020-05-08 ENCOUNTER — Other Ambulatory Visit: Payer: Self-pay

## 2020-05-08 ENCOUNTER — Encounter: Payer: Self-pay | Admitting: Physician Assistant

## 2020-05-08 ENCOUNTER — Ambulatory Visit (HOSPITAL_COMMUNITY)
Admission: RE | Disposition: A | Discharge status: Home / Self Care | Payer: Self-pay | Source: Home / Self Care | Attending: Cardiovascular Disease

## 2020-05-08 ENCOUNTER — Encounter (HOSPITAL_COMMUNITY): Payer: Self-pay | Admitting: Cardiovascular Disease

## 2020-05-08 ENCOUNTER — Ambulatory Visit (HOSPITAL_COMMUNITY)
Admission: RE | Admit: 2020-05-08 | Discharge: 2020-05-08 | Disposition: A | Discharge status: Home / Self Care | Payer: Medicare Other | Attending: Cardiovascular Disease | Admitting: Cardiovascular Disease

## 2020-05-08 ENCOUNTER — Other Ambulatory Visit: Payer: Self-pay | Admitting: Physician Assistant

## 2020-05-08 DIAGNOSIS — Z7984 Long term (current) use of oral hypoglycemic drugs: Secondary | ICD-10-CM | POA: Diagnosis not present

## 2020-05-08 DIAGNOSIS — E119 Type 2 diabetes mellitus without complications: Secondary | ICD-10-CM | POA: Diagnosis not present

## 2020-05-08 DIAGNOSIS — Z87891 Personal history of nicotine dependence: Secondary | ICD-10-CM | POA: Insufficient documentation

## 2020-05-08 DIAGNOSIS — I350 Nonrheumatic aortic (valve) stenosis: Secondary | ICD-10-CM | POA: Diagnosis not present

## 2020-05-08 DIAGNOSIS — I10 Essential (primary) hypertension: Secondary | ICD-10-CM | POA: Diagnosis not present

## 2020-05-08 DIAGNOSIS — R0609 Other forms of dyspnea: Secondary | ICD-10-CM | POA: Insufficient documentation

## 2020-05-08 DIAGNOSIS — Z7982 Long term (current) use of aspirin: Secondary | ICD-10-CM | POA: Diagnosis not present

## 2020-05-08 DIAGNOSIS — E785 Hyperlipidemia, unspecified: Secondary | ICD-10-CM | POA: Diagnosis not present

## 2020-05-08 DIAGNOSIS — Z79899 Other long term (current) drug therapy: Secondary | ICD-10-CM | POA: Diagnosis not present

## 2020-05-08 DIAGNOSIS — I2510 Atherosclerotic heart disease of native coronary artery without angina pectoris: Secondary | ICD-10-CM | POA: Diagnosis not present

## 2020-05-08 HISTORY — PX: RIGHT/LEFT HEART CATH AND CORONARY ANGIOGRAPHY: CATH118266

## 2020-05-08 LAB — POCT I-STAT EG7
Acid-base deficit: 1 mmol/L (ref 0.0–2.0)
Bicarbonate: 24.5 mmol/L (ref 20.0–28.0)
Calcium, Ion: 1.28 mmol/L (ref 1.15–1.40)
HCT: 38 % — ABNORMAL LOW (ref 39.0–52.0)
Hemoglobin: 12.9 g/dL — ABNORMAL LOW (ref 13.0–17.0)
O2 Saturation: 78 %
Potassium: 4 mmol/L (ref 3.5–5.1)
Sodium: 142 mmol/L (ref 135–145)
TCO2: 26 mmol/L (ref 22–32)
pCO2, Ven: 44.8 mmHg (ref 44.0–60.0)
pH, Ven: 7.346 (ref 7.250–7.430)
pO2, Ven: 45 mmHg (ref 32.0–45.0)

## 2020-05-08 LAB — POCT I-STAT 7, (LYTES, BLD GAS, ICA,H+H)
Acid-base deficit: 1 mmol/L (ref 0.0–2.0)
Bicarbonate: 24.2 mmol/L (ref 20.0–28.0)
Calcium, Ion: 1.32 mmol/L (ref 1.15–1.40)
HCT: 39 % (ref 39.0–52.0)
Hemoglobin: 13.3 g/dL (ref 13.0–17.0)
O2 Saturation: 99 %
Potassium: 4.1 mmol/L (ref 3.5–5.1)
Sodium: 141 mmol/L (ref 135–145)
TCO2: 25 mmol/L (ref 22–32)
pCO2 arterial: 42.4 mmHg (ref 32.0–48.0)
pH, Arterial: 7.364 (ref 7.350–7.450)
pO2, Arterial: 126 mmHg — ABNORMAL HIGH (ref 83.0–108.0)

## 2020-05-08 LAB — GLUCOSE, CAPILLARY: Glucose-Capillary: 167 mg/dL — ABNORMAL HIGH (ref 70–99)

## 2020-05-08 SURGERY — RIGHT/LEFT HEART CATH AND CORONARY ANGIOGRAPHY
Anesthesia: LOCAL

## 2020-05-08 MED ORDER — MIDAZOLAM HCL 2 MG/2ML IJ SOLN
INTRAMUSCULAR | Status: DC | PRN
  Administered 2020-05-08: 2 mg via INTRAVENOUS

## 2020-05-08 MED ORDER — LABETALOL HCL 5 MG/ML IV SOLN: 10.0000 mg | INTRAVENOUS | Status: DC | PRN

## 2020-05-08 MED ORDER — LIDOCAINE HCL (PF) 1 % IJ SOLN
INTRAMUSCULAR | Status: DC | PRN
  Administered 2020-05-08 (×2): 2 mL

## 2020-05-08 MED ORDER — SODIUM CHLORIDE 0.9 % WEIGHT BASED INFUSION
3.0000 mL/kg/h | INTRAVENOUS | Status: AC
  Administered 2020-05-08: 3 mL/kg/h via INTRAVENOUS

## 2020-05-08 MED ORDER — SODIUM CHLORIDE 0.9% FLUSH: 3.0000 mL | Freq: Two times a day (BID) | INTRAVENOUS | Status: DC

## 2020-05-08 MED ORDER — HEPARIN (PORCINE) IN NACL 1000-0.9 UT/500ML-% IV SOLN
INTRAVENOUS | Status: DC | PRN
  Administered 2020-05-08 (×2): 500 mL

## 2020-05-08 MED ORDER — HEPARIN (PORCINE) IN NACL 1000-0.9 UT/500ML-% IV SOLN
INTRAVENOUS | Status: AC
  Filled 2020-05-08: qty 1000

## 2020-05-08 MED ORDER — MIDAZOLAM HCL 2 MG/2ML IJ SOLN
INTRAMUSCULAR | Status: AC
  Filled 2020-05-08: qty 2

## 2020-05-08 MED ORDER — SODIUM CHLORIDE 0.9 % WEIGHT BASED INFUSION: 1.0000 mL/kg/h | INTRAVENOUS | Status: DC

## 2020-05-08 MED ORDER — VERAPAMIL HCL 2.5 MG/ML IV SOLN
INTRAVENOUS | Status: AC
  Filled 2020-05-08: qty 2

## 2020-05-08 MED ORDER — IOHEXOL 350 MG/ML SOLN
INTRAVENOUS | Status: DC | PRN
  Administered 2020-05-08: 45 mL via INTRA_ARTERIAL

## 2020-05-08 MED ORDER — SODIUM CHLORIDE 0.9 % IV SOLN: 250.0000 mL | INTRAVENOUS | Status: DC | PRN

## 2020-05-08 MED ORDER — ONDANSETRON HCL 4 MG/2ML IJ SOLN: 4.0000 mg | Freq: Four times a day (QID) | INTRAMUSCULAR | Status: DC | PRN

## 2020-05-08 MED ORDER — HYDRALAZINE HCL 20 MG/ML IJ SOLN: 10.0000 mg | INTRAMUSCULAR | Status: DC | PRN

## 2020-05-08 MED ORDER — ASPIRIN 81 MG PO CHEW: 81.0000 mg | CHEWABLE_TABLET | ORAL | Status: DC

## 2020-05-08 MED ORDER — SODIUM CHLORIDE 0.9% FLUSH: 3.0000 mL | INTRAVENOUS | Status: DC | PRN

## 2020-05-08 MED ORDER — ACETAMINOPHEN 325 MG PO TABS: 650.0000 mg | ORAL_TABLET | ORAL | Status: DC | PRN

## 2020-05-08 MED ORDER — SODIUM CHLORIDE 0.9 % IV SOLN
250.0000 mL | INTRAVENOUS | Status: DC | PRN
Start: 2020-05-08 — End: 2020-05-08

## 2020-05-08 MED ORDER — FENTANYL CITRATE (PF) 100 MCG/2ML IJ SOLN
INTRAMUSCULAR | Status: DC | PRN
  Administered 2020-05-08: 25 ug via INTRAVENOUS

## 2020-05-08 MED ORDER — HEPARIN SODIUM (PORCINE) 1000 UNIT/ML IJ SOLN
INTRAMUSCULAR | Status: AC
  Filled 2020-05-08: qty 1

## 2020-05-08 MED ORDER — FENTANYL CITRATE (PF) 100 MCG/2ML IJ SOLN
INTRAMUSCULAR | Status: AC
  Filled 2020-05-08: qty 2

## 2020-05-08 MED ORDER — VERAPAMIL HCL 2.5 MG/ML IV SOLN
INTRAVENOUS | Status: DC | PRN
  Administered 2020-05-08: 10 mL via INTRA_ARTERIAL

## 2020-05-08 MED ORDER — LIDOCAINE HCL (PF) 1 % IJ SOLN
INTRAMUSCULAR | Status: AC
  Filled 2020-05-08: qty 30

## 2020-05-08 MED ORDER — HEPARIN SODIUM (PORCINE) 1000 UNIT/ML IJ SOLN
INTRAMUSCULAR | Status: DC | PRN
  Administered 2020-05-08: 4000 [IU] via INTRAVENOUS

## 2020-05-08 SURGICAL SUPPLY — 14 items
CATH 5FR JL3.5 JR4 ANG PIG MP (CATHETERS) ×1 IMPLANT
CATH INFINITI 5FR AL1 (CATHETERS) ×1 IMPLANT
CATH SWAN GANZ 7F STRAIGHT (CATHETERS) ×1 IMPLANT
DEVICE RAD COMP TR BAND LRG (VASCULAR PRODUCTS) ×1 IMPLANT
GLIDESHEATH SLEND SS 6F .021 (SHEATH) ×1 IMPLANT
GLIDESHEATH SLENDER 7FR .021G (SHEATH) ×1 IMPLANT
GUIDEWIRE INQWIRE 1.5J.035X260 (WIRE) IMPLANT
INQWIRE 1.5J .035X260CM (WIRE) ×2
KIT HEART LEFT (KITS) ×2 IMPLANT
PACK CARDIAC CATHETERIZATION (CUSTOM PROCEDURE TRAY) ×2 IMPLANT
TRANSDUCER W/STOPCOCK (MISCELLANEOUS) ×2 IMPLANT
TUBING CIL FLEX 10 FLL-RA (TUBING) ×2 IMPLANT
WIRE EMERALD ST .035X150CM (WIRE) ×1 IMPLANT
WIRE MICROINTRODUCER 60CM (WIRE) ×1 IMPLANT

## 2020-05-08 NOTE — Research (Signed)
PHDEV Informed Consent   Subject Name: Daryl Webb [facility]  Subject met inclusion and exclusion criteria.  The informed consent form, study requirements and expectations were reviewed with the subject and questions and concerns were addressed prior to the signing of the consent form.  The subject verbalized understanding of the trail requirements.  The subject agreed to participate in the [facility] trial and signed the informed consent.  The informed consent was obtained prior to performance of any protocol-specific procedures for the subject.  A copy of the signed informed consent was given to the subject and a copy was placed in the subject's medical record.  [name] D 05/08/2020, [id number]

## 2020-05-08 NOTE — Interval H&P Note (Signed)
History and Physical Interval Note:  05/08/2020 10:04 AM  Daryl Webb  has presented today for surgery, with the diagnosis of aortic stenosis.  The various methods of treatment have been discussed with the patient and family. After consideration of risks, benefits and other options for treatment, the patient has consented to  Procedure(s): RIGHT/LEFT HEART CATH AND CORONARY ANGIOGRAPHY (N/A) as a surgical intervention.  The patient's history has been reviewed, patient examined, no change in status, stable for surgery.  I have reviewed the patient's chart and labs.  Questions were answered to the patient's satisfaction.     [name]

## 2020-05-08 NOTE — Discharge Instructions (Signed)
HOLD METFORMIN FOR 48 HOURS                                                                                                                                                                                                                                      Radial Site Care  This sheet gives you information about how to care for yourself after your procedure. Your health care provider may also give you more specific instructions. If you have problems or questions, contact your health care provider. What can I expect after the procedure? After the procedure, it is common to have:  Bruising and tenderness at the catheter insertion area. Follow these instructions at home: Medicines  Take over-the-counter and prescription medicines only as told by your health care provider. Insertion site care  Follow instructions from your health care provider about how to take care of your insertion site. Make sure you: ? Wash your hands with soap and water before you change your bandage (dressing). If soap and water are not available, use hand sanitizer. ? Change your dressing as told by your health care provider. ? Leave stitches (sutures), skin glue, or adhesive strips in place. These skin closures may need to stay in place for 2 weeks or longer. If adhesive strip edges start to loosen and curl up, you may trim the loose edges. Do not remove adhesive strips completely unless your health care provider tells you to do that.  Check your insertion site every day for signs of infection. Check for: ? Redness, swelling, or pain. ? Fluid or blood. ? Pus or a bad smell. ? Warmth.  Do not take baths, swim, or use a hot tub until your health care provider approves.  You may shower 24-48 hours after the procedure, or as directed by your health care provider. ? Remove the dressing and gently wash the site with plain soap and water. ? Pat the area dry with a clean towel. ? Do not rub the site. That could cause  bleeding.  Do not apply powder or lotion to the site. Activity  For 24 hours after the procedure, or as directed by your health care provider: ? Do not flex or bend the affected arm. ? Do not push or pull heavy objects with the affected arm. ? Do not drive yourself home from the hospital or clinic. You may drive 24 hours after the procedure unless your health  care provider tells you not to. ? Do not operate machinery or power tools.  Do not lift anything that is heavier than 10 lb (4.5 kg), or the limit that you are told, until your health care provider says that it is safe.  Ask your health care provider when it is okay to: ? Return to work or school. ? Resume usual physical activities or sports. ? Resume sexual activity.   General instructions  If the catheter site starts to bleed, raise your arm and put firm pressure on the site. If the bleeding does not stop, get help right away. This is a medical emergency.  If you went home on the same day as your procedure, a responsible adult should be with you for the first 24 hours after you arrive home.  Keep all follow-up visits as told by your health care provider. This is important. Contact a health care provider if:  You have a fever.  You have redness, swelling, or yellow drainage around your insertion site. Get help right away if:  You have unusual pain at the radial site.  The catheter insertion area swells very fast.  The insertion area is bleeding, and the bleeding does not stop when you hold steady pressure on the area.  Your arm or hand becomes pale, cool, tingly, or numb. These symptoms may represent a serious problem that is an emergency. Do not wait to see if the symptoms will go away. Get medical help right away. Call your local emergency services (911 in the [place] Do not drive yourself to the hospital. Summary  After the procedure, it is common to have bruising and tenderness at the site.  Follow instructions  from your health care provider about how to take care of your radial site wound. Check the wound every day for signs of infection.  Do not lift anything that is heavier than 10 lb (4.5 kg), or the limit that you are told, until your health care provider says that it is safe. This information is not intended to replace advice given to you by your health care provider. Make sure you discuss any questions you have with your health care provider. Document Revised: 03/02/2017 Document Reviewed: 03/02/2017 Elsevier Patient Education  2021 [employer].

## 2020-05-09 MED FILL — Verapamil HCl IV Soln 2.5 MG/ML: INTRAVENOUS | Qty: 2 | Status: AC

## 2020-05-14 ENCOUNTER — Ambulatory Visit (HOSPITAL_COMMUNITY)
Admission: RE | Admit: 2020-05-14 | Discharge: 2020-05-14 | Disposition: A | Discharge status: Home / Self Care | Payer: Medicare Other | Attending: Physician Assistant | Admitting: Physician Assistant

## 2020-05-14 ENCOUNTER — Ambulatory Visit (HOSPITAL_COMMUNITY)
Admission: RE | Admit: 2020-05-14 | Discharge: 2020-05-14 | Disposition: A | Discharge status: Home / Self Care | Payer: Medicare Other | Source: Home / Self Care | Attending: Physician Assistant | Admitting: Physician Assistant

## 2020-05-14 ENCOUNTER — Other Ambulatory Visit: Payer: Self-pay

## 2020-05-14 DIAGNOSIS — N289 Disorder of kidney and ureter, unspecified: Secondary | ICD-10-CM | POA: Diagnosis not present

## 2020-05-14 DIAGNOSIS — I350 Nonrheumatic aortic (valve) stenosis: Secondary | ICD-10-CM | POA: Diagnosis not present

## 2020-05-14 DIAGNOSIS — I2510 Atherosclerotic heart disease of native coronary artery without angina pectoris: Secondary | ICD-10-CM | POA: Diagnosis not present

## 2020-05-14 DIAGNOSIS — Z01818 Encounter for other preprocedural examination: Secondary | ICD-10-CM | POA: Diagnosis not present

## 2020-05-14 DIAGNOSIS — I70203 Unspecified atherosclerosis of native arteries of extremities, bilateral legs: Secondary | ICD-10-CM | POA: Diagnosis not present

## 2020-05-14 MED ORDER — IOHEXOL 350 MG/ML SOLN
80.0000 mL | Freq: Once | INTRAVENOUS | Status: AC | PRN
  Administered 2020-05-14: 80 mL via INTRAVENOUS

## 2020-05-14 NOTE — Progress Notes (Signed)
Carotid US completed    Please see CV Proc for preliminary results.   Vonzell Schlatter, RVT

## 2020-05-15 NOTE — Progress Notes (Signed)
[facility]   MULTIDISCIPLINARY HEART VALVE TEAM  Date:  05/16/2020   ID:  Daryl Webb, DOB May 21, 1945, MRN [id number]  PCP:  [name], [name], MD   Chief Complaint  Patient presents with  . Shortness of Breath     HISTORY OF PRESENT ILLNESS: Daryl Webb is a 75 y.o. male who presents for evaluation of aortic stenosis, referred by Dr Debara Pickett.  The patient has been followed for hypertension, hyperlipidemia, type 2 diabetes, and aortic stenosis.  He was previously followed by Dr. Mare Ferrari, and over the last 4 years has been followed by [name].  Annual surveillance echo studies have demonstrated findings consistent with moderate aortic stenosis, until this years echo which showed some reduction in LV systolic function with an LVEF of 50 to 55%, and progression of aortic stenosis with a mean gradient of 34 mmHg, peak gradient 64 mmHg, calculated aortic valve area of 0.99 cm, and LVOT/aortic valve VTI ratio of 0.26.  The patient is here with his wife today. He has known of a heart murmur for many years, dating back to his care with Dr Mare Ferrari. He has done well over the years. The patient states that he has led a sedentary lifestyle over the past 2-3 years. 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 He is short of breath with bending forward or walking a flight of stairs. He denies chest pain, chest pressure, orthopnea, or PND. He has some dizziness when first getting up.   He has had knee replacement, radical prostatectomy, and laminectomy in the past.   The patient has had regular dental care and reports no problems. He recently had a tooth extracted.   Past Medical History:  Diagnosis Date  . Anemia   . Arthritis   . Constipation due to pain medication    needs stool softner while on pain medication  . Diabetes mellitus   .  Duodenal ulcer 08/13/2015   2014  - Dr Kaplan/GI  . H/O: osteoarthritis   . History of prostate cancer   . History of urethral stricture   . Hypercholesterolemia   . Hypertension   . Ulcer duodenal hemorrhage     Current Outpatient Medications  Medication Sig Dispense Refill  . ACCU-CHEK SMARTVIEW test strip Use to test blood sugars once daily    . amLODipine (NORVASC) 5 MG tablet TAKE 1 TABLET DAILY (Patient taking differently: Take 5 mg by mouth daily.) 90 tablet 1  . amoxicillin (AMOXIL) 500 MG capsule Take 2,000 mg by mouth See admin instructions. Dental procedures  2  . aspirin 81 MG tablet Take 81 mg by mouth daily.    Marland Kitchen atorvastatin (LIPITOR) 40 MG tablet TAKE 1 TABLET DAILY (Patient taking differently: 40 mg daily.) 90 tablet 1  . BYDUREON BCISE 2 MG/0.85ML AUIJ Inject 2 mg into the skin once a week. Thursday    . Cholecalciferol 2000 UNITS CAPS Take 2,000 Units by mouth 2 (two) times a week. VITAMIN D 3    . Coenzyme Q10 (COQ10) 100 MG CAPS Take 100 mg by mouth 2 (two) times a week.    . cyanocobalamin (,VITAMIN B-12,) 1000 MCG/ML injection Inject 1,000 mcg into the muscle every 30 (thirty) days.    . diclofenac (VOLTAREN) 75 MG EC tablet TAKE 1 TABLET DAILY AS     NEEDED (  Patient taking differently: Take 75 mg by mouth daily.) 90 tablet 2  . loratadine (CLARITIN) 10 MG tablet Take 5 mg by mouth daily as needed for allergies.    Marland Kitchen losartan-hydrochlorothiazide (HYZAAR) 50-12.5 MG tablet TAKE 1 TABLET DAILY (Patient taking differently: Take 1 tablet by mouth daily.) 90 tablet 3  . metFORMIN (GLUCOPHAGE) 500 MG tablet Take 500 mg by mouth 2 (two) times daily.    . metoprolol succinate (TOPROL-XL) 50 MG 24 hr tablet TAKE 1 TABLET DAILY (Patient taking differently: Take 50 mg by mouth daily.) 90 tablet 2  . Omega-3 Fatty Acids (FISH OIL) 1000 MG CAPS Take 1,000 mg by mouth 2 (two) times a week.     No current facility-administered medications for this visit.    ALLERGIES:   Patient  has no known allergies.   SOCIAL HISTORY:  The patient  reports that he quit smoking about 40 years ago. His smoking use included cigarettes. He has a 30.00 pack-year smoking history. He has never used smokeless tobacco. He reports that he does not drink alcohol and does not use drugs.   FAMILY HISTORY:  The patient's family history includes Diabetes in his father; Heart disease in his father and mother; Heart failure in his mother.   REVIEW OF SYSTEMS:  Positive for memory loss, fatigue.   All other systems are reviewed and negative.   PHYSICAL EXAM: VS:  BP 128/70   Pulse 76   Ht 5\' 7"  (1.702 m)   Wt 173 lb (78.5 kg)   SpO2 96%   BMI 27.10 kg/m  , BMI Body mass index is 27.1 kg/m. GEN: Well nourished, well developed, in no acute distress HEENT: normal Neck: No JVD. carotids 2+ without bruits or masses Cardiac: The heart is RRR with a 3/6 harsh crescendo decrescendo murmur at the RUSB, mid-peaking, A2 audible. No edema. Pedal pulses 2+ = bilaterally  Respiratory:  clear to auscultation bilaterally GI: soft, nontender, nondistended, + BS MS: no deformity or atrophy Skin: warm and dry, no rash Neuro:  Strength and sensation are intact Psych: euthymic mood, full affect  EKG:  EKG from 05/02/2020 reviewed and demonstrates NSR, LAD, LVH  RECENT LABS: 06/05/2019: ALT 18 05/02/2020: BUN 25; Creatinine, Ser 1.51; Platelets 204 05/08/2020: Hemoglobin 12.9; Potassium 4.0; Sodium 142  No results found for requested labs within last 8760 hours.   Estimated Creatinine Clearance: 39.5 mL/min (A) (by C-G formula based on SCr of 1.51 mg/dL (H)).   Wt Readings from Last 3 Encounters:  05/16/20 173 lb (78.5 kg)  05/08/20 (P) 170 lb (77.1 kg)  05/02/20 173 lb (78.5 kg)     CARDIAC STUDIES:  Echo:  IMPRESSIONS    1. Left ventricular ejection fraction, by estimation, is 50 to 55%. The  left ventricle has low normal function. The left ventricle demonstrates  regional wall motion  abnormalities (see scoring diagram/findings for  description). There is moderate  concentric left ventricular hypertrophy. Left ventricular diastolic  parameters are consistent with Grade II diastolic dysfunction  (pseudonormalization). Elevated left ventricular end-diastolic pressure.  There is moderate hypokinesis of the left  ventricular, basal inferoseptal wall.  2. Right ventricular systolic function is normal. The right ventricular  size is normal.  3. Left atrial size was mild to moderately dilated.  4. The mitral valve is normal in structure. Trivial mitral valve  regurgitation. No evidence of mitral stenosis.  5. The aortic valve is calcified. There is severe calcifcation of the  aortic valve. There is moderate  thickening of the aortic valve. Aortic  valve regurgitation is mild. Severe aortic valve stenosis. Aortic valve  area, by VTI measures 0.99 cm. Aortic  valve mean gradient measures 34.0 mmHg. Aortic valve Vmax measures 4.00  m/s.   Comparison(s): Changes from prior study are noted.   Conclusion(s)/Recommendation(s): Since last study, AS is severe. Peak  velocity is 4 m/s, AVA is 0.99.   FINDINGS  Left Ventricle: Left ventricular ejection fraction, by estimation, is 50  to 55%. The left ventricle has low normal function. The left ventricle  demonstrates regional wall motion abnormalities. Moderate hypokinesis of  the left ventricular, basal  inferoseptal wall. The left ventricular internal cavity size was normal in  size. There is moderate concentric left ventricular hypertrophy. Left  ventricular diastolic parameters are consistent with Grade II diastolic  dysfunction (pseudonormalization).  Elevated left ventricular end-diastolic pressure.   Right Ventricle: The right ventricular size is normal. No increase in  right ventricular wall thickness. Right ventricular systolic function is  normal.   Left Atrium: Left atrial size was mild to moderately dilated.    Right Atrium: Right atrial size was normal in size.   Pericardium: There is no evidence of pericardial effusion.   Mitral Valve: The mitral valve is normal in structure. Mild mitral annular  calcification. Trivial mitral valve regurgitation. No evidence of mitral  valve stenosis.   Tricuspid Valve: The tricuspid valve is normal in structure. Tricuspid  valve regurgitation is trivial. No evidence of tricuspid stenosis.   Aortic Valve: The aortic valve is calcified. There is severe calcifcation  of the aortic valve. There is moderate thickening of the aortic valve.  There is moderate aortic valve annular calcification. Aortic valve  regurgitation is mild. Aortic  regurgitation PHT measures 751 msec. Severe aortic stenosis is present.  Aortic valve mean gradient measures 34.0 mmHg. Aortic valve peak gradient  measures 64.0 mmHg. Aortic valve area, by VTI measures 0.99 cm.   Pulmonic Valve: The pulmonic valve was grossly normal. Pulmonic valve  regurgitation is not visualized.   Aorta: The aortic root and ascending aorta are structurally normal, with  no evidence of dilitation and the aortic arch was not well visualized.   Venous: The inferior vena cava was not well visualized.   IAS/Shunts: The atrial septum is grossly normal.     LEFT VENTRICLE  PLAX 2D  LVIDd:     5.00 cm Diastology  LVIDs:     3.50 cm LV e' medial:  4.35 cm/s  LV PW:     1.20 cm LV E/e' medial: 20.7  LV IVS:    1.40 cm LV e' lateral:  5.03 cm/s  LVOT diam:   2.20 cm LV E/e' lateral: 17.9  LV SV:     81  LV SV Index:  43  LVOT Area:   3.80 cm     RIGHT VENTRICLE  RV Basal diam: 1.30 cm  RV S prime:   16.70 cm/s  TAPSE (M-mode): 2.8 cm   LEFT ATRIUM       Index    RIGHT ATRIUM     Index  LA diam:    3.80 cm 1.99 cm/m RA Area:   9.68 cm  LA Vol (A2C):  54.9 ml 28.73 ml/m RA Volume:  15.00 ml 7.85 ml/m  LA Vol (A4C):  61.7 ml 32.29  ml/m  LA Biplane Vol: 62.7 ml 32.82 ml/m  AORTIC VALVE  AV Area (Vmax):  0.81 cm  AV Area (Vmean):  0.78 cm  AV Area (VTI):   0.99 cm  AV Vmax:      400.00 cm/s  AV Vmean:     266.000 cm/s  AV VTI:      0.819 m  AV Peak Grad:   64.0 mmHg  AV Mean Grad:   34.0 mmHg  LVOT Vmax:     85.50 cm/s  LVOT Vmean:    54.900 cm/s  LVOT VTI:     0.214 m  LVOT/AV VTI ratio: 0.26  AI PHT:      751 msec    AORTA  Ao Root diam: 3.70 cm   MITRAL VALVE  MV Area (PHT): 3.47 cm  SHUNTS  MV Decel Time: 219 msec  Systemic VTI: 0.21 m  MV E velocity: 89.83 cm/s Systemic Diam: 2.20 cm  MV A velocity: 92.27 cm/s  MV E/A ratio: 0.97    Cardiac Cath:  Conclusion  1.  Widely patent left main 2.  Widely patent LAD with ostial stenosis of a small second diagonal branch, otherwise no significant obstruction in the LAD territory 3.  Patent left circumflex with mild to moderate nonobstructive plaquing in the mid vessel 4.  Patent RCA with mild nonobstructive stenosis, except for a 70% PDA stenosis in a relatively small caliber PDA branch 5.  Calcific aortic valve with restricted leaflet mobility on plain fluoroscopy.  Peak to peak transaortic gradient 43 mmHg, mean gradient 36 mmHg, calculated aortic valve area 1.17 cm  Recommendations: Patient with exertional symptoms, progressive aortic stenosis in the moderately severe range.  Will review with multidisciplinary heart team, arrange structural heart consultation, arrange CT angiography studies, and consider options of continued close surveillance versus moving forward with aortic valve replacement.  Recommend medical therapy for coronary artery disease.  Diagnostic Dominance: Right  Left Main  The vessel exhibits minimal luminal irregularities.  Left Anterior Descending  There is mild diffuse disease throughout the vessel.  Left Circumflex  Mid Cx to Dist Cx lesion is 50% stenosed.  First  Obtuse Marginal Branch  1st Mrg lesion is 30% stenosed. Large high OM branch, supplies a large territory  Right Coronary Artery  Prox RCA lesion is 40% stenosed.  Mid RCA lesion is 30% stenosed.  Right Posterior Descending Artery  RPDA lesion is 70% stenosed.    Diagnostic Dominance: Right    Hemo Data  Flowsheet Row Most Recent Value  Fick Cardiac Output 6.46 L/min  Fick Cardiac Output Index 3.42 (L/min)/BSA  Aortic Mean Gradient 36.2 mmHg  Aortic Peak Gradient 43 mmHg  Aortic Valve Area 1.17  Aortic Value Area Index 0.62 cm2/BSA  RA A Wave 6 mmHg  RA V Wave 3 mmHg  RA Mean 3 mmHg  RV Systolic Pressure 34 mmHg  RV Diastolic Pressure 0 mmHg  RV EDP 4 mmHg  PA Systolic Pressure 28 mmHg  PA Diastolic Pressure 10 mmHg  PA Mean 19 mmHg  PW A Wave 17 mmHg  PW V Wave 16 mmHg  PW Mean 13 mmHg  AO Systolic Pressure 314 mmHg  AO Diastolic Pressure 66 mmHg  AO Mean 92 mmHg  LV Systolic Pressure 970 mmHg  LV Diastolic Pressure 0 mmHg  LV EDP 15 mmHg  AOp Systolic Pressure 263 mmHg  AOp Diastolic Pressure 67 mmHg  AOp Mean Pressure 94 mmHg  LVp Systolic Pressure 785 mmHg  LVp Diastolic Pressure 0 mmHg  LVp EDP Pressure 16 mmHg  QP/QS 1  TPVR Index 5.55 HRUI  TSVR Index 26.86 HRUI  PVR SVR Ratio 0.07  TPVR/TSVR Ratio 0.21   CTA Heart: FINDINGS: Aortic Root:  Aortic valve: Trileaflet  Aortic valve calcium score: Calcium score 3435  Aortic annulus:  Diameter: 61mm x 32mm  Perimeter: 47mm  Area: 654 mm^2  Calcifications: No calcifications  Coronary height: Min Left - 14mm, Max Left - 75mm; Min Right - 40mm  Sinotubular height: Left cusp - 45mm; Right cusp - 12mm; Noncoronary cusp - 44mm  LVOT (as measured 3 mm below the annulus):  Diameter: 53mm x 19mm  Area: 694 mm^2  Calcifications: No calcifications  Aortic sinus width: Left cusp - 40mm; Right cusp - 68mm; Noncoronary cusp - 70mm  Sinotubular junction width: 49mm x  62mm  Optimum Fluoroscopic Angle for Delivery: LAO 2 CAU 8  Cardiac:  Right atrium: Normal size  Right ventricle: Normal size  Pulmonary arteries: Normal size  Pulmonary veins: Normal configuration  Left atrium: Mild enlargement  Left ventricle: Mild dilatation  Pericardium: Normal thickness  Coronary arteries: Coronary calcium score 807 (74th percentile)  IMPRESSION: 1. Trileaflet aortic valve with severe calcifications (AV calcium score 3435)  2. Aortic annulus measures 37mm x 47mm in diameter with perimeter 72mm and area 654 mm^2. No annular or LVOT calcifications. Annular measurements suitable for delivery of 66mm Edwards-Sapien 3 valve.  2. Sufficient coronary to annulus distance, measuring 54mm to left main and 65mm to RCA  3. Optimum Fluoroscopic Angle for Delivery: LAO 2 CAU 8  4. Coronary calcium score 807 (74th percentile)   CTA Chest/Abd/Pelvis: AORTA:  Minimal Aortic Diameter-15 x 14 mm  Severity of Aortic Calcification-moderate to severe  RIGHT PELVIS:  Right Common Iliac Artery -  Minimal Diameter-7.3 x 8.0 mm  Tortuosity-mild  Calcification-moderate to severe  Right External Iliac Artery -  Minimal Diameter-8.5 x 8.7 mm  Tortuosity-severe  Calcification-mild  Right Common Femoral Artery -  Minimal Diameter-8.9 x 9.4 mm  Tortuosity-mild  Calcification-mild  LEFT PELVIS:  Left Common Iliac Artery -  Minimal Diameter-9.5 x 8.2 mm  Tortuosity-mild  Calcification-moderate  Left External Iliac Artery -  Minimal Diameter-8.3 x 8.0 mm  Tortuosity-moderate  Calcification-mild  Left Common Femoral Artery -  Minimal Diameter-9.0 x 6.8 mm  Tortuosity-mild  Calcification-mild  Review of the MIP images confirms the above findings.  IMPRESSION: 1. Vascular findings and measurements to potential TAVR procedure, as detailed above. 2. Severe thickening calcification of the  aortic valve, compatible with reported clinical history of severe aortic stenosis. 3. Mild cardiomegaly. 4. Aortic atherosclerosis, in addition to left main and 3 vessel coronary artery disease. Assessment for potential risk factor modification, dietary therapy or pharmacologic therapy may be warranted, if clinically indicated. 5. Indeterminate lesion in the interpolar region of the right kidney which has grown compared to prior study from 2017. Further characterization with nonemergent abdominal MRI with and without IV gadolinium is recommended in the near future to exclude potential neoplasm. 6. Additional incidental findings, as above  STS RISK CALCULATOR: AVR/CABG: Risk of Mortality: 1.564% Renal Failure: 2.267% Permanent Stroke: 1.547% Prolonged Ventilation: 6.419% DSW Infection: 0.150% Reoperation: 3.960% Morbidity or Mortality: 11.381% Short Length of Stay: 40.788% Long Length of Stay: 5.231%  ASSESSMENT AND PLAN: 76 year old gentleman with severe, stage D1, aortic stenosis.  This is associated with New York Heart Association functional class II symptoms of exertional dyspnea.  The patient has developed mildly reduced LV systolic function in the setting of aortic stenosis.  His echo, cardiac catheterization, and CTA studies were all reviewed today.  On echo, LVEF is 50 to 55%.  Peak and mean transaortic gradients are 64 and 34 mmHg, respectively.  Calculated aortic valve area 0.99 cm.  Dimensionless index of 0.25.  Cardiac catheterization reveals a calcified aortic valve with restricted leaflet mobility on plain fluoroscopy, peak and mean transvalvular gradients of 43 and 34 mmHg, respectively.  CTA studies are pertinent for a large aortic valve annulus of 654 mm.  The aortic valve leaflets are severely calcified with an aortic valve calcium score greater than 3000.  In summary, multimodality imaging data supports a diagnosis of severe aortic stenosis in this patient with  slowly progressive symptoms of shortness of breath with activity.  I have reviewed the natural history of aortic stenosis with the patient and their family members who are present today. We have discussed the limitations of medical therapy and the poor prognosis associated with symptomatic aortic stenosis. We have reviewed potential treatment options, including palliative medical therapy, conventional surgical aortic valve replacement, and transcatheter aortic valve replacement. We discussed treatment options in the context of the patient's specific comorbid medical conditions.  Comorbid medical conditions include type 2 diabetes, hypertension, sedentary lifestyle, and mild memory impairment (reported by the patient).  TAVR seems like a reasonable treatment option in this 75 year old patient.  He will be referred for cardiac surgical evaluation as part of a multidisciplinary approach to his care.  As part of his evaluation today, we will reviewed all pertinent steps to the TAVR procedure and a procedural animation is demonstrated.  We also discussed potential risks, including vascular injury, stroke, myocardial infarction, device embolization, arrhythmia, aortic dissection or annular rupture, cardiac perforation, cardiac tamponade, emergency cardiac surgery, and death.  Patient and his wife understand these risks occur at low frequency of approximately 1%.  We discussed the potential risk for high-grade AV block and permanent pacing.  We discussed issues around valve longevity comparing TAVR bioprosthesis and surgical bioprosthesis.  All of their questions are answered.  Further plans for treatment pending cardiac surgical evaluation.  In addition to the discussion above, we reviewed CTA findings and need for follow-up abdominal MRI with and without gadolinium contrast for further evaluation of a renal lesion.  The patient follows with Dr. Diona Fanti and if this requires further evaluation, he will be referred to  him.   Deatra James 05/16/2020 11:27 AM     [facility] [address] [place] [address]  [phone] (office) [phone] (fax)

## 2020-05-16 ENCOUNTER — Other Ambulatory Visit: Payer: Self-pay

## 2020-05-16 ENCOUNTER — Encounter: Payer: Self-pay | Admitting: Cardiovascular Disease

## 2020-05-16 ENCOUNTER — Ambulatory Visit (INDEPENDENT_AMBULATORY_CARE_PROVIDER_SITE_OTHER): Payer: Medicare Other | Admitting: Cardiovascular Disease

## 2020-05-16 VITALS — BP 128/70 | HR 76 | Ht 67.0 in | Wt 173.0 lb

## 2020-05-16 DIAGNOSIS — I350 Nonrheumatic aortic (valve) stenosis: Secondary | ICD-10-CM

## 2020-05-16 NOTE — Patient Instructions (Signed)
[name], [facility] Nurse Navigator, will call you to arrange appointments.

## 2020-05-19 ENCOUNTER — Ambulatory Visit (INDEPENDENT_AMBULATORY_CARE_PROVIDER_SITE_OTHER): Payer: Medicare Other | Admitting: Family Medicine

## 2020-05-19 ENCOUNTER — Encounter: Payer: Self-pay | Admitting: Family Medicine

## 2020-05-19 ENCOUNTER — Other Ambulatory Visit: Payer: Self-pay

## 2020-05-19 VITALS — BP 120/70 | HR 90 | Resp 16 | Ht 67.0 in | Wt 173.0 lb

## 2020-05-19 DIAGNOSIS — M159 Polyosteoarthritis, unspecified: Secondary | ICD-10-CM

## 2020-05-19 DIAGNOSIS — E782 Mixed hyperlipidemia: Secondary | ICD-10-CM

## 2020-05-19 DIAGNOSIS — I700 Atherosclerosis of aorta: Secondary | ICD-10-CM | POA: Diagnosis not present

## 2020-05-19 DIAGNOSIS — I10 Essential (primary) hypertension: Secondary | ICD-10-CM | POA: Diagnosis not present

## 2020-05-19 DIAGNOSIS — N1832 Chronic kidney disease, stage 3b: Secondary | ICD-10-CM | POA: Diagnosis not present

## 2020-05-19 NOTE — Patient Instructions (Addendum)
A few things to remember from today's visit:   Generalized osteoarthritis of multiple sites  Essential hypertension  Stage 3b chronic kidney disease ([place])  Mixed hyperlipidemia - Plan: Lipid panel  If you need refills please call your pharmacy. Do not use My Chart to request refills or for acute issues that need immediate attention.   Cholesterol can be checked with next blood work in 08/2020.  No changes in medications today. We will continue monitoring your kidney function regularly to be sure it is stable.  Please be sure medication list is accurate. If a new problem present, please set up appointment sooner than planned today.

## 2020-05-19 NOTE — Progress Notes (Signed)
HPI: Daryl Webb is a 75 y.o. male, who is here today for 6 months follow up.   He was last seen on 11/19/19 Since his last visit he has seen his cardiologist. Underwent right and left cardiac cath on 05/08/20:  1.  Widely patent left main 2.  Widely patent LAD with ostial stenosis of a small second diagonal branch, otherwise no significant obstruction in the LAD territory 3.  Patent left circumflex with mild to moderate nonobstructive plaquing in the mid vessel 4.  Patent RCA with mild nonobstructive stenosis, except for a 70% PDA stenosis in a relatively small caliber PDA branch 5.  Calcific aortic valve with restricted leaflet mobility on plain fluoroscopy.  Peak to peak transaortic gradient 43 mmHg, mean gradient 36 mmHg, calculated aortic valve area 1.17 cm  Aortic stenosis is getting worse. He has noted that he is mildly short of breath with yard work, not "as vigorous" as he used to be.His wife has noted DOE, which he attributed to more sedentary life style for the past 2 years due to [place] pandemia.  He is planning on undergoing TAVR around 08/2020.  Echo 03/26/20: LVEF 50-55% and grade II diastolic dysfunction. Negative for orthopnea and PND.  HTN on Losartan-HCTZ 50-12.5 mg daily,Metoprolol succinate 50 mg daily,and Amlodipine 5 mg daily.  Negative severe/frequent headache, visual changes, chest pain, palpitation, claudication, focal weakness, or edema. CKD III: He has not noted gross hematuria, foam in urine,or decreased urine output. DM II following with Dr Buddy Duty, last [facility] 12/21/19 was 7.2.  Lab Results  Component Value Date   CREATININE 1.51 (H) 05/02/2020   BUN 25 05/02/2020   NA 142 05/08/2020   K 4.0 05/08/2020   CL 103 05/02/2020   CO2 22 05/02/2020   Estimated Creatinine Clearance: 39.5 mL/min (A) (by C-G formula based on SCr of 1.51 mg/dL (H)).  He is on chronic NSAID's treatment. Generalized OA, he takes Diclofenac 75 mg daily as  needed. He tried other medications, Tramadol and Tylenol, which did not help with pain as Diclofenac does. He understands side effects of NSAID's.  HLD: He is on Atorvastatin 40 mg daily. Last FLP done in 05/2019 at his endocrinologist's office. TC 103,TG 132,HDL 28,and LDL 52. Aortic atherosclerosis seen on imaging. He is on Aspirin 81 mg daily.  Review of Systems  Constitutional: Negative for activity change, appetite change, fatigue and fever.  HENT: Negative for mouth sores, nosebleeds and sore throat.   Respiratory: Negative for cough and wheezing.   Gastrointestinal: Negative for abdominal pain, nausea and vomiting.  Musculoskeletal: Positive for arthralgias. Negative for gait problem.  Neurological: Negative for syncope, facial asymmetry, weakness and headaches.  Rest of ROS, see pertinent positives sand negatives in HPI  Current Outpatient Medications on File Prior to Visit  Medication Sig Dispense Refill  . ACCU-CHEK SMARTVIEW test strip Use to test blood sugars once daily    . amLODipine (NORVASC) 5 MG tablet TAKE 1 TABLET DAILY (Patient taking differently: Take 5 mg by mouth daily.) 90 tablet 1  . amoxicillin (AMOXIL) 500 MG capsule Take 2,000 mg by mouth See admin instructions. Dental procedures  2  . aspirin 81 MG tablet Take 81 mg by mouth daily.    Marland Kitchen atorvastatin (LIPITOR) 40 MG tablet TAKE 1 TABLET DAILY (Patient taking differently: 40 mg daily.) 90 tablet 1  . BYDUREON BCISE 2 MG/0.85ML AUIJ Inject 2 mg into the skin once a week. Thursday    . Cholecalciferol 2000  UNITS CAPS Take 2,000 Units by mouth 2 (two) times a week. VITAMIN D 3    . Coenzyme Q10 (COQ10) 100 MG CAPS Take 100 mg by mouth 2 (two) times a week.    . cyanocobalamin (,VITAMIN B-12,) 1000 MCG/ML injection Inject 1,000 mcg into the muscle every 30 (thirty) days.    . diclofenac (VOLTAREN) 75 MG EC tablet TAKE 1 TABLET DAILY AS     NEEDED (Patient taking differently: Take 75 mg by mouth daily.) 90 tablet 2   . loratadine (CLARITIN) 10 MG tablet Take 5 mg by mouth daily as needed for allergies.    Marland Kitchen losartan-hydrochlorothiazide (HYZAAR) 50-12.5 MG tablet TAKE 1 TABLET DAILY (Patient taking differently: Take 1 tablet by mouth daily.) 90 tablet 3  . metFORMIN (GLUCOPHAGE) 500 MG tablet Take 500 mg by mouth 2 (two) times daily.    . metoprolol succinate (TOPROL-XL) 50 MG 24 hr tablet TAKE 1 TABLET DAILY (Patient taking differently: Take 50 mg by mouth daily.) 90 tablet 2  . Omega-3 Fatty Acids (FISH OIL) 1000 MG CAPS Take 1,000 mg by mouth 2 (two) times a week.     No current facility-administered medications on file prior to visit.     Past Medical History:  Diagnosis Date  . Anemia   . Arthritis   . Constipation due to pain medication    needs stool softner while on pain medication  . Diabetes mellitus   . Duodenal ulcer 08/13/2015   2014  - Dr Kaplan/GI  . H/O: osteoarthritis   . History of prostate cancer   . History of urethral stricture   . Hypercholesterolemia   . Hypertension   . Ulcer duodenal hemorrhage    No Known Allergies  Social History   Socioeconomic History  . Marital status: Married    Spouse name: Not on file  . Number of children: 2  . Years of education: Not on file  . Highest education level: Bachelor's degree (e.g., BA, AB, BS)  Occupational History  . Occupation: Retired  Tobacco Use  . Smoking status: Former Smoker    Packs/day: 1.50    Years: 20.00    Pack years: 30.00    Types: Cigarettes    Quit date: 02/09/1980    Years since quitting: 40.3  . Smokeless tobacco: Never Used  Vaping Use  . Vaping Use: Never used  Substance and Sexual Activity  . Alcohol use: No  . Drug use: No  . Sexual activity: Not Currently  Other Topics Concern  . 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    Lives with wife,    two children in [place].    Enjoys reading, boating, travelling to [place] in the winter with wife.    Social Determinants of Health   Financial Resource Strain: Not on file  Food Insecurity: Not on file  Transportation Needs: No Transportation Needs  . Lack of Transportation (Medical): No  . Lack of Transportation (Non-Medical): No  Physical Activity: Inactive  . Days of Exercise per Week: 0 days  . Minutes of Exercise per Session: 0 min  Stress: Not on file  Social Connections: Not on file    Vitals:   05/19/20 0947  BP: 120/70  Pulse: 90  Resp: 16  SpO2: 98%   Body mass index is 27.1 kg/m.  Physical Exam Vitals and nursing note reviewed.  Constitutional:      General: He is not in acute distress.  Appearance: He is well-developed.  HENT:     Head: Normocephalic and atraumatic.     Mouth/Throat:     Mouth: Mucous membranes are moist.     Pharynx: Oropharynx is clear.  Eyes:     Conjunctiva/sclera: Conjunctivae normal.  Cardiovascular:     Rate and Rhythm: Normal rate and regular rhythm.     Pulses:          Dorsalis pedis pulses are 2+ on the right side and 2+ on the left side.     Heart sounds: Murmur (SEM III/VI RUSB) heard.      Comments: Trace pitting LE edema,bilateral. Pulmonary:     Effort: Pulmonary effort is normal. No respiratory distress.     Breath sounds: Normal breath sounds.  Abdominal:     Palpations: Abdomen is soft. There is no hepatomegaly or mass.     Tenderness: There is no abdominal tenderness.  Musculoskeletal:     Right lower leg: Pitting Edema present.     Left lower leg: Pitting Edema present.  Lymphadenopathy:     Cervical: No cervical adenopathy.  Skin:    General: Skin is warm.     Findings: No erythema or rash.  Neurological:     Mental Status: He is alert and oriented to person, place, and time.     Cranial Nerves: No cranial nerve deficit.     Comments: Antalgic gait, not assisted.  Psychiatric:     Comments: Well groomed, good eye contact.   ASSESSMENT AND PLAN:  Mr. SIYON LINCK was seen today for 6  months follow-up.  Orders Placed This Encounter  Procedures  . Lipid panel   Generalized osteoarthritis of multiple sites Problem is adequately controlled. We have discussed side effects of NSAID's, including CVD and worsening of CKD. At this time he would like to continue treatment with Diclofenac 75 mg daily prn. Fall prevention. Low impact regular exercise as tolerated.  Essential hypertension BP adequately controlled. Continue Metoprolol succinate ,Amlodipine,and Losartan-HCTZ same dose. Low salt diet.  Stage 3b chronic kidney disease ([place]) Problem has been stable, Cr 1.5-1.6 and e GFR mid 40's. We will continue monitoring renal function q 3-4 months. He understands that if problem starts getting worse we will have to discontinue NSAID's. Low salt diet and adequate hydration.  Mixed hyperlipidemia Continue Atorvastatin 40 mg daily and low salt diet. FLP with next blood work.  Atherosclerosis of aorta ([facility]) Continue Atorvastatin and Aspirin 81 mg daily.   Return in about 7 months (around 12/09/2020) for Due for AWV.Marland Kitchen   Gila Lauf G. [name], MD  [facility]. [place] office.   A few things to remember from today's visit:   Generalized osteoarthritis of multiple sites  Essential hypertension  Stage 3b chronic kidney disease ([place])  Mixed hyperlipidemia - Plan: Lipid panel  If you need refills please call your pharmacy. Do not use My Chart to request refills or for acute issues that need immediate attention.   Cholesterol can be checked with next blood work in 08/2020.  No changes in medications today. We will continue monitoring your kidney function regularly to be sure it is stable.  Please be sure medication list is accurate. If a new problem present, please set up appointment sooner than planned today.

## 2020-05-26 DIAGNOSIS — E538 Deficiency of other specified B group vitamins: Secondary | ICD-10-CM | POA: Diagnosis not present

## 2020-05-27 DIAGNOSIS — Z23 Encounter for immunization: Secondary | ICD-10-CM | POA: Diagnosis not present

## 2020-05-27 NOTE — Progress Notes (Signed)
Cardiology Office Note:    Date:  05/28/2020   ID:  Daryl Webb, DOB May 29, 1945, MRN [id number]  PCP:  [name], [name], MD  Cardiologist:  [name], MD   Referring MD: [name], [name], MD   Chief Complaint  Patient presents with  . Follow-up    TOC post-cath    History of Present Illness:    Daryl Webb is a 75 y.o. male with a hx of lipidemia, DM 2, aortic stenosis.  He was previously followed by Dr. Mare Ferrari and now [name].  He had annual surveillance with echocardiogram for his moderate aortic stenosis which had been stable until recently.  Due to progression of ALS, he was referred to the structural heart team for consideration of TAVR.  He has undergone right and left heart catheterization which showed nonobstructive CAD, medical therapy recommended.  EF 50 to 09%, grade 2 diastolic dysfunction.  He has mild nonobstructive carotid artery stenosis bilaterally by ultrasound April 2022. He was seen by [name] and reported sedentary lifestyle over the past 2-3 years. He has undergone imaging as part of TAVR workup which revealed renal lesion. Abdominal MRI was recommended prior to moving forward with TAVR.   He presents today for follow up TOC after heart cath. He has done well post-cath. He denies chest pain. He has ongoing shortness of breath with AS. Overall, doing well. We discussed starting a walking program as his SOB allows - currently sedentary.    Past Medical History:  Diagnosis Date  . Anemia   . Arthritis   . Constipation due to pain medication    needs stool softner while on pain medication  . Diabetes mellitus   . Duodenal ulcer 08/13/2015   2014  - Dr Kaplan/GI  . H/O: osteoarthritis   . History of prostate cancer   . History of urethral stricture   . Hypercholesterolemia   . Hypertension   . Ulcer duodenal hemorrhage     Past Surgical History:  Procedure Laterality Date  . ARTHROPLASTY  2004   right knee Dr. Wynelle Link  . CARPAL  TUNNEL RELEASE     right hand,wrist [name]  . CYSTOSCOPY N/A 09/20/2012   Procedure: CYSTOSCOPY;  Surgeon: [name], MD;  Location: [place] [facility];  Service: Urology;  Laterality: N/A;  . JOINT REPLACEMENT Right   . KNEE ARTHROSCOPY Right   . LUMBAR LAMINECTOMY  02/08/2013  . PROSTATECTOMY     radical  . RIGHT/LEFT HEART CATH AND CORONARY ANGIOGRAPHY N/A 05/08/2020   Procedure: RIGHT/LEFT HEART CATH AND CORONARY ANGIOGRAPHY;  Surgeon: [name], MD;  Location: [place] [facility];  Service: Cardiovascular;  Laterality: N/A;  . VASECTOMY  1985    Current Medications: Current Meds  Medication Sig  . ACCU-CHEK SMARTVIEW test strip Use to test blood sugars once daily  . amLODipine (NORVASC) 5 MG tablet TAKE 1 TABLET DAILY (Patient taking differently: Take 5 mg by mouth daily.)  . amoxicillin (AMOXIL) 500 MG capsule Take 2,000 mg by mouth See admin instructions. Dental procedures  . aspirin 81 MG tablet Take 81 mg by mouth daily.  Marland Kitchen atorvastatin (LIPITOR) 40 MG tablet TAKE 1 TABLET DAILY (Patient taking differently: 40 mg daily.)  . BYDUREON BCISE 2 MG/0.85ML AUIJ Inject 2 mg into the skin once a week. Thursday  . Cholecalciferol 2000 UNITS CAPS Take 2,000 Units by mouth 2 (two) times a week. VITAMIN D 3  . Coenzyme Q10 (COQ10) 100 MG CAPS Take 100  mg by mouth 2 (two) times a week.  . cyanocobalamin (,VITAMIN B-12,) 1000 MCG/ML injection Inject 1,000 mcg into the muscle every 30 (thirty) days.  . diclofenac (VOLTAREN) 75 MG EC tablet TAKE 1 TABLET DAILY AS     NEEDED (Patient taking differently: Take 75 mg by mouth daily.)  . loratadine (CLARITIN) 10 MG tablet Take 5 mg by mouth daily as needed for allergies.  Marland Kitchen losartan-hydrochlorothiazide (HYZAAR) 50-12.5 MG tablet TAKE 1 TABLET DAILY (Patient taking differently: Take 1 tablet by mouth daily.)  . metFORMIN (GLUCOPHAGE) 500 MG tablet Take 500 mg by mouth 2 (two) times daily.  . metoprolol succinate (TOPROL-XL) 50 MG 24 hr tablet TAKE  1 TABLET DAILY (Patient taking differently: Take 50 mg by mouth daily.)  . Omega-3 Fatty Acids (FISH OIL) 1000 MG CAPS Take 1,000 mg by mouth 2 (two) times a week.     Allergies:   Patient has no known allergies.   Social History   Socioeconomic History  . Marital status: Married    Spouse name: Not on file  . Number of children: 2  . Years of education: Not on file  . Highest education level: Bachelor's degree (e.g., BA, AB, BS)  Occupational History  . Occupation: Retired  Tobacco Use  . Smoking status: Former Smoker    Packs/day: 1.50    Years: 20.00    Pack years: 30.00    Types: Cigarettes    Quit date: 02/09/1980    Years since quitting: 40.3  . Smokeless tobacco: Never Used  Vaping Use  . Vaping Use: Never used  Substance and Sexual Activity  . Alcohol use: No  . Drug use: No  . Sexual activity: Not Currently  Other Topics Concern  . 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    Lives with wife,    two children in [place].    Enjoys reading, boating, travelling to [place] in the winter with wife.   Social Determinants of Health   Financial Resource Strain: Not on file  Food Insecurity: Not on file  Transportation Needs: No Transportation Needs  . Lack of Transportation (Medical): No  . Lack of Transportation (Non-Medical): No  Physical Activity: Inactive  . Days of Exercise per Week: 0 days  . Minutes of Exercise per Session: 0 min  Stress: Not on file  Social Connections: Not on file     Family History: The patient's family history includes Diabetes in his father; Heart disease in his father and mother; Heart failure in his mother.  ROS:   Please see the history of present illness.     All other systems reviewed and are negative.  EKGs/Labs/Other Studies Reviewed:    The following studies were reviewed today:  Heart cath 05/08/20 1.  Widely patent left main 2.  Widely patent LAD with ostial stenosis of a small  second diagonal branch, otherwise no significant obstruction in the LAD territory 3.  Patent left circumflex with mild to moderate nonobstructive plaquing in the mid vessel 4.  Patent RCA with mild nonobstructive stenosis, except for a 70% PDA stenosis in a relatively small caliber PDA branch 5.  Calcific aortic valve with restricted leaflet mobility on plain fluoroscopy.  Peak to peak transaortic gradient 43 mmHg, mean gradient 36 mmHg, calculated aortic valve area 1.17 cm  Recommendations: Patient with exertional symptoms, progressive aortic stenosis in the moderately severe range.  Will review with multidisciplinary heart team, arrange structural heart consultation,  arrange CT angiography studies, and consider options of continued close surveillance versus moving forward with aortic valve replacement.  Recommend medical therapy for coronary artery disease.   Echo 03/26/20: 1. Left ventricular ejection fraction, by estimation, is 50 to 55%. The  left ventricle has low normal function. The left ventricle demonstrates  regional wall motion abnormalities (see scoring diagram/findings for  description). There is moderate  concentric left ventricular hypertrophy. Left ventricular diastolic  parameters are consistent with Grade II diastolic dysfunction  (pseudonormalization). Elevated left ventricular end-diastolic pressure.  There is moderate hypokinesis of the left  ventricular, basal inferoseptal wall.  2. Right ventricular systolic function is normal. The right ventricular  size is normal.  3. Left atrial size was mild to moderately dilated.  4. The mitral valve is normal in structure. Trivial mitral valve  regurgitation. No evidence of mitral stenosis.  5. The aortic valve is calcified. There is severe calcifcation of the  aortic valve. There is moderate thickening of the aortic valve. Aortic  valve regurgitation is mild. Severe aortic valve stenosis. Aortic valve  area, by VTI  measures 0.99 cm. Aortic  valve mean gradient measures 34.0 mmHg. Aortic valve Vmax measures 4.00  m/s.    EKG:  EKG is ordered today.  The ekg ordered today demonstrates sinus rhythm with HR 68, LBBB, ST changes stable from prior  Recent Labs: 06/05/2019: ALT 18 05/02/2020: BUN 25; Creatinine, Ser 1.51; Platelets 204 05/08/2020: Hemoglobin 12.9; Potassium 4.0; Sodium 142  Recent Lipid Panel    Component Value Date/Time   CHOL 94 06/28/2017 0915   TRIG 112.0 06/28/2017 0915   HDL 27.90 (L) 06/28/2017 0915   CHOLHDL 3 06/28/2017 0915   VLDL 22.4 06/28/2017 0915   LDLCALC 43 06/28/2017 0915   LDLDIRECT 73.8 11/20/2012 0840    Physical Exam:    VS:  BP 130/80   Pulse 68   Ht 5\' 7"  (1.702 m)   Wt 176 lb 9.6 oz (80.1 kg)   BMI 27.66 kg/m     Wt Readings from Last 3 Encounters:  05/28/20 176 lb 9.6 oz (80.1 kg)  05/19/20 173 lb (78.5 kg)  05/16/20 173 lb (78.5 kg)     GEN:  Well nourished, well developed in no acute distress HEENT: Normal NECK: No JVD; No carotid bruits LYMPHATICS: No lymphadenopathy CARDIAC: RRR, 3/6 systolic murmur RESPIRATORY:  Clear to auscultation without rales, wheezing or rhonchi  ABDOMEN: Soft, non-tender, non-distended MUSCULOSKELETAL:  No edema; No deformity  SKIN: Warm and dry NEUROLOGIC:  Alert and oriented x 3 PSYCHIATRIC:  Normal affect  Right radial cath site C/D/I  ASSESSMENT:    1. Atherosclerosis of aorta ([place])   2. Coronary artery disease involving native coronary artery of native heart without angina pectoris   3. Aortic valve stenosis, etiology of cardiac valve disease unspecified   4. Essential hypertension   5. Mixed hyperlipidemia    PLAN:    In order of problems listed above:  Nonobstructive CAD - continue ASA, BB, statin, amlodipine. losartan-HCTZ - no chest pain - discussed starting a walking program as tolerated, understanding he has underlying dyspnea with AS   Aortic stenosis with progression of disease -  undergoing further evaluation with structural heart team - unclear if MRI needs to be ordered - will message K. Thompson PA-C   Hypertension - amlodipine, losartan-HCTZ, toprol - pressure well-controlled, no changes   Hyperlipidemia with LDL goal < 70 - given CAD, will need updated lipid profile - on 40 mg  lipitor and fish oil - has lipids checked with endo or PCP - last LDL 52 April 2021   Follow up with [name] in 6 months.    Medication Adjustments/Labs and Tests Ordered: Current medicines are reviewed at length with the patient today.  Concerns regarding medicines are outlined above.  No orders of the defined types were placed in this encounter.  No orders of the defined types were placed in this encounter.   Signed, Ledora Bottcher, PA  05/28/2020 12:20 PM    [facility]

## 2020-05-28 ENCOUNTER — Other Ambulatory Visit: Payer: Self-pay

## 2020-05-28 ENCOUNTER — Encounter: Payer: Self-pay | Admitting: Physician Assistant

## 2020-05-28 ENCOUNTER — Ambulatory Visit (INDEPENDENT_AMBULATORY_CARE_PROVIDER_SITE_OTHER): Payer: Medicare Other | Admitting: Physician Assistant

## 2020-05-28 VITALS — BP 130/80 | HR 68 | Ht 67.0 in | Wt 176.6 lb

## 2020-05-28 DIAGNOSIS — I10 Essential (primary) hypertension: Secondary | ICD-10-CM | POA: Diagnosis not present

## 2020-05-28 DIAGNOSIS — R9389 Abnormal findings on diagnostic imaging of other specified body structures: Secondary | ICD-10-CM

## 2020-05-28 DIAGNOSIS — I700 Atherosclerosis of aorta: Secondary | ICD-10-CM | POA: Diagnosis not present

## 2020-05-28 DIAGNOSIS — I2510 Atherosclerotic heart disease of native coronary artery without angina pectoris: Secondary | ICD-10-CM

## 2020-05-28 DIAGNOSIS — N2889 Other specified disorders of kidney and ureter: Secondary | ICD-10-CM

## 2020-05-28 DIAGNOSIS — E782 Mixed hyperlipidemia: Secondary | ICD-10-CM | POA: Diagnosis not present

## 2020-05-28 DIAGNOSIS — I350 Nonrheumatic aortic (valve) stenosis: Secondary | ICD-10-CM | POA: Diagnosis not present

## 2020-05-28 DIAGNOSIS — N289 Disorder of kidney and ureter, unspecified: Secondary | ICD-10-CM

## 2020-05-28 NOTE — Patient Instructions (Signed)
Medication Instructions:  The current medical regimen is effective;  continue present plan and medications.  *If you need a refill on your cardiac medications before your next appointment, please call your pharmacy*   Follow-Up: At [facility], you and your health needs are our priority.  As part of our continuing mission to provide you with exceptional heart care, we have created designated Provider Care Teams.  These Care Teams include your primary Cardiologist (physician) and Advanced Practice Providers (APPs -  Physician Assistants and Nurse Practitioners) who all work together to provide you with the care you need, when you need it.  We recommend signing up for the patient portal called "MyChart".  Sign up information is provided on this After Visit Summary.  MyChart is used to connect with patients for Virtual Visits (Telemedicine).  Patients are able to view lab/test results, encounter notes, upcoming appointments, etc.  Non-urgent messages can be sent to your provider as well.   To learn more about what you can do with MyChart, go to [email or web address].    Your next appointment:   6 month(s)  The format for your next appointment:   In Person  Provider:   K. Mali Hilty, MD

## 2020-06-06 ENCOUNTER — Ambulatory Visit (HOSPITAL_COMMUNITY)
Admission: RE | Admit: 2020-06-06 | Discharge: 2020-06-06 | Disposition: A | Discharge status: Home / Self Care | Payer: Medicare Other | Source: Ambulatory Visit | Attending: Physician Assistant | Admitting: Physician Assistant

## 2020-06-06 ENCOUNTER — Other Ambulatory Visit: Payer: Self-pay

## 2020-06-06 DIAGNOSIS — N289 Disorder of kidney and ureter, unspecified: Secondary | ICD-10-CM | POA: Diagnosis not present

## 2020-06-06 DIAGNOSIS — N2889 Other specified disorders of kidney and ureter: Secondary | ICD-10-CM | POA: Insufficient documentation

## 2020-06-06 DIAGNOSIS — N281 Cyst of kidney, acquired: Secondary | ICD-10-CM | POA: Diagnosis not present

## 2020-06-06 DIAGNOSIS — R9389 Abnormal findings on diagnostic imaging of other specified body structures: Secondary | ICD-10-CM | POA: Diagnosis not present

## 2020-06-06 DIAGNOSIS — I700 Atherosclerosis of aorta: Secondary | ICD-10-CM | POA: Diagnosis not present

## 2020-06-06 MED ORDER — GADOBUTROL 1 MMOL/ML IV SOLN
8.0000 mL | Freq: Once | INTRAVENOUS | Status: AC | PRN
  Administered 2020-06-06: 8 mL via INTRAVENOUS

## 2020-06-20 DIAGNOSIS — N1832 Chronic kidney disease, stage 3b: Secondary | ICD-10-CM | POA: Diagnosis not present

## 2020-06-20 DIAGNOSIS — Z8639 Personal history of other endocrine, nutritional and metabolic disease: Secondary | ICD-10-CM | POA: Diagnosis not present

## 2020-06-20 DIAGNOSIS — Z7984 Long term (current) use of oral hypoglycemic drugs: Secondary | ICD-10-CM | POA: Diagnosis not present

## 2020-06-20 DIAGNOSIS — E1122 Type 2 diabetes mellitus with diabetic chronic kidney disease: Secondary | ICD-10-CM | POA: Diagnosis not present

## 2020-07-02 DIAGNOSIS — E538 Deficiency of other specified B group vitamins: Secondary | ICD-10-CM | POA: Diagnosis not present

## 2020-07-10 ENCOUNTER — Telehealth: Payer: Self-pay | Admitting: Family Medicine

## 2020-07-10 NOTE — Telephone Encounter (Signed)
Left message for patient to call back and schedule Medicare Annual Wellness Visit (AWV) either virtually or in office.   Last AWV 03/13/19  please schedule at anytime with [facility] Nurse Health Advisor 1 or 2   This should be a 45 minute visit.

## 2020-07-10 NOTE — Telephone Encounter (Signed)
Disregard  Just notice patient has AWV appointment scheduled in 12/2020

## 2020-07-15 ENCOUNTER — Encounter: Payer: Self-pay | Admitting: Physician Assistant

## 2020-07-15 DIAGNOSIS — I350 Nonrheumatic aortic (valve) stenosis: Secondary | ICD-10-CM | POA: Insufficient documentation

## 2020-07-20 ENCOUNTER — Other Ambulatory Visit: Payer: Self-pay | Admitting: Internal Medicine

## 2020-07-30 ENCOUNTER — Encounter: Payer: Self-pay | Admitting: Surgery

## 2020-07-30 ENCOUNTER — Ambulatory Visit: Payer: Medicare Other | Attending: Cardiovascular Disease | Admitting: Physical Therapy

## 2020-07-30 ENCOUNTER — Encounter: Payer: Self-pay | Admitting: Physical Therapy

## 2020-07-30 ENCOUNTER — Institutional Professional Consult (permissible substitution) (INDEPENDENT_AMBULATORY_CARE_PROVIDER_SITE_OTHER): Payer: Medicare Other | Admitting: Surgery

## 2020-07-30 ENCOUNTER — Other Ambulatory Visit: Payer: Self-pay

## 2020-07-30 VITALS — BP 125/72 | HR 76 | Resp 20 | Ht 67.0 in | Wt 164.0 lb

## 2020-07-30 DIAGNOSIS — R2689 Other abnormalities of gait and mobility: Secondary | ICD-10-CM

## 2020-07-30 DIAGNOSIS — I2510 Atherosclerotic heart disease of native coronary artery without angina pectoris: Secondary | ICD-10-CM | POA: Diagnosis not present

## 2020-07-30 DIAGNOSIS — I350 Nonrheumatic aortic (valve) stenosis: Secondary | ICD-10-CM | POA: Diagnosis not present

## 2020-07-30 NOTE — Progress Notes (Signed)
Patient ID: Daryl Webb, male   DOB: 01-Jan-1946, 75 y.o.   MRN: [id number]  [facility] CONSULTATION REPORT  PCP is [name], [name], MD Referring Provider is [name], MD Primary Cardiologist is [name], MD  Reason for consultation: Severe aortic stenosis.  HPI:  The patient is a 75 year old gentleman with a history of hypertension, hyperlipidemia, diabetes, stage III chronic kidney disease and aortic stenosis who has been followed by [name].  Previous echocardiograms have shown moderate aortic stenosis.  His most recent echocardiogram on 03/26/2020 showed a severely calcified aortic valve with a mean gradient of 34 mmHg and a peak gradient of 64 mmHg.  Aortic valve area was 0.99 cm by VTI.  His previous echo in January 2021 it showed a mean gradient of 28 mmHg.  His left ventricular ejection fraction is low normal at 50 to 55%.  There is moderate concentric LVH with grade 2 diastolic dysfunction.  He reports not being very active over the past 2 to 3 years due to [place].  He has shortness of breath with moderate exertion such as walking up a flight of stairs or up hills.  He reports exertional fatigue.  He has had some dizziness when getting up out of bed in the middle the night to go to the bathroom.  He denies any chest pain or pressure.  He has had no peripheral edema.  He is retired and lives at home with his wife.  They enjoy traveling in an RV.  Past Medical History:  Diagnosis Date   Anemia    Arthritis    Constipation due to pain medication    needs stool softner while on pain medication   Diabetes mellitus    Duodenal ulcer 08/13/2015   2014  - Dr Kaplan/GI   H/O: osteoarthritis    History of prostate cancer    History of urethral stricture    Hypercholesterolemia    Hypertension    Severe aortic stenosis    Ulcer duodenal hemorrhage     Past Surgical History:   Procedure Laterality Date   ARTHROPLASTY  2004   right knee Dr. Wynelle Link   CARPAL TUNNEL RELEASE     right hand,wrist [name]   CYSTOSCOPY N/A 09/20/2012   Procedure: Consuela Mimes;  Surgeon: [name], MD;  Location: [facility];  Service: Urology;  Laterality: N/A;   JOINT REPLACEMENT Right    KNEE ARTHROSCOPY Right    LUMBAR LAMINECTOMY  02/08/2013   PROSTATECTOMY     radical   RIGHT/LEFT HEART CATH AND CORONARY ANGIOGRAPHY N/A 05/08/2020   Procedure: RIGHT/LEFT HEART CATH AND CORONARY ANGIOGRAPHY;  Surgeon: [name], MD;  Location: [facility];  Service: Cardiovascular;  Laterality: N/A;   VASECTOMY  1985    Family History  Problem Relation Age of Onset   Heart disease Mother    Heart failure Mother    Heart disease Father    Diabetes Father     Social History   Socioeconomic History   Marital status: Married    Spouse name: Not on file   Number of children: 2   Years of education: Not on file   Highest education level: Bachelor's degree (e.g., BA, AB, BS)  Occupational History   Occupation: Retired  Tobacco Use   Smoking status: Former    Packs/day: 1.50    Years: 20.00  Pack years: 30.00    Types: Cigarettes    Quit date: 02/09/1980    Years since quitting: 40.4   Smokeless tobacco: Never  Vaping Use   Vaping Use: Never used  Substance and Sexual Activity   Alcohol use: No   Drug use: No   Sexual activity: Not Currently  Other Topics Concern   Not on file  Social History Narrative   Retired from [employer] ([employer]).    Lives with wife,    two children in [place].    Enjoys reading, boating, travelling to [place] in the winter with wife.   Social Determinants of Health   Financial Resource Strain: Not on file  Food Insecurity: Not on file  Transportation Needs: No Transportation Needs   Lack of Transportation (Medical): No   Lack of Transportation (Non-Medical): No  Physical Activity: Inactive   Days of Exercise per Week: 0  days   Minutes of Exercise per Session: 0 min  Stress: Not on file  Social Connections: Not on file  Intimate Partner Violence: Not on file    Prior to Admission medications   Medication Sig Start Date End Date Taking? Authorizing Provider  ACCU-CHEK SMARTVIEW test strip Use to test blood sugars once daily 07/19/19  Yes [provider]  amLODipine (NORVASC) 5 MG tablet TAKE 1 TABLET DAILY Patient taking differently: Take 5 mg by mouth daily. 01/30/20  Yes Hilty, Nadean Corwin, MD  amoxicillin (AMOXIL) 500 MG capsule Take 2,000 mg by mouth See admin instructions. Dental procedures 06/15/17  Yes [provider]  aspirin 81 MG tablet Take 81 mg by mouth daily.   Yes [provider]  atorvastatin (LIPITOR) 40 MG tablet TAKE 1 TABLET DAILY Patient taking differently: 40 mg daily. 01/30/20  Yes Hilty, Nadean Corwin, MD  BYDUREON BCISE 2 MG/0.85ML AUIJ Inject 2 mg into the skin once a week. Thursday 10/18/19  Yes [provider]  Cholecalciferol 2000 UNITS CAPS Take 2,000 Units by mouth 2 (two) times a week. VITAMIN D 3   Yes [provider]  Coenzyme Q10 (COQ10) 100 MG CAPS Take 100 mg by mouth 2 (two) times a week.   Yes [provider]  cyanocobalamin (,VITAMIN B-12,) 1000 MCG/ML injection Inject 1,000 mcg into the muscle every 30 (thirty) days.   Yes [provider]  dapagliflozin propanediol (FARXIGA) 5 MG TABS tablet Take 5 mg by mouth daily.   Yes [provider]  diclofenac (VOLTAREN) 75 MG EC tablet TAKE 1 TABLET DAILY AS     NEEDED Patient taking differently: Take 75 mg by mouth daily. 12/28/19  Yes [name], [name], MD  loratadine (CLARITIN) 10 MG tablet Take 5 mg by mouth daily as needed for allergies.   Yes [provider]  losartan-hydrochlorothiazide (HYZAAR) 50-12.5 MG tablet TAKE 1 TABLET DAILY Patient taking differently: Take 1 tablet by mouth daily. 07/30/19  Yes Hilty, Nadean Corwin, MD  metFORMIN (GLUCOPHAGE) 500 MG  tablet Take 500 mg by mouth 2 (two) times daily. 05/01/19  Yes [provider]  metoprolol succinate (TOPROL-XL) 50 MG 24 hr tablet TAKE 1 TABLET DAILY 07/21/20  Yes Hilty, Nadean Corwin, MD  Omega-3 Fatty Acids (FISH OIL) 1000 MG CAPS Take 1,000 mg by mouth 2 (two) times a week.   Yes [provider]    Current Outpatient Medications  Medication Sig Dispense Refill   ACCU-CHEK SMARTVIEW test strip Use to test blood sugars once daily     amLODipine (NORVASC) 5 MG  tablet TAKE 1 TABLET DAILY (Patient taking differently: Take 5 mg by mouth daily.) 90 tablet 1   amoxicillin (AMOXIL) 500 MG capsule Take 2,000 mg by mouth See admin instructions. Dental procedures  2   aspirin 81 MG tablet Take 81 mg by mouth daily.     atorvastatin (LIPITOR) 40 MG tablet TAKE 1 TABLET DAILY (Patient taking differently: 40 mg daily.) 90 tablet 1   BYDUREON BCISE 2 MG/0.85ML AUIJ Inject 2 mg into the skin once a week. Thursday     Cholecalciferol 2000 UNITS CAPS Take 2,000 Units by mouth 2 (two) times a week. VITAMIN D 3     Coenzyme Q10 (COQ10) 100 MG CAPS Take 100 mg by mouth 2 (two) times a week.     cyanocobalamin (,VITAMIN B-12,) 1000 MCG/ML injection Inject 1,000 mcg into the muscle every 30 (thirty) days.     dapagliflozin propanediol (FARXIGA) 5 MG TABS tablet Take 5 mg by mouth daily.     diclofenac (VOLTAREN) 75 MG EC tablet TAKE 1 TABLET DAILY AS     NEEDED (Patient taking differently: Take 75 mg by mouth daily.) 90 tablet 2   loratadine (CLARITIN) 10 MG tablet Take 5 mg by mouth daily as needed for allergies.     losartan-hydrochlorothiazide (HYZAAR) 50-12.5 MG tablet TAKE 1 TABLET DAILY (Patient taking differently: Take 1 tablet by mouth daily.) 90 tablet 3   metFORMIN (GLUCOPHAGE) 500 MG tablet Take 500 mg by mouth 2 (two) times daily.     metoprolol succinate (TOPROL-XL) 50 MG 24 hr tablet TAKE 1 TABLET DAILY 90 tablet 2   Omega-3 Fatty Acids (FISH OIL) 1000 MG CAPS Take 1,000 mg by mouth 2  (two) times a week.     No current facility-administered medications for this visit.    No Known Allergies    Review of Systems:   General:  normal appetite, + decreased energy, no weight gain, no weight loss, no fever  Cardiac:  no chest pain with exertion, no chest pain at rest, +SOB with moderate exertion, no resting SOB, no PND, no orthopnea, no palpitations, no arrhythmia, no atrial fibrillation, no LE edema, + dizzy spells, no syncope  Respiratory:  + exertional shortness of breath, no home oxygen, no productive cough, no dry cough, no bronchitis, no wheezing, no hemoptysis, no asthma, no pain with inspiration or cough, no sleep apnea, no CPAP at night  GI:   no difficulty swallowing, no reflux, no frequent heartburn, no hiatal hernia, no abdominal pain, no constipation, no diarrhea, no hematochezia, no hematemesis, no melena  GU:   no dysuria,  no frequency, no urinary tract infection, no hematuria, no enlarged prostate, + remote kidney stones, + chronic kidney disease  Vascular:  no pain suggestive of claudication, no pain in feet, no leg cramps, no varicose veins, no DVT, no non-healing foot ulcer  Neuro:   no stroke, no TIA's, no seizures, no headaches, no temporary blindness one eye,  no slurred speech, no peripheral neuropathy, no chronic pain, no instability of gait, no memory/cognitive dysfunction  Musculoskeletal: + arthritis , no joint swelling, no myalgias, no difficulty walking, normal mobility   Skin:   + rash ( jock rash which he is using Lotrimin for, + itching in groins from rash, no skin infections, no pressure sores or ulcerations  Psych:   no anxiety, no depression, no nervousness, no unusual recent stress  Eyes:   no blurry vision, no floaters, no recent vision changes, + wears glasses or  contacts  ENT:   no hearing loss, no loose or painful teeth, no dentures, last saw dentist May 2022  Hematologic:  no easy bruising, no abnormal bleeding, no clotting disorder, no  frequent epistaxis  Endocrine:  + diabetes, does check CBG's at home     Physical Exam:   BP 125/72 (BP Location: Left Arm, Patient Position: Sitting)   Pulse 76   Resp 20   Ht 5\' 7"  (1.702 m)   Wt 164 lb (74.4 kg)   SpO2 96% Comment: RA  BMI 25.69 kg/m   General:  well-appearing  HEENT:  Unremarkable, NCAT, PERLA, EOMI  Neck:   no JVD, no bruits, no adenopathy   Chest:   clear to auscultation, symmetrical breath sounds, no wheezes, no rhonchi   CV:   RRR, 3/6 systolic murmur RSB, no diastolic murmur  Abdomen:  soft, non-tender, no masses   Extremities:  warm, well-perfused, pulses palpable in ankles,  lower extremity edema  Rectal/GU  Deferred  Neuro:   Grossly non-focal and symmetrical throughout  Skin:   Clean and dry, no rashes, no breakdown  Diagnostic Tests:  ECHOCARDIOGRAM REPORT         Patient Name:   STANCIL DEISHER Date of Exam: 03/26/2020  Medical Rec #:  [id number]            Height:       67.0 in  Accession #:    [id number]           Weight:       175.0 lb  Date of Birth:  Mar 25, 1945            BSA:          1.911 m  Patient Age:    34 years             BP:           120/78 mmHg  Patient Gender: M                    HR:           78 bpm.  Exam Location:  [address]   Procedure: 2D Echo, Cardiac Doppler and Color Doppler   Indications:    I35.0 Nonrheumatic aortic (valve) stenosis     History:        Patient has prior history of Echocardiogram examinations,  most                  recent 02/12/2019. Signs/Symptoms:Dyspnea; Risk                  Factors:Hypertension, Diabetes and Dyslipidemia. Chronic  kidney                  disease. Low back pain.     Sonographer:    Diamond Nickel RCS  Referring Phys: [place]     1. Left ventricular ejection fraction, by estimation, is 50 to 55%. The  left ventricle has low normal function. The left ventricle demonstrates  regional wall motion abnormalities (see scoring  diagram/findings for  description). There is moderate  concentric left ventricular hypertrophy. Left ventricular diastolic  parameters are consistent with Grade II diastolic dysfunction  (pseudonormalization). Elevated left ventricular end-diastolic pressure.  There is moderate hypokinesis of the left  ventricular, basal inferoseptal wall.   2. Right ventricular systolic function is normal. The right ventricular  size is normal.   3. Left atrial  size was mild to moderately dilated.   4. The mitral valve is normal in structure. Trivial mitral valve  regurgitation. No evidence of mitral stenosis.   5. The aortic valve is calcified. There is severe calcifcation of the  aortic valve. There is moderate thickening of the aortic valve. Aortic  valve regurgitation is mild. Severe aortic valve stenosis. Aortic valve  area, by VTI measures 0.99 cm. Aortic  valve mean gradient measures 34.0 mmHg. Aortic valve Vmax measures 4.00  m/s.   Comparison(s): Changes from prior study are noted.   Conclusion(s)/Recommendation(s): Since last study, AS is severe. Peak  velocity is 4 m/s, AVA is 0.99.   FINDINGS   Left Ventricle: Left ventricular ejection fraction, by estimation, is 50  to 55%. The left ventricle has low normal function. The left ventricle  demonstrates regional wall motion abnormalities. Moderate hypokinesis of  the left ventricular, basal  inferoseptal wall. The left ventricular internal cavity size was normal in  size. There is moderate concentric left ventricular hypertrophy. Left  ventricular diastolic parameters are consistent with Grade II diastolic  dysfunction (pseudonormalization).  Elevated left ventricular end-diastolic pressure.   Right Ventricle: The right ventricular size is normal. No increase in  right ventricular wall thickness. Right ventricular systolic function is  normal.   Left Atrium: Left atrial size was mild to moderately dilated.   Right Atrium: Right  atrial size was normal in size.   Pericardium: There is no evidence of pericardial effusion.   Mitral Valve: The mitral valve is normal in structure. Mild mitral annular  calcification. Trivial mitral valve regurgitation. No evidence of mitral  valve stenosis.   Tricuspid Valve: The tricuspid valve is normal in structure. Tricuspid  valve regurgitation is trivial. No evidence of tricuspid stenosis.   Aortic Valve: The aortic valve is calcified. There is severe calcifcation  of the aortic valve. There is moderate thickening of the aortic valve.  There is moderate aortic valve annular calcification. Aortic valve  regurgitation is mild. Aortic  regurgitation PHT measures 751 msec. Severe aortic stenosis is present.  Aortic valve mean gradient measures 34.0 mmHg. Aortic valve peak gradient  measures 64.0 mmHg. Aortic valve area, by VTI measures 0.99 cm.   Pulmonic Valve: The pulmonic valve was grossly normal. Pulmonic valve  regurgitation is not visualized.   Aorta: The aortic root and ascending aorta are structurally normal, with  no evidence of dilitation and the aortic arch was not well visualized.   Venous: The inferior vena cava was not well visualized.   IAS/Shunts: The atrial septum is grossly normal.      LEFT VENTRICLE  PLAX 2D  LVIDd:         5.00 cm  Diastology  LVIDs:         3.50 cm  LV e' medial:    4.35 cm/s  LV PW:         1.20 cm  LV E/e' medial:  20.7  LV IVS:        1.40 cm  LV e' lateral:   5.03 cm/s  LVOT diam:     2.20 cm  LV E/e' lateral: 17.9  LV SV:         81  LV SV Index:   43  LVOT Area:     3.80 cm      RIGHT VENTRICLE  RV Basal diam:  1.30 cm  RV S prime:     16.70 cm/s  TAPSE (M-mode): 2.8 cm   LEFT  ATRIUM             Index       RIGHT ATRIUM          Index  LA diam:        3.80 cm 1.99 cm/m  RA Area:     9.68 cm  LA Vol (A2C):   54.9 ml 28.73 ml/m RA Volume:   15.00 ml 7.85 ml/m  LA Vol (A4C):   61.7 ml 32.29 ml/m  LA Biplane Vol:  62.7 ml 32.82 ml/m   AORTIC VALVE  AV Area (Vmax):    0.81 cm  AV Area (Vmean):   0.78 cm  AV Area (VTI):     0.99 cm  AV Vmax:           400.00 cm/s  AV Vmean:          266.000 cm/s  AV VTI:            0.819 m  AV Peak Grad:      64.0 mmHg  AV Mean Grad:      34.0 mmHg  LVOT Vmax:         85.50 cm/s  LVOT Vmean:        54.900 cm/s  LVOT VTI:          0.214 m  LVOT/AV VTI ratio: 0.26  AI PHT:            751 msec     AORTA  Ao Root diam: 3.70 cm   MITRAL VALVE  MV Area (PHT): 3.47 cm    SHUNTS  MV Decel Time: 219 msec    Systemic VTI:  0.21 m  MV E velocity: 89.83 cm/s  Systemic Diam: 2.20 cm  MV A velocity: 92.27 cm/s  MV E/A ratio:  0.97   Buford Dresser MD  Electronically signed by Buford Dresser MD  Signature Date/Time: 03/26/2020/5:37:13 PM     Physicians  Panel Physicians Referring Physician Case Authorizing Physician  [name], MD (Primary)      Procedures  RIGHT/LEFT HEART CATH AND CORONARY ANGIOGRAPHY    Conclusion  1.  Widely patent left main 2.  Widely patent LAD with ostial stenosis of a small second diagonal branch, otherwise no significant obstruction in the LAD territory 3.  Patent left circumflex with mild to moderate nonobstructive plaquing in the mid vessel 4.  Patent RCA with mild nonobstructive stenosis, except for a 70% PDA stenosis in a relatively small caliber PDA branch 5.  Calcific aortic valve with restricted leaflet mobility on plain fluoroscopy.  Peak to peak transaortic gradient 43 mmHg, mean gradient 36 mmHg, calculated aortic valve area 1.17 cm   Recommendations: Patient with exertional symptoms, progressive aortic stenosis in the moderately severe range.  Will review with multidisciplinary heart team, arrange structural heart consultation, arrange CT angiography studies, and consider options of continued close surveillance versus moving forward with aortic valve replacement.  Recommend medical therapy for  coronary artery disease.    Indications  Severe aortic stenosis [I35.0 (ICD-10-CM)]    Procedural Details  Technical Details INDICATION: 76 year old gentleman with progressive aortic stenosis, mild exertional dyspnea likely New York Heart Association functional class II symptoms.  Echo data reveals moderately severe aortic stenosis.  The patient is referred for right and left heart catheterization for further assessment.  PROCEDURAL DETAILS: There was an indwelling IV in a right antecubital vein.  The IV could not be successfully changed out over a guidewire.  The IV is  removed and manual pressure used for hemostasis.  Using direct ultrasound guidance, an antecubital vein was accessed and a 7 French sheath was inserted.  Ultrasound images are digitally captured and stored in the patient's chart.  The right wrist was then prepped, draped, and anesthetized with 1% lidocaine. Using the modified Seldinger technique a 5/6 French Slender sheath was placed in the right radial artery. Intra-arterial verapamil was administered through the radial artery sheath. IV heparin was administered after a JR4 catheter was advanced into the central aorta. A Swan-Ganz catheter was used for the right heart catheterization. Standard protocol was followed for recording of right heart pressures and sampling of oxygen saturations. Fick cardiac output was calculated. Standard Judkins catheters were used for selective coronary angiography. LV pressure is recorded and an aortic valve pullback is performed.  The aortic valve is difficult to cross.  It is crossed with an AL-1 catheter and a straight tip wire.  There were no immediate procedural complications. The patient was transferred to the post catheterization recovery area for further monitoring.    Estimated blood loss <50 mL.   During this procedure medications were administered to achieve and maintain moderate conscious sedation while the patient's heart rate, blood  pressure, and oxygen saturation were continuously monitored and I was present face-to-face 100% of this time.    Medications (Filter: Administrations occurring from 1005 to 1055 on 05/08/20)  important  Continuous medications are totaled by the amount administered until 05/08/20 1055.    Heparin (Porcine) in NaCl 1000-0.9 UT/500ML-% SOLN (mL) Total volume:  1,000 mL  Date/Time Rate/Dose/Volume Action   05/08/20 1013 500 mL Given   1013 500 mL Given    midazolam (VERSED) injection (mg) Total dose:  2 mg  Date/Time Rate/Dose/Volume Action   05/08/20 1014 2 mg Given    fentaNYL (SUBLIMAZE) injection (mcg) Total dose:  25 mcg  Date/Time Rate/Dose/Volume Action   05/08/20 1014 25 mcg Given    lidocaine (PF) (XYLOCAINE) 1 % injection (mL) Total volume:  4 mL  Date/Time Rate/Dose/Volume Action   05/08/20 1023 2 mL Given   1028 2 mL Given    Radial Cocktail/Verapamil only (mL) Total volume:  10 mL  Date/Time Rate/Dose/Volume Action   05/08/20 1030 10 mL Given    heparin sodium (porcine) injection (Units) Total dose:  4,000 Units  Date/Time Rate/Dose/Volume Action   05/08/20 1035 4,000 Units Given    iohexol (OMNIPAQUE) 350 MG/ML injection (mL) Total volume:  45 mL  Date/Time Rate/Dose/Volume Action   05/08/20 1050 45 mL Given     Sedation Time  Sedation Time Physician-1: 35 minutes 3 seconds   Contrast  Medication Name Total Dose  iohexol (OMNIPAQUE) 350 MG/ML injection 45 mL    Radiation/Fluoro  Fluoro time: 5.7 (min) DAP: 12.1 (Gycm2) Cumulative Air Kerma: 216.9 (mGy)   Coronary Findings   Diagnostic Dominance: Right  Left Main  The vessel exhibits minimal luminal irregularities.  Left Anterior Descending  There is mild diffuse disease throughout the vessel.  Left Circumflex  Mid Cx to Dist Cx lesion is 50% stenosed.  First Obtuse Marginal Branch  1st Mrg lesion is 30% stenosed. Large high OM branch, supplies a large territory  Right  Coronary Artery  Prox RCA lesion is 40% stenosed.  Mid RCA lesion is 30% stenosed.  Right Posterior Descending Artery  RPDA lesion is 70% stenosed.   Intervention   No interventions have been documented.         Left Heart  Aortic  Valve The aortic valve is calcified. There is restricted aortic valve motion.    Coronary Diagrams   Diagnostic Dominance: Right    Intervention     Implants     No implant documentation for this case.    Syngo Images   Show images for CARDIAC CATHETERIZATION  Images on Long Term Storage   Show images for [place]st, SADIE PICKAR to Procedure Log  Procedure Log      Hemo Data  Flowsheet Row Most Recent Value  Fick Cardiac Output 6.46 L/min  Fick Cardiac Output Index 3.42 (L/min)/BSA  Aortic Mean Gradient 36.2 mmHg  Aortic Peak Gradient 43 mmHg  Aortic Valve Area 1.17  Aortic Value Area Index 0.62 cm2/BSA  RA A Wave 6 mmHg  RA V Wave 3 mmHg  RA Mean 3 mmHg  RV Systolic Pressure 34 mmHg  RV Diastolic Pressure 0 mmHg  RV EDP 4 mmHg  PA Systolic Pressure 28 mmHg  PA Diastolic Pressure 10 mmHg  PA Mean 19 mmHg  PW A Wave 17 mmHg  PW V Wave 16 mmHg  PW Mean 13 mmHg  AO Systolic Pressure 372 mmHg  AO Diastolic Pressure 66 mmHg  AO Mean 92 mmHg  LV Systolic Pressure 902 mmHg  LV Diastolic Pressure 0 mmHg  LV EDP 15 mmHg  AOp Systolic Pressure 111 mmHg  AOp Diastolic Pressure 67 mmHg  AOp Mean Pressure 94 mmHg  LVp Systolic Pressure 552 mmHg  LVp Diastolic Pressure 0 mmHg  LVp EDP Pressure 16 mmHg  QP/QS 1  TPVR Index 5.55 HRUI  TSVR Index 26.86 HRUI  PVR SVR Ratio 0.07  TPVR/TSVR Ratio 0.21    ADDENDUM REPORT: 05/14/2020 14:49   CLINICAL DATA:  63 -year-old male with severe aortic stenosis being evaluated for a TAVR procedure.   EXAM: Cardiac TAVR CT   TECHNIQUE: The patient was scanned on a [employer]. A 120 kV retrospective scan was triggered in the descending thoracic aorta  at 111 HU's. Gantry rotation speed was 250 msecs and collimation was .6 mm. No beta blockade or nitro were given. The 3D data set was reconstructed in 5% intervals of the R-R cycle. Systolic and diastolic phases were analyzed on a dedicated work station using MPR, MIP and VRT modes. The patient received 80 cc of contrast.   FINDINGS: Aortic Root:   Aortic valve: Trileaflet   Aortic valve calcium score: Calcium score 3435   Aortic annulus:   Diameter: 100mm x 16mm   Perimeter: 80mm   Area: 654 mm^2   Calcifications: No calcifications   Coronary height: Min Left - 57mm, Max Left - 43mm; Min Right - 81mm   Sinotubular height: Left cusp - 40mm; Right cusp - 36mm; Noncoronary cusp - 12mm   LVOT (as measured 3 mm below the annulus):   Diameter: 107mm x 101mm   Area: 694 mm^2   Calcifications: No calcifications   Aortic sinus width: Left cusp - 45mm; Right cusp - 74mm; Noncoronary cusp - 49mm   Sinotubular junction width: 53mm x 49mm   Optimum Fluoroscopic Angle for Delivery: LAO 2 CAU 8   Cardiac:   Right atrium: Normal size   Right ventricle: Normal size   Pulmonary arteries: Normal size   Pulmonary veins: Normal configuration   Left atrium: Mild enlargement   Left ventricle: Mild dilatation   Pericardium: Normal thickness   Coronary arteries: Coronary calcium score 807 (74th percentile)   IMPRESSION: 1. Trileaflet aortic valve with severe calcifications (  AV calcium score 3435)   2. Aortic annulus measures 2mm x 30mm in diameter with perimeter 72mm and area 654 mm^2. No annular or LVOT calcifications. Annular measurements suitable for delivery of 42mm Edwards-Sapien 3 valve.   2. Sufficient coronary to annulus distance, measuring 93mm to left main and 83mm to RCA   3. Optimum Fluoroscopic Angle for Delivery: LAO 2 CAU 8   4. Coronary calcium score 807 (74th percentile)     Electronically Signed   By: Oswaldo Milian MD   On: 05/14/2020  14:49    Addended by Donato Heinz, MD on 05/14/2020  2:51 PM    Study Result  Narrative & Impression  EXAM: OVER-READ INTERPRETATION  CT CHEST   The following report is an over-read performed by radiologist Dr. Vinnie Langton of [facility], [place] on 05/14/2020. This over-read does not include interpretation of cardiac or coronary anatomy or pathology. The coronary calcium score/coronary CTA interpretation by the cardiologist is attached.   COMPARISON:  No priors.   FINDINGS: Extracardiac findings will be described separately under dictation for contemporaneously obtained CTA chest, abdomen and pelvis.   IMPRESSION: Please see separate dictation for contemporaneously obtained CTA chest, abdomen and pelvis dated 05/14/2020 for full description of relevant extracardiac findings.   Electronically Signed: By: Vinnie Langton M.D. On: 05/14/2020 13:04    Narrative & Impression  CLINICAL DATA:  75 year old male with history of severe aortic stenosis under preprocedural evaluation prior to potential transcatheter aortic valve replacement (TAVR) procedure.   EXAM: CT ANGIOGRAPHY CHEST, ABDOMEN AND PELVIS   TECHNIQUE: Non-contrast CT of the chest was initially obtained.   Multidetector CT imaging through the chest, abdomen and pelvis was performed using the standard protocol during bolus administration of intravenous contrast. Multiplanar reconstructed images and MIPs were obtained and reviewed to evaluate the vascular anatomy.   CONTRAST:  3mL OMNIPAQUE IOHEXOL 350 MG/ML SOLN   COMPARISON:  CT the abdomen and pelvis 01/23/2016.   FINDINGS: CTA CHEST FINDINGS   Cardiovascular: Heart size is mildly enlarged. There is no significant pericardial fluid, thickening or pericardial calcification. There is aortic atherosclerosis, as well as atherosclerosis of the great vessels of the mediastinum and the coronary arteries, including calcified atherosclerotic  plaque in the left main, left anterior descending, left circumflex and right coronary arteries. Severe thickening calcification of the aortic valve.   Mediastinum/Lymph Nodes: No pathologically enlarged mediastinal or hilar lymph nodes. Esophagus is unremarkable in appearance. No axillary lymphadenopathy.   Lungs/Pleura: No acute consolidative airspace disease. No pleural effusions. No suspicious appearing pulmonary nodules or masses are noted.   Musculoskeletal/Soft Tissues: There are no aggressive appearing lytic or blastic lesions noted in the visualized portions of the skeleton.   CTA ABDOMEN AND PELVIS FINDINGS   Hepatobiliary: No suspicious cystic or solid hepatic lesions. No intra or extrahepatic biliary ductal dilatation. Gallbladder is normal in appearance.   Pancreas: No pancreatic mass. No pancreatic ductal dilatation. No pancreatic or peripancreatic fluid collections or inflammatory changes.   Spleen: Unremarkable.   Adrenals/Urinary Tract: Mild atrophy of the kidneys bilaterally. In the interpolar region of the right kidney there is an exophytic 2 cm low-intermediate attenuation (26 HU) lesion which is incompletely characterized, but significantly larger than prior study from 2017 at which point this lesion measured only 9 mm. No hydroureteronephrosis. Urinary bladder is normal in appearance.   Stomach/Bowel: The appearance of the stomach is normal. No pathologic dilatation of small bowel or colon. Normal appendix.   Vascular/Lymphatic: Aortic atherosclerosis,  without evidence of aneurysm or dissection in the abdominal or pelvic vasculature. Vascular findings and measurements pertinent to potential TAVR procedure, as detailed below. No lymphadenopathy noted in the abdomen or pelvis.   Reproductive: Status post radical prostatectomy.   Other: No significant volume of ascites.  No pneumoperitoneum.   Musculoskeletal: Well-defined 2.4 x 1.0 cm sclerotic  lesion in the left ilium just lateral to the left sacroiliac joint, stable compared to prior study from 2017, presumably a large bone island. Status post PLIF from L3-L5 with interbody grafts at L3-L4 and L4-L5. There are no other aggressive appearing lytic or blastic lesions noted in the visualized portions of the skeleton.   VASCULAR MEASUREMENTS PERTINENT TO TAVR:   AORTA:   Minimal Aortic Diameter-15 x 14 mm   Severity of Aortic Calcification-moderate to severe   RIGHT PELVIS:   Right Common Iliac Artery -   Minimal Diameter-7.3 x 8.0 mm   Tortuosity-mild   Calcification-moderate to severe   Right External Iliac Artery -   Minimal Diameter-8.5 x 8.7 mm   Tortuosity-severe   Calcification-mild   Right Common Femoral Artery -   Minimal Diameter-8.9 x 9.4 mm   Tortuosity-mild   Calcification-mild   LEFT PELVIS:   Left Common Iliac Artery -   Minimal Diameter-9.5 x 8.2 mm   Tortuosity-mild   Calcification-moderate   Left External Iliac Artery -   Minimal Diameter-8.3 x 8.0 mm   Tortuosity-moderate   Calcification-mild   Left Common Femoral Artery -   Minimal Diameter-9.0 x 6.8 mm   Tortuosity-mild   Calcification-mild   Review of the MIP images confirms the above findings.   IMPRESSION: 1. Vascular findings and measurements to potential TAVR procedure, as detailed above. 2. Severe thickening calcification of the aortic valve, compatible with reported clinical history of severe aortic stenosis. 3. Mild cardiomegaly. 4. Aortic atherosclerosis, in addition to left main and 3 vessel coronary artery disease. Assessment for potential risk factor modification, dietary therapy or pharmacologic therapy may be warranted, if clinically indicated. 5. Indeterminate lesion in the interpolar region of the right kidney which has grown compared to prior study from 2017. Further characterization with nonemergent abdominal MRI with and without  IV gadolinium is recommended in the near future to exclude potential neoplasm. 6. Additional incidental findings, as above.     Electronically Signed   By: Vinnie Langton M.D.   On: 05/15/2020 06:52    STS RISK CALCULATOR: AVR/CABG: Risk of Mortality: 1.564% Renal Failure: 2.267% Permanent Stroke: 1.547% Prolonged Ventilation: 6.419% DSW Infection: 0.150% Reoperation: 3.960% Morbidity or Mortality: 11.381% Short Length of Stay: 40.788% Long Length of Stay: 5.231%   Impression:  This 75 year old gentleman has stage D, severe, symptomatic aortic stenosis with New York Heart Association class II symptoms of exertional fatigue and shortness of breath consistent with chronic diastolic congestive heart failure.  I have personally reviewed his 2D echo, cardiac catheterization, and CTA studies.  His echo shows a severely calcified and moderately thickened trileaflet aortic valve with restricted mobility.  The mean gradient is 34 mmHg with a peak gradient of 43 mmHg and a valve area of 0.99 cm consistent with severe aortic stenosis.  Left ventricular ejection fraction is low normal at 50 to 55% with moderate concentric LVH and grade 2 diastolic dysfunction.  His cardiac catheterization shows moderate nonobstructive coronary disease with a 70% PDA stenosis and a relatively small branch.  Peak to peak gradient across aortic valve was measured at 43 mmHg with a mean gradient  of 36 mmHg.  I agree that aortic valve replacement is indicated in this patient for relief of his symptoms and to prevent progressive left ventricular dysfunction.  Given his age and comorbid risk factors including stage III chronic kidney disease I think transcatheter aortic valve replacement would be the best treatment option for him.  His gated cardiac CTA shows anatomy suitable for TAVR using a 29 mm SAPIEN 3 valve.  His abdominal and pelvic CTA shows adequate pelvic vascular anatomy to allow transfemoral insertion.   His abdominal CTA did show an indeterminate lesion in the right kidney.  He subsequently had an MRI which showed this to be an exophytic cyst but did not require further follow-up.  The patient and his wife were counseled at length regarding treatment alternatives for management of severe symptomatic aortic stenosis. The risks and benefits of surgical intervention has been discussed in detail. Long-term prognosis with medical therapy was discussed. Alternative approaches such as conventional surgical aortic valve replacement, transcatheter aortic valve replacement, and palliative medical therapy were compared and contrasted at length. This discussion was placed in the context of the patient's own specific clinical presentation and past medical history. All of their questions have been addressed.   Following the decision to proceed with transcatheter aortic valve replacement, a discussion was held regarding what types of management strategies would be attempted intraoperatively in the event of life-threatening complications, including whether or not the patient would be considered a candidate for the use of cardiopulmonary bypass and/or conversion to open sternotomy for attempted surgical intervention.  I think he would be a candidate for emergent sternotomy to manage any intraoperative complications.  The patient is aware of the fact that transient use of cardiopulmonary bypass may be necessary. The patient has been advised of a variety of complications that might develop including but not limited to risks of death, stroke, paravalvular leak, aortic dissection or other major vascular complications, aortic annulus rupture, device embolization, cardiac rupture or perforation, mitral regurgitation, acute myocardial infarction, arrhythmia, heart block or bradycardia requiring permanent pacemaker placement, congestive heart failure, respiratory failure, renal failure, pneumonia, infection, other late complications  related to structural valve deterioration or migration, or other complications that might ultimately cause a temporary or permanent loss of functional independence or other long term morbidity. The patient provides full informed consent for the procedure as described and all questions were answered.   He has had some groin rash over the past few weeks which she has been using Lotrimin cream for with improvement.  He will continue using that over the course of this week and if this does not resolve by early next week he will let us know so he can be started on a course of Diflucan.  Plan:  He will be scheduled for transfemoral transcatheter aortic valve replacement on 08/12/2020.   I spent in excess of 60 minutes during the conduct of this hospital consultation and >50% of this time involved direct face-to-face encounter for counseling and/or coordination of the patient's care for severe aortic stenosis.   Gaye Pollack, MD 07/30/2020 11:29 AM

## 2020-07-30 NOTE — Therapy (Signed)
[address] [place], [place], [address] Phone: [phone]   Fax:  [phone]  Physical Therapy Evaluation  Patient Details  Name: Daryl Webb MRN: [id number] Date of Birth: [birth year] Referring Provider (PT): [name] MD   Encounter Date: 07/30/2020   PT End of Session - 07/30/20 1151     Visit Number 1    Number of Visits 1    Date for PT Re-Evaluation 07/31/20    PT Start Time 1147    PT Stop Time 1218    PT Time Calculation (min) 31 min    Activity Tolerance Patient tolerated treatment well    Behavior During Therapy [facility] for tasks assessed/performed             Past Medical History:  Diagnosis Date   Anemia    Arthritis    Constipation due to pain medication    needs stool softner while on pain medication   Diabetes mellitus    Duodenal ulcer 08/13/2015   2014  - Dr Kaplan/GI   H/O: osteoarthritis    History of prostate cancer    History of urethral stricture    Hypercholesterolemia    Hypertension    Severe aortic stenosis    Ulcer duodenal hemorrhage     Past Surgical History:  Procedure Laterality Date   ARTHROPLASTY  2004   right knee Dr. Wynelle Link   CARPAL TUNNEL RELEASE     right hand,wrist [name]   CYSTOSCOPY N/A 09/20/2012   Procedure: Consuela Mimes;  Surgeon: [name], MD;  Location: [place] [facility];  Service: Urology;  Laterality: N/A;   JOINT REPLACEMENT Right    KNEE ARTHROSCOPY Right    LUMBAR LAMINECTOMY  02/08/2013   PROSTATECTOMY     radical   RIGHT/LEFT HEART CATH AND CORONARY ANGIOGRAPHY N/A 05/08/2020   Procedure: RIGHT/LEFT HEART CATH AND CORONARY ANGIOGRAPHY;  Surgeon: [name], MD;  Location: [place] [facility];  Service: Cardiovascular;  Laterality: N/A;   VASECTOMY  1985    There were no vitals filed for this visit.    Subjective Assessment - 07/30/20 1151     Subjective pt is a 75 y.o M with CC of SOB that occurs mostly with bending  forward starting about a year ago. He notes given the state of things with COVID it has slowed him. He does report some difficulty with balance with getting out of bed.  He denies any other symptoms.    Patient Stated Goals to fix heart    Currently in Pain? Yes                [facility] PT Assessment - 07/30/20 0001       Assessment   Medical Diagnosis Severe Aortic Stenosis    Referring Provider (PT) [name] MD    Hand Dominance Right      Precautions   Precautions None      Restrictions   Weight Bearing Restrictions No      Balance Screen   Has the patient fallen in the past 6 months No    Has the patient had a decrease in activity level because of a fear of falling?  No    Is the patient reluctant to leave their home because of a fear of falling?  No      Home Environment   Living Environment Private residence    Living Arrangements Spouse/significant other    Available Help at Discharge Family  Type of [place] to enter    Entrance Stairs-Number of Steps 5    Entrance Stairs-Rails Can reach both    Home Layout One level    [place] - single point;Walker - 2 wheels;Crutches      Prior Function   Level of Independence Independent with basic ADLs      ROM / Strength   AROM / PROM / Strength AROM;Strength      AROM   Overall AROM  Within functional limits for tasks performed      Strength   Overall Strength Within functional limits for tasks performed    Strength Assessment Site Hand    Right Hand Grip (lbs) 59    Left Hand Grip (lbs) 51      Ambulation/Gait   Ambulation/Gait Yes    Gait Pattern Decreased stride length;Step-through pattern              [facility] Pre-Surgical Assessment - 07/30/20 0001     5 Meter Walk Test- trial 1 4 sec    5 Meter Walk Test- trial 2 4 sec.     5 Meter Walk Test- trial 3 5 sec.    5 meter walk test average 4.33 sec    4 Stage Balance Test tolerated for:  10 sec.    4 Stage  Balance Test Position 4    Sit To Stand Test- trial 1 15 sec.    ADL/IADL Independent with: Bathing;Dressing;Meal prep;Finances;Yard work    6 Minute Walk- Baseline yes    BP (mmHg) 127/80    HR (bpm) 77    02 Sat (%RA) 95 %    Modified Borg Scale for Dyspnea 0- Nothing at all    Perceived Rate of Exertion (Borg) 6-    6 Minute Walk Post Test yes    BP (mmHg) 139/77    HR (bpm) 98    02 Sat (%RA) 95 %    Modified Borg Scale for Dyspnea 3- Moderate shortness of breath or breathing difficulty    Perceived Rate of Exertion (Borg) 13- Somewhat hard    Aerobic Endurance Distance Walked 1087    Endurance additional comments pt is 37.13% limited compared to age related norm                      Objective measurements completed on examination: See above findings.                            Plan - 07/30/20 1221     Clinical Impression Statement see assessment in note    Stability/Clinical Decision Making Stable/Uncomplicated    Clinical Decision Making Low    PT Frequency One time visit    PT Next Visit Plan Pre TAVR evaluation    Consulted and Agree with Plan of Care Patient              Clinical Impression Statement: Pt is a 75 yo M presenting to OP PT for evaluation prior to possible TAVR surgery due to severe aortic stenosis. Pt reports onset of SOB  approximately 12 months ago. Symptoms are limiting endurance. Pt presents with good ROM and strength, good balance and is assessed as low at high fall risk 4 stage balance test, good walking speed and fair aerobic endurance per 6 minute walk test. Pt ambulated 1087 feet in without requesting a seated rest beak lasting.  At end  of testing, patient's HR was 98 bpm and O2 was 95% on room air. Pt reported 3/10 shortness of breath on modified scale for dyspnea. Pt ambulated a total of 1087 feet in 6 minute walk. SOB increased significantly with 6 minute walk test. Based on the Short Physical Performance  B[place], patient has a frailty rating of 10/12 with </= 5/12 considered frail.    Patient demonstrated  the following deficits and impairments:     Visit Diagnosis: Other abnormalities of gait and mobility     Problem List Patient Active Problem List   Diagnosis Date Noted   Severe aortic stenosis    Atherosclerosis of aorta ([place]) 05/19/2020   CKD (chronic kidney disease), stage III ([place]) 07/03/2017   Aortic valve stenosis 02/12/2016   Flank pain 02/12/2016   Mixed hyperlipidemia 02/12/2016   Essential hypertension 02/12/2016   Dyslipidemia (high LDL; low HDL) 01/19/2016   PUD (peptic ulcer disease) 01/19/2016   Encounter for well adult exam with abnormal findings 08/18/2015   Duodenal ulcer 08/13/2015   Vertigo 05/02/2014   Low back pain 07/20/2012   Iron deficiency anemia due to chronic blood loss 07/20/2012   Acute posthemorrhagic anemia 06/27/2012   Melena 06/19/2012   Dyspnea 06/19/2012   Type 2 diabetes mellitus without complication, without long-term current use of insulin ([place]) 09/01/2011   Heart murmur 04/14/2011   Benign hypertensive heart disease without heart failure 08/17/2010   Hypercholesterolemia    History of prostate cancer    History of urethral stricture    Generalized osteoarthritis of multiple sites    Derius Ghosh PT, DPT, LAT, ATC  07/30/20  12:25 PM      [facility] [address] [place], [place], [address] Phone: [phone]   Fax:  [phone]  Name: TAVAUGHN SILGUERO MRN: [id number] Date of Birth: 09-Mar-1945

## 2020-08-01 ENCOUNTER — Other Ambulatory Visit: Payer: Self-pay | Admitting: *Deleted

## 2020-08-01 DIAGNOSIS — N1832 Chronic kidney disease, stage 3b: Secondary | ICD-10-CM | POA: Diagnosis not present

## 2020-08-01 DIAGNOSIS — Z8639 Personal history of other endocrine, nutritional and metabolic disease: Secondary | ICD-10-CM | POA: Diagnosis not present

## 2020-08-01 DIAGNOSIS — Z7984 Long term (current) use of oral hypoglycemic drugs: Secondary | ICD-10-CM | POA: Diagnosis not present

## 2020-08-01 DIAGNOSIS — E1122 Type 2 diabetes mellitus with diabetic chronic kidney disease: Secondary | ICD-10-CM | POA: Diagnosis not present

## 2020-08-01 DIAGNOSIS — B356 Tinea cruris: Secondary | ICD-10-CM | POA: Diagnosis not present

## 2020-08-01 MED ORDER — FLUCONAZOLE 100 MG PO TABS: 100.0000 mg | ORAL_TABLET | Freq: Every day | ORAL | 0 refills | Status: DC

## 2020-08-01 MED ORDER — NYSTATIN 100000 UNIT/GM EX OINT: 1.0000 "application " | TOPICAL_OINTMENT | Freq: Three times a day (TID) | CUTANEOUS | 0 refills | Status: DC

## 2020-08-04 ENCOUNTER — Other Ambulatory Visit: Payer: Self-pay

## 2020-08-04 DIAGNOSIS — I350 Nonrheumatic aortic (valve) stenosis: Secondary | ICD-10-CM

## 2020-08-07 DIAGNOSIS — E538 Deficiency of other specified B group vitamins: Secondary | ICD-10-CM | POA: Diagnosis not present

## 2020-08-08 ENCOUNTER — Ambulatory Visit (HOSPITAL_COMMUNITY)
Admission: RE | Admit: 2020-08-08 | Discharge: 2020-08-08 | Disposition: A | Discharge status: Home / Self Care | Payer: Medicare Other | Source: Ambulatory Visit | Attending: Cardiovascular Disease | Admitting: Cardiovascular Disease

## 2020-08-08 ENCOUNTER — Encounter (HOSPITAL_COMMUNITY): Payer: Self-pay

## 2020-08-08 ENCOUNTER — Encounter (HOSPITAL_COMMUNITY)
Admission: RE | Admit: 2020-08-08 | Discharge: 2020-08-08 | Disposition: A | Discharge status: Home / Self Care | Payer: Medicare Other | Source: Ambulatory Visit | Attending: Cardiovascular Disease | Admitting: Cardiovascular Disease

## 2020-08-08 ENCOUNTER — Other Ambulatory Visit: Payer: Self-pay

## 2020-08-08 DIAGNOSIS — N183 Chronic kidney disease, stage 3 unspecified: Secondary | ICD-10-CM | POA: Insufficient documentation

## 2020-08-08 DIAGNOSIS — I700 Atherosclerosis of aorta: Secondary | ICD-10-CM | POA: Insufficient documentation

## 2020-08-08 DIAGNOSIS — E7800 Pure hypercholesterolemia, unspecified: Secondary | ICD-10-CM | POA: Insufficient documentation

## 2020-08-08 DIAGNOSIS — Z79899 Other long term (current) drug therapy: Secondary | ICD-10-CM | POA: Insufficient documentation

## 2020-08-08 DIAGNOSIS — I129 Hypertensive chronic kidney disease with stage 1 through stage 4 chronic kidney disease, or unspecified chronic kidney disease: Secondary | ICD-10-CM | POA: Insufficient documentation

## 2020-08-08 DIAGNOSIS — Z7901 Long term (current) use of anticoagulants: Secondary | ICD-10-CM | POA: Insufficient documentation

## 2020-08-08 DIAGNOSIS — Z20822 Contact with and (suspected) exposure to covid-19: Secondary | ICD-10-CM | POA: Diagnosis not present

## 2020-08-08 DIAGNOSIS — Z01818 Encounter for other preprocedural examination: Secondary | ICD-10-CM | POA: Diagnosis not present

## 2020-08-08 DIAGNOSIS — Z7982 Long term (current) use of aspirin: Secondary | ICD-10-CM | POA: Insufficient documentation

## 2020-08-08 DIAGNOSIS — I350 Nonrheumatic aortic (valve) stenosis: Secondary | ICD-10-CM | POA: Diagnosis not present

## 2020-08-08 DIAGNOSIS — E1122 Type 2 diabetes mellitus with diabetic chronic kidney disease: Secondary | ICD-10-CM | POA: Diagnosis not present

## 2020-08-08 DIAGNOSIS — N1831 Chronic kidney disease, stage 3a: Secondary | ICD-10-CM | POA: Diagnosis not present

## 2020-08-08 DIAGNOSIS — Z87891 Personal history of nicotine dependence: Secondary | ICD-10-CM | POA: Insufficient documentation

## 2020-08-08 DIAGNOSIS — J90 Pleural effusion, not elsewhere classified: Secondary | ICD-10-CM | POA: Diagnosis not present

## 2020-08-08 DIAGNOSIS — Z7984 Long term (current) use of oral hypoglycemic drugs: Secondary | ICD-10-CM | POA: Diagnosis not present

## 2020-08-08 HISTORY — DX: Spinal stenosis, site unspecified: M48.00

## 2020-08-08 HISTORY — DX: Cardiac murmur, unspecified: R01.1

## 2020-08-08 HISTORY — DX: Chronic kidney disease, unspecified: N18.9

## 2020-08-08 HISTORY — DX: Personal history of urinary calculi: Z87.442

## 2020-08-08 LAB — COMPREHENSIVE METABOLIC PANEL
ALT: 24 U/L (ref 0–44)
AST: 21 U/L (ref 15–41)
Albumin: 3.8 g/dL (ref 3.5–5.0)
Alkaline Phosphatase: 64 U/L (ref 38–126)
Anion gap: 11 (ref 5–15)
BUN: 23 mg/dL (ref 8–23)
CO2: 19 mmol/L — ABNORMAL LOW (ref 22–32)
Calcium: 9.1 mg/dL (ref 8.9–10.3)
Chloride: 107 mmol/L (ref 98–111)
Creatinine, Ser: 1.56 mg/dL — ABNORMAL HIGH (ref 0.61–1.24)
GFR, Estimated: 46 mL/min — ABNORMAL LOW (ref >60)
Glucose, Bld: 126 mg/dL — ABNORMAL HIGH (ref 70–99)
Potassium: 4.4 mmol/L (ref 3.5–5.1)
Sodium: 137 mmol/L (ref 135–145)
Total Bilirubin: 0.9 mg/dL (ref 0.3–1.2)
Total Protein: 6.9 g/dL (ref 6.5–8.1)

## 2020-08-08 LAB — BLOOD GAS, ARTERIAL
Acid-base deficit: 2.5 mmol/L — ABNORMAL HIGH (ref 0.0–2.0)
Bicarbonate: 21.6 mmol/L (ref 20.0–28.0)
Drawn by: 58793
FIO2: 21
O2 Saturation: 97.6 %
Patient temperature: 37
pCO2 arterial: 36.6 mmHg (ref 32.0–48.0)
pH, Arterial: 7.389 (ref 7.350–7.450)
pO2, Arterial: 102 mmHg (ref 83.0–108.0)

## 2020-08-08 LAB — URINALYSIS, ROUTINE W REFLEX MICROSCOPIC
Bacteria, UA: NONE SEEN
Bilirubin Urine: NEGATIVE
Glucose, UA: 50 mg/dL — AB
Hgb urine dipstick: NEGATIVE
Ketones, ur: 5 mg/dL — AB
Leukocytes,Ua: NEGATIVE
Nitrite: NEGATIVE
Protein, ur: 100 mg/dL — AB
Specific Gravity, Urine: 1.019 (ref 1.005–1.030)
pH: 5 (ref 5.0–8.0)

## 2020-08-08 LAB — TYPE AND SCREEN
ABO/RH(D): O POS
Antibody Screen: NEGATIVE

## 2020-08-08 LAB — CBC
HCT: 43.6 % (ref 39.0–52.0)
Hemoglobin: 14.2 g/dL (ref 13.0–17.0)
MCH: 33.5 pg (ref 26.0–34.0)
MCHC: 32.6 g/dL (ref 30.0–36.0)
MCV: 102.8 fL — ABNORMAL HIGH (ref 80.0–100.0)
Platelets: 215 10*3/uL (ref 150–400)
RBC: 4.24 MIL/uL (ref 4.22–5.81)
RDW: 13.8 % (ref 11.5–15.5)
WBC: 7.9 10*3/uL (ref 4.0–10.5)
nRBC: 0 % (ref 0.0–0.2)

## 2020-08-08 LAB — PROTIME-INR
INR: 1 (ref 0.8–1.2)
Prothrombin Time: 13.6 seconds (ref 11.4–15.2)

## 2020-08-08 LAB — SARS CORONAVIRUS 2 (TAT 6-24 HRS): SARS Coronavirus 2: NEGATIVE

## 2020-08-08 LAB — SURGICAL PCR SCREEN
MRSA, PCR: NEGATIVE
Staphylococcus aureus: POSITIVE — AB

## 2020-08-08 LAB — GLUCOSE, CAPILLARY: Glucose-Capillary: 189 mg/dL — ABNORMAL HIGH (ref 70–99)

## 2020-08-08 MED ORDER — NOREPINEPHRINE 4 MG/250ML-% IV SOLN
0.0000 ug/min | INTRAVENOUS | Status: AC
  Administered 2020-08-12: 1 ug/min via INTRAVENOUS
  Filled 2020-08-08: qty 250

## 2020-08-08 MED ORDER — POTASSIUM CHLORIDE 2 MEQ/ML IV SOLN
80.0000 meq | INTRAVENOUS | Status: DC
  Filled 2020-08-08: qty 40

## 2020-08-08 MED ORDER — CEFAZOLIN SODIUM-DEXTROSE 2-4 GM/100ML-% IV SOLN
2.0000 g | INTRAVENOUS | Status: AC
  Administered 2020-08-12: 2 g via INTRAVENOUS
  Filled 2020-08-08 (×4): qty 100

## 2020-08-08 MED ORDER — MAGNESIUM SULFATE 50 % IJ SOLN
40.0000 meq | INTRAMUSCULAR | Status: DC
  Filled 2020-08-08: qty 9.85

## 2020-08-08 MED ORDER — SODIUM CHLORIDE 0.9 % IV SOLN
INTRAVENOUS | Status: DC
  Filled 2020-08-08: qty 30

## 2020-08-08 MED ORDER — DEXMEDETOMIDINE HCL IN NACL 400 MCG/100ML IV SOLN
0.1000 ug/kg/h | INTRAVENOUS | Status: AC
  Administered 2020-08-12: 6 ug/kg/h via INTRAVENOUS
  Filled 2020-08-08 (×2): qty 100

## 2020-08-08 NOTE — Progress Notes (Signed)
Anesthesia Chart Review:  Case: [id number] Date/Time: 08/12/20 1200   Procedures:      TRANSCATHETER AORTIC VALVE REPLACEMENT, TRANSFEMORAL     TRANSESOPHAGEAL ECHOCARDIOGRAM (TEE)   Anesthesia type: Monitor Anesthesia Care   Pre-op diagnosis: Severe Aortic Stenosis   Location: [facility] CATH LAB 6 / [place] IN[facility]ASIVE CV [facility]   Providers: [name], MD      CT Surgeon: Gilford Raid, MD  DISCUSSION: Patient is a 74 year old male scheduled for the above procedure.  History includes former smoker (quit 02/09/80), severe aortic stenosis, dyspnea, HTN, DM2, CKD (stage III), hypercholesterolemia, anemia, prostate cancer (s/p prostatectomy 10/14/99), urethral stricture (s/p laser 03/22/05 & 05/22/16, balloon dilation 04/25/07, required foley catheter placement after urethral balloon dilation by urology for 09/20/12 lumbar fusion), back surgery (L3-5 PLIF 09/20/12).  08/08/20 presurgical COVID-19 test, A1c, and CXR are in process. Anesthesia team to evaluate on the day of surgery.    VS: BP 134/77   Pulse 82   Temp 36.7 C (Oral)   Resp 18   Ht 5\' 7"  (1.702 m)   Wt 76.2 kg   SpO2 99%   BMI 26.31 kg/m    PROVIDERS: [name], [name], MD is PCP  Lyman Bishop, MD is cardiologist [name], MD is endocrinologist   LABS: Labs reviewed: Acceptable for surgery. Cr 1.56 is consistent with labs from March and October. A1c in process.  (all labs ordered are listed, but only abnormal results are displayed)  Labs Reviewed  GLUCOSE, CAPILLARY - Abnormal; Notable for the following components:      Result Value   Glucose-Capillary 189 (*)    All other components within normal limits  CBC - Abnormal; Notable for the following components:   MCV 102.8 (*)    All other components within normal limits  COMPREHENSIVE METABOLIC PANEL - Abnormal; Notable for the following components:   CO2 19 (*)    Glucose, Bld 126 (*)    Creatinine, Ser 1.56 (*)    GFR, Estimated 46 (*)    All other components within normal  limits  URINALYSIS, ROUTINE W REFLEX MICROSCOPIC - Abnormal; Notable for the following components:   Glucose, UA 50 (*)    Ketones, ur 5 (*)    Protein, ur 100 (*)    All other components within normal limits  BLOOD GAS, ARTERIAL - Abnormal; Notable for the following components:   Acid-base deficit 2.5 (*)    All other components within normal limits  SURGICAL PCR SCREEN  SARS CORONAVIRUS 2 (TAT 6-24 HRS)  PROTIME-INR  HEMOGLOBIN A1C  TYPE AND SCREEN     IMAGES: CXR 08/08/20: In progress.   MRI Abd 06/06/20: IMPRESSION: - There is a fluid signal exophytic cyst of the lateral midportion of the right kidney measuring 2.1 cm. No suspicious mass or contrast enhancement. No further follow-up or characterization is required for this benign cyst. - Aortic Atherosclerosis (ICD10-I70.0).  CTA chest/abd/pelvis 05/14/20: IMPRESSION: 1. Vascular findings and measurements to potential TAVR procedure, as detailed above. 2. Severe thickening calcification of the aortic valve, compatible with reported clinical history of severe aortic stenosis. 3. Mild cardiomegaly. 4. Aortic atherosclerosis, in addition to left main and 3 vessel coronary artery disease. Assessment for potential risk factor modification, dietary therapy or pharmacologic therapy may be warranted, if clinically indicated. 5. Indeterminate lesion in the interpolar region of the right kidney which has grown compared to prior study from 2017. Further characterization with nonemergent abdominal MRI with and without IV gadolinium is recommended  in the near future to exclude potential neoplasm. 6. Additional incidental findings, as above.    EKG: 08/08/20: Normal sinus rhythm Left axis deviation Left ventricular hypertrophy with repolarization abnormality ( R in aVL , Cornell product ) Abnormal ECG   CV: Carotid US 05/14/20: Summary:  - Right Carotid: Velocities in the right ICA are consistent with a 1-39%  stenosis.  -  Left Carotid: Velocities in the left ICA are consistent with a 1-39%  stenosis.  - Vertebrals:  Bilateral vertebral arteries demonstrate antegrade flow.  - Subclavians: Normal flow hemodynamics were seen in bilateral subclavian arteries.   CT Coronary 05/14/20: IMPRESSION: 1. Trileaflet aortic valve with severe calcifications (AV calcium score 3435) 2. Aortic annulus measures 73mm x 61mm in diameter with perimeter 90mm and area 654 mm^2. No annular or LVOT calcifications. Annular measurements suitable for delivery of 52mm Edwards-Sapien 3 valve. 3. Sufficient coronary to annulus distance, measuring 82mm to left main and 59mm to RCA 4. Optimum Fluoroscopic Angle for Delivery: LAO 2 CAU 8 5. Coronary calcium score 807 (74th percentile)   RHC/LHC 05/08/20: 1.  Widely patent left main 2.  Widely patent LAD with ostial stenosis of a small second diagonal branch, otherwise no significant obstruction in the LAD territory 3.  Patent left circumflex with mild to moderate nonobstructive plaquing in the mid vessel 4.  Patent RCA with mild nonobstructive stenosis, except for a 70% PDA stenosis in a relatively small caliber PDA branch 5.  Calcific aortic valve with restricted leaflet mobility on plain fluoroscopy.  Peak to peak transaortic gradient 43 mmHg, mean gradient 36 mmHg, calculated aortic valve area 1.17 cm Recommendations: Patient with exertional symptoms, progressive aortic stenosis in the moderately severe range.  Will review with multidisciplinary heart team, arrange structural heart consultation, arrange CT angiography studies, and consider options of continued close surveillance versus moving forward with aortic valve replacement.  Recommend medical therapy for coronary artery disease.   Echo 03/26/20: IMPRESSIONS   1. Left ventricular ejection fraction, by estimation, is 50 to 55%. The  left ventricle has low normal function. The left ventricle demonstrates  regional wall motion  abnormalities (see scoring diagram/findings for  description). There is moderate  concentric left ventricular hypertrophy. Left ventricular diastolic  parameters are consistent with Grade II diastolic dysfunction  (pseudonormalization). Elevated left ventricular end-diastolic pressure.  There is moderate hypokinesis of the left  ventricular, basal inferoseptal wall.   2. Right ventricular systolic function is normal. The right ventricular  size is normal.   3. Left atrial size was mild to moderately dilated.   4. The mitral valve is normal in structure. Trivial mitral valve  regurgitation. No evidence of mitral stenosis.   5. The aortic valve is calcified. There is severe calcifcation of the  aortic valve. There is moderate thickening of the aortic valve. Aortic  valve regurgitation is mild. Severe aortic valve stenosis. Aortic valve  area, by VTI measures 0.99 cm. Aortic  valve mean gradient measures 34.0 mmHg. Aortic valve Vmax measures 4.00  m/s.  - Comparison(s): Changes from prior study are noted.  - Conclusion(s)/Recommendation(s): Since last study, AS is severe. Peak  velocity is 4 m/s, AVA is 0.99.    Past Medical History:  Diagnosis Date   Anemia    Arthritis    Cancer ([place])    prostate   Constipation due to pain medication    needs stool softner while on pain medication   Diabetes mellitus    Duodenal ulcer 08/13/2015  2014  - Dr Kaplan/GI   Dyspnea    H/O: osteoarthritis    Heart murmur    History of kidney stones    History of prostate cancer    History of urethral stricture    Hypercholesterolemia    Hypertension    Severe aortic stenosis    Spinal stenosis    Ulcer duodenal hemorrhage     Past Surgical History:  Procedure Laterality Date   ARTHROPLASTY  2004   right knee Dr. Wynelle Link   BACK SURGERY     CARPAL TUNNEL RELEASE     right hand,wrist [name]   CYSTOSCOPY N/A 09/20/2012   Procedure: Consuela Mimes;  Surgeon: [name], MD;  Location: [facility]  NEURO ORS;  Service: Urology;  Laterality: N/A;   JOINT REPLACEMENT Right    KNEE ARTHROSCOPY Right    LUMBAR LAMINECTOMY  02/08/2013   PROSTATECTOMY     radical   RIGHT/LEFT HEART CATH AND CORONARY ANGIOGRAPHY N/A 05/08/2020   Procedure: RIGHT/LEFT HEART CATH AND CORONARY ANGIOGRAPHY;  Surgeon: [name], MD;  Location: [facility];  Service: Cardiovascular;  Laterality: N/A;   VASECTOMY  1985    MEDICATIONS:  ACCU-CHEK SMARTVIEW test strip   amLODipine (NORVASC) 5 MG tablet   amoxicillin (AMOXIL) 500 MG capsule   Ascorbic Acid (VITAMIN C) 1000 MG tablet   aspirin 81 MG tablet   atorvastatin (LIPITOR) 40 MG tablet   BYDUREON BCISE 2 MG/0.85ML AUIJ   Cholecalciferol 2000 UNITS CAPS   clotrimazole (LOTRIMIN AF JOCK ITCH) 1 % cream   Coenzyme Q10 (COQ10) 100 MG CAPS   [employer] CINNAMON PO   cyanocobalamin (,VITAMIN B-12,) 1000 MCG/ML injection   dapagliflozin propanediol (FARXIGA) 5 MG TABS tablet   diclofenac (VOLTAREN) 75 MG EC tablet   fluconazole (DIFLUCAN) 100 MG tablet   loratadine (CLARITIN) 10 MG tablet   losartan-hydrochlorothiazide (HYZAAR) 50-12.5 MG tablet   metFORMIN (GLUCOPHAGE) 500 MG tablet   metoprolol succinate (TOPROL-XL) 50 MG 24 hr tablet   nystatin ointment (MYCOSTATIN)   OLIVE LEAF EXTRACT PO   Omega-3 Fatty Acids (FISH OIL) 1000 MG CAPS   QUERCETIN PO   No current facility-administered medications for this encounter.    [START ON 08/12/2020] ceFAZolin (ANCEF) IVPB 2g/100 mL premix   [START ON 08/12/2020] dexmedetomidine (PRECEDEX) 400 MCG/100ML (4 mcg/mL) infusion   [START ON 08/12/2020] heparin 30,000 units/NS 1000 mL solution for CELLSAVER   [START ON 08/12/2020] magnesium sulfate (IV Push/IM) injection 40 mEq   [START ON 08/12/2020] norepinephrine (LEVOPHED) 4mg  in 278mL premix infusion   [START ON 08/12/2020] potassium chloride injection 80 mEq    Myra Gianotti, PA-C Surgical Short Stay/Anesthesiology [facility] Phone [phone] [facility] Phone [phone] 08/08/2020 1:48 PM

## 2020-08-08 NOTE — Progress Notes (Signed)
PCP - Betty [name], MD Cardiologist - Lyman Bishop, MD Endoscrinologist- [name], MD @ [facility]; records requested  PPM/ICD - Denies  Chest x-ray - 08/08/20 EKG - 08/08/20 Stress Test - 04/18/19 ECHO - 03/26/20 Cardiac Cath - 05/08/20  Sleep Study - Denies   Patient is diabetic, does not check blood sugar. A1C obtained.  Blood Thinner Instructions: N/A Aspirin Instructions: Continue until day before sx.  ERAS Protcol - N/A PRE-SURGERY Ensure or G2- N/A  COVID TEST- 08/08/20   Anesthesia review: Yes, cardiac hx.  Patient denies shortness of breath, fever, cough and chest pain at PAT appointment   All instructions explained to the patient, with a verbal understanding of the material. Patient agrees to go over the instructions while at home for a better understanding. Patient also instructed to self quarantine after being tested for COVID-19. The opportunity to ask questions was provided.

## 2020-08-08 NOTE — Pre-Procedure Instructions (Signed)
Surgical Instructions:    Your procedure is scheduled on Tuesday, July 5th (12:15 PM- 2:15 PM).  Report to [facility] Main Entrance "A" at 10:15 A.M., then check in with the Admitting office.  Call this number if you have any questions prior to your surgery date, or have problems the morning of surgery:  [phone]    Remember:  Do not eat or drink after midnight the night before your surgery.    Stop taking Metformin on 7/3 (Sunday). You will take your last dose on 7/2 (Saturday).  Do not take dapagliflozin propanediol (FARXIGA) 7/4 or 7/5. Continue taking all other medications without change through the day before surgery.   On the morning of surgery do not take any medications.   Follow your surgeon's instructions on when to stop Aspirin.  If no instructions were given by your surgeon then you will need to call the office to get those instructions.     As of today, STOP taking any NSAIDs such as diclofenac (VOLTAREN), Aleve, Naproxen, Ibuprofen, Motrin, Advil, Goody's, BC's, all herbal medications, fish oil, and all vitamins.             HOW TO MANAGE YOUR DIABETES BEFORE AND AFTER SURGERY  Why is it important to control my blood sugar before and after surgery? Improving blood sugar levels before and after surgery helps healing and can limit problems. A way of improving blood sugar control is eating a healthy diet by:  Eating less sugar and carbohydrates  Increasing activity/exercise  Talking with your doctor about reaching your blood sugar goals High blood sugars (greater than 180 mg/dL) can raise your risk of infections and slow your recovery, so you will need to focus on controlling your diabetes during the weeks before surgery. Make sure that the doctor who takes care of your diabetes knows about your planned surgery including the date and location.  How do I manage my blood sugar before surgery? Check your blood sugar at least 4 times a day, starting 2 days before  surgery, to make sure that the level is not too high or low.  Check your blood sugar the morning of your surgery when you wake up and every 2 hours until you get to the Short Stay unit.  If your blood sugar is less than 70 mg/dL, you will need to treat for low blood sugar: Do not take insulin. Treat a low blood sugar (less than 70 mg/dL) with  cup of clear juice (cranberry or apple), 4 glucose tablets, OR glucose gel. Recheck blood sugar in 15 minutes after treatment (to make sure it is greater than 70 mg/dL). If your blood sugar is not greater than 70 mg/dL on recheck, call [phone] for further instructions. Report your blood sugar to the short stay nurse when you get to Short Stay.  If you are admitted to the hospital after surgery: Your blood sugar will be checked by the staff and you will probably be given insulin after surgery (instead of oral diabetes medicines) to make sure you have good blood sugar levels. The goal for blood sugar control after surgery is 80-180 mg/dL.     Special instructions:    [facility]- Preparing For Surgery  Before surgery, you can play an important role. Because skin is not sterile, your skin needs to be as free of germs as possible. You can reduce the number of germs on your skin by washing with CHG (chlorahexidine gluconate) Soap before surgery.  CHG is an antiseptic  cleaner which kills germs and bonds with the skin to continue killing germs even after washing.     Please do not use if you have an allergy to CHG or antibacterial soaps. If your skin becomes reddened/irritated stop using the CHG.  Do not shave (including legs and underarms) for at least 48 hours prior to first CHG shower. It is OK to shave your face.  Please follow these instructions carefully.     Shower the NIGHT BEFORE SURGERY and the MORNING OF SURGERY with CHG Soap.   If you chose to wash your hair, wash your hair first as usual with your normal shampoo. After you shampoo,  rinse your hair and body thoroughly to remove the shampoo.  Then [employer] and genitals (private parts) with your normal soap and rinse thoroughly to remove soap.  After that Use CHG Soap as you would any other liquid soap. You can apply CHG directly to the skin and wash gently with a scrungie or a clean washcloth.   Apply the CHG Soap to your body ONLY FROM THE NECK DOWN.  Do not use on open wounds or open sores. Avoid contact with your eyes, ears, mouth and genitals (private parts). Wash Face and genitals (private parts)  with your normal soap.   Wash thoroughly, paying special attention to the area where your surgery will be performed.  Thoroughly rinse your body with warm water from the neck down.  DO NOT shower/wash with your normal soap after using and rinsing off the CHG Soap.  Pat yourself dry with a CLEAN TOWEL.  Wear CLEAN PAJAMAS to bed the night before surgery  Place CLEAN SHEETS on your bed the night before your surgery  DO NOT SLEEP WITH PETS.   Day of Surgery:  Take a shower with CHG soap. Wear Clean/Comfortable clothing the morning of surgery Do not wear lotions, powders, colognes, or deodorant.   Remember to brush your teeth WITH YOUR REGULAR TOOTHPASTE. Do not wear jewelry. Men may shave face and neck. Do not bring valuables to the hospital. [facility] is not responsible for any belongings or valuables.  Do NOT Smoke (Tobacco/Vaping) or drink Alcohol 24 hours prior to your procedure.  If you use a CPAP at night, you may bring all equipment for your overnight stay.   Contacts, glasses, dentures or bridgework may not be worn into surgery, please bring cases for these belongings.   For patients admitted to the hospital, discharge time will be determined by your treatment team.  Patients discharged the day of surgery will not be allowed to drive home, and someone needs to stay with them for 24 hours.  ONLY 1 SUPPORT PERSON MAY BE PRESENT WHILE YOU ARE IN  SURGERY. IF YOU ARE TO BE ADMITTED ONCE YOU ARE IN YOUR ROOM YOU WILL BE ALLOWED TWO (2) VISITORS.  Minor children may have two parents present. Special consideration for safety and communication needs will be reviewed on a case by case basis.     Please read over the following fact sheets that you were given.

## 2020-08-08 NOTE — Anesthesia Preprocedure Evaluation (Addendum)
Anesthesia Evaluation  Patient identified by MRN, date of birth, ID band Patient awake    Reviewed: Allergy & Precautions, NPO status , Patient's Chart, lab work & pertinent test results  History of Anesthesia Complications Negative for: history of anesthetic complications  Airway Mallampati: II  TM Distance: >3 FB Neck ROM: Full    Dental  (+) Dental Advisory Given   Pulmonary former smoker,    breath sounds clear to auscultation       Cardiovascular hypertension,  Rhythm:Regular + Systolic murmurs    Neuro/Psych    GI/Hepatic   Endo/Other  diabetes  Renal/GU      Musculoskeletal   Abdominal   Peds  Hematology   Anesthesia Other Findings   Reproductive/Obstetrics                            Anesthesia Physical Anesthesia Plan  ASA: 4  Anesthesia Plan: MAC   Post-op Pain Management:    Induction: Intravenous  PONV Risk Score and Plan: 1 and Propofol infusion and Treatment may vary due to age or medical condition  Airway Management Planned: Nasal Cannula  Additional Equipment: Arterial line  Intra-op Plan:   Post-operative Plan:   Informed Consent: I have reviewed the patients History and Physical, chart, labs and discussed the procedure including the risks, benefits and alternatives for the proposed anesthesia with the patient or authorized representative who has indicated his/her understanding and acceptance.     Dental advisory given  Plan Discussed with: CRNA  Anesthesia Plan Comments: (PAT note written 08/08/2020 by Myra Gianotti, PA-C. )       Anesthesia Quick Evaluation

## 2020-08-09 ENCOUNTER — Other Ambulatory Visit: Payer: Self-pay | Admitting: Internal Medicine

## 2020-08-09 LAB — HEMOGLOBIN A1C
Hgb A1c MFr Bld: 7 % — ABNORMAL HIGH (ref 4.8–5.6)
Mean Plasma Glucose: 154 mg/dL

## 2020-08-11 NOTE — H&P (Signed)
[address]       [place] [address]             [phone]      Cardiothoracic Surgery Admission History and Physical   PCP is [name], [name], MD  Referring Provider is [name], MD  Primary Cardiologist is [name], MD   Reason for admission: Severe aortic stenosis.   HPI:  The patient is a 75 year old gentleman with a history of hypertension, hyperlipidemia, diabetes, stage III chronic kidney disease and aortic stenosis who has been followed by [name]. Previous echocardiograms have shown moderate aortic stenosis. His most recent echocardiogram on 03/26/2020 showed a severely calcified aortic valve with a mean gradient of 34 mmHg and a peak gradient of 64 mmHg. Aortic valve area was 0.99 cm by VTI. His previous echo in January 2021 it showed a mean gradient of 28 mmHg. His left ventricular ejection fraction is low normal at 50 to 55%. There is moderate concentric LVH with grade 2 diastolic dysfunction. He reports not being very active over the past 2 to 3 years due to [place]. He has shortness of breath with moderate exertion such as walking up a flight of stairs or up hills. He reports exertional fatigue. He has had some dizziness when getting up out of bed in the middle the night to go to the bathroom. He denies any chest pain or pressure. He has had no peripheral edema.  He is retired and lives at home with his wife. They enjoy traveling in an RV.      Past Medical History:  Diagnosis Date   Anemia    Arthritis    Constipation due to pain medication    needs stool softner while on pain medication   Diabetes mellitus    Duodenal ulcer 08/13/2015   2014 - Dr Kaplan/GI   H/O: osteoarthritis    History of prostate cancer    History of urethral stricture    Hypercholesterolemia    Hypertension    Severe aortic stenosis    Ulcer duodenal hemorrhage         Past Surgical History:  Procedure Laterality Date   ARTHROPLASTY  2004   right knee  Dr. Wynelle Link   CARPAL TUNNEL RELEASE     right hand,wrist [name]   CYSTOSCOPY N/A 09/20/2012   Procedure: Consuela Mimes; Surgeon: [name], MD; Location: [facility]; Service: Urology; Laterality: N/A;   JOINT REPLACEMENT Right    KNEE ARTHROSCOPY Right    LUMBAR LAMINECTOMY  02/08/2013   PROSTATECTOMY     radical   RIGHT/LEFT HEART CATH AND CORONARY ANGIOGRAPHY N/A 05/08/2020   Procedure: RIGHT/LEFT HEART CATH AND CORONARY ANGIOGRAPHY; Surgeon: [name], MD; Location: [facility]; Service: Cardiovascular; Laterality: N/A;   VASECTOMY  1985        Family History  Problem Relation Age of Onset   Heart disease Mother    Heart failure Mother    Heart disease Father    Diabetes Father    Social History        Socioeconomic History   Marital status: Married    Spouse name: Not on file   Number of children: 2   Years of education: Not on file   Highest education level: Bachelor's degree (e.g., BA, AB, BS)  Occupational History   Occupation: Retired  Tobacco Use   Smoking status: Former    Packs/day: 1.50    Years: 20.00  Pack years: 30.00    Types: Cigarettes    Quit date: 02/09/1980    Years since quitting: 40.4   Smokeless tobacco: Never  Vaping Use   Vaping Use: Never used  Substance and Sexual Activity   Alcohol use: No   Drug use: No   Sexual activity: Not Currently  Other Topics Concern   Not on file  Social History Narrative   Retired from [employer] ([employer]).   Lives with wife,    two children in [place].   Enjoys reading, boating, travelling to [place] in the winter with wife.   Social Determinants of Health      Financial Resource Strain: Not on file  Food Insecurity: Not on file  Transportation Needs: No Transportation Needs   Lack of Transportation (Medical): No   Lack of Transportation (Non-Medical): No  Physical Activity: Inactive   Days of Exercise per Week: 0 days   Minutes of Exercise per Session: 0 min  Stress: Not on  file  Social Connections: Not on file  Intimate Partner Violence: Not on file          Prior to Admission medications   Medication Sig Start Date End Date Taking? Authorizing Provider  ACCU-CHEK SMARTVIEW test strip Use to test blood sugars once daily 07/19/19  Yes [provider]  amLODipine (NORVASC) 5 MG tablet TAKE 1 TABLET DAILY  Patient taking differently: Take 5 mg by mouth daily. 01/30/20  Yes Hilty, Nadean Corwin, MD  amoxicillin (AMOXIL) 500 MG capsule Take 2,000 mg by mouth See admin instructions. Dental procedures 06/15/17  Yes [provider]  aspirin 81 MG tablet Take 81 mg by mouth daily.   Yes [provider]  atorvastatin (LIPITOR) 40 MG tablet TAKE 1 TABLET DAILY  Patient taking differently: 40 mg daily. 01/30/20  Yes Hilty, Nadean Corwin, MD  BYDUREON BCISE 2 MG/0.85ML AUIJ Inject 2 mg into the skin once a week. Thursday 10/18/19  Yes [provider]  Cholecalciferol 2000 UNITS CAPS Take 2,000 Units by mouth 2 (two) times a week. VITAMIN D 3   Yes [provider]  Coenzyme Q10 (COQ10) 100 MG CAPS Take 100 mg by mouth 2 (two) times a week.   Yes [provider]  cyanocobalamin (,VITAMIN B-12,) 1000 MCG/ML injection Inject 1,000 mcg into the muscle every 30 (thirty) days.   Yes [provider]  dapagliflozin propanediol (FARXIGA) 5 MG TABS tablet Take 5 mg by mouth daily.   Yes [provider]  diclofenac (VOLTAREN) 75 MG EC tablet TAKE 1 TABLET DAILY AS NEEDED  Patient taking differently: Take 75 mg by mouth daily. 12/28/19  Yes [name], [name], MD  loratadine (CLARITIN) 10 MG tablet Take 5 mg by mouth daily as needed for allergies.   Yes [provider]  losartan-hydrochlorothiazide (HYZAAR) 50-12.5 MG tablet TAKE 1 TABLET DAILY  Patient taking differently: Take 1 tablet by mouth daily. 07/30/19  Yes Hilty, Nadean Corwin, MD  metFORMIN (GLUCOPHAGE) 500 MG tablet Take 500 mg by mouth 2 (two) times daily. 05/01/19   Yes [provider]  metoprolol succinate (TOPROL-XL) 50 MG 24 hr tablet TAKE 1 TABLET DAILY 07/21/20  Yes Hilty, Nadean Corwin, MD  Omega-3 Fatty Acids (FISH OIL) 1000 MG CAPS Take 1,000 mg by mouth 2 (two) times a week.   Yes [provider]         Current Outpatient Medications  Medication Sig Dispense Refill   ACCU-CHEK SMARTVIEW test strip Use to test  blood sugars once daily     amLODipine (NORVASC) 5 MG tablet TAKE 1 TABLET DAILY (Patient taking differently: Take 5 mg by mouth daily.) 90 tablet 1   amoxicillin (AMOXIL) 500 MG capsule Take 2,000 mg by mouth See admin instructions. Dental procedures  2   aspirin 81 MG tablet Take 81 mg by mouth daily.     atorvastatin (LIPITOR) 40 MG tablet TAKE 1 TABLET DAILY (Patient taking differently: 40 mg daily.) 90 tablet 1   BYDUREON BCISE 2 MG/0.85ML AUIJ Inject 2 mg into the skin once a week. Thursday     Cholecalciferol 2000 UNITS CAPS Take 2,000 Units by mouth 2 (two) times a week. VITAMIN D 3     Coenzyme Q10 (COQ10) 100 MG CAPS Take 100 mg by mouth 2 (two) times a week.     cyanocobalamin (,VITAMIN B-12,) 1000 MCG/ML injection Inject 1,000 mcg into the muscle every 30 (thirty) days.     dapagliflozin propanediol (FARXIGA) 5 MG TABS tablet Take 5 mg by mouth daily.     diclofenac (VOLTAREN) 75 MG EC tablet TAKE 1 TABLET DAILY AS NEEDED (Patient taking differently: Take 75 mg by mouth daily.) 90 tablet 2   loratadine (CLARITIN) 10 MG tablet Take 5 mg by mouth daily as needed for allergies.     losartan-hydrochlorothiazide (HYZAAR) 50-12.5 MG tablet TAKE 1 TABLET DAILY (Patient taking differently: Take 1 tablet by mouth daily.) 90 tablet 3   metFORMIN (GLUCOPHAGE) 500 MG tablet Take 500 mg by mouth 2 (two) times daily.     metoprolol succinate (TOPROL-XL) 50 MG 24 hr tablet TAKE 1 TABLET DAILY 90 tablet 2   Omega-3 Fatty Acids (FISH OIL) 1000 MG CAPS Take 1,000 mg by mouth 2 (two) times a week.     No current facility-administered  medications for this visit.   No Known Allergies  Review of Systems:  General: normal appetite, + decreased energy, no weight gain, no weight loss, no fever  Cardiac: no chest pain with exertion, no chest pain at rest, +SOB with moderate exertion, no resting SOB, no PND, no orthopnea, no palpitations, no arrhythmia, no atrial fibrillation, no LE edema, + dizzy spells, no syncope  Respiratory: + exertional shortness of breath, no home oxygen, no productive cough, no dry cough, no bronchitis, no wheezing, no hemoptysis, no asthma, no pain with inspiration or cough, no sleep apnea, no CPAP at night  GI: no difficulty swallowing, no reflux, no frequent heartburn, no hiatal hernia, no abdominal pain, no constipation, no diarrhea, no hematochezia, no hematemesis, no melena  GU: no dysuria, no frequency, no urinary tract infection, no hematuria, no enlarged prostate, + remote kidney stones, + chronic kidney disease  Vascular: no pain suggestive of claudication, no pain in feet, no leg cramps, no varicose veins, no DVT, no non-healing foot ulcer  Neuro: no stroke, no TIA's, no seizures, no headaches, no temporary blindness one eye, no slurred speech, no peripheral neuropathy, no chronic pain, no instability of gait, no memory/cognitive dysfunction  Musculoskeletal: + arthritis , no joint swelling, no myalgias, no difficulty walking, normal mobility  Skin: + rash ( jock rash which he is using Lotrimin for, + itching in groins from rash, no skin infections, no pressure sores or ulcerations  Psych: no anxiety, no depression, no nervousness, no unusual recent stress  Eyes: no blurry vision, no floaters, no recent vision changes, + wears glasses or contacts  ENT: no hearing loss, no loose or painful teeth, no dentures, last saw  dentist May 2022  Hematologic: no easy bruising, no abnormal bleeding, no clotting disorder, no frequent epistaxis  Endocrine: + diabetes, does check CBG's at home  Physical Exam:  BP  125/72 (BP Location: Left Arm, Patient Position: Sitting)  Pulse 76  Resp 20  Ht 5\' 7"  (1.702 m)  Wt 164 lb (74.4 kg)  SpO2 96% Comment: RA  BMI 25.69 kg/m  General: well-appearing  HEENT: Unremarkable, NCAT, PERLA, EOMI  Neck: no JVD, no bruits, no adenopathy  Chest: clear to auscultation, symmetrical breath sounds, no wheezes, no rhonchi  CV: RRR, 3/6 systolic murmur RSB, no diastolic murmur  Abdomen: soft, non-tender, no masses  Extremities: warm, well-perfused, pulses palpable in ankles, lower extremity edema  Rectal/GU Deferred  Neuro: Grossly non-focal and symmetrical throughout  Skin: Clean and dry, no rashes, no breakdown  Diagnostic Tests:  ECHOCARDIOGRAM REPORT     Patient Name: Daryl Webb Date of Exam: 03/26/2020  Medical Rec #: [id number] Height: 67.0 in  Accession #: [id number] Weight: 175.0 lb  Date of Birth: 08/11/1945 BSA: 1.911 m  Patient Age: 52 years BP: 120/78 mmHg  Patient Gender: M HR: 78 bpm.  Exam Location: [address]   Procedure: 2D Echo, Cardiac Doppler and Color Doppler   Indications: I35.0 Nonrheumatic aortic (valve) stenosis   History: Patient has prior history of Echocardiogram examinations,  most  recent 02/12/2019. Signs/Symptoms:Dyspnea; Risk  Factors:Hypertension, Diabetes and Dyslipidemia. Chronic  kidney  disease. Low back pain.   Sonographer: Diamond Nickel RCS  Referring Phys: [place]    1. Left ventricular ejection fraction, by estimation, is 50 to 55%. The  left ventricle has low normal function. The left ventricle demonstrates  regional wall motion abnormalities (see scoring diagram/findings for  description). There is moderate  concentric left ventricular hypertrophy. Left ventricular diastolic  parameters are consistent with Grade II diastolic dysfunction  (pseudonormalization). Elevated left ventricular end-diastolic pressure.  There is moderate hypokinesis of the left  ventricular,  basal inferoseptal wall.  2. Right ventricular systolic function is normal. The right ventricular  size is normal.  3. Left atrial size was mild to moderately dilated.  4. The mitral valve is normal in structure. Trivial mitral valve  regurgitation. No evidence of mitral stenosis.  5. The aortic valve is calcified. There is severe calcifcation of the  aortic valve. There is moderate thickening of the aortic valve. Aortic  valve regurgitation is mild. Severe aortic valve stenosis. Aortic valve  area, by VTI measures 0.99 cm. Aortic  valve mean gradient measures 34.0 mmHg. Aortic valve Vmax measures 4.00  m/s.   Comparison(s): Changes from prior study are noted.   Conclusion(s)/Recommendation(s): Since last study, AS is severe. Peak  velocity is 4 m/s, AVA is 0.99.   FINDINGS  Left Ventricle: Left ventricular ejection fraction, by estimation, is 50  to 55%. The left ventricle has low normal function. The left ventricle  demonstrates regional wall motion abnormalities. Moderate hypokinesis of  the left ventricular, basal  inferoseptal wall. The left ventricular internal cavity size was normal in  size. There is moderate concentric left ventricular hypertrophy. Left  ventricular diastolic parameters are consistent with Grade II diastolic  dysfunction (pseudonormalization).  Elevated left ventricular end-diastolic pressure.   Right Ventricle: The right ventricular size is normal. No increase in  right ventricular wall thickness. Right ventricular systolic function is  normal.   Left Atrium: Left atrial size was mild to moderately dilated.   Right Atrium: Right atrial  size was normal in size.   Pericardium: There is no evidence of pericardial effusion.   Mitral Valve: The mitral valve is normal in structure. Mild mitral annular  calcification. Trivial mitral valve regurgitation. No evidence of mitral  valve stenosis.   Tricuspid Valve: The tricuspid valve is normal in structure.  Tricuspid  valve regurgitation is trivial. No evidence of tricuspid stenosis.   Aortic Valve: The aortic valve is calcified. There is severe calcifcation  of the aortic valve. There is moderate thickening of the aortic valve.  There is moderate aortic valve annular calcification. Aortic valve  regurgitation is mild. Aortic  regurgitation PHT measures 751 msec. Severe aortic stenosis is present.  Aortic valve mean gradient measures 34.0 mmHg. Aortic valve peak gradient  measures 64.0 mmHg. Aortic valve area, by VTI measures 0.99 cm.   Pulmonic Valve: The pulmonic valve was grossly normal. Pulmonic valve  regurgitation is not visualized.   Aorta: The aortic root and ascending aorta are structurally normal, with  no evidence of dilitation and the aortic arch was not well visualized.   Venous: The inferior vena cava was not well visualized.   IAS/Shunts: The atrial septum is grossly normal.    LEFT VENTRICLE  PLAX 2D  LVIDd: 5.00 cm Diastology  LVIDs: 3.50 cm LV e' medial: 4.35 cm/s  LV PW: 1.20 cm LV E/e' medial: 20.7  LV IVS: 1.40 cm LV e' lateral: 5.03 cm/s  LVOT diam: 2.20 cm LV E/e' lateral: 17.9  LV SV: 81  LV SV Index: 43  LVOT Area: 3.80 cm    RIGHT VENTRICLE  RV Basal diam: 1.30 cm  RV S prime: 16.70 cm/s  TAPSE (M-mode): 2.8 cm   LEFT ATRIUM Index RIGHT ATRIUM Index  LA diam: 3.80 cm 1.99 cm/m RA Area: 9.68 cm  LA Vol (A2C): 54.9 ml 28.73 ml/m RA Volume: 15.00 ml 7.85 ml/m  LA Vol (A4C): 61.7 ml 32.29 ml/m  LA Biplane Vol: 62.7 ml 32.82 ml/m  AORTIC VALVE  AV Area (Vmax): 0.81 cm  AV Area (Vmean): 0.78 cm  AV Area (VTI): 0.99 cm  AV Vmax: 400.00 cm/s  AV Vmean: 266.000 cm/s  AV VTI: 0.819 m  AV Peak Grad: 64.0 mmHg  AV Mean Grad: 34.0 mmHg  LVOT Vmax: 85.50 cm/s  LVOT Vmean: 54.900 cm/s  LVOT VTI: 0.214 m  LVOT/AV VTI ratio: 0.26  AI PHT: 751 msec   AORTA  Ao Root diam: 3.70 cm   MITRAL VALVE  MV Area (PHT): 3.47 cm SHUNTS  MV Decel  Time: 219 msec Systemic VTI: 0.21 m  MV E velocity: 89.83 cm/s Systemic Diam: 2.20 cm  MV A velocity: 92.27 cm/s  MV E/A ratio: 0.97   Buford Dresser MD  Electronically signed by Buford Dresser MD  Signature Date/Time: 03/26/2020/5:37:13 PM  Physicians  Panel Physicians Referring Physician Case Authorizing Physician  [name], MD (Primary)    Procedures  RIGHT/LEFT HEART CATH AND CORONARY ANGIOGRAPHY  Conclusion  1. Widely patent left main  2. Widely patent LAD with ostial stenosis of a small second diagonal branch, otherwise no significant obstruction in the LAD territory  3. Patent left circumflex with mild to moderate nonobstructive plaquing in the mid vessel  4. Patent RCA with mild nonobstructive stenosis, except for a 70% PDA stenosis in a relatively small caliber PDA branch  5. Calcific aortic valve with restricted leaflet mobility on plain fluoroscopy. Peak to peak transaortic gradient 43 mmHg, mean gradient 36 mmHg, calculated aortic valve area  1.17 cm  Recommendations: Patient with exertional symptoms, progressive aortic stenosis in the moderately severe range. Will review with multidisciplinary heart team, arrange structural heart consultation, arrange CT angiography studies, and consider options of continued close surveillance versus moving forward with aortic valve replacement. Recommend medical therapy for coronary artery disease.  Indications  Severe aortic stenosis [I35.0 (ICD-10-CM)]  Procedural Details  Technical Details INDICATION: 75 year old gentleman with progressive aortic stenosis, mild exertional dyspnea likely New York Heart Association functional class II symptoms. Echo data reveals moderately severe aortic stenosis. The patient is referred for right and left heart catheterization for further assessment.  PROCEDURAL DETAILS: There was an indwelling IV in a right antecubital vein. The IV could not be successfully changed out over a guidewire.  The IV is removed and manual pressure used for hemostasis. Using direct ultrasound guidance, an antecubital vein was accessed and a 7 French sheath was inserted. Ultrasound images are digitally captured and stored in the patient's chart. The right wrist was then prepped, draped, and anesthetized with 1% lidocaine. Using the modified Seldinger technique a 5/6 French Slender sheath was placed in the right radial artery. Intra-arterial verapamil was administered through the radial artery sheath. IV heparin was administered after a JR4 catheter was advanced into the central aorta. A Swan-Ganz catheter was used for the right heart catheterization. Standard protocol was followed for recording of right heart pressures and sampling of oxygen saturations. Fick cardiac output was calculated. Standard Judkins catheters were used for selective coronary angiography. LV pressure is recorded and an aortic valve pullback is performed. The aortic valve is difficult to cross. It is crossed with an AL-1 catheter and a straight tip wire. There were no immediate procedural complications. The patient was transferred to the post catheterization recovery area for further monitoring.    Estimated blood loss <50 mL.   During this procedure medications were administered to achieve and maintain moderate conscious sedation while the patient's heart rate, blood pressure, and oxygen saturation were continuously monitored and I was present face-to-face 100% of this time.  Medications  (Filter: Administrations occurring from 1005 to 1055 on 05/08/20)  important Continuous medications are totaled by the amount administered until 05/08/20 1055.  Heparin (Porcine) in NaCl 1000-0.9 UT/500ML-% SOLN (mL)  Total volume: 1,000 mL   Date/Time Rate/Dose/Volume Action   05/08/20 1013 500 mL Given   1013 500 mL Given   midazolam (VERSED) injection (mg)  Total dose: 2 mg   Date/Time Rate/Dose/Volume Action   05/08/20 1014 2 mg Given    fentaNYL (SUBLIMAZE) injection (mcg)  Total dose: 25 mcg   Date/Time Rate/Dose/Volume Action   05/08/20 1014 25 mcg Given   lidocaine (PF) (XYLOCAINE) 1 % injection (mL)  Total volume: 4 mL   Date/Time Rate/Dose/Volume Action   05/08/20 1023 2 mL Given   1028 2 mL Given   Radial Cocktail/Verapamil only (mL)  Total volume: 10 mL   Date/Time Rate/Dose/Volume Action   05/08/20 1030 10 mL Given   heparin sodium (porcine) injection (Units)  Total dose: 4,000 Units   Date/Time Rate/Dose/Volume Action   05/08/20 1035 4,000 Units Given   iohexol (OMNIPAQUE) 350 MG/ML injection (mL)  Total volume: 45 mL   Date/Time Rate/Dose/Volume Action   05/08/20 1050 45 mL Given   Sedation Time  Sedation Time Physician-1: 35 minutes 3 seconds  Contrast  Medication Name Total Dose  iohexol (OMNIPAQUE) 350 MG/ML injection 45 mL  Radiation/Fluoro  Fluoro time: 5.7 (min)  DAP: 12.1 (Gycm2)  Cumulative Air  Kerma: 216.9 (mGy)  Coronary Findings  Diagnostic  Dominance: Right  Left Main  The vessel exhibits minimal luminal irregularities.  Left Anterior Descending  There is mild diffuse disease throughout the vessel.  Left Circumflex  Mid Cx to Dist Cx lesion is 50% stenosed.  First Obtuse Marginal Branch  1st Mrg lesion is 30% stenosed. Large high OM branch, supplies a large territory  Right Coronary Artery  Prox RCA lesion is 40% stenosed.  Mid RCA lesion is 30% stenosed.  Right Posterior Descending Artery  RPDA lesion is 70% stenosed.  Intervention  No interventions have been documented.  Left Heart  Aortic Valve The aortic valve is calcified. There is restricted aortic valve motion.  Coronary Diagrams  Diagnostic  Dominance: Right   Intervention  Implants  No implant documentation for this case.   Syngo Images  Show images for CARDIAC CATHETERIZATION  Images on Long Term Storage  Show images for Zaman, JAHQUAN KLUGH to Procedure Log    Procedure Log  Hemo Data   Flowsheet Row Most Recent Value  Fick Cardiac Output 6.46 L/min  Fick Cardiac Output Index 3.42 (L/min)/BSA  Aortic Mean Gradient 36.2 mmHg  Aortic Peak Gradient 43 mmHg  Aortic Valve Area 1.17  Aortic Value Area Index 0.62 cm2/BSA  RA A Wave 6 mmHg  RA V Wave 3 mmHg  RA Mean 3 mmHg  RV Systolic Pressure 34 mmHg  RV Diastolic Pressure 0 mmHg  RV EDP 4 mmHg  PA Systolic Pressure 28 mmHg  PA Diastolic Pressure 10 mmHg  PA Mean 19 mmHg  PW A Wave 17 mmHg  PW V Wave 16 mmHg  PW Mean 13 mmHg  AO Systolic Pressure 956 mmHg  AO Diastolic Pressure 66 mmHg  AO Mean 92 mmHg  LV Systolic Pressure 387 mmHg  LV Diastolic Pressure 0 mmHg  LV EDP 15 mmHg  AOp Systolic Pressure 564 mmHg  AOp Diastolic Pressure 67 mmHg  AOp Mean Pressure 94 mmHg  LVp Systolic Pressure 332 mmHg  LVp Diastolic Pressure 0 mmHg  LVp EDP Pressure 16 mmHg  QP/QS 1  TPVR Index 5.55 HRUI  TSVR Index 26.86 HRUI  PVR SVR Ratio 0.07  TPVR/TSVR Ratio 0.21   ADDENDUM REPORT: 05/14/2020 14:49  CLINICAL DATA: 48 -year-old male with severe aortic stenosis being  evaluated for a TAVR procedure.  EXAM:  Cardiac TAVR CT  TECHNIQUE:  The patient was scanned on a [employer]. A 120 kV  retrospective scan was triggered in the descending thoracic aorta at  111 HU's. Gantry rotation speed was 250 msecs and collimation was .6  mm. No beta blockade or nitro were given. The 3D data set was  reconstructed in 5% intervals of the R-R cycle. Systolic and  diastolic phases were analyzed on a dedicated work station using  MPR, MIP and VRT modes. The patient received 80 cc of contrast.  FINDINGS:  Aortic Root:  Aortic valve: Trileaflet  Aortic valve calcium score: Calcium score 3435  Aortic annulus:  Diameter: 75mm x 41mm  Perimeter: 35mm  Area: 654 mm^2  Calcifications: No calcifications  Coronary height: Min Left - 40mm, Max Left - 9mm; Min Right - 64mm  Sinotubular height: Left cusp - 73mm; Right cusp -  17mm; Noncoronary  cusp - 76mm  LVOT (as measured 3 mm below the annulus):  Diameter: 56mm x 62mm  Area: 694 mm^2  Calcifications: No calcifications  Aortic sinus width: Left cusp - 78mm; Right cusp - 5mm; Noncoronary  cusp - 38mm  Sinotubular junction width: 60mm x 64mm  Optimum Fluoroscopic Angle for Delivery: LAO 2 CAU 8  Cardiac:  Right atrium: Normal size  Right ventricle: Normal size  Pulmonary arteries: Normal size  Pulmonary veins: Normal configuration  Left atrium: Mild enlargement  Left ventricle: Mild dilatation  Pericardium: Normal thickness  Coronary arteries: Coronary calcium score 807 (74th percentile)  IMPRESSION:  1. Trileaflet aortic valve with severe calcifications (AV calcium  score 3435)  2. Aortic annulus measures 39mm x 76mm in diameter with perimeter  19mm and area 654 mm^2. No annular or LVOT calcifications. Annular  measurements suitable for delivery of 36mm Edwards-Sapien 3 valve.  2. Sufficient coronary to annulus distance, measuring 62mm to left  main and 38mm to RCA  3. Optimum Fluoroscopic Angle for Delivery: LAO 2 CAU 8  4. Coronary calcium score 807 (74th percentile)  Electronically Signed  By: Oswaldo Milian MD  On: 05/14/2020 14:49   Addended by Donato Heinz, MD on 05/14/2020 2:51 PM  Study Result  Narrative & Impression  EXAM:  OVER-READ INTERPRETATION CT CHEST  The following report is an over-read performed by radiologist Dr.  Vinnie Langton of [facility], [place] on 05/14/2020. This  over-read does not include interpretation of cardiac or coronary  anatomy or pathology. The coronary calcium score/coronary CTA  interpretation by the cardiologist is attached.  COMPARISON: No priors.  FINDINGS:  Extracardiac findings will be described separately under dictation  for contemporaneously obtained CTA chest, abdomen and pelvis.  IMPRESSION:  Please see separate dictation for contemporaneously obtained CTA  chest,  abdomen and pelvis dated 05/14/2020 for full description of  relevant extracardiac findings.  Electronically Signed:  By: Vinnie Langton M.D.  On: 05/14/2020 13:04   Narrative & Impression  CLINICAL DATA: 75 year old male with history of severe aortic  stenosis under preprocedural evaluation prior to potential  transcatheter aortic valve replacement (TAVR) procedure.  EXAM:  CT ANGIOGRAPHY CHEST, ABDOMEN AND PELVIS  TECHNIQUE:  Non-contrast CT of the chest was initially obtained.  Multidetector CT imaging through the chest, abdomen and pelvis was  performed using the standard protocol during bolus administration of  intravenous contrast. Multiplanar reconstructed images and MIPs were  obtained and reviewed to evaluate the vascular anatomy.  CONTRAST: 18mL OMNIPAQUE IOHEXOL 350 MG/ML SOLN  COMPARISON: CT the abdomen and pelvis 01/23/2016.  FINDINGS:  CTA CHEST FINDINGS  Cardiovascular: Heart size is mildly enlarged. There is no  significant pericardial fluid, thickening or pericardial  calcification. There is aortic atherosclerosis, as well as  atherosclerosis of the great vessels of the mediastinum and the  coronary arteries, including calcified atherosclerotic plaque in the  left main, left anterior descending, left circumflex and right  coronary arteries. Severe thickening calcification of the aortic  valve.  Mediastinum/Lymph Nodes: No pathologically enlarged mediastinal or  hilar lymph nodes. Esophagus is unremarkable in appearance. No  axillary lymphadenopathy.  Lungs/Pleura: No acute consolidative airspace disease. No pleural  effusions. No suspicious appearing pulmonary nodules or masses are  noted.  Musculoskeletal/Soft Tissues: There are no aggressive appearing  lytic or blastic lesions noted in the visualized portions of the  skeleton.  CTA ABDOMEN AND PELVIS FINDINGS  Hepatobiliary: No suspicious cystic or solid hepatic lesions. No  intra or extrahepatic biliary  ductal dilatation. Gallbladder is  normal in appearance.  Pancreas: No pancreatic mass. No pancreatic ductal dilatation. No  pancreatic or peripancreatic fluid collections or inflammatory  changes.  Spleen: Unremarkable.  Adrenals/Urinary Tract: Mild atrophy of the  kidneys bilaterally. In  the interpolar region of the right kidney there is an exophytic 2 cm  low-intermediate attenuation (26 HU) lesion which is incompletely  characterized, but significantly larger than prior study from 2017  at which point this lesion measured only 9 mm. No  hydroureteronephrosis. Urinary bladder is normal in appearance.  Stomach/Bowel: The appearance of the stomach is normal. No  pathologic dilatation of small bowel or colon. Normal appendix.  Vascular/Lymphatic: Aortic atherosclerosis, without evidence of  aneurysm or dissection in the abdominal or pelvic vasculature.  Vascular findings and measurements pertinent to potential TAVR  procedure, as detailed below. No lymphadenopathy noted in the  abdomen or pelvis.  Reproductive: Status post radical prostatectomy.  Other: No significant volume of ascites. No pneumoperitoneum.  Musculoskeletal: Well-defined 2.4 x 1.0 cm sclerotic lesion in the  left ilium just lateral to the left sacroiliac joint, stable  compared to prior study from 2017, presumably a large bone island.  Status post PLIF from L3-L5 with interbody grafts at L3-L4 and  L4-L5. There are no other aggressive appearing lytic or blastic  lesions noted in the visualized portions of the skeleton.  VASCULAR MEASUREMENTS PERTINENT TO TAVR:  AORTA:  Minimal Aortic Diameter-15 x 14 mm  Severity of Aortic Calcification-moderate to severe  RIGHT PELVIS:  Right Common Iliac Artery -  Minimal Diameter-7.3 x 8.0 mm  Tortuosity-mild  Calcification-moderate to severe  Right External Iliac Artery -  Minimal Diameter-8.5 x 8.7 mm  Tortuosity-severe  Calcification-mild  Right Common Femoral Artery -   Minimal Diameter-8.9 x 9.4 mm  Tortuosity-mild  Calcification-mild  LEFT PELVIS:  Left Common Iliac Artery -  Minimal Diameter-9.5 x 8.2 mm  Tortuosity-mild  Calcification-moderate  Left External Iliac Artery -  Minimal Diameter-8.3 x 8.0 mm  Tortuosity-moderate  Calcification-mild  Left Common Femoral Artery -  Minimal Diameter-9.0 x 6.8 mm  Tortuosity-mild  Calcification-mild  Review of the MIP images confirms the above findings.  IMPRESSION:  1. Vascular findings and measurements to potential TAVR procedure,  as detailed above.  2. Severe thickening calcification of the aortic valve, compatible  with reported clinical history of severe aortic stenosis.  3. Mild cardiomegaly.  4. Aortic atherosclerosis, in addition to left main and 3 vessel  coronary artery disease. Assessment for potential risk factor  modification, dietary therapy or pharmacologic therapy may be  warranted, if clinically indicated.  5. Indeterminate lesion in the interpolar region of the right kidney  which has grown compared to prior study from 2017. Further  characterization with nonemergent abdominal MRI with and without IV  gadolinium is recommended in the near future to exclude potential  neoplasm.  6. Additional incidental findings, as above.  Electronically Signed  By: Vinnie Langton M.D.  On: 05/15/2020 06:52    STS RISK CALCULATOR:   AVR/CABG:  Risk of Mortality:  1.564%  Renal Failure:  2.267%  Permanent Stroke:  1.547%  Prolonged Ventilation:  6.419%  DSW Infection:  0.150%  Reoperation:  3.960%  Morbidity or Mortality:  11.381%  Short Length of Stay:  40.788%  Long Length of Stay:  5.231%    Impression:   This 75 year old gentleman has stage D, severe, symptomatic aortic stenosis with New York Heart Association class II symptoms of exertional fatigue and shortness of breath consistent with chronic diastolic congestive heart failure. I have personally reviewed his 2D  echo, cardiac catheterization, and CTA studies. His echo shows a severely calcified and moderately thickened trileaflet aortic valve with restricted mobility. The mean  gradient is 34 mmHg with a peak gradient of 43 mmHg and a valve area of 0.99 cm consistent with severe aortic stenosis. Left ventricular ejection fraction is low normal at 50 to 55% with moderate concentric LVH and grade 2 diastolic dysfunction. His cardiac catheterization shows moderate nonobstructive coronary disease with a 70% PDA stenosis and a relatively small branch. Peak to peak gradient across aortic valve was measured at 43 mmHg with a mean gradient of 36 mmHg. I agree that aortic valve replacement is indicated in this patient for relief of his symptoms and to prevent progressive left ventricular dysfunction. Given his age and comorbid risk factors including stage III chronic kidney disease I think transcatheter aortic valve replacement would be the best treatment option for him. His gated cardiac CTA shows anatomy suitable for TAVR using a 29 mm SAPIEN 3 valve. His abdominal and pelvic CTA shows adequate pelvic vascular anatomy to allow transfemoral insertion. His abdominal CTA did show an indeterminate lesion in the right kidney. He subsequently had an MRI which showed this to be an exophytic cyst but did not require further follow-up.  The patient and his wife were counseled at length regarding treatment alternatives for management of severe symptomatic aortic stenosis. The risks and benefits of surgical intervention has been discussed in detail. Long-term prognosis with medical therapy was discussed. Alternative approaches such as conventional surgical aortic valve replacement, transcatheter aortic valve replacement, and palliative medical therapy were compared and contrasted at length. This discussion was placed in the context of the patient's own specific clinical presentation and past medical history. All of their questions have been  addressed.  Following the decision to proceed with transcatheter aortic valve replacement, a discussion was held regarding what types of management strategies would be attempted intraoperatively in the event of life-threatening complications, including whether or not the patient would be considered a candidate for the use of cardiopulmonary bypass and/or conversion to open sternotomy for attempted surgical intervention. I think he would be a candidate for emergent sternotomy to manage any intraoperative complications. The patient is aware of the fact that transient use of cardiopulmonary bypass may be necessary. The patient has been advised of a variety of complications that might develop including but not limited to risks of death, stroke, paravalvular leak, aortic dissection or other major vascular complications, aortic annulus rupture, device embolization, cardiac rupture or perforation, mitral regurgitation, acute myocardial infarction, arrhythmia, heart block or bradycardia requiring permanent pacemaker placement, congestive heart failure, respiratory failure, renal failure, pneumonia, infection, other late complications related to structural valve deterioration or migration, or other complications that might ultimately cause a temporary or permanent loss of functional independence or other long term morbidity. The patient provides full informed consent for the procedure as described and all questions were answered.   Plan:   Transfemoral transcatheter aortic valve replacement.   Gaye Pollack, MD

## 2020-08-12 ENCOUNTER — Encounter (HOSPITAL_COMMUNITY)
Admission: RE | Disposition: A | Discharge status: Home / Self Care | Payer: Self-pay | Source: Home / Self Care | Attending: Cardiovascular Disease

## 2020-08-12 ENCOUNTER — Inpatient Hospital Stay (HOSPITAL_COMMUNITY)
Admission: RE | Admit: 2020-08-12 | Discharge: 2020-08-13 | DRG: 267 | Disposition: A | Discharge status: Home / Self Care | Payer: Medicare Other | Attending: Cardiovascular Disease | Admitting: Cardiovascular Disease

## 2020-08-12 ENCOUNTER — Inpatient Hospital Stay (HOSPITAL_COMMUNITY): Payer: Medicare Other | Admitting: Certified Registered"

## 2020-08-12 ENCOUNTER — Inpatient Hospital Stay (HOSPITAL_COMMUNITY)
Admission: RE | Admit: 2020-08-12 | Discharge: 2020-08-12 | Disposition: A | Discharge status: Home / Self Care | Payer: Medicare Other | Source: Ambulatory Visit | Attending: Cardiovascular Disease | Admitting: Cardiovascular Disease

## 2020-08-12 ENCOUNTER — Encounter: Payer: Self-pay | Admitting: Physician Assistant

## 2020-08-12 ENCOUNTER — Encounter (HOSPITAL_COMMUNITY): Payer: Self-pay | Admitting: Cardiovascular Disease

## 2020-08-12 ENCOUNTER — Other Ambulatory Visit: Payer: Self-pay | Admitting: Physician Assistant

## 2020-08-12 ENCOUNTER — Inpatient Hospital Stay (HOSPITAL_COMMUNITY): Payer: Medicare Other | Admitting: Vascular Surgery

## 2020-08-12 DIAGNOSIS — Z006 Encounter for examination for normal comparison and control in clinical research program: Secondary | ICD-10-CM | POA: Diagnosis not present

## 2020-08-12 DIAGNOSIS — Z96651 Presence of right artificial knee joint: Secondary | ICD-10-CM | POA: Diagnosis present

## 2020-08-12 DIAGNOSIS — I119 Hypertensive heart disease without heart failure: Secondary | ICD-10-CM | POA: Diagnosis present

## 2020-08-12 DIAGNOSIS — I10 Essential (primary) hypertension: Secondary | ICD-10-CM | POA: Diagnosis present

## 2020-08-12 DIAGNOSIS — Z8249 Family history of ischemic heart disease and other diseases of the circulatory system: Secondary | ICD-10-CM

## 2020-08-12 DIAGNOSIS — I5032 Chronic diastolic (congestive) heart failure: Secondary | ICD-10-CM | POA: Diagnosis not present

## 2020-08-12 DIAGNOSIS — Z8546 Personal history of malignant neoplasm of prostate: Secondary | ICD-10-CM | POA: Diagnosis not present

## 2020-08-12 DIAGNOSIS — Z79899 Other long term (current) drug therapy: Secondary | ICD-10-CM | POA: Diagnosis not present

## 2020-08-12 DIAGNOSIS — Z7982 Long term (current) use of aspirin: Secondary | ICD-10-CM

## 2020-08-12 DIAGNOSIS — Z833 Family history of diabetes mellitus: Secondary | ICD-10-CM | POA: Diagnosis not present

## 2020-08-12 DIAGNOSIS — Z8711 Personal history of peptic ulcer disease: Secondary | ICD-10-CM | POA: Diagnosis not present

## 2020-08-12 DIAGNOSIS — Z952 Presence of prosthetic heart valve: Secondary | ICD-10-CM

## 2020-08-12 DIAGNOSIS — N183 Chronic kidney disease, stage 3 unspecified: Secondary | ICD-10-CM | POA: Diagnosis not present

## 2020-08-12 DIAGNOSIS — I352 Nonrheumatic aortic (valve) stenosis with insufficiency: Secondary | ICD-10-CM | POA: Diagnosis not present

## 2020-08-12 DIAGNOSIS — E1129 Type 2 diabetes mellitus with other diabetic kidney complication: Secondary | ICD-10-CM

## 2020-08-12 DIAGNOSIS — Z7984 Long term (current) use of oral hypoglycemic drugs: Secondary | ICD-10-CM | POA: Diagnosis not present

## 2020-08-12 DIAGNOSIS — M1990 Unspecified osteoarthritis, unspecified site: Secondary | ICD-10-CM | POA: Diagnosis not present

## 2020-08-12 DIAGNOSIS — Z87891 Personal history of nicotine dependence: Secondary | ICD-10-CM

## 2020-08-12 DIAGNOSIS — E782 Mixed hyperlipidemia: Secondary | ICD-10-CM | POA: Diagnosis not present

## 2020-08-12 DIAGNOSIS — Z9079 Acquired absence of other genital organ(s): Secondary | ICD-10-CM | POA: Diagnosis not present

## 2020-08-12 DIAGNOSIS — I358 Other nonrheumatic aortic valve disorders: Secondary | ICD-10-CM | POA: Diagnosis not present

## 2020-08-12 DIAGNOSIS — M545 Low back pain, unspecified: Secondary | ICD-10-CM | POA: Diagnosis not present

## 2020-08-12 DIAGNOSIS — E785 Hyperlipidemia, unspecified: Secondary | ICD-10-CM | POA: Diagnosis present

## 2020-08-12 DIAGNOSIS — I130 Hypertensive heart and chronic kidney disease with heart failure and stage 1 through stage 4 chronic kidney disease, or unspecified chronic kidney disease: Secondary | ICD-10-CM | POA: Diagnosis not present

## 2020-08-12 DIAGNOSIS — I350 Nonrheumatic aortic (valve) stenosis: Secondary | ICD-10-CM

## 2020-08-12 DIAGNOSIS — I1310 Hypertensive heart and chronic kidney disease without heart failure, with stage 1 through stage 4 chronic kidney disease, or unspecified chronic kidney disease: Secondary | ICD-10-CM | POA: Diagnosis not present

## 2020-08-12 DIAGNOSIS — E119 Type 2 diabetes mellitus without complications: Secondary | ICD-10-CM

## 2020-08-12 DIAGNOSIS — I447 Left bundle-branch block, unspecified: Secondary | ICD-10-CM | POA: Diagnosis not present

## 2020-08-12 DIAGNOSIS — E7800 Pure hypercholesterolemia, unspecified: Secondary | ICD-10-CM | POA: Diagnosis present

## 2020-08-12 DIAGNOSIS — E1122 Type 2 diabetes mellitus with diabetic chronic kidney disease: Secondary | ICD-10-CM | POA: Diagnosis not present

## 2020-08-12 DIAGNOSIS — I2510 Atherosclerotic heart disease of native coronary artery without angina pectoris: Secondary | ICD-10-CM | POA: Diagnosis present

## 2020-08-12 DIAGNOSIS — N1832 Chronic kidney disease, stage 3b: Secondary | ICD-10-CM | POA: Diagnosis present

## 2020-08-12 HISTORY — PX: TEE WITHOUT CARDIOVERSION: SHX5443

## 2020-08-12 HISTORY — PX: TRANSCATHETER AORTIC VALVE REPLACEMENT, TRANSFEMORAL: SHX6400

## 2020-08-12 HISTORY — DX: Malignant neoplasm of prostate: C61

## 2020-08-12 HISTORY — DX: Presence of prosthetic heart valve: Z95.2

## 2020-08-12 LAB — POCT I-STAT, CHEM 8
BUN: 27 mg/dL — ABNORMAL HIGH (ref 8–23)
BUN: 26 mg/dL — ABNORMAL HIGH (ref 8–23)
Calcium, Ion: 1.33 mmol/L (ref 1.15–1.40)
Calcium, Ion: 1.21 mmol/L (ref 1.15–1.40)
Chloride: 104 mmol/L (ref 98–111)
Chloride: 106 mmol/L (ref 98–111)
Creatinine, Ser: 1.3 mg/dL — ABNORMAL HIGH (ref 0.61–1.24)
Creatinine, Ser: 1.4 mg/dL — ABNORMAL HIGH (ref 0.61–1.24)
Glucose, Bld: 162 mg/dL — ABNORMAL HIGH (ref 70–99)
Glucose, Bld: 166 mg/dL — ABNORMAL HIGH (ref 70–99)
HCT: 41 % (ref 39.0–52.0)
HCT: 37 % — ABNORMAL LOW (ref 39.0–52.0)
Hemoglobin: 13.9 g/dL (ref 13.0–17.0)
Hemoglobin: 12.6 g/dL — ABNORMAL LOW (ref 13.0–17.0)
Potassium: 4.2 mmol/L (ref 3.5–5.1)
Potassium: 4.8 mmol/L (ref 3.5–5.1)
Sodium: 139 mmol/L (ref 135–145)
Sodium: 139 mmol/L (ref 135–145)
TCO2: 25 mmol/L (ref 22–32)
TCO2: 22 mmol/L (ref 22–32)

## 2020-08-12 LAB — ECHOCARDIOGRAM LIMITED
AR max vel: 0.78 cm2
AV Area VTI: 0.87 cm2
AV Area mean vel: 0.74 cm2
AV Mean grad: 33 mmHg
AV Peak grad: 51.8 mmHg
Ao pk vel: 3.6 m/s
Calc EF: 45.3 %
Single Plane A2C EF: 46.5 %
Single Plane A4C EF: 45.2 %

## 2020-08-12 LAB — GLUCOSE, CAPILLARY
Glucose-Capillary: 170 mg/dL — ABNORMAL HIGH (ref 70–99)
Glucose-Capillary: 180 mg/dL — ABNORMAL HIGH (ref 70–99)
Glucose-Capillary: 222 mg/dL — ABNORMAL HIGH (ref 70–99)

## 2020-08-12 LAB — POCT ACTIVATED CLOTTING TIME
Activated Clotting Time: 144 seconds
Activated Clotting Time: 289 seconds
Activated Clotting Time: 126 seconds

## 2020-08-12 SURGERY — IMPLANTATION, AORTIC VALVE, TRANSCATHETER, FEMORAL APPROACH
Anesthesia: Monitor Anesthesia Care

## 2020-08-12 MED ORDER — MORPHINE SULFATE (PF) 2 MG/ML IV SOLN: 1.0000 mg | INTRAVENOUS | Status: DC | PRN

## 2020-08-12 MED ORDER — SODIUM CHLORIDE 0.9 % IV SOLN: INTRAVENOUS | Status: AC

## 2020-08-12 MED ORDER — PHENYLEPHRINE HCL-NACL 10-0.9 MG/250ML-% IV SOLN: 0.0000 ug/min | INTRAVENOUS | Status: DC

## 2020-08-12 MED ORDER — ONDANSETRON HCL 4 MG/2ML IJ SOLN: 4.0000 mg | Freq: Four times a day (QID) | INTRAMUSCULAR | Status: DC | PRN

## 2020-08-12 MED ORDER — SODIUM CHLORIDE 0.9 % IV SOLN: INTRAVENOUS | Status: DC

## 2020-08-12 MED ORDER — CHLORHEXIDINE GLUCONATE 4 % EX LIQD
30.0000 mL | CUTANEOUS | Status: DC
  Filled 2020-08-12: qty 30

## 2020-08-12 MED ORDER — MIDAZOLAM HCL 2 MG/2ML IJ SOLN
INTRAMUSCULAR | Status: DC | PRN
  Administered 2020-08-12: 2 mg via INTRAVENOUS

## 2020-08-12 MED ORDER — ACETAMINOPHEN 325 MG PO TABS: 650.0000 mg | ORAL_TABLET | Freq: Four times a day (QID) | ORAL | Status: DC | PRN

## 2020-08-12 MED ORDER — CHLORHEXIDINE GLUCONATE 0.12 % MT SOLN
15.0000 mL | Freq: Once | OROMUCOSAL | Status: AC
  Administered 2020-08-12: 15 mL via OROMUCOSAL
  Filled 2020-08-12: qty 15

## 2020-08-12 MED ORDER — LIDOCAINE HCL (PF) 1 % IJ SOLN
INTRAMUSCULAR | Status: AC
  Filled 2020-08-12: qty 30

## 2020-08-12 MED ORDER — ORAL CARE MOUTH RINSE: 15.0000 mL | Freq: Once | OROMUCOSAL | Status: AC

## 2020-08-12 MED ORDER — NITROGLYCERIN IN D5W 200-5 MCG/ML-% IV SOLN: 0.0000 ug/min | INTRAVENOUS | Status: DC

## 2020-08-12 MED ORDER — ONDANSETRON HCL 4 MG/2ML IJ SOLN
INTRAMUSCULAR | Status: DC | PRN
  Administered 2020-08-12: 4 mg via INTRAVENOUS

## 2020-08-12 MED ORDER — ACETAMINOPHEN 650 MG RE SUPP: 650.0000 mg | Freq: Four times a day (QID) | RECTAL | Status: DC | PRN

## 2020-08-12 MED ORDER — SODIUM CHLORIDE 0.9 % IV SOLN: 250.0000 mL | INTRAVENOUS | Status: DC

## 2020-08-12 MED ORDER — SODIUM CHLORIDE 0.9% FLUSH: 3.0000 mL | INTRAVENOUS | Status: DC | PRN

## 2020-08-12 MED ORDER — SODIUM CHLORIDE 0.9 % IV SOLN: 250.0000 mL | INTRAVENOUS | Status: DC | PRN

## 2020-08-12 MED ORDER — ASPIRIN 81 MG PO CHEW
81.0000 mg | CHEWABLE_TABLET | Freq: Every day | ORAL | Status: DC
  Administered 2020-08-12 – 2020-08-13 (×2): 81 mg via ORAL
  Filled 2020-08-12 (×2): qty 1

## 2020-08-12 MED ORDER — LACTATED RINGERS IV SOLN: INTRAVENOUS | Status: DC

## 2020-08-12 MED ORDER — DIPHENHYDRAMINE HCL 25 MG PO CAPS
25.0000 mg | ORAL_CAPSULE | Freq: Every evening | ORAL | Status: DC | PRN
  Administered 2020-08-12: 25 mg via ORAL
  Filled 2020-08-12: qty 1

## 2020-08-12 MED ORDER — HEPARIN SODIUM (PORCINE) 1000 UNIT/ML IJ SOLN
INTRAMUSCULAR | Status: DC | PRN
  Administered 2020-08-12: 11000 [IU] via INTRAVENOUS

## 2020-08-12 MED ORDER — TRAMADOL HCL 50 MG PO TABS: 50.0000 mg | ORAL_TABLET | ORAL | Status: DC | PRN

## 2020-08-12 MED ORDER — FENTANYL CITRATE (PF) 250 MCG/5ML IJ SOLN
INTRAMUSCULAR | Status: DC | PRN
  Administered 2020-08-12: 50 ug via INTRAVENOUS

## 2020-08-12 MED ORDER — OXYCODONE HCL 5 MG PO TABS
5.0000 mg | ORAL_TABLET | ORAL | Status: DC | PRN
Start: 2020-08-12 — End: 2020-08-13

## 2020-08-12 MED ORDER — SODIUM CHLORIDE 0.9% FLUSH
3.0000 mL | Freq: Two times a day (BID) | INTRAVENOUS | Status: DC
  Administered 2020-08-12: 3 mL via INTRAVENOUS

## 2020-08-12 MED ORDER — ATORVASTATIN CALCIUM 40 MG PO TABS
40.0000 mg | ORAL_TABLET | Freq: Every day | ORAL | Status: DC
  Administered 2020-08-12: 40 mg via ORAL
  Filled 2020-08-12: qty 1

## 2020-08-12 MED ORDER — HEPARIN (PORCINE) IN NACL 1000-0.9 UT/500ML-% IV SOLN
INTRAVENOUS | Status: DC | PRN
  Administered 2020-08-12 (×3): 500 mL

## 2020-08-12 MED ORDER — PHENYLEPHRINE 40 MCG/ML (10ML) SYRINGE FOR IV PUSH (FOR BLOOD PRESSURE SUPPORT)
PREFILLED_SYRINGE | INTRAVENOUS | Status: DC | PRN
  Administered 2020-08-12: 80 ug via INTRAVENOUS

## 2020-08-12 MED ORDER — CEFAZOLIN SODIUM-DEXTROSE 2-4 GM/100ML-% IV SOLN
2.0000 g | Freq: Three times a day (TID) | INTRAVENOUS | Status: AC
  Administered 2020-08-12 – 2020-08-13 (×2): 2 g via INTRAVENOUS
  Filled 2020-08-12 (×2): qty 100

## 2020-08-12 MED ORDER — HEPARIN (PORCINE) IN NACL 1000-0.9 UT/500ML-% IV SOLN
INTRAVENOUS | Status: AC
  Filled 2020-08-12: qty 1500

## 2020-08-12 MED ORDER — LIDOCAINE-EPINEPHRINE 1 %-1:100000 IJ SOLN
INTRAMUSCULAR | Status: DC | PRN
  Administered 2020-08-12: 1 mL

## 2020-08-12 MED ORDER — CHLORHEXIDINE GLUCONATE 4 % EX LIQD
60.0000 mL | Freq: Once | CUTANEOUS | Status: DC
  Filled 2020-08-12: qty 60

## 2020-08-12 MED ORDER — PROTAMINE SULFATE 10 MG/ML IV SOLN
INTRAVENOUS | Status: DC | PRN
  Administered 2020-08-12: 110 mg via INTRAVENOUS

## 2020-08-12 MED ORDER — CHLORHEXIDINE GLUCONATE 0.12 % MT SOLN
15.0000 mL | Freq: Once | OROMUCOSAL | Status: DC
  Filled 2020-08-12 (×2): qty 15

## 2020-08-12 MED ORDER — LIDOCAINE HCL (PF) 1 % IJ SOLN
INTRAMUSCULAR | Status: DC | PRN
  Administered 2020-08-12 (×2): 10 mL

## 2020-08-12 MED ORDER — CHLORHEXIDINE GLUCONATE 4 % EX LIQD
60.0000 mL | Freq: Once | CUTANEOUS | Status: DC
Start: 2020-08-12 — End: 2020-08-12
  Filled 2020-08-12: qty 60

## 2020-08-12 MED ORDER — CLOPIDOGREL BISULFATE 75 MG PO TABS
75.0000 mg | ORAL_TABLET | Freq: Every day | ORAL | Status: DC
  Administered 2020-08-13: 75 mg via ORAL
  Filled 2020-08-12: qty 1

## 2020-08-12 MED ORDER — IOHEXOL 350 MG/ML SOLN
INTRAVENOUS | Status: DC | PRN
  Administered 2020-08-12: 24 mL

## 2020-08-12 MED ORDER — LIDOCAINE-EPINEPHRINE 1 %-1:100000 IJ SOLN
INTRAMUSCULAR | Status: AC
  Filled 2020-08-12: qty 1

## 2020-08-12 MED ORDER — LORATADINE 10 MG PO TABS: 5.0000 mg | ORAL_TABLET | Freq: Every day | ORAL | Status: DC | PRN

## 2020-08-12 SURGICAL SUPPLY — 30 items
BAG SNAP BAND KOVER 36X36 (MISCELLANEOUS) ×4 IMPLANT
BLANKET WARM UNDERBOD FULL ACC (MISCELLANEOUS) ×2 IMPLANT
CABLE ADAPT PACING TEMP 12FT (ADAPTER) ×1 IMPLANT
CATH 29 EDWARDS DELIVERY SYS (CATHETERS) ×1 IMPLANT
CATH DIAG 6FR PIGTAIL ANGLED (CATHETERS) ×2 IMPLANT
CATH INFINITI 5 FR AL2 (CATHETERS) ×1 IMPLANT
CATH S G BIP PACING (CATHETERS) ×1 IMPLANT
CLOSURE MYNX CONTROL 6F/7F (Vascular Products) ×2 IMPLANT
CLOSURE PERCLOSE PROSTYLE (VASCULAR PRODUCTS) ×2 IMPLANT
CRIMPER (MISCELLANEOUS) ×1 IMPLANT
DEVICE INFLATION ATRION QL38 (MISCELLANEOUS) ×1 IMPLANT
GUIDEWIRE SAFE TJ AMPLATZ EXST (WIRE) ×1 IMPLANT
KIT HEART LEFT (KITS) ×2 IMPLANT
KIT MICROPUNCTURE NIT STIFF (SHEATH) ×1 IMPLANT
PACK CARDIAC CATHETERIZATION (CUSTOM PROCEDURE TRAY) ×2 IMPLANT
SHEATH BRITE TIP 7FR 35CM (SHEATH) ×1 IMPLANT
SHEATH EDWARDS INTRO SET 16X36 (SHEATH) ×1 IMPLANT
SHEATH PINNACLE 6F 10CM (SHEATH) ×1 IMPLANT
SHEATH PINNACLE 7F 10CM (SHEATH) ×1 IMPLANT
SHEATH PINNACLE 8F 10CM (SHEATH) ×1 IMPLANT
SHIELD RADPAD SCOOP 12X17 (MISCELLANEOUS) ×1 IMPLANT
SLEEVE REPOSITIONING LENGTH 30 (MISCELLANEOUS) ×1 IMPLANT
STOPCOCK MORSE 400PSI 3WAY (MISCELLANEOUS) ×4 IMPLANT
TRANSDUCER W/STOPCOCK (MISCELLANEOUS) ×4 IMPLANT
TUBING CONTRAST HIGH PRESS 48 (TUBING) ×1 IMPLANT
VALVE HEART TRANSCATH SZ3 29MM (Valve) ×1 IMPLANT
WIRE AMPLATZ SS-J .035X180CM (WIRE) ×1 IMPLANT
WIRE EMERALD 3MM-J .035X150CM (WIRE) ×1 IMPLANT
WIRE EMERALD 3MM-J .035X260CM (WIRE) ×1 IMPLANT
WIRE EMERALD ST .035X260CM (WIRE) ×1 IMPLANT

## 2020-08-12 NOTE — Progress Notes (Signed)
  Echocardiogram 2D Echocardiogram has been performed.  Daryl Webb 08/12/2020, 2:44 PM

## 2020-08-12 NOTE — Transfer of Care (Signed)
Immediate Anesthesia Transfer of Care Note  Patient: Daryl Webb  Procedure(s) Performed: TRANSCATHETER AORTIC VALVE REPLACEMENT, TRANSFEMORAL TRANSESOPHAGEAL ECHOCARDIOGRAM (TEE)  Patient Location: Cath Lab  Anesthesia Type:MAC  Level of Consciousness: drowsy and patient cooperative  Airway & Oxygen Therapy: Patient Spontanous Breathing and Patient connected to nasal cannula oxygen  Post-op Assessment: Report given to RN, Post -op Vital signs reviewed and stable and Patient moving all extremities  Post vital signs: Reviewed and stable  Last Vitals:  Vitals Value Taken Time  BP 91/62 08/12/20 1511  Temp 36.4 C 08/12/20 1513  Pulse 75 08/12/20 1514  Resp 16 08/12/20 1514  SpO2 98 % 08/12/20 1514  Vitals shown include unvalidated device data.  Last Pain:  Vitals:   08/12/20 1513  TempSrc: Temporal  PainSc: Asleep         Complications: No notable events documented.

## 2020-08-12 NOTE — Discharge Instructions (Addendum)
PLEASE IGNORE INSTRUCTIONS TO "ASK YOUR DR" ABOUT CERTAIN MEDICATIONS ON YOUR DISCHARGE MED LIST. PLEASE RESUME ALL YOUR NORMAL MEDS WITH THE EXCEPTION OF METFORMIN. HOLD THAT ONE MORE DAY AND RESUME ON 7/7 PM. THE ONLY MEDICATION CHANGE IS THE ADDITION OF PLAVIX WHICH YOU CAN PICK UP AT [facility].   ACTIVITY AND EXERCISE  Daily activity and exercise are an important part of your recovery. People recover at different rates depending on their general health and type of valve procedure.  Most people recovering from TAVR feel better relatively quickly   No lifting, pushing, pulling more than 10 pounds (examples to avoid: groceries, vacuuming, gardening, golfing):             - For one week with a procedure through the groin.             - For six weeks for procedures through the chest wall or neck. NOTE: You will typically see one of our providers 7-14 days after your procedure to discuss [place] the above activities.      DRIVING  Do not drive until you are seen for follow up and cleared by a provider. Generally, we ask patient to not drive for 1 week after their procedure.  If you have been told by your doctor in the past that you may not drive, you must talk with him/her before you begin driving again.   DRESSING  Groin site: you may leave the clear dressing over the site for up to one week or until it falls off.   HYGIENE  If you had a femoral (leg) procedure, you may take a shower when you return home. After the shower, pat the site dry. Do NOT use powder, oils or lotions in your groin area until the site has completely healed.  If you had a chest procedure, you may shower when you return home unless specifically instructed not to by your discharging practitioner.             - DO NOT scrub incision; pat dry with a towel.             - DO NOT apply any lotions, oils, powders to the incision.             - No tub baths / swimming for at least 2 weeks.  If you notice any fevers,  chills, increased pain, swelling, bleeding or pus, please contact your doctor.   ADDITIONAL INFORMATION  If you are going to have an upcoming dental procedure, please contact our office as you will require antibiotics ahead of time to prevent infection on your heart valve.    If you have any questions or concerns you can call the structural heart phone during normal business hours 8am-4pm. If you have an urgent need after hours or weekends please call [phone] to talk to the on call provider for general cardiology. If you have an emergency that requires immediate attention, please call 911.    After TAVR Checklist  Check  Test Description   Follow up appointment in 1-2 weeks  You will see our structural heart physician assistant, Nell Range. Your incision sites will be checked and you will be cleared to drive and resume all normal activities if you are doing well.     1 month echo and follow up  You will have an echo to check on your new heart valve and be seen back in the office by Nell Range. Many times the  echo is not read by your appointment time, but Joellen Jersey will call you later that day or the following day to report your results.   Follow up with your primary cardiologist You will need to be seen by your primary cardiologist in the following 3-6 months after your 1 month appointment in the valve clinic. Often times your Plavix or Aspirin will be discontinued during this time, but this is decided on a case by case basis.    1 year echo and follow up You will have another echo to check on your heart valve after 1 year and be seen back in the office by Nell Range. This your last structural heart visit.   Bacterial endocarditis prophylaxis  You will have to take antibiotics for the rest of your life before all dental procedures (even teeth cleanings) to protect your heart valve. Antibiotics are also required before some surgeries. Please check with your cardiologist before scheduling  any surgeries. Also, please make sure to tell us if you have a penicillin allergy as you will require an alternative antibiotic.

## 2020-08-12 NOTE — Interval H&P Note (Signed)
History and Physical Interval Note:  08/12/2020 11:43 AM  Daryl Webb  has presented today for surgery, with the diagnosis of Severe Aortic Stenosis.  The various methods of treatment have been discussed with the patient and family. After consideration of risks, benefits and other options for treatment, the patient has consented to  Procedure(s): TRANSCATHETER AORTIC VALVE REPLACEMENT, TRANSFEMORAL (N/A) TRANSESOPHAGEAL ECHOCARDIOGRAM (TEE) (N/A) as a surgical intervention.  The patient's history has been reviewed, patient examined, no change in status, stable for surgery.  I have reviewed the patient's chart and labs.  Questions were answered to the patient's satisfaction.     Gaye Pollack

## 2020-08-12 NOTE — Anesthesia Procedure Notes (Signed)
Procedure Name: MAC Date/Time: 08/12/2020 1:20 PM Performed by: Moshe Salisbury, CRNA Pre-anesthesia Checklist: Patient identified, Emergency Drugs available, Suction available, Patient being monitored and Timeout performed Patient Re-evaluated:Patient Re-evaluated prior to induction Oxygen Delivery Method: Nasal cannula Placement Confirmation: positive ETCO2 Dental Injury: Teeth and Oropharynx as per pre-operative assessment

## 2020-08-12 NOTE — Progress Notes (Signed)
@   2103 Dr. Blossom Hoops, on-call for attending, text-paged regarding pt's request for Benadryl as a sleep aid (pt endorses he utilizes it at home).  Page promptly returned and order received for PRN Benadryl QHS.

## 2020-08-12 NOTE — Op Note (Signed)
[facility]   MULTIDISCIPLINARY HEART VALVE TEAM   TAVR OPERATIVE NOTE   Date of Procedure:  08/12/2020  Preoperative Diagnosis: Severe Aortic Stenosis   Postoperative Diagnosis: Same   Procedure:   Transcatheter Aortic Valve Replacement - Percutaneous Right Transfemoral Approach  Edwards Sapien 3 THV (size 29 mm, model # 9600TFX, serial # [id number])   Co-Surgeons:  Gaye Pollack, MD and [name], MD     Anesthesiologist:  C. Ermalene Postin, MD  Echocardiographer:  Jerilynn Mages. Croitoru, MD  Pre-operative Echo Findings: Severe aortic stenosis Normal left ventricular systolic function  Post-operative Echo Findings: No paravalvular leak Normal left ventricular systolic function   BRIEF CLINICAL NOTE AND INDICATIONS FOR SURGERY  This 75 year old gentleman has stage D, severe, symptomatic aortic stenosis with New York Heart Association class II symptoms of exertional fatigue and shortness of breath consistent with chronic diastolic congestive heart failure. I have personally reviewed his 2D echo, cardiac catheterization, and CTA studies. His echo shows a severely calcified and moderately thickened trileaflet aortic valve with restricted mobility. The mean gradient is 34 mmHg with a peak gradient of 43 mmHg and a valve area of 0.99 cm consistent with severe aortic stenosis. Left ventricular ejection fraction is low normal at 50 to 55% with moderate concentric LVH and grade 2 diastolic dysfunction. His cardiac catheterization shows moderate nonobstructive coronary disease with a 70% PDA stenosis and a relatively small branch. Peak to peak gradient across aortic valve was measured at 43 mmHg with a mean gradient of 36 mmHg. I agree that aortic valve replacement is indicated in this patient for relief of his symptoms and to prevent progressive left ventricular dysfunction. Given his age and comorbid risk factors including stage III chronic kidney disease I think transcatheter aortic  valve replacement would be the best treatment option for him. His gated cardiac CTA shows anatomy suitable for TAVR using a 29 mm SAPIEN 3 valve. His abdominal and pelvic CTA shows adequate pelvic vascular anatomy to allow transfemoral insertion. His abdominal CTA did show an indeterminate lesion in the right kidney. He subsequently had an MRI which showed this to be an exophytic cyst but did not require further follow-up. The patient and his wife were counseled at length regarding treatment alternatives for management of severe symptomatic aortic stenosis. The risks and benefits of surgical intervention has been discussed in detail. Long-term prognosis with medical therapy was discussed. Alternative approaches such as conventional surgical aortic valve replacement, transcatheter aortic valve replacement, and palliative medical therapy were compared and contrasted at length. This discussion was placed in the context of the patient's own specific clinical presentation and past medical history. All of their questions have been addressed. Following the decision to proceed with transcatheter aortic valve replacement, a discussion was held regarding what types of management strategies would be attempted intraoperatively in the event of life-threatening complications, including whether or not the patient would be considered a candidate for the use of cardiopulmonary bypass and/or conversion to open sternotomy for attempted surgical intervention. I think he would be a candidate for emergent sternotomy to manage any intraoperative complications. The patient is aware of the fact that transient use of cardiopulmonary bypass may be necessary. The patient has been advised of a variety of complications that might develop including but not limited to risks of death, stroke, paravalvular leak, aortic dissection or other major vascular complications, aortic annulus rupture, device embolization, cardiac rupture or perforation,  mitral regurgitation, acute myocardial infarction, arrhythmia, heart block or bradycardia requiring  permanent pacemaker placement, congestive heart failure, respiratory failure, renal failure, pneumonia, infection, other late complications related to structural valve deterioration or migration, or other complications that might ultimately cause a temporary or permanent loss of functional independence or other long term morbidity. The patient provides full informed consent for the procedure as described and all questions were answered.    DETAILS OF THE OPERATIVE PROCEDURE  PREPARATION:    The patient was brought to the operating room on the above mentioned date and appropriate monitoring was established by the anesthesia team. The patient was placed in the supine position on the operating table.  Intravenous antibiotics were administered. The patient was monitored closely throughout the procedure under conscious sedation.    Baseline transthoracic echocardiogram was performed. The patient's abdomen and both groins were prepped and draped in a sterile manner. A time out procedure was performed.   PERIPHERAL ACCESS:    Using the modified Seldinger technique, femoral arterial and venous access was obtained with placement of 6 Fr sheaths on the left side.  A pigtail diagnostic catheter was passed through the left arterial sheath under fluoroscopic guidance into the aortic root.  A temporary transvenous pacemaker catheter was passed through the left femoral venous sheath under fluoroscopic guidance into the right ventricle.  The pacemaker was tested to ensure stable lead placement and pacemaker capture. Aortic root angiography was performed in order to determine the optimal angiographic angle for valve deployment.   TRANSFEMORAL ACCESS:   Percutaneous transfemoral access and sheath placement was performed using ultrasound guidance.  The right common femoral artery was cannulated using a micropuncture  needle and appropriate location was verified using hand injection angiogram.  A pair of Abbott Perclose percutaneous closure devices were placed and a 6 French sheath replaced into the femoral artery.  The patient was heparinized systemically and ACT verified > 250 seconds.    A 16 Fr transfemoral E-sheath was introduced into the right common femoral artery after progressively dilating over an Amplatz superstiff wire. An AL-2 catheter was used to direct a straight-tip exchange length wire across the native aortic valve into the left ventricle. This was exchanged out for a pigtail catheter and position was confirmed in the LV apex. Simultaneous LV and Ao pressures were recorded.  The pigtail catheter was exchanged for an Amplatz Extra-stiff wire in the LV apex.    BALLOON AORTIC VALVULOPLASTY:    TRANSCATHETER HEART VALVE DEPLOYMENT:   An Edwards Sapien 3 transcatheter heart valve (size 29 mm) was prepared and crimped per manufacturer's guidelines, and the proper orientation of the valve is confirmed on the [employer] delivery system. The valve was advanced through the introducer sheath using normal technique until in an appropriate position in the abdominal aorta beyond the sheath tip. The balloon was then retracted and using the fine-tuning wheel was centered on the valve. The valve was then advanced across the aortic arch using appropriate flexion of the catheter. The valve was carefully positioned across the aortic valve annulus. The Commander catheter was retracted using normal technique. Once final position of the valve has been confirmed by angiographic assessment, the valve is deployed while temporarily holding ventilation and during rapid ventricular pacing to maintain systolic blood pressure < 50 mmHg and pulse pressure < 10 mmHg. The balloon inflation is held for >3 seconds after reaching full deployment volume. Once the balloon has fully deflated the balloon is retracted into the ascending  aorta and valve function is assessed using echocardiography. There is felt to be  no paravalvular leak and no central aortic insufficiency.  The patient's hemodynamic recovery following valve deployment is good.  The deployment balloon and guidewire are both removed.    PROCEDURE COMPLETION:   The sheath was removed and femoral artery closure performed.  Protamine was administered once femoral arterial repair was complete. The temporary pacemaker, pigtail catheters and femoral sheaths were removed with manual pressure used for hemostasis.  A Mynx femoral closure device was utilized following removal of the diagnostic sheath in the left femoral artery.  The patient tolerated the procedure well and is transported to the cath lab recovery area in stable condition. There were no immediate intraoperative complications. All sponge instrument and needle counts are verified correct at completion of the operation.   No blood products were administered during the operation.  The patient received a total of 24 mL of intravenous contrast during the procedure.   Gaye Pollack, MD 08/12/2020

## 2020-08-12 NOTE — Op Note (Signed)
[facility]   MULTIDISCIPLINARY HEART VALVE TEAM   TAVR OPERATIVE NOTE   Date of Procedure:  08/12/2020  Preoperative Diagnosis: Severe Aortic Stenosis   Postoperative Diagnosis: Same   Procedure:   Transcatheter Aortic Valve Replacement - Percutaneous  Transfemoral Approach  Edwards Sapien 3 THV (size 29 mm, model # 9600TFX, serial # [id number])   Co-Surgeons:  Gaye Pollack, MD and [name], MD  Anesthesiologist:  Dr Ermalene Postin  Echocardiographer:  Dr Croitoru  Pre-operative Echo Findings: Severe aortic stenosis Normal left ventricular systolic function  Post-operative Echo Findings: No paravalvular leak Normal/unchanged left ventricular systolic function  BRIEF CLINICAL NOTE AND INDICATIONS FOR SURGERY  75 year old gentleman with hypertension, mixed hyperlipidemia, type 2 diabetes, and stage III chronic kidney disease, presents with symptoms of progressive and now severe aortic stenosis.  He is here for treatment with TAVR today after undergoing multidisciplinary heart team review.  He has developed progressive exertional fatigue, exertional dyspnea, and intermittent dizziness.  Multidisciplinary evaluation has demonstrated nonobstructive CAD, preserved LV function, and cardiac anatomy suitable for a 29 mm Edwards SAPIEN 3 valve.  During the course of the patient's preoperative work up they have been evaluated comprehensively by a multidisciplinary team of specialists coordinated through the [facility] in the [facility].  They have been demonstrated to suffer from symptomatic severe aortic stenosis as noted above. The patient has been counseled extensively as to the relative risks and benefits of all options for the treatment of severe aortic stenosis including long term medical therapy, conventional surgery for aortic valve replacement, and transcatheter aortic valve replacement.  The patient has been  independently evaluated in formal cardiac surgical consultation by Dr Cyndia Bent, who deemed the patient appropriate for TAVR. Based upon review of all of the patient's preoperative diagnostic tests they are felt to be candidate for transcatheter aortic valve replacement using the transfemoral approach as an alternative to conventional surgery.    Following the decision to proceed with transcatheter aortic valve replacement, a discussion has been held regarding what types of management strategies would be attempted intraoperatively in the event of life-threatening complications, including whether or not the patient would be considered a candidate for the use of cardiopulmonary bypass and/or conversion to open sternotomy for attempted surgical intervention.  The patient has been advised of a variety of complications that might develop peculiar to this approach including but not limited to risks of death, stroke, paravalvular leak, aortic dissection or other major vascular complications, aortic annulus rupture, device embolization, cardiac rupture or perforation, acute myocardial infarction, arrhythmia, heart block or bradycardia requiring permanent pacemaker placement, congestive heart failure, respiratory failure, renal failure, pneumonia, infection, other late complications related to structural valve deterioration or migration, or other complications that might ultimately cause a temporary or permanent loss of functional independence or other long term morbidity.  The patient provides full informed consent for the procedure as described and all questions were answered preoperatively.  DETAILS OF THE OPERATIVE PROCEDURE  PREPARATION:   The patient is brought to the operating room on the above mentioned date and central monitoring was established by the anesthesia team including placement of a radial arterial line. The patient is placed in the supine position on the operating table.  Intravenous antibiotics are  administered. The patient is monitored closely throughout the procedure under conscious sedation.  Baseline transthoracic echocardiogram is performed. The patient's chest, abdomen, both groins, and both lower extremities are prepared and draped in a sterile  manner. A time out procedure is performed.   PERIPHERAL ACCESS:   Using ultrasound guidance, femoral arterial access is obtained with placement of 6 Fr sheath on the left side.  A pigtail diagnostic catheter was passed through the femoral arterial sheath under fluoroscopic guidance into the aortic root.  Femoral venous access is obtained on the right side because the left femoral vein courses underneath the artery.  A long 7 French sheath is inserted.  A temporary transvenous pacemaker catheter was passed through the femoral venous sheath under fluoroscopic guidance into the right ventricle.  The pacemaker was tested to ensure stable lead placement and pacemaker capture. Aortic root angiography was performed in order to determine the optimal angiographic angle for valve deployment.  TRANSFEMORAL ACCESS:  A micropuncture technique is used to access the right femoral artery under fluoroscopic and ultrasound guidance.  2 Perclose devices are deployed at 10' and 2' positions to 'PreClose' the femoral artery. An 8 French sheath is placed and then an Amplatz Superstiff wire is advanced through the sheath. This is changed out for a 16 French transfemoral E-Sheath after progressively dilating over the Superstiff wire.  An AL-2 catheter was used to direct a straight-tip exchange length wire across the native aortic valve into the left ventricle. This was exchanged out for a pigtail catheter and position was confirmed in the LV apex. Simultaneous LV and Ao pressures were recorded.  The pigtail catheter was exchanged for an Amplatz Extra-stiff wire in the LV apex.    BALLOON AORTIC VALVULOPLASTY:  Not performed  TRANSCATHETER HEART VALVE DEPLOYMENT:  An  Edwards Sapien 3 transcatheter heart valve (size 29 mm) was prepared and crimped per manufacturer's guidelines, and the proper orientation of the valve is confirmed on the [employer] delivery system. The valve was advanced through the introducer sheath using normal technique until in an appropriate position in the abdominal aorta beyond the sheath tip. The balloon was then retracted and using the fine-tuning wheel was centered on the valve. The valve was then advanced across the aortic arch using appropriate flexion of the catheter. The valve was carefully positioned across the aortic valve annulus. The Commander catheter was retracted using normal technique. Once final position of the valve has been confirmed by angiographic assessment, the valve is deployed while temporarily holding ventilation and during rapid ventricular pacing to maintain systolic blood pressure < 50 mmHg and pulse pressure < 10 mmHg. The balloon inflation is held for >3 seconds after reaching full deployment volume. Once the balloon has fully deflated the balloon is retracted into the ascending aorta and valve function is assessed using echocardiography. The patient's hemodynamic recovery following valve deployment is good.  The deployment balloon and guidewire are both removed. Echo demostrated acceptable post-procedural gradients, stable mitral valve function, and no aortic insufficiency.    PROCEDURE COMPLETION:  The sheath was removed and femoral artery closure is performed using the 2 previously deployed Perclose devices.  Protamine is administered once femoral arterial repair was complete. The site is clear with no evidence of bleeding or hematoma after the sutures are tightened. The temporary pacemaker and pigtail catheters are removed. Mynx closure is used for contralateral femoral arterial hemostasis for the 6 Fr sheath.  Mynx closure is also used for the femoral venous puncture site on the right.  The patient tolerated  the procedure well and is transported to the surgical intensive care in stable condition. There were no immediate intraoperative complications. All sponge instrument and needle counts are verified correct  at completion of the operation.   The patient received a total of 24 mL of intravenous contrast during the procedure.   [name], MD 08/12/2020 3:58 PM

## 2020-08-12 NOTE — Progress Notes (Signed)
  [facility] VALVE TEAM  Patient doing well s/p TAVR. He is hemodynamically stable. Groin sites stable. ECG with sinus and old LBBB. There is no evidence of high grade block. Arterial line discontinued and transferred to 4E. Plan for early ambulation after bedrest completed and hopeful discharge over the next 24-48 hours.   Angelena Form PA-C  [facility]  Pager [phone]

## 2020-08-12 NOTE — Progress Notes (Signed)
Patient arrived to 4E18 from the cath lab. VSS and patient denies pain. Bilateral groin sites level 0 with dressing c/d/I. Patient placed on telemetry and CCMD notified. Bed low and locked with bed alarm engaged. Patient oriented to room and call bell left within reach.   Gailen Shelter RN

## 2020-08-13 ENCOUNTER — Inpatient Hospital Stay (HOSPITAL_COMMUNITY): Payer: Medicare Other

## 2020-08-13 ENCOUNTER — Other Ambulatory Visit: Payer: Self-pay

## 2020-08-13 ENCOUNTER — Encounter (HOSPITAL_COMMUNITY): Payer: Self-pay | Admitting: Cardiovascular Disease

## 2020-08-13 DIAGNOSIS — Z952 Presence of prosthetic heart valve: Secondary | ICD-10-CM

## 2020-08-13 LAB — CBC
HCT: 39.2 % (ref 39.0–52.0)
Hemoglobin: 13.2 g/dL (ref 13.0–17.0)
MCH: 34.3 pg — ABNORMAL HIGH (ref 26.0–34.0)
MCHC: 33.7 g/dL (ref 30.0–36.0)
MCV: 101.8 fL — ABNORMAL HIGH (ref 80.0–100.0)
Platelets: 156 10*3/uL (ref 150–400)
RBC: 3.85 MIL/uL — ABNORMAL LOW (ref 4.22–5.81)
RDW: 13.4 % (ref 11.5–15.5)
WBC: 10.6 10*3/uL — ABNORMAL HIGH (ref 4.0–10.5)
nRBC: 0 % (ref 0.0–0.2)

## 2020-08-13 LAB — ECHOCARDIOGRAM LIMITED
AR max vel: 2.63 cm2
AV Area VTI: 2.56 cm2
AV Area mean vel: 2.34 cm2
AV Mean grad: 8.8 mmHg
AV Peak grad: 17.6 mmHg
Ao pk vel: 2.1 m/s
Area-P 1/2: 5.66 cm2
Height: 67 in
Weight: 2667.99 oz

## 2020-08-13 LAB — BASIC METABOLIC PANEL
Anion gap: 7 (ref 5–15)
BUN: 28 mg/dL — ABNORMAL HIGH (ref 8–23)
CO2: 26 mmol/L (ref 22–32)
Calcium: 9.2 mg/dL (ref 8.9–10.3)
Chloride: 105 mmol/L (ref 98–111)
Creatinine, Ser: 1.52 mg/dL — ABNORMAL HIGH (ref 0.61–1.24)
GFR, Estimated: 47 mL/min — ABNORMAL LOW (ref >60)
Glucose, Bld: 112 mg/dL — ABNORMAL HIGH (ref 70–99)
Potassium: 4.5 mmol/L (ref 3.5–5.1)
Sodium: 138 mmol/L (ref 135–145)

## 2020-08-13 LAB — MAGNESIUM: Magnesium: 2.2 mg/dL (ref 1.7–2.4)

## 2020-08-13 LAB — GLUCOSE, CAPILLARY: Glucose-Capillary: 129 mg/dL — ABNORMAL HIGH (ref 70–99)

## 2020-08-13 MED ORDER — CLOPIDOGREL BISULFATE 75 MG PO TABS: 75.0000 mg | ORAL_TABLET | Freq: Every day | ORAL | 1 refills | Status: DC

## 2020-08-13 NOTE — Progress Notes (Signed)
CARDIAC REHAB PHASE I   Offered to walk with pt. Pt states independent ambulation with RN this morning. Pt states improvement in breathing since procedure. Reviewed site care and restrictions. Encouraged continued ambulation with emphasis on safety. Pt declines CRP II at this time.   [id number] Rufina Falco, RN BSN 08/13/2020 10:04 AM

## 2020-08-13 NOTE — Discharge Summary (Addendum)
[facility]VE TEAM  Discharge Summary    Patient ID: Daryl Webb MRN: [id number]; DOB: [birth year]  Admit date: 08/12/2020 Discharge date: 08/13/2020  Primary Care Provider: [name], [name], MD  Primary Cardiologist: [name], MD / [name] & [name] (TAVR)  Discharge Diagnoses    Principal Problem:   S/P TAVR (transcatheter aortic valve replacement) Active Problems:   Hypercholesterolemia   History of prostate cancer   Type 2 diabetes mellitus without complication, without long-term current use of insulin (HCC)   Dyslipidemia (high LDL; low HDL)   Essential hypertension   CKD (chronic kidney disease), stage III (HCC)   Severe aortic stenosis   Allergies No Known Allergies  Diagnostic Studies/Procedures    TAVR OPERATIVE NOTE     Date of Procedure:                08/12/2020   Preoperative Diagnosis:      Severe Aortic Stenosis   Postoperative Diagnosis:    Same   Procedure:        Transcatheter Aortic Valve Replacement - Percutaneous  Transfemoral Approach             Edwards Sapien 3 THV (size 29 mm, model # 9600TFX, serial # [id number])              Co-Surgeons:                        Gaye Pollack, MD and [name], MD   Anesthesiologist:                  Dr Ermalene Postin   Echocardiographer:              Dr Sallyanne Kuster   Pre-operative Echo Findings: Severe aortic stenosis Normal left ventricular systolic function   Post-operative Echo Findings: No paravalvular leak Normal/unchanged left ventricular systolic function  _____________   Echo 08/13/2020: completed but pending formal read at the time of discharge    History of Present Illness     Daryl Webb is a 75 y.o. male with a history of HTN, HLD, DMT2, CKD stage III, duodenal ulcer and severe aortic stenosis who presented to [facility] on 08/13/20 for planned TAVR.  He has been followed by [name] for moderate aortic stenosis that has  progressed over time. Most recent echo 03/26/20 showed a mild reduction in LV function with an EF 50-55%, moderate concentric LVH, G2DD, and severe AS with a mean gradient of 34 mm hg, peak gradient of 64 mm hg, AVA 0.81 cm2, DVI 0.26. He complained of dyspnea on exertion and benodpnea. [facility] 05/08/20 showed a widely patent left main, widely patent LAD with ostial stenosis of a small second diagonal branch, otherwise no significant obstruction in the LAD territory. There was a patent left circumflex with mild to moderate nonobstructive plaquing in the mid vessel and patent RCA with mild nonobstructive stenosis, except for a 70% PDA stenosis in a relatively small caliber PDA branch. Medical therapy was recommended. Of note, pre TAVR CT scans showed an indeterminate lesion in the interpolar region of the right kidney which has grown compared to prior study from 2017. Follow up MRI 5/1 showed an exophytic cyst of the lateral midportion of the right kidney measuring 2.1 cm. No suspicious mass or contrast enhancement. No further follow-up or characterization is required for this benign cyst.  The patient has been evaluated by the  multidisciplinary valve team and felt to have severe, symptomatic aortic stenosis and to be a suitable candidate for TAVR, which was set up for 08/12/20.   Hospital Course     Consultants: none   Severe AS: s/p successful TAVR with a 29 mm Edwards Sapien 3 THV via the TF approach on 08/12/20. Post operative echo pending formal read. Groin sites are stable. ECG with sinus with LAFB but no high grade heart block (previous ECGs preoperatively have showed ILBBB and LBBB). Continued on home Asprin and Plavix 75mg  daily added x 6 months. Plan for discharge home today with follow up in the office in 2 weeks.  DMT2: treated with SSI while admitted. Resume home meds at discharge. Okay to resume Metformin after 48 hours after contrast dye exposure (7/7 PM)   HTN: Bp mildy elevated. Resume home meds.    CKD stage III: creat has remained stable around baseline. Creat 1.52 at discharge   _____________  Discharge Vitals Blood pressure (!) 119/56, pulse 93, temperature 98.6 F (37 C), temperature source Oral, resp. rate 18, height 5\' 7"  (1.702 m), weight 75.6 kg, SpO2 96 %.  Filed Weights   08/12/20 1036 08/13/20 0507  Weight: 74.8 kg 75.6 kg    GEN: Well nourished, well developed, in no acute distress HEENT: normal Neck: no JVD or masses Cardiac: RRR; no murmurs, rubs, or gallops,no edema  Respiratory:  clear to auscultation bilaterally, normal work of breathing GI: soft, nontender, nondistended, + BS MS: no deformity or atrophy Skin: warm and dry, no rash.  Groin sites clear without hematoma or ecchymosis  Neuro:  Alert and Oriented x 3, Strength and sensation are intact Psych: euthymic mood, full affect  Labs & Radiologic Studies    CBC Recent Labs    08/12/20 1528 08/13/20 0235  WBC  --  10.6*  HGB 12.6* 13.2  HCT 37.0* 39.2  MCV  --  101.8*  PLT  --  182   Basic Metabolic Panel Recent Labs    08/12/20 1528 08/13/20 0235  NA 139 138  K 4.8 4.5  CL 106 105  CO2  --  26  GLUCOSE 166* 112*  BUN 26* 28*  CREATININE 1.40* 1.52*  CALCIUM  --  9.2  MG  --  2.2   Liver Function Tests No results for input(s): AST, ALT, ALKPHOS, BILITOT, PROT, ALBUMIN in the last 72 hours. No results for input(s): LIPASE, AMYLASE in the last 72 hours. Cardiac Enzymes No results for input(s): CKTOTAL, CKMB, CKMBINDEX, TROPONINI in the last 72 hours. BNP Invalid input(s): POCBNP D-Dimer No results for input(s): DDIMER in the last 72 hours. Hemoglobin A1C No results for input(s): HGBA1C in the last 72 hours. Fasting Lipid Panel No results for input(s): CHOL, HDL, LDLCALC, TRIG, CHOLHDL, LDLDIRECT in the last 72 hours. Thyroid Function Tests No results for input(s): TSH, T4TOTAL, T3FREE, THYROIDAB in the last 72 hours.  Invalid input(s): FREET3 _____________  DG Chest 2  View  Result Date: 08/11/2020 CLINICAL DATA:  Preop exam. Patient scheduled for transcatheter aortic valve replacement. EXAM: CHEST - 2 VIEW COMPARISON:  06/19/2012 FINDINGS: Heart size is normal. The lungs are free of focal consolidations and pleural effusions. No pulmonary edema. There is atherosclerotic calcification of the thoracic aorta. Midthoracic degenerative changes. IMPRESSION: No evidence for acute cardiopulmonary abnormality. Aortic atherosclerosis. (ICD10-I70.0) Electronically Signed   By: Nolon Nations M.D.   On: 08/11/2020 09:39   ECHOCARDIOGRAM LIMITED  Result Date: 08/12/2020    ECHOCARDIOGRAM LIMITED REPORT  Patient Name:   Daryl Webb Date of Exam: 08/12/2020 Medical Rec #:  [id number]            Height:       67.0 in Accession #:    [id number]           Weight:       165.0 lb Date of Birth:  May 04, 1945            BSA:          1.863 m Patient Age:    21 years             BP:           150/72 mmHg Patient Gender: M                    HR:           81 bpm. Exam Location:  Inpatient Procedure: Limited Echo, Cardiac Doppler and Color Doppler Indications:     I35.0 Nonrheumatic aortic (valve) stenosis  History:         Patient has prior history of Echocardiogram examinations, most                  recent 03/26/2020. Aortic Valve Disease, Signs/Symptoms:Murmur;                  Risk Factors:Hypertension, Diabetes and Dyslipidemia. Severe                  aortic stenosis.  Sonographer:     Roseanna Rainbow RDCS Referring Phys:  [place] Diagnosing Phys: Sanda Klein MD                   PREOPERATIVE FINDINGS;                   Mildly reduced left ventricular systolic function, Estimated                  LVEF 50%.                  No regional wall motion abnormalities.                  Trileaflet aortic valve with severe calcific changes. Severe                  aortic stenosis.                  Peak gradient 52 mm Hg, mean gradient 33 mm Hg, dimensionless                  valve index  0.23, estimated aortic valve area 0.87 cm.                  No aortic insufficiency.                  No mitral insufficiency.                  No pericardial effusion.                   PREOPERATIVE FINDINGS;                   Mildly reduced left ventricular systolic function, Estimated                  LVEF 50%.  No regional wall motion abnormalities.                  Well-seated aortic stent-valve (TAVR).                  Peak gradient 5 mm Hg, mean gradient 3 mm Hg, dimensionless                  valve index 0.97, estimated aortic valve area 3.68 cm.                  Acceleration time 82 ms.                  No aortic insufficiency.                  No mitral insufficiency.                  No pericardial effusion. IMPRESSIONS  1. Left ventricular ejection fraction, by estimation, is 45 to 50%. The left ventricle has mildly decreased function. The left ventricle has no regional wall motion abnormalities. Left ventricular diastolic function could not be evaluated.  2. Right ventricular systolic function is normal. The right ventricular size is normal.  3. The mitral valve is degenerative. No evidence of mitral valve regurgitation. No evidence of mitral stenosis.  4. The aortic valve is tricuspid. There is severe calcifcation of the aortic valve. There is severe thickening of the aortic valve. Aortic valve regurgitation is not visualized. Severe aortic valve stenosis. FINDINGS  Left Ventricle: Left ventricular ejection fraction, by estimation, is 45 to 50%. The left ventricle has mildly decreased function. The left ventricle has no regional wall motion abnormalities. The left ventricular internal cavity size was normal in size. There is no left ventricular hypertrophy. Left ventricular diastolic function could not be evaluated. Right Ventricle: The right ventricular size is normal. No increase in right ventricular wall thickness. Right ventricular systolic function is normal. Left Atrium: Left  atrial size was normal in size. Right Atrium: Right atrial size was normal in size. Pericardium: There is no evidence of pericardial effusion. Mitral Valve: The mitral valve is degenerative in appearance. Mild mitral annular calcification. No evidence of mitral valve stenosis. Tricuspid Valve: The tricuspid valve is normal in structure. Tricuspid valve regurgitation is not demonstrated. Aortic Valve: The aortic valve is tricuspid. There is severe calcifcation of the aortic valve. There is severe thickening of the aortic valve. Aortic valve regurgitation is not visualized. Severe aortic stenosis is present. Aortic valve mean gradient measures 33.0 mmHg. Aortic valve peak gradient measures 51.8 mmHg. Aortic valve area, by VTI measures 0.87 cm. Pulmonic Valve: The pulmonic valve was not well visualized. Pulmonic valve regurgitation is not visualized. Aorta: The aortic root is normal in size and structure. IAS/Shunts: The interatrial septum was not assessed. LEFT VENTRICLE PLAX 2D LVOT diam:     2.20 cm LV SV:         63 LV SV Index:   34 LVOT Area:     3.80 cm  LV Volumes (MOD) LV vol d, MOD A2C: 84.9 ml LV vol d, MOD A4C: 90.1 ml LV vol s, MOD A2C: 45.4 ml LV vol s, MOD A4C: 49.4 ml LV SV MOD A2C:     39.5 ml LV SV MOD A4C:     90.1 ml LV SV MOD BP:      39.6 ml AORTIC VALVE AV Area (Vmax):    0.78 cm AV Area (Vmean):  0.74 cm AV Area (VTI):     0.87 cm AV Vmax:           360.00 cm/s AV Vmean:          276.000 cm/s AV VTI:            0.730 m AV Peak Grad:      51.8 mmHg AV Mean Grad:      33.0 mmHg LVOT Vmax:         73.50 cm/s LVOT Vmean:        53.700 cm/s LVOT VTI:          0.167 m LVOT/AV VTI ratio: 0.23  SHUNTS Systemic VTI:  0.17 m Systemic Diam: 2.20 cm Sanda Klein MD Electronically signed by Sanda Klein MD Signature Date/Time: 08/12/2020/8:34:13 PM    Final    Structural Heart Procedure  Result Date: 08/12/2020 See surgical note for result.  Disposition   Pt is being discharged home today in  good condition.  Follow-up Plans & Appointments     Follow-up Information     Eileen Stanford, PA-C. Go on 08/28/2020.   Specialties: Cardiology, Radiology Why: @ 12:30pm, please arrive at least 10 minutes early. Contact information: [address] [place] [address] [phone]                  Discharge Medications   Allergies as of 08/13/2020   No Known Allergies      Medication List     STOP taking these medications    fluconazole 100 MG tablet Commonly known as: Diflucan       TAKE these medications    Accu-Chek SmartView test strip Generic drug: glucose blood Use to test blood sugars once daily   amoxicillin 500 MG capsule Commonly known as: AMOXIL Take 2,000 mg by mouth See admin instructions. Dental procedures   aspirin 81 MG tablet Take 81 mg by mouth daily.   atorvastatin 40 MG tablet Commonly known as: LIPITOR TAKE 1 TABLET DAILY What changed: how to take this   Bydureon BCise 2 MG/0.85ML Auij Generic drug: Exenatide ER Inject 2 mg into the skin once a week. Thursday   Cholecalciferol 50 MCG (2000 UT) Caps Take 2,000 Units by mouth 2 (two) times a week. VITAMIN D 3   clopidogrel 75 MG tablet Commonly known as: PLAVIX Take 1 tablet (75 mg total) by mouth daily with breakfast. Start taking on: August 14, 2020   clotrimazole 1 % cream Commonly known as: LOTRIMIN Apply 1 application topically 2 (two) times daily as needed (jock itch).   CoQ10 100 MG Caps Take 100 mg by mouth 2 (two) times a week.   [employer] CINNAMON PO Take 1,200 mg by mouth daily.   cyanocobalamin 1000 MCG/ML injection Commonly known as: (VITAMIN B-12) Inject 1,000 mcg into the muscle every 30 (thirty) days.   dapagliflozin propanediol 5 MG Tabs tablet Commonly known as: FARXIGA Take 5 mg by mouth daily.   diclofenac 75 MG EC tablet Commonly known as: VOLTAREN TAKE 1 TABLET DAILY AS     NEEDED What changed: when to take this   Fish Oil  1000 MG Caps Take 1,000 mg by mouth 2 (two) times a week.   loratadine 10 MG tablet Commonly known as: CLARITIN Take 5 mg by mouth daily as needed for allergies.   losartan-hydrochlorothiazide 50-12.5 MG tablet Commonly known as: HYZAAR TAKE 1 TABLET DAILY   metFORMIN 500 MG tablet Commonly known as: GLUCOPHAGE  Take 500 mg by mouth 2 (two) times daily. Notes to patient: Okay to resume Metformin after 48 hours after contrast dye exposure (7/7 PM)    metoprolol succinate 50 MG 24 hr tablet Commonly known as: TOPROL-XL TAKE 1 TABLET DAILY   nystatin ointment Commonly known as: MYCOSTATIN Apply 1 application topically 3 (three) times daily.   OLIVE LEAF EXTRACT PO Take 600 mg by mouth daily.   QUERCETIN PO Take 500 mg by mouth daily.   vitamin C 1000 MG tablet Take 1,000 mg by mouth daily.       ASK your doctor about these medications    amLODipine 5 MG tablet Commonly known as: NORVASC TAKE 1 TABLET DAILY            Outstanding Labs/Studies   none  Duration of Discharge Encounter   Greater than 30 minutes including physician time.  Mable Fill, PA-C 08/13/2020, 9:14 AM [phone]  Patient seen, examined. Available data reviewed. Agree with findings, assessment, and plan as outlined by Nell Range, PA-C. The patient is examined with Ms Grandville Silos the morning of 7/6. He is alert, oriented, in NAD. Lungs CTA, heart RRR with soft systolic murmur at the RUSB, no diastolic murmur, abdomen soft, NT. Extremities with no edema. BL groin sites are clear. Tele shows NSR with no AV block or arrhythmia. Echo shows normal TAVR valve function and no significant paravalvular leak. The patient is medically stable for discharge today. He has walked the halls and feels well. Post-TAVR instructions and follow-up plan reviewed with him.   [name], M.D. 08/14/2020 1:39 PM

## 2020-08-13 NOTE — Anesthesia Postprocedure Evaluation (Signed)
Anesthesia Post Note  Patient: Derran Sear Godbey  Procedure(s) Performed: TRANSCATHETER AORTIC VALVE REPLACEMENT, TRANSFEMORAL TRANSESOPHAGEAL ECHOCARDIOGRAM (TEE)     Patient location during evaluation: Cath Lab Anesthesia Type: MAC Level of consciousness: patient cooperative and awake Pain management: pain level controlled Vital Signs Assessment: post-procedure vital signs reviewed and stable Respiratory status: spontaneous breathing, nonlabored ventilation, respiratory function stable and patient connected to nasal cannula oxygen Cardiovascular status: stable and blood pressure returned to baseline Postop Assessment: no apparent nausea or vomiting Anesthetic complications: no   No notable events documented.  Last Vitals:  Vitals:   08/13/20 0852 08/13/20 1251  BP: (!) 119/56 (!) 151/86  Pulse: 93 92  Resp: 18 20  Temp: 37 C   SpO2: 96% 97%    Last Pain:  Vitals:   08/13/20 1251  TempSrc: Oral  PainSc:                  Mansi Tokar

## 2020-08-13 NOTE — Progress Notes (Signed)
  Echocardiogram 2D Echocardiogram limited has been performed.  Darlina Sicilian M 08/13/2020, 9:09 AM

## 2020-08-14 ENCOUNTER — Other Ambulatory Visit: Payer: Self-pay | Admitting: Internal Medicine

## 2020-08-14 ENCOUNTER — Telehealth: Payer: Self-pay | Admitting: Physician Assistant

## 2020-08-14 NOTE — Telephone Encounter (Signed)
  [place] VALVE TEAM   Patient contacted regarding discharge from [facility] on 7/6  Patient understands to follow up with provider Nell Range on 7/21 at [place].  Patient understands discharge instructions? yes Patient understands medications and regimen? yes Patient understands to bring all medications to this visit? yes  Angelena Form PA-C  [facility]

## 2020-08-28 ENCOUNTER — Ambulatory Visit (INDEPENDENT_AMBULATORY_CARE_PROVIDER_SITE_OTHER): Payer: Medicare Other | Admitting: Physician Assistant

## 2020-08-28 ENCOUNTER — Encounter: Payer: Self-pay | Admitting: Physician Assistant

## 2020-08-28 ENCOUNTER — Other Ambulatory Visit: Payer: Self-pay

## 2020-08-28 VITALS — BP 112/70 | HR 86 | Ht 67.0 in | Wt 169.0 lb

## 2020-08-28 DIAGNOSIS — I2510 Atherosclerotic heart disease of native coronary artery without angina pectoris: Secondary | ICD-10-CM

## 2020-08-28 DIAGNOSIS — E119 Type 2 diabetes mellitus without complications: Secondary | ICD-10-CM | POA: Diagnosis not present

## 2020-08-28 DIAGNOSIS — N1832 Chronic kidney disease, stage 3b: Secondary | ICD-10-CM

## 2020-08-28 DIAGNOSIS — I10 Essential (primary) hypertension: Secondary | ICD-10-CM | POA: Diagnosis not present

## 2020-08-28 DIAGNOSIS — Z952 Presence of prosthetic heart valve: Secondary | ICD-10-CM | POA: Diagnosis not present

## 2020-08-28 NOTE — Patient Instructions (Signed)
Medication Instructions:  Your physician recommends that you continue on your current medications as directed. Please refer to the Current Medication list given to you today.  *If you need a refill on your cardiac medications before your next appointment, please call your pharmacy*   Lab Work: None ordered  If you have labs (blood work) drawn today and your tests are completely normal, you will receive your results only by: [place] (if you have MyChart) OR A paper copy in the mail If you have any lab test that is abnormal or we need to change your treatment, we will call you to review the results.   Testing/Procedures: None ordered   Follow-Up: At [facility], you and your health needs are our priority.  As part of our continuing mission to provide you with exceptional heart care, we have created designated Provider Care Teams.  These Care Teams include your primary Cardiologist (physician) and Advanced Practice Providers (APPs -  Physician Assistants and Nurse Practitioners) who all work together to provide you with the care you need, when you need it.  We recommend signing up for the patient portal called "MyChart".  Sign up information is provided on this After Visit Summary.  MyChart is used to connect with patients for Virtual Visits (Telemedicine).  Patients are able to view lab/test results, encounter notes, upcoming appointments, etc.  Non-urgent messages can be sent to your provider as well.   To learn more about what you can do with MyChart, go to [email or web address].    Your next appointment:   Already scheduled  The format for your next appointment:     Provider:      Other Instructions

## 2020-08-28 NOTE — Progress Notes (Signed)
HE[facility] AND [place]                                     Cardiology Office Note:    Date:  08/28/2020   ID:  Daryl Webb, DOB Feb 17, 1945, MRN [id number]  PCP:  [name], [name], MD  [facility] Cardiologist:  [name], MD / [name] & [name] (TAVR) [place] Electrophysiologist:  None   Referring MD: [name], [name], MD   Follow up after TAVR  History of Present Illness:    Daryl Webb is a 75 y.o. male with a hx of HTN, HLD, DMT2, CKD stage III, duodenal ulcer and severe aortic stenosis s/p TAVR (08/13/20) who presents to clinic for follow up.   He has been followed by [name] for moderate aortic stenosis that has progressed over time. Most recent echo 03/26/20 showed a mild reduction in LV function with an EF 50-55%, moderate concentric LVH, G2DD, and severe AS with a mean gradient of 34 mm hg, peak gradient of 64 mm hg, AVA 0.81 cm2, DVI 0.26. He complained of dyspnea on exertion and benodpnea. [facility] 05/08/20 showed a widely patent left main, widely patent LAD with ostial stenosis of a small second diagonal branch, otherwise no significant obstruction in the LAD territory. There was a patent left circumflex with mild to moderate nonobstructive plaquing in the mid vessel and patent RCA with mild nonobstructive stenosis, except for a 70% PDA stenosis in a relatively small caliber PDA branch. Medical therapy was recommended. Of note, pre TAVR CT scans showed an indeterminate lesion in the interpolar region of the right kidney which has grown compared to prior study from 2017. Follow up MRI 5/1 showed an exophytic cyst of the lateral midportion of the right kidney measuring 2.1 cm. No suspicious mass or contrast enhancement. No further follow-up or characterization is required for this benign cyst.   He was evaluated by the multidisciplinary valve team and underwent successful TAVR with a 29 mm Edwards Sapien 3 THV  via the TF approach on 08/12/20. Post operative echo showed EF 50%, normally functioning TAVR with a mean gradient of 8.8 mmHg and no PVL. He was continued on home Asprin and Plavix '75mg'$  daily added x 6 months. He was discharged on POD 1 after an uncomplicated hospital course.   Today the patient presents to clinic for follow up. He is doing great with no issues. He is very pleased with all his care at [facility] No CP or SOB. No LE edema, orthopnea or PND. No dizziness or syncope. No blood in stool or urine. No palpitations.  He feels like he has slowed down over time and wants to start exercising again.  He initially declined cardiac rehab, but now he is interested.  He has had some dysuria and urinary frequency and plans to call his urologist after our visit.  He also had a groin yeast infection prior to his TAVR that was treated with fluconazole and nystatin.  This has resolved but he worries about recurrence with restarting [place].   Past Medical History:  Diagnosis Date   Anemia    Arthritis    CKD (chronic kidney disease)    Constipation due to pain medication    needs stool softner while on pain medication   Diabetes mellitus    Duodenal ulcer 08/13/2015  2014  - Dr Kaplan/GI   H/O: osteoarthritis    History of kidney stones    History of prostate cancer    History of urethral stricture    Hypercholesterolemia    Hypertension    Prostate cancer ([place])    prostate   S/P TAVR (transcatheter aortic valve replacement) 08/12/2020   s/p TAVR with a 29 mm Edwards Sapien 3 THV via the TF approach by [name] and [name]   Severe aortic stenosis    Spinal stenosis    Ulcer duodenal hemorrhage     Past Surgical History:  Procedure Laterality Date   ARTHROPLASTY  2004   right knee Dr. Wynelle Link   BACK SURGERY     CARPAL TUNNEL RELEASE     right hand,wrist [name]   CYSTOSCOPY N/A 09/20/2012   Procedure: Consuela Mimes;  Surgeon: [name], MD;  Location: [place] [facility];  Service:  Urology;  Laterality: N/A;   JOINT REPLACEMENT Right    KNEE ARTHROSCOPY Right    LUMBAR LAMINECTOMY  02/08/2013   PROSTATECTOMY     radical   RIGHT/LEFT HEART CATH AND CORONARY ANGIOGRAPHY N/A 05/08/2020   Procedure: RIGHT/LEFT HEART CATH AND CORONARY ANGIOGRAPHY;  Surgeon: [name], MD;  Location: [place] [facility];  Service: Cardiovascular;  Laterality: N/A;   TEE WITHOUT CARDIOVERSION N/A 08/12/2020   Procedure: TRANSESOPHAGEAL ECHOCARDIOGRAM (TEE);  Surgeon: [name], MD;  Location: [place] [facility];  Service: Open Heart Surgery;  Laterality: N/A;   TRANSCATHETER AORTIC VALVE REPLACEMENT, TRANSFEMORAL N/A 08/12/2020   Procedure: TRANSCATHETER AORTIC VALVE REPLACEMENT, TRANSFEMORAL;  Surgeon: [name], MD;  Location: [place] [facility];  Service: Open Heart Surgery;  Laterality: N/A;   VASECTOMY  1985    Current Medications: Current Meds  Medication Sig   amLODipine (NORVASC) 5 MG tablet TAKE 1 TABLET DAILY   Ascorbic Acid (VITAMIN C) 1000 MG tablet Take 1,000 mg by mouth daily.   aspirin 81 MG tablet Take 81 mg by mouth daily.   atorvastatin (LIPITOR) 40 MG tablet TAKE 1 TABLET DAILY   BYDUREON BCISE 2 MG/0.85ML AUIJ Inject 2 mg into the skin once a week. Thursday   Cholecalciferol 2000 UNITS CAPS Take 2,000 Units by mouth 2 (two) times a week. VITAMIN D 3   clopidogrel (PLAVIX) 75 MG tablet Take 1 tablet (75 mg total) by mouth daily with breakfast.   Coenzyme Q10 (COQ10) 100 MG CAPS Take 100 mg by mouth 2 (two) times a week.   [employer] CINNAMON PO Take 1,200 mg by mouth daily.   cyanocobalamin (,VITAMIN B-12,) 1000 MCG/ML injection Inject 1,000 mcg into the muscle every 30 (thirty) days.   diclofenac (VOLTAREN) 75 MG EC tablet TAKE 1 TABLET DAILY AS     NEEDED   loratadine (CLARITIN) 10 MG tablet Take 5 mg by mouth daily as needed for allergies.   losartan-hydrochlorothiazide (HYZAAR) 50-12.5 MG tablet TAKE 1 TABLET DAILY   metFORMIN (GLUCOPHAGE) 500 MG tablet Take  500 mg by mouth 2 (two) times daily.   metoprolol succinate (TOPROL-XL) 50 MG 24 hr tablet TAKE 1 TABLET DAILY   nystatin ointment (MYCOSTATIN) Apply 1 application topically 3 (three) times daily.   OLIVE LEAF EXTRACT PO Take 600 mg by mouth daily.   Omega-3 Fatty Acids (FISH OIL) 1000 MG CAPS Take 1,000 mg by mouth 2 (two) times a week.   QUERCETIN PO Take 500 mg by mouth daily.     Allergies:   Patient has no known  allergies.   Social History   Socioeconomic History   Marital status: Married    Spouse name: Not on file   Number of children: 2   Years of education: Not on file   Highest education level: Bachelor's degree (e.g., BA, AB, BS)  Occupational History   Occupation: Retired  Tobacco Use   Smoking status: Former    Packs/day: 1.50    Years: 20.00    Pack years: 30.00    Types: Cigarettes    Quit date: 02/09/1980    Years since quitting: 40.5   Smokeless tobacco: Never  Vaping Use   Vaping Use: Never used  Substance and Sexual Activity   Alcohol use: No   Drug use: No   Sexual activity: Not Currently  Other Topics Concern   Not on file  Social History Narrative   Retired from [employer] ([employer]).    Lives with wife,    two children in [place].    Enjoys reading, boating, travelling to [place] in the winter with wife.   Social Determinants of Health   Financial Resource Strain: Not on file  Food Insecurity: Not on file  Transportation Needs: Not on file  Physical Activity: Not on file  Stress: Not on file  Social Connections: Not on file     Family History: The patient's family history includes Diabetes in his father; Heart disease in his father and mother; Heart failure in his mother.  ROS:   Please see the history of present illness.    All other systems reviewed and are negative.  EKGs/Labs/Other Studies Reviewed:    The following studies were reviewed today:  TAVR OPERATIVE NOTE     Date of Procedure:                08/12/2020    Preoperative Diagnosis:      Severe Aortic Stenosis   Postoperative Diagnosis:    Same   Procedure:        Transcatheter Aortic Valve Replacement - Percutaneous  Transfemoral Approach             Edwards Sapien 3 THV (size 29 mm, model # 9600TFX, serial # [id number])              Co-Surgeons:                        Gaye Pollack, MD and [name], MD   Anesthesiologist:                  Dr Ermalene Postin   Echocardiographer:              Dr Sallyanne Kuster   Pre-operative Echo Findings: Severe aortic stenosis Normal left ventricular systolic function   Post-operative Echo Findings: No paravalvular leak Normal/unchanged left ventricular systolic function   _____________    Echo 08/13/2020: IMPRESSIONS  1. Left ventricular ejection fraction, by estimation, is 50 to 55%. The  left ventricle has low normal function. The left ventricle has no regional  wall motion abnormalities. There is mild concentric left ventricular  hypertrophy. Left ventricular  diastolic parameters are consistent with Grade I diastolic dysfunction  (impaired relaxation).   2. Right ventricular systolic function is normal. The right ventricular  size is normal.   3. The mitral valve is normal in structure. No evidence of mitral valve  regurgitation. No evidence of mitral stenosis.   4. The aortic valve is normal in structure. Aortic valve regurgitation is  not visualized. No aortic stenosis is present. There is a 29 mm Sapien  prosthetic (TAVR) valve present in the aortic position. Procedure Date:  08/12/2020. Aortic valve mean gradient measures 8.8 mmHg. Aortic valve Vmax measures 2.10 m/s. Aortic valve acceleration time measures 61 msec.   5. The inferior vena cava is normal in size with greater than 50%  respiratory variability, suggesting right atrial pressure of 3 mmHg.  EKG:  EKG is ordered today.  The ekg ordered today demonstrates sinus HR 86  Recent Labs: 08/08/2020: ALT 24 08/13/2020: BUN 28; Creatinine,  Ser 1.52; Hemoglobin 13.2; Magnesium 2.2; Platelets 156; Potassium 4.5; Sodium 138  Recent Lipid Panel    Component Value Date/Time   CHOL 94 06/28/2017 0915   TRIG 112.0 06/28/2017 0915   HDL 27.90 (L) 06/28/2017 0915   CHOLHDL 3 06/28/2017 0915   VLDL 22.4 06/28/2017 0915   LDLCALC 43 06/28/2017 0915   LDLDIRECT 73.8 11/20/2012 0840     Risk Assessment/Calculations:       Physical Exam:    VS:  BP 112/70   Pulse 86   Ht '5\' 7"'$  (1.702 m)   Wt 169 lb (76.7 kg)   BMI 26.47 kg/m     Wt Readings from Last 3 Encounters:  08/28/20 169 lb (76.7 kg)  08/13/20 166 lb 12 oz (75.6 kg)  08/08/20 168 lb (76.2 kg)     GEN:  Well nourished, well developed in no acute distress HEENT: Normal NECK: No JVD; No carotid bruits LYMPHATICS: No lymphadenopathy CARDIAC: RRR, no murmurs, rubs, gallops RESPIRATORY:  Clear to auscultation without rales, wheezing or rhonchi  ABDOMEN: Soft, non-tender, non-distended MUSCULOSKELETAL:  No edema; No deformity  SKIN: Warm and dry NEUROLOGIC:  Alert and oriented x 3 PSYCHIATRIC:  Normal affect   ASSESSMENT:    1. S/P TAVR (transcatheter aortic valve replacement)   2. Essential hypertension   3. Stage 3b chronic kidney disease ([place])   4. Type 2 diabetes mellitus without complication, without long-term current use of insulin ([place])   5. Coronary artery disease involving native heart without angina pectoris, unspecified vessel or lesion type    PLAN:    In order of problems listed above:  Severe AS s/p TAVR: doing well.  Groin sites are healing well.  ECG with no HAVB.  SBE prophylaxis discussed; he has amoxicillin.  Continue on aspirin and Plavix.  He would like to become more active and is now considering cardiac rehab.  He initially declined this in the hospital so I will place an order under [name] today.  I will see him back for 1 month follow-up with echo.  HTN: Blood pressure well controlled.  No changes made   CKD stage III:  Creatinine has remained stable around 1.5.  DMT2: put on Farxiga by endocrinologist. Has had a groin yeast infections and UTI.  I wonder if this is related to [place].  He has not been taking it because he wanted to clear it with me.  He currently has some symptoms of dysuria and frequency and plans to follow-up with his urologist today.  He is worried about recurrent yeast infections in his groin area.  I told him it would be reasonable to restart the medicine but if he continues to have issues with ongoing yeast infections and UTIs, it may not be a good medication for him long-term.  CAD: pre TAVR cath showed a widely patent left main, widely patent LAD with ostial stenosis of a small second  diagonal branch, otherwise no significant obstruction in the LAD territory. There was a patent left circumflex with mild to moderate nonobstructive plaquing in the mid vessel and patent RCA with mild nonobstructive stenosis, except for a 70% PDA stenosis in a relatively small caliber PDA branch. Medical therapy was recommended.    Cardiac Rehabilitation Eligibility Assessment: The patient is ready to start Cardiac Rehab from a cardiac standpoint.    Medication Adjustments/Labs and Tests Ordered: Current medicines are reviewed at length with the patient today.  Concerns regarding medicines are outlined above.  Orders Placed This Encounter  Procedures   Amb Referral to Cardiac Rehabilitation   EKG 12-Lead    No orders of the defined types were placed in this encounter.   Patient Instructions  Medication Instructions:  Your physician recommends that you continue on your current medications as directed. Please refer to the Current Medication list given to you today.  *If you need a refill on your cardiac medications before your next appointment, please call your pharmacy*   Lab Work: None ordered  If you have labs (blood work) drawn today and your tests are completely normal, you will receive your  results only by: [place] (if you have MyChart) OR A paper copy in the mail If you have any lab test that is abnormal or we need to change your treatment, we will call you to review the results.   Testing/Procedures: None ordered   Follow-Up: At [facility], you and your health needs are our priority.  As part of our continuing mission to provide you with exceptional heart care, we have created designated Provider Care Teams.  These Care Teams include your primary Cardiologist (physician) and Advanced Practice Providers (APPs -  Physician Assistants and Nurse Practitioners) who all work together to provide you with the care you need, when you need it.  We recommend signing up for the patient portal called "MyChart".  Sign up information is provided on this After Visit Summary.  MyChart is used to connect with patients for Virtual Visits (Telemedicine).  Patients are able to view lab/test results, encounter notes, upcoming appointments, etc.  Non-urgent messages can be sent to your provider as well.   To learn more about what you can do with MyChart, go to [email or web address].    Your next appointment:   Already scheduled  The format for your next appointment:     Provider:      Other Instructions    Signed, Angelena Form, PA-C  08/28/2020 4:22 PM    [place]

## 2020-08-28 NOTE — Addendum Note (Signed)
Addended by: Eileen Stanford on: 08/28/2020 04:27 PM   Modules accepted: Orders

## 2020-08-29 ENCOUNTER — Encounter (HOSPITAL_COMMUNITY): Payer: Self-pay | Admitting: *Deleted

## 2020-08-29 NOTE — Progress Notes (Signed)
Received referral from [name] for this pt to participate in Cardiac Rehab s/p 08/12/20 TAVR.  Pt initially declined to participate and has changed his mind. Clinical review of pt follow up appt on 7/21 with Marita Snellen PA with [name] [facility] - cardiologist office note. Pt is making the expected progress in recovery.  Pt appropriate for scheduling for on site cardiac rehab and/or enrollment in Virtual Cardiac Rehab.  Pt Covid Risk Score is 6  Will forward to staff for follow up. Cherre Huger, BSN Cardiac and Training and development officer

## 2020-09-02 DIAGNOSIS — R8279 Other abnormal findings on microbiological examination of urine: Secondary | ICD-10-CM | POA: Diagnosis not present

## 2020-09-02 DIAGNOSIS — R300 Dysuria: Secondary | ICD-10-CM | POA: Diagnosis not present

## 2020-09-04 DIAGNOSIS — L720 Epidermal cyst: Secondary | ICD-10-CM | POA: Diagnosis not present

## 2020-09-04 DIAGNOSIS — Z85828 Personal history of other malignant neoplasm of skin: Secondary | ICD-10-CM | POA: Diagnosis not present

## 2020-09-04 DIAGNOSIS — D1801 Hemangioma of skin and subcutaneous tissue: Secondary | ICD-10-CM | POA: Diagnosis not present

## 2020-09-04 DIAGNOSIS — L814 Other melanin hyperpigmentation: Secondary | ICD-10-CM | POA: Diagnosis not present

## 2020-09-04 DIAGNOSIS — L821 Other seborrheic keratosis: Secondary | ICD-10-CM | POA: Diagnosis not present

## 2020-09-04 DIAGNOSIS — L570 Actinic keratosis: Secondary | ICD-10-CM | POA: Diagnosis not present

## 2020-09-08 ENCOUNTER — Encounter: Payer: Self-pay | Admitting: Gastroenterology

## 2020-09-10 DIAGNOSIS — E538 Deficiency of other specified B group vitamins: Secondary | ICD-10-CM | POA: Diagnosis not present

## 2020-09-11 ENCOUNTER — Other Ambulatory Visit (HOSPITAL_COMMUNITY): Payer: Medicare Other

## 2020-09-11 ENCOUNTER — Encounter: Payer: Self-pay | Admitting: Physician Assistant

## 2020-09-11 ENCOUNTER — Ambulatory Visit (INDEPENDENT_AMBULATORY_CARE_PROVIDER_SITE_OTHER): Payer: Medicare Other | Admitting: Physician Assistant

## 2020-09-11 VITALS — BP 128/60 | HR 92 | Ht 67.0 in | Wt 164.2 lb

## 2020-09-11 DIAGNOSIS — N1832 Chronic kidney disease, stage 3b: Secondary | ICD-10-CM | POA: Diagnosis not present

## 2020-09-11 DIAGNOSIS — I10 Essential (primary) hypertension: Secondary | ICD-10-CM

## 2020-09-11 DIAGNOSIS — Z952 Presence of prosthetic heart valve: Secondary | ICD-10-CM | POA: Diagnosis not present

## 2020-09-11 DIAGNOSIS — I2510 Atherosclerotic heart disease of native coronary artery without angina pectoris: Secondary | ICD-10-CM

## 2020-09-11 NOTE — Progress Notes (Signed)
[facility] [place]                                     Cardiology Office Note:    Date:  09/11/2020   ID:  Daryl Webb, DOB Nov 01, 1945, MRN [id number]  PCP:  [name], [name], MD  [facility] Cardiologist:  [name], MD / [name] & [name] (TAVR) [place] Electrophysiologist:  None   Referring MD: [name], [name], MD   1 month s/p TAVR  History of Present Illness:    Daryl Webb is a 75 y.o. male with a hx of HTN, HLD, DMT2, CKD stage III, duodenal ulcer and severe aortic stenosis s/p TAVR (08/13/20) who presents to clinic for follow up.   He has been followed by [name] for moderate aortic stenosis that has progressed over time. Most recent echo 03/26/20 showed a mild reduction in LV function with an EF 50-55%, moderate concentric LVH, G2DD, and severe AS with a mean gradient of 34 mm hg, peak gradient of 64 mm hg, AVA 0.81 cm2, DVI 0.26. He complained of dyspnea on exertion and benodpnea. [facility] 05/08/20 showed a widely patent left main, widely patent LAD with ostial stenosis of a small second diagonal branch, otherwise no significant obstruction in the LAD territory. There was a patent left circumflex with mild to moderate nonobstructive plaquing in the mid vessel and patent RCA with mild nonobstructive stenosis, except for a 70% PDA stenosis in a relatively small caliber PDA branch. Medical therapy was recommended. Of note, pre TAVR CT scans showed an indeterminate lesion in the interpolar region of the right kidney which has grown compared to prior study from 2017. Follow up MRI 5/1 showed an exophytic cyst of the lateral midportion of the right kidney measuring 2.1 cm. No suspicious mass or contrast enhancement. No further follow-up or characterization is required for this benign cyst.   He was evaluated by the multidisciplinary valve team and underwent successful TAVR with a 29 mm Edwards Sapien 3 THV via  the TF approach on 08/12/20. Post operative echo showed EF 50%, normally functioning TAVR with a mean gradient of 8.8 mmHg and no PVL. He was continued on home Asprin and Plavix '75mg'$  daily added x 6 months. He was discharged on POD 1 after an uncomplicated hospital course. He has done well in follow up. Has had some issues with UTIs and yeast infections which he worried may be related to [place].   Today the patient presents to clinic for follow up. Back on farixga and no recurrent yeast. Also now on doxy for a UTI. He cannot tell a big difference in his shortness of breath or fatigue since his TAVR. Has not been sleeping well, which may contribute. No LE edema, orthopnea or PND. No dizziness or syncope. Wanting to exercise more.    Past Medical History:  Diagnosis Date   Anemia    Arthritis    CKD (chronic kidney disease)    Constipation due to pain medication    needs stool softner while on pain medication   Diabetes mellitus    Duodenal ulcer 08/13/2015   2014  - Dr Kaplan/GI   H/O: osteoarthritis    History of kidney stones    History of prostate cancer    History of urethral stricture    Hypercholesterolemia    Hypertension  Prostate cancer [facility])    prostate   S/P TAVR (transcatheter aortic valve replacement) 08/12/2020   s/p TAVR with a 29 mm Edwards Sapien 3 THV via the TF approach by [name] and [name]   Severe aortic stenosis    Spinal stenosis    Ulcer duodenal hemorrhage     Past Surgical History:  Procedure Laterality Date   ARTHROPLASTY  2004   right knee Dr. Wynelle Link   BACK SURGERY     CARPAL TUNNEL RELEASE     right hand,wrist [name]   CYSTOSCOPY N/A 09/20/2012   Procedure: Consuela Mimes;  Surgeon: [name], MD;  Location: [place] NEURO ORS;  Service: Urology;  Laterality: N/A;   JOINT REPLACEMENT Right    KNEE ARTHROSCOPY Right    LUMBAR LAMINECTOMY  02/08/2013   PROSTATECTOMY     radical   RIGHT/LEFT HEART CATH AND CORONARY ANGIOGRAPHY N/A 05/08/2020    Procedure: RIGHT/LEFT HEART CATH AND CORONARY ANGIOGRAPHY;  Surgeon: [name], MD;  Location: [place] [facility];  Service: Cardiovascular;  Laterality: N/A;   TEE WITHOUT CARDIOVERSION N/A 08/12/2020   Procedure: TRANSESOPHAGEAL ECHOCARDIOGRAM (TEE);  Surgeon: [name], MD;  Location: [place] [facility];  Service: Open Heart Surgery;  Laterality: N/A;   TRANSCATHETER AORTIC VALVE REPLACEMENT, TRANSFEMORAL N/A 08/12/2020   Procedure: TRANSCATHETER AORTIC VALVE REPLACEMENT, TRANSFEMORAL;  Surgeon: [name], MD;  Location: [place] [facility];  Service: Open Heart Surgery;  Laterality: N/A;   VASECTOMY  1985    Current Medications: Current Meds  Medication Sig   ACCU-CHEK SMARTVIEW test strip Use to test blood sugars once daily   amLODipine (NORVASC) 5 MG tablet TAKE 1 TABLET DAILY   amoxicillin (AMOXIL) 500 MG capsule Take 2,000 mg by mouth See admin instructions. Dental procedures   Ascorbic Acid (VITAMIN C) 1000 MG tablet Take 1,000 mg by mouth daily.   aspirin 81 MG tablet Take 81 mg by mouth daily.   atorvastatin (LIPITOR) 40 MG tablet TAKE 1 TABLET DAILY   BYDUREON BCISE 2 MG/0.85ML AUIJ Inject 2 mg into the skin once a week. Thursday   Cholecalciferol 2000 UNITS CAPS Take 2,000 Units by mouth 2 (two) times a week. VITAMIN D 3   clopidogrel (PLAVIX) 75 MG tablet Take 1 tablet (75 mg total) by mouth daily with breakfast.   clotrimazole (LOTRIMIN) 1 % cream Apply 1 application topically 2 (two) times daily as needed (jock itch).   Coenzyme Q10 (COQ10) 100 MG CAPS Take 100 mg by mouth 2 (two) times a week.   [employer] CINNAMON PO Take 1,200 mg by mouth daily.   cyanocobalamin (,VITAMIN B-12,) 1000 MCG/ML injection Inject 1,000 mcg into the muscle every 30 (thirty) days.   dapagliflozin propanediol (FARXIGA) 5 MG TABS tablet Take 5 mg by mouth daily.   diclofenac (VOLTAREN) 75 MG EC tablet TAKE 1 TABLET DAILY AS     NEEDED   loratadine (CLARITIN) 10 MG tablet Take 5 mg by mouth  daily as needed for allergies.   losartan-hydrochlorothiazide (HYZAAR) 50-12.5 MG tablet TAKE 1 TABLET DAILY   metFORMIN (GLUCOPHAGE) 500 MG tablet Take 500 mg by mouth 2 (two) times daily.   metoprolol succinate (TOPROL-XL) 50 MG 24 hr tablet TAKE 1 TABLET DAILY   nystatin ointment (MYCOSTATIN) Apply 1 application topically 3 (three) times daily.   OLIVE LEAF EXTRACT PO Take 600 mg by mouth daily.   Omega-3 Fatty Acids (FISH OIL) 1000 MG CAPS Take 1,000 mg by mouth 2 (two) times  a week.   QUERCETIN PO Take 500 mg by mouth daily.     Allergies:   Patient has no known allergies.   Social History   Socioeconomic History   Marital status: Married    Spouse name: Not on file   Number of children: 2   Years of education: Not on file   Highest education level: Bachelor's degree (e.g., BA, AB, BS)  Occupational History   Occupation: Retired  Tobacco Use   Smoking status: Former    Packs/day: 1.50    Years: 20.00    Pack years: 30.00    Types: Cigarettes    Quit date: 02/09/1980    Years since quitting: 40.6   Smokeless tobacco: Never  Vaping Use   Vaping Use: Never used  Substance and Sexual Activity   Alcohol use: No   Drug use: No   Sexual activity: Not Currently  Other Topics Concern   Not on file  Social History Narrative   Retired from [employer] ([employer]).    Lives with wife,    two children in [place].    Enjoys reading, boating, travelling to [place] in the winter with wife.   Social Determinants of Health   Financial Resource Strain: Not on file  Food Insecurity: Not on file  Transportation Needs: Not on file  Physical Activity: Not on file  Stress: Not on file  Social Connections: Not on file     Family History: The patient's family history includes Diabetes in his father; Heart disease in his father and mother; Heart failure in his mother.  ROS:   Please see the history of present illness.    All other systems reviewed and are  negative.  EKGs/Labs/Other Studies Reviewed:    The following studies were reviewed today:  TAVR OPERATIVE NOTE     Date of Procedure:                08/12/2020   Preoperative Diagnosis:      Severe Aortic Stenosis   Postoperative Diagnosis:    Same   Procedure:        Transcatheter Aortic Valve Replacement - Percutaneous  Transfemoral Approach             Edwards Sapien 3 THV (size 29 mm, model # 9600TFX, serial # [id number])              Co-Surgeons:                        Gaye Pollack, MD and [name], MD   Anesthesiologist:                  Dr Ermalene Postin   Echocardiographer:              Dr Sallyanne Kuster   Pre-operative Echo Findings: Severe aortic stenosis Normal left ventricular systolic function   Post-operative Echo Findings: No paravalvular leak Normal/unchanged left ventricular systolic function   _____________    Echo 08/13/2020: IMPRESSIONS  1. Left ventricular ejection fraction, by estimation, is 50 to 55%. The  left ventricle has low normal function. The left ventricle has no regional  wall motion abnormalities. There is mild concentric left ventricular  hypertrophy. Left ventricular  diastolic parameters are consistent with Grade I diastolic dysfunction  (impaired relaxation).   2. Right ventricular systolic function is normal. The right ventricular  size is normal.   3. The mitral valve is normal in structure. No evidence of mitral  valve  regurgitation. No evidence of mitral stenosis.   4. The aortic valve is normal in structure. Aortic valve regurgitation is  not visualized. No aortic stenosis is present. There is a 29 mm Sapien  prosthetic (TAVR) valve present in the aortic position. Procedure Date:  08/12/2020. Aortic valve mean gradient measures 8.8 mmHg. Aortic valve Vmax measures 2.10 m/s. Aortic valve acceleration time measures 61 msec.   5. The inferior vena cava is normal in size with greater than 50%  respiratory variability, suggesting right  atrial pressure of 3 mmHg.  EKG:  EKG is NOT ordered today.  Recent Labs: 08/08/2020: ALT 24 08/13/2020: BUN 28; Creatinine, Ser 1.52; Hemoglobin 13.2; Magnesium 2.2; Platelets 156; Potassium 4.5; Sodium 138  Recent Lipid Panel    Component Value Date/Time   CHOL 94 06/28/2017 0915   TRIG 112.0 06/28/2017 0915   HDL 27.90 (L) 06/28/2017 0915   CHOLHDL 3 06/28/2017 0915   VLDL 22.4 06/28/2017 0915   LDLCALC 43 06/28/2017 0915   LDLDIRECT 73.8 11/20/2012 0840     Risk Assessment/Calculations:       Physical Exam:    VS:  BP 128/60   Pulse 92   Ht '5\' 7"'$  (1.702 m)   Wt 164 lb 3.2 oz (74.5 kg)   SpO2 97%   BMI 25.72 kg/m     Wt Readings from Last 3 Encounters:  09/11/20 164 lb 3.2 oz (74.5 kg)  08/28/20 169 lb (76.7 kg)  08/13/20 166 lb 12 oz (75.6 kg)     GEN:  Well nourished, well developed in no acute distress HEENT: Normal NECK: No JVD; No carotid bruits LYMPHATICS: No lymphadenopathy CARDIAC: RRR, no murmurs, rubs, gallops RESPIRATORY:  Clear to auscultation without rales, wheezing or rhonchi  ABDOMEN: Soft, non-tender, non-distended MUSCULOSKELETAL:  No edema; No deformity  SKIN: Warm and dry NEUROLOGIC:  Alert and oriented x 3 PSYCHIATRIC:  Normal affect   ASSESSMENT:    1. S/P TAVR (transcatheter aortic valve replacement)   2. Essential hypertension   3. Stage 3b chronic kidney disease ([place])   4. Coronary artery disease involving native heart without angina pectoris, unspecified vessel or lesion type     PLAN:    In order of problems listed above:  Severe AS s/p TAVR: doing okay 1 month out from TAVR. Unfortunately, he missed his echo today, which will be rescheduled. He has NYHA class II symptoms, which I think might related mostly to his insomnia. SBE prophylaxis discussed; he has amoxicillin.  Continue on aspirin and Plavix.  He can discontinue plavix after 6 months (02/2021). I will see him back in 1 year with echo  HTN: Blood pressure well  controlled.  No changes made   CKD stage III: Creatinine has remained stable around 1.5.  CAD: pre TAVR cath showed a widely patent left main, widely patent LAD with ostial stenosis of a small second diagonal branch, otherwise no significant obstruction in the LAD territory. There was a patent left circumflex with mild to moderate nonobstructive plaquing in the mid vessel and patent RCA with mild nonobstructive stenosis, except for a 70% PDA stenosis in a relatively small caliber PDA branch. Medical therapy was recommended.   Insomnia: only sleeping 2 hours on a good night. Has tried OTC meds like melatonin, Unisom and benadryl with no effect. I have encouraged him to talk to his PCP about a prescription sleep aid as I think it would help him feel much better if he got better sleep.  Cardiac Rehabilitation Eligibility Assessment: The patient is ready to start Cardiac Rehab from a cardiac standpoint.    Medication Adjustments/Labs and Tests Ordered: Current medicines are reviewed at length with the patient today.  Concerns regarding medicines are outlined above.  No orders of the defined types were placed in this encounter.   No orders of the defined types were placed in this encounter.   Patient Instructions  Medication Instructions:  Stop Plavix after you finish the next bottle.  (Total of 6 months)  *If you need a refill on your cardiac medications before your next appointment, please call your pharmacy*   Lab Work: none If you have labs (blood work) drawn today and your tests are completely normal, you will receive your results only by: [place] (if you have MyChart) OR A paper copy in the mail If you have any lab test that is abnormal or we need to change your treatment, we will call you to review the results.   Testing/Procedures: none   Follow-Up: At [facility], you and your health needs are our priority.  As part of our continuing mission to provide you  with exceptional heart care, we have created designated Provider Care Teams.  These Care Teams include your primary Cardiologist (physician) and Advanced Practice Providers (APPs -  Physician Assistants and Nurse Practitioners) who all work together to provide you with the care you need, when you need it.  Your next appointment:   4 month(s)  The format for your next appointment:   In Person  Provider:   You may see [name], MD or one of the following Advanced Practice Providers on your designated Care Team:   Almyra Deforest, PA-C Fabian Sharp, [place] or  Roby Lofts, PA-C   Other Instructions     Signed, Angelena Form, Hershal Coria  09/11/2020 7:44 PM    [place]

## 2020-09-11 NOTE — Patient Instructions (Signed)
Medication Instructions:  Stop Plavix after you finish the next bottle.  (Total of 6 months)  *If you need a refill on your cardiac medications before your next appointment, please call your pharmacy*   Lab Work: none If you have labs (blood work) drawn today and your tests are completely normal, you will receive your results only by: [place] (if you have MyChart) OR A paper copy in the mail If you have any lab test that is abnormal or we need to change your treatment, we will call you to review the results.   Testing/Procedures: none   Follow-Up: At [facility], you and your health needs are our priority.  As part of our continuing mission to provide you with exceptional heart care, we have created designated Provider Care Teams.  These Care Teams include your primary Cardiologist (physician) and Advanced Practice Providers (APPs -  Physician Assistants and Nurse Practitioners) who all work together to provide you with the care you need, when you need it.  Your next appointment:   4 month(s)  The format for your next appointment:   In Person  Provider:   You may see [name], MD or one of the following Advanced Practice Providers on your designated Care Team:   Almyra Deforest, PA-C Fabian Sharp, PA-C or  Roby Lofts, [place]   Other Instructions

## 2020-09-18 ENCOUNTER — Ambulatory Visit (HOSPITAL_COMMUNITY): Payer: Medicare Other | Attending: Cardiology

## 2020-09-18 ENCOUNTER — Other Ambulatory Visit (HOSPITAL_COMMUNITY): Payer: Medicare Other

## 2020-09-18 ENCOUNTER — Other Ambulatory Visit: Payer: Self-pay

## 2020-09-18 DIAGNOSIS — Z952 Presence of prosthetic heart valve: Secondary | ICD-10-CM | POA: Diagnosis not present

## 2020-09-18 LAB — ECHOCARDIOGRAM COMPLETE
AR max vel: 1.19 cm2
AV Area VTI: 1.32 cm2
AV Area mean vel: 1.12 cm2
AV Mean grad: 10 mmHg
AV Peak grad: 18.3 mmHg
Ao pk vel: 2.14 m/s
Area-P 1/2: 3.45 cm2
S' Lateral: 3.5 cm

## 2020-09-23 ENCOUNTER — Telehealth (HOSPITAL_COMMUNITY): Payer: Self-pay

## 2020-09-23 NOTE — Telephone Encounter (Signed)
Called patient to see if he was interested in participating in the Cardiac Rehab Program. Patient stated yes. Patient will come in for orientation on 10/28/2020'@9'$ :30am and will attend the 3:00pm exercise class.   Tourist information centre manager.

## 2020-10-02 DIAGNOSIS — N1832 Chronic kidney disease, stage 3b: Secondary | ICD-10-CM | POA: Diagnosis not present

## 2020-10-02 DIAGNOSIS — Z7984 Long term (current) use of oral hypoglycemic drugs: Secondary | ICD-10-CM | POA: Diagnosis not present

## 2020-10-02 DIAGNOSIS — E1122 Type 2 diabetes mellitus with diabetic chronic kidney disease: Secondary | ICD-10-CM | POA: Diagnosis not present

## 2020-10-02 DIAGNOSIS — Z8639 Personal history of other endocrine, nutritional and metabolic disease: Secondary | ICD-10-CM | POA: Diagnosis not present

## 2020-10-15 DIAGNOSIS — E538 Deficiency of other specified B group vitamins: Secondary | ICD-10-CM | POA: Diagnosis not present

## 2020-10-23 ENCOUNTER — Telehealth (HOSPITAL_COMMUNITY): Payer: Self-pay

## 2020-10-27 ENCOUNTER — Telehealth (HOSPITAL_COMMUNITY): Payer: Self-pay

## 2020-10-28 ENCOUNTER — Encounter (HOSPITAL_COMMUNITY): Payer: Self-pay

## 2020-10-28 ENCOUNTER — Other Ambulatory Visit: Payer: Self-pay

## 2020-10-28 ENCOUNTER — Encounter (HOSPITAL_COMMUNITY)
Admission: RE | Admit: 2020-10-28 | Discharge: 2020-10-28 | Disposition: A | Discharge status: Home / Self Care | Payer: Medicare Other | Source: Ambulatory Visit | Attending: Internal Medicine | Admitting: Internal Medicine

## 2020-10-28 VITALS — BP 118/74 | HR 80 | Ht 64.75 in | Wt 166.9 lb

## 2020-10-28 DIAGNOSIS — Z952 Presence of prosthetic heart valve: Secondary | ICD-10-CM | POA: Insufficient documentation

## 2020-10-28 DIAGNOSIS — Z79899 Other long term (current) drug therapy: Secondary | ICD-10-CM | POA: Insufficient documentation

## 2020-10-28 DIAGNOSIS — Z7984 Long term (current) use of oral hypoglycemic drugs: Secondary | ICD-10-CM | POA: Insufficient documentation

## 2020-10-28 DIAGNOSIS — Z7982 Long term (current) use of aspirin: Secondary | ICD-10-CM | POA: Insufficient documentation

## 2020-10-28 DIAGNOSIS — Z7902 Long term (current) use of antithrombotics/antiplatelets: Secondary | ICD-10-CM | POA: Insufficient documentation

## 2020-10-28 NOTE — Progress Notes (Signed)
Cardiac Individual Treatment Plan  Patient Details  Name: Daryl Webb MRN: [id number] Date of Birth: [birth year] Referring Provider:   Flowsheet Row CARDIAC REHAB PHASE II ORIENTATION from 10/28/2020 in [place]  Referring Provider Lyman Bishop, MD       Initial Encounter Date:  [facility] PHASE II ORIENTATION from 10/28/2020 in [facility]  Date 10/28/20       Visit Diagnosis: 08/12/20 S/P TAVR (transcatheter aortic valve replacement)  Patient's Home Medications on Admission:  Current Outpatient Medications:    amLODipine (NORVASC) 5 MG tablet, TAKE 1 TABLET DAILY, Disp: 90 tablet, Rfl: 1   amoxicillin (AMOXIL) 500 MG capsule, Take 2,000 mg by mouth See admin instructions. Take 2000 mg 1 hour prior to dental procedures, Disp: , Rfl: 2   Ascorbic Acid (VITAMIN C) 1000 MG tablet, Take 1,000 mg by mouth 2 (two) times a week., Disp: , Rfl:    aspirin 81 MG tablet, Take 81 mg by mouth daily., Disp: , Rfl:    aspirin-acetaminophen-caffeine (EXCEDRIN MIGRAINE) 250-250-65 MG tablet, Take 2 tablets by mouth every 6 (six) hours as needed for headache., Disp: , Rfl:    atorvastatin (LIPITOR) 40 MG tablet, TAKE 1 TABLET DAILY, Disp: 90 tablet, Rfl: 1   BYDUREON BCISE 2 MG/0.85ML AUIJ, Inject 2 mg into the skin every Thursday., Disp: , Rfl:    calcium carbonate (TUMS - DOSED IN MG ELEMENTAL CALCIUM) 500 MG chewable tablet, Chew 2 tablets by mouth daily as needed for indigestion or heartburn., Disp: , Rfl:    Cholecalciferol 2000 UNITS CAPS, Take 2,000 Units by mouth 2 (two) times a week., Disp: , Rfl:    clopidogrel (PLAVIX) 75 MG tablet, Take 1 tablet (75 mg total) by mouth daily with breakfast., Disp: 90 tablet, Rfl: 1   clotrimazole (LOTRIMIN) 1 % cream, Apply 1 application topically 2 (two) times daily as needed (jock itch)., Disp: , Rfl:    Coenzyme Q10 (COQ10) 100 MG CAPS, Take 100 mg by mouth 2 (two)  times a week., Disp: , Rfl:    [employer] CINNAMON PO, Take 1 capsule by mouth 2 (two) times a week., Disp: , Rfl:    cyanocobalamin (,VITAMIN B-12,) 1000 MCG/ML injection, Inject 1,000 mcg into the muscle every 30 (thirty) days., Disp: , Rfl:    dapagliflozin propanediol (FARXIGA) 5 MG TABS tablet, Take 5 mg by mouth daily., Disp: , Rfl:    diclofenac (VOLTAREN) 75 MG EC tablet, TAKE 1 TABLET DAILY AS     NEEDED, Disp: 90 tablet, Rfl: 2   diphenhydrAMINE HCl, Sleep, (ZZZQUIL) 25 MG CAPS, Take 25 mg by mouth at bedtime., Disp: , Rfl:    losartan-hydrochlorothiazide (HYZAAR) 50-12.5 MG tablet, TAKE 1 TABLET DAILY, Disp: 90 tablet, Rfl: 1   metFORMIN (GLUCOPHAGE) 500 MG tablet, Take 500 mg by mouth 2 (two) times daily., Disp: , Rfl:    metoprolol succinate (TOPROL-XL) 50 MG 24 hr tablet, TAKE 1 TABLET DAILY, Disp: 90 tablet, Rfl: 2   nystatin ointment (MYCOSTATIN), Apply 1 application topically 3 (three) times daily. (Patient taking differently: Apply 1 application topically 3 (three) times daily as needed (jock itch).), Disp: 30 g, Rfl: 0   OLIVE LEAF EXTRACT PO, Take 600 mg by mouth 2 (two) times a week., Disp: , Rfl:    Omega-3 Fatty Acids (FISH OIL) 1000 MG CAPS, Take 1,000 mg by mouth 2 (two) times a week., Disp: , Rfl:    QUERCETIN  PO, Take 500 mg by mouth 2 (two) times a week., Disp: , Rfl:    ACCU-CHEK SMARTVIEW test strip, Use to test blood sugars once daily, Disp: , Rfl:   Past Medical History: Past Medical History:  Diagnosis Date   Anemia    Arthritis    CKD (chronic kidney disease)    Constipation due to pain medication    needs stool softner while on pain medication   Diabetes mellitus    Duodenal ulcer 08/13/2015   2014  - Dr Kaplan/GI   H/O: osteoarthritis    History of kidney stones    History of prostate cancer    History of urethral stricture    Hypercholesterolemia    Hypertension    Prostate cancer ([place])    prostate   S/P TAVR (transcatheter aortic valve replacement)  08/12/2020   s/p TAVR with a 29 mm Edwards Sapien 3 THV via the TF approach by [name] and [name]   Severe aortic stenosis    Spinal stenosis    Ulcer duodenal hemorrhage     Tobacco Use: Social History   Tobacco Use  Smoking Status Former   Packs/day: 1.50   Years: 20.00   Pack years: 30.00   Types: Cigarettes   Quit date: 02/09/1980   Years since quitting: 40.7  Smokeless Tobacco Never    Labs: Recent Review Flowsheet Data     Labs for ITP Cardiac and Pulmonary Rehab Latest Ref Rng & Units 05/08/2020 05/08/2020 08/08/2020 08/12/2020 08/12/2020   Cholestrol 0 - 200 mg/dL - - - - -   LDLCALC 0 - 99 mg/dL - - - - -   LDLDIRECT mg/dL - - - - -   HDL >39.00 mg/dL - - - - -   Trlycerides 0.0 - 149.0 mg/dL - - - - -   Hemoglobin A1c 4.8 - 5.6 % - - 7.0(H) - -   PHART 7.350 - 7.450 7.364 - 7.389 - -   PCO2ART 32.0 - 48.0 mmHg 42.4 - 36.6 - -   HCO3 20.0 - 28.0 mmol/L 24.2 24.5 21.6 - -   TCO2 22 - 32 mmol/L 25 26 - 25 22   ACIDBASEDEF 0.0 - 2.0 mmol/L 1.0 1.0 2.5(H) - -   O2SAT % 99.0 78.0 97.6 - -       Capillary Blood Glucose: Lab Results  Component Value Date   GLUCAP 129 (H) 08/13/2020   GLUCAP 222 (H) 08/12/2020   GLUCAP 180 (H) 08/12/2020   GLUCAP 170 (H) 08/12/2020   GLUCAP 189 (H) 08/08/2020     Exercise Target Goals: Exercise Program Goal: Individual exercise prescription set using results from initial 6 min walk test and THRR while considering  patient's activity barriers and safety.   Exercise Prescription Goal: Starting with aerobic activity 30 plus minutes a day, 3 days per week for initial exercise prescription. Provide home exercise prescription and guidelines that participant acknowledges understanding prior to discharge.  Activity Barriers & Risk Stratification:  Activity Barriers & Cardiac Risk Stratification - 10/28/20 1047       Activity Barriers & Cardiac Risk Stratification   Activity Barriers Right Knee Replacement;Joint  Problems;Deconditioning;Muscular Weakness;Shortness of Breath;Balance Concerns    Cardiac Risk Stratification High             6 Minute Walk:  6 Minute Walk     Row Name 10/28/20 1000         6 Minute Walk   Phase Initial  Distance 948 feet     Walk Time 6 minutes     # of Rest Breaks 0     MPH 1.8     METS 1.87     RPE 13     Perceived Dyspnea  2     VO2 Peak 6.53     Symptoms Yes (comment)     Comments SOB, RPD = 2, left hip/buttock pain 3/10     Resting HR 74 bpm     Resting BP 118/74     Resting Oxygen Saturation  97 %     Exercise Oxygen Saturation  during 6 min walk 96 %     Max Ex. HR 91 bpm     Max Ex. BP 132/70     2 Minute Post BP 114/66              Oxygen Initial Assessment:   Oxygen Re-Evaluation:   Oxygen Discharge (Final Oxygen Re-Evaluation):   Initial Exercise Prescription:  Initial Exercise Prescription - 10/28/20 1000       Date of Initial Exercise RX and Referring Provider   Date 10/28/20    Referring Provider Lyman Bishop, MD    Expected Discharge Date 12/26/20      NuStep   Level 2    SPM 75    Minutes 25    METs 2      Prescription Details   Frequency (times per week) 3    Duration Progress to 30 minutes of continuous aerobic without signs/symptoms of physical distress      Intensity   THRR 40-80% of Max Heartrate 58-116    Ratings of Perceived Exertion 11-13    Perceived Dyspnea 0-4      Progression   Progression Continue progressive overload as per policy without signs/symptoms or physical distress.      Resistance Training   Training Prescription Yes    Weight 75.7 kg    Reps 10-15             Perform Capillary Blood Glucose checks as needed.  Exercise Prescription Changes:   Exercise Comments:   Exercise Goals and Review:   Exercise Goals     Row Name 10/28/20 1047             Exercise Goals   Increase Physical Activity Yes       Intervention Provide advice, education, support  and counseling about physical activity/exercise needs.;Develop an individualized exercise prescription for aerobic and resistive training based on initial evaluation findings, risk stratification, comorbidities and participant's personal goals.       Expected Outcomes Long Term: Exercising regularly at least 3-5 days a week.;Short Term: Attend rehab on a regular basis to increase amount of physical activity.;Long Term: Add in home exercise to make exercise part of routine and to increase amount of physical activity.       Increase Strength and Stamina Yes       Intervention Provide advice, education, support and counseling about physical activity/exercise needs.;Develop an individualized exercise prescription for aerobic and resistive training based on initial evaluation findings, risk stratification, comorbidities and participant's personal goals.       Expected Outcomes Short Term: Increase workloads from initial exercise prescription for resistance, speed, and METs.;Short Term: Perform resistance training exercises routinely during rehab and add in resistance training at home;Long Term: Improve cardiorespiratory fitness, muscular endurance and strength as measured by increased METs and functional capacity (6MWT)       Able to understand  and use rate of perceived exertion (RPE) scale Yes       Intervention Provide education and explanation on how to use RPE scale       Expected Outcomes Short Term: Able to use RPE daily in rehab to express subjective intensity level;Long Term:  Able to use RPE to guide intensity level when exercising independently       Knowledge and understanding of Target Heart Rate Range (THRR) Yes       Intervention Provide education and explanation of THRR including how the numbers were predicted and where they are located for reference       Expected Outcomes Short Term: Able to state/look up THRR;Short Term: Able to use daily as guideline for intensity in rehab;Long Term: Able to  use THRR to govern intensity when exercising independently       Understanding of Exercise Prescription Yes       Intervention Provide education, explanation, and written materials on patient's individual exercise prescription       Expected Outcomes Short Term: Able to explain program exercise prescription;Long Term: Able to explain home exercise prescription to exercise independently                Exercise Goals Re-Evaluation :    Discharge Exercise Prescription (Final Exercise Prescription Changes):   Nutrition:  Target Goals: Understanding of nutrition guidelines, daily intake of sodium 1500mg , cholesterol 200mg , calories 30% from fat and 7% or less from saturated fats, daily to have 5 or more servings of fruits and vegetables.  Biometrics:  Pre Biometrics - 10/28/20 0930       Pre Biometrics   Waist Circumference 39 inches    Hip Circumference 39 inches    Waist to Hip Ratio 1 %    Triceps Skinfold 12 mm    % Body Fat 29.1 %    Grip Strength 22 kg    Flexibility 0 in   Could not reach   Single Leg Stand 14.18 seconds              Nutrition Therapy Plan and Nutrition Goals:   Nutrition Assessments:  MEDIFICTS Score Key: ?70 Need to make dietary changes  40-70 Heart Healthy Diet ? 40 Therapeutic Level Cholesterol Diet   Picture Your Plate Scores: <88 Unhealthy dietary pattern with much room for improvement. 41-50 Dietary pattern unlikely to meet recommendations for good health and room for improvement. 51-60 More healthful dietary pattern, with some room for improvement.  >60 Healthy dietary pattern, although there may be some specific behaviors that could be improved.    Nutrition Goals Re-Evaluation:   Nutrition Goals Discharge (Final Nutrition Goals Re-Evaluation):   Psychosocial: Target Goals: Acknowledge presence or absence of significant depression and/or stress, maximize coping skills, provide positive support system. Participant is able  to verbalize types and ability to use techniques and skills needed for reducing stress and depression.  Initial Review & Psychosocial Screening:  Initial Psych Review & Screening - 10/28/20 0958       Initial Review   Current issues with None Identified      Family Dynamics   Good Support System? Yes   Rush Landmark has his wife and two children who live in the area for support     Barriers   Psychosocial barriers to participate in program There are no identifiable barriers or psychosocial needs.      Screening Interventions   Interventions Encouraged to exercise  Quality of Life Scores:  Quality of Life - 10/28/20 1044       Quality of Life   Select Quality of Life      Quality of Life Scores   Health/Function Pre 19.83 %    Socioeconomic Pre 20.29 %    Psych/Spiritual Pre 22.5 %    Family Pre 21.1 %    GLOBAL Pre 20.66 %            Scores of 19 and below usually indicate a poorer quality of life in these areas.  A difference of  2-3 points is a clinically meaningful difference.  A difference of 2-3 points in the total score of the Quality of Life Index has been associated with significant improvement in overall quality of life, self-image, physical symptoms, and general health in studies assessing change in quality of life.  PHQ-9: Recent Review Flowsheet Data     Depression screen [facility] 2/9 10/28/2020 05/19/2020 03/13/2019 02/20/2018 02/15/2017   Decreased Interest 0 0 0 0 0   Down, Depressed, Hopeless 0 0 0 0 0   PHQ - 2 Score 0 0 0 0 0      Interpretation of Total Score  Total Score Depression Severity:  1-4 = Minimal depression, 5-9 = Mild depression, 10-14 = Moderate depression, 15-19 = Moderately severe depression, 20-27 = Severe depression   Psychosocial Evaluation and Intervention:   Psychosocial Re-Evaluation:   Psychosocial Discharge (Final Psychosocial Re-Evaluation):   Vocational Rehabilitation: Provide vocational rehab assistance to  qualifying candidates.   Vocational Rehab Evaluation & Intervention:  Vocational Rehab - 10/28/20 0959       Initial Vocational Rehab Evaluation & Intervention   Assessment shows need for Vocational Rehabilitation No   Rush Landmark is retired and does not need vocational rehab at this time            Education: Education Goals: Education classes will be provided on a weekly basis, covering required topics. Participant will state understanding/return demonstration of topics presented.  Learning Barriers/Preferences:  Learning Barriers/Preferences - 10/28/20 1038       Learning Barriers/Preferences   Learning Barriers Sight   wears glasses   Learning Preferences Audio;Written Material;Computer/Internet;Group Instruction;Individual Instruction;Pictoral;Skilled Demonstration;Verbal Instruction;Video             Education Topics: Hypertension, Hypertension Reduction -Define heart disease and high blood pressure. Discus how high blood pressure affects the body and ways to reduce high blood pressure.   Exercise and Your Heart -Discuss why it is important to exercise, the FITT principles of exercise, normal and abnormal responses to exercise, and how to exercise safely.   Angina -Discuss definition of angina, causes of angina, treatment of angina, and how to decrease risk of having angina.   Cardiac Medications -Review what the following cardiac medications are used for, how they affect the body, and side effects that may occur when taking the medications.  Medications include Aspirin, Beta blockers, calcium channel blockers, ACE Inhibitors, angiotensin receptor blockers, diuretics, digoxin, and antihyperlipidemics.   Congestive Heart Failure -Discuss the definition of CHF, how to live with CHF, the signs and symptoms of CHF, and how keep track of weight and sodium intake.   Heart Disease and Intimacy -Discus the effect sexual activity has on the heart, how changes occur during  intimacy as we age, and safety during sexual activity.   Smoking Cessation / COPD -Discuss different methods to quit smoking, the health benefits of quitting smoking, and the definition of COPD.   Nutrition  I: Fats -Discuss the types of cholesterol, what cholesterol does to the heart, and how cholesterol levels can be controlled.   Nutrition II: Labels -Discuss the different components of food labels and how to read food label   Heart Parts/Heart Disease and PAD -Discuss the anatomy of the heart, the pathway of blood circulation through the heart, and these are affected by heart disease.   Stress I: Signs and Symptoms -Discuss the causes of stress, how stress may lead to anxiety and depression, and ways to limit stress.   Stress II: Relaxation -Discuss different types of relaxation techniques to limit stress.   Warning Signs of Stroke / TIA -Discuss definition of a stroke, what the signs and symptoms are of a stroke, and how to identify when someone is having stroke.   Knowledge Questionnaire Score:  Knowledge Questionnaire Score - 10/28/20 1116       Knowledge Questionnaire Score   Pre Score 21/24             Core Components/Risk Factors/Patient Goals at Admission:  Personal Goals and Risk Factors at Admission - 10/28/20 1037       Core Components/Risk Factors/Patient Goals on Admission    Weight Management Yes;Obesity;Weight Loss    Intervention Weight Management: Develop a combined nutrition and exercise program designed to reach desired caloric intake, while maintaining appropriate intake of nutrient and fiber, sodium and fats, and appropriate energy expenditure required for the weight goal.;Weight Management: Provide education and appropriate resources to help participant work on and attain dietary goals.;Weight Management/Obesity: Establish reasonable short term and long term weight goals.;Obesity: Provide education and appropriate resources to help participant  work on and attain dietary goals.    Admit Weight 166 lb 14.2 oz (75.7 kg)    Expected Outcomes Short Term: Continue to assess and modify interventions until short term weight is achieved;Long Term: Adherence to nutrition and physical activity/exercise program aimed toward attainment of established weight goal;Weight Maintenance: Understanding of the daily nutrition guidelines, which includes 25-35% calories from fat, 7% or less cal from saturated fats, less than 200mg  cholesterol, less than 1.5gm of sodium, & 5 or more servings of fruits and vegetables daily;Weight Loss: Understanding of general recommendations for a balanced deficit meal plan, which promotes 1-2 lb weight loss per week and includes a negative energy balance of [phone] kcal/d;Understanding recommendations for meals to include 15-35% energy as protein, 25-35% energy from fat, 35-60% energy from carbohydrates, less than 200mg  of dietary cholesterol, 20-35 gm of total fiber daily;Understanding of distribution of calorie intake throughout the day with the consumption of 4-5 meals/snacks    Diabetes Yes    Intervention Provide education about signs/symptoms and action to take for hypo/hyperglycemia.;Provide education about proper nutrition, including hydration, and aerobic/resistive exercise prescription along with prescribed medications to achieve blood glucose in normal ranges: Fasting glucose 65-99 mg/dL    Expected Outcomes Short Term: Participant verbalizes understanding of the signs/symptoms and immediate care of hyper/hypoglycemia, proper foot care and importance of medication, aerobic/resistive exercise and nutrition plan for blood glucose control.;Long Term: Attainment of HbA1C < 7%.    Hypertension Yes    Intervention Provide education on lifestyle modifcations including regular physical activity/exercise, weight management, moderate sodium restriction and increased consumption of fresh fruit, vegetables, and low fat dairy, alcohol  moderation, and smoking cessation.;Monitor prescription use compliance.    Expected Outcomes Short Term: Continued assessment and intervention until BP is < 140/39mm HG in hypertensive participants. < 130/86mm HG in hypertensive participants with diabetes, heart  failure or chronic kidney disease.;Long Term: Maintenance of blood pressure at goal levels.    Lipids Yes    Intervention Provide education and support for participant on nutrition & aerobic/resistive exercise along with prescribed medications to achieve LDL 70mg , HDL >40mg .    Expected Outcomes Short Term: Participant states understanding of desired cholesterol values and is compliant with medications prescribed. Participant is following exercise prescription and nutrition guidelines.;Long Term: Cholesterol controlled with medications as prescribed, with individualized exercise RX and with personalized nutrition plan. Value goals: LDL < 70mg , HDL > 40 mg.             Core Components/Risk Factors/Patient Goals Review:    Core Components/Risk Factors/Patient Goals at Discharge (Final Review):    ITP Comments:  ITP Comments     Row Name 10/28/20 0957           ITP Comments Dr Fransico Him MD, Medical Director                Comments: Patient attended orientation on 10/28/2020 to review rules and guidelines for program.  Completed 6 minute walk test, Intitial ITP, and exercise prescription.  VSS. Telemetry-Sinus rhythm T wave inversion occasional PAC, PVC this has been previously documented. Bill reported having level 2 dyspnea and 3/10 left hip pain this resolved with rest. Safety measures and social distancing in place per CDC guidelines. Barnet Pall, RN,BSN 10/28/2020 4:06 PM

## 2020-10-28 NOTE — Progress Notes (Signed)
Cardiac Rehab Medication Review by a Nurse  Does the patient  feel that his/her medications are working for him/her?  yes  Has the patient been experiencing any side effects to the medications prescribed?  no  Does the patient measure his/her own blood pressure or blood glucose at home?  yes   Does the patient have any problems obtaining medications due to transportation or finances?   no  Understanding of regimen: good Understanding of indications: good Potential of compliance: good    Nurse comments: Daryl Webb is taking his medications as prescribed and has a good understanding of what his medications are for. Daryl Webb has a CBG meter and check's his blood pressures at home.    Christa See [facility] RN 10/28/2020 11:36 AM

## 2020-11-03 ENCOUNTER — Encounter (HOSPITAL_COMMUNITY)
Admission: RE | Admit: 2020-11-03 | Discharge: 2020-11-03 | Disposition: A | Discharge status: Home / Self Care | Payer: Medicare Other | Source: Ambulatory Visit | Attending: Internal Medicine | Admitting: Internal Medicine

## 2020-11-03 ENCOUNTER — Other Ambulatory Visit: Payer: Self-pay

## 2020-11-03 DIAGNOSIS — Z952 Presence of prosthetic heart valve: Secondary | ICD-10-CM | POA: Diagnosis not present

## 2020-11-03 DIAGNOSIS — Z79899 Other long term (current) drug therapy: Secondary | ICD-10-CM | POA: Diagnosis not present

## 2020-11-03 DIAGNOSIS — Z7902 Long term (current) use of antithrombotics/antiplatelets: Secondary | ICD-10-CM | POA: Diagnosis not present

## 2020-11-03 DIAGNOSIS — Z7984 Long term (current) use of oral hypoglycemic drugs: Secondary | ICD-10-CM | POA: Diagnosis not present

## 2020-11-03 DIAGNOSIS — Z7982 Long term (current) use of aspirin: Secondary | ICD-10-CM | POA: Diagnosis not present

## 2020-11-03 LAB — GLUCOSE, CAPILLARY
Glucose-Capillary: 238 mg/dL — ABNORMAL HIGH (ref 70–99)
Glucose-Capillary: 163 mg/dL — ABNORMAL HIGH (ref 70–99)

## 2020-11-03 NOTE — Progress Notes (Signed)
Daily Session Note  Patient Details  Name: Daryl Webb MRN: [id number] Date of Birth: Nov 02, 1945 Referring Provider:   Flowsheet Row CARDIAC REHAB PHASE II ORIENTATION from 10/28/2020 in [place]  Referring Provider Lyman Bishop, MD       Encounter Date: 11/03/2020  Check In:  Session Check In - 11/03/20 1527       Check-In   Supervising physician immediately available to respond to emergencies [facility] immediately available    Physician(s) Dr. Florene Glen    Location [facility]    Staff Present Barnet Pall, RN, Milus Glazier, MS, ACSM-CEP, CCRP, Exercise Physiologist;Carlette Wilber Oliphant, RN, Deland Pretty, MS, ACSM CEP, Exercise Physiologist;Annedrea Rosezella [place], RN, [place]    Virtual Visit No    Medication changes reported     No    Fall or balance concerns reported    No    Tobacco Cessation No Change    Warm-up and Cool-down Performed on first and last piece of equipment    Resistance Training Performed Yes    VAD Patient? No    PAD/SET Patient? No      Pain Assessment   Currently in Pain? No/denies    Pain Score 0-No pain    Multiple Pain Sites No             Capillary Blood Glucose: Results for orders placed or performed during the hospital encounter of 11/03/20 (from the past 24 hour(s))  Glucose, capillary     Status: Abnormal   Collection Time: 11/03/20  2:51 PM  Result Value Ref Range   Glucose-Capillary 238 (H) 70 - 99 mg/dL  Glucose, capillary     Status: Abnormal   Collection Time: 11/03/20  3:54 PM  Result Value Ref Range   Glucose-Capillary 163 (H) 70 - 99 mg/dL     Exercise Prescription Changes - 11/03/20 1507       Response to Exercise   Blood Pressure (Admit) 140/70    Blood Pressure (Exercise) 144/78    Blood Pressure (Exit) 122/70    Heart Rate (Admit) 87 bpm    Heart Rate (Exercise) 100 bpm    Heart Rate (Exit) 69 bpm    Rating of Perceived Exertion (Exercise) 10     Perceived Dyspnea (Exercise) 0    Symptoms None    Comments Off to a good start with exercise.    Duration Progress to 30 minutes of  aerobic without signs/symptoms of physical distress    Intensity THRR unchanged      Progression   Progression Continue to progress workloads to maintain intensity without signs/symptoms of physical distress.    Average METs 1.8      Resistance Training   Training Prescription Yes    Weight 3 lbs    Reps 10-15    Time 10 Minutes      Interval Training   Interval Training No      NuStep   Level 2    SPM 75    Minutes 25    METs 1.8             Social History   Tobacco Use  Smoking Status Former   Packs/day: 1.50   Years: 20.00   Pack years: 30.00   Types: Cigarettes   Quit date: 02/09/1980   Years since quitting: 40.7  Smokeless Tobacco Never    Goals Met:  Exercise tolerated well No report of concerns or symptoms today Strength training completed today  Goals Unmet:  Not Applicable  Comments: Rush Landmark  started cardiac rehab today.  Pt tolerated light exercise without difficulty. VSS, telemetry-Sinus Rhythm T wave inversion, asymptomatic.  Medication list reconciled. Pt denies barriers to medicaiton compliance.  PSYCHOSOCIAL ASSESSMENT:  PHQ-0. Pt exhibits positive coping skills, hopeful outlook with supportive family. No psychosocial needs identified at this time, no psychosocial interventions necessary.    Pt enjoys radio controlled airplanes, boats and reading.   Pt oriented to exercise equipment and routine.    Understanding verbalized. QUALITY OF LIFE SCORE REVIEW  Pt completed Quality of Life survey as a participant in Cardiac Rehab.  Scores 21.0 or below are considered low.  Pt score very low in several areas Overall 20.66, Health and Function 19.83, socioeconomic 20.29, physiological and spiritual 22.00, family 21.10. Patient quality of life slightly altered by physical constraints which limits ability to perform as prior to  recent cardiac illness. Rush Landmark denies being depressed and hopes that cardiac rehab will increase his endurance.Liana Gerold emotional support and reassurance.  Will continue to monitor and intervene as necessary.Barnet Pall, RN,BSN 11/04/2020 2:33 PM    Dr. Fransico Him is Medical Director for Cardiac Rehab at [facility].

## 2020-11-05 ENCOUNTER — Encounter (HOSPITAL_COMMUNITY)
Admission: RE | Admit: 2020-11-05 | Discharge: 2020-11-05 | Disposition: A | Discharge status: Home / Self Care | Payer: Medicare Other | Source: Ambulatory Visit | Attending: Internal Medicine | Admitting: Internal Medicine

## 2020-11-05 ENCOUNTER — Other Ambulatory Visit: Payer: Self-pay

## 2020-11-05 DIAGNOSIS — Z7984 Long term (current) use of oral hypoglycemic drugs: Secondary | ICD-10-CM | POA: Diagnosis not present

## 2020-11-05 DIAGNOSIS — Z7982 Long term (current) use of aspirin: Secondary | ICD-10-CM | POA: Diagnosis not present

## 2020-11-05 DIAGNOSIS — Z952 Presence of prosthetic heart valve: Secondary | ICD-10-CM | POA: Diagnosis not present

## 2020-11-05 DIAGNOSIS — Z79899 Other long term (current) drug therapy: Secondary | ICD-10-CM | POA: Diagnosis not present

## 2020-11-05 DIAGNOSIS — Z7902 Long term (current) use of antithrombotics/antiplatelets: Secondary | ICD-10-CM | POA: Diagnosis not present

## 2020-11-05 LAB — GLUCOSE, CAPILLARY: Glucose-Capillary: 153 mg/dL — ABNORMAL HIGH (ref 70–99)

## 2020-11-06 DIAGNOSIS — Z23 Encounter for immunization: Secondary | ICD-10-CM | POA: Diagnosis not present

## 2020-11-07 ENCOUNTER — Other Ambulatory Visit: Payer: Self-pay

## 2020-11-07 ENCOUNTER — Encounter (HOSPITAL_COMMUNITY)
Admission: RE | Admit: 2020-11-07 | Discharge: 2020-11-07 | Disposition: A | Discharge status: Home / Self Care | Payer: Medicare Other | Source: Ambulatory Visit | Attending: Internal Medicine | Admitting: Internal Medicine

## 2020-11-07 DIAGNOSIS — Z952 Presence of prosthetic heart valve: Secondary | ICD-10-CM

## 2020-11-07 DIAGNOSIS — Z7902 Long term (current) use of antithrombotics/antiplatelets: Secondary | ICD-10-CM | POA: Diagnosis not present

## 2020-11-07 DIAGNOSIS — Z7982 Long term (current) use of aspirin: Secondary | ICD-10-CM | POA: Diagnosis not present

## 2020-11-07 DIAGNOSIS — Z79899 Other long term (current) drug therapy: Secondary | ICD-10-CM | POA: Diagnosis not present

## 2020-11-07 DIAGNOSIS — Z7984 Long term (current) use of oral hypoglycemic drugs: Secondary | ICD-10-CM | POA: Diagnosis not present

## 2020-11-10 ENCOUNTER — Other Ambulatory Visit: Payer: Self-pay

## 2020-11-10 ENCOUNTER — Encounter (HOSPITAL_COMMUNITY)
Admission: RE | Admit: 2020-11-10 | Discharge: 2020-11-10 | Disposition: A | Discharge status: Home / Self Care | Payer: Medicare Other | Source: Ambulatory Visit | Attending: Internal Medicine | Admitting: Internal Medicine

## 2020-11-10 DIAGNOSIS — Z7902 Long term (current) use of antithrombotics/antiplatelets: Secondary | ICD-10-CM | POA: Diagnosis not present

## 2020-11-10 DIAGNOSIS — Z952 Presence of prosthetic heart valve: Secondary | ICD-10-CM | POA: Insufficient documentation

## 2020-11-10 DIAGNOSIS — E119 Type 2 diabetes mellitus without complications: Secondary | ICD-10-CM | POA: Diagnosis not present

## 2020-11-10 DIAGNOSIS — Z7982 Long term (current) use of aspirin: Secondary | ICD-10-CM | POA: Insufficient documentation

## 2020-11-10 DIAGNOSIS — Z79899 Other long term (current) drug therapy: Secondary | ICD-10-CM | POA: Insufficient documentation

## 2020-11-10 DIAGNOSIS — Z7984 Long term (current) use of oral hypoglycemic drugs: Secondary | ICD-10-CM | POA: Diagnosis not present

## 2020-11-10 DIAGNOSIS — Z48812 Encounter for surgical aftercare following surgery on the circulatory system: Secondary | ICD-10-CM | POA: Insufficient documentation

## 2020-11-12 ENCOUNTER — Other Ambulatory Visit: Payer: Self-pay

## 2020-11-12 ENCOUNTER — Encounter (HOSPITAL_COMMUNITY)
Admission: RE | Admit: 2020-11-12 | Discharge: 2020-11-12 | Disposition: A | Discharge status: Home / Self Care | Payer: Medicare Other | Source: Ambulatory Visit | Attending: Internal Medicine | Admitting: Internal Medicine

## 2020-11-12 DIAGNOSIS — Z952 Presence of prosthetic heart valve: Secondary | ICD-10-CM

## 2020-11-12 DIAGNOSIS — Z7902 Long term (current) use of antithrombotics/antiplatelets: Secondary | ICD-10-CM | POA: Diagnosis not present

## 2020-11-12 DIAGNOSIS — Z48812 Encounter for surgical aftercare following surgery on the circulatory system: Secondary | ICD-10-CM | POA: Diagnosis not present

## 2020-11-12 DIAGNOSIS — Z7984 Long term (current) use of oral hypoglycemic drugs: Secondary | ICD-10-CM | POA: Diagnosis not present

## 2020-11-12 DIAGNOSIS — Z7982 Long term (current) use of aspirin: Secondary | ICD-10-CM | POA: Diagnosis not present

## 2020-11-12 DIAGNOSIS — E119 Type 2 diabetes mellitus without complications: Secondary | ICD-10-CM | POA: Diagnosis not present

## 2020-11-14 ENCOUNTER — Other Ambulatory Visit: Payer: Self-pay

## 2020-11-14 ENCOUNTER — Encounter (HOSPITAL_COMMUNITY)
Admission: RE | Admit: 2020-11-14 | Discharge: 2020-11-14 | Disposition: A | Discharge status: Home / Self Care | Payer: Medicare Other | Source: Ambulatory Visit | Attending: Internal Medicine | Admitting: Internal Medicine

## 2020-11-14 DIAGNOSIS — E119 Type 2 diabetes mellitus without complications: Secondary | ICD-10-CM | POA: Diagnosis not present

## 2020-11-14 DIAGNOSIS — Z7982 Long term (current) use of aspirin: Secondary | ICD-10-CM | POA: Diagnosis not present

## 2020-11-14 DIAGNOSIS — Z952 Presence of prosthetic heart valve: Secondary | ICD-10-CM | POA: Diagnosis not present

## 2020-11-14 DIAGNOSIS — Z7984 Long term (current) use of oral hypoglycemic drugs: Secondary | ICD-10-CM | POA: Diagnosis not present

## 2020-11-14 DIAGNOSIS — Z7902 Long term (current) use of antithrombotics/antiplatelets: Secondary | ICD-10-CM | POA: Diagnosis not present

## 2020-11-14 DIAGNOSIS — Z48812 Encounter for surgical aftercare following surgery on the circulatory system: Secondary | ICD-10-CM | POA: Diagnosis not present

## 2020-11-14 LAB — GLUCOSE, CAPILLARY: Glucose-Capillary: 224 mg/dL — ABNORMAL HIGH (ref 70–99)

## 2020-11-14 NOTE — Progress Notes (Signed)
CARDIAC REHAB PHASE I   Reviewed home exercise with pt today. Pt will be walking 1-2 days at home for 30 minutes per session. Reminded pt to use his RPE scale, [facility] and to keep his phone on him while walking. Reinforced S/S to stop exercise and when to call MD vs 911. Advised pt of temperature precautions and to add in warm up, cool down and stretches into his exercise. Pt verbalized understanding, all questions were answered and he was given a copy to take home.  Kirby Funk ACSM-EP 11/14/2020 4:28 PM

## 2020-11-17 ENCOUNTER — Encounter (HOSPITAL_COMMUNITY)
Admission: RE | Admit: 2020-11-17 | Discharge: 2020-11-17 | Disposition: A | Discharge status: Home / Self Care | Payer: Medicare Other | Source: Ambulatory Visit | Attending: Internal Medicine | Admitting: Internal Medicine

## 2020-11-17 ENCOUNTER — Other Ambulatory Visit: Payer: Self-pay

## 2020-11-17 DIAGNOSIS — Z952 Presence of prosthetic heart valve: Secondary | ICD-10-CM

## 2020-11-17 DIAGNOSIS — Z7902 Long term (current) use of antithrombotics/antiplatelets: Secondary | ICD-10-CM | POA: Diagnosis not present

## 2020-11-17 DIAGNOSIS — Z7984 Long term (current) use of oral hypoglycemic drugs: Secondary | ICD-10-CM | POA: Diagnosis not present

## 2020-11-17 DIAGNOSIS — Z48812 Encounter for surgical aftercare following surgery on the circulatory system: Secondary | ICD-10-CM | POA: Diagnosis not present

## 2020-11-17 DIAGNOSIS — Z7982 Long term (current) use of aspirin: Secondary | ICD-10-CM | POA: Diagnosis not present

## 2020-11-17 DIAGNOSIS — E119 Type 2 diabetes mellitus without complications: Secondary | ICD-10-CM | POA: Diagnosis not present

## 2020-11-17 NOTE — Progress Notes (Signed)
Cardiac Individual Treatment Plan  Patient Details  Name: Daryl Webb MRN: [id number] Date of Birth: 12/27/1945 Referring Provider:   Flowsheet Row CARDIAC REHAB PHASE II ORIENTATION from 10/28/2020 in [facility]  Referring Provider Kenneth Hilty, MD       Initial Encounter Date:  Flowsheet Row CARDIAC REHAB PHASE II ORIENTATION from 10/28/2020 in [facility]  Date 10/28/20       Visit Diagnosis: 08/12/20 S/P TAVR (transcatheter aortic valve replacement)  Patient's Home Medications on Admission:  Current Outpatient Medications:    ACCU-CHEK SMARTVIEW test strip, Use to test blood sugars once daily, Disp: , Rfl:    amLODipine (NORVASC) 5 MG tablet, TAKE 1 TABLET DAILY, Disp: 90 tablet, Rfl: 1   amoxicillin (AMOXIL) 500 MG capsule, Take 2,000 mg by mouth See admin instructions. Take 2000 mg 1 hour prior to dental procedures, Disp: , Rfl: 2   Ascorbic Acid (VITAMIN C) 1000 MG tablet, Take 1,000 mg by mouth 2 (two) times a week., Disp: , Rfl:    aspirin 81 MG tablet, Take 81 mg by mouth daily., Disp: , Rfl:    aspirin-acetaminophen-caffeine (EXCEDRIN MIGRAINE) 250-250-65 MG tablet, Take 2 tablets by mouth every 6 (six) hours as needed for headache., Disp: , Rfl:    atorvastatin (LIPITOR) 40 MG tablet, TAKE 1 TABLET DAILY, Disp: 90 tablet, Rfl: 1   BYDUREON BCISE 2 MG/0.85ML AUIJ, Inject 2 mg into the skin every Thursday., Disp: , Rfl:    calcium carbonate (TUMS - DOSED IN MG ELEMENTAL CALCIUM) 500 MG chewable tablet, Chew 2 tablets by mouth daily as needed for indigestion or heartburn., Disp: , Rfl:    Cholecalciferol 2000 UNITS CAPS, Take 2,000 Units by mouth 2 (two) times a week., Disp: , Rfl:    clopidogrel (PLAVIX) 75 MG tablet, Take 1 tablet (75 mg total) by mouth daily with breakfast., Disp: 90 tablet, Rfl: 1   clotrimazole (LOTRIMIN) 1 % cream, Apply 1 application topically 2 (two) times daily as needed (jock  itch)., Disp: , Rfl:    Coenzyme Q10 (COQ10) 100 MG CAPS, Take 100 mg by mouth 2 (two) times a week., Disp: , Rfl:    [employer] CINNAMON PO, Take 1 capsule by mouth 2 (two) times a week., Disp: , Rfl:    cyanocobalamin (,VITAMIN B-12,) 1000 MCG/ML injection, Inject 1,000 mcg into the muscle every 30 (thirty) days., Disp: , Rfl:    dapagliflozin propanediol (FARXIGA) 5 MG TABS tablet, Take 5 mg by mouth daily., Disp: , Rfl:    diclofenac (VOLTAREN) 75 MG EC tablet, TAKE 1 TABLET DAILY AS     NEEDED, Disp: 90 tablet, Rfl: 2   diphenhydrAMINE HCl, Sleep, (ZZZQUIL) 25 MG CAPS, Take 25 mg by mouth at bedtime., Disp: , Rfl:    losartan-hydrochlorothiazide (HYZAAR) 50-12.5 MG tablet, TAKE 1 TABLET DAILY, Disp: 90 tablet, Rfl: 1   metFORMIN (GLUCOPHAGE) 500 MG tablet, Take 500 mg by mouth 2 (two) times daily., Disp: , Rfl:    metoprolol succinate (TOPROL-XL) 50 MG 24 hr tablet, TAKE 1 TABLET DAILY, Disp: 90 tablet, Rfl: 2   nystatin ointment (MYCOSTATIN), Apply 1 application topically 3 (three) times daily. (Patient taking differently: Apply 1 application topically 3 (three) times daily as needed (jock itch).), Disp: 30 g, Rfl: 0   OLIVE LEAF EXTRACT PO, Take 600 mg by mouth 2 (two) times a week., Disp: , Rfl:    Omega-3 Fatty Acids (FISH OIL) 1000 MG CAPS,   Take 1,000 mg by mouth 2 (two) times a week., Disp: , Rfl:    QUERCETIN PO, Take 500 mg by mouth 2 (two) times a week., Disp: , Rfl:   Past Medical History: Past Medical History:  Diagnosis Date   Anemia    Arthritis    CKD (chronic kidney disease)    Constipation due to pain medication    needs stool softner while on pain medication   Diabetes mellitus    Duodenal ulcer 08/13/2015   2014  - Dr Kaplan/GI   H/O: osteoarthritis    History of kidney stones    History of prostate cancer    History of urethral stricture    Hypercholesterolemia    Hypertension    Prostate cancer (HCC)    prostate   S/P TAVR (transcatheter aortic valve replacement)  08/12/2020   s/p TAVR with a 29 mm Edwards Sapien 3 THV via the TF approach by Dr. Cooper and Dr. Bartle   Severe aortic stenosis    Spinal stenosis    Ulcer duodenal hemorrhage     Tobacco Use: Social History   Tobacco Use  Smoking Status Former   Packs/day: 1.50   Years: 20.00   Pack years: 30.00   Types: Cigarettes   Quit date: 02/09/1980   Years since quitting: 40.8  Smokeless Tobacco Never    Labs: Recent Review Flowsheet Data     Labs for ITP Cardiac and Pulmonary Rehab Latest Ref Rng & Units 05/08/2020 05/08/2020 08/08/2020 08/12/2020 08/12/2020   Cholestrol 0 - 200 mg/dL - - - - -   LDLCALC 0 - 99 mg/dL - - - - -   LDLDIRECT mg/dL - - - - -   HDL >39.00 mg/dL - - - - -   Trlycerides 0.0 - 149.0 mg/dL - - - - -   Hemoglobin A1c 4.8 - 5.6 % - - 7.0(H) - -   PHART 7.350 - 7.450 7.364 - 7.389 - -   PCO2ART 32.0 - 48.0 mmHg 42.4 - 36.6 - -   HCO3 20.0 - 28.0 mmol/L 24.2 24.5 21.6 - -   TCO2 22 - 32 mmol/L 25 26 - 25 22   ACIDBASEDEF 0.0 - 2.0 mmol/L 1.0 1.0 2.5(H) - -   O2SAT % 99.0 78.0 97.6 - -       Capillary Blood Glucose: Lab Results  Component Value Date   GLUCAP 224 (H) 11/14/2020   GLUCAP 153 (H) 11/05/2020   GLUCAP 163 (H) 11/03/2020   GLUCAP 238 (H) 11/03/2020   GLUCAP 129 (H) 08/13/2020     Exercise Target Goals: Exercise Program Goal: Individual exercise prescription set using results from initial 6 min walk test and THRR while considering  patient's activity barriers and safety.   Exercise Prescription Goal: Initial exercise prescription builds to 30-45 minutes a day of aerobic activity, 2-3 days per week.  Home exercise guidelines will be given to patient during program as part of exercise prescription that the participant will acknowledge.  Activity Barriers & Risk Stratification:  Activity Barriers & Cardiac Risk Stratification - 10/28/20 1047       Activity Barriers & Cardiac Risk Stratification   Activity Barriers Right Knee Replacement;Joint  Problems;Deconditioning;Muscular Weakness;Shortness of Breath;Balance Concerns    Cardiac Risk Stratification High             6 Minute Walk:  6 Minute Walk     Row Name 10/28/20 1000         6 Minute Walk     Phase Initial     Distance 948 feet     Walk Time 6 minutes     # of Rest Breaks 0     MPH 1.8     METS 1.87     RPE 13     Perceived Dyspnea  2     VO2 Peak 6.53     Symptoms Yes (comment)     Comments SOB, RPD = 2, left hip/buttock pain 3/10     Resting HR 74 bpm     Resting BP 118/74     Resting Oxygen Saturation  97 %     Exercise Oxygen Saturation  during 6 min walk 96 %     Max Ex. HR 91 bpm     Max Ex. BP 132/70     2 Minute Post BP 114/66              Oxygen Initial Assessment:   Oxygen Re-Evaluation:   Oxygen Discharge (Final Oxygen Re-Evaluation):   Initial Exercise Prescription:  Initial Exercise Prescription - 10/28/20 1000       Date of Initial Exercise RX and Referring Provider   Date 10/28/20    Referring Provider Lyman Bishop, MD    Expected Discharge Date 12/26/20      NuStep   Level 2    SPM 75    Minutes 25    METs 2      Prescription Details   Frequency (times per week) 3    Duration Progress to 30 minutes of continuous aerobic without signs/symptoms of physical distress      Intensity   THRR 40-80% of Max Heartrate 58-116    Ratings of Perceived Exertion 11-13    Perceived Dyspnea 0-4      Progression   Progression Continue progressive overload as per policy without signs/symptoms or physical distress.      Resistance Training   Training Prescription Yes    Weight 75.7 kg    Reps 10-15             Perform Capillary Blood Glucose checks as needed.  Exercise Prescription Changes:   Exercise Prescription Changes     Row Name 11/03/20 1507 11/14/20 1620           Response to Exercise   Blood Pressure (Admit) 140/70 132/70      Blood Pressure (Exercise) 144/78 132/62      Blood Pressure (Exit)  122/70 118/62      Heart Rate (Admit) 87 bpm 93 bpm      Heart Rate (Exercise) 100 bpm 92 bpm      Heart Rate (Exit) 69 bpm 81 bpm      Rating of Perceived Exertion (Exercise) 10 9      Perceived Dyspnea (Exercise) 0 0      Symptoms None None      Comments Off to a good start with exercise. Reviewed MET's goals and home Ex Rx      Duration Progress to 30 minutes of  aerobic without signs/symptoms of physical distress Progress to 30 minutes of  aerobic without signs/symptoms of physical distress      Intensity THRR unchanged THRR unchanged             Progression   Progression Continue to progress workloads to maintain intensity without signs/symptoms of physical distress. Continue to progress workloads to maintain intensity without signs/symptoms of physical distress.      Average METs 1.8 2.1  Resistance Training   Training Prescription Yes Yes      Weight 3 lbs 3 lbs      Reps 10-15 10-15      Time 10 Minutes 10 Minutes             Interval Training   Interval Training No No             NuStep   Level 2 2      SPM 75 85      Minutes 25 25      METs 1.8 2.1             Home Exercise Plan   Plans to continue exercise at -- Home (comment)      Frequency -- Add 2 additional days to program exercise sessions.      Initial Home Exercises Provided -- 11/14/20               Exercise Comments:   Exercise Comments     Row Name 11/03/20 1357 11/14/20 1627         Exercise Comments Patient tolerated low intensity exercise well without symptoms. Reviewed MET's, goals and home Ex Rx with pt today. Pt is tolerating exercise well with an average MET level of 2.1. Pt feels like he is off to a good start with his goals of increasing strength and stamina. Pt will add in 1-2 days of walking for 30 minutes a session at home to help him reach his goals. Will continue to monitor pt and progress workloads as tolerated without sign or symptom               Exercise  Goals and Review:   Exercise Goals     Row Name 10/28/20 1047             Exercise Goals   Increase Physical Activity Yes       Intervention Provide advice, education, support and counseling about physical activity/exercise needs.;Develop an individualized exercise prescription for aerobic and resistive training based on initial evaluation findings, risk stratification, comorbidities and participant's personal goals.       Expected Outcomes Long Term: Exercising regularly at least 3-5 days a week.;Short Term: Attend rehab on a regular basis to increase amount of physical activity.;Long Term: Add in home exercise to make exercise part of routine and to increase amount of physical activity.       Increase Strength and Stamina Yes       Intervention Provide advice, education, support and counseling about physical activity/exercise needs.;Develop an individualized exercise prescription for aerobic and resistive training based on initial evaluation findings, risk stratification, comorbidities and participant's personal goals.       Expected Outcomes Short Term: Increase workloads from initial exercise prescription for resistance, speed, and METs.;Short Term: Perform resistance training exercises routinely during rehab and add in resistance training at home;Long Term: Improve cardiorespiratory fitness, muscular endurance and strength as measured by increased METs and functional capacity ( )       Able to understand and use rate of perceived exertion (RPE) scale Yes       Intervention Provide education and explanation on how to use RPE scale       Expected Outcomes Short Term: Able to use RPE daily in rehab to express subjective intensity level;Long Term:  Able to use RPE to guide intensity level when exercising independently       Knowledge and understanding of Target Heart Rate Range (THRR) Yes  Intervention Provide education and explanation of THRR including how the numbers were predicted  and where they are located for reference       Expected Outcomes Short Term: Able to state/look up THRR;Short Term: Able to use daily as guideline for intensity in rehab;Long Term: Able to use THRR to govern intensity when exercising independently       Understanding of Exercise Prescription Yes       Intervention Provide education, explanation, and written materials on patient's individual exercise prescription       Expected Outcomes Short Term: Able to explain program exercise prescription;Long Term: Able to explain home exercise prescription to exercise independently                Exercise Goals Re-Evaluation :  Exercise Goals Re-Evaluation     [place] Name 11/03/20 1357 11/14/20 1622           Exercise Goal Re-Evaluation   Exercise Goals Review Increase Physical Activity;Able to understand and use rate of perceived exertion (RPE) scale Increase Physical Activity;Increase Strength and Stamina;Able to understand and use rate of perceived exertion (RPE) scale;Knowledge and understanding of Target Heart Rate Range (THRR);Understanding of Exercise Prescription      Comments Patient able to understand and use RPE scale appropriately. Reviewed MET's, goals and home Ex Rx with pt today. Pt is tolerating exercise well with an average MET level of 2.1. Pt feels like he is off to a good start with his goals of increasing strength and stamina. Pt will add in 1-2 days of walking for 30 minutes a session at home to help him reach his goals.      Expected Outcomes Progress workloads as tolerated to help increase cardiorespiratory fitness. Will continue to monitor pt and progress workloads as tolerated without sign or symptom.               Discharge Exercise Prescription (Final Exercise Prescription Changes):  Exercise Prescription Changes - 11/14/20 1620       Response to Exercise   Blood Pressure (Admit) 132/70    Blood Pressure (Exercise) 132/62    Blood Pressure (Exit) 118/62    Heart  Rate (Admit) 93 bpm    Heart Rate (Exercise) 92 bpm    Heart Rate (Exit) 81 bpm    Rating of Perceived Exertion (Exercise) 9    Perceived Dyspnea (Exercise) 0    Symptoms None    Comments Reviewed MET's goals and home Ex Rx    Duration Progress to 30 minutes of  aerobic without signs/symptoms of physical distress    Intensity THRR unchanged      Progression   Progression Continue to progress workloads to maintain intensity without signs/symptoms of physical distress.    Average METs 2.1      Resistance Training   Training Prescription Yes    Weight 3 lbs    Reps 10-15    Time 10 Minutes      Interval Training   Interval Training No      NuStep   Level 2    SPM 85    Minutes 25    METs 2.1      Home Exercise Plan   Plans to continue exercise at Home (comment)    Frequency Add 2 additional days to program exercise sessions.    Initial Home Exercises Provided 11/14/20             Nutrition:  Target Goals: Understanding of nutrition guidelines, daily intake of sodium <  $'1500mg'z$ , cholesterol '200mg'$ , calories 30% from fat and 7% or less from saturated fats, daily to have 5 or more servings of fruits and vegetables.  Biometrics:  Pre Biometrics - 10/28/20 0930       Pre Biometrics   Waist Circumference 39 inches    Hip Circumference 39 inches    Waist to Hip Ratio 1 %    Triceps Skinfold 12 mm    % Body Fat 29.1 %    Grip Strength 22 kg    Flexibility 0 in   Could not reach   Single Leg Stand 14.18 seconds              Nutrition Therapy Plan and Nutrition Goals:   Nutrition Assessments:  MEDIFICTS Score Key: ?70 Need to make dietary changes  40-70 Heart Healthy Diet ? 40 Therapeutic Level Cholesterol Diet    Picture Your Plate Scores: <19 Unhealthy dietary pattern with much room for improvement. 41-50 Dietary pattern unlikely to meet recommendations for good health and room for improvement. 51-60 More healthful dietary pattern, with some room for  improvement.  >60 Healthy dietary pattern, although there may be some specific behaviors that could be improved.    Nutrition Goals Re-Evaluation:   Nutrition Goals Re-Evaluation:   Nutrition Goals Discharge (Final Nutrition Goals Re-Evaluation):   Psychosocial: Target Goals: Acknowledge presence or absence of significant depression and/or stress, maximize coping skills, provide positive support system. Participant is able to verbalize types and ability to use techniques and skills needed for reducing stress and depression.  Initial Review & Psychosocial Screening:  Initial Psych Review & Screening - 10/28/20 0958       Initial Review   Current issues with None Identified      Family Dynamics   Good Support System? Yes   Rush Landmark has his wife and two children who live in the area for support     Barriers   Psychosocial barriers to participate in program There are no identifiable barriers or psychosocial needs.      Screening Interventions   Interventions Encouraged to exercise             Quality of Life Scores:  Quality of Life - 10/28/20 1044       Quality of Life   Select Quality of Life      Quality of Life Scores   Health/Function Pre 19.83 %    Socioeconomic Pre 20.29 %    Psych/Spiritual Pre 22.5 %    Family Pre 21.1 %    GLOBAL Pre 20.66 %            Scores of 19 and below usually indicate a poorer quality of life in these areas.  A difference of  2-3 points is a clinically meaningful difference.  A difference of 2-3 points in the total score of the Quality of Life Index has been associated with significant improvement in overall quality of life, self-image, physical symptoms, and general health in studies assessing change in quality of life.  PHQ-9: Recent Review Flowsheet Data     Depression screen [facility] 2/9 10/28/2020 05/19/2020 03/13/2019 02/20/2018 02/15/2017   Decreased Interest 0 0 0 0 0   Down, Depressed, Hopeless 0 0 0 0 0   PHQ - 2 Score 0 0 0 0 0       Interpretation of Total Score  Total Score Depression Severity:  1-4 = Minimal depression, 5-9 = Mild depression, 10-14 = Moderate depression, 15-19 = Moderately severe depression, 20-27 =  Severe depression   Psychosocial Evaluation and Intervention:   Psychosocial Re-Evaluation:  Psychosocial Re-Evaluation     [place] Name 11/04/20 1436 11/14/20 1807           Psychosocial Re-Evaluation   Current issues with None Identified None Identified      Comments Reviewed QOL. --      Expected Outcomes Will continue to monitor and offer support as needed Will continue to monitor and offer support as needed      Interventions Encouraged to attend Cardiac Rehabilitation for the exercise Encouraged to attend Cardiac Rehabilitation for the exercise      Continue Psychosocial Services  No Follow up required No Follow up required               Psychosocial Discharge (Final Psychosocial Re-Evaluation):  Psychosocial Re-Evaluation - 11/14/20 1807       Psychosocial Re-Evaluation   Current issues with None Identified    Expected Outcomes Will continue to monitor and offer support as needed    Interventions Encouraged to attend Cardiac Rehabilitation for the exercise    Continue Psychosocial Services  No Follow up required             Vocational Rehabilitation: Provide vocational rehab assistance to qualifying candidates.   Vocational Rehab Evaluation & Intervention:  Vocational Rehab - 10/28/20 0959       Initial Vocational Rehab Evaluation & Intervention   Assessment shows need for Vocational Rehabilitation No   Rush Landmark is retired and does not need vocational rehab at this time            Education: Education Goals: Education classes will be provided on a weekly basis, covering required topics. Participant will state understanding/return demonstration of topics presented.  Learning Barriers/Preferences:  Learning Barriers/Preferences - 10/28/20 1038       Learning  Barriers/Preferences   Learning Barriers Sight   wears glasses   Learning Preferences Audio;Written Material;Computer/Internet;Group Instruction;Individual Instruction;Pictoral;Skilled Demonstration;Verbal Instruction;Video             Education Topics: Count Your Pulse:  -Group instruction provided by verbal instruction, demonstration, patient participation and written materials to support subject.  Instructors address importance of being able to find your pulse and how to count your pulse when at home without a heart monitor.  Patients get hands on experience counting their pulse with staff help and individually.   Heart Attack, Angina, and Risk Factor Modification:  -Group instruction provided by verbal instruction, video, and written materials to support subject.  Instructors address signs and symptoms of angina and heart attacks.    Also discuss risk factors for heart disease and how to make changes to improve heart health risk factors.   Functional Fitness:  -Group instruction provided by verbal instruction, demonstration, patient participation, and written materials to support subject.  Instructors address safety measures for doing things around the house.  Discuss how to get up and down off the floor, how to pick things up properly, how to safely get out of a chair without assistance, and balance training.   Meditation and Mindfulness:  -Group instruction provided by verbal instruction, patient participation, and written materials to support subject.  Instructor addresses importance of mindfulness and meditation practice to help reduce stress and improve awareness.  Instructor also leads participants through a meditation exercise.    Stretching for Flexibility and Mobility:  -Group instruction provided by verbal instruction, patient participation, and written materials to support subject.  Instructors lead participants through series of stretches that  are designed to increase  flexibility thus improving mobility.  These stretches are additional exercise for major muscle groups that are typically performed during regular warm up and cool down.   Hands Only CPR:  -Group verbal, video, and participation provides a basic overview of AHA guidelines for community CPR. Role-play of emergencies allow participants the opportunity to practice calling for help and chest compression technique with discussion of AED use.   Hypertension: -Group verbal and written instruction that provides a basic overview of hypertension including the most recent diagnostic guidelines, risk factor reduction with self-care instructions and medication management.    Nutrition I class: Heart Healthy Eating:  -Group instruction provided by PowerPoint slides, verbal discussion, and written materials to support subject matter. The instructor gives an explanation and review of the Therapeutic Lifestyle Changes diet recommendations, which includes a discussion on lipid goals, dietary fat, sodium, fiber, plant stanol/sterol esters, sugar, and the components of a well-balanced, healthy diet.   Nutrition II class: Lifestyle Skills:  -Group instruction provided by PowerPoint slides, verbal discussion, and written materials to support subject matter. The instructor gives an explanation and review of label reading, grocery shopping for heart health, heart healthy recipe modifications, and ways to make healthier choices when eating out.   Diabetes Question & Answer:  -Group instruction provided by PowerPoint slides, verbal discussion, and written materials to support subject matter. The instructor gives an explanation and review of diabetes co-morbidities, pre- and post-prandial blood glucose goals, pre-exercise blood glucose goals, signs, symptoms, and treatment of hypoglycemia and hyperglycemia, and foot care basics.   Diabetes Blitz:  -Group instruction provided by PowerPoint slides, verbal discussion, and  written materials to support subject matter. The instructor gives an explanation and review of the physiology behind type 1 and type 2 diabetes, diabetes medications and rational behind using different medications, pre- and post-prandial blood glucose recommendations and Hemoglobin A1c goals, diabetes diet, and exercise including blood glucose guidelines for exercising safely.    Portion Distortion:  -Group instruction provided by PowerPoint slides, verbal discussion, written materials, and food models to support subject matter. The instructor gives an explanation of serving size versus portion size, changes in portions sizes over the last 20 years, and what consists of a serving from each food group.   Stress Management:  -Group instruction provided by verbal instruction, video, and written materials to support subject matter.  Instructors review role of stress in heart disease and how to cope with stress positively.     Exercising on Your Own:  -Group instruction provided by verbal instruction, power point, and written materials to support subject.  Instructors discuss benefits of exercise, components of exercise, frequency and intensity of exercise, and end points for exercise.  Also discuss use of nitroglycerin and activating EMS.  Review options of places to exercise outside of rehab.  Review guidelines for sex with heart disease.   Cardiac Drugs I:  -Group instruction provided by verbal instruction and written materials to support subject.  Instructor reviews cardiac drug classes: antiplatelets, anticoagulants, beta blockers, and statins.  Instructor discusses reasons, side effects, and lifestyle considerations for each drug class.   Cardiac Drugs II:  -Group instruction provided by verbal instruction and written materials to support subject.  Instructor reviews cardiac drug classes: angiotensin converting enzyme inhibitors (ACE-I), angiotensin II receptor blockers (ARBs), nitrates, and  calcium channel blockers.  Instructor discusses reasons, side effects, and lifestyle considerations for each drug class.   Anatomy and Physiology of the Circulatory System:  Group  verbal and written instruction and models provide basic cardiac anatomy and physiology, with the coronary electrical and arterial systems. Review of: AMI, Angina, Valve disease, Heart Failure, Peripheral Artery Disease, Cardiac Arrhythmia, Pacemakers, and the ICD.   Other Education:  -Group or individual verbal, written, or video instructions that support the educational goals of the cardiac rehab program.   Holiday Eating Survival Tips:  -Group instruction provided by PowerPoint slides, verbal discussion, and written materials to support subject matter. The instructor gives patients tips, tricks, and techniques to help them not only survive but enjoy the holidays despite the onslaught of food that accompanies the holidays.   Knowledge Questionnaire Score:  Knowledge Questionnaire Score - 10/28/20 1116       Knowledge Questionnaire Score   Pre Score 21/24             Core Components/Risk Factors/Patient Goals at Admission:  Personal Goals and Risk Factors at Admission - 10/28/20 1037       Core Components/Risk Factors/Patient Goals on Admission    Weight Management Yes;Obesity;Weight Loss    Intervention Weight Management: Develop a combined nutrition and exercise program designed to reach desired caloric intake, while maintaining appropriate intake of nutrient and fiber, sodium and fats, and appropriate energy expenditure required for the weight goal.;Weight Management: Provide education and appropriate resources to help participant work on and attain dietary goals.;Weight Management/Obesity: Establish reasonable short term and long term weight goals.;Obesity: Provide education and appropriate resources to help participant work on and attain dietary goals.    Admit Weight 166 lb 14.2 oz (75.7 kg)     Expected Outcomes Short Term: Continue to assess and modify interventions until short term weight is achieved;Long Term: Adherence to nutrition and physical activity/exercise program aimed toward attainment of established weight goal;Weight Maintenance: Understanding of the daily nutrition guidelines, which includes 25-35% calories from fat, 7% or less cal from saturated fats, less than $RemoveB'200mg'ZNXRdkkH$  cholesterol, less than 1.5gm of sodium, & 5 or more servings of fruits and vegetables daily;Weight Loss: Understanding of general recommendations for a balanced deficit meal plan, which promotes 1-2 lb weight loss per week and includes a negative energy balance of [phone] kcal/d;Understanding recommendations for meals to include 15-35% energy as protein, 25-35% energy from fat, 35-60% energy from carbohydrates, less than $RemoveB'200mg'xbtVZYez$  of dietary cholesterol, 20-35 gm of total fiber daily;Understanding of distribution of calorie intake throughout the day with the consumption of 4-5 meals/snacks    Diabetes Yes    Intervention Provide education about signs/symptoms and action to take for hypo/hyperglycemia.;Provide education about proper nutrition, including hydration, and aerobic/resistive exercise prescription along with prescribed medications to achieve blood glucose in normal ranges: Fasting glucose 65-99 mg/dL    Expected Outcomes Short Term: Participant verbalizes understanding of the signs/symptoms and immediate care of hyper/hypoglycemia, proper foot care and importance of medication, aerobic/resistive exercise and nutrition plan for blood glucose control.;Long Term: Attainment of HbA1C < 7%.    Hypertension Yes    Intervention Provide education on lifestyle modifcations including regular physical activity/exercise, weight management, moderate sodium restriction and increased consumption of fresh fruit, vegetables, and low fat dairy, alcohol moderation, and smoking cessation.;Monitor prescription use compliance.     Expected Outcomes Short Term: Continued assessment and intervention until BP is < 140/73mm HG in hypertensive participants. < 130/4mm HG in hypertensive participants with diabetes, heart failure or chronic kidney disease.;Long Term: Maintenance of blood pressure at goal levels.    Lipids Yes    Intervention Provide education and support for participant  on nutrition & aerobic/resistive exercise along with prescribed medications to achieve LDL '70mg'$ , HDL >$Remo'40mg'jDmxe$ .    Expected Outcomes Short Term: Participant states understanding of desired cholesterol values and is compliant with medications prescribed. Participant is following exercise prescription and nutrition guidelines.;Long Term: Cholesterol controlled with medications as prescribed, with individualized exercise RX and with personalized nutrition plan. Value goals: LDL < $Rem'70mg'obHv$ , HDL > 40 mg.             Core Components/Risk Factors/Patient Goals Review:   Goals and Risk Factor Review     Row Name 11/04/20 1437 11/14/20 1807           Core Components/Risk Factors/Patient Goals Review   Personal Goals Review Weight Management/Obesity;Hypertension;Diabetes;Lipids Weight Management/Obesity;Hypertension;Diabetes;Lipids      Review Bill started exercise at cardiac rehab on 11/03/20 and did well with exercise. Vital signs and CBG's were stable. [facility] has been doing well with exercise. Vital sings have been stable. CBG's have been variable. Will continue to monitor.      Expected Outcomes Bill will continue to partcipate in phase 2 cardiac rehab for exercise, nutrition and life syle modificaytions. [facility] will continue to partcipate in phase 2 cardiac rehab for exercise, nutrition and life syle modificaytions.               Core Components/Risk Factors/Patient Goals at Discharge (Final Review):   Goals and Risk Factor Review - 11/14/20 1807       Core Components/Risk Factors/Patient Goals Review   Personal Goals Review Weight  Management/Obesity;Hypertension;Diabetes;Lipids    Review [facility] has been doing well with exercise. Vital sings have been stable. CBG's have been variable. Will continue to monitor.    Expected Outcomes Bill will continue to partcipate in phase 2 cardiac rehab for exercise, nutrition and life syle modificaytions.             ITP Comments:  ITP Comments     Row Name 10/28/20 0957 11/04/20 1434 11/14/20 1805       ITP Comments Dr Fransico Him MD, Medical Director 30 Day ITP Review. Bill started exercise at cardiac rehab on 11/03/20 and did well with exercise. 30 Day ITP Review. [facility] has good attendance and participation in cardiac rehab and is off to a good start to exercise.              Comments: See ITP comments.

## 2020-11-18 DIAGNOSIS — E538 Deficiency of other specified B group vitamins: Secondary | ICD-10-CM | POA: Diagnosis not present

## 2020-11-18 DIAGNOSIS — Z23 Encounter for immunization: Secondary | ICD-10-CM | POA: Diagnosis not present

## 2020-11-19 ENCOUNTER — Encounter (HOSPITAL_COMMUNITY)
Admission: RE | Admit: 2020-11-19 | Discharge: 2020-11-19 | Disposition: A | Discharge status: Home / Self Care | Payer: Medicare Other | Source: Ambulatory Visit | Attending: Internal Medicine | Admitting: Internal Medicine

## 2020-11-19 ENCOUNTER — Other Ambulatory Visit: Payer: Self-pay

## 2020-11-19 DIAGNOSIS — E119 Type 2 diabetes mellitus without complications: Secondary | ICD-10-CM | POA: Diagnosis not present

## 2020-11-19 DIAGNOSIS — Z48812 Encounter for surgical aftercare following surgery on the circulatory system: Secondary | ICD-10-CM | POA: Diagnosis not present

## 2020-11-19 DIAGNOSIS — Z7902 Long term (current) use of antithrombotics/antiplatelets: Secondary | ICD-10-CM | POA: Diagnosis not present

## 2020-11-19 DIAGNOSIS — Z7982 Long term (current) use of aspirin: Secondary | ICD-10-CM | POA: Diagnosis not present

## 2020-11-19 DIAGNOSIS — Z952 Presence of prosthetic heart valve: Secondary | ICD-10-CM

## 2020-11-19 DIAGNOSIS — Z7984 Long term (current) use of oral hypoglycemic drugs: Secondary | ICD-10-CM | POA: Diagnosis not present

## 2020-11-21 ENCOUNTER — Encounter (HOSPITAL_COMMUNITY)
Admission: RE | Admit: 2020-11-21 | Discharge: 2020-11-21 | Disposition: A | Discharge status: Home / Self Care | Payer: Medicare Other | Source: Ambulatory Visit | Attending: Internal Medicine | Admitting: Internal Medicine

## 2020-11-21 ENCOUNTER — Other Ambulatory Visit: Payer: Self-pay

## 2020-11-21 DIAGNOSIS — Z952 Presence of prosthetic heart valve: Secondary | ICD-10-CM | POA: Diagnosis not present

## 2020-11-21 DIAGNOSIS — Z7902 Long term (current) use of antithrombotics/antiplatelets: Secondary | ICD-10-CM | POA: Diagnosis not present

## 2020-11-21 DIAGNOSIS — Z48812 Encounter for surgical aftercare following surgery on the circulatory system: Secondary | ICD-10-CM | POA: Diagnosis not present

## 2020-11-21 DIAGNOSIS — E119 Type 2 diabetes mellitus without complications: Secondary | ICD-10-CM | POA: Diagnosis not present

## 2020-11-21 DIAGNOSIS — Z7984 Long term (current) use of oral hypoglycemic drugs: Secondary | ICD-10-CM | POA: Diagnosis not present

## 2020-11-21 DIAGNOSIS — Z7982 Long term (current) use of aspirin: Secondary | ICD-10-CM | POA: Diagnosis not present

## 2020-11-24 ENCOUNTER — Encounter (HOSPITAL_COMMUNITY)
Admission: RE | Admit: 2020-11-24 | Discharge: 2020-11-24 | Disposition: A | Discharge status: Home / Self Care | Payer: Medicare Other | Source: Ambulatory Visit | Attending: Internal Medicine | Admitting: Internal Medicine

## 2020-11-24 ENCOUNTER — Other Ambulatory Visit: Payer: Self-pay

## 2020-11-24 DIAGNOSIS — Z7982 Long term (current) use of aspirin: Secondary | ICD-10-CM | POA: Diagnosis not present

## 2020-11-24 DIAGNOSIS — E119 Type 2 diabetes mellitus without complications: Secondary | ICD-10-CM | POA: Diagnosis not present

## 2020-11-24 DIAGNOSIS — Z7902 Long term (current) use of antithrombotics/antiplatelets: Secondary | ICD-10-CM | POA: Diagnosis not present

## 2020-11-24 DIAGNOSIS — Z952 Presence of prosthetic heart valve: Secondary | ICD-10-CM

## 2020-11-24 DIAGNOSIS — Z7984 Long term (current) use of oral hypoglycemic drugs: Secondary | ICD-10-CM | POA: Diagnosis not present

## 2020-11-24 DIAGNOSIS — Z48812 Encounter for surgical aftercare following surgery on the circulatory system: Secondary | ICD-10-CM | POA: Diagnosis not present

## 2020-11-26 ENCOUNTER — Encounter (HOSPITAL_COMMUNITY)
Admission: RE | Admit: 2020-11-26 | Discharge: 2020-11-26 | Disposition: A | Discharge status: Home / Self Care | Payer: Medicare Other | Source: Ambulatory Visit | Attending: Internal Medicine | Admitting: Internal Medicine

## 2020-11-26 ENCOUNTER — Other Ambulatory Visit: Payer: Self-pay

## 2020-11-26 DIAGNOSIS — Z7982 Long term (current) use of aspirin: Secondary | ICD-10-CM | POA: Diagnosis not present

## 2020-11-26 DIAGNOSIS — Z7902 Long term (current) use of antithrombotics/antiplatelets: Secondary | ICD-10-CM | POA: Diagnosis not present

## 2020-11-26 DIAGNOSIS — Z7984 Long term (current) use of oral hypoglycemic drugs: Secondary | ICD-10-CM | POA: Diagnosis not present

## 2020-11-26 DIAGNOSIS — Z952 Presence of prosthetic heart valve: Secondary | ICD-10-CM

## 2020-11-26 DIAGNOSIS — Z48812 Encounter for surgical aftercare following surgery on the circulatory system: Secondary | ICD-10-CM | POA: Diagnosis not present

## 2020-11-26 DIAGNOSIS — E119 Type 2 diabetes mellitus without complications: Secondary | ICD-10-CM | POA: Diagnosis not present

## 2020-11-28 ENCOUNTER — Other Ambulatory Visit: Payer: Self-pay

## 2020-11-28 ENCOUNTER — Encounter (HOSPITAL_COMMUNITY)
Admission: RE | Admit: 2020-11-28 | Discharge: 2020-11-28 | Disposition: A | Discharge status: Home / Self Care | Payer: Medicare Other | Source: Ambulatory Visit | Attending: Internal Medicine | Admitting: Internal Medicine

## 2020-11-28 DIAGNOSIS — Z7902 Long term (current) use of antithrombotics/antiplatelets: Secondary | ICD-10-CM | POA: Diagnosis not present

## 2020-11-28 DIAGNOSIS — Z952 Presence of prosthetic heart valve: Secondary | ICD-10-CM

## 2020-11-28 DIAGNOSIS — E119 Type 2 diabetes mellitus without complications: Secondary | ICD-10-CM | POA: Diagnosis not present

## 2020-11-28 DIAGNOSIS — Z7982 Long term (current) use of aspirin: Secondary | ICD-10-CM | POA: Diagnosis not present

## 2020-11-28 DIAGNOSIS — Z7984 Long term (current) use of oral hypoglycemic drugs: Secondary | ICD-10-CM | POA: Diagnosis not present

## 2020-11-28 DIAGNOSIS — Z48812 Encounter for surgical aftercare following surgery on the circulatory system: Secondary | ICD-10-CM | POA: Diagnosis not present

## 2020-12-01 ENCOUNTER — Other Ambulatory Visit: Payer: Self-pay

## 2020-12-01 ENCOUNTER — Encounter (HOSPITAL_COMMUNITY)
Admission: RE | Admit: 2020-12-01 | Discharge: 2020-12-01 | Disposition: A | Discharge status: Home / Self Care | Payer: Medicare Other | Source: Ambulatory Visit | Attending: Internal Medicine | Admitting: Internal Medicine

## 2020-12-01 DIAGNOSIS — Z7984 Long term (current) use of oral hypoglycemic drugs: Secondary | ICD-10-CM | POA: Diagnosis not present

## 2020-12-01 DIAGNOSIS — E119 Type 2 diabetes mellitus without complications: Secondary | ICD-10-CM | POA: Diagnosis not present

## 2020-12-01 DIAGNOSIS — Z7902 Long term (current) use of antithrombotics/antiplatelets: Secondary | ICD-10-CM | POA: Diagnosis not present

## 2020-12-01 DIAGNOSIS — Z7982 Long term (current) use of aspirin: Secondary | ICD-10-CM | POA: Diagnosis not present

## 2020-12-01 DIAGNOSIS — Z952 Presence of prosthetic heart valve: Secondary | ICD-10-CM | POA: Diagnosis not present

## 2020-12-01 DIAGNOSIS — Z48812 Encounter for surgical aftercare following surgery on the circulatory system: Secondary | ICD-10-CM | POA: Diagnosis not present

## 2020-12-03 ENCOUNTER — Encounter (HOSPITAL_COMMUNITY)
Admission: RE | Admit: 2020-12-03 | Discharge: 2020-12-03 | Disposition: A | Discharge status: Home / Self Care | Payer: Medicare Other | Source: Ambulatory Visit | Attending: Internal Medicine | Admitting: Internal Medicine

## 2020-12-03 ENCOUNTER — Other Ambulatory Visit: Payer: Self-pay

## 2020-12-03 DIAGNOSIS — Z7982 Long term (current) use of aspirin: Secondary | ICD-10-CM | POA: Diagnosis not present

## 2020-12-03 DIAGNOSIS — Z48812 Encounter for surgical aftercare following surgery on the circulatory system: Secondary | ICD-10-CM | POA: Diagnosis not present

## 2020-12-03 DIAGNOSIS — E119 Type 2 diabetes mellitus without complications: Secondary | ICD-10-CM | POA: Diagnosis not present

## 2020-12-03 DIAGNOSIS — Z952 Presence of prosthetic heart valve: Secondary | ICD-10-CM | POA: Diagnosis not present

## 2020-12-03 DIAGNOSIS — Z7902 Long term (current) use of antithrombotics/antiplatelets: Secondary | ICD-10-CM | POA: Diagnosis not present

## 2020-12-03 DIAGNOSIS — Z7984 Long term (current) use of oral hypoglycemic drugs: Secondary | ICD-10-CM | POA: Diagnosis not present

## 2020-12-05 ENCOUNTER — Encounter (HOSPITAL_COMMUNITY)
Admission: RE | Admit: 2020-12-05 | Discharge: 2020-12-05 | Disposition: A | Discharge status: Home / Self Care | Payer: Medicare Other | Source: Ambulatory Visit | Attending: Internal Medicine | Admitting: Internal Medicine

## 2020-12-05 ENCOUNTER — Other Ambulatory Visit: Payer: Self-pay

## 2020-12-05 DIAGNOSIS — E119 Type 2 diabetes mellitus without complications: Secondary | ICD-10-CM | POA: Diagnosis not present

## 2020-12-05 DIAGNOSIS — Z952 Presence of prosthetic heart valve: Secondary | ICD-10-CM

## 2020-12-05 DIAGNOSIS — Z7984 Long term (current) use of oral hypoglycemic drugs: Secondary | ICD-10-CM | POA: Diagnosis not present

## 2020-12-05 DIAGNOSIS — Z48812 Encounter for surgical aftercare following surgery on the circulatory system: Secondary | ICD-10-CM | POA: Diagnosis not present

## 2020-12-05 DIAGNOSIS — Z7982 Long term (current) use of aspirin: Secondary | ICD-10-CM | POA: Diagnosis not present

## 2020-12-05 DIAGNOSIS — Z7902 Long term (current) use of antithrombotics/antiplatelets: Secondary | ICD-10-CM | POA: Diagnosis not present

## 2020-12-08 ENCOUNTER — Other Ambulatory Visit: Payer: Self-pay

## 2020-12-08 ENCOUNTER — Encounter (HOSPITAL_COMMUNITY)
Admission: RE | Admit: 2020-12-08 | Discharge: 2020-12-08 | Disposition: A | Discharge status: Home / Self Care | Payer: Medicare Other | Source: Ambulatory Visit | Attending: Internal Medicine | Admitting: Internal Medicine

## 2020-12-08 DIAGNOSIS — Z7902 Long term (current) use of antithrombotics/antiplatelets: Secondary | ICD-10-CM | POA: Diagnosis not present

## 2020-12-08 DIAGNOSIS — Z952 Presence of prosthetic heart valve: Secondary | ICD-10-CM

## 2020-12-08 DIAGNOSIS — Z48812 Encounter for surgical aftercare following surgery on the circulatory system: Secondary | ICD-10-CM | POA: Diagnosis not present

## 2020-12-08 DIAGNOSIS — E119 Type 2 diabetes mellitus without complications: Secondary | ICD-10-CM | POA: Diagnosis not present

## 2020-12-08 DIAGNOSIS — Z7982 Long term (current) use of aspirin: Secondary | ICD-10-CM | POA: Diagnosis not present

## 2020-12-08 DIAGNOSIS — Z7984 Long term (current) use of oral hypoglycemic drugs: Secondary | ICD-10-CM | POA: Diagnosis not present

## 2020-12-10 ENCOUNTER — Other Ambulatory Visit: Payer: Self-pay

## 2020-12-10 ENCOUNTER — Encounter (HOSPITAL_COMMUNITY)
Admission: RE | Admit: 2020-12-10 | Discharge: 2020-12-10 | Disposition: A | Discharge status: Home / Self Care | Payer: Medicare Other | Source: Ambulatory Visit | Attending: Internal Medicine | Admitting: Internal Medicine

## 2020-12-10 DIAGNOSIS — Z952 Presence of prosthetic heart valve: Secondary | ICD-10-CM | POA: Insufficient documentation

## 2020-12-10 NOTE — Progress Notes (Signed)
Cardiac Individual Treatment Plan  Patient Details  Name: Daryl Webb MRN: [id number] Date of Birth: 04/27/1945 Referring Provider:   Flowsheet Row CARDIAC REHAB PHASE II ORIENTATION from 10/28/2020 in [facility]  Referring Provider Kenneth Hilty, MD       Initial Encounter Date:  Flowsheet Row CARDIAC REHAB PHASE II ORIENTATION from 10/28/2020 in [facility]  Date 10/28/20       Visit Diagnosis: 08/12/20 S/P TAVR (transcatheter aortic valve replacement)  Patient's Home Medications on Admission:  Current Outpatient Medications:    ACCU-CHEK SMARTVIEW test strip, Use to test blood sugars once daily, Disp: , Rfl:    amLODipine (NORVASC) 5 MG tablet, TAKE 1 TABLET DAILY, Disp: 90 tablet, Rfl: 1   amoxicillin (AMOXIL) 500 MG capsule, Take 2,000 mg by mouth See admin instructions. Take 2000 mg 1 hour prior to dental procedures, Disp: , Rfl: 2   Ascorbic Acid (VITAMIN C) 1000 MG tablet, Take 1,000 mg by mouth 2 (two) times a week., Disp: , Rfl:    aspirin 81 MG tablet, Take 81 mg by mouth daily., Disp: , Rfl:    aspirin-acetaminophen-caffeine (EXCEDRIN MIGRAINE) 250-250-65 MG tablet, Take 2 tablets by mouth every 6 (six) hours as needed for headache., Disp: , Rfl:    atorvastatin (LIPITOR) 40 MG tablet, TAKE 1 TABLET DAILY, Disp: 90 tablet, Rfl: 1   BYDUREON BCISE 2 MG/0.85ML AUIJ, Inject 2 mg into the skin every Thursday., Disp: , Rfl:    calcium carbonate (TUMS - DOSED IN MG ELEMENTAL CALCIUM) 500 MG chewable tablet, Chew 2 tablets by mouth daily as needed for indigestion or heartburn., Disp: , Rfl:    Cholecalciferol 2000 UNITS CAPS, Take 2,000 Units by mouth 2 (two) times a week., Disp: , Rfl:    clopidogrel (PLAVIX) 75 MG tablet, Take 1 tablet (75 mg total) by mouth daily with breakfast., Disp: 90 tablet, Rfl: 1   clotrimazole (LOTRIMIN) 1 % cream, Apply 1 application topically 2 (two) times daily as needed (jock  itch)., Disp: , Rfl:    Coenzyme Q10 (COQ10) 100 MG CAPS, Take 100 mg by mouth 2 (two) times a week., Disp: , Rfl:    [employer] CINNAMON PO, Take 1 capsule by mouth 2 (two) times a week., Disp: , Rfl:    cyanocobalamin (,VITAMIN B-12,) 1000 MCG/ML injection, Inject 1,000 mcg into the muscle every 30 (thirty) days., Disp: , Rfl:    dapagliflozin propanediol (FARXIGA) 5 MG TABS tablet, Take 5 mg by mouth daily., Disp: , Rfl:    diclofenac (VOLTAREN) 75 MG EC tablet, TAKE 1 TABLET DAILY AS     NEEDED, Disp: 90 tablet, Rfl: 2   diphenhydrAMINE HCl, Sleep, (ZZZQUIL) 25 MG CAPS, Take 25 mg by mouth at bedtime., Disp: , Rfl:    losartan-hydrochlorothiazide (HYZAAR) 50-12.5 MG tablet, TAKE 1 TABLET DAILY, Disp: 90 tablet, Rfl: 1   metFORMIN (GLUCOPHAGE) 500 MG tablet, Take 500 mg by mouth 2 (two) times daily., Disp: , Rfl:    metoprolol succinate (TOPROL-XL) 50 MG 24 hr tablet, TAKE 1 TABLET DAILY, Disp: 90 tablet, Rfl: 2   nystatin ointment (MYCOSTATIN), Apply 1 application topically 3 (three) times daily. (Patient taking differently: Apply 1 application topically 3 (three) times daily as needed (jock itch).), Disp: 30 g, Rfl: 0   OLIVE LEAF EXTRACT PO, Take 600 mg by mouth 2 (two) times a week., Disp: , Rfl:    Omega-3 Fatty Acids (FISH OIL) 1000 MG CAPS,   Take 1,000 mg by mouth 2 (two) times a week., Disp: , Rfl:    QUERCETIN PO, Take 500 mg by mouth 2 (two) times a week., Disp: , Rfl:   Past Medical History: Past Medical History:  Diagnosis Date   Anemia    Arthritis    CKD (chronic kidney disease)    Constipation due to pain medication    needs stool softner while on pain medication   Diabetes mellitus    Duodenal ulcer 08/13/2015   2014  - Dr Kaplan/GI   H/O: osteoarthritis    History of kidney stones    History of prostate cancer    History of urethral stricture    Hypercholesterolemia    Hypertension    Prostate cancer (HCC)    prostate   S/P TAVR (transcatheter aortic valve replacement)  08/12/2020   s/p TAVR with a 29 mm Edwards Sapien 3 THV via the TF approach by Dr. Cooper and Dr. Bartle   Severe aortic stenosis    Spinal stenosis    Ulcer duodenal hemorrhage     Tobacco Use: Social History   Tobacco Use  Smoking Status Former   Packs/day: 1.50   Years: 20.00   Pack years: 30.00   Types: Cigarettes   Quit date: 02/09/1980   Years since quitting: 40.8  Smokeless Tobacco Never    Labs: Recent Review Flowsheet Data     Labs for ITP Cardiac and Pulmonary Rehab Latest Ref Rng & Units 05/08/2020 05/08/2020 08/08/2020 08/12/2020 08/12/2020   Cholestrol 0 - 200 mg/dL - - - - -   LDLCALC 0 - 99 mg/dL - - - - -   LDLDIRECT mg/dL - - - - -   HDL >39.00 mg/dL - - - - -   Trlycerides 0.0 - 149.0 mg/dL - - - - -   Hemoglobin A1c 4.8 - 5.6 % - - 7.0(H) - -   PHART 7.350 - 7.450 7.364 - 7.389 - -   PCO2ART 32.0 - 48.0 mmHg 42.4 - 36.6 - -   HCO3 20.0 - 28.0 mmol/L 24.2 24.5 21.6 - -   TCO2 22 - 32 mmol/L 25 26 - 25 22   ACIDBASEDEF 0.0 - 2.0 mmol/L 1.0 1.0 2.5(H) - -   O2SAT % 99.0 78.0 97.6 - -       Capillary Blood Glucose: Lab Results  Component Value Date   GLUCAP 224 (H) 11/14/2020   GLUCAP 153 (H) 11/05/2020   GLUCAP 163 (H) 11/03/2020   GLUCAP 238 (H) 11/03/2020   GLUCAP 129 (H) 08/13/2020     Exercise Target Goals: Exercise Program Goal: Individual exercise prescription set using results from initial 6 min walk test and THRR while considering  patient's activity barriers and safety.   Exercise Prescription Goal: Initial exercise prescription builds to 30-45 minutes a day of aerobic activity, 2-3 days per week.  Home exercise guidelines will be given to patient during program as part of exercise prescription that the participant will acknowledge.  Activity Barriers & Risk Stratification:  Activity Barriers & Cardiac Risk Stratification - 10/28/20 1047       Activity Barriers & Cardiac Risk Stratification   Activity Barriers Right Knee Replacement;Joint  Problems;Deconditioning;Muscular Weakness;Shortness of Breath;Balance Concerns    Cardiac Risk Stratification High             6 Minute Walk:  6 Minute Walk     Row Name 10/28/20 1000         6 Minute Walk     Phase Initial     Distance 948 feet     Walk Time 6 minutes     # of Rest Breaks 0     MPH 1.8     METS 1.87     RPE 13     Perceived Dyspnea  2     VO2 Peak 6.53     Symptoms Yes (comment)     Comments SOB, RPD = 2, left hip/buttock pain 3/10     Resting HR 74 bpm     Resting BP 118/74     Resting Oxygen Saturation  97 %     Exercise Oxygen Saturation  during 6 min walk 96 %     Max Ex. HR 91 bpm     Max Ex. BP 132/70     2 Minute Post BP 114/66              Oxygen Initial Assessment:   Oxygen Re-Evaluation:   Oxygen Discharge (Final Oxygen Re-Evaluation):   Initial Exercise Prescription:  Initial Exercise Prescription - 10/28/20 1000       Date of Initial Exercise RX and Referring Provider   Date 10/28/20    Referring Provider Kenneth Hilty, MD    Expected Discharge Date 12/26/20      NuStep   Level 2    SPM 75    Minutes 25    METs 2      Prescription Details   Frequency (times per week) 3    Duration Progress to 30 minutes of continuous aerobic without signs/symptoms of physical distress      Intensity   THRR 40-80% of Max Heartrate 58-116    Ratings of Perceived Exertion 11-13    Perceived Dyspnea 0-4      Progression   Progression Continue progressive overload as per policy without signs/symptoms or physical distress.      Resistance Training   Training Prescription Yes    Weight 75.7 kg    Reps 10-15             Perform Capillary Blood Glucose checks as needed.  Exercise Prescription Changes:   Exercise Prescription Changes     Row Name 11/03/20 1507 11/14/20 1620 12/01/20 1619         Response to Exercise   Blood Pressure (Admit) 140/70 132/70 130/70     Blood Pressure (Exercise) 144/78 132/62 122/70      Blood Pressure (Exit) 122/70 118/62 112/70     Heart Rate (Admit) 87 bpm 93 bpm 94 bpm     Heart Rate (Exercise) 100 bpm 92 bpm 90 bpm     Heart Rate (Exit) 69 bpm 81 bpm 74 bpm     Rating of Perceived Exertion (Exercise) 10 9 11     Perceived Dyspnea (Exercise) 0 0 0     Symptoms None None None     Comments Off to a good start with exercise. Reviewed MET's goals and home Ex Rx Reviewed MET's     Duration Progress to 30 minutes of  aerobic without signs/symptoms of physical distress Progress to 30 minutes of  aerobic without signs/symptoms of physical distress Progress to 30 minutes of  aerobic without signs/symptoms of physical distress     Intensity THRR unchanged THRR unchanged THRR unchanged       Progression   Progression Continue to progress workloads to maintain intensity without signs/symptoms of physical distress. Continue to progress workloads to maintain intensity without signs/symptoms of physical distress. Continue to   progress workloads to maintain intensity without signs/symptoms of physical distress.     Average METs 1.8 2.1 2       Resistance Training   Training Prescription Yes Yes Yes     Weight 3 lbs 3 lbs 3 lbs     Reps 10-15 10-15 10-15     Time 10 Minutes 10 Minutes 10 Minutes       Interval Training   Interval Training No No No       NuStep   Level _0 SPM 75 85 85     Minutes _1 METs 1.8 2.1 2       Home Exercise Plan   Plans to continue exercise at -- Home (comment) Home (comment)     Frequency -- Add 2 additional days to program exercise sessions. Add 2 additional days to program exercise sessions.     Initial Home Exercises Provided -- 11/14/20 11/14/20              Exercise Comments:   Exercise Comments     Row Name 11/03/20 1357 11/14/20 1627 12/01/20 1621       Exercise Comments Patient tolerated low intensity exercise well without symptoms. Reviewed MET's, goals and home Ex Rx with pt today. Pt is tolerating exercise well  with an average MET level of 2.1. Pt feels like he is off to a good start with his goals of increasing strength and stamina. Pt will add in 1-2 days of walking for 30 minutes a session at home to help him reach his goals. Will continue to monitor pt and progress workloads as tolerated without sign or symptom Reviewed MET's.  Pt is tolerating exercise well with an average MET level of 2.0. Will continue to monitor pt and progress workloads as tolerated without sign or symptom              Exercise Goals and Review:   Exercise Goals     Row Name 10/28/20 1047             Exercise Goals   Increase Physical Activity Yes       Intervention Provide advice, education, support and counseling about physical activity/exercise needs.;Develop an individualized exercise prescription for aerobic and resistive training based on initial evaluation findings, risk stratification, comorbidities and participant's personal goals.       Expected Outcomes Long Term: Exercising regularly at least 3-5 days a week.;Short Term: Attend rehab on a regular basis to increase amount of physical activity.;Long Term: Add in home exercise to make exercise part of routine and to increase amount of physical activity.       Increase Strength and Stamina Yes       Intervention Provide advice, education, support and counseling about physical activity/exercise needs.;Develop an individualized exercise prescription for aerobic and resistive training based on initial evaluation findings, risk stratification, comorbidities and participant's personal goals.       Expected Outcomes Short Term: Increase workloads from initial exercise prescription for resistance, speed, and METs.;Short Term: Perform resistance training exercises routinely during rehab and add in resistance training at home;Long Term: Improve cardiorespiratory fitness, muscular endurance and strength as measured by increased METs and functional capacity (6MWT)       Able  to understand and use rate of perceived exertion (RPE) scale Yes       Intervention Provide education and explanation on how to use RPE scale  Expected Outcomes Short Term: Able to use RPE daily in rehab to express subjective intensity level;Long Term:  Able to use RPE to guide intensity level when exercising independently       Knowledge and understanding of Target Heart Rate Range (THRR) Yes       Intervention Provide education and explanation of THRR including how the numbers were predicted and where they are located for reference       Expected Outcomes Short Term: Able to state/look up THRR;Short Term: Able to use daily as guideline for intensity in rehab;Long Term: Able to use THRR to govern intensity when exercising independently       Understanding of Exercise Prescription Yes       Intervention Provide education, explanation, and written materials on patient's individual exercise prescription       Expected Outcomes Short Term: Able to explain program exercise prescription;Long Term: Able to explain home exercise prescription to exercise independently                Exercise Goals Re-Evaluation :  Exercise Goals Re-Evaluation     Row Name 11/03/20 1357 11/14/20 1622 12/01/20 1620         Exercise Goal Re-Evaluation   Exercise Goals Review Increase Physical Activity;Able to understand and use rate of perceived exertion (RPE) scale Increase Physical Activity;Increase Strength and Stamina;Able to understand and use rate of perceived exertion (RPE) scale;Knowledge and understanding of Target Heart Rate Range (THRR);Understanding of Exercise Prescription Increase Physical Activity;Increase Strength and Stamina;Able to understand and use rate of perceived exertion (RPE) scale;Knowledge and understanding of Target Heart Rate Range (THRR);Understanding of Exercise Prescription     Comments Patient able to understand and use RPE scale appropriately. Reviewed MET's, goals and home Ex Rx  with pt today. Pt is tolerating exercise well with an average MET level of 2.1. Pt feels like he is off to a good start with his goals of increasing strength and stamina. Pt will add in 1-2 days of walking for 30 minutes a session at home to help him reach his goals. Reviewed MET's.  Pt is tolerating exercise well with an average MET level of 2.0.     Expected Outcomes Progress workloads as tolerated to help increase cardiorespiratory fitness. Will continue to monitor pt and progress workloads as tolerated without sign or symptom. Will continue to monitor pt and progress workloads as tolerated without sign or symptom.              Discharge Exercise Prescription (Final Exercise Prescription Changes):  Exercise Prescription Changes - 12/01/20 1619       Response to Exercise   Blood Pressure (Admit) 130/70    Blood Pressure (Exercise) 122/70    Blood Pressure (Exit) 112/70    Heart Rate (Admit) 94 bpm    Heart Rate (Exercise) 90 bpm    Heart Rate (Exit) 74 bpm    Rating of Perceived Exertion (Exercise) 11    Perceived Dyspnea (Exercise) 0    Symptoms None    Comments Reviewed MET's    Duration Progress to 30 minutes of  aerobic without signs/symptoms of physical distress    Intensity THRR unchanged      Progression   Progression Continue to progress workloads to maintain intensity without signs/symptoms of physical distress.    Average METs 2      Resistance Training   Training Prescription Yes    Weight 3 lbs    Reps 10-15    Time 10 Minutes        Interval Training   Interval Training No      NuStep   Level 2    SPM 85    Minutes 25    METs 2      Home Exercise Plan   Plans to continue exercise at Home (comment)    Frequency Add 2 additional days to program exercise sessions.    Initial Home Exercises Provided 11/14/20             Nutrition:  Target Goals: Understanding of nutrition guidelines, daily intake of sodium <1500mg, cholesterol <200mg, calories 30%  from fat and 7% or less from saturated fats, daily to have 5 or more servings of fruits and vegetables.  Biometrics:  Pre Biometrics - 10/28/20 0930       Pre Biometrics   Waist Circumference 39 inches    Hip Circumference 39 inches    Waist to Hip Ratio 1 %    Triceps Skinfold 12 mm    % Body Fat 29.1 %    Grip Strength 22 kg    Flexibility 0 in   Could not reach   Single Leg Stand 14.18 seconds              Nutrition Therapy Plan and Nutrition Goals:   Nutrition Assessments:  MEDIFICTS Score Key: ?70 Need to make dietary changes  40-70 Heart Healthy Diet ? 40 Therapeutic Level Cholesterol Diet    Picture Your Plate Scores: <40 Unhealthy dietary pattern with much room for improvement. 41-50 Dietary pattern unlikely to meet recommendations for good health and room for improvement. 51-60 More healthful dietary pattern, with some room for improvement.  >60 Healthy dietary pattern, although there may be some specific behaviors that could be improved.    Nutrition Goals Re-Evaluation:   Nutrition Goals Re-Evaluation:   Nutrition Goals Discharge (Final Nutrition Goals Re-Evaluation):   Psychosocial: Target Goals: Acknowledge presence or absence of significant depression and/or stress, maximize coping skills, provide positive support system. Participant is able to verbalize types and ability to use techniques and skills needed for reducing stress and depression.  Initial Review & Psychosocial Screening:  Initial Psych Review & Screening - 10/28/20 0958       Initial Review   Current issues with None Identified      Family Dynamics   Good Support System? Yes   Bill has his wife and two children who live in the area for support     Barriers   Psychosocial barriers to participate in program There are no identifiable barriers or psychosocial needs.      Screening Interventions   Interventions Encouraged to exercise             Quality of Life Scores:   Quality of Life - 10/28/20 1044       Quality of Life   Select Quality of Life      Quality of Life Scores   Health/Function Pre 19.83 %    Socioeconomic Pre 20.29 %    Psych/Spiritual Pre 22.5 %    Family Pre 21.1 %    GLOBAL Pre 20.66 %            Scores of 19 and below usually indicate a poorer quality of life in these areas.  A difference of  2-3 points is a clinically meaningful difference.  A difference of 2-3 points in the total score of the Quality of Life Index has been associated with significant improvement in overall quality of life, self-image, physical symptoms,   and general health in studies assessing change in quality of life.  PHQ-9: Recent Review Flowsheet Data     Depression screen [facility] 2/9 10/28/2020 05/19/2020 03/13/2019 02/20/2018 02/15/2017   Decreased Interest 0 0 0 0 0   Down, Depressed, Hopeless 0 0 0 0 0   PHQ - 2 Score 0 0 0 0 0      Interpretation of Total Score  Total Score Depression Severity:  1-4 = Minimal depression, 5-9 = Mild depression, 10-14 = Moderate depression, 15-19 = Moderately severe depression, 20-27 = Severe depression   Psychosocial Evaluation and Intervention:   Psychosocial Re-Evaluation:  Psychosocial Re-Evaluation     [place] Name 11/04/20 1436 11/14/20 1807 12/09/20 1353         Psychosocial Re-Evaluation   Current issues with None Identified None Identified None Identified     Comments Reviewed QOL. -- --     Expected Outcomes Will continue to monitor and offer support as needed Will continue to monitor and offer support as needed Will continue to monitor and offer support as needed     Interventions Encouraged to attend Cardiac Rehabilitation for the exercise Encouraged to attend Cardiac Rehabilitation for the exercise Encouraged to attend Cardiac Rehabilitation for the exercise     Continue Psychosocial Services  No Follow up required No Follow up required No Follow up required              Psychosocial Discharge  (Final Psychosocial Re-Evaluation):  Psychosocial Re-Evaluation - 12/09/20 1353       Psychosocial Re-Evaluation   Current issues with None Identified    Expected Outcomes Will continue to monitor and offer support as needed    Interventions Encouraged to attend Cardiac Rehabilitation for the exercise    Continue Psychosocial Services  No Follow up required             Vocational Rehabilitation: Provide vocational rehab assistance to qualifying candidates.   Vocational Rehab Evaluation & Intervention:  Vocational Rehab - 10/28/20 0959       Initial Vocational Rehab Evaluation & Intervention   Assessment shows need for Vocational Rehabilitation No   Rush [employer]mark is retired and does not need vocational rehab at this time            Education: Education Goals: Education classes will be provided on a weekly basis, covering required topics. Participant will state understanding/return demonstration of topics presented.  Learning Barriers/Preferences:  Learning Barriers/Preferences - 10/28/20 1038       Learning Barriers/Preferences   Learning Barriers Sight   wears glasses   Learning Preferences Audio;Written Material;Computer/Internet;Group Instruction;Individual Instruction;Pictoral;Skilled Demonstration;Verbal Instruction;Video             Education Topics: Count Your Pulse:  -Group instruction provided by verbal instruction, demonstration, patient participation and written materials to support subject.  Instructors address importance of being able to find your pulse and how to count your pulse when at home without a heart monitor.  Patients get hands on experience counting their pulse with staff help and individually.   Heart Attack, Angina, and Risk Factor Modification:  -Group instruction provided by verbal instruction, video, and written materials to support subject.  Instructors address signs and symptoms of angina and heart attacks.    Also discuss risk factors for  heart disease and how to make changes to improve heart health risk factors.   Functional Fitness:  -Group instruction provided by verbal instruction, demonstration, patient participation, and written materials to support subject.  Instructors address safety  measures for doing things around the house.  Discuss how to get up and down off the floor, how to pick things up properly, how to safely get out of a chair without assistance, and balance training.   Meditation and Mindfulness:  -Group instruction provided by verbal instruction, patient participation, and written materials to support subject.  Instructor addresses importance of mindfulness and meditation practice to help reduce stress and improve awareness.  Instructor also leads participants through a meditation exercise.    Stretching for Flexibility and Mobility:  -Group instruction provided by verbal instruction, patient participation, and written materials to support subject.  Instructors lead participants through series of stretches that are designed to increase flexibility thus improving mobility.  These stretches are additional exercise for major muscle groups that are typically performed during regular warm up and cool down.   Hands Only CPR:  -Group verbal, video, and participation provides a basic overview of AHA guidelines for community CPR. Role-play of emergencies allow participants the opportunity to practice calling for help and chest compression technique with discussion of AED use.   Hypertension: -Group verbal and written instruction that provides a basic overview of hypertension including the most recent diagnostic guidelines, risk factor reduction with self-care instructions and medication management.    Nutrition I class: Heart Healthy Eating:  -Group instruction provided by PowerPoint slides, verbal discussion, and written materials to support subject matter. The instructor gives an explanation and review of the  Therapeutic Lifestyle Changes diet recommendations, which includes a discussion on lipid goals, dietary fat, sodium, fiber, plant stanol/sterol esters, sugar, and the components of a well-balanced, healthy diet.   Nutrition II class: Lifestyle Skills:  -Group instruction provided by PowerPoint slides, verbal discussion, and written materials to support subject matter. The instructor gives an explanation and review of label reading, grocery shopping for heart health, heart healthy recipe modifications, and ways to make healthier choices when eating out.   Diabetes Question & Answer:  -Group instruction provided by PowerPoint slides, verbal discussion, and written materials to support subject matter. The instructor gives an explanation and review of diabetes co-morbidities, pre- and post-prandial blood glucose goals, pre-exercise blood glucose goals, signs, symptoms, and treatment of hypoglycemia and hyperglycemia, and foot care basics.   Diabetes Blitz:  -Group instruction provided by PowerPoint slides, verbal discussion, and written materials to support subject matter. The instructor gives an explanation and review of the physiology behind type 1 and type 2 diabetes, diabetes medications and rational behind using different medications, pre- and post-prandial blood glucose recommendations and Hemoglobin A1c goals, diabetes diet, and exercise including blood glucose guidelines for exercising safely.    Portion Distortion:  -Group instruction provided by PowerPoint slides, verbal discussion, written materials, and food models to support subject matter. The instructor gives an explanation of serving size versus portion size, changes in portions sizes over the last 20 years, and what consists of a serving from each food group.   Stress Management:  -Group instruction provided by verbal instruction, video, and written materials to support subject matter.  Instructors review role of stress in heart  disease and how to cope with stress positively.     Exercising on Your Own:  -Group instruction provided by verbal instruction, power point, and written materials to support subject.  Instructors discuss benefits of exercise, components of exercise, frequency and intensity of exercise, and end points for exercise.  Also discuss use of nitroglycerin and activating EMS.  Review options of places to exercise outside of rehab.  Review guidelines for sex with heart disease.   Cardiac Drugs I:  -Group instruction provided by verbal instruction and written materials to support subject.  Instructor reviews cardiac drug classes: antiplatelets, anticoagulants, beta blockers, and statins.  Instructor discusses reasons, side effects, and lifestyle considerations for each drug class.   Cardiac Drugs II:  -Group instruction provided by verbal instruction and written materials to support subject.  Instructor reviews cardiac drug classes: angiotensin converting enzyme inhibitors (ACE-I), angiotensin II receptor blockers (ARBs), nitrates, and calcium channel blockers.  Instructor discusses reasons, side effects, and lifestyle considerations for each drug class.   Anatomy and Physiology of the Circulatory System:  Group verbal and written instruction and models provide basic cardiac anatomy and physiology, with the coronary electrical and arterial systems. Review of: AMI, Angina, Valve disease, Heart Failure, Peripheral Artery Disease, Cardiac Arrhythmia, Pacemakers, and the ICD.   Other Education:  -Group or individual verbal, written, or video instructions that support the educational goals of the cardiac rehab program.   Holiday Eating Survival Tips:  -Group instruction provided by PowerPoint slides, verbal discussion, and written materials to support subject matter. The instructor gives patients tips, tricks, and techniques to help them not only survive but enjoy the holidays despite the onslaught of food  that accompanies the holidays.   Knowledge Questionnaire Score:  Knowledge Questionnaire Score - 10/28/20 1116       Knowledge Questionnaire Score   Pre Score 21/24             Core Components/Risk Factors/Patient Goals at Admission:  Personal Goals and Risk Factors at Admission - 10/28/20 1037       Core Components/Risk Factors/Patient Goals on Admission    Weight Management Yes;Obesity;Weight Loss    Intervention Weight Management: Develop a combined nutrition and exercise program designed to reach desired caloric intake, while maintaining appropriate intake of nutrient and fiber, sodium and fats, and appropriate energy expenditure required for the weight goal.;Weight Management: Provide education and appropriate resources to help participant work on and attain dietary goals.;Weight Management/Obesity: Establish reasonable short term and long term weight goals.;Obesity: Provide education and appropriate resources to help participant work on and attain dietary goals.    Admit Weight 166 lb 14.2 oz (75.7 kg)    Expected Outcomes Short Term: Continue to assess and modify interventions until short term weight is achieved;Long Term: Adherence to nutrition and physical activity/exercise program aimed toward attainment of established weight goal;Weight Maintenance: Understanding of the daily nutrition guidelines, which includes 25-35% calories from fat, 7% or less cal from saturated fats, less than 200mg cholesterol, less than 1.5gm of sodium, & 5 or more servings of fruits and vegetables daily;Weight Loss: Understanding of general recommendations for a balanced deficit meal plan, which promotes 1-2 lb weight loss per week and includes a negative energy balance of 500-1000 kcal/d;Understanding recommendations for meals to include 15-35% energy as protein, 25-35% energy from fat, 35-60% energy from carbohydrates, less than 200mg of dietary cholesterol, 20-35 gm of total fiber daily;Understanding of  distribution of calorie intake throughout the day with the consumption of 4-5 meals/snacks    Diabetes Yes    Intervention Provide education about signs/symptoms and action to take for hypo/hyperglycemia.;Provide education about proper nutrition, including hydration, and aerobic/resistive exercise prescription along with prescribed medications to achieve blood glucose in normal ranges: Fasting glucose 65-99 mg/dL    Expected Outcomes Short Term: Participant verbalizes understanding of the signs/symptoms and immediate care of hyper/hypoglycemia, proper foot care and importance of medication, aerobic/resistive   exercise and nutrition plan for blood glucose control.;Long Term: Attainment of HbA1C < 7%.    Hypertension Yes    Intervention Provide education on lifestyle modifcations including regular physical activity/exercise, weight management, moderate sodium restriction and increased consumption of fresh fruit, vegetables, and low fat dairy, alcohol moderation, and smoking cessation.;Monitor prescription use compliance.    Expected Outcomes Short Term: Continued assessment and intervention until BP is < 140/60m HG in hypertensive participants. < 130/854mHG in hypertensive participants with diabetes, heart failure or chronic kidney disease.;Long Term: Maintenance of blood pressure at goal levels.    Lipids Yes    Intervention Provide education and support for participant on nutrition & aerobic/resistive exercise along with prescribed medications to achieve LDL <7026mHDL >31m29m  Expected Outcomes Short Term: Participant states understanding of desired cholesterol values and is compliant with medications prescribed. Participant is following exercise prescription and nutrition guidelines.;Long Term: Cholesterol controlled with medications as prescribed, with individualized exercise RX and with personalized nutrition plan. Value goals: LDL < 70mg23mL > 40 mg.             Core Components/Risk  Factors/Patient Goals Review:   Goals and Risk Factor Review     Row Name 11/04/20 1437 11/14/20 1807 12/09/20 1354         Core Components/Risk Factors/Patient Goals Review   Personal Goals Review Weight Management/Obesity;Hypertension;Diabetes;Lipids Weight Management/Obesity;Hypertension;Diabetes;Lipids Weight Management/Obesity;Hypertension;Diabetes;Lipids     Review Bill started exercise at cardiac rehab on 11/03/20 and did well with exercise. Vital signs and CBG's were stable. Bill Rush Landmarkbeen doing well with exercise. Vital sings have been stable. CBG's have been variable. Will continue to monitor. Bill Rush Landmarkinues to do well with exercise. Vital sings have been stable. CBG's have recently been below 200. Weight has been stable.     Expected Outcomes Bill will continue to partcipate in phase 2 cardiac rehab for exercise, nutrition and life syle modificaytions. Bill [place] continue to partcipate in phase 2 cardiac rehab for exercise, nutrition and life syle modificaytions. Bill Rush [place] continue to partcipate in phase 2 cardiac rehab for exercise, nutrition and life syle modificaytions. Bill Rush [place] complete phase 2 cardiac rehab on 12/26/20.              Core Components/Risk Factors/Patient Goals at Discharge (Final Review):   Goals and Risk Factor Review - 12/09/20 1354       Core Components/Risk Factors/Patient Goals Review   Personal Goals Review Weight Management/Obesity;Hypertension;Diabetes;Lipids    Review Bill Rush Landmarkinues to do well with exercise. Vital sings have been stable. CBG's have recently been below 200. Weight has been stable.    Expected Outcomes Bill will continue to partcipate in phase 2 cardiac rehab for exercise, nutrition and life syle modificaytions. Bill Rush [place] complete phase 2 cardiac rehab on 12/26/20.             ITP Comments:  ITP Comments     Row Name 10/28/20 0957 11/04/20 1434 11/14/20 1805 12/09/20 1352     ITP Comments Dr TraciFransico HimMedical Director  30 Day ITP Review. Bill started exercise at cardiac rehab on 11/03/20 and did well with exercise. 30 Day ITP Review. Bill Rush Landmarkgood attendance and participation in cardiac rehab and is off to a good start to exercise. 30 Day ITP Review. Bill Rush Landmarkgood attendance and participation in cardiac rehab and continues to do well with exercise.             Comments: See ITP Comments

## 2020-12-12 ENCOUNTER — Encounter (HOSPITAL_COMMUNITY)
Admission: RE | Admit: 2020-12-12 | Discharge: 2020-12-12 | Disposition: A | Discharge status: Home / Self Care | Payer: Medicare Other | Source: Ambulatory Visit | Attending: Internal Medicine | Admitting: Internal Medicine

## 2020-12-12 ENCOUNTER — Other Ambulatory Visit: Payer: Self-pay

## 2020-12-12 DIAGNOSIS — Z952 Presence of prosthetic heart valve: Secondary | ICD-10-CM

## 2020-12-15 ENCOUNTER — Other Ambulatory Visit: Payer: Self-pay

## 2020-12-15 ENCOUNTER — Encounter (HOSPITAL_COMMUNITY)
Admission: RE | Admit: 2020-12-15 | Discharge: 2020-12-15 | Disposition: A | Discharge status: Home / Self Care | Payer: Medicare Other | Source: Ambulatory Visit | Attending: Internal Medicine | Admitting: Internal Medicine

## 2020-12-15 DIAGNOSIS — Z952 Presence of prosthetic heart valve: Secondary | ICD-10-CM

## 2020-12-17 ENCOUNTER — Encounter (HOSPITAL_COMMUNITY)
Admission: RE | Admit: 2020-12-17 | Discharge: 2020-12-17 | Disposition: A | Discharge status: Home / Self Care | Payer: Medicare Other | Source: Ambulatory Visit | Attending: Internal Medicine | Admitting: Internal Medicine

## 2020-12-17 ENCOUNTER — Other Ambulatory Visit: Payer: Self-pay

## 2020-12-17 DIAGNOSIS — Z952 Presence of prosthetic heart valve: Secondary | ICD-10-CM | POA: Diagnosis not present

## 2020-12-19 ENCOUNTER — Encounter (HOSPITAL_COMMUNITY)
Admission: RE | Admit: 2020-12-19 | Discharge: 2020-12-19 | Disposition: A | Discharge status: Home / Self Care | Payer: Medicare Other | Source: Ambulatory Visit | Attending: Internal Medicine | Admitting: Internal Medicine

## 2020-12-19 ENCOUNTER — Ambulatory Visit (INDEPENDENT_AMBULATORY_CARE_PROVIDER_SITE_OTHER): Payer: Medicare Other

## 2020-12-19 ENCOUNTER — Other Ambulatory Visit: Payer: Self-pay

## 2020-12-19 VITALS — BP 124/68 | Temp 97.7°F | Ht 67.0 in | Wt 165.2 lb

## 2020-12-19 DIAGNOSIS — Z0000 Encounter for general adult medical examination without abnormal findings: Secondary | ICD-10-CM | POA: Diagnosis not present

## 2020-12-19 DIAGNOSIS — Z952 Presence of prosthetic heart valve: Secondary | ICD-10-CM

## 2020-12-19 NOTE — Patient Instructions (Addendum)
Daryl Webb , Thank you for taking time to come for your Medicare Wellness Visit. I appreciate your ongoing commitment to your health goals. Please review the following plan we discussed and let me know if I can assist you in the future.   These are the goals we discussed:  Goals      Chronic Care Management     CARE PLAN ENTRY  Current Barriers:  Chronic Disease Management support, education, and care coordination needs related to Hypertension, Hyperlipidemia, and Diabetes   Hypertension BP Readings from Last 3 Encounters:  07/04/19 130/74  05/18/19 110/70  04/11/19 134/70  Pharmacist Clinical Goal(s): Over the next 120 days, patient will work with PharmD and providers to maintain BP goal <130/80 Current regimen:  Amlodipine 5 mg 1 tablet daily Losartan/HCTZ 50-12.5 mg 1 tablet daily Metoprolol succinate 50 mg 1 tablet daily Interventions: Discussed low salt diet and exercising as tolerated extensively Patient self care activities - Over the next 120 days, patient will: Check BP 2-3 times aw eek, document, and provide at future appointments Ensure daily salt intake < 2300 mg/day  Hyperlipidemia Lab Results  Component Value Date/Time   LDLCALC 43 06/28/2017 09:15 AM   LDLDIRECT 73.8 11/20/2012 08:40 AM  Pharmacist Clinical Goal(s): Over the next 120 days, patient will work with PharmD and providers to maintain LDL goal < 100 Current regimen:  Atorvastatin 40 mg 1 tablet daily  Interventions: Discussed low cholesterol diet and exercising as tolerated extensively Patient self care activities - Over the next 120 days, patient will: Continue eating heart healthy diet  Diabetes Lab Results  Component Value Date/Time   HGBA1C 6.7 (H) 06/19/2015 09:06 AM   HGBA1C 7.1 (H) 01/06/2015 08:22 AM  Pharmacist Clinical Goal(s): Over the next 120 days, patient will work with PharmD and providers to maintain A1c goal <7% Current regimen:  Bydureon 2 mg pen inject 1 dose into  the skin once a week Metformin 500 mg 1 tablet twice daily Interventions: Discussed carbohydrate counting and exercising as tolerated extensively Patient self care activities - Over the next 120 days, patient will: Check blood sugar once daily, document, and provide at future appointments Contact provider with any episodes of hypoglycemia  Medication management Pharmacist Clinical Goal(s): Over the next 120 days, patient will work with PharmD and providers to maintain optimal medication adherence Current pharmacy: [facility] Interventions Comprehensive medication review performed. Continue current medication management strategy Patient self care activities - Over the next 120 days, patient will: Focus on medication adherence by using his own system every morning Take medications as prescribed Report any questions or concerns to PharmD and/or provider(s)  Initial goal documentation      DIET - INCREASE WATER INTAKE     Exercise 150 min/wk Moderate Activity     Will try to walk more;  Try to walk 1 mile  per day your neighborhood.          This is a list of the screening recommended for you and due dates:  Health Maintenance  Topic Date Due   Colon Cancer Screening  12/19/2021*   Tetanus Vaccine  12/19/2021*   COVID-19 Vaccine (5 - Booster for Pfizer series) 01/04/2021   Hemoglobin A1C  02/08/2021   Eye exam for diabetics  05/09/2021   Complete foot exam   06/08/2021   Pneumonia Vaccine  Completed   Flu Shot  Completed   Hepatitis C Screening: USPSTF Recommendation to screen - Ages 18-79 yo.  Completed   Zoster (Shingles)  Vaccine  Completed   HPV Vaccine  Aged Out  *Topic was postponed. The date shown is not the original due date.    Advanced directives: Yes   Conditions/risks identified: None Next appointment: Follow up in one year for your annual wellness visit.   Preventive Care 6 Years and Older, Male Preventive care refers to lifestyle choices and visits  with your health care provider that can promote health and wellness. What does preventive care include? A yearly physical exam. This is also called an annual well check. Dental exams once or twice a year. Routine eye exams. Ask your health care provider how often you should have your eyes checked. Personal lifestyle choices, including: Daily care of your teeth and gums. Regular physical activity. Eating a healthy diet. Avoiding tobacco and drug use. Limiting alcohol use. Practicing safe sex. Taking low doses of aspirin every day. Taking vitamin and mineral supplements as recommended by your health care provider. What happens during an annual well check? The services and screenings done by your health care provider during your annual well check will depend on your age, overall health, lifestyle risk factors, and family history of disease. Counseling  Your health care provider may ask you questions about your: Alcohol use. Tobacco use. Drug use. Emotional well-being. Home and relationship well-being. Sexual activity. Eating habits. History of falls. Memory and ability to understand (cognition). Work and work Statistician. Screening  You may have the following tests or measurements: Height, weight, and BMI. Blood pressure. Lipid and cholesterol levels. These may be checked every 5 years, or more frequently if you are over 35 years old. Skin check. Lung cancer screening. You may have this screening every year starting at age 28 if you have a 30-pack-year history of smoking and currently smoke or have quit within the past 15 years. Fecal occult blood test (FOBT) of the stool. You may have this test every year starting at age 35. Flexible sigmoidoscopy or colonoscopy. You may have a sigmoidoscopy every 5 years or a colonoscopy every 10 years starting at age 55. Prostate cancer screening. Recommendations will vary depending on your family history and other risks. Hepatitis C blood  test. Hepatitis B blood test. Sexually transmitted disease (STD) testing. Diabetes screening. This is done by checking your blood sugar (glucose) after you have not eaten for a while (fasting). You may have this done every 1-3 years. Abdominal aortic aneurysm (AAA) screening. You may need this if you are a current or former smoker. Osteoporosis. You may be screened starting at age 41 if you are at high risk. Talk with your health care provider about your test results, treatment options, and if necessary, the need for more tests. Vaccines  Your health care provider may recommend certain vaccines, such as: Influenza vaccine. This is recommended every year. Tetanus, diphtheria, and acellular pertussis (Tdap, Td) vaccine. You may need a Td booster every 10 years. Zoster vaccine. You may need this after age 85. Pneumococcal 13-valent conjugate (PCV13) vaccine. One dose is recommended after age 79. Pneumococcal polysaccharide (PPSV23) vaccine. One dose is recommended after age 81. Talk to your health care provider about which screenings and vaccines you need and how often you need them. This information is not intended to replace advice given to you by your health care provider. Make sure you discuss any questions you have with your health care provider. Document Released: 02/21/2015 Document Revised: 10/15/2015 Document Reviewed: 11/26/2014 Elsevier Interactive Patient Education  2017 [place] Prevention in the  Home Falls can cause injuries. They can happen to people of all ages. There are many things you can do to make your home safe and to help prevent falls. What can I do on the outside of my home? Regularly fix the edges of walkways and driveways and fix any cracks. Remove anything that might make you trip as you walk through a door, such as a raised step or threshold. Trim any bushes or trees on the path to your home. Use bright outdoor lighting. Clear any walking paths of  anything that might make someone trip, such as rocks or tools. Regularly check to see if handrails are loose or broken. Make sure that both sides of any steps have handrails. Any raised decks and porches should have guardrails on the edges. Have any leaves, snow, or ice cleared regularly. Use sand or salt on walking paths during winter. Clean up any spills in your garage right away. This includes oil or grease spills. What can I do in the bathroom? Use night lights. Install grab bars by the toilet and in the tub and shower. Do not use towel bars as grab bars. Use non-skid mats or decals in the tub or shower. If you need to sit down in the shower, use a plastic, non-slip stool. Keep the floor dry. Clean up any water that spills on the floor as soon as it happens. Remove soap buildup in the tub or shower regularly. Attach bath mats securely with double-sided non-slip rug tape. Do not have throw rugs and other things on the floor that can make you trip. What can I do in the bedroom? Use night lights. Make sure that you have a light by your bed that is easy to reach. Do not use any sheets or blankets that are too big for your bed. They should not hang down onto the floor. Have a firm chair that has side arms. You can use this for support while you get dressed. Do not have throw rugs and other things on the floor that can make you trip. What can I do in the kitchen? Clean up any spills right away. Avoid walking on wet floors. Keep items that you use a lot in easy-to-reach places. If you need to reach something above you, use a strong step stool that has a grab bar. Keep electrical cords out of the way. Do not use floor polish or wax that makes floors slippery. If you must use wax, use non-skid floor wax. Do not have throw rugs and other things on the floor that can make you trip. What can I do with my stairs? Do not leave any items on the stairs. Make sure that there are handrails on both  sides of the stairs and use them. Fix handrails that are broken or loose. Make sure that handrails are as long as the stairways. Check any carpeting to make sure that it is firmly attached to the stairs. Fix any carpet that is loose or worn. Avoid having throw rugs at the top or bottom of the stairs. If you do have throw rugs, attach them to the floor with carpet tape. Make sure that you have a light switch at the top of the stairs and the bottom of the stairs. If you do not have them, ask someone to add them for you. What else can I do to help prevent falls? Wear shoes that: Do not have high heels. Have rubber bottoms. Are comfortable and fit you well. Are closed  at the toe. Do not wear sandals. If you use a stepladder: Make sure that it is fully opened. Do not climb a closed stepladder. Make sure that both sides of the stepladder are locked into place. Ask someone to hold it for you, if possible. Clearly mark and make sure that you can see: Any grab bars or handrails. First and last steps. Where the edge of each step is. Use tools that help you move around (mobility aids) if they are needed. These include: Canes. Walkers. Scooters. Crutches. Turn on the lights when you go into a dark area. Replace any light bulbs as soon as they burn out. Set up your furniture so you have a clear path. Avoid moving your furniture around. If any of your floors are uneven, fix them. If there are any pets around you, be aware of where they are. Review your medicines with your doctor. Some medicines can make you feel dizzy. This can increase your chance of falling. Ask your doctor what other things that you can do to help prevent falls. This information is not intended to replace advice given to you by your health care provider. Make sure you discuss any questions you have with your health care provider. Document Released: 11/21/2008 Document Revised: 07/03/2015 Document Reviewed: 03/01/2014 Elsevier  Interactive Patient Education  2017 [employer].

## 2020-12-19 NOTE — Progress Notes (Signed)
Subjective:   Daryl Webb is a 75 y.o. male who presents for Medicare Annual/Subsequent preventive examination.  Review of Systems    No ROS Cardiac Risk Factors include: advanced age (>16men, >57 women);diabetes mellitus;hypertension    Objective:    Today's Vitals   12/19/20 1036  BP: 124/68  Temp: 97.7 F (36.5 C)  TempSrc: Oral  Weight: 165 lb 3.2 oz (74.9 kg)  Height: 5\' 7"  (1.702 m)   Body mass index is 25.87 kg/m.  Advanced Directives 12/19/2020 08/12/2020 08/08/2020 07/30/2020 05/08/2020 03/13/2019 02/20/2018  Does Patient Have a Medical Advance Directive? Yes Yes Yes Yes Yes Yes Yes  Type of Paramedic of [place];Living will [place];Living will [place];Living will [place];Living will - [place];Living will [place];Living will  Does patient want to make changes to medical advance directive? No - Patient declined No - Patient declined - - No - Patient declined No - Patient declined No - Patient declined  Copy of [place] in Chart? No - copy requested Yes - validated most recent copy scanned in chart (See row information) Yes - validated most recent copy scanned in chart (See row information) Yes - validated most recent copy scanned in chart (See row information) - No - copy requested No - copy requested    Current Medications (verified) Outpatient Encounter Medications as of 12/19/2020  Medication Sig   ACCU-CHEK SMARTVIEW test strip Use to test blood sugars once daily   amLODipine (NORVASC) 5 MG tablet TAKE 1 TABLET DAILY   amoxicillin (AMOXIL) 500 MG capsule Take 2,000 mg by mouth See admin instructions. Take 2000 mg 1 hour prior to dental procedures   Ascorbic Acid (VITAMIN C) 1000 MG tablet Take 1,000 mg by mouth 2 (two) times a week.   aspirin 81 MG tablet Take 81 mg by mouth daily.   aspirin-acetaminophen-caffeine  (EXCEDRIN MIGRAINE) 250-250-65 MG tablet Take 2 tablets by mouth every 6 (six) hours as needed for headache.   atorvastatin (LIPITOR) 40 MG tablet TAKE 1 TABLET DAILY   BYDUREON BCISE 2 MG/0.85ML AUIJ Inject 2 mg into the skin every Thursday.   calcium carbonate (TUMS - DOSED IN MG ELEMENTAL CALCIUM) 500 MG chewable tablet Chew 2 tablets by mouth daily as needed for indigestion or heartburn.   Cholecalciferol 2000 UNITS CAPS Take 2,000 Units by mouth 2 (two) times a week.   clopidogrel (PLAVIX) 75 MG tablet Take 1 tablet (75 mg total) by mouth daily with breakfast.   clotrimazole (LOTRIMIN) 1 % cream Apply 1 application topically 2 (two) times daily as needed (jock itch).   Coenzyme Q10 (COQ10) 100 MG CAPS Take 100 mg by mouth 2 (two) times a week.   [employer] CINNAMON PO Take 1 capsule by mouth 2 (two) times a week.   cyanocobalamin (,VITAMIN B-12,) 1000 MCG/ML injection Inject 1,000 mcg into the muscle every 30 (thirty) days.   dapagliflozin propanediol (FARXIGA) 5 MG TABS tablet Take 5 mg by mouth daily.   diclofenac (VOLTAREN) 75 MG EC tablet TAKE 1 TABLET DAILY AS     NEEDED   diphenhydrAMINE HCl, Sleep, (ZZZQUIL) 25 MG CAPS Take 25 mg by mouth at bedtime.   losartan-hydrochlorothiazide (HYZAAR) 50-12.5 MG tablet TAKE 1 TABLET DAILY   metFORMIN (GLUCOPHAGE) 500 MG tablet Take 500 mg by mouth 2 (two) times daily.   metoprolol succinate (TOPROL-XL) 50 MG 24 hr tablet TAKE 1 TABLET DAILY  nystatin ointment (MYCOSTATIN) Apply 1 application topically 3 (three) times daily. (Patient taking differently: Apply 1 application topically 3 (three) times daily as needed (jock itch).)   OLIVE LEAF EXTRACT PO Take 600 mg by mouth 2 (two) times a week.   Omega-3 Fatty Acids (FISH OIL) 1000 MG CAPS Take 1,000 mg by mouth 2 (two) times a week.   QUERCETIN PO Take 500 mg by mouth 2 (two) times a week.   No facility-administered encounter medications on file as of 12/19/2020.    Allergies (verified) Patient has  no known allergies.   History: Past Medical History:  Diagnosis Date   Anemia    Arthritis    CKD (chronic kidney disease)    Constipation due to pain medication    needs stool softner while on pain medication   Diabetes mellitus    Duodenal ulcer 08/13/2015   2014  - Dr Kaplan/GI   H/O: osteoarthritis    History of kidney stones    History of prostate cancer    History of urethral stricture    Hypercholesterolemia    Hypertension    Prostate cancer ([place])    prostate   S/P TAVR (transcatheter aortic valve replacement) 08/12/2020   s/p TAVR with a 29 mm Edwards Sapien 3 THV via the TF approach by [name] and [name]   Severe aortic stenosis    Spinal stenosis    Ulcer duodenal hemorrhage    Past Surgical History:  Procedure Laterality Date   ARTHROPLASTY  2004   right knee [name] SURGERY     CARDIAC CATHETERIZATION     CARPAL TUNNEL RELEASE     right hand,wrist [name]   CYSTOSCOPY N/A 09/20/2012   Procedure: Consuela Mimes;  Surgeon: [name], MD;  Location: [facility];  Service: Urology;  Laterality: N/A;   JOINT REPLACEMENT Right    KNEE ARTHROSCOPY Right    LUMBAR LAMINECTOMY  02/08/2013   PROSTATECTOMY     radical   RIGHT/LEFT HEART CATH AND CORONARY ANGIOGRAPHY N/A 05/08/2020   Procedure: RIGHT/LEFT HEART CATH AND CORONARY ANGIOGRAPHY;  Surgeon: [name], MD;  Location: [facility];  Service: Cardiovascular;  Laterality: N/A;   TEE WITHOUT CARDIOVERSION N/A 08/12/2020   Procedure: TRANSESOPHAGEAL ECHOCARDIOGRAM (TEE);  Surgeon: [name], MD;  Location: [facility];  Service: Open Heart Surgery;  Laterality: N/A;   TRANSCATHETER AORTIC VALVE REPLACEMENT, TRANSFEMORAL N/A 08/12/2020   Procedure: TRANSCATHETER AORTIC VALVE REPLACEMENT, TRANSFEMORAL;  Surgeon: [name], MD;  Location: [facility];  Service: Open Heart Surgery;  Laterality: N/A;   VASECTOMY  1985   Family History  Problem Relation Age of  Onset   Heart disease Mother    Heart failure Mother    Heart disease Father    Diabetes Father    Social History   Socioeconomic History   Marital status: Married    Spouse name: Not on file   Number of children: 2   Years of education: 16   Highest education level: Bachelor's degree (e.g., BA, AB, BS)  Occupational History   Occupation: Retired  Tobacco Use   Smoking status: Former    Packs/day: 1.50    Years: 20.00    Pack years: 30.00    Types: Cigarettes    Quit date: 02/09/1980    Years since quitting: 40.8   Smokeless tobacco: Never  Vaping Use   Vaping Use: Never used  Substance and Sexual Activity  Alcohol use: No   Drug use: No   Sexual activity: Not Currently  Other Topics Concern   Not on file  Social History Narrative   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    Lives with wife,    two children in [place].    Enjoys reading, boating, travelling to [place] in the winter with wife.   Social Determinants of Health   Financial Resource Strain: Low Risk    Difficulty of Paying Living Expenses: Not hard at all  Food Insecurity: No Food Insecurity   Worried About Charity fundraiser in the Last Year: Never true   [place] in the Last Year: Never true  Transportation Needs: No Transportation Needs   Lack of Transportation (Medical): No   Lack of Transportation (Non-Medical): No  Physical Activity: Sufficiently Active   Days of Exercise per Week: 3 days   Minutes of Exercise per Session: 60 min  Stress: No Stress Concern Present   Feeling of Stress : Not at all  Social Connections: Socially Isolated   Frequency of Communication with Friends and Family: Once a week   Frequency of Social Gatherings with Friends and Family: Once a week   Attends Religious Services: Never   Marine scientist or Organizations: No   Attends Music therapist: Never   Marital Status: Married    Clinical Intake:  Pre-visit preparation completed:  Yes    How often do you need to have someone help you when you read instructions, pamphlets, or other written materials from your doctor or pharmacy?: 1 - Never  Diabetic? Yes  Interpreter Needed?: No  Activities of Daily Living In your present state of health, do you have any difficulty performing the following activities: 12/19/2020 08/12/2020  Hearing? N N  Vision? N N  Comment Wears glasses. Followed by Dr. Gershon Crane -  Difficulty concentrating or making decisions? N N  Walking or climbing stairs? N N  Dressing or bathing? N N  Doing errands, shopping? N N  Preparing Food and eating ? N -  Using the Toilet? N -  Do you have problems with loss of bowel control? N -  Managing your Medications? N -  Managing your Finances? N -  Housekeeping or managing your Housekeeping? N -  Some recent data might be hidden    Patient Care Team: [name], [name], MD as PCP - General (Family Medicine) Debara Pickett Nadean Corwin, MD as PCP - Cardiology (Cardiology) [name], MD as Consulting Physician (Endocrinology) Rutherford Guys, MD as Consulting Physician (Ophthalmology) Germaine Pomfret, [facility] as Pharmacist (Pharmacist)  Indicate any recent Medical Services you may have received from other than [facility] providers in the past year (date may be approximate).     Assessment:   This is a routine wellness examination for [place].  Nutrition Risk Assessment:  Has the patient had any N/V/D within the last 2 months?  No  Does the patient have any non-healing wounds?  No   Has the patient had any unintentional weight loss or weight gain?  No   Diabetes:  Is the patient diabetic?  Yes  If diabetic, was a CBG obtained today?  No  Did the patient bring in their glucometer from home?  No  How often do you monitor your CBG's? Patient states twice a week..   Financial Strains and Diabetes Management:  Are you having any financial strains with the device, your supplies or your medication? No .  Does  the patient want to be seen by Chronic Care Management for management of their diabetes?  No  Would the patient like to be referred to a Nutritionist or for Diabetic Management?  No   Diabetic Exams:  Diabetic Eye Exam: Completed Yes. Followed by Followed by Dr Gershon Crane.  Diabetic Foot Exam: Completed Yes. Followed by Dr Minette Brine.  Hearing/Vision screen Hearing Screening - Comments:: No difficulty hearing Vision Screening - Comments:: Wears eye glasses. Followed Dr Gershon Crane  Dietary issues and exercise activities discussed: Current Exercise Habits: Home exercise routine;Structured exercise class, Type of exercise: stretching;walking;strength training/weights, Time (Minutes): 60, Frequency (Times/Week): 3, Weekly Exercise (Minutes/Week): 180, Intensity: Moderate  Diet: Regular.   Goals Addressed             This Visit's Progress    Exercise 150 min/wk Moderate Activity       Will try to walk more;  Try to walk 1 mile  per day your neighborhood.         Depression Screen PHQ 2/9 Scores 12/19/2020 10/28/2020 05/19/2020 03/13/2019 02/20/2018 02/15/2017 02/15/2017  PHQ - 2 Score 0 0 0 0 0 0 0    Fall Risk Fall Risk  12/19/2020 10/28/2020 03/13/2019 02/20/2018 02/15/2017  Falls in the past year? 0 0 0 0 No  Number falls in past yr: 0 0 - 0 -  Injury with Fall? 0 0 - - -  Risk for fall due to : - Impaired balance/gait;Impaired mobility Medication side effect Impaired vision;Medication side effect -  Follow up - Falls evaluation completed Falls evaluation completed;Education provided;Falls prevention discussed Falls prevention discussed;Education provided -    FALL RISK PREVENTION PERTAINING TO THE HOME:  Any stairs in or around the home? No  If so, are there any without handrails? No  Home free of loose throw rugs in walkways, pet beds, electrical cords, etc? Yes  Adequate lighting in your home to reduce risk of falls? Yes   ASSISTIVE DEVICES UTILIZED TO PREVENT FALLS:  Life alert?  No  Use of a cane, walker or w/c? No  Grab bars in the bathroom? No  Shower chair or bench in shower? No  Elevated toilet seat or a handicapped toilet? Yes   TIMED UP AND GO:  Was the test performed? Yes .  Length of time to ambulate 10 feet: 5 sec.   Gait steady and fast without use of assistive device  Cognitive Function: MMSE - Mini Mental State Exam 02/20/2018 02/15/2017  Not completed: - (No Data)  Orientation to time 5 -  Orientation to Place 5 -  Registration 3 -  Attention/ Calculation 5 -  Recall 3 -  Language- name 2 objects 2 -  Language- repeat 1 -  Language- follow 3 step command 3 -  Language- read & follow direction 1 -  Write a sentence 1 -  Copy design 1 -  Total score 30 -     6CIT Screen 12/19/2020 03/13/2019 02/15/2017  What Year? 0 points 0 points 0 points  What month? 0 points 0 points 0 points  What time? 0 points 0 points 0 points  Count back from 20 0 points 0 points 0 points  Months in reverse 0 points 0 points 0 points  Repeat phrase 0 points 0 points 0 points  Total Score 0 0 0    Immunizations Immunization History  Administered Date(s) Administered   Influenza, High Dose Seasonal PF 12/03/2016, 11/03/2018, 09/08/2020   Influenza-Unspecified 01/08/2017   PFIZER(Purple Top)SARS-COV-2 Vaccination  03/12/2019, 04/02/2019, 11/07/2019, 11/09/2020   Pneumococcal Polysaccharide-23 01/28/2017   Zoster Recombinat (Shingrix) 09/14/2018, 11/17/2018    TDAP status: Due, Education has been provided regarding the importance of this vaccine. Advised may receive this vaccine at local pharmacy or Health Dept. Aware to provide a copy of the vaccination record if obtained from local pharmacy or Health Dept. Verbalized acceptance and understanding.  Flu Vaccine status: Up to date  Pneumococcal vaccine status: Up to date  Covid-19 vaccine status: Completed vaccines  Qualifies for Shingles Vaccine? Yes   Zostavax completed Yes   Shingrix Completed?:  Yes  Screening Tests Health Maintenance  Topic Date Due   COLONOSCOPY (Pts 45-34yrs Insurance coverage will need to be confirmed)  12/19/2021 (Originally 03/31/2020)   TETANUS/TDAP  12/19/2021 (Originally 03/10/1964)   COVID-19 Vaccine (5 - Booster for Pfizer series) 01/04/2021   HEMOGLOBIN A1C  02/08/2021   OPHTHALMOLOGY EXAM  05/09/2021   FOOT EXAM  06/08/2021   Pneumonia Vaccine 58+ Years old  Completed   INFLUENZA VACCINE  Completed   Hepatitis C Screening  Completed   Zoster Vaccines- Shingrix  Completed   HPV VACCINES  Aged Out    Health Maintenance  There are no preventive care reminders to display for this patient.   Vision Screening: Recommended annual ophthalmology exams for early detection of glaucoma and other disorders of the eye. Is the patient up to date with their annual eye exam?  Yes  Who is the provider or what is the name of the office in which the patient attends annual eye exams? Followed by Dr Gershon Crane. Dental Screening: Recommended annual dental exams for proper oral hygiene  Community Resource Referral / Chronic Care Management:  CRR required this visit?  No   CCM required this visit?  No      Plan:     I have personally reviewed and noted the following in the patient's chart:   Medical and social history Use of alcohol, tobacco or illicit drugs  Current medications and supplements including opioid prescriptions. Patient is not currently taking opioid prescriptions. Functional ability and status Nutritional status Physical activity Advanced directives List of other physicians Hospitalizations, surgeries, and ER visits in previous 12 months Vitals Screenings to include cognitive, depression, and falls Referrals and appointments  In addition, I have reviewed and discussed with patient certain preventive protocols, quality metrics, and best practice recommendations. A written personalized care plan for preventive services as well as general  preventive health recommendations were provided to patient.     Criselda Peaches, LPN   15/17/6160

## 2020-12-22 ENCOUNTER — Encounter (HOSPITAL_COMMUNITY): Payer: Medicare Other

## 2020-12-22 ENCOUNTER — Encounter (HOSPITAL_COMMUNITY)
Admission: RE | Admit: 2020-12-22 | Discharge: 2020-12-22 | Disposition: A | Discharge status: Home / Self Care | Payer: Medicare Other | Source: Ambulatory Visit | Attending: Internal Medicine | Admitting: Internal Medicine

## 2020-12-22 ENCOUNTER — Other Ambulatory Visit: Payer: Self-pay

## 2020-12-22 VITALS — Ht 64.75 in | Wt 165.6 lb

## 2020-12-22 DIAGNOSIS — Z952 Presence of prosthetic heart valve: Secondary | ICD-10-CM

## 2020-12-23 DIAGNOSIS — E538 Deficiency of other specified B group vitamins: Secondary | ICD-10-CM | POA: Diagnosis not present

## 2020-12-24 ENCOUNTER — Encounter (HOSPITAL_COMMUNITY): Payer: Medicare Other

## 2020-12-24 ENCOUNTER — Other Ambulatory Visit: Payer: Self-pay

## 2020-12-24 ENCOUNTER — Encounter (HOSPITAL_COMMUNITY)
Admission: RE | Admit: 2020-12-24 | Discharge: 2020-12-24 | Disposition: A | Discharge status: Home / Self Care | Payer: Medicare Other | Source: Ambulatory Visit | Attending: Internal Medicine | Admitting: Internal Medicine

## 2020-12-24 DIAGNOSIS — Z952 Presence of prosthetic heart valve: Secondary | ICD-10-CM

## 2020-12-26 ENCOUNTER — Encounter (HOSPITAL_COMMUNITY)
Admission: RE | Admit: 2020-12-26 | Discharge: 2020-12-26 | Disposition: A | Discharge status: Home / Self Care | Payer: Medicare Other | Source: Ambulatory Visit | Attending: Internal Medicine | Admitting: Internal Medicine

## 2020-12-26 ENCOUNTER — Encounter (HOSPITAL_COMMUNITY): Payer: Medicare Other

## 2020-12-26 ENCOUNTER — Other Ambulatory Visit: Payer: Self-pay

## 2020-12-26 DIAGNOSIS — Z952 Presence of prosthetic heart valve: Secondary | ICD-10-CM

## 2020-12-26 NOTE — Progress Notes (Signed)
Discharge Progress Report  Patient Details  Name: Daryl Webb MRN: [id number] Date of Birth: 03-30-1945 Referring Provider:   Flowsheet Row CARDIAC REHAB PHASE II ORIENTATION from 10/28/2020 in [place]  Referring Provider Lyman Bishop, MD        Number of Visits: 24  Reason for Discharge:  Patient reached a stable level of exercise. Patient independent in their exercise. Patient has met program and personal goals.  Smoking History:  Social History   Tobacco Use  Smoking Status Former   Packs/day: 1.50   Years: 20.00   Pack years: 30.00   Types: Cigarettes   Quit date: 02/09/1980   Years since quitting: 40.9  Smokeless Tobacco Never    Diagnosis:  08/12/20 S/P TAVR (transcatheter aortic valve replacement)  ADL [facility]:   Initial Exercise Prescription:  Initial Exercise Prescription - 10/28/20 1000       Date of Initial Exercise RX and Referring Provider   Date 10/28/20    Referring Provider Lyman Bishop, MD    Expected Discharge Date 12/26/20      NuStep   Level 2    SPM 75    Minutes 25    METs 2      Prescription Details   Frequency (times per week) 3    Duration Progress to 30 minutes of continuous aerobic without signs/symptoms of physical distress      Intensity   THRR 40-80% of Max Heartrate 58-116    Ratings of Perceived Exertion 11-13    Perceived Dyspnea 0-4      Progression   Progression Continue progressive overload as per policy without signs/symptoms or physical distress.      Resistance Training   Training Prescription Yes    Weight 75.7 kg    Reps 10-15             Discharge Exercise Prescription (Final Exercise Prescription Changes):  Exercise Prescription Changes - 12/12/20 1633       Response to Exercise   Blood Pressure (Admit) 124/72    Blood Pressure (Exercise) 142/70    Blood Pressure (Exit) 110/78    Heart Rate (Admit) 74 bpm    Heart Rate (Exercise) 86 bpm    Heart Rate  (Exit) 81 bpm    Rating of Perceived Exertion (Exercise) 9    Perceived Dyspnea (Exercise) 0    Symptoms None    Comments Reviewed MET's and goals    Duration Progress to 30 minutes of  aerobic without signs/symptoms of physical distress    Intensity THRR unchanged      Progression   Progression Continue to progress workloads to maintain intensity without signs/symptoms of physical distress.    Average METs 2.3      Resistance Training   Training Prescription Yes    Weight 3 lbs    Reps 10-15    Time 10 Minutes      Interval Training   Interval Training No      NuStep   Level 2    SPM 85    Minutes 25    METs 2.3      Home Exercise Plan   Plans to continue exercise at Home (comment)    Frequency Add 2 additional days to program exercise sessions.    Initial Home Exercises Provided 11/14/20             Functional Capacity:  6 Minute Walk     Row Name 10/28/20 1000 12/22/20 1619  6 Minute Walk   Phase Initial Discharge    Distance 948 feet 1165 feet    Distance % Change -- 22.89 %    Distance Feet Change -- 217 ft    Walk Time 6 minutes 6 minutes    # of Rest Breaks 0 0    MPH 1.8 2.21    METS 1.87 2.97    RPE 13 9    Perceived Dyspnea  2 0    VO2 Peak 6.53 10.39    Symptoms Yes (comment) No    Comments SOB, RPD = 2, left hip/buttock pain 3/10 --    Resting HR 74 bpm 92 bpm    Resting BP 118/74 126/62    Resting Oxygen Saturation  97 % 96 %    Exercise Oxygen Saturation  during 6 min walk 96 % 97 %    Max Ex. HR 91 bpm 98 bpm    Max Ex. BP 132/70 122/62    2 Minute Post BP 114/66 124/62             Psychological, QOL, Others - Outcomes: PHQ 2/9: Depression screen [facility] 2/9 12/19/2020 10/28/2020 05/19/2020 03/13/2019 02/20/2018  Decreased Interest 0 0 0 0 0  Down, Depressed, Hopeless 0 0 0 0 0  PHQ - 2 Score 0 0 0 0 0    Quality of Life:  Quality of Life - 12/26/20 1504       Quality of Life   Select Quality of Life      Quality of  Life Scores   Health/Function Pre 19.83 %    Health/Function Post 22.5 %    Health/Function % Change 13.46 %    Socioeconomic Pre 20.29 %    Socioeconomic Post 22.5 %    Socioeconomic % Change  10.89 %    Psych/Spiritual Pre 22.5 %    Psych/Spiritual Post 22.5 %    Psych/Spiritual % Change 0 %    Family Pre 21.1 %    Family Post 22.5 %    Family % Change 6.64 %    GLOBAL Pre 20.66 %    GLOBAL Post 22.5 %    GLOBAL % Change 8.91 %             Personal Goals: Goals established at orientation with interventions provided to work toward goal.  Personal Goals and Risk Factors at Admission - 10/28/20 1037       Core Components/Risk Factors/Patient Goals on Admission    Weight Management Yes;Obesity;Weight Loss    Intervention Weight Management: Develop a combined nutrition and exercise program designed to reach desired caloric intake, while maintaining appropriate intake of nutrient and fiber, sodium and fats, and appropriate energy expenditure required for the weight goal.;Weight Management: Provide education and appropriate resources to help participant work on and attain dietary goals.;Weight Management/Obesity: Establish reasonable short term and long term weight goals.;Obesity: Provide education and appropriate resources to help participant work on and attain dietary goals.    Admit Weight 166 lb 14.2 oz (75.7 kg)    Expected Outcomes Short Term: Continue to assess and modify interventions until short term weight is achieved;Long Term: Adherence to nutrition and physical activity/exercise program aimed toward attainment of established weight goal;Weight Maintenance: Understanding of the daily nutrition guidelines, which includes 25-35% calories from fat, 7% or less cal from saturated fats, less than 233m cholesterol, less than 1.5gm of sodium, & 5 or more servings of fruits and vegetables daily;Weight Loss: Understanding of general recommendations for  a balanced deficit meal plan,  which promotes 1-2 lb weight loss per week and includes a negative energy balance of [phone] kcal/d;Understanding recommendations for meals to include 15-35% energy as protein, 25-35% energy from fat, 35-60% energy from carbohydrates, less than 260m of dietary cholesterol, 20-35 gm of total fiber daily;Understanding of distribution of calorie intake throughout the day with the consumption of 4-5 meals/snacks    Diabetes Yes    Intervention Provide education about signs/symptoms and action to take for hypo/hyperglycemia.;Provide education about proper nutrition, including hydration, and aerobic/resistive exercise prescription along with prescribed medications to achieve blood glucose in normal ranges: Fasting glucose 65-99 mg/dL    Expected Outcomes Short Term: Participant verbalizes understanding of the signs/symptoms and immediate care of hyper/hypoglycemia, proper foot care and importance of medication, aerobic/resistive exercise and nutrition plan for blood glucose control.;Long Term: Attainment of HbA1C < 7%.    Hypertension Yes    Intervention Provide education on lifestyle modifcations including regular physical activity/exercise, weight management, moderate sodium restriction and increased consumption of fresh fruit, vegetables, and low fat dairy, alcohol moderation, and smoking cessation.;Monitor prescription use compliance.    Expected Outcomes Short Term: Continued assessment and intervention until BP is < 140/936mHG in hypertensive participants. < 130/8093mG in hypertensive participants with diabetes, heart failure or chronic kidney disease.;Long Term: Maintenance of blood pressure at goal levels.    Lipids Yes    Intervention Provide education and support for participant on nutrition & aerobic/resistive exercise along with prescribed medications to achieve LDL <76m40mDL >40mg50m Expected Outcomes Short Term: Participant states understanding of desired cholesterol values and is compliant  with medications prescribed. Participant is following exercise prescription and nutrition guidelines.;Long Term: Cholesterol controlled with medications as prescribed, with individualized exercise RX and with personalized nutrition plan. Value goals: LDL < 76mg,81m > 40 mg.              Personal Goals Discharge:  Goals and Risk Factor Review     Row Name 11/04/20 1437 11/14/20 1807 12/09/20 1354         Core Components/Risk Factors/Patient Goals Review   Personal Goals Review Weight Management/Obesity;Hypertension;Diabetes;Lipids Weight Management/Obesity;Hypertension;Diabetes;Lipids Weight Management/Obesity;Hypertension;Diabetes;Lipids     Review Bill started exercise at cardiac rehab on 11/03/20 and did well with exercise. Vital signs and CBG's were stable. Bill hRush Landmarkeen doing well with exercise. Vital sings have been stable. CBG's have been variable. Will continue to monitor. Bill cRush Landmarknues to do well with exercise. Vital sings have been stable. CBG's have recently been below 200. Weight has been stable.     Expected Outcomes Bill will continue to partcipate in phase 2 cardiac rehab for exercise, nutrition and life syle modificaytions. Bill wRush Landmarkcontinue to partcipate in phase 2 cardiac rehab for exercise, nutrition and life syle modificaytions. Bill wRush Landmarkcontinue to partcipate in phase 2 cardiac rehab for exercise, nutrition and life syle modificaytions. Bill wRush Landmarkcomplete phase 2 cardiac rehab on 12/26/20.              Exercise Goals and Review:  Exercise Goals     Row Name 10/28/20 1047             Exercise Goals   Increase Physical Activity Yes       Intervention Provide advice, education, support and counseling about physical activity/exercise needs.;Develop an individualized exercise prescription for aerobic and resistive training based on initial evaluation findings, risk stratification, comorbidities and participant's personal goals.  Expected Outcomes Long  Term: Exercising regularly at least 3-5 days a week.;Short Term: Attend rehab on a regular basis to increase amount of physical activity.;Long Term: Add in home exercise to make exercise part of routine and to increase amount of physical activity.       Increase Strength and Stamina Yes       Intervention Provide advice, education, support and counseling about physical activity/exercise needs.;Develop an individualized exercise prescription for aerobic and resistive training based on initial evaluation findings, risk stratification, comorbidities and participant's personal goals.       Expected Outcomes Short Term: Increase workloads from initial exercise prescription for resistance, speed, and METs.;Short Term: Perform resistance training exercises routinely during rehab and add in resistance training at home;Long Term: Improve cardiorespiratory fitness, muscular endurance and strength as measured by increased METs and functional capacity (6MWT)       Able to understand and use rate of perceived exertion (RPE) scale Yes       Intervention Provide education and explanation on how to use RPE scale       Expected Outcomes Short Term: Able to use RPE daily in rehab to express subjective intensity level;Long Term:  Able to use RPE to guide intensity level when exercising independently       Knowledge and understanding of Target Heart Rate Range (THRR) Yes       Intervention Provide education and explanation of THRR including how the numbers were predicted and where they are located for reference       Expected Outcomes Short Term: Able to state/look up THRR;Short Term: Able to use daily as guideline for intensity in rehab;Long Term: Able to use THRR to govern intensity when exercising independently       Understanding of Exercise Prescription Yes       Intervention Provide education, explanation, and written materials on patient's individual exercise prescription       Expected Outcomes Short Term: Able to  explain program exercise prescription;Long Term: Able to explain home exercise prescription to exercise independently                Exercise Goals Re-Evaluation:  Exercise Goals Re-Evaluation     [place] Name 11/03/20 1357 11/14/20 1622 12/01/20 1620 12/12/20 1635       Exercise Goal Re-Evaluation   Exercise Goals Review Increase Physical Activity;Able to understand and use rate of perceived exertion (RPE) scale Increase Physical Activity;Increase Strength and Stamina;Able to understand and use rate of perceived exertion (RPE) scale;Knowledge and understanding of Target Heart Rate Range (THRR);Understanding of Exercise Prescription Increase Physical Activity;Increase Strength and Stamina;Able to understand and use rate of perceived exertion (RPE) scale;Knowledge and understanding of Target Heart Rate Range (THRR);Understanding of Exercise Prescription Increase Physical Activity;Increase Strength and Stamina;Able to understand and use rate of perceived exertion (RPE) scale;Knowledge and understanding of Target Heart Rate Range (THRR);Understanding of Exercise Prescription    Comments Patient able to understand and use RPE scale appropriately. Reviewed MET's, goals and home Ex Rx with pt today. Pt is tolerating exercise well with an average MET level of 2.1. Pt feels like he is off to a good start with his goals of increasing strength and stamina. Pt will add in 1-2 days of walking for 30 minutes a session at home to help him reach his goals. Reviewed MET's.  Pt is tolerating exercise well with an average MET level of 2.0. Reviewed MET's and goals with pt today.  Pt is tolerating exercise well with an average  MET level of 2.3. Pt states he feels a great improvement in his goals of gaining strength and flexibility. Pt is happy with the progress he has made and says he has more stamina to do ADL's and yardwork. Pt also says his ROM in his shoulder has shown improvements with flexibilty training.     Expected Outcomes Progress workloads as tolerated to help increase cardiorespiratory fitness. Will continue to monitor pt and progress workloads as tolerated without sign or symptom. Will continue to monitor pt and progress workloads as tolerated without sign or symptom. Will continue to monitor pt and progress workloads as tolerated without sign or symptom.             Nutrition & Weight - Outcomes:  Pre Biometrics - 10/28/20 0930       Pre Biometrics   Waist Circumference 39 inches    Hip Circumference 39 inches    Waist to Hip Ratio 1 %    Triceps Skinfold 12 mm    % Body Fat 29.1 %    Grip Strength 22 kg    Flexibility 0 in   Could not reach   Single Leg Stand 14.18 seconds             Post Biometrics - 12/22/20 1628        Post  Biometrics   Height 5' 4.75" (1.645 m)    Weight 75.1 kg    Waist Circumference 39.25 inches    Hip Circumference 39.5 inches    Waist to Hip Ratio 0.99 %    BMI (Calculated) 27.75    Triceps Skinfold 14 mm    % Body Fat 27.6 %    Grip Strength 36 kg    Flexibility 7 in    Single Leg Stand 30 seconds             Nutrition:   Nutrition Discharge:   Education Questionnaire Score:  Knowledge Questionnaire Score - 12/26/20 1455       Knowledge Questionnaire Score   Post Score 23/24             Goals reviewed with patient; copy given to patient.Pt graduated from cardiac rehab program on 12/26/20 with completion of  exercise sessions in Phase II. Pt maintained good attendance and progressed nicely during his participation in rehab as evidenced by increased MET level.   Medication list reconciled. Repeat  PHQ score- 0 .  Pt has made significant lifestyle changes and should be commended for his success. Pt feels he has achieved his goals during cardiac rehab.   Pt plans to continue exercise at the downtown [facility]. Rush Landmark is going to look at the [place] reports feeling stronger and increased hs distance on his  post exercise walk test by 217 feet. Barnet Pall, RN,BSN 12/26/2020 3:17 PM

## 2021-01-05 ENCOUNTER — Other Ambulatory Visit: Payer: Self-pay | Admitting: Family Medicine

## 2021-01-05 DIAGNOSIS — Z7984 Long term (current) use of oral hypoglycemic drugs: Secondary | ICD-10-CM | POA: Diagnosis not present

## 2021-01-05 DIAGNOSIS — H25813 Combined forms of age-related cataract, bilateral: Secondary | ICD-10-CM | POA: Diagnosis not present

## 2021-01-05 DIAGNOSIS — M159 Polyosteoarthritis, unspecified: Secondary | ICD-10-CM

## 2021-01-05 DIAGNOSIS — E1136 Type 2 diabetes mellitus with diabetic cataract: Secondary | ICD-10-CM | POA: Diagnosis not present

## 2021-01-26 DIAGNOSIS — E538 Deficiency of other specified B group vitamins: Secondary | ICD-10-CM | POA: Diagnosis not present

## 2021-01-29 DIAGNOSIS — E1122 Type 2 diabetes mellitus with diabetic chronic kidney disease: Secondary | ICD-10-CM | POA: Diagnosis not present

## 2021-01-29 DIAGNOSIS — N1832 Chronic kidney disease, stage 3b: Secondary | ICD-10-CM | POA: Diagnosis not present

## 2021-01-29 DIAGNOSIS — Z7984 Long term (current) use of oral hypoglycemic drugs: Secondary | ICD-10-CM | POA: Diagnosis not present

## 2021-01-29 LAB — HEMOGLOBIN A1C: Hemoglobin A1C: 7.6

## 2021-02-12 ENCOUNTER — Other Ambulatory Visit: Payer: Self-pay | Admitting: Internal Medicine

## 2021-02-13 ENCOUNTER — Other Ambulatory Visit: Payer: Self-pay | Admitting: Internal Medicine

## 2021-02-26 DIAGNOSIS — E538 Deficiency of other specified B group vitamins: Secondary | ICD-10-CM | POA: Diagnosis not present

## 2021-03-10 ENCOUNTER — Encounter: Payer: Self-pay | Admitting: Student

## 2021-03-10 DIAGNOSIS — L821 Other seborrheic keratosis: Secondary | ICD-10-CM | POA: Diagnosis not present

## 2021-03-10 DIAGNOSIS — Z85828 Personal history of other malignant neoplasm of skin: Secondary | ICD-10-CM | POA: Diagnosis not present

## 2021-03-10 DIAGNOSIS — L720 Epidermal cyst: Secondary | ICD-10-CM | POA: Diagnosis not present

## 2021-03-10 DIAGNOSIS — D0439 Carcinoma in situ of skin of other parts of face: Secondary | ICD-10-CM | POA: Diagnosis not present

## 2021-03-10 DIAGNOSIS — L570 Actinic keratosis: Secondary | ICD-10-CM | POA: Diagnosis not present

## 2021-03-10 DIAGNOSIS — L814 Other melanin hyperpigmentation: Secondary | ICD-10-CM | POA: Diagnosis not present

## 2021-03-10 DIAGNOSIS — D225 Melanocytic nevi of trunk: Secondary | ICD-10-CM | POA: Diagnosis not present

## 2021-03-30 DIAGNOSIS — E538 Deficiency of other specified B group vitamins: Secondary | ICD-10-CM | POA: Diagnosis not present

## 2021-04-01 ENCOUNTER — Ambulatory Visit (INDEPENDENT_AMBULATORY_CARE_PROVIDER_SITE_OTHER): Payer: Medicare Other | Admitting: Internal Medicine

## 2021-04-01 ENCOUNTER — Other Ambulatory Visit: Payer: Self-pay

## 2021-04-01 ENCOUNTER — Encounter: Payer: Self-pay | Admitting: Internal Medicine

## 2021-04-01 VITALS — BP 122/60 | HR 75 | Ht 67.0 in | Wt 169.5 lb

## 2021-04-01 DIAGNOSIS — Z952 Presence of prosthetic heart valve: Secondary | ICD-10-CM

## 2021-04-01 DIAGNOSIS — I10 Essential (primary) hypertension: Secondary | ICD-10-CM | POA: Diagnosis not present

## 2021-04-01 DIAGNOSIS — E782 Mixed hyperlipidemia: Secondary | ICD-10-CM

## 2021-04-01 DIAGNOSIS — E119 Type 2 diabetes mellitus without complications: Secondary | ICD-10-CM

## 2021-04-01 NOTE — Progress Notes (Signed)
OFFICE NOTE  Chief Complaint:  Follow-up TAVR  Primary Care Physician: [name], [name], MD  HPI:  Daryl Webb is a 76 y.o. male followed by Dr. Mare Ferrari in the past for hypertension, dyslipidemia, type 2 diabetes and mild aortic sclerosis. His last echocardiogram was in 2015 which showed normal LV function and mild aortic sclerosis/borderline stenosis. Recent lab work was performed by myself indicating hemoglobin A1c of 6.7, creatinine 1.4 however he had a recent UTI, and cholesterol profile which was favorable with LDL 70 and total cholesterol 123. He is on atorvastatin 40 mg daily. He has no history of known coronary artery disease.  02/12/2016  Mr. States returns today for follow-up. He recently established a new primary care provider with Dr. Betty [name]. She performs some lab work including cholesterol which appears to be well-controlled with total cholesterol 113, LDL 58, triglycerides 130 and HDL of 29, which is increased from 24 in May 2017. He denies any new symptoms of chest pain or worsening shortness of breath. He does have a early peaking systolic murmur suggestive of mild aortic stenosis. This was last seen on echo in 2015. He is also describing pain in the left flank which radiates into the left groin. He was treated for possible UTI/prostatitis and had improvement in some urinary symptoms but has persistent pain. A CT scan was performed recently, which was essentially unremarkable. He reports his pain is worse when rising from either sitting or lying position up to standing and with walking. This could suggest some compression perhaps of the thoracic or lumbar spine or even symptoms suggestive of a possible hernia.   08/18/2016  Mr. Homeyer is seen today in follow-up. Overall he seems to be doing pretty well. He had an echocardiogram in January 2018 which now shows moderate aortic stenosis. This is worsened since his prior study in 2015. He has type 2 diabetes  and A1c is 6.7. Blood pressure is well-controlled today 122/60. Recent lipid profile showed an LDL-C of 58, which is at goal. He's asymptomatic, denies any chest pain or worsening shortness of breath. He does do some walking although says he can exercise more frequently.  02/23/2017  Mr. Pillars was seen today in follow-up.  He had a recent echocardiogram which showed mild worsening of his aortic stenosis.  His mean gradient went from 22-24 mmHg.  This is still moderate left ear.  He has generally good blood pressure control.  Initially blood pressure is elevated today 142/86 however came down to 124/78.  His diabetes has been fairly well controlled.  He is followed by Dr. Buddy Duty for this.  Recent lipid profile showed an LDL-C of 58 on high intensity atorvastatin.  10/10/2018  Mr. Dowler is seen today in follow-up.  Overall he is doing well.  He had a repeat echo in January of this year which showed slight worsening of his aortic stenosis.  His mean aortic gradient is increased from 24 to 30 mmHg putting it in the moderate to severe stenosis area.  He denies any chest pain or worsening shortness of breath.  He has had no syncopal events.  Diabetes is well controlled and he is followed by Dr. Buddy Duty.  His blood pressure is at goal today.  His lipids have been well managed.  His EKG shows sinus rhythm with some PACs today at 87.  04/28/2019  Mr. Masri returns for follow-up.  He recently had a repeat echo which I personally reviewed.  This shows a stable aortic  valve gradient around 43mmHg more consistent with moderate aortic stenosis.  A new finding, however was some inferior hypokinesis with a low normal EF 50 to 55%.  He denies any anginal symptoms.  This is reduced from previous EF of 65%.  EKG today shows sinus rhythm with some PACs however there are inferior and lateral T wave changes possibly suggestive of ischemia.  He says he has been less active over the past year but really denies any anginal  symptoms.  05/02/2020  Mr. Degidio returns today for follow-up of his echo to assess aortic stenosis.  Been just over a year since his last study.  This echo however shows some significant progression of his aortic stenosis.  LVEF has dropped slightly to 50 to 55%.  It is now suggested that he has severe aortic stenosis with a mean gradient of 34 mmHg and valve area of 0.99.  There is grade 2 diastolic dysfunction.  His dimensionless index is 0.26.  I discussed this with him and his wife who was joining [place] via telephone conference during the office visit today.  She notes that he has been more short of breath but has not had recent chest pain.  He seems to downplay it somewhat.  He says he has been less active.  04/01/2021  Mr. Breton returns today for follow-up.  He underwent TAVR in July 2022.  Since then he is done extremely well.  He had his last echo in August 2022 which showed low valve gradient with no perivalvular leak and normal LV function.  Since then he has done well without a worsening shortness of breath or chest pain.  Blood pressure is well controlled today.  Weight is near normal.  EKG is unchanged.  PMHx:  Past Medical History:  Diagnosis Date   Anemia    Arthritis    CKD (chronic kidney disease)    Constipation due to pain medication    needs stool softner while on pain medication   Diabetes mellitus    Duodenal ulcer 08/13/2015   2014  - Dr Kaplan/GI   H/O: osteoarthritis    History of kidney stones    History of prostate cancer    History of urethral stricture    Hypercholesterolemia    Hypertension    Prostate cancer ([place])    prostate   S/P TAVR (transcatheter aortic valve replacement) 08/12/2020   s/p TAVR with a 29 mm Edwards Sapien 3 THV via the TF approach by [name] and [name]   Severe aortic stenosis    Spinal stenosis    Ulcer duodenal hemorrhage     Past Surgical History:  Procedure Laterality Date   ARTHROPLASTY  2004   right knee Dr.  Jeanie Sewer SURGERY     CARDIAC CATHETERIZATION     CARPAL TUNNEL RELEASE     right hand,wrist [name]   CYSTOSCOPY N/A 09/20/2012   Procedure: Consuela Mimes;  Surgeon: [name], MD;  Location: [facility];  Service: Urology;  Laterality: N/A;   JOINT REPLACEMENT Right    KNEE ARTHROSCOPY Right    LUMBAR LAMINECTOMY  02/08/2013   PROSTATECTOMY     radical   RIGHT/LEFT HEART CATH AND CORONARY ANGIOGRAPHY N/A 05/08/2020   Procedure: RIGHT/LEFT HEART CATH AND CORONARY ANGIOGRAPHY;  Surgeon: [name], MD;  Location: [facility];  Service: Cardiovascular;  Laterality: N/A;   TEE WITHOUT CARDIOVERSION N/A 08/12/2020   Procedure: TRANSESOPHAGEAL ECHOCARDIOGRAM (TEE);  Surgeon: [name], MD;  Location: [place] [facility];  Service: Open Heart Surgery;  Laterality: N/A;   TRANSCATHETER AORTIC VALVE REPLACEMENT, TRANSFEMORAL N/A 08/12/2020   Procedure: TRANSCATHETER AORTIC VALVE REPLACEMENT, TRANSFEMORAL;  Surgeon: [name], MD;  Location: [facility];  Service: Open Heart Surgery;  Laterality: N/A;   VASECTOMY  1985    FAMHx:  Family History  Problem Relation Age of Onset   Heart disease Mother    Heart failure Mother    Heart disease Father    Diabetes Father     SOCHx:   reports that he quit smoking about 41 years ago. His smoking use included cigarettes. He has a 30.00 pack-year smoking history. He has never used smokeless tobacco. He reports that he does not drink alcohol and does not use drugs.  ALLERGIES:  No Known Allergies  ROS: Pertinent items noted in HPI and remainder of comprehensive ROS otherwise negative.  HOME MEDS: Current Outpatient Medications  Medication Sig Dispense Refill   ACCU-CHEK SMARTVIEW test strip Use to test blood sugars once daily     amLODipine (NORVASC) 5 MG tablet TAKE 1 TABLET DAILY 90 tablet 1   amoxicillin (AMOXIL) 500 MG capsule Take 2,000 mg by mouth See admin instructions. Take 2000 mg 1 hour prior to  dental procedures  2   Ascorbic Acid (VITAMIN C) 1000 MG tablet Take 1,000 mg by mouth 2 (two) times a week.     aspirin 81 MG tablet Take 81 mg by mouth daily.     aspirin-acetaminophen-caffeine (EXCEDRIN MIGRAINE) 250-250-65 MG tablet Take 2 tablets by mouth every 6 (six) hours as needed for headache.     atorvastatin (LIPITOR) 40 MG tablet TAKE 1 TABLET DAILY 90 tablet 1   BYDUREON BCISE 2 MG/0.85ML AUIJ Inject 2 mg into the skin every Thursday.     calcium carbonate (TUMS - DOSED IN MG ELEMENTAL CALCIUM) 500 MG chewable tablet Chew 2 tablets by mouth daily as needed for indigestion or heartburn.     clotrimazole (LOTRIMIN) 1 % cream Apply 1 application topically 2 (two) times daily as needed (jock itch).     Coenzyme Q10 (COQ10) 100 MG CAPS Take 100 mg by mouth 2 (two) times a week.     CVS CINNAMON PO Take 1 capsule by mouth 2 (two) times a week.     cyanocobalamin (,VITAMIN B-12,) 1000 MCG/ML injection Inject 1,000 mcg into the muscle every 30 (thirty) days.     dapagliflozin propanediol (FARXIGA) 5 MG TABS tablet Take 5 mg by mouth daily.     diclofenac (VOLTAREN) 75 MG EC tablet TAKE 1 TABLET DAILY AS     NEEDED 90 tablet 2   diphenhydrAMINE HCl, Sleep, (ZZZQUIL) 25 MG CAPS Take 25 mg by mouth at bedtime.     losartan-hydrochlorothiazide (HYZAAR) 50-12.5 MG tablet TAKE 1 TABLET DAILY 90 tablet 1   metFORMIN (GLUCOPHAGE) 500 MG tablet Take 500 mg by mouth 2 (two) times daily.     metoprolol succinate (TOPROL-XL) 50 MG 24 hr tablet TAKE 1 TABLET DAILY 90 tablet 2   nystatin ointment (MYCOSTATIN) Apply 1 application topically 3 (three) times daily. (Patient taking differently: Apply 1 application topically 3 (three) times daily as needed (jock itch).) 30 g 0   OLIVE LEAF EXTRACT PO Take 600 mg by mouth 2 (two) times a week.     Omega-3 Fatty Acids (FISH OIL) 1000 MG CAPS Take 1,000 mg by mouth 2 (two) times a week.     QUERCETIN PO Take  500 mg by mouth 2 (two) times a week.      Cholecalciferol 2000 UNITS CAPS Take 2,000 Units by mouth 2 (two) times a week. (Patient not taking: Reported on 04/01/2021)     clopidogrel (PLAVIX) 75 MG tablet Take 1 tablet (75 mg total) by mouth daily with breakfast. (Patient not taking: Reported on 04/01/2021) 90 tablet 1   No current facility-administered medications for this visit.    LABS/IMAGING: No results found for this or any previous visit (from the past 48 hour(s)). No results found.  WEIGHTS: Wt Readings from Last 3 Encounters:  04/01/21 169 lb 8 oz (76.9 kg)  12/22/20 165 lb 9.1 oz (75.1 kg)  12/19/20 165 lb 3.2 oz (74.9 kg)    VITALS: BP 122/60    Pulse 75    Ht 5\' 7"  (1.702 m)    Wt 169 lb 8 oz (76.9 kg)    SpO2 97%    BMI 26.55 kg/m   EXAM: General appearance: alert and no distress Neck: no carotid bruit, no JVD and thyroid not enlarged, symmetric, no tenderness/mass/nodules Lungs: clear to auscultation bilaterally Heart: regular rate and rhythm, S1, S2 normal and systolic murmur: Late systolic 3/6, crescendo at 2nd right intercostal space Abdomen: soft, non-tender; bowel sounds normal; no masses,  no organomegaly Extremities: extremities normal, atraumatic, no cyanosis or edema Pulses: 2+ and symmetric Skin: Skin color, texture, turgor normal. No rashes or lesions Neurologic: Grossly normal Psych: Pleasant  EKG: Normal sinus rhythm at 75, incomplete right bundle branch and left anterior fascicular blocks-personally reviewed  ASSESSMENT: Low normal EF 50 to 55%, inferior hypokinesis, ischemic EKG changes Hypertension-at goal Dyslipidemia-at goal LDL<70 (52) Diabetes type 2-A1c 7.0, followed by Dr. Buddy Duty Severe symptomatic aortic stenosis - mean gradient 34 mmHg, AVA 0.99 cm2 (03/2020) -status post TAVR with 29 mm Edwards SAPIEN 3 valve (08/12/2020) DOE  PLAN: 1.   Mr. Felten continues to do very well after TAVR last summer.  His echo in August 2022 showed no significant perivalvular leak and a low aortic  valve gradient.  We will plan a repeat echo in a year and he can follow-up with me at that time.  Blood pressure is well controlled.  His cholesterol is at goal.  He is blood sugars have been well treated.  I have encouraged more physical activity.  Follow-up with me annually or sooner as necessary  [name], MD, [facility], [place] Director of the Advanced Lipid Disorders &  Cardiovascular Risk Reduction Clinic Diplomate of the American Board of Clinical Lipidology Attending Cardiologist  Direct Dial: [phone]   Fax: [phone]  Website:  [email or web address] 04/01/2021, 3:11 PM

## 2021-04-01 NOTE — Patient Instructions (Addendum)
Medication Instructions:  Your physician recommends that you continue on your current medications as directed. Please refer to the Current Medication list given to you today.  *If you need a refill on your cardiac medications before your next appointment, please call your pharmacy*   Testing/Procedures: Your physician has requested that you have an echocardiogram. Echocardiography is a painless test that uses sound waves to create images of your heart. It provides your doctor with information about the size and shape of your heart and how well your hearts chambers and valves are working. This procedure takes approximately one hour. There are no restrictions for this procedure. -- to be done Feb 2024 -- [address] [employer]   Follow-Up: At [facility], you and your health needs are our priority.  As part of our continuing mission to provide you with exceptional heart care, we have created designated Provider Care Teams.  These Care Teams include your primary Cardiologist (physician) and Advanced Practice Providers (APPs -  Physician Assistants and Nurse Practitioners) who all work together to provide you with the care you need, when you need it.  We recommend signing up for the patient portal called "MyChart".  Sign up information is provided on this After Visit Summary.  MyChart is used to connect with patients for Virtual Visits (Telemedicine).  Patients are able to view lab/test results, encounter notes, upcoming appointments, etc.  Non-urgent messages can be sent to your provider as well.   To learn more about what you can do with MyChart, go to [email or web address].    Your next appointment:    1 year with [name]

## 2021-04-20 ENCOUNTER — Other Ambulatory Visit: Payer: Self-pay | Admitting: Physician Assistant

## 2021-04-20 DIAGNOSIS — Z952 Presence of prosthetic heart valve: Secondary | ICD-10-CM

## 2021-04-21 NOTE — Progress Notes (Signed)
? ? ?ACUTE VISIT ?Chief Complaint  ?Patient presents with  ? head cold  ?  X 4 weeks, head only; no fever.   ? ?HPI: ?Daryl Webb is a 76 y.o. male, who is here today complaining of sinus congestion as described above. ?Greenish rhinorrhea in the morning. ? ?Sinus Problem ?This is a new problem. The current episode started 1 to 4 weeks ago. The problem is unchanged. There has been no fever. His pain is at a severity of 0/10. He is experiencing no pain. Associated symptoms include congestion and sinus pressure. Pertinent negatives include no chills, coughing, diaphoresis, ear pain, headaches, hoarse voice, neck pain, shortness of breath, sneezing, sore throat or swollen glands. Past treatments include oral decongestants. The treatment provided mild relief.  ? ?Headache and pressure in the morning, improved after blowing his nose. ?He has taken alka seltzer plus, tylenol,and a week ago tried mucinex. ? ?He was last seen on 05/19/2020. ?Since his last visit he underwent TAVR procedure,08/12/2020, completed cardiac rehab, and recovered well. ?. ?He has also followed with his endocrinologist for DM2 and vitamin B12 deficiency. ? ?HTN on Losartan-HCTZ 50-12.5 mg daily,Metoprolol succinate 50 mg daily,and Amlodipine 5 mg daily. ?Negative for visual changes, chest pain, dyspnea, palpitation, claudication, focal weakness, or edema. ?CKD III: He has not noted decreased urine output, foam in urine,or gross hematuria. ? ?Lab Results  ?Component Value Date  ? CREATININE 1.52 (H) 08/13/2020  ? BUN 28 (H) 08/13/2020  ? NA 138 08/13/2020  ? K 4.5 08/13/2020  ? CL 105 08/13/2020  ? CO2 26 08/13/2020  ? ?Hyperlipidemia: Currently he is on Atorvastatin 40 mg daily.Marland Kitchen ?Aortic atherosclerosis was seen on imaging in 05/2020. ?He is on Aspirin 81 mg daily. ? ?Lab Results  ?Component Value Date  ? CHOL 94 06/28/2017  ? HDL 27.90 (L) 06/28/2017  ? [place] 43 06/28/2017  ? LDLDIRECT 73.8 11/20/2012  ? TRIG 112.0 06/28/2017  ? CHOLHDL  3 06/28/2017  ? ?He has also followed with his dermatologist, s/p nose bx. Also had back cyst drained a few weeks ago, he would like for me to take a look. It has bleed some intermittently, mainly when in bed, lying on his back. No pain, edema,or erythema. ? ?PHQ positive. He denies depressed mood but frustrated because limitations. He cannot do things he enjoys because joint pain. ?Generalized OA, he is on Diclofenac 75 mg, which he takes daily as needed and still helps with joint. ?He has been on same medication for several years. ?He has tried Tramadol among some and did not help. ?He is taking Diclofenac about 4 times per week. ?He enjoys outdoor work but exacerbate pain. ?No erythema or edema. ?Knees and IP mainly, also shoulders and lower back. ? ?Depression screen [place] 2/9 04/22/2021 12/26/2020 12/19/2020 10/28/2020 05/19/2020  ?Decreased Interest 1 0 0 0 0  ?Down, Depressed, Hopeless 1 0 0 0 0  ?PHQ - 2 Score 2 0 0 0 0  ?Altered sleeping 3 - - - -  ?Tired, decreased energy 1 - - - -  ?Change in appetite 1 - - - -  ?Feeling bad or failure about yourself  1 - - - -  ?Trouble concentrating 1 - - - -  ?Moving slowly or fidgety/restless 0 - - - -  ?Suicidal thoughts 0 - - - -  ?PHQ-9 Score 9 - - - -  ?Difficult doing work/chores Somewhat difficult - - - -  ? ?Insomnia:Problem has been going on for a  few months.  Taking zzquil and sometimes Nyquil. ?Sleeping about 4 hours at the most. ? ?Review of Systems  ?Constitutional:  Positive for fatigue. Negative for activity change, appetite change, chills, diaphoresis and fever.  ?HENT:  Positive for congestion, postnasal drip, rhinorrhea and sinus pressure. Negative for ear pain, hoarse voice, mouth sores, sneezing and sore throat.   ?Respiratory:  Negative for cough, shortness of breath and wheezing.   ?Gastrointestinal:  Negative for abdominal pain, nausea and vomiting.  ?Musculoskeletal:  Positive for arthralgias. Negative for gait problem and neck pain.  ?Neurological:   Negative for syncope, facial asymmetry and headaches.  ?Psychiatric/Behavioral:  Negative for confusion.   ?Rest see pertinent positives and negatives per HPI. ? ?Current Outpatient Medications on File Prior to Visit  ?Medication Sig Dispense Refill  ? ACCU-CHEK SMARTVIEW test strip Use to test blood sugars once daily    ? amLODipine (NORVASC) 5 MG tablet TAKE 1 TABLET DAILY 90 tablet 1  ? amoxicillin (AMOXIL) 500 MG capsule Take 2,000 mg by mouth See admin instructions. Take 2000 mg 1 hour prior to dental procedures  2  ? Ascorbic Acid (VITAMIN C) 1000 MG tablet Take 1,000 mg by mouth 2 (two) times a week.    ? aspirin 81 MG tablet Take 81 mg by mouth daily.    ? aspirin-acetaminophen-caffeine (EXCEDRIN MIGRAINE) 250-250-65 MG tablet Take 2 tablets by mouth every 6 (six) hours as needed for headache.    ? atorvastatin (LIPITOR) 40 MG tablet TAKE 1 TABLET DAILY 90 tablet 1  ? BYDUREON BCISE 2 MG/0.85ML AUIJ Inject 2 mg into the skin every Thursday.    ? calcium carbonate (TUMS - DOSED IN MG ELEMENTAL CALCIUM) 500 MG chewable tablet Chew 2 tablets by mouth daily as needed for indigestion or heartburn.    ? clotrimazole (LOTRIMIN) 1 % cream Apply 1 application topically 2 (two) times daily as needed (jock itch).    ? Coenzyme Q10 (COQ10) 100 MG CAPS Take 100 mg by mouth 2 (two) times a week.    ? CVS CINNAMON PO Take 1 capsule by mouth 2 (two) times a week.    ? cyanocobalamin (,VITAMIN B-12,) 1000 MCG/ML injection Inject 1,000 mcg into the muscle every 30 (thirty) days.    ? dapagliflozin propanediol (FARXIGA) 5 MG TABS tablet Take 5 mg by mouth daily.    ? diclofenac (VOLTAREN) 75 MG EC tablet TAKE 1 TABLET DAILY AS     NEEDED 90 tablet 2  ? diphenhydrAMINE HCl, Sleep, (ZZZQUIL) 25 MG CAPS Take 25 mg by mouth at bedtime.    ? losartan-hydrochlorothiazide (HYZAAR) 50-12.5 MG tablet TAKE 1 TABLET DAILY 90 tablet 1  ? metFORMIN (GLUCOPHAGE) 500 MG tablet Take 500 mg by mouth 2 (two) times daily.    ? metoprolol  succinate (TOPROL-XL) 50 MG 24 hr tablet TAKE 1 TABLET DAILY 90 tablet 2  ? nystatin ointment (MYCOSTATIN) Apply 1 application topically 3 (three) times daily. (Patient taking differently: Apply 1 application. topically 3 (three) times daily as needed (jock itch).) 30 g 0  ? OLIVE LEAF EXTRACT PO Take 600 mg by mouth 2 (two) times a week.    ? Omega-3 Fatty Acids (FISH OIL) 1000 MG CAPS Take 1,000 mg by mouth 2 (two) times a week.    ? QUERCETIN PO Take 500 mg by mouth 2 (two) times a week.    ? Cholecalciferol 2000 UNITS CAPS Take 2,000 Units by mouth 2 (two) times a week. (Patient not taking: Reported  on 04/01/2021)    ? ?No current facility-administered medications on file prior to visit.  ? ?Past Medical History:  ?Diagnosis Date  ? Anemia   ? Arthritis   ? CKD (chronic kidney disease)   ? Constipation due to pain medication   ? needs stool softner while on pain medication  ? Diabetes mellitus   ? Duodenal ulcer 08/13/2015  ? 2014  - Dr Kaplan/GI  ? H/O: osteoarthritis   ? History of kidney stones   ? History of prostate cancer   ? History of urethral stricture   ? Hypercholesterolemia   ? Hypertension   ? Prostate cancer ([place])   ? prostate  ? S/P TAVR (transcatheter aortic valve replacement) 08/12/2020  ? s/p TAVR with a 29 mm Edwards Sapien 3 THV via the TF approach by [name] and [name]  ? Severe aortic stenosis   ? Spinal stenosis   ? Ulcer duodenal hemorrhage   ? ?No Known Allergies ? ?Social History  ? ?Socioeconomic History  ? Marital status: Married  ?  Spouse name: Not on file  ? Number of children: 2  ? Years of education: 83  ? Highest education level: Bachelor's degree (e.g., BA, AB, BS)  ?Occupational History  ? Occupation: Retired  ?Tobacco Use  ? Smoking status: Former  ?  Packs/day: 1.50  ?  Years: 20.00  ?  Pack years: 30.00  ?  Types: Cigarettes  ?  Quit date: 02/09/1980  ?  Years since quitting: 41.2  ? Smokeless tobacco: Never  ?Vaping Use  ? Vaping Use: Never used  ?Substance and Sexual  Activity  ? Alcohol use: No  ? Drug use: No  ? Sexual activity: Not Currently  ?Other Topics Concern  ? Not on file  ?Social History Narrative  ? 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  ?  Lives with w

## 2021-04-22 ENCOUNTER — Encounter: Payer: Self-pay | Admitting: Family Medicine

## 2021-04-22 ENCOUNTER — Ambulatory Visit (INDEPENDENT_AMBULATORY_CARE_PROVIDER_SITE_OTHER): Payer: Medicare Other | Admitting: Family Medicine

## 2021-04-22 VITALS — BP 142/90 | HR 94 | Temp 98.0°F | Resp 16 | Ht 67.0 in | Wt 162.1 lb

## 2021-04-22 DIAGNOSIS — L723 Sebaceous cyst: Secondary | ICD-10-CM | POA: Diagnosis not present

## 2021-04-22 DIAGNOSIS — G4700 Insomnia, unspecified: Secondary | ICD-10-CM | POA: Insufficient documentation

## 2021-04-22 DIAGNOSIS — M159 Polyosteoarthritis, unspecified: Secondary | ICD-10-CM

## 2021-04-22 DIAGNOSIS — I700 Atherosclerosis of aorta: Secondary | ICD-10-CM

## 2021-04-22 DIAGNOSIS — N1832 Chronic kidney disease, stage 3b: Secondary | ICD-10-CM | POA: Diagnosis not present

## 2021-04-22 DIAGNOSIS — I10 Essential (primary) hypertension: Secondary | ICD-10-CM | POA: Diagnosis not present

## 2021-04-22 DIAGNOSIS — R0981 Nasal congestion: Secondary | ICD-10-CM | POA: Diagnosis not present

## 2021-04-22 DIAGNOSIS — E1122 Type 2 diabetes mellitus with diabetic chronic kidney disease: Secondary | ICD-10-CM | POA: Diagnosis not present

## 2021-04-22 DIAGNOSIS — E782 Mixed hyperlipidemia: Secondary | ICD-10-CM

## 2021-04-22 LAB — BASIC METABOLIC PANEL
BUN: 29 mg/dL — ABNORMAL HIGH (ref 6–23)
CO2: 28 mEq/L (ref 19–32)
Calcium: 10 mg/dL (ref 8.4–10.5)
Chloride: 99 mEq/L (ref 96–112)
Creatinine, Ser: 1.6 mg/dL — ABNORMAL HIGH (ref 0.40–1.50)
GFR: 41.71 mL/min — ABNORMAL LOW (ref >60.00)
Glucose, Bld: 183 mg/dL — ABNORMAL HIGH (ref 70–99)
Potassium: 4.5 mEq/L (ref 3.5–5.1)
Sodium: 134 mEq/L — ABNORMAL LOW (ref 135–145)

## 2021-04-22 MED ORDER — FLUTICASONE PROPIONATE 50 MCG/ACT NA SUSP: 1.0000 | Freq: Two times a day (BID) | NASAL | 1 refills | Status: DC

## 2021-04-22 MED ORDER — DOXYCYCLINE HYCLATE 100 MG PO TABS: 100.0000 mg | ORAL_TABLET | Freq: Two times a day (BID) | ORAL | 0 refills | Status: AC

## 2021-04-22 MED ORDER — TRAZODONE HCL 50 MG PO TABS: 25.0000 mg | ORAL_TABLET | Freq: Every evening | ORAL | 1 refills | Status: DC | PRN

## 2021-04-22 NOTE — Assessment & Plan Note (Signed)
Problem has been stable, Cr 1.4-1.5 and e GFR in the low-mid 40's. ?We discussed some side effects of Diclofenac and NSAID's in general.  ?Adequate hydration, glucose,and BP controlled. ?Continue low salt diet. ?

## 2021-04-22 NOTE — Assessment & Plan Note (Addendum)
Problem is otherwise stable. ?Continue Diclofenac 75 mg EC daily as needed.  ?We discussed some NSAID's side effects, at this time benefit seems to be greater than risk. ?He has tried Tramadol and Acetaminophen but did not help with pain. ? ?

## 2021-04-22 NOTE — Assessment & Plan Note (Signed)
Last HgA1C 7.6 in 01/2021. ?Following with endocrinologist. ?

## 2021-04-22 NOTE — Assessment & Plan Note (Addendum)
BP mildly elevated today but last appt with cardiologist normal at 122/60, so continue Losartan-HCTZ 50-12.5 mg and Metoprolol succinate 50 mg daily. ?Monitor BP at home and continue low sal diet. ?

## 2021-04-22 NOTE — Assessment & Plan Note (Addendum)
Adequate sleep hygiene. ?We discussed some pharmacologic options, agrees with taking Trazodone 25-50 mg around bedtime. ?Some side effects discussed. ?Recommend stopping benadryl. ?

## 2021-04-22 NOTE — Patient Instructions (Addendum)
A few things to remember from today's visit: ? ?Generalized osteoarthritis of multiple sites ? ?Atherosclerosis of aorta ([place]), Chronic ? ?Mixed hyperlipidemia ? ?Stage 3b chronic kidney disease ([place]) - Plan: Basic metabolic panel ? ?Sebaceous cyst ? ?Nasal sinus congestion ? ?I do not think you need antibiotics at this time but you can start Doxycycline in 4 days of you do not notice any improvement. ?Flonase nasal spray in each nostril at bedtime. ?Nasal saline irrigations a few times during the day. ? ?No changes today. ? ?Sebaceous cyst has some crusty areas on top that can bleed if removed accidentally, apply pressure for a few minutes. You can have a bandage on at night. ?No signs of infection. ? ?If you need refills please call your pharmacy. ?Do not use My Chart to request refills or for acute issues that need immediate attention. ?  ?Please be sure medication list is accurate. ?If a new problem present, please set up appointment sooner than planned today. ? ? ? ? ? ? ? ?

## 2021-04-22 NOTE — Assessment & Plan Note (Signed)
Seen on imaging. ?Continue Atorvastatin 40 mg daily. ?Currently on Aspirin 81 mg daily. ?

## 2021-04-23 MED ORDER — DICLOFENAC SODIUM 50 MG PO TBEC: 50.0000 mg | DELAYED_RELEASE_TABLET | Freq: Every day | ORAL | 1 refills | Status: DC | PRN

## 2021-04-30 ENCOUNTER — Other Ambulatory Visit: Payer: Self-pay | Admitting: Internal Medicine

## 2021-04-30 DIAGNOSIS — E538 Deficiency of other specified B group vitamins: Secondary | ICD-10-CM | POA: Diagnosis not present

## 2021-05-21 ENCOUNTER — Other Ambulatory Visit: Payer: Self-pay | Admitting: Physician Assistant

## 2021-06-03 DIAGNOSIS — E538 Deficiency of other specified B group vitamins: Secondary | ICD-10-CM | POA: Diagnosis not present

## 2021-06-05 ENCOUNTER — Other Ambulatory Visit: Payer: Self-pay

## 2021-06-05 NOTE — Progress Notes (Signed)
Opened in error

## 2021-06-18 ENCOUNTER — Other Ambulatory Visit: Payer: Self-pay | Admitting: Family Medicine

## 2021-06-18 DIAGNOSIS — G4700 Insomnia, unspecified: Secondary | ICD-10-CM

## 2021-06-25 DIAGNOSIS — Z23 Encounter for immunization: Secondary | ICD-10-CM | POA: Diagnosis not present

## 2021-06-29 NOTE — Progress Notes (Unsigned)
HEART [facility] Daryl Webb                                     Cardiology Office Note:    Date:  07/01/2021   ID:  Daryl Webb, DOB 09-05-1945, MRN [id number]  PCP:  [name], [name], MD  [facility] Cardiologist:  [name], MD / [name] & [name] (TAVR) [facility] Electrophysiologist:  None   Referring MD: [name], [name], MD   1 year s/p TAVR  History of Present Illness:    Daryl Webb is a 76 y.o. male with a hx of HTN, HLD, DMT2, CKD stage III, duodenal ulcer and severe aortic stenosis s/p TAVR (08/13/20) who presents to clinic for follow up.   He has been followed by [name] for moderate aortic stenosis that has progressed over time. Most recent echo 03/26/20 showed a mild reduction in LV function with an EF 50-55%, moderate concentric LVH, G2DD, and severe AS with a mean gradient of 34 mm hg, peak gradient of 64 mm hg, AVA 0.81 cm2, DVI 0.26. He complained of dyspnea on exertion and benodpnea. [name] 05/08/20 showed a widely patent left main, widely patent LAD with ostial stenosis of a small second diagonal branch, otherwise no significant obstruction in the LAD territory. There was a patent left circumflex with mild to moderate nonobstructive plaquing in the mid vessel and patent RCA with mild nonobstructive stenosis, except for a 70% PDA stenosis in a relatively small caliber PDA branch. Medical therapy was recommended. Of note, pre TAVR CT scans showed an indeterminate lesion in the interpolar region of the right kidney which has grown compared to prior study from 2017. Follow up MRI 5/1 showed an exophytic cyst of the lateral midportion of the right kidney measuring 2.1 cm. No suspicious mass or contrast enhancement. No further follow-up or characterization is required for this benign cyst.   He was evaluated by the multidisciplinary valve team and underwent successful TAVR with a 29 mm Edwards Sapien 3 THV via  the TF approach on 08/12/20. Post operative echo showed EF 50%, normally functioning TAVR with a mean gradient of 8.8 mmHg and no PVL. He was continued on home Asprin and Plavix '75mg'$  daily added x 6 months. He was discharged on POD 1 after an uncomplicated hospital course. He has done well in follow up.   Today the patient presents to clinic for follow up. No CP or SOB. No LE edema, orthopnea or PND. No dizziness or syncope. No blood in stool or urine. No palpitations.   Past Medical History:  Diagnosis Date   Anemia    Arthritis    CKD (chronic kidney disease)    Constipation due to pain medication    needs stool softner while on pain medication   Diabetes mellitus    Duodenal ulcer 08/13/2015   2014  - Dr Kaplan/GI   H/O: osteoarthritis    History of kidney stones    History of prostate cancer    History of urethral stricture    Hypercholesterolemia    Hypertension    Prostate cancer ([place])    prostate   S/P TAVR (transcatheter aortic valve replacement) 08/12/2020   s/p TAVR with a 29 mm Edwards Sapien 3 THV via the TF approach by [name] and [name]   Severe aortic stenosis  Spinal stenosis    Ulcer duodenal hemorrhage     Past Surgical History:  Procedure Laterality Date   ARTHROPLASTY  2004   right knee [name] SURGERY     CARDIAC CATHETERIZATION     CARPAL TUNNEL RELEASE     right hand,wrist [name]   CYSTOSCOPY N/A 09/20/2012   Procedure: CYSTOSCOPY;  Surgeon: [name], MD;  Location: [place] [facility]UR[facility] ORS;  Service: Urology;  Laterality: N/A;   JOINT REPLACEMENT Right    KNEE ARTHROSCOPY Right    LUMBAR LAMINECTOMY  02/08/2013   PROSTATECTOMY     radical   RIGHT/LEFT HEART CATH AND CORONARY ANGIOGRAPHY N/A 05/08/2020   Procedure: RIGHT/LEFT HEART CATH AND CORONARY ANGIOGRAPHY;  Surgeon: [name], MD;  Location: [place] CV LAB;  Service: Cardiovascular;  Laterality: N/A;   TEE WITHOUT CARDIOVERSION N/A 08/12/2020   Procedure:  TRANSESOPHAGEAL ECHOCARDIOGRAM (TEE);  Surgeon: [name], MD;  Location: [name];  Service: Open Heart Surgery;  Laterality: N/A;   TRANSCATHETER AORTIC VALVE REPLACEMENT, TRANSFEMORAL N/A 08/12/2020   Procedure: TRANSCATHETER AORTIC VALVE REPLACEMENT, TRANSFEMORAL;  Surgeon: [name], MD;  Location: [place] CV LAB;  Service: Open Heart Surgery;  Laterality: N/A;   VASECTOMY  1985    Current Medications: Current Meds  Medication Sig   ACCU-CHEK SMARTVIEW test strip Use to test blood sugars once daily   amLODipine (NORVASC) 5 MG tablet TAKE 1 TABLET DAILY   Ascorbic Acid (VITAMIN C) 1000 MG tablet Take 1,000 mg by mouth 2 (two) times a week.   aspirin 81 MG tablet Take 81 mg by mouth daily.   aspirin-acetaminophen-caffeine (EXCEDRIN MIGRAINE) 250-250-65 MG tablet Take 2 tablets by mouth every 6 (six) hours as needed for headache.   atorvastatin (LIPITOR) 40 MG tablet TAKE 1 TABLET DAILY   BYDUREON BCISE 2 MG/0.85ML AUIJ Inject 2 mg into the skin every Thursday.   calcium carbonate (TUMS - DOSED IN MG ELEMENTAL CALCIUM) 500 MG chewable tablet Chew 2 tablets by mouth daily as needed for indigestion or heartburn.   Cholecalciferol 2000 UNITS CAPS Take 2,000 Units by mouth 2 (two) times a week.   clotrimazole (LOTRIMIN) 1 % cream Apply 1 application topically 2 (two) times daily as needed (jock itch).   Coenzyme Q10 (COQ10) 100 MG CAPS Take 100 mg by mouth 2 (two) times a week.   [employer] CINNAMON PO Take 1 capsule by mouth 2 (two) times a week.   cyanocobalamin (,VITAMIN B-12,) 1000 MCG/ML injection Inject 1,000 mcg into the muscle every 30 (thirty) days.   dapagliflozin propanediol (FARXIGA) 5 MG TABS tablet Take 5 mg by mouth daily.   diclofenac (VOLTAREN) 50 MG EC tablet Take 1 tablet (50 mg total) by mouth daily as needed.   diphenhydrAMINE HCl, Sleep, (ZZZQUIL) 25 MG CAPS Take 25 mg by mouth at bedtime.   fluticasone (FLONASE) 50 MCG/ACT nasal spray Place 1 spray  into both nostrils 2 (two) times daily for 28 days.   losartan-hydrochlorothiazide (HYZAAR) 50-12.5 MG tablet TAKE 1 TABLET DAILY   metFORMIN (GLUCOPHAGE) 500 MG tablet Take 500 mg by mouth 2 (two) times daily.   metoprolol succinate (TOPROL-XL) 50 MG 24 hr tablet TAKE 1 TABLET DAILY   nystatin ointment (MYCOSTATIN) Apply 1 application topically 3 (three) times daily. (Patient taking differently: Apply 1 application. topically 3 (three) times daily as needed (jock itch).)   OLIVE LEAF EXTRACT PO Take 600 mg by mouth 2 (two) times a week.  Omega-3 Fatty Acids (FISH OIL) 1000 MG CAPS Take 1,000 mg by mouth 2 (two) times a week.   QUERCETIN PO Take 500 mg by mouth 2 (two) times a week.   traZODone (DESYREL) 50 MG tablet TAKE 0.5-1 TABLETS BY MOUTH AT BEDTIME AS NEEDED FOR SLEEP.   [DISCONTINUED] amoxicillin (AMOXIL) 500 MG capsule Take 2,000 mg by mouth See admin instructions. Take 2000 mg 1 hour prior to dental procedures     Allergies:   Patient has no known allergies.   Social History   Socioeconomic History   Marital status: Married    Spouse name: Not on file   Number of children: 2   Years of education: 16   Highest education level: Bachelor's degree (e.g., BA, AB, BS)  Occupational History   Occupation: Retired  Tobacco Use   Smoking status: Former    Packs/day: 1.50    Years: 20.00    Pack years: 30.00    Types: Cigarettes    Quit date: 02/09/1980    Years since quitting: 41.4   Smokeless tobacco: Never  Vaping Use   Vaping Use: Never used  Substance and Sexual Activity   Alcohol use: No   Drug use: No   Sexual activity: Not Currently  Other Topics Concern   Not on file  Social History Narrative   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    Lives with wife,    two children in [place].    Enjoys reading, boating, travelling to [place] in the winter with wife.   Social Determinants of Health   Financial Resource Strain: Low Risk    Difficulty of Paying Living  Expenses: Not hard at all  Food Insecurity: No Food Insecurity   Worried About Charity fundraiser in the Last Year: Never true   [place] in the Last Year: Never true  Transportation Needs: No Transportation Needs   Lack of Transportation (Medical): No   Lack of Transportation (Non-Medical): No  Physical Activity: Sufficiently Active   Days of Exercise per Week: 3 days   Minutes of Exercise per Session: 60 min  Stress: No Stress Concern Present   Feeling of Stress : Not at all  Social Connections: Socially Isolated   Frequency of Communication with Friends and Family: Once a week   Frequency of Social Gatherings with Friends and Family: Once a week   Attends Religious Services: Never   Marine scientist or Organizations: No   Attends Music therapist: Never   Marital Status: Married     Family History: The patient's family history includes Diabetes in his father; Heart disease in his father and mother; Heart failure in his mother.  ROS:   Please see the history of present illness.    All other systems reviewed and are negative.  EKGs/Labs/Other Studies Reviewed:    The following studies were reviewed today:  TAVR OPERATIVE NOTE     Date of Procedure:                08/12/2020   Preoperative Diagnosis:      Severe Aortic Stenosis   Postoperative Diagnosis:    Same   Procedure:        Transcatheter Aortic Valve Replacement - Percutaneous  Transfemoral Approach             Edwards Sapien 3 THV (size 29 mm, model # 9600TFX, serial # [id number])  Co-Surgeons:                        Gaye Pollack, MD and [name], MD   Anesthesiologist:                  Dr Ermalene Postin   Echocardiographer:              Dr Sallyanne Kuster   Pre-operative Echo Findings: Severe aortic stenosis Normal left ventricular systolic function   Post-operative Echo Findings: No paravalvular leak Normal/unchanged left ventricular systolic function   _____________     Echo 08/13/2020: IMPRESSIONS  1. Left ventricular ejection fraction, by estimation, is 50 to 55%. The  left ventricle has low normal function. The left ventricle has no regional  wall motion abnormalities. There is mild concentric left ventricular  hypertrophy. Left ventricular  diastolic parameters are consistent with Grade I diastolic dysfunction  (impaired relaxation).   2. Right ventricular systolic function is normal. The right ventricular  size is normal.   3. The mitral valve is normal in structure. No evidence of mitral valve  regurgitation. No evidence of mitral stenosis.   4. The aortic valve is normal in structure. Aortic valve regurgitation is  not visualized. No aortic stenosis is present. There is a 29 mm Sapien  prosthetic (TAVR) valve present in the aortic position. Procedure Date:  08/12/2020. Aortic valve mean gradient measures 8.8 mmHg. Aortic valve Vmax measures 2.10 m/s. Aortic valve acceleration time measures 61 msec.   5. The inferior vena cava is normal in size with greater than 50%  respiratory variability, suggesting right atrial pressure of 3 mmHg.  _____________________  Echo 07/01/21 IMPRESSIONS  1. Left ventricular ejection fraction, by estimation, is 55 to 60%. Left ventricular ejection fraction by 3D volume is 63 %. The left ventricle has normal function. The left ventricle has no regional wall motion abnormalities. There is mild concentric  left ventricular hypertrophy. Left ventricular diastolic parameters are consistent with Grade I diastolic dysfunction (impaired relaxation).  2. Right ventricular systolic function is normal. The right ventricular size is normal. Tricuspid regurgitation signal is inadequate for assessing PA pressure.  3. Left atrial size was moderately dilated.  4. Right atrial size was mildly dilated.  5. The mitral valve is normal in structure. Trivial mitral valve regurgitation.  6. The aortic valve has been repaired/replaced.  Aortic valve regurgitation is not visualized. There is a valve present in the aortic position. Echo findings are consistent with normal structure and function of the aortic valve prosthesis. Aortic valve  mean gradient measures 9.2 mmHg. Aortic valve Vmax measures 2.07 m/s. Aortic valve acceleration time measures 66 msec.  EKG:  EKG is NOT ordered today.  Recent Labs: 08/08/2020: ALT 24 08/13/2020: Hemoglobin 13.2; Magnesium 2.2; Platelets 156 04/22/2021: BUN 29; Creatinine, Ser 1.60; Potassium 4.5; Sodium 134  Recent Lipid Panel    Component Value Date/Time   CHOL 94 06/28/2017 0915   TRIG 112.0 06/28/2017 0915   HDL 27.90 (L) 06/28/2017 0915   CHOLHDL 3 06/28/2017 0915   VLDL 22.4 06/28/2017 0915   LDLCALC 43 06/28/2017 0915   LDLDIRECT 73.8 11/20/2012 0840     Risk Assessment/Calculations:       Physical Exam:    VS:  BP 132/74   Pulse 75   Ht '5\' 7"'$  (1.702 m)   Wt 158 lb (71.7 kg)   SpO2 97%   BMI 24.75 kg/m     Wt Readings from Last  3 Encounters:  07/01/21 158 lb (71.7 kg)  04/22/21 162 lb 2 oz (73.5 kg)  04/01/21 169 lb 8 oz (76.9 kg)     GEN:  Well nourished, well developed in no acute distress HEENT: Normal NECK: No JVD; No carotid bruits LYMPHATICS: No lymphadenopathy CARDIAC: RRR, no murmurs, rubs, gallops RESPIRATORY:  Clear to auscultation without rales, wheezing or rhonchi  ABDOMEN: Soft, non-tender, non-distended MUSCULOSKELETAL:  No edema; No deformity  SKIN: Warm and dry NEUROLOGIC:  Alert and oriented x 3 PSYCHIATRIC:  Normal affect   ASSESSMENT:    1. S/P TAVR (transcatheter aortic valve replacement)   2. Essential hypertension   3. Stage 3b chronic kidney disease ([place])   4. Coronary artery disease involving native heart without angina pectoris, unspecified vessel or lesion type      PLAN:    In order of problems listed above:  Severe AS s/p TAVR: echo today shows EF 55%, normally functioning TAVR with a mean gradient of 9.2 mm hg and no  PVL. He has NYHA class I symptoms. SBE prophylaxis discussed; he has amoxicillin.  Continue on aspirin alone. Continue regular follow up with [name].  HTN: Bp well controlled. No changes made today   CKD stage III: Creatinine has remained stable around 1.6.   CAD: pre TAVR cath showed a widely patent left main, widely patent LAD with ostial stenosis of a small second diagonal branch, otherwise no significant obstruction in the LAD territory. There was a patent left circumflex with mild to moderate nonobstructive plaquing in the mid vessel and patent RCA with mild nonobstructive stenosis, except for a 70% PDA stenosis in a relatively small caliber PDA branch. Medical therapy was recommended. No chest pain.     Medication Adjustments/Labs and Tests Ordered: Current medicines are reviewed at length with the patient today.  Concerns regarding medicines are outlined above.  No orders of the defined types were placed in this encounter.    No orders of the defined types were placed in this encounter.    Patient Instructions  Medication Instructions:  Your physician recommends that you continue on your current medications as directed. Please refer to the Current Medication list given to you today.  *If you need a refill on your cardiac medications before your next appointment, please call your pharmacy*   Lab Work: None ordered  If you have labs (blood work) drawn today and your tests are completely normal, you will receive your results only by: [place] (if you have MyChart) OR A paper copy in the mail If you have any lab test that is abnormal or we need to change your treatment, we will call you to review the results.   Testing/Procedures: None ordered    Other Instructions   Important Information About Sugar         Signed, Angelena Form, PA-C  07/01/2021 3:38 PM    [facility]

## 2021-07-01 ENCOUNTER — Other Ambulatory Visit: Payer: Self-pay | Admitting: Physician Assistant

## 2021-07-01 ENCOUNTER — Ambulatory Visit (INDEPENDENT_AMBULATORY_CARE_PROVIDER_SITE_OTHER): Payer: Medicare Other | Admitting: Physician Assistant

## 2021-07-01 ENCOUNTER — Ambulatory Visit (HOSPITAL_COMMUNITY): Payer: Medicare Other | Attending: Cardiovascular Disease

## 2021-07-01 VITALS — BP 132/74 | HR 75 | Ht 67.0 in | Wt 158.0 lb

## 2021-07-01 DIAGNOSIS — N1832 Chronic kidney disease, stage 3b: Secondary | ICD-10-CM

## 2021-07-01 DIAGNOSIS — Z952 Presence of prosthetic heart valve: Secondary | ICD-10-CM

## 2021-07-01 DIAGNOSIS — I2510 Atherosclerotic heart disease of native coronary artery without angina pectoris: Secondary | ICD-10-CM

## 2021-07-01 DIAGNOSIS — I10 Essential (primary) hypertension: Secondary | ICD-10-CM

## 2021-07-01 LAB — ECHOCARDIOGRAM COMPLETE
AR max vel: 2.37 cm2
AV Area VTI: 2.47 cm2
AV Area mean vel: 2.32 cm2
AV Mean grad: 9.3 mmHg
AV Peak grad: 17.1 mmHg
Ao pk vel: 2.07 m/s
Area-P 1/2: 3.15 cm2
S' Lateral: 2.2 cm

## 2021-07-01 MED ORDER — AMOXICILLIN 500 MG PO CAPS: 2000.0000 mg | ORAL_CAPSULE | ORAL | 2 refills | Status: DC

## 2021-07-01 NOTE — Patient Instructions (Signed)
Medication Instructions:  Your physician recommends that you continue on your current medications as directed. Please refer to the Current Medication list given to you today.  *If you need a refill on your cardiac medications before your next appointment, please call your pharmacy*   Lab Work: None ordered  If you have labs (blood work) drawn today and your tests are completely normal, you will receive your results only by: [place] (if you have MyChart) OR A paper copy in the mail If you have any lab test that is abnormal or we need to change your treatment, we will call you to review the results.   Testing/Procedures: None ordered    Other Instructions   Important Information About Sugar

## 2021-07-08 DIAGNOSIS — E538 Deficiency of other specified B group vitamins: Secondary | ICD-10-CM | POA: Diagnosis not present

## 2021-07-27 ENCOUNTER — Encounter: Payer: Self-pay | Admitting: Family Medicine

## 2021-07-27 ENCOUNTER — Ambulatory Visit (INDEPENDENT_AMBULATORY_CARE_PROVIDER_SITE_OTHER): Payer: Medicare Other | Admitting: Family Medicine

## 2021-07-27 VITALS — BP 126/80 | HR 80 | Temp 98.5°F | Resp 16 | Ht 67.0 in | Wt 156.2 lb

## 2021-07-27 DIAGNOSIS — E782 Mixed hyperlipidemia: Secondary | ICD-10-CM | POA: Diagnosis not present

## 2021-07-27 DIAGNOSIS — I2510 Atherosclerotic heart disease of native coronary artery without angina pectoris: Secondary | ICD-10-CM | POA: Diagnosis not present

## 2021-07-27 DIAGNOSIS — K4090 Unilateral inguinal hernia, without obstruction or gangrene, not specified as recurrent: Secondary | ICD-10-CM | POA: Diagnosis not present

## 2021-07-27 NOTE — Patient Instructions (Addendum)
A few things to remember from today's visit:  Mixed hyperlipidemia  Inguinal hernia of right side without obstruction or gangrene - Plan: Ambulatory referral to General Surgery  If you need refills please call your pharmacy. Do not use My Chart to request refills or for acute issues that need immediate attention.   Please ask Dr Buddy Duty if he can check your cholesterol, otherwise we can do it next visit, fasting labs.  Please be sure medication list is accurate. If a new problem present, please set up appointment sooner than planned today.  Hernia, Adult     A hernia happens when an organ or tissue inside your body pushes out through a weak spot in the muscles of your belly (abdomen). This makes a bulge. The bulge may be: In a scar from a surgery that was done in your belly (incisional hernia). Near your belly button (umbilical hernia). In your groin (inguinal hernia). Your groin is the area where your leg meets your lower belly. If you are a male, this type could also be in your scrotum. In your upper thigh (femoral hernia). Inside your belly (hiatal hernia). This happens when your stomach slides above the muscle between your belly and your chest (diaphragm). What are the causes? This condition may be caused by: Lifting heavy things. Coughing over a long period of time. Having trouble pooping (constipation). Trouble pooping can lead to straining. A cut from surgery in your belly. A physical problem that is present at birth. Being very overweight. Smoking. Too much fluid in your belly. A testicle that has not moved down into the scrotum, in males. What are the signs or symptoms? The main symptom is a bulge in the area of the hernia, but a bulge may not always be seen. It may grow bigger or be easier to see when you cough or strain (such as when lifting something heavy). A hernia that can be pushed back into the belly rarely causes pain. A hernia that cannot be pushed back into the  belly may lose its blood supply. This may cause: Pain. Fever. A feeling like you may vomit, and vomiting. Swelling. Trouble pooping. How is this treated? A hernia that is small and painless may not need to be treated. A hernia that is large or painful may be treated with surgery. Surgery to treat a hernia involves pushing the bulge back into place and repairing the weak area of the muscle or belly. Follow these instructions at home: Activity Avoid straining the muscles near your hernia. This can happen when you: Lift something heavy. Poop (have a bowel movement). Do not lift anything that is heavier than 10 lb (4.5 kg), or the limit that you are told. When you lift something heavy, use your leg muscles. Do not use your back muscles to lift. Prevent trouble pooping If told by your doctor, take steps to prevent trouble pooping. You may need to: Drink enough fluid to keep your pee (urine) pale yellow. Take medicines. You will be told what medicines to take. Eat foods that are high in fiber. These include beans, whole grains, and fresh fruits and vegetables. Limit foods that are high in fat and sugar. These include fried or sweet foods. General instructions When you cough, try to cough gently. You may try to push your hernia back in by gently pressing on it when you are lying down. Do not try to force the bulge back in if it will not go in easily. If you are overweight,  work with your doctor to lose weight safely. Do not smoke or use any products that contain nicotine or tobacco. If you need help quitting, ask your doctor. If you will be having surgery, watch your hernia for changes in shape, size, or color. Tell your doctor if you see any changes. Take over-the-counter and prescription medicines only as told by your doctor. Keep all follow-up visits. Contact a doctor if: You get new pain, swelling, or redness near your hernia. You poop fewer times in a week than normal. You have trouble  pooping. You have poop that is more dry than normal. You have poop that is harder or larger than normal. Get help right away if: You have a fever or chills. You have belly pain that gets worse. You feel like you may vomit, or you vomit. Your hernia cannot be pushed in by gently pressing on it when you are lying down. Your hernia: Changes in shape or size. Changes color. Feels hard, or it hurts when you touch it. These symptoms may be an emergency. Get help right away. Call your local emergency services (911 in the [place] Do not wait to see if the symptoms will go away. Do not drive yourself to the hospital. Summary A hernia happens when an organ or tissue inside your body pushes out through a weak spot in the belly muscles. This creates a bulge. If your hernia is small and it does not hurt, you may not need treatment. If your hernia is large or it hurts, you may need surgery. If you will be having surgery, watch your hernia for changes in shape, size, or color. Tell your doctor about any changes. This information is not intended to replace advice given to you by your health care provider. Make sure you discuss any questions you have with your health care provider. Document Revised: 09/03/2019 Document Reviewed: 09/03/2019 Elsevier Patient Education  [place].

## 2021-07-27 NOTE — Progress Notes (Signed)
Chief Complaint  Patient presents with   Groin Swelling    & pain on the right side x a couple weeks.    HPI:  Daryl Webb is a 76 y.o. male, who is here today with his wife concerned about right-sided protruded area he noted about 2 weeks ago described above. He is mildly tender upon palpation as well as with heavy lifting, which he has tried to avoid. He has not noted changes in size. Negative for fever, chills, abdominal pain, changes in bowel habits, nausea, vomiting, blood in the stool, or urinary symptoms.  His wife is concerned about possible lymph node given his hx of prostate cancer, status post radical prostatectomy years ago.He follows with urologist annually.  Hyperlipidemia: Currently on atorvastatin 40 mg daily. He has tolerated medication well. He has an appointment with Dr. Buddy Duty, his endocrinologist, in 2 days.  Lab Results  Component Value Date   CHOL 94 06/28/2017   HDL 27.90 (L) 06/28/2017   LDLCALC 43 06/28/2017   LDLDIRECT 73.8 11/20/2012   TRIG 112.0 06/28/2017   CHOLHDL 3 06/28/2017   Review of Systems  Constitutional:  Negative for activity change and appetite change.  HENT:  Negative for sore throat and trouble swallowing.   Respiratory:  Negative for cough, shortness of breath and wheezing.   Cardiovascular:  Negative for chest pain and palpitations.  Genitourinary:  Negative for decreased urine volume, dysuria and hematuria.  Neurological:  Negative for dizziness, syncope, weakness and headaches.  Rest see pertinent positives and negatives per HPI.  Current Outpatient Medications on File Prior to Visit  Medication Sig Dispense Refill   ACCU-CHEK SMARTVIEW test strip Use to test blood sugars once daily     amLODipine (NORVASC) 5 MG tablet TAKE 1 TABLET DAILY 90 tablet 1   amoxicillin (AMOXIL) 500 MG capsule Take 4 capsules (2,000 mg total) by mouth See admin instructions. Take 2000 mg 1 hour prior to dental procedures 12 capsule 2    Ascorbic Acid (VITAMIN C) 1000 MG tablet Take 1,000 mg by mouth 2 (two) times a week.     aspirin 81 MG tablet Take 81 mg by mouth daily.     aspirin-acetaminophen-caffeine (EXCEDRIN MIGRAINE) 250-250-65 MG tablet Take 2 tablets by mouth every 6 (six) hours as needed for headache.     atorvastatin (LIPITOR) 40 MG tablet TAKE 1 TABLET DAILY 90 tablet 1   BYDUREON BCISE 2 MG/0.85ML AUIJ Inject 2 mg into the skin every Thursday.     calcium carbonate (TUMS - DOSED IN MG ELEMENTAL CALCIUM) 500 MG chewable tablet Chew 2 tablets by mouth daily as needed for indigestion or heartburn.     Cholecalciferol 2000 UNITS CAPS Take 2,000 Units by mouth 2 (two) times a week.     clotrimazole (LOTRIMIN) 1 % cream Apply 1 application topically 2 (two) times daily as needed (jock itch).     Coenzyme Q10 (COQ10) 100 MG CAPS Take 100 mg by mouth 2 (two) times a week.     [facility] CINNAMON PO Take 1 capsule by mouth 2 (two) times a week.     cyanocobalamin (,VITAMIN B-12,) 1000 MCG/ML injection Inject 1,000 mcg into the muscle every 30 (thirty) days.     dapagliflozin propanediol (FARXIGA) 5 MG TABS tablet Take 5 mg by mouth daily.     diclofenac (VOLTAREN) 50 MG EC tablet Take 1 tablet (50 mg total) by mouth daily as needed. 30 tablet 1   diphenhydrAMINE HCl, Sleep, (ZZZQUIL) 25  MG CAPS Take 25 mg by mouth at bedtime.     losartan-hydrochlorothiazide (HYZAAR) 50-12.5 MG tablet TAKE 1 TABLET DAILY 90 tablet 1   metFORMIN (GLUCOPHAGE) 500 MG tablet Take 500 mg by mouth 2 (two) times daily.     metoprolol succinate (TOPROL-XL) 50 MG 24 hr tablet TAKE 1 TABLET DAILY 90 tablet 2   nystatin ointment (MYCOSTATIN) Apply 1 application topically 3 (three) times daily. (Patient taking differently: Apply 1 application  topically 3 (three) times daily as needed (jock itch).) 30 g 0   OLIVE LEAF EXTRACT PO Take 600 mg by mouth 2 (two) times a week.     Omega-3 Fatty Acids (FISH OIL) 1000 MG CAPS Take 1,000 mg by mouth 2 (two) times a  week.     QUERCETIN PO Take 500 mg by mouth 2 (two) times a week.     traZODone (DESYREL) 50 MG tablet TAKE 0.5-1 TABLETS BY MOUTH AT BEDTIME AS NEEDED FOR SLEEP. 90 tablet 1   fluticasone (FLONASE) 50 MCG/ACT nasal spray Place 1 spray into both nostrils 2 (two) times daily for 28 days. 16 g 1   No current facility-administered medications on file prior to visit.   Past Medical History:  Diagnosis Date   Anemia    Arthritis    CKD (chronic kidney disease)    Constipation due to pain medication    needs stool softner while on pain medication   Diabetes mellitus    Duodenal ulcer 08/13/2015   2014  - Dr Kaplan/GI   H/O: osteoarthritis    History of kidney stones    History of prostate cancer    History of urethral stricture    Hypercholesterolemia    Hypertension    Prostate cancer ([place])    prostate   S/P TAVR (transcatheter aortic valve replacement) 08/12/2020   s/p TAVR with a 29 mm Edwards Sapien 3 THV via the TF approach by [name] and [name]   Severe aortic stenosis    Spinal stenosis    Ulcer duodenal hemorrhage    No Known Allergies  Social History   Socioeconomic History   Marital status: Married    Spouse name: Not on file   Number of children: 2   Years of education: 16   Highest education level: Bachelor's degree (e.g., BA, AB, BS)  Occupational History   Occupation: Retired  Tobacco Use   Smoking status: Former    Packs/day: 1.50    Years: 20.00    Total pack years: 30.00    Types: Cigarettes    Quit date: 02/09/1980    Years since quitting: 41.4   Smokeless tobacco: Never  Vaping Use   Vaping Use: Never used  Substance and Sexual Activity   Alcohol use: No   Drug use: No   Sexual activity: Not Currently  Other Topics Concern   Not on file  Social History Narrative   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    Lives with wife,    two children in [place].    Enjoys reading, boating, travelling to [place] in the winter with wife.   Social  Determinants of Health   Financial Resource Strain: Low Risk  (12/19/2020)   Overall Financial Resource Strain (CARDIA)    Difficulty of Paying Living Expenses: Not hard at all  Food Insecurity: No Food Insecurity (12/19/2020)   Hunger Vital Sign    Worried About Running Out of Food in the Last Year: Never true    Ran Out  of Food in the Last Year: Never true  Transportation Needs: No Transportation Needs (12/19/2020)   PRAPARE - Hydrologist (Medical): No    Lack of Transportation (Non-Medical): No  Physical Activity: Sufficiently Active (12/19/2020)   Exercise Vital Sign    Days of Exercise per Week: 3 days    Minutes of Exercise per Session: 60 min  Stress: No Stress Concern Present (12/19/2020)   [place]    Feeling of Stress : Not at all  Social Connections: Socially Isolated (12/19/2020)   Social Connection and Isolation Panel [NHANES]    Frequency of Communication with Friends and Family: Once a week    Frequency of Social Gatherings with Friends and Family: Once a week    Attends Religious Services: Never    Marine scientist or Organizations: No    Attends Archivist Meetings: Never    Marital Status: Married    Vitals:   07/27/21 1114  BP: 126/80  Pulse: 80  Resp: 16  Temp: 98.5 F (36.9 C)  SpO2: 98%   Body mass index is 24.47 kg/m.  Physical Exam Vitals and nursing note reviewed.  Constitutional:      General: He is not in acute distress.    Appearance: He is well-developed.  HENT:     Head: Normocephalic and atraumatic.  Eyes:     Conjunctiva/sclera: Conjunctivae normal.  Cardiovascular:     Rate and Rhythm: Normal rate and regular rhythm.     Heart sounds: No murmur heard. Pulmonary:     Effort: Pulmonary effort is normal. No respiratory distress.     Breath sounds: Normal breath sounds.  Abdominal:     Palpations: Abdomen is soft. There  is no hepatomegaly or mass.     Tenderness: There is no abdominal tenderness.     Hernia: A hernia is present. Hernia is present in the right inguinal area (Able to reduce while lying down, harder to do so when standing up.Pain elicited upon applying pressure on area.).     Comments: Declined chaperone.   Skin:    General: Skin is warm.     Findings: No erythema or rash.  Neurological:     Mental Status: He is alert and oriented to person, place, and time.     Gait: Gait normal.  Psychiatric:     Comments: Well groomed, good eye contact.   ASSESSMENT AND PLAN:  Daryl Webb was seen today for groin swelling.  Diagnoses and all orders for this visit: Orders Placed This Encounter  Procedures   Ambulatory referral to General Surgery   Inguinal hernia of right side without obstruction or gangrene We discussed diagnosis, prognosis, treatment options. Recommend consultation with general surgeon. We discussed possible complications, instructed about warning signs. Recommend avoiding heavy lifting for now or activities that aggravate pain.  Mixed hyperlipidemia He is not fasting today. Continue atorvastatin 40 mg daily. He has an appointment with his endocrinologist in 2 days, if possible fasting lipid panel can be checked; otherwise we can check it during next visit.  I spent a total of 31 minutes in both face to face and non face to face activities for this visit on the date of this encounter. During this time history was obtained and documented, examination was performed, prior labs reviewed, and assessment/plan discussed.  Return if symptoms worsen or fail to improve, for Keep next appt.  Analese Sovine G. [name], MD  [facility]. [place] office.

## 2021-07-29 DIAGNOSIS — E1122 Type 2 diabetes mellitus with diabetic chronic kidney disease: Secondary | ICD-10-CM | POA: Diagnosis not present

## 2021-07-29 DIAGNOSIS — N1832 Chronic kidney disease, stage 3b: Secondary | ICD-10-CM | POA: Diagnosis not present

## 2021-08-17 ENCOUNTER — Telehealth: Payer: Self-pay | Admitting: Internal Medicine

## 2021-08-19 DIAGNOSIS — I700 Atherosclerosis of aorta: Secondary | ICD-10-CM | POA: Diagnosis not present

## 2021-08-19 DIAGNOSIS — Z9079 Acquired absence of other genital organ(s): Secondary | ICD-10-CM | POA: Diagnosis not present

## 2021-08-19 DIAGNOSIS — K4090 Unilateral inguinal hernia, without obstruction or gangrene, not specified as recurrent: Secondary | ICD-10-CM | POA: Diagnosis not present

## 2021-08-19 DIAGNOSIS — Z952 Presence of prosthetic heart valve: Secondary | ICD-10-CM | POA: Diagnosis not present

## 2021-08-20 DIAGNOSIS — E538 Deficiency of other specified B group vitamins: Secondary | ICD-10-CM | POA: Diagnosis not present

## 2021-08-24 NOTE — Telephone Encounter (Signed)
*  STAT* If patient is at the pharmacy, call can be transferred to refill team.   1. Which medications need to be refilled? (please list name of each medication and dose if known)   amLODipine (NORVASC) 5 MG tablet  atorvastatin (LIPITOR) 40 MG tablet  2. Which pharmacy/location (including street and city if local pharmacy) is medication to be sent to?  [facility] [place] [place] [facility]   3. Do they need a 30 day or 90 day supply? 90 day  Patient stated he is running out on this medication.

## 2021-08-25 NOTE — Telephone Encounter (Signed)
Patient is following up, very concerned because [facility] still does not have his Rx.  *STAT* If patient is at the pharmacy, call can be transferred to refill team.   1. Which medications need to be refilled? (please list name of each medication and dose if known)  amLODipine (NORVASC) 5 MG tablet atorvastatin (LIPITOR) 40 MG tablet  2. Which pharmacy/location (including street and city if local pharmacy) is medication to be sent to? [facility] [place] [place] [facility]  3. Do they need a 30 day or 90 day supply?  90 day supply

## 2021-08-26 ENCOUNTER — Telehealth: Payer: Self-pay | Admitting: Internal Medicine

## 2021-08-26 MED ORDER — AMLODIPINE BESYLATE 5 MG PO TABS: 5.0000 mg | ORAL_TABLET | Freq: Every day | ORAL | 2 refills | Status: DC

## 2021-08-26 MED ORDER — ATORVASTATIN CALCIUM 40 MG PO TABS
40.0000 mg | ORAL_TABLET | Freq: Every day | ORAL | 3 refills | Status: DC
Start: 2021-08-26 — End: 2022-09-06

## 2021-08-26 NOTE — Telephone Encounter (Signed)
*  STAT* If patient is at the pharmacy, call can be transferred to refill team.   1. Which medications need to be refilled? (please list name of each medication and dose if known) amLODipine (NORVASC) 5 MG tablet atorvastatin (LIPITOR) 40 MG tablet  2. Which pharmacy/location (including street and city if local pharmacy) is medication to be sent to? [facility] [place] [place] [facility]  3. Do they need a 30 day or 90 day supply? 90    Ref # for pharmacy : [id number] opt 2

## 2021-08-27 DIAGNOSIS — L821 Other seborrheic keratosis: Secondary | ICD-10-CM | POA: Diagnosis not present

## 2021-08-27 DIAGNOSIS — Z85828 Personal history of other malignant neoplasm of skin: Secondary | ICD-10-CM | POA: Diagnosis not present

## 2021-08-27 DIAGNOSIS — L570 Actinic keratosis: Secondary | ICD-10-CM | POA: Diagnosis not present

## 2021-08-27 DIAGNOSIS — C44519 Basal cell carcinoma of skin of other part of trunk: Secondary | ICD-10-CM | POA: Diagnosis not present

## 2021-08-27 DIAGNOSIS — D225 Melanocytic nevi of trunk: Secondary | ICD-10-CM | POA: Diagnosis not present

## 2021-08-28 ENCOUNTER — Other Ambulatory Visit: Payer: Self-pay | Admitting: Internal Medicine

## 2021-09-07 DIAGNOSIS — Z85828 Personal history of other malignant neoplasm of skin: Secondary | ICD-10-CM | POA: Diagnosis not present

## 2021-09-07 DIAGNOSIS — C44519 Basal cell carcinoma of skin of other part of trunk: Secondary | ICD-10-CM | POA: Diagnosis not present

## 2021-09-08 ENCOUNTER — Encounter: Payer: Self-pay | Admitting: Internal Medicine

## 2021-09-08 ENCOUNTER — Telehealth: Payer: Self-pay | Admitting: Internal Medicine

## 2021-09-08 NOTE — Telephone Encounter (Signed)
Follow Up:     Patient's wife is calling to find out the status of patient's clearance. Wife said patient's surgeon said they had not received clearance and told wife to check on it.

## 2021-09-09 ENCOUNTER — Telehealth: Payer: Self-pay | Admitting: *Deleted

## 2021-09-09 NOTE — Telephone Encounter (Signed)
Pt has been scheduled for a tele visit 09/14/21.

## 2021-09-09 NOTE — Telephone Encounter (Signed)
Pt has read mychart communication regarding surgical clearance.

## 2021-09-09 NOTE — Telephone Encounter (Signed)
Pt has been scheduled for a tele visit, 09/14/21.  Consent on file / medications reconciled.    Patient Consent for Virtual Visit        Daryl Webb has provided verbal consent on 09/09/2021 for a virtual visit (video or telephone).   CONSENT FOR VIRTUAL VISIT FOR:  Daryl Webb  By participating in this virtual visit I agree to the following:  I hereby voluntarily request, consent and authorize [facility] and its employed or contracted physicians, physician assistants, nurse practitioners or other licensed health care professionals (the Practitioner), to provide me with telemedicine health care services (the "Services") as deemed necessary by the treating Practitioner. I acknowledge and consent to receive the Services by the Practitioner via telemedicine. I understand that the telemedicine visit will involve communicating with the Practitioner through live audiovisual communication technology and the disclosure of certain medical information by electronic transmission. I acknowledge that I have been given the opportunity to request an in-person assessment or other available alternative prior to the telemedicine visit and am voluntarily participating in the telemedicine visit.  I understand that I have the right to withhold or withdraw my consent to the use of telemedicine in the course of my care at any time, without affecting my right to future care or treatment, and that the Practitioner or I may terminate the telemedicine visit at any time. I understand that I have the right to inspect all information obtained and/or recorded in the course of the telemedicine visit and may receive copies of available information for a reasonable fee.  I understand that some of the potential risks of receiving the Services via telemedicine include:  Delay or interruption in medical evaluation due to technological equipment failure or disruption; Information transmitted may not be sufficient  (e.g. poor resolution of images) to allow for appropriate medical decision making by the Practitioner; and/or  In rare instances, security protocols could fail, causing a breach of personal health information.  Furthermore, I acknowledge that it is my responsibility to provide information about my medical history, conditions and care that is complete and accurate to the best of my ability. I acknowledge that Practitioner's advice, recommendations, and/or decision may be based on factors not within their control, such as incomplete or inaccurate data provided by me or distortions of diagnostic images or specimens that may result from electronic transmissions. I understand that the practice of medicine is not an exact science and that Practitioner makes no warranties or guarantees regarding treatment outcomes. I acknowledge that a copy of this consent can be made available to me via my patient portal ([facility]), or I can request a printed copy by calling the office of [facility].    I understand that my insurance will be billed for this visit.   I have read or had this consent read to me. I understand the contents of this consent, which adequately explains the benefits and risks of the Services being provided via telemedicine.  I have been provided ample opportunity to ask questions regarding this consent and the Services and have had my questions answered to my satisfaction. I give my informed consent for the services to be provided through the use of telemedicine in my medical care

## 2021-09-09 NOTE — Telephone Encounter (Signed)
   Pre-operative Risk Assessment    Patient Name: Daryl Webb  DOB: Aug 02, 1945 MRN: [id number]      Request for Surgical Clearance    Procedure:   HERNIA  Date of Surgery:  Clearance TBD                                 Surgeon:  Gurney Maxin, MD Surgeon's Group or Practice Name:  [facility] Phone number:  [phone] Fax number:  [phone]  ATTN:  KELSEY PHILLIPS   Type of Clearance Requested:   - Medical  - Pharmacy:  Hold Aspirin NOT INDICATED   Type of Anesthesia:  General    Additional requests/questions:    Astrid Divine   09/09/2021, 9:23 AM

## 2021-09-13 NOTE — Progress Notes (Signed)
Virtual Visit via Telephone Note   Because of Daryl Webb's co-morbid illnesses, he is at least at moderate risk for complications without adequate follow up.  This format is felt to be most appropriate for this patient at this time.  The patient did not have access to video technology/had technical difficulties with video requiring transitioning to audio format only (telephone).  All issues noted in this document were discussed and addressed.  No physical exam could be performed with this format.  Please refer to the patient's chart for his consent to telehealth for [facility].  Evaluation Performed:  Preoperative cardiovascular risk assessment _____________   Date:  09/13/2021   Patient ID:  Daryl Webb, DOB Jan 13, 1946, MRN [id number] Patient Location:  Home Provider location:   Office  Primary Care Provider:  [name], [name], MD Primary Cardiologist:  Daryl Casino, MD  Chief Complaint / Patient Profile   76 y.o. y/o male with a h/o hypertension, non-obstructive CAD hyperlipidemia, type 2 diabetes, CKD stage III, severe aortic stenosis s/p TAVR 08/2020 who is pending hernia repair and presents today for telephonic preoperative cardiovascular risk assessment.  Past Medical History    Past Medical History:  Diagnosis Date   Anemia    Arthritis    CKD (chronic kidney disease)    Constipation due to pain medication    needs stool softner while on pain medication   Diabetes mellitus    Duodenal ulcer 08/13/2015   2014  - Dr Daryl Webb/GI   H/O: osteoarthritis    History of kidney stones    History of prostate cancer    History of urethral stricture    Hypercholesterolemia    Hypertension    Prostate cancer ([place])    prostate   S/P TAVR (transcatheter aortic valve replacement) 08/12/2020   s/p TAVR with a 29 mm Edwards Sapien 3 THV via the TF approach by Dr. Burt Webb and Dr. Cyndia Webb   Severe aortic stenosis    Spinal stenosis    Ulcer duodenal hemorrhage     Past Surgical History:  Procedure Laterality Date   ARTHROPLASTY  2004   right knee Dr. Jeanie Webb SURGERY     CARDIAC CATHETERIZATION     CARPAL TUNNEL RELEASE     right hand,wrist Dr. Daylene Webb   CYSTOSCOPY N/A 09/20/2012   Procedure: Daryl Webb;  Surgeon: Daryl Gray, MD;  Location: [facility];  Service: Urology;  Laterality: N/A;   JOINT REPLACEMENT Right    KNEE ARTHROSCOPY Right    LUMBAR LAMINECTOMY  02/08/2013   PROSTATECTOMY     radical   RIGHT/LEFT HEART CATH AND CORONARY ANGIOGRAPHY N/A 05/08/2020   Procedure: RIGHT/LEFT HEART CATH AND CORONARY ANGIOGRAPHY;  Surgeon: Daryl Mocha, MD;  Location: [facility];  Service: Cardiovascular;  Laterality: N/A;   TEE WITHOUT CARDIOVERSION N/A 08/12/2020   Procedure: TRANSESOPHAGEAL ECHOCARDIOGRAM (TEE);  Surgeon: Daryl Mocha, MD;  Location: [facility];  Service: Open Heart Surgery;  Laterality: N/A;   TRANSCATHETER AORTIC VALVE REPLACEMENT, TRANSFEMORAL N/A 08/12/2020   Procedure: TRANSCATHETER AORTIC VALVE REPLACEMENT, TRANSFEMORAL;  Surgeon: Daryl Mocha, MD;  Location: [facility];  Service: Open Heart Surgery;  Laterality: N/A;   VASECTOMY  1985    Allergies  No Known Allergies  History of Present Illness    Daryl Webb is a 76 y.o. male who presents via audio/video conferencing for a telehealth visit today.  Pt was last seen in cardiology clinic on 07/01/21 by  Daryl Webb, [place].  At that time Daryl Webb was doing well.  The patient is now pending procedure as outlined above. Since his last visit, he denies chest pain, shortness of breath, lower extremity edema, fatigue, palpitations, melena, hematuria, hemoptysis, diaphoresis, weakness, presyncope, syncope, orthopnea, and PND.  Home Medications    Prior to Admission medications   Medication Sig Start Date End Date Taking? Authorizing Provider  ACCU-CHEK SMARTVIEW test strip Use to test blood sugars once daily 07/19/19    [provider]  amLODipine (NORVASC) 5 MG tablet Take 1 tablet (5 mg total) by mouth daily. 08/26/21   Hilty, Nadean Corwin, MD  amoxicillin (AMOXIL) 500 MG capsule Take 4 capsules (2,000 mg total) by mouth See admin instructions. Take 2000 mg 1 hour prior to dental procedures 07/01/21   Daryl Stanford, PA-C  Ascorbic Acid (VITAMIN C) 1000 MG tablet Take 1,000 mg by mouth 2 (two) times a week.    [provider]  aspirin 81 MG tablet Take 81 mg by mouth daily.    [provider]  aspirin-acetaminophen-caffeine (EXCEDRIN MIGRAINE) [id number] MG tablet Take 2 tablets by mouth every 6 (six) hours as needed for headache.    [provider]  atorvastatin (LIPITOR) 40 MG tablet Take 1 tablet (40 mg total) by mouth daily. 08/26/21   Hilty, Nadean Corwin, MD  calcium carbonate (TUMS - DOSED IN MG ELEMENTAL CALCIUM) 500 MG chewable tablet Chew 2 tablets by mouth daily as needed for indigestion or heartburn.    [provider]  Cholecalciferol 2000 UNITS CAPS Take 2,000 Units by mouth 2 (two) times a week.    [provider]  clotrimazole (LOTRIMIN) 1 % cream Apply 1 application topically 2 (two) times daily as needed (jock itch).    [provider]  Coenzyme Q10 (COQ10) 100 MG CAPS Take 100 mg by mouth 2 (two) times a week.    [provider]  [facility] CINNAMON PO Take 1 capsule by mouth 2 (two) times a week.    [provider]  cyanocobalamin (,VITAMIN B-12,) 1000 MCG/ML injection Inject 1,000 mcg into the muscle every 30 (thirty) days.    [provider]  dapagliflozin propanediol (FARXIGA) 10 MG TABS tablet Take 10 mg by mouth daily.    [provider]  diclofenac (VOLTAREN) 50 MG EC tablet Take 1 tablet (50 mg total) by mouth daily as needed. 04/23/21   [name], [name], MD  diphenhydrAMINE HCl, Sleep, (ZZZQUIL) 25 MG CAPS Take 25 mg by mouth at bedtime.    [provider]  losartan-hydrochlorothiazide (HYZAAR)  50-12.5 MG tablet TAKE 1 TABLET DAILY 08/31/21   Hilty, Nadean Corwin, MD  metFORMIN (GLUCOPHAGE) 500 MG tablet Take 500 mg by mouth 2 (two) times daily. 05/01/19   [provider]  metoprolol succinate (TOPROL-XL) 50 MG 24 hr tablet TAKE 1 TABLET DAILY 04/30/21   Hilty, Nadean Corwin, MD  nystatin ointment (MYCOSTATIN) Apply 1 application topically 3 (three) times daily. Patient taking differently: Apply 1 application  topically 3 (three) times daily as needed (jock itch). 08/01/20   Gaye Pollack, MD  OLIVE LEAF EXTRACT PO Take 600 mg by mouth 2 (two) times a week.    [provider]  Omega-3 Fatty Acids (FISH OIL) 1000 MG CAPS Take 1,000 mg by mouth 2 (two) times a week.    [provider]  QUERCETIN PO Take 500 mg by mouth 2 (two) times a week.    [provider]  traZODone (DESYREL) 50 MG tablet TAKE 0.5-1 TABLETS BY MOUTH AT BEDTIME AS NEEDED FOR SLEEP. 06/19/21   [name], [name], MD    Physical Exam    Vital Signs:  Marcos Ruelas Domino does not have vital signs available for review today.  Given telephonic nature of communication, physical exam is limited. AAOx3. NAD. Normal affect.  Speech and respirations are unlabored.  Accessory Clinical Findings    None  Assessment & Plan    1.  Preoperative Cardiovascular Risk Assessment: The patient is doing well from a cardiac perspective. Therefore, based on ACC/AHA guidelines, the patient would be at acceptable risk for the planned procedure without further cardiovascular testing. The patient was advised that if he develops new symptoms prior to surgery to contact our office to arrange for a follow-up visit, and he verbalized understanding. According to the Revised Cardiac Risk Index (RCRI), his Perioperative Risk of Major Cardiac Event is (%): 0.4. His Functional Capacity in METs is: 6.61 according to the [facility] Activity Status Index (DASI). He may hold aspirin x 7 days   A copy of this note will be routed to  requesting surgeon.  Time:   Today, I have spent 10 minutes with the patient with telehealth technology discussing medical history, symptoms, and management plan.    Emmaline Life, NP-C    09/14/2021, 2:41 PM [facility] [address] Office [phone] Fax [phone]

## 2021-09-14 ENCOUNTER — Encounter: Payer: Self-pay | Admitting: Nurse Practitioner

## 2021-09-14 ENCOUNTER — Ambulatory Visit (INDEPENDENT_AMBULATORY_CARE_PROVIDER_SITE_OTHER): Payer: Medicare Other | Admitting: Nurse Practitioner

## 2021-09-14 DIAGNOSIS — Z01810 Encounter for preprocedural cardiovascular examination: Secondary | ICD-10-CM

## 2021-09-24 DIAGNOSIS — E538 Deficiency of other specified B group vitamins: Secondary | ICD-10-CM | POA: Diagnosis not present

## 2021-09-28 DIAGNOSIS — N35012 Post-traumatic membranous urethral stricture: Secondary | ICD-10-CM | POA: Diagnosis not present

## 2021-09-28 DIAGNOSIS — Z8546 Personal history of malignant neoplasm of prostate: Secondary | ICD-10-CM | POA: Diagnosis not present

## 2021-10-21 NOTE — Progress Notes (Deleted)
HPI:  [name] is a 76 y.o. male with hx of DM II,CKD III,HLD,and OA here today to follow on recent visit. Hypertension:  Medications:*** BP readings at home:*** Side effects:***  Negative for unusual or severe headache, visual changes, exertional chest pain, dyspnea,  focal weakness, or edema.  Lab Results  Component Value Date   CREATININE 1.60 (H) 04/22/2021   BUN 29 (H) 04/22/2021   NA 134 (L) 04/22/2021   K 4.5 04/22/2021   CL 99 04/22/2021   CO2 28 04/22/2021    Lab Results  Component Value Date   CHOL 94 06/28/2017   HDL 27.90 (L) 06/28/2017   LDLCALC 43 06/28/2017   LDLDIRECT 73.8 11/20/2012   TRIG 112.0 06/28/2017   CHOLHDL 3 06/28/2017   Insomnia: On Trazodone *** Review of Systems Rest see pertinent positives and negatives per HPI.  Current Outpatient Medications on File Prior to Visit  Medication Sig Dispense Refill   ACCU-CHEK SMARTVIEW test strip Use to test blood sugars once daily     amLODipine (NORVASC) 5 MG tablet Take 1 tablet (5 mg total) by mouth daily. 90 tablet 2   amoxicillin (AMOXIL) 500 MG capsule Take 4 capsules (2,000 mg total) by mouth See admin instructions. Take 2000 mg 1 hour prior to dental procedures 12 capsule 2   Ascorbic Acid (VITAMIN C) 1000 MG tablet Take 1,000 mg by mouth 2 (two) times a week.     aspirin 81 MG tablet Take 81 mg by mouth daily.     aspirin-acetaminophen-caffeine (EXCEDRIN MIGRAINE) 250-250-65 MG tablet Take 2 tablets by mouth every 6 (six) hours as needed for headache.     atorvastatin (LIPITOR) 40 MG tablet Take 1 tablet (40 mg total) by mouth daily. 90 tablet 3   calcium carbonate (TUMS - DOSED IN MG ELEMENTAL CALCIUM) 500 MG chewable tablet Chew 2 tablets by mouth daily as needed for indigestion or heartburn.     Cholecalciferol 2000 UNITS CAPS Take 2,000 Units by mouth 2 (two) times a week.     clotrimazole (LOTRIMIN) 1 % cream Apply 1 application topically 2 (two) times daily as needed (jock  itch).     Coenzyme Q10 (COQ10) 100 MG CAPS Take 100 mg by mouth 2 (two) times a week.     [employer] CINNAMON PO Take 1 capsule by mouth 2 (two) times a week.     dapagliflozin propanediol (FARXIGA) 10 MG TABS tablet Take 10 mg by mouth daily.     diclofenac (VOLTAREN) 50 MG EC tablet Take 1 tablet (50 mg total) by mouth daily as needed. 30 tablet 1   diphenhydrAMINE HCl, Sleep, (ZZZQUIL) 25 MG CAPS Take 25 mg by mouth at bedtime.     losartan-hydrochlorothiazide (HYZAAR) 50-12.5 MG tablet TAKE 1 TABLET DAILY 90 tablet 1   metFORMIN (GLUCOPHAGE) 500 MG tablet Take 500 mg by mouth 2 (two) times daily.     metoprolol succinate (TOPROL-XL) 50 MG 24 hr tablet TAKE 1 TABLET DAILY 90 tablet 2   nystatin ointment (MYCOSTATIN) Apply 1 application topically 3 (three) times daily. (Patient taking differently: Apply 1 application  topically 3 (three) times daily as needed (jock itch).) 30 g 0   OLIVE LEAF EXTRACT PO Take 600 mg by mouth 2 (two) times a week.     Omega-3 Fatty Acids (FISH OIL) 1000 MG CAPS Take 1,000 mg by mouth 2 (two) times a week.     QUERCETIN PO Take 500 mg by mouth 2 (two)  times a week.     traZODone (DESYREL) 50 MG tablet TAKE 0.5-1 TABLETS BY MOUTH AT BEDTIME AS NEEDED FOR SLEEP. 90 tablet 1   No current facility-administered medications on file prior to visit.    Past Medical History:  Diagnosis Date   Anemia    Arthritis    CKD (chronic kidney disease)    Constipation due to pain medication    needs stool softner while on pain medication   Diabetes mellitus    Duodenal ulcer 08/13/2015   2014  - Dr Kaplan/GI   H/O: osteoarthritis    History of kidney stones    History of prostate cancer    History of urethral stricture    Hypercholesterolemia    Hypertension    Prostate cancer ([place])    prostate   S/P TAVR (transcatheter aortic valve replacement) 08/12/2020   s/p TAVR with a 29 mm Edwards Sapien 3 THV via the TF approach by [name] and [name]   Severe aortic  stenosis    Spinal stenosis    Ulcer duodenal hemorrhage    No Known Allergies  Social History   Socioeconomic History   Marital status: Married    Spouse name: Not on file   Number of children: 2   Years of education: 16   Highest education level: Bachelor's degree (e.g., BA, AB, BS)  Occupational History   Occupation: Retired  Tobacco Use   Smoking status: Former    Packs/day: 1.50    Years: 20.00    Total pack years: 30.00    Types: Cigarettes    Quit date: 02/09/1980    Years since quitting: 41.7   Smokeless tobacco: Never  Vaping Use   Vaping Use: Never used  Substance and Sexual Activity   Alcohol use: No   Drug use: No   Sexual activity: Not Currently  Other Topics Concern   Not on file  Social History Narrative   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    Lives with wife,    two children in [place].    Enjoys reading, boating, travelling to [place] in the winter with wife.   Social Determinants of Health   Financial Resource Strain: Low Risk  (12/19/2020)   Overall Financial Resource Strain (CARDIA)    Difficulty of Paying Living Expenses: Not hard at all  Food Insecurity: No Food Insecurity (12/19/2020)   Hunger Vital Sign    Worried About Running Out of Food in the Last Year: Never true    Ran Out of Food in the Last Year: Never true  Transportation Needs: No Transportation Needs (12/19/2020)   PRAPARE - Hydrologist (Medical): No    Lack of Transportation (Non-Medical): No  Physical Activity: Sufficiently Active (12/19/2020)   Exercise Vital Sign    Days of Exercise per Week: 3 days    Minutes of Exercise per Session: 60 min  Stress: No Stress Concern Present (12/19/2020)   [place]    Feeling of Stress : Not at all  Social Connections: Socially Isolated (12/19/2020)   Social Connection and Isolation Panel [NHANES]    Frequency of Communication with  Friends and Family: Once a week    Frequency of Social Gatherings with Friends and Family: Once a week    Attends Religious Services: Never    Marine scientist or Organizations: No    Attends Archivist Meetings: Never    Marital  Status: Married    There were no vitals filed for this visit. There is no height or weight on file to calculate BMI.  Physical Exam  ASSESSMENT AND PLAN:   There are no diagnoses linked to this encounter.  No orders of the defined types were placed in this encounter.   No problem-specific Assessment & Plan notes found for this encounter.   No follow-ups on file.   [name]. [name], MD  [facility]. [place] office.

## 2021-10-23 ENCOUNTER — Ambulatory Visit: Payer: Medicare Other | Admitting: Family Medicine

## 2021-10-23 DIAGNOSIS — M159 Polyosteoarthritis, unspecified: Secondary | ICD-10-CM

## 2021-10-23 DIAGNOSIS — N1832 Chronic kidney disease, stage 3b: Secondary | ICD-10-CM

## 2021-10-23 DIAGNOSIS — G4700 Insomnia, unspecified: Secondary | ICD-10-CM

## 2021-10-23 DIAGNOSIS — E782 Mixed hyperlipidemia: Secondary | ICD-10-CM

## 2021-10-29 DIAGNOSIS — E538 Deficiency of other specified B group vitamins: Secondary | ICD-10-CM | POA: Diagnosis not present

## 2021-11-04 NOTE — Progress Notes (Signed)
HPI: [name] is a 76 y.o. male with hx of DM II,CKD III,HLD,and OA here today for follow up.  He was last seen on 07/27/21. Since his last visit he has seen general surgeon and planning on having hernia surgery repair on 12/03/21.  He has also seen his endocrinologist. Concerned about elevated BS's. Last HgA1C was 7.1 on 07/29/21. He is on Metformin and [place].  Generalized OA: He is on Diclofenac EC 75 mg daily, last visit we decreased dose to 50 mg. States that his endocrinologist increased dose because worsening shoulder pain. Also affecting knees, back,and IP joints. No edema or erythema. Pain exacerbated by outdoor activities.  Hypertension:  Medications:Metoprolol succinate 50 mg daily, Losartan-HCTZ 50-12.5 mg daily,and Amlodipine 5 mg daily. Negative for unusual or severe headache, visual changes, exertional chest pain, dyspnea,  focal weakness, or edema. 07/29/21 Cr 1.72 and e GFR 41. He has not noted foam in urine or decreased urine output.  Lab Results  Component Value Date   CREATININE 1.60 (H) 04/22/2021   BUN 29 (H) 04/22/2021   NA 134 (L) 04/22/2021   K 4.5 04/22/2021   CL 99 04/22/2021   CO2 28 04/22/2021   HLD: He is on Atorvastatin 40 mg daily. Last FLP 07/29/21: TC 104,HDL 31,LDL 52, and TG 117.  Insomnia: She is not taking Trazodone. Started OTC ZZquil helps some, sleeps about 5-6 hours. Nocturia affects sleep, wakes up around 3 am. Sometimes he cannot go back to sleep.  For a week he has had productive cough, nasal congestion,and rhinorhea. Feeling better. Symptoms worse on the morning. Requesting abx treatments. Negative for fever,wheezing,or DOE.  Review of Systems  Constitutional:  Positive for fatigue. Negative for activity change, appetite change and fever.  HENT:  Negative for nosebleeds and sore throat.   Gastrointestinal:  Negative for abdominal pain, nausea and vomiting.  Genitourinary:  Negative for dysuria and hematuria.   Musculoskeletal:  Positive for arthralgias.  Neurological:  Negative for syncope and facial asymmetry.  Rest see pertinent positives and negatives per HPI.  Current Outpatient Medications on File Prior to Visit  Medication Sig Dispense Refill   ACCU-CHEK SMARTVIEW test strip Use to test blood sugars once daily     amLODipine (NORVASC) 5 MG tablet Take 1 tablet (5 mg total) by mouth daily. 90 tablet 2   amoxicillin (AMOXIL) 500 MG capsule Take 4 capsules (2,000 mg total) by mouth See admin instructions. Take 2000 mg 1 hour prior to dental procedures 12 capsule 2   Ascorbic Acid (VITAMIN C) 1000 MG tablet Take 1,000 mg by mouth 2 (two) times a week.     aspirin 81 MG tablet Take 81 mg by mouth daily.     aspirin-acetaminophen-caffeine (EXCEDRIN MIGRAINE) 250-250-65 MG tablet Take 2 tablets by mouth every 6 (six) hours as needed for headache.     atorvastatin (LIPITOR) 40 MG tablet Take 1 tablet (40 mg total) by mouth daily. 90 tablet 3   calcium carbonate (TUMS - DOSED IN MG ELEMENTAL CALCIUM) 500 MG chewable tablet Chew 2 tablets by mouth daily as needed for indigestion or heartburn.     Cholecalciferol 2000 UNITS CAPS Take 2,000 Units by mouth 2 (two) times a week.     clotrimazole (LOTRIMIN) 1 % cream Apply 1 application topically 2 (two) times daily as needed (jock itch).     Coenzyme Q10 (COQ10) 100 MG CAPS Take 100 mg by mouth 2 (two) times a week.     dapagliflozin propanediol (  FARXIGA) 10 MG TABS tablet Take 10 mg by mouth daily.     diclofenac (VOLTAREN) 75 MG EC tablet Take 75 mg by mouth 2 (two) times daily.     diphenhydrAMINE HCl, Sleep, (ZZZQUIL) 25 MG CAPS Take 25 mg by mouth at bedtime.     losartan-hydrochlorothiazide (HYZAAR) 50-12.5 MG tablet TAKE 1 TABLET DAILY 90 tablet 1   metFORMIN (GLUCOPHAGE) 500 MG tablet Take 500 mg by mouth 2 (two) times daily.     metoprolol succinate (TOPROL-XL) 50 MG 24 hr tablet TAKE 1 TABLET DAILY 90 tablet 2   nystatin ointment (MYCOSTATIN)  Apply 1 application topically 3 (three) times daily. (Patient taking differently: Apply 1 application  topically 3 (three) times daily as needed (jock itch).) 30 g 0   OLIVE LEAF EXTRACT PO Take 600 mg by mouth 2 (two) times a week.     Omega-3 Fatty Acids (FISH OIL) 1000 MG CAPS Take 1,000 mg by mouth 2 (two) times a week.     QUERCETIN PO Take 500 mg by mouth 2 (two) times a week.     No current facility-administered medications on file prior to visit.   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 Burt Webb and Dr. Cyndia Webb   Severe aortic stenosis    Spinal stenosis    Ulcer duodenal hemorrhage    No Known Allergies  Social History   Socioeconomic History   Marital status: Married    Spouse name: Not on file   Number of children: 2   Years of education: 16   Highest education level: Bachelor's degree (e.g., BA, AB, BS)  Occupational History   Occupation: Retired  Tobacco Use   Smoking status: Former    Packs/day: 1.50    Years: 20.00    Total pack years: 30.00    Types: Cigarettes    Quit date: 02/09/1980    Years since quitting: 41.7   Smokeless tobacco: Never  Vaping Use   Vaping Use: Never used  Substance and Sexual Activity   Alcohol use: No   Drug use: No   Sexual activity: Not Currently  Other Topics Concern   Not on file  Social History Narrative   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    Lives with wife,    two children in  [place].    Enjoys reading, boating, travelling to [place] in the winter with wife.   Social Determinants of Health   Financial Resource Strain: Low Risk  (12/19/2020)   Overall Financial Resource Strain (CARDIA)    Difficulty of Paying Living Expenses: Not hard at all  Food Insecurity: No Food Insecurity (12/19/2020)   Hunger Vital Sign    Worried About Running Out of Food in the Last Year: Never true    Ran Out of Food in the Last Year:  Never true  Transportation Needs: No Transportation Needs (12/19/2020)   PRAPARE - Hydrologist (Medical): No    Lack of Transportation (Non-Medical): No  Physical Activity: Sufficiently Active (12/19/2020)   Exercise Vital Sign    Days of Exercise per Week: 3 days    Minutes of Exercise per Session: 60 min  Stress: No Stress Concern Present (12/19/2020)   [place]    Feeling of Stress : Not at all  Social Connections: Socially Isolated (12/19/2020)   Social Connection and Isolation Panel [NHANES]    Frequency of Communication with Friends and Family: Once a week    Frequency of Social Gatherings with Friends and Family: Once a week    Attends Religious Services: Never    Marine scientist or Organizations: No    Attends Archivist Meetings: Never    Marital Status: Married   Vitals:   11/06/21 1113  BP: 118/78  Pulse: 71  Resp: 18  Temp: 98.5 F (36.9 C)  SpO2: 97%   Body mass index is 25.53 kg/m.  Physical Exam Vitals and nursing note reviewed.  Constitutional:      General: He is not in acute distress.    Appearance: He is well-developed and well-groomed.  HENT:     Head: Normocephalic and atraumatic.     Nose: Rhinorrhea present. No congestion.     Right Turbinates: Not enlarged.     Left Turbinates: Not enlarged.     Right Sinus: No maxillary sinus tenderness or frontal sinus tenderness.     Left Sinus: No maxillary  sinus tenderness or frontal sinus tenderness.  Eyes:     Conjunctiva/sclera: Conjunctivae normal.  Cardiovascular:     Rate and Rhythm: Normal rate and regular rhythm.     Pulses:          Dorsalis pedis pulses are 2+ on the right side and 2+ on the left side.     Heart sounds: No murmur heard. Pulmonary:     Effort: Pulmonary effort is normal. No respiratory distress.     Breath sounds: Normal breath sounds.  Abdominal:     Palpations: Abdomen is soft. There is no mass.     Tenderness: There is no abdominal tenderness.  Musculoskeletal:     Comments: No signs of synovitis.  Lymphadenopathy:     Cervical: No cervical adenopathy.  Skin:    General: Skin is warm.     Findings: No erythema or rash.  Neurological:     Mental Status: He is alert and oriented to person, place, and time.     Cranial Nerves: No cranial nerve deficit.     Comments: Antalgic gait, not assisted.  Psychiatric:        Mood and Affect: Affect normal. Mood is anxious.   ASSESSMENT AND PLAN:  [name] was seen today for follow-up.  Diagnoses and all orders for this visit:  Respiratory tract infection Symptoms are improving. Findings today do not suggest a bacterial infection. Explained that cough and congestion can last a few more weeks. I do not think abx is needed at this time. Sent Rx for Doxycycline to start if symptoms get worse of fever develops.  -     doxycycline (VIBRA-TABS) 100 MG tablet; Take 1 tablet (100 mg total) by mouth 2 (two) times daily for 7 days.  Essential hypertension BP adequately controlled. Continue Metoprolol succinate 50 mg daily, Losartan-HCTZ 50-12.5 mg  daily,and Amlodipine 5 mg daily. Continue low salt diet.  Generalized osteoarthritis of multiple sites Problem has been otherwise stable. According to pt, his endocrinologist increased Diclofenac dose because 50 mg was not helping with pain, specially shoulder pain. We discussed side effects of NSAID's (CV,renal,and  GI) and he voices understanding,he want to continue taking medication.  Type 2 diabetes mellitus with renal manifestations (HCC) Last HgA1C 7.1, which I think it is appropriate for him. Continue following with endocrinologist. On Metformin and Farxiga. Last Cr in 07/2021 was 1.7.  CKD (chronic kidney disease), stage III ([place]) Problem has been otherwise stable. Cr went from 1.6 to 1.7 and e GFR stable at 41. He understands side effects of Diclofenac. Continue adequate hydration and low salt diet.  Mixed hyperlipidemia LDL is at goal, 52 in 07/2021. Continue Atorvastatin 40 mg daily and low fat diet.  Insomnia He does not want to try Trazodone. Continue Zzzquil at bedtime as needed with caution. Good sleep hygiene.  I spent a total of 42 minutes in both face to face and non face to face activities for this visit on the date of this encounter. During this time history was obtained and documented, examination was performed, prior labs reviewed, and assessment/plan discussed.  Return in about 6 months (around 05/07/2022).  Dequan Kindred G. [name], MD  [facility]. [place] office.

## 2021-11-05 ENCOUNTER — Ambulatory Visit: Payer: Self-pay | Admitting: General Surgery

## 2021-11-06 ENCOUNTER — Encounter: Payer: Self-pay | Admitting: Family Medicine

## 2021-11-06 ENCOUNTER — Ambulatory Visit (INDEPENDENT_AMBULATORY_CARE_PROVIDER_SITE_OTHER): Payer: Medicare Other | Admitting: Family Medicine

## 2021-11-06 VITALS — BP 118/78 | HR 71 | Temp 98.5°F | Resp 18 | Ht 67.0 in | Wt 163.0 lb

## 2021-11-06 DIAGNOSIS — E782 Mixed hyperlipidemia: Secondary | ICD-10-CM | POA: Diagnosis not present

## 2021-11-06 DIAGNOSIS — G4700 Insomnia, unspecified: Secondary | ICD-10-CM

## 2021-11-06 DIAGNOSIS — I2510 Atherosclerotic heart disease of native coronary artery without angina pectoris: Secondary | ICD-10-CM

## 2021-11-06 DIAGNOSIS — J988 Other specified respiratory disorders: Secondary | ICD-10-CM

## 2021-11-06 DIAGNOSIS — I10 Essential (primary) hypertension: Secondary | ICD-10-CM | POA: Diagnosis not present

## 2021-11-06 DIAGNOSIS — M159 Polyosteoarthritis, unspecified: Secondary | ICD-10-CM | POA: Diagnosis not present

## 2021-11-06 DIAGNOSIS — E1122 Type 2 diabetes mellitus with diabetic chronic kidney disease: Secondary | ICD-10-CM | POA: Diagnosis not present

## 2021-11-06 DIAGNOSIS — N1832 Chronic kidney disease, stage 3b: Secondary | ICD-10-CM | POA: Diagnosis not present

## 2021-11-06 MED ORDER — DOXYCYCLINE HYCLATE 100 MG PO TABS: 100.0000 mg | ORAL_TABLET | Freq: Two times a day (BID) | ORAL | 0 refills | Status: AC

## 2021-11-06 NOTE — Assessment & Plan Note (Signed)
He does not want to try Trazodone. Continue Zzzquil at bedtime as needed with caution. Good sleep hygiene.

## 2021-11-06 NOTE — Patient Instructions (Addendum)
A few things to remember from today's visit:  Stage 3b chronic kidney disease ([place])  Mixed hyperlipidemia  Essential hypertension  Generalized osteoarthritis of multiple sites  Respiratory tract infection - Plan: doxycycline (VIBRA-TABS) 100 MG tablet  Symptoms are most likely viral and could be aggravated by allergies. I do not think you need antibiotics at this time, I sent prescription in case symptoms get worse or you develop fever.  Continue Diclofenac with caution, try not to take it daily. Harle Battiest function has been stable and needs to be followed regularly.  If you need refills for medications you take chronically, please call your pharmacy. Do not use My Chart to request refills or for acute issues that need immediate attention. If you send a my chart message, it may take a few days to be addressed, specially if I am not in the office.  Please be sure medication list is accurate. If a new problem present, please set up appointment sooner than planned today.

## 2021-11-06 NOTE — Assessment & Plan Note (Addendum)
Problem has been otherwise stable. Cr went from 1.6 to 1.7 and e GFR stable at 41. He understands side effects of Diclofenac. Continue adequate hydration and low salt diet.

## 2021-11-06 NOTE — Assessment & Plan Note (Addendum)
BP adequately controlled. Continue Metoprolol succinate 50 mg daily, Losartan-HCTZ 50-12.5 mg daily,and Amlodipine 5 mg daily. Continue low salt diet.

## 2021-11-06 NOTE — Assessment & Plan Note (Signed)
LDL is at goal, 52 in 07/2021. Continue Atorvastatin 40 mg daily and low fat diet.

## 2021-11-06 NOTE — Assessment & Plan Note (Addendum)
Problem has been otherwise stable. According to pt, his endocrinologist increased Diclofenac dose because 50 mg was not helping with pain, specially shoulder pain. We discussed side effects of NSAID's (CV,renal,and GI) and he voices understanding,he want to continue taking medication.

## 2021-11-06 NOTE — Assessment & Plan Note (Addendum)
Last HgA1C 7.1, which I think it is appropriate for him. Continue following with endocrinologist. On Metformin and Farxiga. Last Cr in 07/2021 was 1.7.

## 2021-11-20 ENCOUNTER — Other Ambulatory Visit: Payer: Self-pay

## 2021-11-20 ENCOUNTER — Emergency Department (HOSPITAL_BASED_OUTPATIENT_CLINIC_OR_DEPARTMENT_OTHER): Payer: Medicare Other

## 2021-11-20 ENCOUNTER — Inpatient Hospital Stay (HOSPITAL_BASED_OUTPATIENT_CLINIC_OR_DEPARTMENT_OTHER)
Admission: EM | Admit: 2021-11-20 | Discharge: 2021-11-23 | DRG: 378 | Disposition: A | Discharge status: Home / Self Care | Payer: Medicare Other | Attending: Family Medicine | Admitting: Family Medicine

## 2021-11-20 ENCOUNTER — Emergency Department (HOSPITAL_BASED_OUTPATIENT_CLINIC_OR_DEPARTMENT_OTHER): Payer: Medicare Other | Admitting: Radiology

## 2021-11-20 ENCOUNTER — Encounter (HOSPITAL_COMMUNITY): Payer: Self-pay

## 2021-11-20 ENCOUNTER — Encounter (HOSPITAL_BASED_OUTPATIENT_CLINIC_OR_DEPARTMENT_OTHER): Payer: Self-pay

## 2021-11-20 DIAGNOSIS — R739 Hyperglycemia, unspecified: Secondary | ICD-10-CM | POA: Diagnosis not present

## 2021-11-20 DIAGNOSIS — I129 Hypertensive chronic kidney disease with stage 1 through stage 4 chronic kidney disease, or unspecified chronic kidney disease: Secondary | ICD-10-CM | POA: Diagnosis present

## 2021-11-20 DIAGNOSIS — Z7984 Long term (current) use of oral hypoglycemic drugs: Secondary | ICD-10-CM | POA: Diagnosis not present

## 2021-11-20 DIAGNOSIS — D72829 Elevated white blood cell count, unspecified: Secondary | ICD-10-CM | POA: Diagnosis not present

## 2021-11-20 DIAGNOSIS — R42 Dizziness and giddiness: Secondary | ICD-10-CM | POA: Diagnosis not present

## 2021-11-20 DIAGNOSIS — Z7982 Long term (current) use of aspirin: Secondary | ICD-10-CM | POA: Diagnosis not present

## 2021-11-20 DIAGNOSIS — E7800 Pure hypercholesterolemia, unspecified: Secondary | ICD-10-CM | POA: Diagnosis not present

## 2021-11-20 DIAGNOSIS — E1165 Type 2 diabetes mellitus with hyperglycemia: Secondary | ICD-10-CM | POA: Diagnosis not present

## 2021-11-20 DIAGNOSIS — I350 Nonrheumatic aortic (valve) stenosis: Secondary | ICD-10-CM | POA: Diagnosis present

## 2021-11-20 DIAGNOSIS — Z01818 Encounter for other preprocedural examination: Secondary | ICD-10-CM | POA: Diagnosis not present

## 2021-11-20 DIAGNOSIS — R55 Syncope and collapse: Secondary | ICD-10-CM | POA: Diagnosis present

## 2021-11-20 DIAGNOSIS — N183 Chronic kidney disease, stage 3 unspecified: Secondary | ICD-10-CM | POA: Diagnosis not present

## 2021-11-20 DIAGNOSIS — K2950 Unspecified chronic gastritis without bleeding: Secondary | ICD-10-CM | POA: Diagnosis not present

## 2021-11-20 DIAGNOSIS — R0602 Shortness of breath: Secondary | ICD-10-CM | POA: Diagnosis not present

## 2021-11-20 DIAGNOSIS — D62 Acute posthemorrhagic anemia: Secondary | ICD-10-CM

## 2021-11-20 DIAGNOSIS — G309 Alzheimer's disease, unspecified: Secondary | ICD-10-CM

## 2021-11-20 DIAGNOSIS — R609 Edema, unspecified: Secondary | ICD-10-CM | POA: Diagnosis present

## 2021-11-20 DIAGNOSIS — Z8249 Family history of ischemic heart disease and other diseases of the circulatory system: Secondary | ICD-10-CM | POA: Diagnosis not present

## 2021-11-20 DIAGNOSIS — Z833 Family history of diabetes mellitus: Secondary | ICD-10-CM

## 2021-11-20 DIAGNOSIS — R413 Other amnesia: Secondary | ICD-10-CM

## 2021-11-20 DIAGNOSIS — I2510 Atherosclerotic heart disease of native coronary artery without angina pectoris: Secondary | ICD-10-CM | POA: Diagnosis present

## 2021-11-20 DIAGNOSIS — I2489 Other forms of acute ischemic heart disease: Secondary | ICD-10-CM | POA: Diagnosis not present

## 2021-11-20 DIAGNOSIS — Z87442 Personal history of urinary calculi: Secondary | ICD-10-CM

## 2021-11-20 DIAGNOSIS — Z87891 Personal history of nicotine dependence: Secondary | ICD-10-CM | POA: Diagnosis not present

## 2021-11-20 DIAGNOSIS — Z8711 Personal history of peptic ulcer disease: Secondary | ICD-10-CM | POA: Diagnosis not present

## 2021-11-20 DIAGNOSIS — R195 Other fecal abnormalities: Secondary | ICD-10-CM | POA: Diagnosis not present

## 2021-11-20 DIAGNOSIS — I491 Atrial premature depolarization: Secondary | ICD-10-CM | POA: Diagnosis not present

## 2021-11-20 DIAGNOSIS — I700 Atherosclerosis of aorta: Secondary | ICD-10-CM | POA: Diagnosis not present

## 2021-11-20 DIAGNOSIS — I10 Essential (primary) hypertension: Secondary | ICD-10-CM | POA: Diagnosis not present

## 2021-11-20 DIAGNOSIS — K269 Duodenal ulcer, unspecified as acute or chronic, without hemorrhage or perforation: Secondary | ICD-10-CM | POA: Diagnosis not present

## 2021-11-20 DIAGNOSIS — Z791 Long term (current) use of non-steroidal anti-inflammatories (NSAID): Secondary | ICD-10-CM | POA: Diagnosis not present

## 2021-11-20 DIAGNOSIS — Z953 Presence of xenogenic heart valve: Secondary | ICD-10-CM

## 2021-11-20 DIAGNOSIS — R61 Generalized hyperhidrosis: Secondary | ICD-10-CM | POA: Diagnosis not present

## 2021-11-20 DIAGNOSIS — R0600 Dyspnea, unspecified: Secondary | ICD-10-CM

## 2021-11-20 DIAGNOSIS — K922 Gastrointestinal hemorrhage, unspecified: Secondary | ICD-10-CM | POA: Diagnosis present

## 2021-11-20 DIAGNOSIS — N1832 Chronic kidney disease, stage 3b: Secondary | ICD-10-CM | POA: Diagnosis present

## 2021-11-20 DIAGNOSIS — G8929 Other chronic pain: Secondary | ICD-10-CM | POA: Diagnosis present

## 2021-11-20 DIAGNOSIS — K4090 Unilateral inguinal hernia, without obstruction or gangrene, not specified as recurrent: Secondary | ICD-10-CM | POA: Diagnosis present

## 2021-11-20 DIAGNOSIS — N1831 Chronic kidney disease, stage 3a: Secondary | ICD-10-CM | POA: Diagnosis present

## 2021-11-20 DIAGNOSIS — K264 Chronic or unspecified duodenal ulcer with hemorrhage: Principal | ICD-10-CM

## 2021-11-20 DIAGNOSIS — K2980 Duodenitis without bleeding: Secondary | ICD-10-CM | POA: Diagnosis present

## 2021-11-20 DIAGNOSIS — E1122 Type 2 diabetes mellitus with diabetic chronic kidney disease: Secondary | ICD-10-CM | POA: Diagnosis not present

## 2021-11-20 DIAGNOSIS — R410 Disorientation, unspecified: Secondary | ICD-10-CM

## 2021-11-20 DIAGNOSIS — D631 Anemia in chronic kidney disease: Secondary | ICD-10-CM | POA: Diagnosis not present

## 2021-11-20 DIAGNOSIS — M1990 Unspecified osteoarthritis, unspecified site: Secondary | ICD-10-CM | POA: Diagnosis present

## 2021-11-20 DIAGNOSIS — E1129 Type 2 diabetes mellitus with other diabetic kidney complication: Secondary | ICD-10-CM | POA: Diagnosis present

## 2021-11-20 DIAGNOSIS — Z79899 Other long term (current) drug therapy: Secondary | ICD-10-CM

## 2021-11-20 DIAGNOSIS — Z8546 Personal history of malignant neoplasm of prostate: Secondary | ICD-10-CM | POA: Diagnosis not present

## 2021-11-20 DIAGNOSIS — Z7901 Long term (current) use of anticoagulants: Secondary | ICD-10-CM

## 2021-11-20 LAB — URINALYSIS, ROUTINE W REFLEX MICROSCOPIC
Bilirubin Urine: NEGATIVE
Glucose, UA: >1000 mg/dL — AB
Hgb urine dipstick: NEGATIVE
Leukocytes,Ua: NEGATIVE
Nitrite: NEGATIVE
Protein, ur: NEGATIVE mg/dL
Specific Gravity, Urine: 1.021 (ref 1.005–1.030)
pH: 5 (ref 5.0–8.0)

## 2021-11-20 LAB — CBC
HCT: 24.1 % — ABNORMAL LOW (ref 39.0–52.0)
Hemoglobin: 7.8 g/dL — ABNORMAL LOW (ref 13.0–17.0)
MCH: 32.8 pg (ref 26.0–34.0)
MCHC: 32.4 g/dL (ref 30.0–36.0)
MCV: 101.3 fL — ABNORMAL HIGH (ref 80.0–100.0)
Platelets: 265 10*3/uL (ref 150–400)
RBC: 2.38 MIL/uL — ABNORMAL LOW (ref 4.22–5.81)
RDW: 14.6 % (ref 11.5–15.5)
WBC: 11.1 10*3/uL — ABNORMAL HIGH (ref 4.0–10.5)
nRBC: 0 % (ref 0.0–0.2)

## 2021-11-20 LAB — BASIC METABOLIC PANEL
Anion gap: 14 (ref 5–15)
BUN: 44 mg/dL — ABNORMAL HIGH (ref 8–23)
CO2: 20 mmol/L — ABNORMAL LOW (ref 22–32)
Calcium: 9.5 mg/dL (ref 8.9–10.3)
Chloride: 99 mmol/L (ref 98–111)
Creatinine, Ser: 1.77 mg/dL — ABNORMAL HIGH (ref 0.61–1.24)
GFR, Estimated: 39 mL/min — ABNORMAL LOW (ref >60)
Glucose, Bld: 381 mg/dL — ABNORMAL HIGH (ref 70–99)
Potassium: 4.5 mmol/L (ref 3.5–5.1)
Sodium: 133 mmol/L — ABNORMAL LOW (ref 135–145)

## 2021-11-20 LAB — TROPONIN I (HIGH SENSITIVITY)
Troponin I (High Sensitivity): 27 ng/L — ABNORMAL HIGH (ref <18)
Troponin I (High Sensitivity): 25 ng/L — ABNORMAL HIGH (ref <18)

## 2021-11-20 LAB — CBG MONITORING, ED: Glucose-Capillary: 344 mg/dL — ABNORMAL HIGH (ref 70–99)

## 2021-11-20 MED ORDER — PANTOPRAZOLE SODIUM 40 MG IV SOLR
40.0000 mg | Freq: Once | INTRAVENOUS | Status: AC
  Administered 2021-11-21: 40 mg via INTRAVENOUS
  Filled 2021-11-20: qty 10

## 2021-11-20 MED ORDER — SODIUM CHLORIDE 0.9 % IV BOLUS
1000.0000 mL | Freq: Once | INTRAVENOUS | Status: AC
  Administered 2021-11-20: 1000 mL via INTRAVENOUS

## 2021-11-20 MED ORDER — PANTOPRAZOLE INFUSION (NEW) - SIMPLE MED
8.0000 mg/h | INTRAVENOUS | Status: AC
  Administered 2021-11-21 – 2021-11-23 (×6): 8 mg/h via INTRAVENOUS
  Filled 2021-11-20: qty 80
  Filled 2021-11-20: qty 100
  Filled 2021-11-20: qty 80
  Filled 2021-11-20 (×2): qty 100
  Filled 2021-11-20 (×2): qty 80

## 2021-11-20 MED ORDER — PANTOPRAZOLE SODIUM 40 MG IV SOLR
40.0000 mg | Freq: Once | INTRAVENOUS | Status: AC
  Administered 2021-11-20: 40 mg via INTRAVENOUS
  Filled 2021-11-20: qty 10

## 2021-11-20 NOTE — Progress Notes (Addendum)
Patient interview completed over the phone as he was hospitalized during appointment for an ulcer.  COVID Vaccine Completed: yes   Date of COVID positive in last 90 days: no  PCP - Betty [name], MD Cardiologist - Lyman Bishop, MD  Cardiac clearance by Christen Bame 09/14/21 in Epic  Chest x-ray - 11/20/21 Epic EKG - 04/01/21 Epic Stress Test - 04/18/19 Epic ECHO - 07/01/21 Epic Cardiac Cath - 05/08/20 Epic Pacemaker/ICD device last checked: n/a Spinal Cord Stimulator: n/a  Bowel Prep - no  Sleep Study - n/a CPAP -   Fasting Blood Sugar - 80-180 Checks Blood Sugar 1 times a day  Blood Thinner Instructions: Aspirin Instructions: no more ASA, post hospital  Last Dose:  Activity level: Can go up a flight of stairs and perform activities of daily living without stopping and without symptoms of chest pain or shortness of breath.    Anesthesia review: HTN, aortic valve replacement, CKD, DM2, recent hospitalization for ulcer  Patient denies shortness of breath, fever, cough and chest pain at PAT appointment  Patient verbalized understanding of instructions that were given to them at the PAT appointment. Patient was also instructed that they will need to review over the PAT instructions again at home before surgery.

## 2021-11-20 NOTE — ED Notes (Signed)
Pt became near syncopal in imaging area. Appears pale but is alert. EKG repeated and given to EDP

## 2021-11-20 NOTE — Patient Instructions (Signed)
SURGICAL WAITING ROOM VISITATION Patients having surgery or a procedure may have no more than 2 support people in the waiting area - these visitors may rotate.   Children under the age of 31 must have an adult with them who is not the patient. If the patient needs to stay at the hospital during part of their recovery, the visitor guidelines for inpatient rooms apply. Pre-op nurse will coordinate an appropriate time for 1 support person to accompany patient in pre-op.  This support person may not rotate.    Please refer to the [facility] website for the visitor guidelines for Inpatients (after your surgery is over and you are in a regular room).    Your procedure is scheduled on: 12/03/21   Report to [facility] Main Entrance    Report to admitting at 6:45 AM   Call this number if you have problems the morning of surgery [phone]   Do not eat food :After Midnight.   After Midnight you may have the following liquids until 6:00 AM DAY OF SURGERY  Water Non-Citrus Juices (without pulp, NO RED) Carbonated Beverages Black Coffee (NO MILK/CREAM OR CREAMERS, sugar ok)  Clear Tea (NO MILK/CREAM OR CREAMERS, sugar ok) regular and decaf                             Plain Jell-O (NO RED)                                           Fruit ices (not with fruit pulp, NO RED)                                     Popsicles (NO RED)                                                               Sports drinks like Gatorade (NO RED)         If you have questions, please contact your surgeon's office.   FOLLOW BOWEL PREP AND ANY ADDITIONAL PRE OP INSTRUCTIONS YOU RECEIVED FROM YOUR SURGEON'S OFFICE!!!     Oral Hygiene is also important to reduce your risk of infection.                                    Remember - BRUSH YOUR TEETH THE MORNING OF SURGERY WITH YOUR REGULAR TOOTHPASTE   Do NOT smoke after Midnight   Take these medicines the morning of surgery with A SIP OF WATER: Tylenol,  Amlodipine, Atorvastatin, Metoprolol   DO NOT TAKE ANY ORAL DIABETIC MEDICATIONS DAY OF YOUR SURGERY  How to Manage Your Diabetes Before and After Surgery  Why is it important to control my blood sugar before and after surgery? Improving blood sugar levels before and after surgery helps healing and can limit problems. A way of improving blood sugar control is eating a healthy diet by:  Eating less sugar and carbohydrates  Increasing activity/exercise  Talking with  your doctor about reaching your blood sugar goals High blood sugars (greater than 180 mg/dL) can raise your risk of infections and slow your recovery, so you will need to focus on controlling your diabetes during the weeks before surgery. Make sure that the doctor who takes care of your diabetes knows about your planned surgery including the date and location.  How do I manage my blood sugar before surgery? Check your blood sugar at least 4 times a day, starting 2 days before surgery, to make sure that the level is not too high or low. Check your blood sugar the morning of your surgery when you wake up and every 2 hours until you get to the Short Stay unit. If your blood sugar is less than 70 mg/dL, you will need to treat for low blood sugar: Do not take insulin. Treat a low blood sugar (less than 70 mg/dL) with  cup of clear juice (cranberry or apple), 4 glucose tablets, OR glucose gel. Recheck blood sugar in 15 minutes after treatment (to make sure it is greater than 70 mg/dL). If your blood sugar is not greater than 70 mg/dL on recheck, call [phone] for further instructions. Report your blood sugar to the short stay nurse when you get to Short Stay.  If you are admitted to the hospital after surgery: Your blood sugar will be checked by the staff and you will probably be given insulin after surgery (instead of oral diabetes medicines) to make sure you have good blood sugar levels. The goal for blood sugar control after  surgery is 80-180 mg/dL.   WHAT DO I DO ABOUT MY DIABETES MEDICATION?  Do not take oral diabetes medicines (pills) the morning of surgery.  Hold Farxiga for 3 days before surgery  THE DAY BEFORE SURGERY, take Metformin as prescribed     THE MORNING OF SURGERY, do not take Metformin.   DO NOT TAKE THE FOLLOWING 7 DAYS PRIOR TO SURGERY: Ozempic, Wegovy, Rybelsus (Semaglutide), Byetta (exenatide), Bydureon (exenatide ER), Victoza, Saxenda (liraglutide), or Trulicity (dulaglutide) Mounjaro (Tirzepatide) Adlyxin (Lixisenatide), Polyethylene Glycol Loxenatide.  Reviewed and Endorsed by [facility] Patient Education Committee, August 2015   Bring CPAP mask and tubing day of surgery.                              You may not have any metal on your body including jewelry, and body piercing             Do not wear lotions, powders, cologne, or deodorant              Men may shave face and neck.   Do not bring valuables to the hospital. [place].   Contacts, dentures or bridgework may not be worn into surgery.  DO NOT [place]. PHARMACY WILL DISPENSE MEDICATIONS LISTED ON YOUR MEDICATION LIST TO YOU DURING YOUR ADMISSION [place]!    Patients discharged on the day of surgery will not be allowed to drive home.  Someone NEEDS to stay with you for the first 24 hours after anesthesia.   Special Instructions: Bring a copy of your healthcare power of attorney and living will documents the day of surgery if you haven't scanned them before.  Please read over the following fact sheets you were given: IF [place] [phone] Schneiders   If you received a COVID test during your pre-op visit  it is requested that you wear a mask when out in public, stay away from anyone that may not be feeling well and notify your surgeon if you develop symptoms.  If you test positive for Covid or have been in contact with anyone that has tested positive in the last 10 days please notify you surgeon.     [place] - Preparing for Surgery Before surgery, you can play an important role.  Because skin is not sterile, your skin needs to be as free of germs as possible.  You can reduce the number of germs on your skin by washing with CHG (chlorahexidine gluconate) soap before surgery.  CHG is an antiseptic cleaner which kills germs and bonds with the skin to continue killing germs even after washing. Please DO NOT use if you have an allergy to CHG or antibacterial soaps.  If your skin becomes reddened/irritated stop using the CHG and inform your nurse when you arrive at Short Stay. Do not shave (including legs and underarms) for at least 48 hours prior to the first CHG shower.  You may shave your face/neck.  Please follow these instructions carefully:  1.  Shower with CHG Soap the night before surgery and the  morning of surgery.  2.  If you choose to wash your hair, wash your hair first as usual with your normal  shampoo.  3.  After you shampoo, rinse your hair and body thoroughly to remove the shampoo.                             4.  Use CHG as you would any other liquid soap.  You can apply chg directly to the skin and wash.  Gently with a scrungie or clean washcloth.  5.  Apply the CHG Soap to your body ONLY FROM THE NECK DOWN.   Do   not use on face/ open                           Wound or open sores. Avoid contact with eyes, ears mouth and   genitals (private parts).                       Wash face,  Genitals (private parts) with your normal soap.             6.  Wash thoroughly, paying special attention to the area where your    surgery  will be performed.  7.  Thoroughly rinse your body with warm water from the neck down.  8.  DO NOT shower/wash with your normal soap after using and rinsing off the CHG Soap.                9.  Pat yourself dry with a  clean towel.            10.  Wear clean pajamas.            11.  Place clean sheets on your bed the night of your first shower and do not  sleep with pets. Day of Surgery : Do not apply any lotions/deodorants the morning of surgery.  Please wear clean clothes to the  hospital/surgery center.  FAILURE TO FOLLOW THESE INSTRUCTIONS MAY RESULT IN THE CANCELLATION OF YOUR SURGERY  PATIENT SIGNATURE_________________________________  NURSE SIGNATURE__________________________________  ________________________________________________________________________

## 2021-11-20 NOTE — ED Provider Notes (Signed)
[facility] EMERGENCY DEPT Provider Note   CSN: [id number] Arrival date & time: 11/20/21  2037     History  Chief Complaint  Patient presents with   Shortness of Breath    Daryl Webb is a 76 y.o. male.   Shortness of Breath Patient presents with shortness of breath.  Began this afternoon.  Also developed some confusion/forgetfulness.  That began today.  Some confusion about what was happening and per daughter it sounded if he was having difficulty making new memories.  Feeling somewhat better now.  Has had black stool earlier this week.  Has had some difficulty with his blood sugar going high due to recent medicine changes.  Has had previous duodenal ulcer.  Has had a TAVR but is not on anticoagulation.    Past Medical History:  Diagnosis Date   Anemia    Arthritis    CKD (chronic kidney disease)    Constipation due to pain medication    needs stool softner while on pain medication   Diabetes mellitus    Duodenal ulcer 08/13/2015   2014  - Dr Kaplan/GI   H/O: osteoarthritis    History of kidney stones    History of prostate cancer    History of urethral stricture    Hypercholesterolemia    Hypertension    Prostate cancer ([place])    prostate   S/P TAVR (transcatheter aortic valve replacement) 08/12/2020   s/p TAVR with a 29 mm Edwards Sapien 3 THV via the TF approach by [name] and [name]   Severe aortic stenosis    Spinal stenosis    Ulcer duodenal hemorrhage     Home Medications Prior to Admission medications   Medication Sig Start Date End Date Taking? Authorizing Provider  ACCU-CHEK SMARTVIEW test strip Use to test blood sugars once daily 07/19/19   [provider]  acetaminophen (TYLENOL) 325 MG tablet Take 650 mg by mouth every 6 (six) hours as needed for moderate pain.    [provider]  amLODipine (NORVASC) 5 MG tablet Take 1 tablet (5 mg total) by mouth daily. 08/26/21   Hilty, Nadean Corwin, MD  amoxicillin  (AMOXIL) 500 MG capsule Take 4 capsules (2,000 mg total) by mouth See admin instructions. Take 2000 mg 1 hour prior to dental procedures 07/01/21   Eileen Stanford, PA-C  Apoaequorin (PREVAGEN PO) Take 1 capsule by mouth daily.    [provider]  aspirin 81 MG tablet Take 81 mg by mouth daily.    [provider]  aspirin-acetaminophen-caffeine (EXCEDRIN MIGRAINE) [phone] MG tablet Take 2 tablets by mouth every 6 (six) hours as needed for headache.    [provider]  atorvastatin (LIPITOR) 40 MG tablet Take 1 tablet (40 mg total) by mouth daily. 08/26/21   Hilty, Nadean Corwin, MD  calcium carbonate (TUMS - DOSED IN MG ELEMENTAL CALCIUM) 500 MG chewable tablet Chew 2 tablets by mouth daily as needed for indigestion or heartburn.    [provider]  Coenzyme Q10 (COQ10) 100 MG CAPS Take 100 mg by mouth daily.    [provider]  cyanocobalamin (VITAMIN B12) 1000 MCG/ML injection Inject 1,000 mcg into the muscle every 30 (thirty) days.    [provider]  dapagliflozin propanediol (FARXIGA) 10 MG TABS tablet Take 10 mg by mouth daily.    [provider]  diclofenac (VOLTAREN) 75 MG EC tablet Take 75 mg by mouth 2 (two) times daily as needed for moderate pain.  [provider]  diphenhydrAMINE HCl, Sleep, (ZZZQUIL) 25 MG CAPS Take 25 mg by mouth at bedtime.    [provider]  losartan-hydrochlorothiazide (HYZAAR) 50-12.5 MG tablet TAKE 1 TABLET DAILY 08/31/21   Hilty, Nadean Corwin, MD  metFORMIN (GLUCOPHAGE) 500 MG tablet Take 500 mg by mouth 2 (two) times daily. 05/01/19   [provider]  metoprolol succinate (TOPROL-XL) 50 MG 24 hr tablet TAKE 1 TABLET DAILY 04/30/21   Hilty, Nadean Corwin, MD  nystatin ointment (MYCOSTATIN) Apply 1 application topically 3 (three) times daily. Patient taking differently: Apply 1 application  topically 3 (three) times daily as needed (jock itch). 08/01/20   Gaye Pollack, MD       Allergies    Patient has no known allergies.    Review of Systems   Review of Systems  Respiratory:  Positive for shortness of breath.     Physical Exam Updated Vital Signs BP 120/71   Pulse 68   Temp 97.6 F (36.4 C)   Resp (!) 21   Ht '5\' 7"'$  (1.702 m)   Wt 73.9 kg   SpO2 99%   BMI 25.52 kg/m  Physical Exam Vitals and nursing note reviewed.  HENT:     Head: Normocephalic.  Cardiovascular:     Rate and Rhythm: Regular rhythm.  Chest:     Chest wall: No tenderness.  Musculoskeletal:     Right lower leg: No edema.     Left lower leg: No edema.  Skin:    General: Skin is warm.     Capillary Refill: Capillary refill takes less than 2 seconds.     Coloration: Skin is pale.  Neurological:     Mental Status: He is alert.     Comments: Awake and pleasant.  Able answer questions.  Per daughter has had some mild memory issues before this episode.     ED Results / Procedures / Treatments   Labs (all labs ordered are listed, but only abnormal results are displayed) Labs Reviewed  BASIC METABOLIC PANEL - Abnormal; Notable for the following components:      Result Value   Sodium 133 (*)    CO2 20 (*)    Glucose, Bld 381 (*)    BUN 44 (*)    Creatinine, Ser 1.77 (*)    GFR, Estimated 39 (*)    All other components within normal limits  CBC - Abnormal; Notable for the following components:   WBC 11.1 (*)    RBC 2.38 (*)    Hemoglobin 7.8 (*)    HCT 24.1 (*)    MCV 101.3 (*)    All other components within normal limits  CBG MONITORING, ED - Abnormal; Notable for the following components:   Glucose-Capillary 344 (*)    All other components within normal limits  TROPONIN I (HIGH SENSITIVITY) - Abnormal; Notable for the following components:   Troponin I (High Sensitivity) 27 (*)    All other components within normal limits  TROPONIN I (HIGH SENSITIVITY) - Abnormal; Notable for the following components:   Troponin I (High Sensitivity) 25 (*)    All other components  within normal limits  URINALYSIS, ROUTINE W REFLEX MICROSCOPIC    EKG EKG Interpretation  Date/Time:  Friday November 20 2021 20:45:02 EDT Ventricular Rate:  83 PR Interval:  112 QRS Duration: 104 QT Interval:  372 QTC Calculation: 437 R Axis:   5 Text Interpretation: Normal sinus rhythm Nonspecific ST and T wave abnormality Abnormal ECG  When compared with ECG of 13-Aug-2020 06:13, Left anterior fascicular block is no longer Present Nonspecific T wave abnormality now evident in Inferior leads Confirmed by Davonna Belling [phone]) on 11/20/2021 9:41:18 PM  Radiology CT Head Wo Contrast  Result Date: 11/20/2021 CLINICAL DATA:  Memory loss EXAM: CT HEAD WITHOUT CONTRAST TECHNIQUE: Contiguous axial images were obtained from the base of the skull through the vertex without intravenous contrast. RADIATION DOSE REDUCTION: This exam was performed according to the departmental dose-optimization program which includes automated exposure control, adjustment of the mA and/or kV according to patient size and/or use of iterative reconstruction technique. COMPARISON:  None Available. FINDINGS: Brain: No evidence of acute infarction, hemorrhage, hydrocephalus, extra-axial collection or mass lesion/mass effect. Mild atrophic changes are noted. Vascular: No hyperdense vessel or unexpected calcification. Skull: Normal. Negative for fracture or focal lesion. Sinuses/Orbits: Opacification of the left maxillary antrum is noted of uncertain chronicity. Mucosal thickening is noted in the left frontal sinus as well. Other: None. IMPRESSION: Mild atrophic changes without acute intracranial abnormality. Left-sided sinus disease as described. Electronically Signed   By: Inez Catalina M.D.   On: 11/20/2021 21:55   DG Chest 2 View  Result Date: 11/20/2021 CLINICAL DATA:  Shortness of breath. EXAM: CHEST - 2 VIEW COMPARISON:  August 08, 2020 FINDINGS: The heart size and mediastinal contours are within normal limits. There is  moderate severity calcification of the aortic arch. An artificial aortic valve is also noted. Both lungs are clear. Multilevel degenerative changes are seen throughout the thoracic spine. Postoperative changes are noted within the visualized portion of the mid lumbar spine. IMPRESSION: No active cardiopulmonary disease. Electronically Signed   By: Virgina Norfolk M.D.   On: 11/20/2021 21:20    Procedures Procedures    Medications Ordered in ED Medications  pantoprazole (PROTONIX) injection 40 mg (40 mg Intravenous Given 11/20/21 2208)  sodium chloride 0.9 % bolus 1,000 mL (0 mLs Intravenous Stopped 11/20/21 2309)    ED Course/ Medical Decision Making/ A&P                           Medical Decision Making Amount and/or Complexity of Data Reviewed Labs: ordered. Radiology: ordered.  Risk Prescription drug management.   Patient presents with a few different complaints.  Has some shortness of breath.  Fatigue.  Dizziness.  Near syncopal episode and imaging.  However also has had some memory issues.  States he felt as though his head got weird and then had more difficulty remembering and making memories.  That has improved somewhat now.  Has had some mild memory issues before.  Reviewing notes it appears if he has had a previous duodenal ulcer.  Had some black stools and blood in the stool earlier this week.  Found to have anemia with a hemoglobin of 7.8.  This is below his baseline.  Sugar also somewhat elevated.  Does not appear to be in DKA however.  Troponin also mildly elevated but I think this is most likely secondary to the anemia.  Will give IV Protonix.  Will get head CT due to the memory issues.  However will require admission to the hospital due to the anemia.  Head CT reviewed and reassuring.  Reviewed notes of previous GI bleed with duodenal ulcer.  Glucose elevated and I think likely not in DKA.  Will check urinalysis and will give fluid and if continues to be high will give  insulin.  Will require admission  to the hospital.  I think his likely underlying memory issues been worsened by the events going on but may require further work-up. Discussed with Dr. Bridgett Larsson for admission  CRITICAL CARE Performed by: Davonna Belling Total critical care time: 30 minutes Critical care time was exclusive of separately billable procedures and treating other patients. Critical care was necessary to treat or prevent imminent or life-threatening deterioration. Critical care was time spent personally by me on the following activities: development of treatment plan with patient and/or surrogate as well as nursing, discussions with consultants, evaluation of patient's response to treatment, examination of patient, obtaining history from patient or surrogate, ordering and performing treatments and interventions, ordering and review of laboratory studies, ordering and review of radiographic studies, pulse oximetry and re-evaluation of patient's condition.        Final Clinical Impression(s) / ED Diagnoses Final diagnoses:  Gastrointestinal hemorrhage, unspecified gastrointestinal hemorrhage type  Acute blood loss anemia  Memory loss    Rx / DC Orders ED Discharge Orders     None         Davonna Belling, MD 11/20/21 2321

## 2021-11-20 NOTE — Progress Notes (Signed)
  [facility] will assume care on arrival to accepting facility. Until arrival, care as per EDP. However, [facility] available 24/7 for questions and assistance.   Nursing staff please page [facility] Admits and Consults ([phone]) as soon as the patient arrives to the hospital.  Tyris Eliot, DO [facility]  

## 2021-11-20 NOTE — ED Triage Notes (Signed)
Patient here POV from Home.  Endorses SOB that began at 1500. States he does not recall Episodes and has had some forgetfulness. Also endorses recent fluctuations in BG due to Changes in Diabetes Medications  Currently Lightheaded and has some GERD Sensation in Epigastric Area.   NAD noted during Triage. A&Ox4. GCS 15. BIB Wheelchair.

## 2021-11-21 ENCOUNTER — Observation Stay (HOSPITAL_COMMUNITY): Payer: Medicare Other | Admitting: Anesthesiology

## 2021-11-21 ENCOUNTER — Encounter (HOSPITAL_COMMUNITY): Payer: Self-pay | Admitting: Internal Medicine

## 2021-11-21 ENCOUNTER — Encounter (HOSPITAL_COMMUNITY)
Admission: EM | Disposition: A | Discharge status: Home / Self Care | Payer: Self-pay | Source: Home / Self Care | Attending: Family Medicine

## 2021-11-21 DIAGNOSIS — R195 Other fecal abnormalities: Secondary | ICD-10-CM | POA: Diagnosis not present

## 2021-11-21 DIAGNOSIS — E1122 Type 2 diabetes mellitus with diabetic chronic kidney disease: Secondary | ICD-10-CM | POA: Diagnosis present

## 2021-11-21 DIAGNOSIS — N183 Chronic kidney disease, stage 3 unspecified: Secondary | ICD-10-CM

## 2021-11-21 DIAGNOSIS — G309 Alzheimer's disease, unspecified: Secondary | ICD-10-CM

## 2021-11-21 DIAGNOSIS — D631 Anemia in chronic kidney disease: Secondary | ICD-10-CM

## 2021-11-21 DIAGNOSIS — Z8711 Personal history of peptic ulcer disease: Secondary | ICD-10-CM

## 2021-11-21 DIAGNOSIS — R410 Disorientation, unspecified: Secondary | ICD-10-CM

## 2021-11-21 DIAGNOSIS — Z87891 Personal history of nicotine dependence: Secondary | ICD-10-CM | POA: Diagnosis not present

## 2021-11-21 DIAGNOSIS — Z7982 Long term (current) use of aspirin: Secondary | ICD-10-CM | POA: Diagnosis not present

## 2021-11-21 DIAGNOSIS — I2510 Atherosclerotic heart disease of native coronary artery without angina pectoris: Secondary | ICD-10-CM | POA: Diagnosis present

## 2021-11-21 DIAGNOSIS — G8929 Other chronic pain: Secondary | ICD-10-CM | POA: Diagnosis present

## 2021-11-21 DIAGNOSIS — K269 Duodenal ulcer, unspecified as acute or chronic, without hemorrhage or perforation: Secondary | ICD-10-CM

## 2021-11-21 DIAGNOSIS — Z01818 Encounter for other preprocedural examination: Secondary | ICD-10-CM | POA: Diagnosis present

## 2021-11-21 DIAGNOSIS — Z953 Presence of xenogenic heart valve: Secondary | ICD-10-CM | POA: Diagnosis not present

## 2021-11-21 DIAGNOSIS — D62 Acute posthemorrhagic anemia: Secondary | ICD-10-CM

## 2021-11-21 DIAGNOSIS — K922 Gastrointestinal hemorrhage, unspecified: Secondary | ICD-10-CM

## 2021-11-21 DIAGNOSIS — R609 Edema, unspecified: Secondary | ICD-10-CM | POA: Diagnosis present

## 2021-11-21 DIAGNOSIS — I10 Essential (primary) hypertension: Secondary | ICD-10-CM | POA: Diagnosis not present

## 2021-11-21 DIAGNOSIS — E1165 Type 2 diabetes mellitus with hyperglycemia: Secondary | ICD-10-CM | POA: Diagnosis present

## 2021-11-21 DIAGNOSIS — K264 Chronic or unspecified duodenal ulcer with hemorrhage: Secondary | ICD-10-CM | POA: Diagnosis present

## 2021-11-21 DIAGNOSIS — Z791 Long term (current) use of non-steroidal anti-inflammatories (NSAID): Secondary | ICD-10-CM

## 2021-11-21 DIAGNOSIS — Z7984 Long term (current) use of oral hypoglycemic drugs: Secondary | ICD-10-CM | POA: Diagnosis not present

## 2021-11-21 DIAGNOSIS — N1832 Chronic kidney disease, stage 3b: Secondary | ICD-10-CM | POA: Diagnosis not present

## 2021-11-21 DIAGNOSIS — Z833 Family history of diabetes mellitus: Secondary | ICD-10-CM | POA: Diagnosis not present

## 2021-11-21 DIAGNOSIS — I350 Nonrheumatic aortic (valve) stenosis: Secondary | ICD-10-CM | POA: Diagnosis present

## 2021-11-21 DIAGNOSIS — I129 Hypertensive chronic kidney disease with stage 1 through stage 4 chronic kidney disease, or unspecified chronic kidney disease: Secondary | ICD-10-CM | POA: Diagnosis present

## 2021-11-21 DIAGNOSIS — M1990 Unspecified osteoarthritis, unspecified site: Secondary | ICD-10-CM | POA: Diagnosis present

## 2021-11-21 DIAGNOSIS — R0602 Shortness of breath: Secondary | ICD-10-CM | POA: Diagnosis present

## 2021-11-21 DIAGNOSIS — K2980 Duodenitis without bleeding: Secondary | ICD-10-CM | POA: Diagnosis present

## 2021-11-21 DIAGNOSIS — K2950 Unspecified chronic gastritis without bleeding: Secondary | ICD-10-CM | POA: Diagnosis not present

## 2021-11-21 DIAGNOSIS — D72829 Elevated white blood cell count, unspecified: Secondary | ICD-10-CM | POA: Diagnosis present

## 2021-11-21 DIAGNOSIS — N1831 Chronic kidney disease, stage 3a: Secondary | ICD-10-CM | POA: Diagnosis present

## 2021-11-21 DIAGNOSIS — K4090 Unilateral inguinal hernia, without obstruction or gangrene, not specified as recurrent: Secondary | ICD-10-CM | POA: Diagnosis present

## 2021-11-21 DIAGNOSIS — Z8249 Family history of ischemic heart disease and other diseases of the circulatory system: Secondary | ICD-10-CM | POA: Diagnosis not present

## 2021-11-21 DIAGNOSIS — E7800 Pure hypercholesterolemia, unspecified: Secondary | ICD-10-CM | POA: Diagnosis present

## 2021-11-21 DIAGNOSIS — R55 Syncope and collapse: Secondary | ICD-10-CM | POA: Diagnosis present

## 2021-11-21 DIAGNOSIS — I2489 Other forms of acute ischemic heart disease: Secondary | ICD-10-CM | POA: Diagnosis present

## 2021-11-21 DIAGNOSIS — Z87442 Personal history of urinary calculi: Secondary | ICD-10-CM | POA: Diagnosis not present

## 2021-11-21 DIAGNOSIS — Z8546 Personal history of malignant neoplasm of prostate: Secondary | ICD-10-CM | POA: Diagnosis not present

## 2021-11-21 HISTORY — PX: ESOPHAGOGASTRODUODENOSCOPY: SHX5428

## 2021-11-21 HISTORY — PX: BIOPSY: SHX5522

## 2021-11-21 LAB — PREPARE RBC (CROSSMATCH)

## 2021-11-21 LAB — BASIC METABOLIC PANEL
Anion gap: 7 (ref 5–15)
BUN: 56 mg/dL — ABNORMAL HIGH (ref 8–23)
CO2: 21 mmol/L — ABNORMAL LOW (ref 22–32)
Calcium: 8.6 mg/dL — ABNORMAL LOW (ref 8.9–10.3)
Chloride: 108 mmol/L (ref 98–111)
Creatinine, Ser: 1.84 mg/dL — ABNORMAL HIGH (ref 0.61–1.24)
GFR, Estimated: 38 mL/min — ABNORMAL LOW (ref >60)
Glucose, Bld: 264 mg/dL — ABNORMAL HIGH (ref 70–99)
Potassium: 4.3 mmol/L (ref 3.5–5.1)
Sodium: 136 mmol/L (ref 135–145)

## 2021-11-21 LAB — HEMOGLOBIN AND HEMATOCRIT, BLOOD
HCT: 25.6 % — ABNORMAL LOW (ref 39.0–52.0)
Hemoglobin: 8.3 g/dL — ABNORMAL LOW (ref 13.0–17.0)

## 2021-11-21 LAB — IRON AND TIBC
Iron: 22 ug/dL — ABNORMAL LOW (ref 45–182)
Saturation Ratios: 7 % — ABNORMAL LOW (ref 17.9–39.5)
TIBC: 327 ug/dL (ref 250–450)
UIBC: 305 ug/dL

## 2021-11-21 LAB — GLUCOSE, CAPILLARY
Glucose-Capillary: 232 mg/dL — ABNORMAL HIGH (ref 70–99)
Glucose-Capillary: 201 mg/dL — ABNORMAL HIGH (ref 70–99)
Glucose-Capillary: 177 mg/dL — ABNORMAL HIGH (ref 70–99)
Glucose-Capillary: 237 mg/dL — ABNORMAL HIGH (ref 70–99)

## 2021-11-21 LAB — POCT I-STAT, CHEM 8
BUN: 41 mg/dL — ABNORMAL HIGH (ref 8–23)
Calcium, Ion: 1.19 mmol/L (ref 1.15–1.40)
Chloride: 109 mmol/L (ref 98–111)
Creatinine, Ser: 1.8 mg/dL — ABNORMAL HIGH (ref 0.61–1.24)
Glucose, Bld: 189 mg/dL — ABNORMAL HIGH (ref 70–99)
HCT: 21 % — ABNORMAL LOW (ref 39.0–52.0)
Hemoglobin: 7.1 g/dL — ABNORMAL LOW (ref 13.0–17.0)
Potassium: 4.1 mmol/L (ref 3.5–5.1)
Sodium: 139 mmol/L (ref 135–145)
TCO2: 18 mmol/L — ABNORMAL LOW (ref 22–32)

## 2021-11-21 LAB — CBC
HCT: 19.2 % — ABNORMAL LOW (ref 39.0–52.0)
Hemoglobin: 6.1 g/dL — CL (ref 13.0–17.0)
MCH: 33 pg (ref 26.0–34.0)
MCHC: 31.8 g/dL (ref 30.0–36.0)
MCV: 103.8 fL — ABNORMAL HIGH (ref 80.0–100.0)
Platelets: 191 10*3/uL (ref 150–400)
RBC: 1.85 MIL/uL — ABNORMAL LOW (ref 4.22–5.81)
RDW: 14.7 % (ref 11.5–15.5)
WBC: 8.4 10*3/uL (ref 4.0–10.5)
nRBC: 0 % (ref 0.0–0.2)

## 2021-11-21 LAB — HEMOGLOBIN A1C
Hgb A1c MFr Bld: 8.1 % — ABNORMAL HIGH (ref 4.8–5.6)
Mean Plasma Glucose: 185.77 mg/dL

## 2021-11-21 LAB — FERRITIN: Ferritin: 13 ng/mL — ABNORMAL LOW (ref 24–336)

## 2021-11-21 LAB — VITAMIN B12: Vitamin B-12: 246 pg/mL (ref 180–914)

## 2021-11-21 SURGERY — EGD (ESOPHAGOGASTRODUODENOSCOPY)
Anesthesia: Monitor Anesthesia Care

## 2021-11-21 MED ORDER — SODIUM CHLORIDE 0.9% IV SOLUTION: Freq: Once | INTRAVENOUS | Status: AC

## 2021-11-21 MED ORDER — SODIUM CHLORIDE 0.9 % IV SOLN: INTRAVENOUS | Status: AC

## 2021-11-21 MED ORDER — METOCLOPRAMIDE HCL 5 MG/ML IJ SOLN
10.0000 mg | Freq: Once | INTRAMUSCULAR | Status: AC
  Administered 2021-11-21: 10 mg via INTRAVENOUS
  Filled 2021-11-21: qty 2

## 2021-11-21 MED ORDER — INSULIN ASPART 100 UNIT/ML IJ SOLN
0.0000 [IU] | INTRAMUSCULAR | Status: DC
  Administered 2021-11-21: 3 [IU] via SUBCUTANEOUS
  Administered 2021-11-22: 1 [IU] via SUBCUTANEOUS
  Administered 2021-11-22: 2 [IU] via SUBCUTANEOUS
  Administered 2021-11-22: 5 [IU] via SUBCUTANEOUS
  Administered 2021-11-22: 3 [IU] via SUBCUTANEOUS
  Administered 2021-11-22: 2 [IU] via SUBCUTANEOUS
  Administered 2021-11-23: 1 [IU] via SUBCUTANEOUS
  Administered 2021-11-23: 2 [IU] via SUBCUTANEOUS

## 2021-11-21 MED ORDER — SODIUM CHLORIDE 0.9% IV SOLUTION: Freq: Once | INTRAVENOUS | Status: DC

## 2021-11-21 MED ORDER — LACTATED RINGERS IV SOLN
INTRAVENOUS | Status: AC | PRN
  Administered 2021-11-21: 1000 mL via INTRAVENOUS

## 2021-11-21 MED ORDER — PROPOFOL 500 MG/50ML IV EMUL
INTRAVENOUS | Status: DC | PRN
  Administered 2021-11-21: 150 ug/kg/min via INTRAVENOUS

## 2021-11-21 NOTE — H&P (Signed)
History and Physical    Daryl Webb ZDG:[id number] DOB: 1945-07-06 DOA: 11/20/2021  PCP: [name], [name], MD  Patient coming from: [facility] ED  Chief Complaint: Shortness of breath  HPI: Daryl Webb is a 76 y.o. male with medical history significant of CKD stage III, type 2 diabetes, hypertension, hyperlipidemia, prostate cancer, severe aortic stenosis status post TAVR, CAD, history of duodenal ulcer on EGD done in 2014 presented to the ED with complaints of melena, shortness of breath, confusion/forgetfulness, and elevated blood glucose readings.  Vital signs stable.  Labs showing WBC 11.1, hemoglobin 7.8 (previously in the 12-14 range in July 2022), glucose 381, bicarb 20, anion gap 14, UA with only trace ketones and no signs of infection, creatinine 1.7 (previously ranging between 1.3-1.6), high-sensitivity troponin 27> 25.  Chest x-ray and CT head negative for acute finding. While in the ED, he had an episode of near syncope. Patient was given IV Protonix bolus and started on continuous infusion.  He was also given 1 L normal saline bolus.  Patient states he had a few episodes of dark stools earlier this week but then his stool went back to looking normal.  Yesterday while with family he had an episode of near syncope.  He felt lightheaded when getting up and felt like he was going to pass out but did not lose consciousness.  Denies hematemesis.  Reports slight epigastric abdominal discomfort and severe acid reflux.  He takes diclofenac for chronic pain.  Denies alcohol use.  He takes aspirin but not on any other blood thinners.  He reports shortness of breath but denies chest pain.  He is concerned that his blood sugars have been high at home >300.  He takes metformin for diabetes but not on insulin.  Patient is also concerned that about being more forgetful lately.  He recently lost his iPad but then found it, however, was not able to tell family where he found it.  Per daughter, he  has had progressive decline of his memory over time but worse for the past 2 weeks.  Review of Systems:  Review of Systems  All other systems reviewed and are negative.   Past Medical History:  Diagnosis Date   Anemia    Arthritis    CKD (chronic kidney disease)    Constipation due to pain medication    needs stool softner while on pain medication   Diabetes mellitus    Duodenal ulcer 08/13/2015   2014  - Dr Kaplan/GI   H/O: osteoarthritis    History of kidney stones    History of prostate cancer    History of urethral stricture    Hypercholesterolemia    Hypertension    Prostate cancer ([place])    prostate   S/P TAVR (transcatheter aortic valve replacement) 08/12/2020   s/p TAVR with a 29 mm Edwards Sapien 3 THV via the TF approach by [name] and [name]   Severe aortic stenosis    Spinal stenosis    Ulcer duodenal hemorrhage     Past Surgical History:  Procedure Laterality Date   ARTHROPLASTY  2004   right knee [name] SURGERY     CARDIAC CATHETERIZATION     CARPAL TUNNEL RELEASE     right hand,wrist [name]   CYSTOSCOPY N/A 09/20/2012   Procedure: Consuela Mimes;  Surgeon: [name], MD;  Location: [place] [facility];  Service: Urology;  Laterality: N/A;   JOINT REPLACEMENT Right    KNEE  ARTHROSCOPY Right    LUMBAR LAMINECTOMY  02/08/2013   PROSTATECTOMY     radical   RIGHT/LEFT HEART CATH AND CORONARY ANGIOGRAPHY N/A 05/08/2020   Procedure: RIGHT/LEFT HEART CATH AND CORONARY ANGIOGRAPHY;  Surgeon: [name], MD;  Location: [place] [facility];  Service: Cardiovascular;  Laterality: N/A;   TEE WITHOUT CARDIOVERSION N/A 08/12/2020   Procedure: TRANSESOPHAGEAL ECHOCARDIOGRAM (TEE);  Surgeon: [name], MD;  Location: [place] [facility];  Service: Open Heart Surgery;  Laterality: N/A;   TRANSCATHETER AORTIC VALVE REPLACEMENT, TRANSFEMORAL N/A 08/12/2020   Procedure: TRANSCATHETER AORTIC VALVE REPLACEMENT, TRANSFEMORAL;  Surgeon: [name],  MD;  Location: [place] [facility];  Service: Open Heart Surgery;  Laterality: N/A;   VASECTOMY  1985     reports that he quit smoking about 41 years ago. His smoking use included cigarettes. He has a 30.00 pack-year smoking history. He has never used smokeless tobacco. He reports that he does not drink alcohol and does not use drugs.  No Known Allergies  Family History  Problem Relation Age of Onset   Heart disease Mother    Heart failure Mother    Heart disease Father    Diabetes Father     Prior to Admission medications   Medication Sig Start Date End Date Taking? Authorizing Provider  ACCU-CHEK SMARTVIEW test strip Use to test blood sugars once daily 07/19/19   [provider]  acetaminophen (TYLENOL) 325 MG tablet Take 650 mg by mouth every 6 (six) hours as needed for moderate pain.    [provider]  amLODipine (NORVASC) 5 MG tablet Take 1 tablet (5 mg total) by mouth daily. 08/26/21   Hilty, Nadean Corwin, MD  amoxicillin (AMOXIL) 500 MG capsule Take 4 capsules (2,000 mg total) by mouth See admin instructions. Take 2000 mg 1 hour prior to dental procedures 07/01/21   Eileen Stanford, PA-C  Apoaequorin (PREVAGEN PO) Take 1 capsule by mouth daily.    [provider]  aspirin 81 MG tablet Take 81 mg by mouth daily.    [provider]  aspirin-acetaminophen-caffeine (EXCEDRIN MIGRAINE) [phone] MG tablet Take 2 tablets by mouth every 6 (six) hours as needed for headache.    [provider]  atorvastatin (LIPITOR) 40 MG tablet Take 1 tablet (40 mg total) by mouth daily. 08/26/21   Hilty, Nadean Corwin, MD  calcium carbonate (TUMS - DOSED IN MG ELEMENTAL CALCIUM) 500 MG chewable tablet Chew 2 tablets by mouth daily as needed for indigestion or heartburn.    [provider]  Coenzyme Q10 (COQ10) 100 MG CAPS Take 100 mg by mouth daily.    [provider]  cyanocobalamin (VITAMIN B12) 1000 MCG/ML injection Inject 1,000 mcg into the  muscle every 30 (thirty) days.    [provider]  dapagliflozin propanediol (FARXIGA) 10 MG TABS tablet Take 10 mg by mouth daily.    [provider]  diclofenac (VOLTAREN) 75 MG EC tablet Take 75 mg by mouth 2 (two) times daily as needed for moderate pain.    [provider]  diphenhydrAMINE HCl, Sleep, (ZZZQUIL) 25 MG CAPS Take 25 mg by mouth at bedtime.    [provider]  losartan-hydrochlorothiazide (HYZAAR) 50-12.5 MG tablet TAKE 1 TABLET DAILY 08/31/21   Hilty, Nadean Corwin, MD  metFORMIN (GLUCOPHAGE) 500 MG tablet Take 500 mg by mouth 2 (two) times daily. 05/01/19   [provider]  metoprolol succinate (TOPROL-XL) 50 MG 24 hr tablet TAKE 1 TABLET DAILY 04/30/21  [name], MD  nystatin ointment (MYCOSTATIN) Apply 1 application topically 3 (three) times daily. Patient taking differently: Apply 1 application  topically 3 (three) times daily as needed (jock itch). 08/01/20   Gaye Pollack, MD    Physical Exam: Vitals:   11/20/21 2130 11/21/21 0110 11/21/21 0130 11/21/21 0233  BP: 120/71 111/61 113/62 128/61  Pulse: 68 68 66 76  Resp: (!) '21 16 16 18  '$ Temp:  97.8 F (36.6 C)  97.8 F (36.6 C)  TempSrc:  Oral  Oral  SpO2: 99% 100% 97% 99%  Weight:      Height:        Physical Exam Vitals reviewed.  Constitutional:      General: He is not in acute distress. HENT:     Head: Normocephalic and atraumatic.  Eyes:     Extraocular Movements: Extraocular movements intact.  Cardiovascular:     Rate and Rhythm: Normal rate and regular rhythm.     Pulses: Normal pulses.  Pulmonary:     Effort: Pulmonary effort is normal. No respiratory distress.     Breath sounds: Normal breath sounds. No wheezing or rales.  Abdominal:     General: Bowel sounds are normal. There is no distension.     Palpations: Abdomen is soft.     Tenderness: There is no abdominal tenderness. There is no guarding or rebound.  Musculoskeletal:        General: No  swelling or tenderness.     Cervical back: Normal range of motion.  Skin:    General: Skin is warm and dry.  Neurological:     General: No focal deficit present.     Mental Status: He is alert and oriented to person, place, and time.     Labs on Admission: I have personally reviewed following labs and imaging studies  CBC: Recent Labs  Lab 11/20/21 2047  WBC 11.1*  HGB 7.8*  HCT 24.1*  MCV 101.3*  PLT 694   Basic Metabolic Panel: Recent Labs  Lab 11/20/21 2047  NA 133*  K 4.5  CL 99  CO2 20*  GLUCOSE 381*  BUN 44*  CREATININE 1.77*  CALCIUM 9.5   GFR: Estimated Creatinine Clearance: 33.2 mL/min (A) (by C-G formula based on SCr of 1.77 mg/dL (H)). Liver Function Tests: No results for input(s): "AST", "ALT", "ALKPHOS", "BILITOT", "PROT", "ALBUMIN" in the last 168 hours. No results for input(s): "LIPASE", "AMYLASE" in the last 168 hours. No results for input(s): "AMMONIA" in the last 168 hours. Coagulation Profile: No results for input(s): "INR", "PROTIME" in the last 168 hours. Cardiac Enzymes: No results for input(s): "CKTOTAL", "CKMB", "CKMBINDEX", "TROPONINI" in the last 168 hours. BNP (last 3 results) No results for input(s): "PROBNP" in the last 8760 hours. HbA1C: No results for input(s): "HGBA1C" in the last 72 hours. CBG: Recent Labs  Lab 11/20/21 2052  GLUCAP 344*   Lipid Profile: No results for input(s): "CHOL", "HDL", "LDLCALC", "TRIG", "CHOLHDL", "LDLDIRECT" in the last 72 hours. Thyroid Function Tests: No results for input(s): "TSH", "T4TOTAL", "FREET4", "T3FREE", "THYROIDAB" in the last 72 hours. Anemia Panel: No results for input(s): "VITAMINB12", "FOLATE", "FERRITIN", "TIBC", "IRON", "RETICCTPCT" in the last 72 hours. Urine analysis:    Component Value Date/Time   COLORURINE COLORLESS (A) 11/20/2021 2314   APPEARANCEUR CLEAR 11/20/2021 2314   LABSPEC 1.021 11/20/2021 2314   PHURINE 5.0 11/20/2021 2314   GLUCOSEU >1,000 (A) 11/20/2021  2314   HGBUR NEGATIVE 11/20/2021 2314   BILIRUBINUR NEGATIVE  11/20/2021 2314   BILIRUBINUR NEG 07/04/2019 1048   KETONESUR TRACE (A) 11/20/2021 2314   PROTEINUR NEGATIVE 11/20/2021 2314   UROBILINOGEN 0.2 07/04/2019 1048   NITRITE NEGATIVE 11/20/2021 2314   LEUKOCYTESUR NEGATIVE 11/20/2021 2314    Radiological Exams on Admission: CT Head Wo Contrast  Result Date: 11/20/2021 CLINICAL DATA:  Memory loss EXAM: CT HEAD WITHOUT CONTRAST TECHNIQUE: Contiguous axial images were obtained from the base of the skull through the vertex without intravenous contrast. RADIATION DOSE REDUCTION: This exam was performed according to the departmental dose-optimization program which includes automated exposure control, adjustment of the mA and/or kV according to patient size and/or use of iterative reconstruction technique. COMPARISON:  None Available. FINDINGS: Brain: No evidence of acute infarction, hemorrhage, hydrocephalus, extra-axial collection or mass lesion/mass effect. Mild atrophic changes are noted. Vascular: No hyperdense vessel or unexpected calcification. Skull: Normal. Negative for fracture or focal lesion. Sinuses/Orbits: Opacification of the left maxillary antrum is noted of uncertain chronicity. Mucosal thickening is noted in the left frontal sinus as well. Other: None. IMPRESSION: Mild atrophic changes without acute intracranial abnormality. Left-sided sinus disease as described. Electronically Signed   By: Inez Catalina M.D.   On: 11/20/2021 21:55   DG Chest 2 View  Result Date: 11/20/2021 CLINICAL DATA:  Shortness of breath. EXAM: CHEST - 2 VIEW COMPARISON:  August 08, 2020 FINDINGS: The heart size and mediastinal contours are within normal limits. There is moderate severity calcification of the aortic arch. An artificial aortic valve is also noted. Both lungs are clear. Multilevel degenerative changes are seen throughout the thoracic spine. Postoperative changes are noted within the visualized  portion of the mid lumbar spine. IMPRESSION: No active cardiopulmonary disease. Electronically Signed   By: Virgina Norfolk M.D.   On: 11/20/2021 21:20    EKG: Independently reviewed.  Sinus rhythm, borderline T wave abnormalities.  No significant change compared to prior tracings.  Assessment and Plan  Acute GI bleed Symptomatic acute blood loss anemia Patient presenting with complaints of melena.  Hemoglobin 7.8 on initial labs, previously in the 12-14 range in July 2022.  He just had a large bloody bowel movement this morning and per RN it was dark stool mixed with bright blood.  Remains hemodynamically stable.  EGD done in 2014 showing duodenal ulcer.  He takes diclofenac for chronic pain.  Takes aspirin 81 mg daily but not on anticoagulation.  Denies alcohol use.  -Hold aspirin and diclofenac -Continue Protonix infusion -Continue IV fluids -Keep n.p.o. -Type and screen -Stat CBC and 1 unit PRBCs ordered.  Patient has given verbal consent for blood transfusion.  Monitor H&H closely and continue to transfuse if Hgb <8 given CAD. -I have sent a message to Dr. Rush Landmark with [facility] requesting consultation in the morning.  Dyspnea Likely due to anemia.  No tachypnea or hypoxia.  Chest x-ray negative for acute finding. -Continue to monitor  Confusion/forgetfulness Daughter reporting progressive memory decline.  CT head negative for acute finding and no focal neurodeficit on exam.  UA without signs of infection.  Does not appear confused at present and answering questions appropriately. -Monitor closely and ensure outpatient follow-up  Type 2 diabetes with hyperglycemia He is not on insulin.  Last A1c 7.6 in December 2022.  Glucose elevated to 300s but labs not strongly suggestive of DKA.  Bicarb only mildly low with normal anion gap and trace ketones on UA.  Patient received 1 L fluid bolus in the ED. -Stat repeat BMP -Repeat A1c -Sensitive  sliding scale insulin every 4 hours as  patient is currently n.p.o. -Hold oral hypoglycemic agents at this time  Mild leukocytosis Likely reactive.  No infectious signs or symptoms. -Repeat CBC  CKD stage III Creatinine 1.7, previously ranging between 1.3-1.6. -Continue to monitor  Hypertension Stable. -Hold antihypertensives at this time given GI bleed and episode of near syncope in the ED  CAD Troponin mildly elevated and stable, not consistent with ACS.  He is not endorsing chest pain.  Mild troponin elevation likely due to demand ischemia in the setting of blood loss. -Hold aspirin given acute GI bleed -Hold beta-blocker at this time to avoid hypotension -Continue statin after pharmacy med rec is done and when no longer NPO.  Abnormal CT finding CT head showing opacification of the left maxillary antrum of uncertain chronicity. Mucosal thickening is noted in the left frontal sinus as well.   -No indication for treatment at this time as patient is not endorsing any sinus pain/pressure, headaches, or purulent nasal discharge.  Continue to monitor.  DVT prophylaxis: SCDs Code Status: Full Code (discussed with the patient) Family Communication: Daughter at bedside and patient's wife was on speaker phone. Consults called: GI Level of care: Telemetry bed Admission status: It is my clinical opinion that referral for OBSERVATION is reasonable and necessary in this patient based on the above information provided. The aforementioned taken together are felt to place the patient at high risk for further clinical deterioration. However, it is anticipated that the patient may be medically stable for discharge from the hospital within 24 to 48 hours.   Shela Leff MD [facility]  If 7PM-7AM, please contact night-coverage [email or web address]  11/21/2021, 5:57 AM

## 2021-11-21 NOTE — Progress Notes (Signed)
Pt had a large bright red BM. Waiting for MD to see, sent msg.

## 2021-11-21 NOTE — Op Note (Signed)
[facility] Patient Name: Daryl Webb Procedure Date: 11/21/2021 MRN: [id number] Attending MD: Estill Cotta. Daryl Webb , MD Date of Birth: 1945-12-28 CSN: [id number] Age: 76 Admit Type: Inpatient Procedure:                Upper GI endoscopy Indications:              Acute post hemorrhagic anemia, Melena Providers:                Carlyn Reichert, RN, Luan Moore, Technician,                            Brien Mates, RNFA, Estill Cotta. Daryl Carrow, MD Referring MD:             [facility] Medicines:                Monitored Anesthesia Care Complications:            No immediate complications. Estimated Blood Loss:     Estimated blood loss was minimal. Procedure:                Pre-Anesthesia Assessment:                           - Prior to the procedure, a History and Physical                            was performed, and patient medications and                            allergies were reviewed. The patient's tolerance of                            previous anesthesia was also reviewed. The risks                            and benefits of the procedure and the sedation                            options and risks were discussed with the patient.                            All questions were answered, and informed consent                            was obtained. Prior Anticoagulants: The patient has                            taken no previous anticoagulant or antiplatelet                            agents. ASA Grade Assessment: III - A patient with                            severe systemic disease. After reviewing the risks  and benefits, the patient was deemed in                            satisfactory condition to undergo the procedure.                           After obtaining informed consent, the endoscope was                            passed under direct vision. Throughout the                            procedure, the patient's blood  pressure, pulse, and                            oxygen saturations were monitored continuously. The                            [id number]-[id number] ([id number]) Olympus endoscope was introduced                            through the mouth, and advanced to the second part                            of duodenum. The upper GI endoscopy was                            accomplished without difficulty. The patient                            tolerated the procedure well. Scope In: Scope Out: Findings:      The larynx was normal.      The esophagus was normal.      The entire examined stomach was normal. Biopsies were taken with a cold       forceps for histology (antrum and body).      One non-bleeding cratered duodenal ulcer with pigmented material was       found in the duodenal bulb/sweep. The lesion was about 10-12 mm in       largest dimension with heaped edges and adjacent edema/deformity. Tiny       black spot in center, also small flat red spot. No fresh or old blood in       UGI tract. Scope passage trauma caused scant ooze from friable ulcer       edge.      Multiple additional smaller, shallower ulcers from the bulb to the 2nd       portion.      The patient's bleeding has stopped, VS normal with PPI. Therefore no       endoscopic intervention taken on ulcer that might preciptate bleeding in       a difficult location with limited visualization. Impression:               - Normal larynx.                           - Normal esophagus.                           -  Normal stomach. Biopsied.                           - Non-bleeding duodenal ulcer with pigmented                            material. Moderate Sedation:      MAC sedation used Recommendation:           - Return patient to hospital ward for ongoing care.                           - Clear liquid diet.                           - CBC Q 8 hours x 3 after completing current PRBC                            transfusion.                            Continue protonix drip for 48 hrs, then further GI                            recs to follow. Procedure Code(s):        --- Professional ---                           [phone], Esophagogastroduodenoscopy, flexible,                            transoral; with biopsy, single or multiple Diagnosis Code(s):        --- Professional ---                           K26.9, Duodenal ulcer, unspecified as acute or                            chronic, without hemorrhage or perforation                           D62, Acute posthemorrhagic anemia                           K92.1, Melena (includes Hematochezia) CPT copyright 2019 [employer]. All rights reserved. The codes documented in this report are preliminary and upon coder review may  be revised to meet current compliance requirements. Elanna Bert L. Daryl Carrow, MD 11/21/2021 3:27:01 PM This report has been signed electronically. Number of Addenda: 0

## 2021-11-21 NOTE — Consult Note (Signed)
Consultation Note   Referring Provider: [facility] PCP: [name], [name], MD Primary Gastroenterologist: Previously Dr. [name] Reason for consultation: melena, anemia  Hospital Day: 2  Assessment / Plan  # 76 yo male with remote history of a duodenal ulcer dark stool / symptomatic anemia with decline in hgb from 13 in July 2022 to 6.1 in setting of ASA, diclofenac, possibly Excedrin. On exam he has liquid maroon blood in vault Hemodynamically stable Schedule for EGD which will be done today most likely.  The risks and benefits of EGD with possible biopsies were discussed with the patient who agrees to proceed.  Continue PPI infusion A unit of blood has already been ordered. . May need more than one unit as he seems to be actively bleeding  # CKD3  Baseline Cr ~ 1.5-1.6, currently at 1.84. GFR 38  # Right inguinal hernia, for surgery soon  See PMH for additional medical problems   HPI   Daryl Webb is a 76 y.o. male with a past medical history significant for  duodenal ulcer in 2014, CKD stage III, type 2 diabetes, hypertension, hyperlipidemia, prostate cancer, severe aortic stenosis status post TAVR, CAD, inguinal hernia.  See PMH for any additional medical problems.   Patient was last seen by [place] in 2014 at the time of an EGD which showed a duodenal ulcer  Interval History:  Patient presented to ED early this a.m. for evaluation of dark stool, shortness of breath, confusion.   ED Evaluation:     VSS Hemoglobin 7.8, down from 13 in July 2022, MCV 101.8, platelets 191 Glucose 381 BUN 44, creatinine 1.77 Troponin 27>> 25 Chest x-ray - no active cardiopulmonary disease  Today: Hgb 6.1  Mr Krantz takes daily baby asa and also has taken daily Diclofenac for years. Last week he had black stool on Monday and Tuesday. Following that he was constipated with no BMS. Today in hospital he and daughter tell me he has had  two BMs, each with a significant amount of bright red blood. He has no abdominal  pain, no N/V.    Previous GI Evaluation    Feb 2012 screening colonoscopy  --Hyperplastic polyp  May 2014 EGD --3-4 mm clean based DU Path - chronic duodenitis . No H.pylori  Recent Labs and Imaging CT Head Wo Contrast  Result Date: 11/20/2021 CLINICAL DATA:  Memory loss EXAM: CT HEAD WITHOUT CONTRAST TECHNIQUE: Contiguous axial images were obtained from the base of the skull through the vertex without intravenous contrast. RADIATION DOSE REDUCTION: This exam was performed according to the departmental dose-optimization program which includes automated exposure control, adjustment of the mA and/or kV according to patient size and/or use of iterative reconstruction technique. COMPARISON:  None Available. FINDINGS: Brain: No evidence of acute infarction, hemorrhage, hydrocephalus, extra-axial collection or mass lesion/mass effect. Mild atrophic changes are noted. Vascular: No hyperdense vessel or unexpected calcification. Skull: Normal. Negative for fracture or focal lesion. Sinuses/Orbits: Opacification of the left maxillary antrum is noted of uncertain chronicity. Mucosal thickening is noted in the left frontal sinus as well. Other: None. IMPRESSION: Mild atrophic changes without acute intracranial abnormality. Left-sided sinus disease as described. Electronically Signed   By: Inez Catalina M.D.   On: 11/20/2021  21:55   DG Chest 2 View  Result Date: 11/20/2021 CLINICAL DATA:  Shortness of breath. EXAM: CHEST - 2 VIEW COMPARISON:  August 08, 2020 FINDINGS: The heart size and mediastinal contours are within normal limits. There is moderate severity calcification of the aortic arch. An artificial aortic valve is also noted. Both lungs are clear. Multilevel degenerative changes are seen throughout the thoracic spine. Postoperative changes are noted within the visualized portion of the mid lumbar spine. IMPRESSION: No active  cardiopulmonary disease. Electronically Signed   By: Virgina Norfolk M.D.   On: 11/20/2021 21:20    Labs:  Recent Labs    11/20/21 2047 11/21/21 0640  WBC 11.1* 8.4  HGB 7.8* 6.1*  HCT 24.1* 19.2*  PLT 265 191   Recent Labs    11/20/21 2047 11/21/21 0640  NA 133* 136  K 4.5 4.3  CL 99 108  CO2 20* 21*  GLUCOSE 381* 264*  BUN 44* 56*  CREATININE 1.77* 1.84*  CALCIUM 9.5 8.6*   No results for input(s): "PROT", "ALBUMIN", "AST", "ALT", "ALKPHOS", "BILITOT", "BILIDIR", "IBILI" in the last 72 hours. No results for input(s): "HEPBSAG", "HCVAB", "HEPAIGM", "HEPBIGM" in the last 72 hours. No results for input(s): "LABPROT", "INR" in the last 72 hours.  Past Medical History:  Diagnosis Date   Anemia    Arthritis    CKD (chronic kidney disease)    Constipation due to pain medication    needs stool softner while on pain medication   Diabetes mellitus    Duodenal ulcer 08/13/2015   2014  - Dr Kaplan/GI   H/O: osteoarthritis    History of kidney stones    History of prostate cancer    History of urethral stricture    Hypercholesterolemia    Hypertension    Prostate cancer ([place])    prostate   S/P TAVR (transcatheter aortic valve replacement) 08/12/2020   s/p TAVR with a 29 mm Edwards Sapien 3 THV via the TF approach by [name] and [name]   Severe aortic stenosis    Spinal stenosis    Ulcer duodenal hemorrhage     Past Surgical History:  Procedure Laterality Date   ARTHROPLASTY  2004   right knee [name] SURGERY     CARDIAC CATHETERIZATION     CARPAL TUNNEL RELEASE     right hand,wrist [name]   CYSTOSCOPY N/A 09/20/2012   Procedure: Consuela Mimes;  Surgeon: [name], MD;  Location: [facility];  Service: Urology;  Laterality: N/A;   JOINT REPLACEMENT Right    KNEE ARTHROSCOPY Right    LUMBAR LAMINECTOMY  02/08/2013   PROSTATECTOMY     radical   RIGHT/LEFT HEART CATH AND CORONARY ANGIOGRAPHY N/A 05/08/2020   Procedure: RIGHT/LEFT HEART  CATH AND CORONARY ANGIOGRAPHY;  Surgeon: [name], MD;  Location: [facility];  Service: Cardiovascular;  Laterality: N/A;   TEE WITHOUT CARDIOVERSION N/A 08/12/2020   Procedure: TRANSESOPHAGEAL ECHOCARDIOGRAM (TEE);  Surgeon: [name], MD;  Location: [facility];  Service: Open Heart Surgery;  Laterality: N/A;   TRANSCATHETER AORTIC VALVE REPLACEMENT, TRANSFEMORAL N/A 08/12/2020   Procedure: TRANSCATHETER AORTIC VALVE REPLACEMENT, TRANSFEMORAL;  Surgeon: [name], MD;  Location: [facility];  Service: Open Heart Surgery;  Laterality: N/A;   VASECTOMY  1985    Family History  Problem Relation Age of Onset   Heart disease Mother    Heart failure Mother    Heart disease Father  Diabetes Father     Prior to Admission medications   Medication Sig Start Date End Date Taking? Authorizing Provider  acetaminophen (TYLENOL) 325 MG tablet Take 650 mg by mouth every 6 (six) hours as needed for moderate pain.   Yes [provider]  amLODipine (NORVASC) 5 MG tablet Take 1 tablet (5 mg total) by mouth daily. 08/26/21  Yes Hilty, Nadean Corwin, MD  amoxicillin (AMOXIL) 500 MG capsule Take 4 capsules (2,000 mg total) by mouth See admin instructions. Take 2000 mg 1 hour prior to dental procedures 07/01/21  Yes Eileen Stanford, PA-C  Apoaequorin (PREVAGEN PO) Take 1 capsule by mouth daily.   Yes [provider]  aspirin 81 MG tablet Take 81 mg by mouth daily.   Yes [provider]  aspirin-acetaminophen-caffeine (EXCEDRIN MIGRAINE) [phone] MG tablet Take 2 tablets by mouth every 6 (six) hours as needed for headache.   Yes [provider]  atorvastatin (LIPITOR) 40 MG tablet Take 1 tablet (40 mg total) by mouth daily. 08/26/21  Yes Hilty, Nadean Corwin, MD  calcium carbonate (TUMS - DOSED IN MG ELEMENTAL CALCIUM) 500 MG chewable tablet Chew 2 tablets by mouth daily as needed for indigestion or heartburn.   Yes [provider]   Coenzyme Q10 (COQ10) 100 MG CAPS Take 100 mg by mouth daily.   Yes [provider]  cyanocobalamin (VITAMIN B12) 1000 MCG/ML injection Inject 1,000 mcg into the muscle every 30 (thirty) days.   Yes [provider]  dapagliflozin propanediol (FARXIGA) 10 MG TABS tablet Take 10 mg by mouth daily.   Yes [provider]  diclofenac (VOLTAREN) 75 MG EC tablet Take 75 mg by mouth 2 (two) times daily as needed for moderate pain.   Yes [provider]  diphenhydrAMINE HCl, Sleep, (ZZZQUIL) 25 MG CAPS Take 25 mg by mouth at bedtime.   Yes [provider]  losartan-hydrochlorothiazide (HYZAAR) 50-12.5 MG tablet TAKE 1 TABLET DAILY Patient taking differently: Take 1 tablet by mouth daily. 08/31/21  Yes Hilty, Nadean Corwin, MD  metFORMIN (GLUCOPHAGE) 500 MG tablet Take 500 mg by mouth 2 (two) times daily. 05/01/19  Yes [provider]  metoprolol succinate (TOPROL-XL) 50 MG 24 hr tablet TAKE 1 TABLET DAILY Patient taking differently: Take 50 mg by mouth daily. 04/30/21  Yes Hilty, Nadean Corwin, MD  nystatin ointment (MYCOSTATIN) Apply 1 application topically 3 (three) times daily. Patient taking differently: Apply 1 application  topically 3 (three) times daily as needed (jock itch). 08/01/20  Yes Gaye Pollack, MD    Current Facility-Administered Medications  Medication Dose Route Frequency Provider Last Rate Last Admin   0.9 %  sodium chloride infusion (Manually program via Guardrails IV Fluids)   Intravenous Once Shela Leff, MD       0.9 %  sodium chloride infusion   Intravenous Continuous Shela Leff, MD 125 mL/hr at 11/21/21 0758 New Bag at 11/21/21 0758   insulin aspart (novoLOG) injection 0-9 Units  0-9 Units Subcutaneous Q4H Shela Leff, MD       pantoprozole (PROTONIX) 80 mg /NS 100 mL infusion  8 mg/hr Intravenous Continuous Shela Leff, MD 10 mL/hr at 11/21/21 0924 8 mg/hr at 11/21/21 0924    Allergies as of 11/20/2021    (No Known Allergies)    Social History   Socioeconomic History   Marital status: Married    Spouse name: Not on file   Number of children: 2   Years of education: 79  Highest education level: Bachelor's degree (e.g., BA, AB, BS)  Occupational History   Occupation: Retired  Tobacco Use   Smoking status: Former    Packs/day: 1.50    Years: 20.00    Total pack years: 30.00    Types: Cigarettes    Quit date: 02/09/1980    Years since quitting: 41.8   Smokeless tobacco: Never  Vaping Use   Vaping Use: Never used  Substance and Sexual Activity   Alcohol use: No   Drug use: No   Sexual activity: Not Currently  Other Topics Concern   Not on file  Social History Narrative   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    Lives with wife,    two children in [place].    Enjoys reading, boating, travelling to [place] in the winter with wife.   Social Determinants of Health   Financial Resource Strain: Low Risk  (12/19/2020)   Overall Financial Resource Strain (CARDIA)    Difficulty of Paying Living Expenses: Not hard at all  Food Insecurity: No Food Insecurity (11/21/2021)   Hunger Vital Sign    Worried About Running Out of Food in the Last Year: Never true    Ran Out of Food in the Last Year: Never true  Transportation Needs: No Transportation Needs (11/21/2021)   PRAPARE - Hydrologist (Medical): No    Lack of Transportation (Non-Medical): No  Physical Activity: Sufficiently Active (12/19/2020)   Exercise Vital Sign    Days of Exercise per Week: 3 days    Minutes of Exercise per Session: 60 min  Stress: No Stress Concern Present (12/19/2020)   Gans    Feeling of Stress : Not at all  Social Connections: Socially Isolated (12/19/2020)   Social Connection and Isolation Panel [NHANES]    Frequency of Communication with Friends and Family: Once a week    Frequency of Social  Gatherings with Friends and Family: Once a week    Attends Religious Services: Never    Marine scientist or Organizations: No    Attends Archivist Meetings: Never    Marital Status: Married  Human resources officer Violence: Not At Risk (11/21/2021)   Humiliation, Afraid, Rape, and Kick questionnaire    Fear of Current or Ex-Partner: No    Emotionally Abused: No    Physically Abused: No    Sexually Abused: No    Review of Systems: All systems reviewed and negative except where noted in HPI.  Physical Exam: Vital signs in last 24 hours: Temp:  [97.6 F (36.4 C)-97.8 F (36.6 C)] 97.8 F (36.6 C) (10/14 0646) Pulse Rate:  [66-85] 72 (10/14 0646) Resp:  [16-21] 18 (10/14 0646) BP: (111-128)/(61-71) 113/67 (10/14 0646) SpO2:  [97 %-100 %] 99 % (10/14 0646) Weight:  [73.9 kg] 73.9 kg (10/13 2043) Last BM Date : 11/20/21  General:  Alert male in NAD Psych:  Pleasant, cooperative. Normal mood and affect Eyes: Pupils equal Ears:  Normal auditory acuity Nose: No deformity, discharge or lesions Neck:  Supple, no masses felt Lungs:  Clear to auscultation.  Heart:  Regular rate, regular rhythm. No lower extremity edema Abdomen:  Soft, nondistended, nontender, active bowel sounds, no masses felt Rectal : Liquid, maroon blood in vault Msk: Symmetrical without gross deformities.  Neurologic:  Alert, oriented, grossly normal neurologically Skin:  Intact without significant lesions.    Intake/Output from previous day: 10/13 0701 - 10/14 0700 In:  1000.3 [I.V.:0.3; IV Piggyback:1000] Out: 1 [Urine:1] Intake/Output this shift:  No intake/output data recorded.    Principal Problem:   GI bleeding Active Problems:   Type 2 diabetes mellitus with renal manifestations (HCC)   Dyspnea   Acute blood loss anemia   Essential hypertension   CKD (chronic kidney disease), stage III ([place])   Confusion    Tye Savoy, NP-C @  11/21/2021, 9:39 AM

## 2021-11-21 NOTE — Transfer of Care (Signed)
Immediate Anesthesia Transfer of Care Note  Patient: Daryl Webb  Procedure(s) Performed: ESOPHAGOGASTRODUODENOSCOPY (EGD)  Patient Location: PACU  Anesthesia Type:MAC  Level of Consciousness: sedated and patient cooperative  Airway & Oxygen Therapy: Patient Spontanous Breathing and Patient connected to face mask oxygen  Post-op Assessment: Report given to RN and Post -op Vital signs reviewed and stable  Post vital signs: Reviewed and stable  Last Vitals:  Vitals Value Taken Time  BP    Temp    Pulse    Resp    SpO2      Last Pain:  Vitals:   11/21/21 1449  TempSrc: Temporal  PainSc:          Complications: No notable events documented.

## 2021-11-21 NOTE — Progress Notes (Signed)
Endoscopy has come to get pt and take to EGD. Two daughters in room and are aware.  Stating they will start the patient's second unit of blood.

## 2021-11-21 NOTE — Plan of Care (Signed)

## 2021-11-21 NOTE — Anesthesia Postprocedure Evaluation (Signed)
Anesthesia Post Note  Patient: Daryl Webb  Procedure(s) Performed: ESOPHAGOGASTRODUODENOSCOPY (EGD) BIOPSY     Patient location during evaluation: PACU Anesthesia Type: MAC Level of consciousness: awake and alert Pain management: pain level controlled Vital Signs Assessment: post-procedure vital signs reviewed and stable Respiratory status: spontaneous breathing, nonlabored ventilation and respiratory function stable Cardiovascular status: stable and blood pressure returned to baseline Anesthetic complications: no   No notable events documented.  Last Vitals:  Vitals:   11/21/21 1813 11/21/21 2026  BP: 123/65 126/71  Pulse: 71 72  Resp: 18 18  Temp: 36.6 C 36.6 C  SpO2: 98% 98%    Last Pain:  Vitals:   11/21/21 2026  TempSrc: Oral  PainSc:                  [name]

## 2021-11-21 NOTE — Care Plan (Signed)
This 76 years old male with PMH significant of CKD stage III, type 2 diabetes, hypertension, hyperlipidemia, prostate cancer, severe aortic stenosis status post TAVR, CAD, history of duodenal ulcer on EGD done in 2014 presented to the ED with complaints of melena, shortness of breath, confusion/forgetfulness, and elevated blood glucose readings.  Hemoglobin on arrival 7.8 and dropped to 6.1.  Patient is admitted for acute GI bleeding.  GI is consulted.  Patient is going to get 2 units of packed red blood cells.  Continue pantoprazole infusion.  Patient is going to get EGD today.  Patient was seen and examined at bedside he seems hemodynamically stable.

## 2021-11-21 NOTE — H&P (View-Only) (Signed)
Consultation Note   Referring Provider: [facility] PCP: [name], [name], MD Primary Gastroenterologist: Previously Dr. [name] Reason for consultation: melena, anemia  Hospital Day: 2  Assessment / Plan  # 76 yo male with remote history of a duodenal ulcer dark stool / symptomatic anemia with decline in hgb from 13 in July 2022 to 6.1 in setting of ASA, diclofenac, possibly Excedrin. On exam he has liquid maroon blood in vault Hemodynamically stable Schedule for EGD which will be done today most likely.  The risks and benefits of EGD with possible biopsies were discussed with the patient who agrees to proceed.  Continue PPI infusion A unit of blood has already been ordered. . May need more than one unit as he seems to be actively bleeding  # CKD3  Baseline Cr ~ 1.5-1.6, currently at 1.84. GFR 38  # Right inguinal hernia, for surgery soon  See PMH for additional medical problems   HPI   Daryl Webb is a 76 y.o. male with a past medical history significant for  duodenal ulcer in 2014, CKD stage III, type 2 diabetes, hypertension, hyperlipidemia, prostate cancer, severe aortic stenosis status post TAVR, CAD, inguinal hernia.  See PMH for any additional medical problems.   Patient was last seen by [place] in 2014 at the time of an EGD which showed a duodenal ulcer  Interval History:  Patient presented to ED early this a.m. for evaluation of dark stool, shortness of breath, confusion.   ED Evaluation:     VSS Hemoglobin 7.8, down from 13 in July 2022, MCV 101.8, platelets 191 Glucose 381 BUN 44, creatinine 1.77 Troponin 27>> 25 Chest x-ray - no active cardiopulmonary disease  Today: Hgb 6.1  Daryl Webb takes daily baby asa and also has taken daily Diclofenac for years. Last week he had black stool on Monday and Tuesday. Following that he was constipated with no BMS. Today in hospital he and daughter tell me he has had  two BMs, each with a significant amount of bright red blood. He has no abdominal  pain, no N/V.    Previous GI Evaluation    Feb 2012 screening colonoscopy  --Hyperplastic polyp  May 2014 EGD --3-4 mm clean based DU Path - chronic duodenitis . No H.pylori  Recent Labs and Imaging CT Head Wo Contrast  Result Date: 11/20/2021 CLINICAL DATA:  Memory loss EXAM: CT HEAD WITHOUT CONTRAST TECHNIQUE: Contiguous axial images were obtained from the base of the skull through the vertex without intravenous contrast. RADIATION DOSE REDUCTION: This exam was performed according to the departmental dose-optimization program which includes automated exposure control, adjustment of the mA and/or kV according to patient size and/or use of iterative reconstruction technique. COMPARISON:  None Available. FINDINGS: Brain: No evidence of acute infarction, hemorrhage, hydrocephalus, extra-axial collection or mass lesion/mass effect. Mild atrophic changes are noted. Vascular: No hyperdense vessel or unexpected calcification. Skull: Normal. Negative for fracture or focal lesion. Sinuses/Orbits: Opacification of the left maxillary antrum is noted of uncertain chronicity. Mucosal thickening is noted in the left frontal sinus as well. Other: None. IMPRESSION: Mild atrophic changes without acute intracranial abnormality. Left-sided sinus disease as described. Electronically Signed   By: Inez Catalina M.D.   On: 11/20/2021  21:55   DG Chest 2 View  Result Date: 11/20/2021 CLINICAL DATA:  Shortness of breath. EXAM: CHEST - 2 VIEW COMPARISON:  August 08, 2020 FINDINGS: The heart size and mediastinal contours are within normal limits. There is moderate severity calcification of the aortic arch. An artificial aortic valve is also noted. Both lungs are clear. Multilevel degenerative changes are seen throughout the thoracic spine. Postoperative changes are noted within the visualized portion of the mid lumbar spine. IMPRESSION: No active  cardiopulmonary disease. Electronically Signed   By: Virgina Norfolk M.D.   On: 11/20/2021 21:20    Labs:  Recent Labs    11/20/21 2047 11/21/21 0640  WBC 11.1* 8.4  HGB 7.8* 6.1*  HCT 24.1* 19.2*  PLT 265 191   Recent Labs    11/20/21 2047 11/21/21 0640  NA 133* 136  K 4.5 4.3  CL 99 108  CO2 20* 21*  GLUCOSE 381* 264*  BUN 44* 56*  CREATININE 1.77* 1.84*  CALCIUM 9.5 8.6*   No results for input(s): "PROT", "ALBUMIN", "AST", "ALT", "ALKPHOS", "BILITOT", "BILIDIR", "IBILI" in the last 72 hours. No results for input(s): "HEPBSAG", "HCVAB", "HEPAIGM", "HEPBIGM" in the last 72 hours. No results for input(s): "LABPROT", "INR" in the last 72 hours.  Past Medical History:  Diagnosis Date   Anemia    Arthritis    CKD (chronic kidney disease)    Constipation due to pain medication    needs stool softner while on pain medication   Diabetes mellitus    Duodenal ulcer 08/13/2015   2014  - Dr Kaplan/GI   H/O: osteoarthritis    History of kidney stones    History of prostate cancer    History of urethral stricture    Hypercholesterolemia    Hypertension    Prostate cancer ([place])    prostate   S/P TAVR (transcatheter aortic valve replacement) 08/12/2020   s/p TAVR with a 29 mm Edwards Sapien 3 THV via the TF approach by [name] and [name]   Severe aortic stenosis    Spinal stenosis    Ulcer duodenal hemorrhage     Past Surgical History:  Procedure Laterality Date   ARTHROPLASTY  2004   right knee [name] SURGERY     CARDIAC CATHETERIZATION     CARPAL TUNNEL RELEASE     right hand,wrist [name]   CYSTOSCOPY N/A 09/20/2012   Procedure: Consuela Mimes;  Surgeon: [name], MD;  Location: [facility];  Service: Urology;  Laterality: N/A;   JOINT REPLACEMENT Right    KNEE ARTHROSCOPY Right    LUMBAR LAMINECTOMY  02/08/2013   PROSTATECTOMY     radical   RIGHT/LEFT HEART CATH AND CORONARY ANGIOGRAPHY N/A 05/08/2020   Procedure: RIGHT/LEFT HEART  CATH AND CORONARY ANGIOGRAPHY;  Surgeon: [name], MD;  Location: [facility];  Service: Cardiovascular;  Laterality: N/A;   TEE WITHOUT CARDIOVERSION N/A 08/12/2020   Procedure: TRANSESOPHAGEAL ECHOCARDIOGRAM (TEE);  Surgeon: [name], MD;  Location: [place] [facility];  Service: Open Heart Surgery;  Laterality: N/A;   TRANSCATHETER AORTIC VALVE REPLACEMENT, TRANSFEMORAL N/A 08/12/2020   Procedure: TRANSCATHETER AORTIC VALVE REPLACEMENT, TRANSFEMORAL;  Surgeon: [name], MD;  Location: [place] [facility];  Service: Open Heart Surgery;  Laterality: N/A;   VASECTOMY  1985    Family History  Problem Relation Age of Onset   Heart disease Mother    Heart failure Mother    Heart disease Father  Diabetes Father     Prior to Admission medications   Medication Sig Start Date End Date Taking? Authorizing Provider  acetaminophen (TYLENOL) 325 MG tablet Take 650 mg by mouth every 6 (six) hours as needed for moderate pain.   Yes [provider]  amLODipine (NORVASC) 5 MG tablet Take 1 tablet (5 mg total) by mouth daily. 08/26/21  Yes Hilty, Nadean Corwin, MD  amoxicillin (AMOXIL) 500 MG capsule Take 4 capsules (2,000 mg total) by mouth See admin instructions. Take 2000 mg 1 hour prior to dental procedures 07/01/21  Yes Eileen Stanford, PA-C  Apoaequorin (PREVAGEN PO) Take 1 capsule by mouth daily.   Yes [provider]  aspirin 81 MG tablet Take 81 mg by mouth daily.   Yes [provider]  aspirin-acetaminophen-caffeine (EXCEDRIN MIGRAINE) [phone] MG tablet Take 2 tablets by mouth every 6 (six) hours as needed for headache.   Yes [provider]  atorvastatin (LIPITOR) 40 MG tablet Take 1 tablet (40 mg total) by mouth daily. 08/26/21  Yes Hilty, Nadean Corwin, MD  calcium carbonate (TUMS - DOSED IN MG ELEMENTAL CALCIUM) 500 MG chewable tablet Chew 2 tablets by mouth daily as needed for indigestion or heartburn.   Yes [provider]   Coenzyme Q10 (COQ10) 100 MG CAPS Take 100 mg by mouth daily.   Yes [provider]  cyanocobalamin (VITAMIN B12) 1000 MCG/ML injection Inject 1,000 mcg into the muscle every 30 (thirty) days.   Yes [provider]  dapagliflozin propanediol (FARXIGA) 10 MG TABS tablet Take 10 mg by mouth daily.   Yes [provider]  diclofenac (VOLTAREN) 75 MG EC tablet Take 75 mg by mouth 2 (two) times daily as needed for moderate pain.   Yes [provider]  diphenhydrAMINE HCl, Sleep, (ZZZQUIL) 25 MG CAPS Take 25 mg by mouth at bedtime.   Yes [provider]  losartan-hydrochlorothiazide (HYZAAR) 50-12.5 MG tablet TAKE 1 TABLET DAILY Patient taking differently: Take 1 tablet by mouth daily. 08/31/21  Yes Hilty, Nadean Corwin, MD  metFORMIN (GLUCOPHAGE) 500 MG tablet Take 500 mg by mouth 2 (two) times daily. 05/01/19  Yes [provider]  metoprolol succinate (TOPROL-XL) 50 MG 24 hr tablet TAKE 1 TABLET DAILY Patient taking differently: Take 50 mg by mouth daily. 04/30/21  Yes Hilty, Nadean Corwin, MD  nystatin ointment (MYCOSTATIN) Apply 1 application topically 3 (three) times daily. Patient taking differently: Apply 1 application  topically 3 (three) times daily as needed (jock itch). 08/01/20  Yes Gaye Pollack, MD    Current Facility-Administered Medications  Medication Dose Route Frequency Provider Last Rate Last Admin   0.9 %  sodium chloride infusion (Manually program via Guardrails IV Fluids)   Intravenous Once Shela Leff, MD       0.9 %  sodium chloride infusion   Intravenous Continuous Shela Leff, MD 125 mL/hr at 11/21/21 0758 New Bag at 11/21/21 0758   insulin aspart (novoLOG) injection 0-9 Units  0-9 Units Subcutaneous Q4H Shela Leff, MD       pantoprozole (PROTONIX) 80 mg /NS 100 mL infusion  8 mg/hr Intravenous Continuous Shela Leff, MD 10 mL/hr at 11/21/21 0924 8 mg/hr at 11/21/21 0924    Allergies as of 11/20/2021    (No Known Allergies)    Social History   Socioeconomic History   Marital status: Married    Spouse name: Not on file   Number of children: 2   Years of education: 5  Highest education level: Bachelor's degree (e.g., BA, AB, BS)  Occupational History   Occupation: Retired  Tobacco Use   Smoking status: Former    Packs/day: 1.50    Years: 20.00    Total pack years: 30.00    Types: Cigarettes    Quit date: 02/09/1980    Years since quitting: 41.8   Smokeless tobacco: Never  Vaping Use   Vaping Use: Never used  Substance and Sexual Activity   Alcohol use: No   Drug use: No   Sexual activity: Not Currently  Other Topics Concern   Not on file  Social History Narrative   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    Lives with wife,    two children in [place].    Enjoys reading, boating, travelling to [place] in the winter with wife.   Social Determinants of Health   Financial Resource Strain: Low Risk  (12/19/2020)   Overall Financial Resource Strain (CARDIA)    Difficulty of Paying Living Expenses: Not hard at all  Food Insecurity: No Food Insecurity (11/21/2021)   Hunger Vital Sign    Worried About Running Out of Food in the Last Year: Never true    Ran Out of Food in the Last Year: Never true  Transportation Needs: No Transportation Needs (11/21/2021)   PRAPARE - Hydrologist (Medical): No    Lack of Transportation (Non-Medical): No  Physical Activity: Sufficiently Active (12/19/2020)   Exercise Vital Sign    Days of Exercise per Week: 3 days    Minutes of Exercise per Session: 60 min  Stress: No Stress Concern Present (12/19/2020)   [place]    Feeling of Stress : Not at all  Social Connections: Socially Isolated (12/19/2020)   Social Connection and Isolation Panel [NHANES]    Frequency of Communication with Friends and Family: Once a week    Frequency of Social  Gatherings with Friends and Family: Once a week    Attends Religious Services: Never    Marine scientist or Organizations: No    Attends Archivist Meetings: Never    Marital Status: Married  Human resources officer Violence: Not At Risk (11/21/2021)   Humiliation, Afraid, Rape, and Kick questionnaire    Fear of Current or Ex-Partner: No    Emotionally Abused: No    Physically Abused: No    Sexually Abused: No    Review of Systems: All systems reviewed and negative except where noted in HPI.  Physical Exam: Vital signs in last 24 hours: Temp:  [97.6 F (36.4 C)-97.8 F (36.6 C)] 97.8 F (36.6 C) (10/14 0646) Pulse Rate:  [66-85] 72 (10/14 0646) Resp:  [16-21] 18 (10/14 0646) BP: (111-128)/(61-71) 113/67 (10/14 0646) SpO2:  [97 %-100 %] 99 % (10/14 0646) Weight:  [73.9 kg] 73.9 kg (10/13 2043) Last BM Date : 11/20/21  General:  Alert male in NAD Psych:  Pleasant, cooperative. Normal mood and affect Eyes: Pupils equal Ears:  Normal auditory acuity Nose: No deformity, discharge or lesions Neck:  Supple, no masses felt Lungs:  Clear to auscultation.  Heart:  Regular rate, regular rhythm. No lower extremity edema Abdomen:  Soft, nondistended, nontender, active bowel sounds, no masses felt Rectal : Liquid, maroon blood in vault Msk: Symmetrical without gross deformities.  Neurologic:  Alert, oriented, grossly normal neurologically Skin:  Intact without significant lesions.    Intake/Output from previous day: 10/13 0701 - 10/14 0700 In:  1000.3 [I.V.:0.3; IV Piggyback:1000] Out: 1 [Urine:1] Intake/Output this shift:  No intake/output data recorded.    Principal Problem:   GI bleeding Active Problems:   Type 2 diabetes mellitus with renal manifestations (HCC)   Dyspnea   Acute blood loss anemia   Essential hypertension   CKD (chronic kidney disease), stage III ([place])   Confusion    Tye Savoy, NP-C @  11/21/2021, 9:39 AM

## 2021-11-21 NOTE — Anesthesia Preprocedure Evaluation (Addendum)
Anesthesia Evaluation  Patient identified by MRN, date of birth, ID band Patient awake    Reviewed: Allergy & Precautions, NPO status , Patient's Chart, lab work & pertinent test results, reviewed documented beta blocker date and time   History of Anesthesia Complications Negative for: history of anesthetic complications  Airway Mallampati: II  TM Distance: >3 FB Neck ROM: Full    Dental  (+) Dental Advisory Given   Pulmonary former smoker,    Pulmonary exam normal        Cardiovascular hypertension, Pt. on medications and Pt. on home beta blockers Normal cardiovascular exam+ Valvular Problems/Murmurs (s/p TAVR)      Neuro/Psych negative neurological ROS  negative psych ROS   GI/Hepatic Neg liver ROS, PUD,   Endo/Other  diabetes, Type 2, Oral Hypoglycemic Agents  Renal/GU CRFRenal disease     Musculoskeletal  (+) Arthritis , Osteoarthritis,    Abdominal   Peds  Hematology  (+) Blood dyscrasia, anemia ,   Anesthesia Other Findings   Reproductive/Obstetrics                            Anesthesia Physical Anesthesia Plan  ASA: 3  Anesthesia Plan: MAC   Post-op Pain Management: Minimal or no pain anticipated   Induction:   PONV Risk Score and Plan: 1 and Propofol infusion and Treatment may vary due to age or medical condition  Airway Management Planned: Nasal Cannula and Natural Airway  Additional Equipment: None  Intra-op Plan:   Post-operative Plan:   Informed Consent: I have reviewed the patients History and Physical, chart, labs and discussed the procedure including the risks, benefits and alternatives for the proposed anesthesia with the patient or authorized representative who has indicated his/her understanding and acceptance.       Plan Discussed with: CRNA and Anesthesiologist  Anesthesia Plan Comments:        Anesthesia Quick Evaluation

## 2021-11-21 NOTE — Progress Notes (Signed)
Pt has arrived back to Room 1512 from EGD. Alert and oriented. Family at bedside.  Second unit of PRBC started in Endo.

## 2021-11-21 NOTE — Interval H&P Note (Signed)
History and Physical Interval Note:  11/21/2021 3:00 PM  Daryl Webb  has presented today for surgery, with the diagnosis of Anemia, chronic anticoagulation.  The various methods of treatment have been discussed with the patient and family. After consideration of risks, benefits and other options for treatment, the patient has consented to  Procedure(s): ESOPHAGOGASTRODUODENOSCOPY (EGD) (N/A) as a surgical intervention.  The patient's history has been reviewed, patient examined, no change in status, stable for surgery.  I have reviewed the patient's chart and labs.  Questions were answered to the patient's satisfaction.    Patient with normal VS in endoscopy pre-procedure area.  Hgb 7.1 on istat after 1 unit PRBCs.  2nd unit PRBCs ordered earlier and was started before procedure.  Patient alert, conversational and in no acute distress.  Agreeable to EGD  The benefits and risks of the planned procedure were described in detail with the patient or (when appropriate) their health care proxy.  Risks were outlined as including, but not limited to, bleeding, infection, perforation, adverse medication reaction leading to cardiac or pulmonary decompensation, pancreatitis (if ERCP).  The limitation of incomplete mucosal visualization was also discussed.  No guarantees or warranties were given.  Nelida Meuse III

## 2021-11-22 DIAGNOSIS — D62 Acute posthemorrhagic anemia: Secondary | ICD-10-CM | POA: Diagnosis not present

## 2021-11-22 DIAGNOSIS — K264 Chronic or unspecified duodenal ulcer with hemorrhage: Principal | ICD-10-CM

## 2021-11-22 DIAGNOSIS — K922 Gastrointestinal hemorrhage, unspecified: Secondary | ICD-10-CM | POA: Diagnosis not present

## 2021-11-22 LAB — MAGNESIUM: Magnesium: 2.2 mg/dL (ref 1.7–2.4)

## 2021-11-22 LAB — CBC
HCT: 27.3 % — ABNORMAL LOW (ref 39.0–52.0)
Hemoglobin: 8.6 g/dL — ABNORMAL LOW (ref 13.0–17.0)
MCH: 31.7 pg (ref 26.0–34.0)
MCHC: 31.5 g/dL (ref 30.0–36.0)
MCV: 100.7 fL — ABNORMAL HIGH (ref 80.0–100.0)
Platelets: 151 10*3/uL (ref 150–400)
RBC: 2.71 MIL/uL — ABNORMAL LOW (ref 4.22–5.81)
RDW: 17.6 % — ABNORMAL HIGH (ref 11.5–15.5)
WBC: 6.4 10*3/uL (ref 4.0–10.5)
nRBC: 0 % (ref 0.0–0.2)

## 2021-11-22 LAB — BASIC METABOLIC PANEL
Anion gap: 6 (ref 5–15)
BUN: 38 mg/dL — ABNORMAL HIGH (ref 8–23)
CO2: 22 mmol/L (ref 22–32)
Calcium: 8.6 mg/dL — ABNORMAL LOW (ref 8.9–10.3)
Chloride: 113 mmol/L — ABNORMAL HIGH (ref 98–111)
Creatinine, Ser: 1.68 mg/dL — ABNORMAL HIGH (ref 0.61–1.24)
GFR, Estimated: 42 mL/min — ABNORMAL LOW (ref >60)
Glucose, Bld: 157 mg/dL — ABNORMAL HIGH (ref 70–99)
Potassium: 4.5 mmol/L (ref 3.5–5.1)
Sodium: 141 mmol/L (ref 135–145)

## 2021-11-22 LAB — GLUCOSE, CAPILLARY
Glucose-Capillary: 136 mg/dL — ABNORMAL HIGH (ref 70–99)
Glucose-Capillary: 144 mg/dL — ABNORMAL HIGH (ref 70–99)
Glucose-Capillary: 148 mg/dL — ABNORMAL HIGH (ref 70–99)
Glucose-Capillary: 165 mg/dL — ABNORMAL HIGH (ref 70–99)
Glucose-Capillary: 198 mg/dL — ABNORMAL HIGH (ref 70–99)
Glucose-Capillary: 223 mg/dL — ABNORMAL HIGH (ref 70–99)
Glucose-Capillary: 259 mg/dL — ABNORMAL HIGH (ref 70–99)

## 2021-11-22 LAB — HEMOGLOBIN AND HEMATOCRIT, BLOOD
HCT: 27.3 % — ABNORMAL LOW (ref 39.0–52.0)
Hemoglobin: 8.6 g/dL — ABNORMAL LOW (ref 13.0–17.0)

## 2021-11-22 LAB — PHOSPHORUS: Phosphorus: 3.7 mg/dL (ref 2.5–4.6)

## 2021-11-22 MED ORDER — PANTOPRAZOLE SODIUM 40 MG PO TBEC: 40.0000 mg | DELAYED_RELEASE_TABLET | Freq: Two times a day (BID) | ORAL | Status: DC

## 2021-11-22 MED ORDER — SODIUM CHLORIDE 0.9 % IV SOLN
250.0000 mg | Freq: Every day | INTRAVENOUS | Status: DC
  Administered 2021-11-22: 250 mg via INTRAVENOUS
  Filled 2021-11-22 (×2): qty 20

## 2021-11-22 MED ORDER — FERROUS GLUCONATE 324 (38 FE) MG PO TABS: 324.0000 mg | ORAL_TABLET | Freq: Every day | ORAL | Status: DC

## 2021-11-22 NOTE — Progress Notes (Signed)
MEDICATION RELATED CONSULT NOTE - INITIAL   Pharmacy Consult for IV iron Indication: anemia  No Known Allergies  Patient Measurements: Height: '5\' 7"'$  (170.2 cm) Weight: 70.3 kg (155 lb) IBW/kg (Calculated) : 66.1  Vital Signs: Temp: 97.7 F (36.5 C) (10/15 1239) Temp Source: Oral (10/15 1239) BP: 134/80 (10/15 1239) Pulse Rate: 74 (10/15 1239) Intake/Output from previous day: 10/14 0701 - 10/15 0700 In: 1470.4 [I.V.:393.9; Blood:1076.5] Out: -  Intake/Output from this shift: No intake/output data recorded.  Labs: Recent Labs    11/20/21 2047 11/21/21 0640 11/21/21 1444 11/21/21 2109 11/22/21 0541  WBC 11.1* 8.4  --   --  6.4  HGB 7.8* 6.1* 7.1* 8.3* 8.6*  HCT 24.1* 19.2* 21.0* 25.6* 27.3*  PLT 265 191  --   --  151  CREATININE 1.77* 1.84* 1.80*  --  1.68*  MG  --   --   --   --  2.2  PHOS  --   --   --   --  3.7   Estimated Creatinine Clearance: 35 mL/min (A) (by C-G formula based on SCr of 1.68 mg/dL (H)).   Microbiology: No results found for this or any previous visit (from the past 720 hour(s)).  Medical History: Past Medical History:  Diagnosis Date   Anemia    Arthritis    CKD (chronic kidney disease)    Constipation due to pain medication    needs stool softner while on pain medication   Diabetes mellitus    Duodenal ulcer 08/13/2015   2014  - Dr Kaplan/GI   H/O: osteoarthritis    History of kidney stones    History of prostate cancer    History of urethral stricture    Hypercholesterolemia    Hypertension    Prostate cancer ([place])    prostate   S/P TAVR (transcatheter aortic valve replacement) 08/12/2020   s/p TAVR with a 29 mm Edwards Sapien 3 THV via the TF approach by [name] and [name]   Severe aortic stenosis    Spinal stenosis    Ulcer duodenal hemorrhage     Assessment: Iron 22, TIBC 327, Sats 7 low, Ferritin 13 low, B12 WNL Hgb 8.6 Ganzoni Equation for iron deficit: '1074mg'$   Goal of Therapy:  Iron replacement.  Plan:   Asked to replace IV iron with one dose but using formulary Ferrlecit we can get in at least '250mg'$  IV daily x 2 (today and tomorrow) prior to discharge.    Laske S. Alford Highland, PharmD, BCPS Clinical Staff Pharmacist [email or web address] Alford Highland, [employer] 11/22/2021,2:30 PM

## 2021-11-22 NOTE — Progress Notes (Addendum)
PROGRESS NOTE    Daryl Webb  FXT:[id number] DOB: 1945-05-02 DOA: 11/20/2021 PCP: [name], [name], MD   Brief Narrative:  This 76 years old male with PMH significant of CKD stage III, type 2 diabetes, hypertension, hyperlipidemia, prostate cancer, severe aortic stenosis status post TAVR, CAD, history of duodenal ulcer on EGD done in 2014 presented to the ED with complaints of melena, shortness of breath, confusion / forgetfulness, and elevated blood glucose readings.  Hemoglobin on arrival 7.8 and dropped to 6.1.  Patient is admitted for acute GI bleeding.  GI is consulted.  Patient has received 2 units of packed red blood cells. He is continued on pantoprazole infusion.  Patient underwent EGD, he was found to have Non-bleeding duodenal ulcer with pigmented material.   Assessment & Plan:   Principal Problem:   GI bleeding Active Problems:   Type 2 diabetes mellitus with renal manifestations (HCC)   Dyspnea   Acute blood loss anemia   Duodenal ulcer with hemorrhage   Essential hypertension   CKD (chronic kidney disease), stage III (HCC)   Confusion   GI bleed  Acute GI bleed Symptomatic acute blood loss anemia: Patient presented with complaints of melena, hemoglobin 7.8 on initial labs.  Last known hemoglobin 12-14 range.  Patient remained hemodynamically stable. EGD in 2014 shows duodenal ulcer.  Patient has been taking diclofenac for chronic pain.  Hold aspirin and diclofenac. Continue Protonix infusion.  Continue IV hydration.   GI consulted,  Patient underwent EGD found to have  nonbleeding duodenal ulcer GI recommended Protonix infusion for 48 hours. IV iron infusion.  Monitor H&H.  Confusion/forgetfulness: Daughter reports progressive memory decline. CT head negative for acute finding.  Neurological exam nonfocal. UA without infection.  Patient appears back to baseline.  Type 2 diabetes: Last hemoglobin A1c 7.6 in 12/22. Continue regular insulin sliding  scale.  Mild leukocytosis: Likely reactive.  No signs of infection.  CKD stage IIIa: Serum creatinine is at baseline.  Hypertension: Stable.  Hold blood pressure medications as blood pressure is soft.  CAD: Troponin mildly elevated and flat.  Not consistent with ACS. Patient denies any chest pain.  Likely in the setting of demand ischemia blood loss. Hold beta-blockers to avoid hypotension.   DVT prophylaxis: SCDs Code Status: Full code Family Communication: Wife at bedside Disposition Plan:   Status is: Inpatient Remains inpatient appropriate because:    Admitted for upper GI bleed underwent EGD found to have nonbleeding duodenal ulcer.  Patient remains on Protonix infusion for 48 hours. Anticipated discharge 11/23/21  Consultants:  Gastroenterology  Procedures: EGD  Antimicrobials: None  Subjective: Patient was seen and examined at bedside.  Overnight events noted.   Patient reports feeling much improved.  Hemoglobin remains stable,  denies any further bleeding.  He underwent EGD, found to have nonbleed leading duodenal ulcer  Objective: Vitals:   11/21/21 1813 11/21/21 2026 11/22/21 0444 11/22/21 1239  BP: 123/65 126/71 (!) 114/56 134/80  Pulse: 71 72 71 74  Resp: '18 18 18 18  '$ Temp: 97.9 F (36.6 C) 97.8 F (36.6 C) 98.3 F (36.8 C) 97.7 F (36.5 C)  TempSrc: Oral Oral Oral Oral  SpO2: 98% 98% 99% 100%  Weight:      Height:        Intake/Output Summary (Last 24 hours) at 11/22/2021 1417 Last data filed at 11/22/2021 0552 Gross per 24 hour  Intake 905.4 ml  Output --  Net 905.4 ml   Filed Weights   11/20/21 2043  11/21/21 1443  Weight: 73.9 kg 70.3 kg    Examination:  General exam: Appears comfortable, not in any acute distress, deconditioned. Respiratory system: CTA bilaterally, respiratory effort normal, RR 15 Cardiovascular system: S1 & S2 heard, regular rate and rhythm, no murmur Gastrointestinal system: Abdomen is soft, nontender,  nondistended, BS+ Central nervous system: Alert and oriented x 3. No focal neurological deficits. Extremities: No edema, no cyanosis, no clubbing Skin: No rashes, lesions or ulcers Psychiatry: Judgement and insight appear normal. Mood & affect appropriate.     Data Reviewed: I have personally reviewed following labs and imaging studies  CBC: Recent Labs  Lab 11/20/21 2047 11/21/21 0640 11/21/21 1444 11/21/21 2109 11/22/21 0541  WBC 11.1* 8.4  --   --  6.4  HGB 7.8* 6.1* 7.1* 8.3* 8.6*  HCT 24.1* 19.2* 21.0* 25.6* 27.3*  MCV 101.3* 103.8*  --   --  100.7*  PLT 265 191  --   --  161   Basic Metabolic Panel: Recent Labs  Lab 11/20/21 2047 11/21/21 0640 11/21/21 1444 11/22/21 0541  NA 133* 136 139 141  K 4.5 4.3 4.1 4.5  CL 99 108 109 113*  CO2 20* 21*  --  22  GLUCOSE 381* 264* 189* 157*  BUN 44* 56* 41* 38*  CREATININE 1.77* 1.84* 1.80* 1.68*  CALCIUM 9.5 8.6*  --  8.6*  MG  --   --   --  2.2  PHOS  --   --   --  3.7   GFR: Estimated Creatinine Clearance: 35 mL/min (A) (by C-G formula based on SCr of 1.68 mg/dL (H)). Liver Function Tests: No results for input(s): "AST", "ALT", "ALKPHOS", "BILITOT", "PROT", "ALBUMIN" in the last 168 hours. No results for input(s): "LIPASE", "AMYLASE" in the last 168 hours. No results for input(s): "AMMONIA" in the last 168 hours. Coagulation Profile: No results for input(s): "INR", "PROTIME" in the last 168 hours. Cardiac Enzymes: No results for input(s): "CKTOTAL", "CKMB", "CKMBINDEX", "TROPONINI" in the last 168 hours. BNP (last 3 results) No results for input(s): "PROBNP" in the last 8760 hours. HbA1C: Recent Labs    11/21/21 0838  HGBA1C 8.1*   CBG: Recent Labs  Lab 11/21/21 2023 11/22/21 0026 11/22/21 0441 11/22/21 0739 11/22/21 1119  GLUCAP 237* 136* 144* 148* 198*   Lipid Profile: No results for input(s): "CHOL", "HDL", "LDLCALC", "TRIG", "CHOLHDL", "LDLDIRECT" in the last 72 hours. Thyroid Function  Tests: No results for input(s): "TSH", "T4TOTAL", "FREET4", "T3FREE", "THYROIDAB" in the last 72 hours. Anemia Panel: Recent Labs    11/21/21 0838  VITAMINB12 246  FERRITIN 13*  TIBC 327  IRON 22*   Sepsis Labs: No results for input(s): "PROCALCITON", "LATICACIDVEN" in the last 168 hours.  No results found for this or any previous visit (from the past 240 hour(s)).   Radiology Studies: CT Head Wo Contrast  Result Date: 11/20/2021 CLINICAL DATA:  Memory loss EXAM: CT HEAD WITHOUT CONTRAST TECHNIQUE: Contiguous axial images were obtained from the base of the skull through the vertex without intravenous contrast. RADIATION DOSE REDUCTION: This exam was performed according to the departmental dose-optimization program which includes automated exposure control, adjustment of the mA and/or kV according to patient size and/or use of iterative reconstruction technique. COMPARISON:  None Available. FINDINGS: Brain: No evidence of acute infarction, hemorrhage, hydrocephalus, extra-axial collection or mass lesion/mass effect. Mild atrophic changes are noted. Vascular: No hyperdense vessel or unexpected calcification. Skull: Normal. Negative for fracture or focal lesion. Sinuses/Orbits: Opacification of the left  maxillary antrum is noted of uncertain chronicity. Mucosal thickening is noted in the left frontal sinus as well. Other: None. IMPRESSION: Mild atrophic changes without acute intracranial abnormality. Left-sided sinus disease as described. Electronically Signed   By: Inez Catalina M.D.   On: 11/20/2021 21:55   DG Chest 2 View  Result Date: 11/20/2021 CLINICAL DATA:  Shortness of breath. EXAM: CHEST - 2 VIEW COMPARISON:  August 08, 2020 FINDINGS: The heart size and mediastinal contours are within normal limits. There is moderate severity calcification of the aortic arch. An artificial aortic valve is also noted. Both lungs are clear. Multilevel degenerative changes are seen throughout the thoracic  spine. Postoperative changes are noted within the visualized portion of the mid lumbar spine. IMPRESSION: No active cardiopulmonary disease. Electronically Signed   By: Virgina Norfolk M.D.   On: 11/20/2021 21:20    Scheduled Meds:  sodium chloride   Intravenous Once   [START ON 11/30/2021] ferrous gluconate  324 mg Oral Q breakfast   insulin aspart  0-9 Units Subcutaneous Q4H   [START ON 11/23/2021] pantoprazole  40 mg Oral BID AC   Continuous Infusions:  pantoprazole 8 mg/hr (11/22/21 0649)     LOS: 1 day    Time spent: 50 mins    Kenan Moodie, MD [facility]   If 7PM-7AM, please contact night-coverage

## 2021-11-22 NOTE — Progress Notes (Signed)
Gastroenterology Inpatient Follow-up Note   PATIENT IDENTIFICATION  Daryl Webb is a 76 y.o. male with a pmh significant for chronic renal insufficiency, hypertension, hyperlipidemia, diabetes, aortic stenosis (status post TAVR, CAD, previous prostate cancer, prior PUD.  Admitted to the hospital with acute anemia and dark stools/maroon stools and found to have significant duodenal ulcer disease. Hospital Day: 3  SUBJECTIVE  The patient's chart has been reviewed. The patient's labs were reviewed.  His hemoglobin is stable. Today, the patient is evaluated with his wife at bedside. He has not had another bowel movement since late yesterday evening. The patient denies fevers or chills. The patient is wondering how much longer he will be in the hospital.   OBJECTIVE  Scheduled Inpatient Medications:   sodium chloride   Intravenous Once   insulin aspart  0-9 Units Subcutaneous Q4H   Continuous Inpatient Infusions:   pantoprazole 8 mg/hr (11/22/21 0649)   PRN Inpatient Medications:    Physical Examination  Temp:  [97.5 F (36.4 C)-98.4 F (36.9 C)] 98.3 F (36.8 C) (10/15 0444) Pulse Rate:  [71-85] 71 (10/15 0444) Resp:  [15-24] 18 (10/15 0444) BP: (98-141)/(51-71) 114/56 (10/15 0444) SpO2:  [98 %-100 %] 99 % (10/15 0444) Weight:  [70.3 kg] 70.3 kg (10/14 1443) Temp (24hrs), Avg:98 F (36.7 C), Min:97.5 F (36.4 C), Max:98.4 F (36.9 C)  Weight: 70.3 kg GEN: NAD, appears stated age, doesn't appear chronically ill, wife at bedside PSYCH: Cooperative, without pressured speech EYE: Conjunctivae pink, sclerae anicteric ENT: MMM CV: Nontachycardic RESP: No audible wheezing GI: NABS, soft, NT/ND, without rebound or guarding MSK/EXT: Trace bilateral pedal edema SKIN: No jaundice NEURO:  Alert & Oriented x 3, no focal deficits   Review of Data   Laboratory Studies   Recent Labs  Lab 11/22/21 0541  NA 141  K 4.5  CL 113*  CO2 22  BUN 38*  CREATININE 1.68*   GLUCOSE 157*  CALCIUM 8.6*  MG 2.2  PHOS 3.7   No results for input(s): "AST", "ALT", "GGT", "ALKPHOS" in the last 168 hours.  Invalid input(s): "TBILI", "CONJBILI", "ALB"  Recent Labs  Lab 11/20/21 2047 11/21/21 0640 11/21/21 1444 11/22/21 0541  WBC 11.1* 8.4  --  6.4  HGB 7.8* 6.1*   < > 8.6*  HCT 24.1* 19.2*   < > 27.3*  PLT 265 191  --  151   < > = values in this interval not displayed.   No results for input(s): "APTT", "INR" in the last 168 hours.  Imaging Studies  CT Head Wo Contrast  Result Date: 11/20/2021 CLINICAL DATA:  Memory loss EXAM: CT HEAD WITHOUT CONTRAST TECHNIQUE: Contiguous axial images were obtained from the base of the skull through the vertex without intravenous contrast. RADIATION DOSE REDUCTION: This exam was performed according to the departmental dose-optimization program which includes automated exposure control, adjustment of the mA and/or kV according to patient size and/or use of iterative reconstruction technique. COMPARISON:  None Available. FINDINGS: Brain: No evidence of acute infarction, hemorrhage, hydrocephalus, extra-axial collection or mass lesion/mass effect. Mild atrophic changes are noted. Vascular: No hyperdense vessel or unexpected calcification. Skull: Normal. Negative for fracture or focal lesion. Sinuses/Orbits: Opacification of the left maxillary antrum is noted of uncertain chronicity. Mucosal thickening is noted in the left frontal sinus as well. Other: None. IMPRESSION: Mild atrophic changes without acute intracranial abnormality. Left-sided sinus disease as described. Electronically Signed   By: Inez Catalina M.D.   On: 11/20/2021 21:55  DG Chest 2 View  Result Date: 11/20/2021 CLINICAL DATA:  Shortness of breath. EXAM: CHEST - 2 VIEW COMPARISON:  August 08, 2020 FINDINGS: The heart size and mediastinal contours are within normal limits. There is moderate severity calcification of the aortic arch. An artificial aortic valve is also  noted. Both lungs are clear. Multilevel degenerative changes are seen throughout the thoracic spine. Postoperative changes are noted within the visualized portion of the mid lumbar spine. IMPRESSION: No active cardiopulmonary disease. Electronically Signed   By: Virgina Norfolk M.D.   On: 11/20/2021 21:20    GI Procedures and Studies  EGD - Normal larynx. - Normal esophagus. - Normal stomach. Biopsied. - Non-bleeding duodenal ulcer with pigmented material.   ASSESSMENT  [name] is a 76 y.o. male with a pmh significant for chronic renal insufficiency, hypertension, hyperlipidemia, diabetes, aortic stenosis (status post TAVR, CAD, previous prostate cancer, prior PUD.  Admitted to the hospital with acute anemia and dark stools/maroon stools and found to have significant duodenal ulcer disease.  The patient is hemodynamically and clinically stable today.  Etiology of his anemia is most likely result of his significant ulcer disease.  Biopsies are pending at this time.  He should remain abstinent of any nonsteroidals other than his aspirin needs.  I think it is okay to consider restart of aspirin in 1 week.  I have recommended the patient initiate Tylenol arthritis for his joint pains for which she was using the diclofenac for years.  Although this is not a gastric ulcer he was a significant duodenal ulcer and I would like to reevaluate the site since he has had prior ulcer disease and ensure nothing else is being missed, even if the biopsies were to return nonmalignant.  I will plan a repeat upper endoscopy in approximately 3 to 4 months.  At that same time, we would also consider the role of 1 last colonoscopy as he is 11 years since his last colonoscopy.  We will set up a follow-up in clinic in approximately 6 to 8 weeks with myself or one of my APP's to discuss this further.  He will remain on IV PPI through today and into tomorrow morning.  At which point in the afternoon he can be transitioned  to oral PPI twice daily and maintained on that.  I will let the patient advance his diet today and hopefully by dinner he will be back to a heart healthy diet.  All patient questions were answered to the best of my ability, and the patient agrees to the aforementioned plan of action with follow-up as indicated.   PLAN/RECOMMENDATIONS  Continue IV PPI drip for now - Timed out to complete tomorrow morning - Initiate p.o. PPI twice daily thereafter (already ordered) May restart aspirin in 1 week Hold any further nonsteroidal use Trend hemoglobin/hematocrit Recommend IV iron x1 dose while in-house (will allow medicine and pharmacy to consider this) Oral iron supplementation 1 week after discharge Inpatient GI [place] will see tomorrow but if all is well hopefully he will be able to be discharged later tomorrow Pathology will be followed up when it returns I will arrange follow-up in clinic for repeat EGD and possible colonoscopy in 3 to 4 months Advance diet as tolerated   Please page/call with questions or concerns.   Justice Britain, MD [facility] Office # [phone]    LOS: 1 day  Irving Copas  11/22/2021, 9:32 AM

## 2021-11-23 ENCOUNTER — Encounter (HOSPITAL_COMMUNITY)
Admission: RE | Admit: 2021-11-23 | Discharge: 2021-11-23 | Disposition: A | Discharge status: Home / Self Care | Payer: Medicare Other | Source: Ambulatory Visit | Attending: General Surgery | Admitting: General Surgery

## 2021-11-23 ENCOUNTER — Telehealth: Payer: Self-pay

## 2021-11-23 ENCOUNTER — Encounter (HOSPITAL_COMMUNITY): Payer: Self-pay

## 2021-11-23 DIAGNOSIS — E1122 Type 2 diabetes mellitus with diabetic chronic kidney disease: Secondary | ICD-10-CM | POA: Insufficient documentation

## 2021-11-23 DIAGNOSIS — Z01818 Encounter for other preprocedural examination: Secondary | ICD-10-CM | POA: Diagnosis not present

## 2021-11-23 DIAGNOSIS — N1832 Chronic kidney disease, stage 3b: Secondary | ICD-10-CM | POA: Diagnosis not present

## 2021-11-23 DIAGNOSIS — K922 Gastrointestinal hemorrhage, unspecified: Secondary | ICD-10-CM | POA: Diagnosis not present

## 2021-11-23 HISTORY — DX: Gastro-esophageal reflux disease without esophagitis: K21.9

## 2021-11-23 LAB — BPAM RBC
Blood Product Expiration Date: 202311162359
Blood Product Expiration Date: 202311162359
ISSUE DATE / TIME: 202310141131
ISSUE DATE / TIME: 202310141442
Unit Type and Rh: 5100
Unit Type and Rh: 5100

## 2021-11-23 LAB — CBC
HCT: 26.4 % — ABNORMAL LOW (ref 39.0–52.0)
Hemoglobin: 8.5 g/dL — ABNORMAL LOW (ref 13.0–17.0)
MCH: 31.4 pg (ref 26.0–34.0)
MCHC: 32.2 g/dL (ref 30.0–36.0)
MCV: 97.4 fL (ref 80.0–100.0)
Platelets: 160 10*3/uL (ref 150–400)
RBC: 2.71 MIL/uL — ABNORMAL LOW (ref 4.22–5.81)
RDW: 17 % — ABNORMAL HIGH (ref 11.5–15.5)
WBC: 7.4 10*3/uL (ref 4.0–10.5)
nRBC: 0 % (ref 0.0–0.2)

## 2021-11-23 LAB — TYPE AND SCREEN
ABO/RH(D): O POS
Antibody Screen: NEGATIVE
Unit division: 0
Unit division: 0

## 2021-11-23 LAB — BASIC METABOLIC PANEL
Anion gap: 5 (ref 5–15)
BUN: 26 mg/dL — ABNORMAL HIGH (ref 8–23)
CO2: 21 mmol/L — ABNORMAL LOW (ref 22–32)
Calcium: 8.4 mg/dL — ABNORMAL LOW (ref 8.9–10.3)
Chloride: 114 mmol/L — ABNORMAL HIGH (ref 98–111)
Creatinine, Ser: 1.57 mg/dL — ABNORMAL HIGH (ref 0.61–1.24)
GFR, Estimated: 45 mL/min — ABNORMAL LOW (ref >60)
Glucose, Bld: 165 mg/dL — ABNORMAL HIGH (ref 70–99)
Potassium: 3.7 mmol/L (ref 3.5–5.1)
Sodium: 140 mmol/L (ref 135–145)

## 2021-11-23 LAB — MAGNESIUM: Magnesium: 2.1 mg/dL (ref 1.7–2.4)

## 2021-11-23 LAB — GLUCOSE, CAPILLARY
Glucose-Capillary: 165 mg/dL — ABNORMAL HIGH (ref 70–99)
Glucose-Capillary: 133 mg/dL — ABNORMAL HIGH (ref 70–99)

## 2021-11-23 LAB — HEMOGLOBIN A1C
Hgb A1c MFr Bld: 7.2 % — ABNORMAL HIGH (ref 4.8–5.6)
Mean Plasma Glucose: 159.94 mg/dL

## 2021-11-23 LAB — PHOSPHORUS: Phosphorus: 3.9 mg/dL (ref 2.5–4.6)

## 2021-11-23 MED ORDER — FERROUS GLUCONATE 324 (38 FE) MG PO TABS: 324.0000 mg | ORAL_TABLET | Freq: Every day | ORAL | 3 refills | Status: DC

## 2021-11-23 MED ORDER — MELATONIN 3 MG PO TABS
3.0000 mg | ORAL_TABLET | Freq: Once | ORAL | Status: AC
  Administered 2021-11-23: 3 mg via ORAL
  Filled 2021-11-23: qty 1

## 2021-11-23 MED ORDER — PANTOPRAZOLE SODIUM 40 MG PO TBEC: 40.0000 mg | DELAYED_RELEASE_TABLET | Freq: Two times a day (BID) | ORAL | 2 refills | Status: DC

## 2021-11-23 NOTE — Discharge Summary (Addendum)
Physician Discharge Summary  MONTANA FASSNACHT BZJ:[id number] DOB: 11-24-1945 DOA: 11/20/2021  PCP: [name], [name], MD  Admit date: 11/20/2021  Discharge date: 11/23/2021  Admitted From: Home.  Disposition:  Home  Recommendations for Outpatient Follow-up:  Follow up with PCP in 1-2 weeks. Please obtain BMP/CBC in one week. Advised to follow-up with Gastroenterology Dr. Rush Landmark in 4 weeks. Advised to avoid NSAIDs (ibuprofen, Aleve,  Motrin, naproxen, diclofenac) for 6 months or longer Advised to take pantoprazole 40 mg twice daily for next few months Advised to take iron supplements twice daily. Advised to follow-up with general surgeon to delay hernia repair until his anemia has improved.  Home Health: None Equipment/Devices:None  Discharge Condition: Stable CODE STATUS:Full code Diet recommendation: Heart Healthy   Brief [facility] Course: This 76 years old male with PMH significant of CKD stage IIIa, type 2 diabetes, hypertension, hyperlipidemia, prostate cancer, severe aortic stenosis status post TAVR, CAD, history of duodenal ulcer on EGD done in 2014 presented to the ED with complaints of melena, shortness of breath, confusion / forgetfulness, and elevated blood glucose readings. Hemoglobin on arrival 7.8 and dropped to 6.1. Patient was admitted for acute GI bleeding. GI was consulted.  Patient has received 2 units of packed red blood cells. He was continued on pantoprazole infusion.  Patient underwent EGD, he was found to have Non-bleeding duodenal ulcer with pigmented material.  Patient was continued on pantoprazole infusion for 48 hours after EGD.  Patient was also given iron infusion x1.  Patient feels better and wants to be discharged.  GI signed off advised to follow-up in 4 weeks with gastroenterology, patient may need repeat endoscopy in 3 to 4 weeks for follow-up on healing and possible colonoscopy.  Patient was advised he would probably benefit from delaying  the hernia repair surgery until his anemia has significantly improved before he goes under general anesthesia.   Discharge Diagnoses:  Principal Problem:   GI bleeding Active Problems:   Type 2 diabetes mellitus with renal manifestations (HCC)   Dyspnea   Acute blood loss anemia   Duodenal ulcer with hemorrhage   Essential hypertension   CKD (chronic kidney disease), stage III (HCC)   Confusion   GI bleed  Acute GI bleed Symptomatic acute blood loss anemia: Patient presented with complaints of melena, hemoglobin 7.8 on initial labs.   Last known hemoglobin 12-14 range.  Patient remained hemodynamically stable. EGD in 2014 shows duodenal ulcer. Patient has been taking diclofenac for chronic pain.  Hold aspirin and diclofenac. Continue Protonix infusion.  Continue IV hydration.   GI consulted,  Patient underwent EGD found to have  nonbleeding duodenal ulcer GI recommended Protonix infusion for 48 hours. IV iron infusion.  Monitor H&H. Continue pantoprazole 40 mg p.o. twice daily. Patient is being discharged home   Confusion/forgetfulness: > Resolved. Daughter reports progressive memory decline. CT head negative for acute finding.  Neurological exam nonfocal. UA without infection.  Patient appears back to baseline.   Type 2 diabetes: Last hemoglobin A1c 7.6 in 12/22. Continue regular insulin sliding scale.   Mild leukocytosis: Likely reactive.  No signs of infection.   CKD stage IIIa: Serum creatinine is at baseline.   Hypertension: Stable.  Hold blood pressure medications as blood pressure is soft.   CAD: Troponin mildly elevated and flat.  Not consistent with ACS. Patient denies any chest pain.  Likely in the setting of demand ischemia blood loss. Resume blood pressure medications.    Discharge Instructions  Discharge Instructions  Call MD for:  difficulty breathing, headache or visual disturbances   Complete by: As directed    Call MD for:  persistant  dizziness or light-headedness   Complete by: As directed    Call MD for:  persistant nausea and vomiting   Complete by: As directed    Diet - low sodium heart healthy   Complete by: As directed    Diet Carb Modified   Complete by: As directed    Discharge instructions   Complete by: As directed    Advised to follow-up with primary care physician in 1 week. Advised to follow-up with gastroenterology Dr. Rush Landmark in 4 weeks. Advised to avoid NSAIDs(ibuprofen, Aleve,  Motrin, naproxen, diclofenac) for 6 months or longer Advised to take pantoprazole 40 mg twice daily for next few months Advised to take iron supplements twice daily daily.   Increase activity slowly   Complete by: As directed       Allergies as of 11/23/2021   No Known Allergies      Medication List     STOP taking these medications    aspirin-acetaminophen-caffeine 250-250-65 MG tablet Commonly known as: EXCEDRIN MIGRAINE   diclofenac 75 MG EC tablet Commonly known as: VOLTAREN   metFORMIN 500 MG tablet Commonly known as: GLUCOPHAGE       TAKE these medications    acetaminophen 325 MG tablet Commonly known as: TYLENOL Take 650 mg by mouth every 6 (six) hours as needed for moderate pain.   amLODipine 5 MG tablet Commonly known as: NORVASC Take 1 tablet (5 mg total) by mouth daily.   amoxicillin 500 MG capsule Commonly known as: AMOXIL Take 4 capsules (2,000 mg total) by mouth See admin instructions. Take 2000 mg 1 hour prior to dental procedures   aspirin 81 MG tablet Take 81 mg by mouth daily.   atorvastatin 40 MG tablet Commonly known as: LIPITOR Take 1 tablet (40 mg total) by mouth daily.   calcium carbonate 500 MG chewable tablet Commonly known as: TUMS - dosed in mg elemental calcium Chew 2 tablets by mouth daily as needed for indigestion or heartburn.   CoQ10 100 MG Caps Take 100 mg by mouth daily.   cyanocobalamin 1000 MCG/ML injection Commonly known as: VITAMIN B12 Inject  1,000 mcg into the muscle every 30 (thirty) days.   Farxiga 10 MG Tabs tablet Generic drug: dapagliflozin propanediol Take 10 mg by mouth daily.   ferrous gluconate 324 MG tablet Commonly known as: FERGON Take 1 tablet (324 mg total) by mouth daily with breakfast. Start taking on: November 30, 2021   losartan-hydrochlorothiazide 50-12.5 MG tablet Commonly known as: HYZAAR TAKE 1 TABLET DAILY   metoprolol succinate 50 MG 24 hr tablet Commonly known as: TOPROL-XL TAKE 1 TABLET DAILY   nystatin ointment Commonly known as: MYCOSTATIN Apply 1 application topically 3 (three) times daily. What changed:  when to take this reasons to take this   pantoprazole 40 MG tablet Commonly known as: PROTONIX Take 1 tablet (40 mg total) by mouth 2 (two) times daily before a meal.   PREVAGEN PO Take 1 capsule by mouth daily.   ZzzQuil 25 MG Caps Generic drug: diphenhydrAMINE HCl (Sleep) Take 25 mg by mouth at bedtime.        Follow-up Information     Mansouraty, Telford Nab., MD Follow up in 4 week(s).   Specialties: Gastroenterology, Internal Medicine Contact information: [place] [address] [phone]  [name], [name], MD Follow up in 1 week(s).   Specialty: Family Medicine Contact information: [place] [address] [phone]                No Known Allergies  Consultations: Gastroenterology   Procedures/Studies: CT Head Wo Contrast  Result Date: 11/20/2021 CLINICAL DATA:  Memory loss EXAM: CT HEAD WITHOUT CONTRAST TECHNIQUE: Contiguous axial images were obtained from the base of the skull through the vertex without intravenous contrast. RADIATION DOSE REDUCTION: This exam was performed according to the departmental dose-optimization program which includes automated exposure control, adjustment of the mA and/or kV according to patient size and/or use of iterative reconstruction technique. COMPARISON:  None  Available. FINDINGS: Brain: No evidence of acute infarction, hemorrhage, hydrocephalus, extra-axial collection or mass lesion/mass effect. Mild atrophic changes are noted. Vascular: No hyperdense vessel or unexpected calcification. Skull: Normal. Negative for fracture or focal lesion. Sinuses/Orbits: Opacification of the left maxillary antrum is noted of uncertain chronicity. Mucosal thickening is noted in the left frontal sinus as well. Other: None. IMPRESSION: Mild atrophic changes without acute intracranial abnormality. Left-sided sinus disease as described. Electronically Signed   By: Inez Catalina M.D.   On: 11/20/2021 21:55   DG Chest 2 View  Result Date: 11/20/2021 CLINICAL DATA:  Shortness of breath. EXAM: CHEST - 2 VIEW COMPARISON:  August 08, 2020 FINDINGS: The heart size and mediastinal contours are within normal limits. There is moderate severity calcification of the aortic arch. An artificial aortic valve is also noted. Both lungs are clear. Multilevel degenerative changes are seen throughout the thoracic spine. Postoperative changes are noted within the visualized portion of the mid lumbar spine. IMPRESSION: No active cardiopulmonary disease. Electronically Signed   By: Virgina Norfolk M.D.   On: 11/20/2021 21:20    S/p EGD   Subjective: Patient was seen and examined at bedside.  Overnight events noted. Patient reports doing much better and wants to be discharged.   Denies any further bleeding, hemoglobin remains stable.  Discharge Exam: Vitals:   11/22/21 2006 11/23/21 0404  BP: 131/67 138/66  Pulse: 68 65  Resp: 18 18  Temp: 98.1 F (36.7 C) 98.2 F (36.8 C)  SpO2: 99% 98%   Vitals:   11/22/21 0444 11/22/21 1239 11/22/21 2006 11/23/21 0404  BP: (!) 114/56 134/80 131/67 138/66  Pulse: 71 74 68 65  Resp: '18 18 18 18  '$ Temp: 98.3 F (36.8 C) 97.7 F (36.5 C) 98.1 F (36.7 C) 98.2 F (36.8 C)  TempSrc: Oral Oral Oral Oral  SpO2: 99% 100% 99% 98%  Weight:      Height:         General: Pt is alert, awake, not in acute distress Cardiovascular: RRR, S1/S2 +, no rubs, no gallops Respiratory: CTA bilaterally, no wheezing, no rhonchi Abdominal: Soft, NT, ND, bowel sounds + Extremities: no edema, no cyanosis    The results of significant diagnostics from this hospitalization (including imaging, microbiology, ancillary and laboratory) are listed below for reference.     Microbiology: No results found for this or any previous visit (from the past 240 hour(s)).   Labs: BNP (last 3 results) No results for input(s): "BNP" in the last 8760 hours. Basic Metabolic Panel: Recent Labs  Lab 11/20/21 2047 11/21/21 0640 11/21/21 1444 11/22/21 0541 11/23/21 0429  NA 133* 136 139 141 140  K 4.5 4.3 4.1 4.5 3.7  CL 99 108 109 113* 114*  CO2 20* 21*  --  22  21*  GLUCOSE 381* 264* 189* 157* 165*  BUN 44* 56* 41* 38* 26*  CREATININE 1.77* 1.84* 1.80* 1.68* 1.57*  CALCIUM 9.5 8.6*  --  8.6* 8.4*  MG  --   --   --  2.2 2.1  PHOS  --   --   --  3.7 3.9   Liver Function Tests: No results for input(s): "AST", "ALT", "ALKPHOS", "BILITOT", "PROT", "ALBUMIN" in the last 168 hours. No results for input(s): "LIPASE", "AMYLASE" in the last 168 hours. No results for input(s): "AMMONIA" in the last 168 hours. CBC: Recent Labs  Lab 11/20/21 2047 11/21/21 0640 11/21/21 1444 11/21/21 2109 11/22/21 0541 11/22/21 1729 11/23/21 0429  WBC 11.1* 8.4  --   --  6.4  --  7.4  HGB 7.8* 6.1* 7.1* 8.3* 8.6* 8.6* 8.5*  HCT 24.1* 19.2* 21.0* 25.6* 27.3* 27.3* 26.4*  MCV 101.3* 103.8*  --   --  100.7*  --  97.4  PLT 265 191  --   --  151  --  160   Cardiac Enzymes: No results for input(s): "CKTOTAL", "CKMB", "CKMBINDEX", "TROPONINI" in the last 168 hours. BNP: Invalid input(s): "POCBNP" CBG: Recent Labs  Lab 11/22/21 1639 11/22/21 2002 11/22/21 2328 11/23/21 0400 11/23/21 0716  GLUCAP 223* 259* 165* 165* 133*   D-Dimer No results for input(s): "DDIMER" in the last  72 hours. Hgb A1c Recent Labs    11/21/21 0838 11/23/21 0943  HGBA1C 8.1* 7.2*   Lipid Profile No results for input(s): "CHOL", "HDL", "LDLCALC", "TRIG", "CHOLHDL", "LDLDIRECT" in the last 72 hours. Thyroid function studies No results for input(s): "TSH", "T4TOTAL", "T3FREE", "THYROIDAB" in the last 72 hours.  Invalid input(s): "FREET3" Anemia work up Recent Labs    11/21/21 0838  VITAMINB12 246  FERRITIN 13*  TIBC 327  IRON 22*   Urinalysis    Component Value Date/Time   COLORURINE COLORLESS (A) 11/20/2021 2314   APPEARANCEUR CLEAR 11/20/2021 2314   LABSPEC 1.021 11/20/2021 2314   PHURINE 5.0 11/20/2021 2314   GLUCOSEU >1,000 (A) 11/20/2021 2314   HGBUR NEGATIVE 11/20/2021 2314   BILIRUBINUR NEGATIVE 11/20/2021 2314   BILIRUBINUR NEG 07/04/2019 1048   KETONESUR TRACE (A) 11/20/2021 2314   PROTEINUR NEGATIVE 11/20/2021 2314   UROBILINOGEN 0.2 07/04/2019 1048   NITRITE NEGATIVE 11/20/2021 2314   LEUKOCYTESUR NEGATIVE 11/20/2021 2314   Sepsis Labs Recent Labs  Lab 11/20/21 2047 11/21/21 0640 11/22/21 0541 11/23/21 0429  WBC 11.1* 8.4 6.4 7.4   Microbiology No results found for this or any previous visit (from the past 240 hour(s)).   Time coordinating discharge: Over 30 minutes  SIGNED:   Shawna Clamp, MD  [facility] 11/23/2021, 3:25 PM Pager   If 7PM-7AM, please contact night-coverage

## 2021-11-23 NOTE — Telephone Encounter (Signed)
The pt will be advised of the appt with Colleen on 01/04/22 at 2 pm at discharge and via My Chart.

## 2021-11-23 NOTE — Discharge Instructions (Signed)
Advised to follow-up with primary care physician in 1 week. Advised to follow-up with gastroenterology Dr. Rush Landmark in 4 weeks. Advised to avoid NSAIDs(ibuprofen, Aleve,  Motrin, naproxen, diclofenac) for 6 months or longer Advised to take pantoprazole 40 mg twice daily for next few months Advised to take iron supplements twice daily daily.

## 2021-11-23 NOTE — Telephone Encounter (Signed)
-----   Message from Irving Copas., MD sent at 11/22/2021  9:20 AM EDT ----- Regarding: Follow-up Daryl Webb, This patient will need follow-up with me or with one of the APP's in 6 to 8 weeks. At clinic we will discuss repeat EGD as well as potential colonoscopy. He will likely be discharged on Monday or early this week. Have him come into the [place] office for CBC 1 week after discharge. Thanks. GM

## 2021-11-23 NOTE — Plan of Care (Signed)

## 2021-11-23 NOTE — Progress Notes (Signed)
  Transition of Care [facility]) Screening Note   Patient Details  Name: MORRIS LONGENECKER Date of Birth: 28-Feb-1945   Transition of Care [facility]) CM/SW Contact:    Vassie Moselle, LCSW Phone Number: 11/23/2021, 10:43 AM    Transition of Care Department [facility]) has reviewed patient and no TOC needs have been identified at this time. We will continue to monitor patient advancement through interdisciplinary progression rounds. If new patient transition needs arise, please place a TOC consult.

## 2021-11-23 NOTE — Progress Notes (Signed)
Progress Note  Primary GI: Dr. Rush Landmark ( previously Dr. [name])   Subjective  Chief Complaint: Acute anemia, dark stools  Wife in room with the patient answer simple questions. Patient lying in bed, denies any nausea or vomiting.  Had 1 loose bowel movement yesterday evening small-volume.   Objective   Vital signs in last 24 hours: Temp:  [97.7 F (36.5 C)-98.2 F (36.8 C)] 98.2 F (36.8 C) (10/16 0404) Pulse Rate:  [65-74] 65 (10/16 0404) Resp:  [18] 18 (10/16 0404) BP: (131-138)/(66-80) 138/66 (10/16 0404) SpO2:  [98 %-100 %] 98 % (10/16 0404) Last BM Date : 11/22/21 Last BM recorded by nurses in past 5 days Stool Type: Type 7 (Liquid consistency with no solid pieces) (11/21/2021  8:30 AM)  General:   male in no acute distress  Heart:  Regular rate and rhythm; no murmurs Pulm: Clear anteriorly; no wheezing Abdomen:  Soft, Obese AB, Active bowel sounds. No tenderness . , No organomegaly appreciated. Extremities:  without  edema. Neurologic:  Alert and  oriented x4;  No focal deficits.  Psych:  Cooperative. Normal mood and affect.  Intake/Output from previous day: 10/15 0701 - 10/16 0700 In: 498.3 [I.V.:214.4; IV Piggyback:283.9] Out: -  Intake/Output this shift: No intake/output data recorded.  Studies/Results: No results found.  Lab Results: Recent Labs    11/21/21 0640 11/21/21 1444 11/22/21 0541 11/22/21 1729 11/23/21 0429  WBC 8.4  --  6.4  --  7.4  HGB 6.1*   < > 8.6* 8.6* 8.5*  HCT 19.2*   < > 27.3* 27.3* 26.4*  PLT 191  --  151  --  160   < > = values in this interval not displayed.   BMET Recent Labs    11/21/21 0640 11/21/21 1444 11/22/21 0541 11/23/21 0429  NA 136 139 141 140  K 4.3 4.1 4.5 3.7  CL 108 109 113* 114*  CO2 21*  --  22 21*  GLUCOSE 264* 189* 157* 165*  BUN 56* 41* 38* 26*  CREATININE 1.84* 1.80* 1.68* 1.57*  CALCIUM 8.6*  --  8.6* 8.4*   LFT No results for input(s): "PROT", "ALBUMIN", "AST", "ALT", "ALKPHOS",  "BILITOT", "BILIDIR", "IBILI" in the last 72 hours. PT/INR No results for input(s): "LABPROT", "INR" in the last 72 hours.   Scheduled Meds:  sodium chloride   Intravenous Once   [START ON 11/30/2021] ferrous gluconate  324 mg Oral Q breakfast   insulin aspart  0-9 Units Subcutaneous Q4H   pantoprazole  40 mg Oral BID AC   Continuous Infusions:  ferric gluconate (FERRLECIT) IVPB Stopped (11/22/21 1900)   pantoprazole 8 mg/hr (11/23/21 0320)      Patient profile:   76 year old male with history of chronic renal insufficiency, hypertension, hyperlipidemia, diabetes, aortic stenosis status post TAVR, CAD, previous prostate cancer, history of peptic ulcer disease presents hospital acute anemia and dark stools.   Impression/Plan:   Melena with anemia Status post 2 units packed red blood cells, IV iron 10/15 Iron supplementation 1 week after discharge 11/21/2021 EGD with Dr. [name] showed normal esophagus, normal stomach, nonbleeding duodenal ulcer Protonix drip 48 hours at 12 today, will plan on switching to twice daily PPI outpatient 40 mg pantoprazole. Outpatient follow-up 6 to 8 weeks-has appointment [facility] 11/27 at 2 PM, will need repeat EGD 3 to 4 months for follow-up of ulcer healing and discuss possible colonoscopy at that same time since patient's last one was 11 years ago.  Scheduled for  right inguinal hernia surgery 10 days Discussion with Dr. Rush Landmark and general surgeon Would advise patient not to use any NSAIDs for pain management Consider opioid use or other forms such as blocks after surgery. Would also suggesting checking CBC in 5-7 days to assure stability and increasing.  Start iron around that time as well.  Principal Problem:   GI bleeding Active Problems:   Type 2 diabetes mellitus with renal manifestations (HCC)   Dyspnea   Acute blood loss anemia   Duodenal ulcer with hemorrhage   Essential hypertension   CKD (chronic kidney disease), stage III  (HCC)   Confusion   GI bleed    LOS: 2 days   Vladimir Crofts  11/23/2021, 10:34 AM

## 2021-11-24 ENCOUNTER — Encounter (HOSPITAL_COMMUNITY): Payer: Self-pay | Admitting: Gastroenterology

## 2021-11-24 ENCOUNTER — Telehealth: Payer: Self-pay

## 2021-11-24 NOTE — Progress Notes (Signed)
Anesthesia Chart Review   Case: [id number] Date/Time: 12/03/21 0845   Procedure: RIGHT OPEN INGUINAL HERNIA REPAIR WITH MESH (Right)   Anesthesia type: General   Pre-op diagnosis: RIGHT INGUINAL HERNIA   Location: [facility] / [facility]   Surgeons: Kinsinger, Arta Bruce, MD       DISCUSSION:76 y.o. former smoker with h/o HTN, DM II, CKD III, s/p TAVR 08/12/2020, right inguinal hernia scheduled for above procedure 12/03/2021 with Daryl. Gurney Webb.   Pt with recent admission 10/13-10/16/2023 with acute GI bleed, found to have non bleeding duodenal ulcer on EGD. Pt with PCP follow up 10/23 VS: There were no vitals taken for this visit.  PROVIDERS: [name], [name], MD is PCP   Cardiologist - Daryl Bishop, MD LABS: Labs reviewed: Acceptable for surgery. (all labs ordered are listed, but only abnormal results are displayed)  Labs Reviewed  HEMOGLOBIN A1C - Abnormal; Notable for the following components:      Result Value   Hgb A1c MFr Bld 7.2 (*)    All other components within normal limits     IMAGES:   EKG:   CV: Echo 07/01/2021  1. Left ventricular ejection fraction, by estimation, is 55 to 60%. Left  ventricular ejection fraction by 3D volume is 63 %. The left ventricle has  normal function. The left ventricle has no regional wall motion  abnormalities. There is mild concentric  left ventricular hypertrophy. Left ventricular diastolic parameters are  consistent with Grade I diastolic dysfunction (impaired relaxation).   2. Right ventricular systolic function is normal. The right ventricular  size is normal. Tricuspid regurgitation signal is inadequate for assessing  PA pressure.   3. Left atrial size was moderately dilated.   4. Right atrial size was mildly dilated.   5. The mitral valve is normal in structure. Trivial mitral valve  regurgitation.   6. The aortic valve has been repaired/replaced. Aortic valve  regurgitation is not visualized. There is a valve present  in the aortic  position. Echo findings are consistent with normal structure and function  of the aortic valve prosthesis. Aortic valve  mean gradient measures 9.2 mmHg. Aortic valve Vmax measures 2.07 m/s.  Aortic valve acceleration time measures 66 msec.   Myocardial Perfusion 04/18/2019 The left ventricular ejection fraction is moderately decreased (30-44%). Nuclear stress EF is calculated at 41% but appears higher at around 50-55%. Baseline EKG showed T wave inversions in the inferolateral leads with no change from baseline EKG during infusion. The perfusion study is normal. This is a low risk study. Past Medical History:  Diagnosis Date   Anemia    Arthritis    CKD (chronic kidney disease)    Constipation due to pain medication    needs stool softner while on pain medication   Diabetes mellitus    Duodenal ulcer 08/13/2015   2014  - Daryl Webb/GI   GERD (gastroesophageal reflux disease)    H/O: osteoarthritis    History of kidney stones    History of prostate cancer    History of urethral stricture    Hypercholesterolemia    Hypertension    Prostate cancer ([place])    prostate   S/P TAVR (transcatheter aortic valve replacement) 08/12/2020   s/p TAVR with a 29 mm Edwards Sapien 3 THV via the TF approach by Daryl. Burt Webb and Daryl. Cyndia Webb   Severe aortic stenosis    Spinal stenosis    Ulcer duodenal hemorrhage     Past Surgical History:  Procedure  Laterality Date   ARTHROPLASTY  2004   right knee Daryl. Jeanie Webb SURGERY     CARDIAC CATHETERIZATION     CARPAL TUNNEL RELEASE     right hand,wrist Daryl. Daylene Webb   CYSTOSCOPY N/A 09/20/2012   Procedure: CYSTOSCOPY;  Surgeon: Daryl Gray, MD;  Location: [place] [facility] ORS;  Service: Urology;  Laterality: N/A;   JOINT REPLACEMENT Right    KNEE ARTHROSCOPY Right    LUMBAR LAMINECTOMY  02/08/2013   PROSTATECTOMY     radical   RIGHT/LEFT HEART CATH AND CORONARY ANGIOGRAPHY N/A 05/08/2020   Procedure: RIGHT/LEFT HEART CATH AND CORONARY  ANGIOGRAPHY;  Surgeon: Daryl Mocha, MD;  Location: [place] CV LAB;  Service: Cardiovascular;  Laterality: N/A;   TEE WITHOUT CARDIOVERSION N/A 08/12/2020   Procedure: TRANSESOPHAGEAL ECHOCARDIOGRAM (TEE);  Surgeon: Daryl Mocha, MD;  Location: [place] CV LAB;  Service: Open Heart Surgery;  Laterality: N/A;   TRANSCATHETER AORTIC VALVE REPLACEMENT, TRANSFEMORAL N/A 08/12/2020   Procedure: TRANSCATHETER AORTIC VALVE REPLACEMENT, TRANSFEMORAL;  Surgeon: Daryl Mocha, MD;  Location: [place] CV LAB;  Service: Open Heart Surgery;  Laterality: N/A;   VASECTOMY  1985    MEDICATIONS:  acetaminophen (TYLENOL) 325 MG tablet   amLODipine (NORVASC) 5 MG tablet   amoxicillin (AMOXIL) 500 MG capsule   Apoaequorin (PREVAGEN PO)   aspirin 81 MG tablet   atorvastatin (LIPITOR) 40 MG tablet   calcium carbonate (TUMS - DOSED IN MG ELEMENTAL CALCIUM) 500 MG chewable tablet   Coenzyme Q10 (COQ10) 100 MG CAPS   cyanocobalamin (VITAMIN B12) 1000 MCG/ML injection   dapagliflozin propanediol (FARXIGA) 10 MG TABS tablet   diphenhydrAMINE HCl, Sleep, (ZZZQUIL) 25 MG CAPS   [START ON 11/30/2021] ferrous gluconate (FERGON) 324 MG tablet   losartan-hydrochlorothiazide (HYZAAR) 50-12.5 MG tablet   metoprolol succinate (TOPROL-XL) 50 MG 24 hr tablet   nystatin ointment (MYCOSTATIN)   pantoprazole (PROTONIX) 40 MG tablet   No current facility-administered medications for this encounter.    Daryl Felix Ward, PA-C [facility]Surgical [facility] [phone]

## 2021-11-24 NOTE — Telephone Encounter (Signed)
Transition Care Management Follow-up Telephone Call Date of discharge and from where: 11/23/2021 [facility] How have you been since you were released from the hospital? Feels good Any questions or concerns? Yes  Items Reviewed: Did the pt receive and understand the discharge instructions provided? Yes  Medications obtained and verified? Yes  Other? No  Any new allergies since your discharge? No  Dietary orders reviewed? Yes Do you have support at home? Yes   Home Care and Equipment/Supplies: Were home health services ordered? not applicable If so, what is the name of the agency? N/a  Has the agency set up a time to come to the patient's home? not applicable Were any new equipment or medical supplies ordered?  No What is the name of the medical supply agency? N/a Were you able to get the supplies/equipment? not applicable Do you have any questions related to the use of the equipment or supplies? No  Functional Questionnaire: (I = Independent and D = Dependent) ADLs: I  Bathing/Dressing- I  Meal Prep- I  Eating- I  Maintaining continence- I  Transferring/Ambulation- I  Managing Meds- I  Follow up appointments reviewed:  PCP Hospital f/u appt confirmed? Yes  Scheduled to see Dr. [name] on 11/30/2021 @ 11:30. Are transportation arrangements needed? No  If their condition worsens, is the pt aware to call PCP or go to the Emergency Dept.? Yes Was the patient provided with contact information for the PCP's office or ED? Yes Was to pt encouraged to call back with questions or concerns? Yes

## 2021-11-25 LAB — SURGICAL PATHOLOGY

## 2021-11-26 DIAGNOSIS — Z23 Encounter for immunization: Secondary | ICD-10-CM | POA: Diagnosis not present

## 2021-11-27 NOTE — Progress Notes (Deleted)
HPI:  Mr.Ossiel R Fromer is a 76 y.o. male, who is here today to follow on recent hospital visit.  Review of Systems Rest see pertinent positives and negatives per HPI.  Current Outpatient Medications on File Prior to Visit  Medication Sig Dispense Refill   acetaminophen (TYLENOL) 325 MG tablet Take 650 mg by mouth every 6 (six) hours as needed for moderate pain.     amLODipine (NORVASC) 5 MG tablet Take 1 tablet (5 mg total) by mouth daily. 90 tablet 2   amoxicillin (AMOXIL) 500 MG capsule Take 4 capsules (2,000 mg total) by mouth See admin instructions. Take 2000 mg 1 hour prior to dental procedures 12 capsule 2   Apoaequorin (PREVAGEN PO) Take 1 capsule by mouth daily.     aspirin 81 MG tablet Take 81 mg by mouth daily.     atorvastatin (LIPITOR) 40 MG tablet Take 1 tablet (40 mg total) by mouth daily. 90 tablet 3   calcium carbonate (TUMS - DOSED IN MG ELEMENTAL CALCIUM) 500 MG chewable tablet Chew 2 tablets by mouth daily as needed for indigestion or heartburn.     Coenzyme Q10 (COQ10) 100 MG CAPS Take 100 mg by mouth daily.     cyanocobalamin (VITAMIN B12) 1000 MCG/ML injection Inject 1,000 mcg into the muscle every 30 (thirty) days.     dapagliflozin propanediol (FARXIGA) 10 MG TABS tablet Take 10 mg by mouth daily.     diphenhydrAMINE HCl, Sleep, (ZZZQUIL) 25 MG CAPS Take 25 mg by mouth at bedtime.     [START ON 11/30/2021] ferrous gluconate (FERGON) 324 MG tablet Take 1 tablet (324 mg total) by mouth daily with breakfast. 30 tablet 3   losartan-hydrochlorothiazide (HYZAAR) 50-12.5 MG tablet TAKE 1 TABLET DAILY (Patient taking differently: Take 1 tablet by mouth daily.) 90 tablet 1   metoprolol succinate (TOPROL-XL) 50 MG 24 hr tablet TAKE 1 TABLET DAILY (Patient taking differently: Take 50 mg by mouth daily.) 90 tablet 2   nystatin ointment (MYCOSTATIN) Apply 1 application topically 3 (three) times daily. (Patient taking differently: Apply 1 application  topically 3 (three)  times daily as needed (jock itch).) 30 g 0   pantoprazole (PROTONIX) 40 MG tablet Take 1 tablet (40 mg total) by mouth 2 (two) times daily before a meal. 60 tablet 2   No current facility-administered medications on file prior to visit.    Past Medical History:  Diagnosis Date   Anemia    Arthritis    CKD (chronic kidney disease)    Constipation due to pain medication    needs stool softner while on pain medication   Diabetes mellitus    Duodenal ulcer 08/13/2015   2014  - Dr Kaplan/GI   GERD (gastroesophageal reflux disease)    H/O: osteoarthritis    History of kidney stones    History of prostate cancer    History of urethral stricture    Hypercholesterolemia    Hypertension    Prostate cancer ([place])    prostate   S/P TAVR (transcatheter aortic valve replacement) 08/12/2020   s/p TAVR with a 29 mm Edwards Sapien 3 THV via the TF approach by [name] and [name]   Severe aortic stenosis    Spinal stenosis    Ulcer duodenal hemorrhage    No Known Allergies  Social History   Socioeconomic History   Marital status: Married    Spouse name: Not on file   Number of children: 2   Years of  education: 16   Highest education level: Bachelor's degree (e.g., BA, AB, BS)  Occupational History   Occupation: Retired  Tobacco Use   Smoking status: Former    Packs/day: 1.50    Years: 20.00    Total pack years: 30.00    Types: Cigarettes    Quit date: 02/09/1980    Years since quitting: 41.8   Smokeless tobacco: Never  Vaping Use   Vaping Use: Never used  Substance and Sexual Activity   Alcohol use: No   Drug use: No   Sexual activity: Not Currently  Other Topics Concern   Not on file  Social History Narrative   Retired from Metallurgist (boats).    Lives with wife,    two children in [place].    Enjoys reading, boating, travelling to [place] in the winter with wife.   Social Determinants of Health   Financial Resource Strain: Low Risk  (12/19/2020)   Overall  Financial Resource Strain (CARDIA)    Difficulty of Paying Living Expenses: Not hard at all  Food Insecurity: No Food Insecurity (11/21/2021)   Hunger Vital Sign    Worried About Running Out of Food in the Last Year: Never true    Ran Out of Food in the Last Year: Never true  Transportation Needs: No Transportation Needs (11/21/2021)   PRAPARE - Hydrologist (Medical): No    Lack of Transportation (Non-Medical): No  Physical Activity: Sufficiently Active (12/19/2020)   Exercise Vital Sign    Days of Exercise per Week: 3 days    Minutes of Exercise per Session: 60 min  Stress: No Stress Concern Present (12/19/2020)   [place]    Feeling of Stress : Not at all  Social Connections: Socially Isolated (12/19/2020)   Social Connection and Isolation Panel [NHANES]    Frequency of Communication with Friends and Family: Once a week    Frequency of Social Gatherings with Friends and Family: Once a week    Attends Religious Services: Never    Marine scientist or Organizations: No    Attends Archivist Meetings: Never    Marital Status: Married    There were no vitals filed for this visit. There is no height or weight on file to calculate BMI.  Physical Exam  ASSESSMENT AND PLAN:   There are no diagnoses linked to this encounter.  No orders of the defined types were placed in this encounter.   No problem-specific Assessment & Plan notes found for this encounter.   No follow-ups on file.   [name]. [name], MD  [facility]. [place] office.

## 2021-11-28 ENCOUNTER — Telehealth: Payer: Self-pay | Admitting: Gastroenterology

## 2021-11-28 NOTE — Telephone Encounter (Signed)
Pts wife calling reporting patient has been fatigued since discharge from his recent hospitalization for a bleeding DU. He had dark stool today that was not black or tarry. He vomited food today without blood or coffee grounds. No abdominal pain. She reports his blood glucose has not been well controlled the past few days - in the 250-300 range. She relates his BP and pulse are stable c/w his typical readings.  Offered ED evaluation or observation at home for signs, symptoms of bleeding. She opts for observation at home on a light diet. Advised ED evaluation urgently if signs of bleeding develop, N/V persists or he continues to not feel well. Advised her to contact his endocrinologist or PCP regarding blood glucose elevation.

## 2021-11-29 ENCOUNTER — Encounter (HOSPITAL_COMMUNITY): Payer: Self-pay

## 2021-11-29 ENCOUNTER — Inpatient Hospital Stay (HOSPITAL_BASED_OUTPATIENT_CLINIC_OR_DEPARTMENT_OTHER)
Admission: EM | Admit: 2021-11-29 | Discharge: 2021-12-03 | DRG: 378 | Disposition: A | Discharge status: Home / Self Care | Payer: Medicare Other | Attending: Internal Medicine | Admitting: Internal Medicine

## 2021-11-29 ENCOUNTER — Other Ambulatory Visit: Payer: Self-pay

## 2021-11-29 ENCOUNTER — Encounter (HOSPITAL_BASED_OUTPATIENT_CLINIC_OR_DEPARTMENT_OTHER): Payer: Self-pay | Admitting: Emergency Medicine

## 2021-11-29 DIAGNOSIS — N183 Chronic kidney disease, stage 3 unspecified: Secondary | ICD-10-CM | POA: Diagnosis present

## 2021-11-29 DIAGNOSIS — K274 Chronic or unspecified peptic ulcer, site unspecified, with hemorrhage: Secondary | ICD-10-CM | POA: Diagnosis not present

## 2021-11-29 DIAGNOSIS — E1122 Type 2 diabetes mellitus with diabetic chronic kidney disease: Secondary | ICD-10-CM | POA: Diagnosis present

## 2021-11-29 DIAGNOSIS — Z79899 Other long term (current) drug therapy: Secondary | ICD-10-CM | POA: Diagnosis not present

## 2021-11-29 DIAGNOSIS — E8809 Other disorders of plasma-protein metabolism, not elsewhere classified: Secondary | ICD-10-CM | POA: Diagnosis present

## 2021-11-29 DIAGNOSIS — N189 Chronic kidney disease, unspecified: Secondary | ICD-10-CM | POA: Diagnosis not present

## 2021-11-29 DIAGNOSIS — Z87891 Personal history of nicotine dependence: Secondary | ICD-10-CM | POA: Diagnosis not present

## 2021-11-29 DIAGNOSIS — K269 Duodenal ulcer, unspecified as acute or chronic, without hemorrhage or perforation: Secondary | ICD-10-CM | POA: Diagnosis not present

## 2021-11-29 DIAGNOSIS — I10 Essential (primary) hypertension: Secondary | ICD-10-CM | POA: Diagnosis not present

## 2021-11-29 DIAGNOSIS — E782 Mixed hyperlipidemia: Secondary | ICD-10-CM | POA: Diagnosis present

## 2021-11-29 DIAGNOSIS — E538 Deficiency of other specified B group vitamins: Secondary | ICD-10-CM | POA: Diagnosis not present

## 2021-11-29 DIAGNOSIS — K264 Chronic or unspecified duodenal ulcer with hemorrhage: Principal | ICD-10-CM | POA: Diagnosis present

## 2021-11-29 DIAGNOSIS — E872 Acidosis, unspecified: Secondary | ICD-10-CM | POA: Diagnosis present

## 2021-11-29 DIAGNOSIS — Z952 Presence of prosthetic heart valve: Secondary | ICD-10-CM

## 2021-11-29 DIAGNOSIS — D649 Anemia, unspecified: Secondary | ICD-10-CM

## 2021-11-29 DIAGNOSIS — E785 Hyperlipidemia, unspecified: Secondary | ICD-10-CM | POA: Diagnosis not present

## 2021-11-29 DIAGNOSIS — D62 Acute posthemorrhagic anemia: Secondary | ICD-10-CM | POA: Diagnosis present

## 2021-11-29 DIAGNOSIS — Z8249 Family history of ischemic heart disease and other diseases of the circulatory system: Secondary | ICD-10-CM | POA: Diagnosis not present

## 2021-11-29 DIAGNOSIS — K279 Peptic ulcer, site unspecified, unspecified as acute or chronic, without hemorrhage or perforation: Secondary | ICD-10-CM | POA: Diagnosis not present

## 2021-11-29 DIAGNOSIS — Z7984 Long term (current) use of oral hypoglycemic drugs: Secondary | ICD-10-CM

## 2021-11-29 DIAGNOSIS — E7800 Pure hypercholesterolemia, unspecified: Secondary | ICD-10-CM | POA: Diagnosis not present

## 2021-11-29 DIAGNOSIS — Z96651 Presence of right artificial knee joint: Secondary | ICD-10-CM | POA: Diagnosis present

## 2021-11-29 DIAGNOSIS — K921 Melena: Secondary | ICD-10-CM

## 2021-11-29 DIAGNOSIS — N1832 Chronic kidney disease, stage 3b: Secondary | ICD-10-CM | POA: Diagnosis present

## 2021-11-29 DIAGNOSIS — K219 Gastro-esophageal reflux disease without esophagitis: Secondary | ICD-10-CM | POA: Diagnosis present

## 2021-11-29 DIAGNOSIS — N179 Acute kidney failure, unspecified: Secondary | ICD-10-CM | POA: Diagnosis present

## 2021-11-29 DIAGNOSIS — K259 Gastric ulcer, unspecified as acute or chronic, without hemorrhage or perforation: Secondary | ICD-10-CM | POA: Diagnosis present

## 2021-11-29 DIAGNOSIS — K922 Gastrointestinal hemorrhage, unspecified: Secondary | ICD-10-CM | POA: Diagnosis present

## 2021-11-29 DIAGNOSIS — Z7982 Long term (current) use of aspirin: Secondary | ICD-10-CM | POA: Diagnosis not present

## 2021-11-29 DIAGNOSIS — I129 Hypertensive chronic kidney disease with stage 1 through stage 4 chronic kidney disease, or unspecified chronic kidney disease: Secondary | ICD-10-CM | POA: Diagnosis not present

## 2021-11-29 DIAGNOSIS — Z833 Family history of diabetes mellitus: Secondary | ICD-10-CM

## 2021-11-29 DIAGNOSIS — Z953 Presence of xenogenic heart valve: Secondary | ICD-10-CM | POA: Diagnosis not present

## 2021-11-29 DIAGNOSIS — Z8546 Personal history of malignant neoplasm of prostate: Secondary | ICD-10-CM

## 2021-11-29 DIAGNOSIS — D631 Anemia in chronic kidney disease: Secondary | ICD-10-CM | POA: Diagnosis not present

## 2021-11-29 DIAGNOSIS — D500 Iron deficiency anemia secondary to blood loss (chronic): Secondary | ICD-10-CM | POA: Diagnosis not present

## 2021-11-29 DIAGNOSIS — E1129 Type 2 diabetes mellitus with other diabetic kidney complication: Secondary | ICD-10-CM | POA: Diagnosis present

## 2021-11-29 LAB — CBC
HCT: 22.4 % — ABNORMAL LOW (ref 39.0–52.0)
HCT: 21.4 % — ABNORMAL LOW (ref 39.0–52.0)
Hemoglobin: 7.1 g/dL — ABNORMAL LOW (ref 13.0–17.0)
Hemoglobin: 6.6 g/dL — CL (ref 13.0–17.0)
MCH: 30.6 pg (ref 26.0–34.0)
MCH: 31.1 pg (ref 26.0–34.0)
MCHC: 31.7 g/dL (ref 30.0–36.0)
MCHC: 30.8 g/dL (ref 30.0–36.0)
MCV: 96.6 fL (ref 80.0–100.0)
MCV: 100.9 fL — ABNORMAL HIGH (ref 80.0–100.0)
Platelets: 303 10*3/uL (ref 150–400)
Platelets: 293 10*3/uL (ref 150–400)
RBC: 2.32 MIL/uL — ABNORMAL LOW (ref 4.22–5.81)
RBC: 2.12 MIL/uL — ABNORMAL LOW (ref 4.22–5.81)
RDW: 17.5 % — ABNORMAL HIGH (ref 11.5–15.5)
RDW: 17.3 % — ABNORMAL HIGH (ref 11.5–15.5)
WBC: 6.7 10*3/uL (ref 4.0–10.5)
WBC: 8.3 10*3/uL (ref 4.0–10.5)
nRBC: 0 % (ref 0.0–0.2)
nRBC: 0 % (ref 0.0–0.2)

## 2021-11-29 LAB — COMPREHENSIVE METABOLIC PANEL
ALT: 10 U/L (ref 0–44)
AST: 8 U/L — ABNORMAL LOW (ref 15–41)
Albumin: 3.5 g/dL (ref 3.5–5.0)
Alkaline Phosphatase: 58 U/L (ref 38–126)
Anion gap: 13 (ref 5–15)
BUN: 90 mg/dL — ABNORMAL HIGH (ref 8–23)
CO2: 18 mmol/L — ABNORMAL LOW (ref 22–32)
Calcium: 9.2 mg/dL (ref 8.9–10.3)
Chloride: 103 mmol/L (ref 98–111)
Creatinine, Ser: 1.8 mg/dL — ABNORMAL HIGH (ref 0.61–1.24)
GFR, Estimated: 39 mL/min — ABNORMAL LOW (ref >60)
Glucose, Bld: 368 mg/dL — ABNORMAL HIGH (ref 70–99)
Potassium: 4.2 mmol/L (ref 3.5–5.1)
Sodium: 134 mmol/L — ABNORMAL LOW (ref 135–145)
Total Bilirubin: 0.3 mg/dL (ref 0.3–1.2)
Total Protein: 5.5 g/dL — ABNORMAL LOW (ref 6.5–8.1)

## 2021-11-29 LAB — PREPARE RBC (CROSSMATCH)

## 2021-11-29 LAB — GLUCOSE, CAPILLARY
Glucose-Capillary: 193 mg/dL — ABNORMAL HIGH (ref 70–99)
Glucose-Capillary: 205 mg/dL — ABNORMAL HIGH (ref 70–99)

## 2021-11-29 LAB — MRSA NEXT GEN BY PCR, NASAL: MRSA by PCR Next Gen: NOT DETECTED

## 2021-11-29 MED ORDER — SODIUM CHLORIDE 0.9 % IV BOLUS
1000.0000 mL | Freq: Once | INTRAVENOUS | Status: AC
  Administered 2021-11-29: 1000 mL via INTRAVENOUS

## 2021-11-29 MED ORDER — ATORVASTATIN CALCIUM 40 MG PO TABS
40.0000 mg | ORAL_TABLET | Freq: Every day | ORAL | Status: DC
  Administered 2021-11-30 – 2021-12-03 (×4): 40 mg via ORAL
  Filled 2021-11-29 (×4): qty 1

## 2021-11-29 MED ORDER — SODIUM CHLORIDE 0.9% IV SOLUTION: Freq: Once | INTRAVENOUS | Status: DC

## 2021-11-29 MED ORDER — ORAL CARE MOUTH RINSE: 15.0000 mL | OROMUCOSAL | Status: DC | PRN

## 2021-11-29 MED ORDER — SODIUM CHLORIDE 0.9% FLUSH
3.0000 mL | Freq: Two times a day (BID) | INTRAVENOUS | Status: DC
  Administered 2021-11-29 – 2021-12-02 (×6): 3 mL via INTRAVENOUS

## 2021-11-29 MED ORDER — ACETAMINOPHEN 650 MG RE SUPP: 650.0000 mg | Freq: Four times a day (QID) | RECTAL | Status: DC | PRN

## 2021-11-29 MED ORDER — INSULIN ASPART 100 UNIT/ML IJ SOLN
0.0000 [IU] | INTRAMUSCULAR | Status: DC
  Administered 2021-11-29 – 2021-11-30 (×3): 3 [IU] via SUBCUTANEOUS
  Administered 2021-11-30 (×3): 2 [IU] via SUBCUTANEOUS
  Administered 2021-12-01: 9 [IU] via SUBCUTANEOUS
  Administered 2021-12-01: 1 [IU] via SUBCUTANEOUS
  Administered 2021-12-01: 5 [IU] via SUBCUTANEOUS
  Administered 2021-12-01 (×2): 2 [IU] via SUBCUTANEOUS
  Administered 2021-12-01: 7 [IU] via SUBCUTANEOUS
  Administered 2021-12-02: 2 [IU] via SUBCUTANEOUS
  Administered 2021-12-02 (×2): 1 [IU] via SUBCUTANEOUS

## 2021-11-29 MED ORDER — PANTOPRAZOLE SODIUM 40 MG IV SOLR
INTRAVENOUS | Status: AC
  Filled 2021-11-29: qty 40

## 2021-11-29 MED ORDER — PANTOPRAZOLE 80MG IVPB - SIMPLE MED
80.0000 mg | Freq: Once | INTRAVENOUS | Status: AC
  Administered 2021-11-29: 80 mg via INTRAVENOUS
  Filled 2021-11-29: qty 100

## 2021-11-29 MED ORDER — PANTOPRAZOLE INFUSION (NEW) - SIMPLE MED
8.0000 mg/h | INTRAVENOUS | Status: AC
  Administered 2021-11-29 – 2021-12-02 (×6): 8 mg/h via INTRAVENOUS
  Filled 2021-11-29 (×2): qty 100
  Filled 2021-11-29 (×3): qty 80
  Filled 2021-11-29: qty 100
  Filled 2021-11-29 (×2): qty 80
  Filled 2021-11-29 (×3): qty 100

## 2021-11-29 MED ORDER — ACETAMINOPHEN 325 MG PO TABS: 650.0000 mg | ORAL_TABLET | Freq: Four times a day (QID) | ORAL | Status: DC | PRN

## 2021-11-29 MED ORDER — INSULIN ASPART 100 UNIT/ML IJ SOLN: 0.0000 [IU] | INTRAMUSCULAR | Status: DC

## 2021-11-29 MED ORDER — CHLORHEXIDINE GLUCONATE CLOTH 2 % EX PADS
6.0000 | MEDICATED_PAD | Freq: Every day | CUTANEOUS | Status: DC
  Administered 2021-11-29 – 2021-11-30 (×2): 6 via TOPICAL

## 2021-11-29 MED ORDER — PANTOPRAZOLE SODIUM 40 MG IV SOLR
40.0000 mg | Freq: Two times a day (BID) | INTRAVENOUS | Status: DC
  Administered 2021-12-03 (×2): 40 mg via INTRAVENOUS
  Filled 2021-11-29 (×2): qty 10

## 2021-11-29 NOTE — H&P (Addendum)
History and Physical   Daryl Webb BSJ:[id number] DOB: 1945/10/03 DOA: 11/29/2021  PCP: [name], [name], MD   Patient coming from: Home  Chief Complaint: Dark stools, shortness of breath  HPI: Daryl Webb is a 76 y.o. male with medical history significant of hyperlipidemia, prostate cancer, diabetes, anemia, GI bleeding, peptic ulcer disease, CKD 3B, aortic stenosis status post TAVR presenting with shortness of breath and dark stools.  Patient was recently admitted from October 13 until October 16 with worsening anemia and GI bleeding.  EGD was done and showed nonbleeding ulcer at the time of examination.  Patient was given couple days of IV PPI and then discharged on p.o. PPI.   Since returning home he is now recently become short of breath and had episode of melena as well as episode of nausea with vomiting but no bloody or coffee-ground like vomitus; states it did appear brown but it appears mostly like the food that he had eaten.  He is also reporting feeling weak today as well.  He denies fevers, chills, chest pain, shortness of breath, abdominal pain, constipation, diarrhea.  ED Course: Vital signs in the ED significant for blood pressure in the 76L 465 systolic.  Lab work-up included BMP with sodium 134, bicarb 18, BUN 90, creatinine near baseline at 1.8, glucose 368.  CBC with hemoglobin of 7.1 down from 8.56 days ago.  Patient received a liter of fluids and was started on IV PPI and then an IV PPI drip.  Gastroenterology was consulted will see the patient tomorrow agree with IV PPI drip.  Review of Systems: As per HPI otherwise all other systems reviewed and are negative.  Past Medical History:  Diagnosis Date   Acute blood loss anemia 06/27/2012   Anemia    Arthritis    CKD (chronic kidney disease)    Constipation due to pain medication    needs stool softner while on pain medication   Diabetes mellitus    Duodenal ulcer 08/13/2015   2014  - Dr Kaplan/GI    Duodenal ulcer with hemorrhage 08/13/2015   2014  - Dr Kaplan/GI   Dyspnea 06/19/2012   GERD (gastroesophageal reflux disease)    H/O: osteoarthritis    History of kidney stones    History of prostate cancer    History of urethral stricture    Hypercholesterolemia    Hypertension    Melena 06/19/2012   Prostate cancer ([place])    prostate   S/P TAVR (transcatheter aortic valve replacement) 08/12/2020   s/p TAVR with a 29 mm Edwards Sapien 3 THV via the TF approach by [name] and [name]   Severe aortic stenosis    Spinal stenosis    Ulcer duodenal hemorrhage     Past Surgical History:  Procedure Laterality Date   ARTHROPLASTY  2004   right knee [name] SURGERY     BIOPSY  11/21/2021   Procedure: BIOPSY;  Surgeon: Doran Stabler, MD;  Location: [facility];  Service: Gastroenterology;;   CARDIAC CATHETERIZATION     CARPAL TUNNEL RELEASE     right hand,wrist [name]   CYSTOSCOPY N/A 09/20/2012   Procedure: Consuela Mimes;  Surgeon: [name], MD;  Location: [facility];  Service: Urology;  Laterality: N/A;   ESOPHAGOGASTRODUODENOSCOPY N/A 11/21/2021   Procedure: ESOPHAGOGASTRODUODENOSCOPY (EGD);  Surgeon: Doran Stabler, MD;  Location: [facility];  Service: Gastroenterology;  Laterality: N/A;   JOINT REPLACEMENT Right    KNEE  ARTHROSCOPY Right    LUMBAR LAMINECTOMY  02/08/2013   PROSTATECTOMY     radical   RIGHT/LEFT HEART CATH AND CORONARY ANGIOGRAPHY N/A 05/08/2020   Procedure: RIGHT/LEFT HEART CATH AND CORONARY ANGIOGRAPHY;  Surgeon: [name], MD;  Location: [facility];  Service: Cardiovascular;  Laterality: N/A;   TEE WITHOUT CARDIOVERSION N/A 08/12/2020   Procedure: TRANSESOPHAGEAL ECHOCARDIOGRAM (TEE);  Surgeon: [name], MD;  Location: [facility];  Service: Open Heart Surgery;  Laterality: N/A;   TRANSCATHETER AORTIC VALVE REPLACEMENT, TRANSFEMORAL N/A 08/12/2020   Procedure: TRANSCATHETER AORTIC VALVE REPLACEMENT,  TRANSFEMORAL;  Surgeon: [name], MD;  Location: [facility];  Service: Open Heart Surgery;  Laterality: N/A;   [facility] [place] [place] History  reports that he quit smoking about 41 years ago. His smoking use included cigarettes. He has a 30.00 pack-year smoking history. He has never used smokeless tobacco. He reports that he does not drink alcohol and does not use drugs.  No Known Allergies  Family History  Problem Relation Age of Onset   Heart disease Mother    Heart failure Mother    Heart disease Father    Diabetes Father   Reviewed on admission  Prior to Admission medications   Medication Sig Start Date End Date Taking? Authorizing Provider  acetaminophen (TYLENOL) 325 MG tablet Take 650 mg by mouth every 6 (six) hours as needed for moderate pain.    [provider]  amLODipine (NORVASC) 5 MG tablet Take 1 tablet (5 mg total) by mouth daily. 08/26/21   Hilty, Nadean Corwin, MD  amoxicillin (AMOXIL) 500 MG capsule Take 4 capsules (2,000 mg total) by mouth See admin instructions. Take 2000 mg 1 hour prior to dental procedures 07/01/21   Eileen Stanford, PA-C  Apoaequorin (PREVAGEN PO) Take 1 capsule by mouth daily.    [provider]  aspirin 81 MG tablet Take 81 mg by mouth daily.    [provider]  atorvastatin (LIPITOR) 40 MG tablet Take 1 tablet (40 mg total) by mouth daily. 08/26/21   Hilty, Nadean Corwin, MD  calcium carbonate (TUMS - DOSED IN MG ELEMENTAL CALCIUM) 500 MG chewable tablet Chew 2 tablets by mouth daily as needed for indigestion or heartburn.    [provider]  Coenzyme Q10 (COQ10) 100 MG CAPS Take 100 mg by mouth daily.    [provider]  cyanocobalamin (VITAMIN B12) 1000 MCG/ML injection Inject 1,000 mcg into the muscle every 30 (thirty) days.    [provider]  dapagliflozin propanediol (FARXIGA) 10 MG TABS tablet Take 10 mg by mouth daily.    [provider]  diphenhydrAMINE HCl,  Sleep, (ZZZQUIL) 25 MG CAPS Take 25 mg by mouth at bedtime.    [provider]  ferrous gluconate (FERGON) 324 MG tablet Take 1 tablet (324 mg total) by mouth daily with breakfast. 11/30/21   Shawna Clamp, MD  losartan-hydrochlorothiazide (HYZAAR) 50-12.5 MG tablet TAKE 1 TABLET DAILY Patient taking differently: Take 1 tablet by mouth daily. 08/31/21   Hilty, Nadean Corwin, MD  metoprolol succinate (TOPROL-XL) 50 MG 24 hr tablet TAKE 1 TABLET DAILY Patient taking differently: Take 50 mg by mouth daily. 04/30/21   Hilty, Nadean Corwin, MD  nystatin ointment (MYCOSTATIN) Apply 1 application topically 3 (three) times daily. Patient taking differently: Apply 1 application  topically 3 (three) times daily as needed (jock itch). 08/01/20   Gaye Pollack, MD  pantoprazole (PROTONIX) 40 MG  tablet Take 1 tablet (40 mg total) by mouth 2 (two) times daily before a meal. 11/23/21   Shawna Clamp, MD    Physical Exam: Vitals:   11/29/21 1334 11/29/21 1500 11/29/21 1555 11/29/21 1900  BP: (!) 106/54 (!) 107/53 (!) 105/55 (!) 138/57  Pulse: 70 71 75 76  Resp: '11 18 17 16  '$ Temp:   97.8 F (36.6 C)   TempSrc:   Oral   SpO2: 100% 98% 100% 95%  Weight:      Height:        Physical Exam Constitutional:      General: He is not in acute distress.    Appearance: Normal appearance.  HENT:     Head: Normocephalic and atraumatic.     Mouth/Throat:     Mouth: Mucous membranes are moist.     Pharynx: Oropharynx is clear.  Eyes:     Extraocular Movements: Extraocular movements intact.     Pupils: Pupils are equal, round, and reactive to light.  Cardiovascular:     Rate and Rhythm: Normal rate and regular rhythm.     Pulses: Normal pulses.     Heart sounds: Normal heart sounds.  Pulmonary:     Effort: Pulmonary effort is normal. No respiratory distress.     Breath sounds: Normal breath sounds.  Abdominal:     General: Bowel sounds are normal. There is no distension.     Palpations: Abdomen is  soft.     Tenderness: There is no abdominal tenderness.  Musculoskeletal:        General: No swelling or deformity.  Skin:    General: Skin is warm and dry.  Neurological:     General: No focal deficit present.     Mental Status: Mental status is at baseline.    Labs on Admission: I have personally reviewed following labs and imaging studies  CBC: Recent Labs  Lab 11/23/21 0429 11/29/21 1028  WBC 7.4 6.7  HGB 8.5* 7.1*  HCT 26.4* 22.4*  MCV 97.4 96.6  PLT 160 629    Basic Metabolic Panel: Recent Labs  Lab 11/23/21 0429 11/29/21 1028  NA 140 134*  K 3.7 4.2  CL 114* 103  CO2 21* 18*  GLUCOSE 165* 368*  BUN 26* 90*  CREATININE 1.57* 1.80*  CALCIUM 8.4* 9.2  MG 2.1  --   PHOS 3.9  --     GFR: Estimated Creatinine Clearance: 32.6 mL/min (A) (by C-G formula based on SCr of 1.8 mg/dL (H)).  Liver Function Tests: Recent Labs  Lab 11/29/21 1028  AST 8*  ALT 10  ALKPHOS 58  BILITOT 0.3  PROT 5.5*  ALBUMIN 3.5    Urine analysis:    Component Value Date/Time   COLORURINE COLORLESS (A) 11/20/2021 2314   APPEARANCEUR CLEAR 11/20/2021 2314   LABSPEC 1.021 11/20/2021 2314   PHURINE 5.0 11/20/2021 2314   GLUCOSEU >1,000 (A) 11/20/2021 2314   HGBUR NEGATIVE 11/20/2021 2314   BILIRUBINUR NEGATIVE 11/20/2021 2314   BILIRUBINUR NEG 07/04/2019 1048   KETONESUR TRACE (A) 11/20/2021 2314   PROTEINUR NEGATIVE 11/20/2021 2314   UROBILINOGEN 0.2 07/04/2019 1048   NITRITE NEGATIVE 11/20/2021 2314   LEUKOCYTESUR NEGATIVE 11/20/2021 2314    Radiological Exams on Admission: No results found.  EKG: Independently reviewed.  Sinus rhythm at 82 bpm.  PVC and PAC noted.  QTc 462.  Nonspecific T wave changes in inferior lateral leads II, III and aVF appear to be affected by a degree of baseline  wander.  Assessment/Plan Principal Problem:   GI bleeding Active Problems:   History of prostate cancer   Type 2 diabetes mellitus with renal manifestations (HCC)   Iron  deficiency anemia due to chronic blood loss   Dyslipidemia (high LDL; low HDL)   PUD (peptic ulcer disease)   CKD (chronic kidney disease), stage III (HCC)   S/P TAVR (transcatheter aortic valve replacement)   Symptomatic anemia   GI bleeding Symptomatic anemia Peptic ulcer disease > Presenting with shortness of breath and melanotic stools.  Did have nausea and vomiting but this was without**. > Recently admitted from October 13 until 16th with GI bleeding and found to have an bleeding duodenal ulcer at the time of EGD. > Presumed replete ulcer of bleeding given elevated BUN and melanotic stools.  Hemoglobin currently 7.1 down from 8.5 on discharge 6 days ago. > GI consulted in the ED and will see the patient in the morning.  Agree with PPI drip. - Patient currently being monitored in stepdown unit - Appreciate gastroenterology recommendations - We will repeat CBC, check type and screen, transfuse 1 unit - Trend hemoglobin overnight - Continue IV PPI drip - N.p.o.  Hyperlipidemia - Continue home atorvastatin  Hypertension > Blood pressure currently in the 93A to 355 systolic in the ED. - Holding home losartan-hydrochlorothiazide, amlodipine, metoprolol in the setting of low normal blood pressure and GI bleeding.  CKD 3B > Creatinine near baseline at 1.8.  BUN elevated likely from upper GI bleeding as above. - Trend renal function and electrolytes  Diabetes - SSI   Stenosis status post TAVR - Noted  History of prostate cancer - Noted  DVT prophylaxis: SCDs Code Status:   Full Family Communication:   Admission.  He states his wife was with him at the med center and she will be joining him in the room.  They recently went through a similar hospitalization and he said his wife will let us know if she has any specific questions.  Disposition Plan:   Patient is from:  Home  Anticipated DC to:  Home  Anticipated DC date:  2 to 4 days  Anticipated DC  barriers: None  Consults called:  GI, consulted in the ED, will see the patient. Admission status:  Inpatient, stepdown  Severity of Illness: The appropriate patient status for this patient is INPATIENT. Inpatient status is judged to be reasonable and necessary in order to provide the required intensity of service to ensure the patient's safety. The patient's presenting symptoms, physical exam findings, and initial radiographic and laboratory data in the context of their chronic comorbidities is felt to place them at high risk for further clinical deterioration. Furthermore, it is not anticipated that the patient will be medically stable for discharge from the hospital within 2 midnights of admission.   * I certify that at the point of admission it is my clinical judgment that the patient will require inpatient hospital care spanning beyond 2 midnights from the point of admission due to high intensity of service, high risk for further deterioration and high frequency of surveillance required.Marcelyn Bruins MD [facility]  How to contact the [facility] Attending or Consulting provider [place] or covering provider during after hours [place], for this patient?   Check the care team in [facility] and look for a) attending/consulting [facility] provider listed and b) the [facility] team listed Log into [email or web address] and use 's universal password to access. If you do not  have the password, please contact the hospital operator. Locate the [facility] provider you are looking for under [facility] and page to a number that you can be directly reached. If you still have difficulty reaching the provider, please page the [facility] (Director on Call) for the Hospitalists listed on amion for assistance.  11/29/2021, 7:27 PM

## 2021-11-29 NOTE — Progress Notes (Signed)
Plan of Care Note for accepted transfer   Patient: Daryl Webb MRN: [id number]   DOA: 11/29/2021  Facility requesting transfer: [facility]. Requesting Provider: Davonna Belling, MD Reason for transfer: Recurrent GI blled. Facility course:  76 years old male with PMH significant of CKD stage IIIa, type 2 diabetes, hypertension, hyperlipidemia, prostate cancer, severe aortic stenosis status post TAVR, CAD, history of duodenal ulcer on EGD done in 2014 presented to the ED with complaints of melena, shortness of breath, confusion / forgetfulness, and elevated blood glucose readings. Hemoglobin on arrival 7.8 and dropped to 6.1. Patient was admitted for acute GI bleeding. GI was consulted.  Patient has received 2 units of packed red blood cells. He was continued on pantoprazole infusion.  Patient underwent EGD, he was found to have Non-bleeding duodenal ulcer with pigmented material.  Patient was continued on pantoprazole infusion for 48 hours after EGD.  Patient was also given iron infusion x1.  Patient felt better after 3 days in the hospital and wanted to go home.  However, he had an episode of melena and an episode of emesis yesterday so he returned to the emergency department.  Hemoglobin is down from 8.56 days ago to 7.1 g/dL today.  Lab work:  Advertising account executive [[id number]] (Abnormal)   Collected: 11/29/21 1028   Updated: 11/29/21 1101   Specimen Type: Blood   Specimen Source: Vein    Sodium 134 Low  mmol/L   Potassium 4.2 mmol/L   Chloride 103 mmol/L   CO2 18 Low  mmol/L   Glucose, Bld 368 High  mg/dL   BUN 90 High  mg/dL   Creatinine, Ser 1.80 High  mg/dL   Calcium 9.2 mg/dL   Total Protein 5.5 Low  g/dL   Albumin 3.5 g/dL   AST 8 Low  U/L   ALT 10 U/L   Alkaline Phosphatase 58 U/L   Total Bilirubin 0.3 mg/dL   GFR, Estimated 39 Low  mL/min   Anion gap 13  CBC [[id number]] (Abnormal)   Collected: 11/29/21 1028   Updated: 11/29/21 1036   Specimen Type: Blood    Specimen Source: Vein    WBC 6.7 K/uL   RBC 2.32 Low  MIL/uL   Hemoglobin 7.1 Low  g/dL   HCT 22.4 Low  %   MCV 96.6 fL   MCH 30.6 pg   MCHC 31.7 g/dL   RDW 17.5 High  %   Platelets 303 K/uL   nRBC 0.0 %    Plan of care: The patient is accepted for admission to [facility] unit, at [facility]..   Author: Reubin Milan, MD 11/29/2021  Check [email or web address] for on-call coverage.  Nursing staff, Please call [place] number on Amion as soon as patient's arrival, so appropriate admitting provider can evaluate the pt.

## 2021-11-29 NOTE — ED Provider Notes (Signed)
[facility] EMERGENC[facility] DEPT Provider Note   CSN: [id number] Arrival date & time: 11/29/21  1011     History  Chief Complaint  Patient presents with   Shortness of Breath    Daryl Webb is a 76 y.o. male.   Shortness of Breath  Patient with shortness of breath.  Worsening anemia sent in for anemia.  Recent GI bleed and was admitted for same.  Had endoscopy done by [facility] and found duodenal ulcer.  Discharged home and now has had recurrent melena and feeling more short of breath.  No chest pain.  Patient's wife brought pictures of the melena.  Sugar is also mildly elevated.   Past Medical History:  Diagnosis Date   Anemia    Arthritis    CKD (chronic kidney disease)    Constipation due to pain medication    needs stool softner while on pain medication   Diabetes mellitus    Duodenal ulcer 08/13/2015   2014  - Dr Kaplan/GI   GERD (gastroesophageal reflux disease)    H/O: osteoarthritis    History of kidney stones    History of prostate cancer    History of urethral stricture    Hypercholesterolemia    Hypertension    Prostate cancer ([place])    prostate   S/P TAVR (transcatheter aortic valve replacement) 08/12/2020   s/p TAVR with a 29 mm Edwards Sapien 3 THV via the TF approach by [name] and [name]   Severe aortic stenosis    Spinal stenosis    Ulcer duodenal hemorrhage     Home Medications Prior to Admission medications   Medication Sig Start Date End Date Taking? Authorizing Provider  acetaminophen (TYLENOL) 325 MG tablet Take 650 mg by mouth every 6 (six) hours as needed for moderate pain.    [provider]  amLODipine (NORVASC) 5 MG tablet Take 1 tablet (5 mg total) by mouth daily. 08/26/21   Hilty, Nadean Corwin, MD  amoxicillin (AMOXIL) 500 MG capsule Take 4 capsules (2,000 mg total) by mouth See admin instructions. Take 2000 mg 1 hour prior to dental procedures 07/01/21   Eileen Stanford, PA-C  Apoaequorin (PREVAGEN  PO) Take 1 capsule by mouth daily.    [provider]  aspirin 81 MG tablet Take 81 mg by mouth daily.    [provider]  atorvastatin (LIPITOR) 40 MG tablet Take 1 tablet (40 mg total) by mouth daily. 08/26/21   Hilty, Nadean Corwin, MD  calcium carbonate (TUMS - DOSED IN MG ELEMENTAL CALCIUM) 500 MG chewable tablet Chew 2 tablets by mouth daily as needed for indigestion or heartburn.    [provider]  Coenzyme Q10 (COQ10) 100 MG CAPS Take 100 mg by mouth daily.    [provider]  cyanocobalamin (VITAMIN B12) 1000 MCG/ML injection Inject 1,000 mcg into the muscle every 30 (thirty) days.    [provider]  dapagliflozin propanediol (FARXIGA) 10 MG TABS tablet Take 10 mg by mouth daily.    [provider]  diphenhydrAMINE HCl, Sleep, (ZZZQUIL) 25 MG CAPS Take 25 mg by mouth at bedtime.    [provider]  ferrous gluconate (FERGON) 324 MG tablet Take 1 tablet (324 mg total) by mouth daily with breakfast. 11/30/21   Shawna Clamp, MD  losartan-hydrochlorothiazide (HYZAAR) 50-12.5 MG tablet TAKE 1 TABLET DAILY Patient taking differently: Take 1 tablet by mouth daily. 08/31/21   Hilty, Nadean Corwin, MD  metoprolol succinate (TOPROL-XL) 50 MG 24  hr tablet TAKE 1 TABLET DAILY Patient taking differently: Take 50 mg by mouth daily. 04/30/21   Hilty, Nadean Corwin, MD  nystatin ointment (MYCOSTATIN) Apply 1 application topically 3 (three) times daily. Patient taking differently: Apply 1 application  topically 3 (three) times daily as needed (jock itch). 08/01/20   Gaye Pollack, MD  pantoprazole (PROTONIX) 40 MG tablet Take 1 tablet (40 mg total) by mouth 2 (two) times daily before a meal. 11/23/21   Shawna Clamp, MD      Allergies    Patient has no known allergies.    Review of Systems   Review of Systems  Respiratory:  Positive for shortness of breath.     Physical Exam Updated Vital Signs BP (!) 106/54   Pulse 70   Temp 98.3 F (36.8  C)   Resp 11   Ht '5\' 7"'$  (1.702 m)   Wt 70.3 kg   SpO2 100%   BMI 24.27 kg/m  Physical Exam Vitals and nursing note reviewed.  HENT:     Head: Atraumatic.  Cardiovascular:     Rate and Rhythm: Normal rate and regular rhythm.  Pulmonary:     Breath sounds: Normal breath sounds.  Chest:     Chest wall: No tenderness.  Skin:    Coloration: Skin is pale.  Neurological:     Mental Status: He is alert.     ED Results / Procedures / Treatments   Labs (all labs ordered are listed, but only abnormal results are displayed) Labs Reviewed  COMPREHENSIVE METABOLIC PANEL - Abnormal; Notable for the following components:      Result Value   Sodium 134 (*)    CO2 18 (*)    Glucose, Bld 368 (*)    BUN 90 (*)    Creatinine, Ser 1.80 (*)    Total Protein 5.5 (*)    AST 8 (*)    GFR, Estimated 39 (*)    All other components within normal limits  CBC - Abnormal; Notable for the following components:   RBC 2.32 (*)    Hemoglobin 7.1 (*)    HCT 22.4 (*)    RDW 17.5 (*)    All other components within normal limits    EKG None  Radiology No results found.  Procedures Procedures    Medications Ordered in ED Medications  pantoprozole (PROTONIX) 80 mg /NS 100 mL infusion (8 mg/hr Intravenous New Bag/Given 11/29/21 1355)  pantoprazole (PROTONIX) injection 40 mg (has no administration in time range)  pantoprazole (PROTONIX) 80 mg /NS 100 mL IVPB (0 mg Intravenous Stopped 11/29/21 1354)  sodium chloride 0.9 % bolus 1,000 mL (1,000 mLs Intravenous New Bag/Given 11/29/21 1328)    ED Course/ Medical Decision Making/ A&P                           Medical Decision Making Amount and/or Complexity of Data Reviewed Labs: ordered.  Risk Prescription drug management. Decision regarding hospitalization.   Patient shortness of breath.  Likely secondary to anemia.  History of bleeding ulcer and is having melena again.  Blood pressure is on the mildly lower side but maintained heart  rate and does not feel bad sitting there.  Hemoglobin is gone back down to 7.1 when recently transfused up to 8 from a hemoglobin of 6.  Discussed with [facility].  Started on Protonix drip.  Will require admission to hospital and discussed with hospitalist.  As long as  blood pressure maintained will hold off on transfusing emergent blood but is here if needed.  CRITICAL CARE Performed by: Davonna Belling Total critical care time: 30 minutes Critical care time was exclusive of separately billable procedures and treating other patients. Critical care was necessary to treat or prevent imminent or life-threatening deterioration. Critical care was time spent personally by me on the following activities: development of treatment plan with patient and/or surrogate as well as nursing, discussions with consultants, evaluation of patient's response to treatment, examination of patient, obtaining history from patient or surrogate, ordering and performing treatments and interventions, ordering and review of laboratory studies, ordering and review of radiographic studies, pulse oximetry and re-evaluation of patient's condition.         Final Clinical Impression(s) / ED Diagnoses Final diagnoses:  Gastrointestinal hemorrhage associated with duodenal ulcer  Symptomatic anemia    Rx / DC Orders ED Discharge Orders     None         Davonna Belling, MD 11/29/21 1526

## 2021-11-29 NOTE — ED Triage Notes (Signed)
Pt returns for sob; pt was recently admitted for bleeding ulcer; states he is having tarry stools again. He endorses nausea and vomiting yesterday. Pt is dizzy, weak, pale. His cbg has been in the upper 200's and 300's; was 283 today. Pt alert, wife states he has had some forgetfulness. NAD noted.

## 2021-11-29 NOTE — Progress Notes (Signed)
Patient is aware of risks associated with blood product administration and agrees to receive blood. Informed consent for blood product administration signed by patient and placed in chart.

## 2021-11-30 ENCOUNTER — Encounter (HOSPITAL_COMMUNITY): Payer: Self-pay | Admitting: Internal Medicine

## 2021-11-30 ENCOUNTER — Inpatient Hospital Stay (HOSPITAL_COMMUNITY): Payer: Medicare Other | Admitting: Anesthesiology

## 2021-11-30 ENCOUNTER — Encounter (HOSPITAL_COMMUNITY)
Admission: EM | Disposition: A | Discharge status: Home / Self Care | Payer: Self-pay | Source: Home / Self Care | Attending: Internal Medicine

## 2021-11-30 ENCOUNTER — Inpatient Hospital Stay: Payer: Medicare Other | Admitting: Family Medicine

## 2021-11-30 DIAGNOSIS — D62 Acute posthemorrhagic anemia: Secondary | ICD-10-CM

## 2021-11-30 DIAGNOSIS — E785 Hyperlipidemia, unspecified: Secondary | ICD-10-CM | POA: Diagnosis not present

## 2021-11-30 DIAGNOSIS — K921 Melena: Secondary | ICD-10-CM | POA: Diagnosis not present

## 2021-11-30 DIAGNOSIS — K269 Duodenal ulcer, unspecified as acute or chronic, without hemorrhage or perforation: Secondary | ICD-10-CM

## 2021-11-30 DIAGNOSIS — N189 Chronic kidney disease, unspecified: Secondary | ICD-10-CM | POA: Diagnosis not present

## 2021-11-30 DIAGNOSIS — I129 Hypertensive chronic kidney disease with stage 1 through stage 4 chronic kidney disease, or unspecified chronic kidney disease: Secondary | ICD-10-CM

## 2021-11-30 DIAGNOSIS — E782 Mixed hyperlipidemia: Secondary | ICD-10-CM

## 2021-11-30 DIAGNOSIS — Z87891 Personal history of nicotine dependence: Secondary | ICD-10-CM

## 2021-11-30 DIAGNOSIS — Z8546 Personal history of malignant neoplasm of prostate: Secondary | ICD-10-CM | POA: Diagnosis not present

## 2021-11-30 DIAGNOSIS — Z7984 Long term (current) use of oral hypoglycemic drugs: Secondary | ICD-10-CM

## 2021-11-30 DIAGNOSIS — E1122 Type 2 diabetes mellitus with diabetic chronic kidney disease: Secondary | ICD-10-CM

## 2021-11-30 DIAGNOSIS — K264 Chronic or unspecified duodenal ulcer with hemorrhage: Principal | ICD-10-CM

## 2021-11-30 DIAGNOSIS — D631 Anemia in chronic kidney disease: Secondary | ICD-10-CM

## 2021-11-30 DIAGNOSIS — E7800 Pure hypercholesterolemia, unspecified: Secondary | ICD-10-CM

## 2021-11-30 DIAGNOSIS — N1832 Chronic kidney disease, stage 3b: Secondary | ICD-10-CM | POA: Diagnosis not present

## 2021-11-30 HISTORY — PX: HEMOSTASIS CLIP PLACEMENT: SHX6857

## 2021-11-30 HISTORY — PX: ESOPHAGOGASTRODUODENOSCOPY (EGD) WITH PROPOFOL: SHX5813

## 2021-11-30 LAB — COMPREHENSIVE METABOLIC PANEL
ALT: 13 U/L (ref 0–44)
AST: 10 U/L — ABNORMAL LOW (ref 15–41)
Albumin: 2.8 g/dL — ABNORMAL LOW (ref 3.5–5.0)
Alkaline Phosphatase: 49 U/L (ref 38–126)
Anion gap: 7 (ref 5–15)
BUN: 71 mg/dL — ABNORMAL HIGH (ref 8–23)
CO2: 21 mmol/L — ABNORMAL LOW (ref 22–32)
Calcium: 8.6 mg/dL — ABNORMAL LOW (ref 8.9–10.3)
Chloride: 112 mmol/L — ABNORMAL HIGH (ref 98–111)
Creatinine, Ser: 1.77 mg/dL — ABNORMAL HIGH (ref 0.61–1.24)
GFR, Estimated: 39 mL/min — ABNORMAL LOW (ref >60)
Glucose, Bld: 173 mg/dL — ABNORMAL HIGH (ref 70–99)
Potassium: 4.3 mmol/L (ref 3.5–5.1)
Sodium: 140 mmol/L (ref 135–145)
Total Bilirubin: 0.9 mg/dL (ref 0.3–1.2)
Total Protein: 5.1 g/dL — ABNORMAL LOW (ref 6.5–8.1)

## 2021-11-30 LAB — GLUCOSE, CAPILLARY
Glucose-Capillary: 176 mg/dL — ABNORMAL HIGH (ref 70–99)
Glucose-Capillary: 205 mg/dL — ABNORMAL HIGH (ref 70–99)
Glucose-Capillary: 211 mg/dL — ABNORMAL HIGH (ref 70–99)
Glucose-Capillary: 201 mg/dL — ABNORMAL HIGH (ref 70–99)
Glucose-Capillary: 437 mg/dL — ABNORMAL HIGH (ref 70–99)

## 2021-11-30 LAB — CBC
HCT: 22.2 % — ABNORMAL LOW (ref 39.0–52.0)
Hemoglobin: 7.1 g/dL — ABNORMAL LOW (ref 13.0–17.0)
MCH: 30.7 pg (ref 26.0–34.0)
MCHC: 32 g/dL (ref 30.0–36.0)
MCV: 96.1 fL (ref 80.0–100.0)
Platelets: 256 10*3/uL (ref 150–400)
RBC: 2.31 MIL/uL — ABNORMAL LOW (ref 4.22–5.81)
RDW: 18.1 % — ABNORMAL HIGH (ref 11.5–15.5)
WBC: 8 10*3/uL (ref 4.0–10.5)
nRBC: 0 % (ref 0.0–0.2)

## 2021-11-30 LAB — HEMOGLOBIN AND HEMATOCRIT, BLOOD
HCT: 24.7 % — ABNORMAL LOW (ref 39.0–52.0)
Hemoglobin: 7.8 g/dL — ABNORMAL LOW (ref 13.0–17.0)

## 2021-11-30 LAB — PREPARE RBC (CROSSMATCH)

## 2021-11-30 SURGERY — ESOPHAGOGASTRODUODENOSCOPY (EGD) WITH PROPOFOL
Anesthesia: Monitor Anesthesia Care

## 2021-11-30 MED ORDER — LACTATED RINGERS IV SOLN: INTRAVENOUS | Status: DC

## 2021-11-30 MED ORDER — INSULIN ASPART 100 UNIT/ML IJ SOLN
10.0000 [IU] | Freq: Once | INTRAMUSCULAR | Status: AC
  Administered 2021-11-30: 10 [IU] via SUBCUTANEOUS

## 2021-11-30 MED ORDER — PROPOFOL 500 MG/50ML IV EMUL
INTRAVENOUS | Status: DC | PRN
  Administered 2021-11-30: 125 ug/kg/min via INTRAVENOUS

## 2021-11-30 MED ORDER — SODIUM CHLORIDE 0.9% IV SOLUTION: Freq: Once | INTRAVENOUS | Status: DC

## 2021-11-30 MED ORDER — CYANOCOBALAMIN 1000 MCG/ML IJ SOLN
1000.0000 ug | Freq: Once | INTRAMUSCULAR | Status: AC
  Administered 2021-11-30: 1000 ug via INTRAMUSCULAR
  Filled 2021-11-30: qty 1

## 2021-11-30 MED ORDER — PROPOFOL 10 MG/ML IV BOLUS
INTRAVENOUS | Status: DC | PRN
  Administered 2021-11-30: 20 mg via INTRAVENOUS

## 2021-11-30 MED ORDER — PROPOFOL 500 MG/50ML IV EMUL
INTRAVENOUS | Status: AC
  Filled 2021-11-30: qty 50

## 2021-11-30 MED ORDER — PHENYLEPHRINE 80 MCG/ML (10ML) SYRINGE FOR IV PUSH (FOR BLOOD PRESSURE SUPPORT)
PREFILLED_SYRINGE | INTRAVENOUS | Status: DC | PRN
  Administered 2021-11-30 (×2): 80 ug via INTRAVENOUS
  Administered 2021-11-30: 160 ug via INTRAVENOUS

## 2021-11-30 SURGICAL SUPPLY — 15 items

## 2021-11-30 NOTE — Progress Notes (Signed)
    OVERNIGHT PROGRESS REPORT  Notified by RN of CBC Hgb at 7.1g/dL  Recent event similarly required  multiple units to maintain level. GI is consulted for this AM.  As previous event notations by Attendings and Consults, a second unit will be ordered to attempt to maintain 7-8 g/dL in light of GI and Cardiac history and in the event of any GI interventions should they arise later this AM.       Gershon Cull MSNA MSN ACNPC-AG Acute Care Nurse Practitioner [place]

## 2021-11-30 NOTE — Anesthesia Preprocedure Evaluation (Signed)
Anesthesia Evaluation  Patient identified by MRN, date of birth, ID band Patient awake    Reviewed: Allergy & Precautions, NPO status , Patient's Chart, lab work & pertinent test results, reviewed documented beta blocker date and time   History of Anesthesia Complications Negative for: history of anesthetic complications  Airway Mallampati: II  TM Distance: >3 FB Neck ROM: Full    Dental  (+) Dental Advisory Given   Pulmonary shortness of breath, former smoker,    Pulmonary exam normal        Cardiovascular hypertension, Pt. on medications and Pt. on home beta blockers Normal cardiovascular exam+ Valvular Problems/Murmurs (s/p TAVR)      Neuro/Psych negative neurological ROS  negative psych ROS   GI/Hepatic Neg liver ROS, PUD, GERD  ,  Endo/Other  diabetes, Type 2, Oral Hypoglycemic Agents  Renal/GU CRFRenal disease     Musculoskeletal  (+) Arthritis , Osteoarthritis,    Abdominal   Peds  Hematology  (+) Blood dyscrasia, anemia ,   Anesthesia Other Findings   Reproductive/Obstetrics                             Anesthesia Physical  Anesthesia Plan  ASA: 3  Anesthesia Plan: MAC   Post-op Pain Management: Minimal or no pain anticipated   Induction:   PONV Risk Score and Plan: 1 and Propofol infusion and Treatment may vary due to age or medical condition  Airway Management Planned: Nasal Cannula and Natural Airway  Additional Equipment: None  Intra-op Plan:   Post-operative Plan:   Informed Consent: I have reviewed the patients History and Physical, chart, labs and discussed the procedure including the risks, benefits and alternatives for the proposed anesthesia with the patient or authorized representative who has indicated his/her understanding and acceptance.       Plan Discussed with: CRNA and Anesthesiologist  Anesthesia Plan Comments:         Anesthesia Quick  Evaluation

## 2021-11-30 NOTE — Progress Notes (Signed)
PROGRESS NOTE    Daryl Webb  KWI:[id number] DOB: 1945-07-14 DOA: 11/29/2021 PCP: [name], [name], MD   Brief Narrative:  The patient is an elderly 76 year old Caucasian male with past medical history significant for but not limited to hyperlipidemia, prostate cancer, history of diabetes mellitus type 2, history of anemia, history of recent GI bleeding as well as peptic ulcer disease, history of chronic kidney disease stage IIIb, history of aortic stenosis status post TAVR who presented with shortness of breath and dark stools.  Of note he was recently admitted from October 13 until October 16 with worsening anemia and GI bleeding.  EGD was done and showed a nonbleeding ulcer at that time of examination and patient was given a couple days of IV PPI and then discharged on p.o. PPI.  Since returning home his recent become short of breath and has had episodes of melena as well as episode of nausea with vomiting but no bloody or coffee-ground emesis in his vomitus.  He states that his vomitus did appear brown but appears mostly like food that he been eating and he reports feeling weak.  He came to the ED and was worked up and was found to have a hemoglobin of 7.1 and received a liter bolus.  He was a started on a PPI drip and GI was consulted.  Given his drop in hemoglobin he was typed and screened and transfused 2 units of PRBCs.  Given his drop in hemoglobin plan is for EGD this afternoon and is currently n.p.o.  He received Ferrlecit x2 on the prior admission.  GI recommending continuing serial hemoglobin and transfusing as indicated to keep hemoglobin in the range of 8 and above due to his age comorbidities and symptomatic state.  B12 was also ordered.   Assessment and Plan:  Upper GI GI bleeding Symptomatic anemia superimposed on chronic iron deficiency anemia and B12 deficiency Peptic ulcer disease -Presenting with shortness of breath and melanotic stools.  Did have nausea and vomiting but  this was without coffee-ground emesis -Recently admitted from October 13 until 16th with GI bleeding and found to have an bleeding duodenal ulcer at the time of EGD. -Presumed to have an upper GI bleeding given ulcer noted on last EGD and elevated BUN and melanotic stools.  Hemoglobin currently 7.1 down from 8.5 on discharge 6 days ago. -GI consulted in the ED and will see the patient in the morning.  Agree with PPI drip.  I will continue - Patient currently being monitored in stepdown unit - Appreciate gastroenterology recommendations -Patient's hemoglobin/hematocrit went from 6.6/21.4 -> 7.1/22.2 -> 7.8/24.7 on last check -We will give vitamin B12 injection today -Patient's hemoglobin/hematocrit needs to be monitored carefully monitor her serial hemoglobins and transfuse as indicated to keep hemoglobin level 8 -Continue IV PPI drip -N.p.o. for EGD today    Hyperlipidemia -Continue home Atorvastatin when no longer Npo   Hypertension -Blood pressure currently in the 24Q to 683 systolic in the ED. -Holding home losartan-hydrochlorothiazide, amlodipine, metoprolol in the setting of low normal blood pressure and GI bleeding. -Continue to monitor blood pressures per protocol -Last blood pressure reading was   CKD 3B Metabolic acidosis Creatinine near baseline at 1.8.  BUN elevated likely from upper GI bleeding as above. -Patient's CO2 is now 21, anion gap is now 7, chloride level is 112 -Patient's BUNs/creatinine went from 90/1.80 and is now 71/1.77 with a GFR of 39 -Avoid further nephrotoxic medications, contrast dyes, hypotension and dehydration to ensure adequate  renal perfusion and will need to renally dose medications -Repeat CMP in the a.m. and monitor renal function carefully   Diabetes Mellitus Type II -Continue Sensitive NovoLog/scale insulin AC -Holding his home Farxiga and metformin -CBGs ranging from 176-211 -Continue monitor blood sugars per protocol if necessary will adjust  as necessary  Hypoalbuminemia -Patients albumin level has gone from 3.5 x 2.8 -Continue to monitor and trend and repeat CMP in the AM  Stenosis status post TAVR -Noted his metoprolol has been held and not on any anticoagulation from my I can see   History of prostate cancer - Noted  DVT prophylaxis: SCDs Start: 11/29/21 1907    Code Status: Full Code Family Communication: Discussed with wife at bedside  Disposition Plan:  Level of care: Stepdown Status is: Inpatient Remains inpatient appropriate because: Is going for an EGD for his GI bleeding and will need GI clearance prior to safe discharge disposition   Consultants:  Gastroenterology  Procedures:  EGD to be done  Antimicrobials:  Anti-infectives (From admission, onward)    None       Subjective: Seen and examined at bedside and states that he is feeling fatigued and did not sleep as well.  No nausea or vomiting now.  Feels okay.  Awaiting EGD and states that his stools have been black.  No other concerns or close at this time.  Objective: Vitals:   11/30/21 1100 11/30/21 1200 11/30/21 1300 11/30/21 1334  BP: (!) 129/51 (!) 133/53 108/64 138/63  Pulse: 75 79 79 77  Resp: '20 19 19 18  '$ Temp:  97.9 F (36.6 C)  98.3 F (36.8 C)  TempSrc:  Oral  Temporal  SpO2: 98% 98% 98% 100%  Weight:      Height:        Intake/Output Summary (Last 24 hours) at 11/30/2021 1344 Last data filed at 11/30/2021 1300 Gross per 24 hour  Intake 1050.56 ml  Output 900 ml  Net 150.56 ml   Filed Weights   11/29/21 1018  Weight: 70.3 kg   Examination: Physical Exam:  Constitutional: WN/WD Caucasian male in no acute distress Respiratory: Diminished to auscultation bilaterally with coarse breath sounds, no wheezing, rales, rhonchi or crackles. Normal respiratory effort and patient is not tachypenic. No accessory muscle use.  Unlabored breathing Cardiovascular: RRR, no murmurs / rubs / gallops. S1 and S2 auscultated. No  extremity edema.  Abdomen: Soft, non-tender, non-distended. Bowel sounds positive.  GU: Deferred. Musculoskeletal: No clubbing / cyanosis of digits/nails. No joint deformity upper and lower extremities.  Skin: No rashes, lesions, ulcers on limited skin evaluation. No induration; Warm and dry.  Neurologic: CN 2-12 grossly intact with no focal deficits. Romberg sign and cerebellar reflexes not assessed.  Psychiatric: Normal judgment and insight. Alert and oriented x 3. Normal mood and appropriate affect.   Data Reviewed: I have personally reviewed following labs and imaging studies  CBC: Recent Labs  Lab 11/29/21 1028 11/29/21 1958 11/30/21 0240 11/30/21 0940  WBC 6.7 8.3 8.0  --   HGB 7.1* 6.6* 7.1* 7.8*  HCT 22.4* 21.4* 22.2* 24.7*  MCV 96.6 100.9* 96.1  --   PLT 303 293 256  --    Basic Metabolic Panel: Recent Labs  Lab 11/29/21 1028 11/30/21 0240  NA 134* 140  K 4.2 4.3  CL 103 112*  CO2 18* 21*  GLUCOSE 368* 173*  BUN 90* 71*  CREATININE 1.80* 1.77*  CALCIUM 9.2 8.6*   GFR: Estimated Creatinine Clearance: 33.2 mL/min (A) (  by C-G formula based on SCr of 1.77 mg/dL (H)). Liver Function Tests: Recent Labs  Lab 11/29/21 1028 11/30/21 0240  AST 8* 10*  ALT 10 13  ALKPHOS 58 49  BILITOT 0.3 0.9  PROT 5.5* 5.1*  ALBUMIN 3.5 2.8*   No results for input(s): "LIPASE", "AMYLASE" in the last 168 hours. No results for input(s): "AMMONIA" in the last 168 hours. Coagulation Profile: No results for input(s): "INR", "PROTIME" in the last 168 hours. Cardiac Enzymes: No results for input(s): "CKTOTAL", "CKMB", "CKMBINDEX", "TROPONINI" in the last 168 hours. BNP (last 3 results) No results for input(s): "PROBNP" in the last 8760 hours. HbA1C: No results for input(s): "HGBA1C" in the last 72 hours. CBG: Recent Labs  Lab 11/29/21 2056 11/29/21 2331 11/30/21 0400 11/30/21 0740 11/30/21 1218  GLUCAP 205* 193* 176* 205* 211*   Lipid Profile: No results for input(s):  "CHOL", "HDL", "LDLCALC", "TRIG", "CHOLHDL", "LDLDIRECT" in the last 72 hours. Thyroid Function Tests: No results for input(s): "TSH", "T4TOTAL", "FREET4", "T3FREE", "THYROIDAB" in the last 72 hours. Anemia Panel: No results for input(s): "VITAMINB12", "FOLATE", "FERRITIN", "TIBC", "IRON", "RETICCTPCT" in the last 72 hours. Sepsis Labs: No results for input(s): "PROCALCITON", "LATICACIDVEN" in the last 168 hours.  Recent Results (from the past 240 hour(s))  MRSA Next Gen by PCR, Nasal     Status: None   Collection Time: 11/29/21  6:59 PM   Specimen: Nasal Mucosa; Nasal Swab  Result Value Ref Range Status   MRSA by PCR Next Gen NOT DETECTED NOT DETECTED Final    Comment: (NOTE) The GeneXpert MRSA Assay (FDA approved for NASAL specimens only), is one component of a comprehensive MRSA colonization surveillance program. It is not intended to diagnose MRSA infection nor to guide or monitor treatment for MRSA infections. Test performance is not FDA approved in patients less than 48 years old. Performed at [facility], [place] [address] [place], [place] [address]     Radiology Studies: No results found.  Scheduled Meds:  [MAR Hold] sodium chloride   Intravenous Once   [MAR Hold] sodium chloride   Intravenous Once   [MAR Hold] atorvastatin  40 mg Oral Daily   [MAR Hold] Chlorhexidine Gluconate Cloth  6 each Topical Daily   [MAR Hold] insulin aspart  0-9 Units Subcutaneous Q4H   [MAR Hold] pantoprazole  40 mg Intravenous Q12H   [MAR Hold] sodium chloride flush  3 mL Intravenous Q12H   Continuous Infusions:  lactated ringers 10 mL/hr at 11/30/21 1337   pantoprazole 8 mg/hr (11/30/21 1300)    LOS: 1 day   Raiford Noble, DO [facility] Available via Epic secure chat 7am-7pm After these hours, please refer to coverage provider listed on [email or web address] 11/30/2021, 1:44 PM

## 2021-11-30 NOTE — Consult Note (Addendum)
Consultation  Referring Provider: No ref. provider found Primary Care Physician:  [name], [name], MD Primary Gastroenterologist:  Dr.Mansouraty/recent admit  Reason for Consultation:  Melena/anemia/ SOB  HPI: Daryl Webb is a 76 y.o. male, who was admitted here last night after being transferred from [facility] emergency room where he presented with weakness, lightheadedness, shortness of breath and melena. Initial hemoglobin 7.1. He has not been on any aspirin or blood thinners. Patient had very recent admission with an upper GI bleed less than 2 weeks ago and underwent EGD on 11/21/2021 with finding of a duodenal ulcer 10 to 12 mm in size, cratered with heaped edges, there was a tiny black spot in the center.  No endoscopic therapy was done, also noted additional shallow ulcerations from the duodenal bulb to the second portion of the duodenum. Hemoglobin on discharge from that hospitalization 8.5/hematocrit 26.4 He has been on twice daily Protonix at home.  Wife reports onset of melena yesterday morning.  Hemoglobin drifted to 6.6 he has been transfused 2 units of packed RBCs, follow-up hemoglobin is pending.  He has been hemodynamically stable overnight and says he is feeling a bit better, no complaints of nausea or vomiting, no abdominal pain, and no melena through the night.  Other medical problems include history of prostate cancer chronic kidney disease stage IIIb, aortic stenosis status post TAVR, adult onset diabetes mellitus, and B12 deficiency.  His wife relates that he has had issues with memory loss.   Past Medical History:  Diagnosis Date   Acute blood loss anemia 06/27/2012   Anemia    Arthritis    CKD (chronic kidney disease)    Constipation due to pain medication    needs stool softner while on pain medication   Diabetes mellitus    Duodenal ulcer 08/13/2015   2014  - Dr Kaplan/GI   Duodenal ulcer with hemorrhage 08/13/2015   2014  - Dr Kaplan/GI    Dyspnea 06/19/2012   GERD (gastroesophageal reflux disease)    H/O: osteoarthritis    History of kidney stones    History of prostate cancer    History of urethral stricture    Hypercholesterolemia    Hypertension    Melena 06/19/2012   Prostate cancer ([place])    prostate   S/P TAVR (transcatheter aortic valve replacement) 08/12/2020   s/p TAVR with a 29 mm Edwards Sapien 3 THV via the TF approach by [name] and [name]   Severe aortic stenosis    Spinal stenosis    Ulcer duodenal hemorrhage     Past Surgical History:  Procedure Laterality Date   ARTHROPLASTY  2004   right knee [name] SURGERY     BIOPSY  11/21/2021   Procedure: BIOPSY;  Surgeon: Doran Stabler, MD;  Location: [facility];  Service: Gastroenterology;;   CARDIAC CATHETERIZATION     CARPAL TUNNEL RELEASE     right hand,wrist [name]   CYSTOSCOPY N/A 09/20/2012   Procedure: Consuela Mimes;  Surgeon: [name], MD;  Location: [facility];  Service: Urology;  Laterality: N/A;   ESOPHAGOGASTRODUODENOSCOPY N/A 11/21/2021   Procedure: ESOPHAGOGASTRODUODENOSCOPY (EGD);  Surgeon: Doran Stabler, MD;  Location: [facility];  Service: Gastroenterology;  Laterality: N/A;   JOINT REPLACEMENT Right    KNEE ARTHROSCOPY Right    LUMBAR LAMINECTOMY  02/08/2013   PROSTATECTOMY     radical   RIGHT/LEFT HEART CATH AND CORONARY ANGIOGRAPHY N/A 05/08/2020   Procedure:  RIGHT/LEFT HEART CATH AND CORONARY ANGIOGRAPHY;  Surgeon: [name], MD;  Location: [place] [facility];  Service: Cardiovascular;  Laterality: N/A;   TEE WITHOUT CARDIOVERSION N/A 08/12/2020   Procedure: TRANSESOPHAGEAL ECHOCARDIOGRAM (TEE);  Surgeon: [name], MD;  Location: [place] CV LAB;  Service: Open Heart Surgery;  Laterality: N/A;   TRANSCATHETER AORTIC VALVE REPLACEMENT, TRANSFEMORAL N/A 08/12/2020   Procedure: TRANSCATHETER AORTIC VALVE REPLACEMENT, TRANSFEMORAL;  Surgeon: [name], MD;  Location: [place]  CV LAB;  Service: Open Heart Surgery;  Laterality: N/A;   Silex    Prior to Admission medications   Medication Sig Start Date End Date Taking? Authorizing Provider  amLODipine (NORVASC) 5 MG tablet Take 1 tablet (5 mg total) by mouth daily. Patient taking differently: Take 5 mg by mouth at bedtime. 08/26/21  Yes Hilty, Nadean Corwin, MD  amoxicillin (AMOXIL) 500 MG capsule Take 4 capsules (2,000 mg total) by mouth See admin instructions. Take 2000 mg 1 hour prior to dental procedures Patient taking differently: Take 2,000 mg by mouth See admin instructions. Take 2,000 mg by mouth one hour prior to dental procedures 07/01/21  Yes Eileen Stanford, PA-C  Apoaequorin (PREVAGEN PO) Take 1 capsule by mouth daily.   Yes [provider]  aspirin 81 MG tablet Take 81 mg by mouth daily.   Yes [provider]  atorvastatin (LIPITOR) 40 MG tablet Take 1 tablet (40 mg total) by mouth daily. Patient taking differently: Take 40 mg by mouth at bedtime. 08/26/21  Yes Hilty, Nadean Corwin, MD  calcium carbonate (TUMS - DOSED IN MG ELEMENTAL CALCIUM) 500 MG chewable tablet Chew 2 tablets by mouth daily as needed for indigestion or heartburn.   Yes [provider]  Coenzyme Q10 (COQ10) 100 MG CAPS Take 100 mg by mouth daily.   Yes [provider]  cyanocobalamin (VITAMIN B12) 1000 MCG/ML injection Inject 1,000 mcg into the muscle every 30 (thirty) days.   Yes [provider]  FARXIGA 10 MG TABS tablet Take 10 mg by mouth in the morning.   Yes [provider]  losartan-hydrochlorothiazide (HYZAAR) 50-12.5 MG tablet TAKE 1 TABLET DAILY Patient taking differently: Take 1 tablet by mouth daily. 08/31/21  Yes Hilty, Nadean Corwin, MD  metFORMIN (GLUCOPHAGE) 500 MG tablet Take 500 mg by mouth 2 (two) times daily with a meal.   Yes [provider]  metoprolol succinate (TOPROL-XL) 50 MG 24 hr tablet TAKE 1 TABLET DAILY Patient taking differently: Take 50 mg by  mouth daily. 04/30/21  Yes Hilty, Nadean Corwin, MD  NON FORMULARY Take 2 capsules by mouth See admin instructions. Vicks ZzzQuil Pure Zzzs Triple Action Gummies- Chew 2 gummies by mouth every night at bedtime   Yes [provider]  nystatin ointment (MYCOSTATIN) Apply 1 application topically 3 (three) times daily. Patient taking differently: Apply 1 application  topically 3 (three) times daily as needed (jock itch). 08/01/20  Yes Gaye Pollack, MD  pantoprazole (PROTONIX) 40 MG tablet Take 1 tablet (40 mg total) by mouth 2 (two) times daily before a meal. Patient taking differently: Take 40 mg by mouth 2 (two) times daily before a meal. Take 40 mg by mouth before breakfast and supper 11/23/21  Yes Shawna Clamp, MD  TYLENOL 500 MG tablet Take 1,000 mg by mouth every 6 (six) hours as needed for headache.   Yes [provider]  ferrous gluconate (FERGON) 324 MG tablet Take 1 tablet (324 mg total) by mouth daily  with breakfast. Patient not taking: Reported on 11/29/2021 11/30/21   Shawna Clamp, MD    Current Facility-Administered Medications  Medication Dose Route Frequency Provider Last Rate Last Admin   0.9 %  sodium chloride infusion (Manually program via Guardrails IV Fluids)   Intravenous Once Marcelyn Bruins, MD       0.9 %  sodium chloride infusion (Manually program via Guardrails IV Fluids)   Intravenous Once Kathryne Eriksson, NP       acetaminophen (TYLENOL) tablet 650 mg  650 mg Oral Q6H PRN Marcelyn Bruins, MD       Or   acetaminophen (TYLENOL) suppository 650 mg  650 mg Rectal Q6H PRN Marcelyn Bruins, MD       atorvastatin (LIPITOR) tablet 40 mg  40 mg Oral Daily Marcelyn Bruins, MD       Chlorhexidine Gluconate Cloth 2 % PADS 6 each  6 each Topical Daily Marcelyn Bruins, MD   6 each at 11/30/21 0537   insulin aspart (novoLOG) injection 0-9 Units  0-9 Units Subcutaneous Q4H Marcelyn Bruins, MD   2 Units at 11/30/21 0745   Oral care mouth rinse   15 mL Mouth Rinse PRN Marcelyn Bruins, MD       [START ON 12/03/2021] pantoprazole (PROTONIX) injection 40 mg  40 mg Intravenous Q12H Marcelyn Bruins, MD       pantoprozole (PROTONIX) 80 mg /NS 100 mL infusion  8 mg/hr Intravenous Continuous Marcelyn Bruins, MD 10 mL/hr at 11/30/21 0600 8 mg/hr at 11/30/21 0600   sodium chloride flush (NS) 0.9 % injection 3 mL  3 mL Intravenous Q12H Marcelyn Bruins, MD   3 mL at 11/29/21 2230    Allergies as of 11/29/2021   (No Known Allergies)    Family History  Problem Relation Age of Onset   Heart disease Mother    Heart failure Mother    Heart disease Father    Diabetes Father     Social History   Socioeconomic History   Marital status: Married    Spouse name: Not on file   Number of children: 2   Years of education: 16   Highest education level: Bachelor's degree (e.g., BA, AB, BS)  Occupational History   Occupation: Retired  Tobacco Use   Smoking status: Former    Packs/day: 1.50    Years: 20.00    Total pack years: 30.00    Types: Cigarettes    Quit date: 02/09/1980    Years since quitting: 41.8   Smokeless tobacco: Never  Vaping Use   Vaping Use: Never used  Substance and Sexual Activity   Alcohol use: No   Drug use: No   Sexual activity: Not Currently  Other Topics Concern   Not on file  Social History Narrative   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    Lives with wife,    two children in [place].    Enjoys reading, boating, travelling to [place] in the winter with wife.   Social Determinants of Health   Financial Resource Strain: Low Risk  (12/19/2020)   Overall Financial Resource Strain (CARDIA)    Difficulty of Paying Living Expenses: Not hard at all  Food Insecurity: No Food Insecurity (11/21/2021)   Hunger Vital Sign    Worried About Running Out of Food in the Last Year: Never true    Ran Out of Food in the Last Year: Never true  Transportation Needs: No Transportation Needs (  11/21/2021)    PRAPARE - Hydrologist (Medical): No    Lack of Transportation (Non-Medical): No  Physical Activity: Sufficiently Active (12/19/2020)   Exercise Vital Sign    Days of Exercise per Week: 3 days    Minutes of Exercise per Session: 60 min  Stress: No Stress Concern Present (12/19/2020)   [place]    Feeling of Stress : Not at all  Social Connections: Socially Isolated (12/19/2020)   Social Connection and Isolation Panel [NHANES]    Frequency of Communication with Friends and Family: Once a week    Frequency of Social Gatherings with Friends and Family: Once a week    Attends Religious Services: Never    Marine scientist or Organizations: No    Attends Archivist Meetings: Never    Marital Status: Married  Human resources officer Violence: Not At Risk (11/21/2021)   Humiliation, Afraid, Rape, and Kick questionnaire    Fear of Current or Ex-Partner: No    Emotionally Abused: No    Physically Abused: No    Sexually Abused: No    Review of Systems: Pertinent positive and negative review of systems were noted in the above HPI section.  All other review of systems was otherwise negative.   Physical Exam: Vital signs in last 24 hours: Temp:  [97.7 F (36.5 C)-98.3 F (36.8 C)] 97.8 F (36.6 C) (10/23 0738) Pulse Rate:  [67-82] 73 (10/23 0738) Resp:  [11-23] 15 (10/23 0738) BP: (98-138)/(39-104) 122/60 (10/23 0738) SpO2:  [95 %-100 %] 98 % (10/23 0738) Weight:  [70.3 kg] 70.3 kg (10/22 1018)   General:   Alert,  Well-developed, well-nourished, elderly white male pleasant and cooperative in NAD.  Patient looks to his wife to help answer questions Head:  Normocephalic and atraumatic. Eyes:  Sclera clear, no icterus.   Conjunctiva pale Ears:  Normal auditory acuity. Nose:  No deformity, discharge,  or lesions. Mouth:  No deformity or lesions.   Neck:  Supple; no masses or  thyromegaly. Lungs:  Clear throughout to auscultation.   No wheezes, crackles, or rhonchi. Heart:  Regular rate and rhythm; no murmurs, clicks, rubs,  or gallops.,  Sternal scar Abdomen:  Soft,nontender, BS active,nonpalp mass or hsm.   Rectal: Not done-wife had pictures of the melena. Msk:  Symmetrical without gross deformities. . Pulses:  Normal pulses noted. Extremities:  Without clubbing or edema. Neurologic:  Alert and  oriented x4;  grossly normal neurologically. Skin:  Intact without significant lesions or rashes.. Psych:  Alert and cooperative. Normal mood and affect.  Intake/Output from previous day: 10/22 0701 - 10/23 0700 In: 573.9 [I.V.:210.6; Blood:363.3] Out: 900 [Urine:900] Intake/Output this shift: Total I/O In: 406.7 [Blood:406.7] Out: -   Lab Results: Recent Labs    11/29/21 1028 11/29/21 1958 11/30/21 0240  WBC 6.7 8.3 8.0  HGB 7.1* 6.6* 7.1*  HCT 22.4* 21.4* 22.2*  PLT 303 293 256   BMET Recent Labs    11/29/21 1028 11/30/21 0240  NA 134* 140  K 4.2 4.3  CL 103 112*  CO2 18* 21*  GLUCOSE 368* 173*  BUN 90* 71*  CREATININE 1.80* 1.77*  CALCIUM 9.2 8.6*   LFT Recent Labs    11/30/21 0240  PROT 5.1*  ALBUMIN 2.8*  AST 10*  ALT 13  ALKPHOS 49  BILITOT 0.9   PT/INR No results for input(s): "LABPROT", "INR" in the last 72 hours. Hepatitis  Panel No results for input(s): "HEPBSAG", "HCVAB", "HEPAIGM", "HEPBIGM" in the last 72 hours.     IMPRESSION:  #61 76 year old white male with recent admission mid October with acute GI bleed secondary to duodenal ulcer.  There is no active bleeding at the time of EGD, no stigmata and therefore no endoscopic therapy was done.  Also noted several additional shallow ulcers in the duodenal bulb to second portion. Patient presented yesterday to the emergency room with recurrent lightheadedness, shortness of breath and melena, consistent with a recurrent acute bleed, very likely secondary to the recently  documented cratered duodenal ulcer  #2 anemia acute on subacute secondary to recurrent bleed-transfused x 2 units thus far  Iron deficiency - documented with recent admit - had ferrlecit x 2  during that admission - was to start oral iron today  #3 acute kidney injury-on chronic kidney disease stage IIIb #4 aortic stenosis status post TAVR #5 adult onset diabetes mellitus #6.  History of prostate CA #7.  History of B12 deficiency-dueor B12 injection today  PLAN: Keep n.p.o. this morning Patient has been scheduled for EGD with Dr. Hilarie Fredrickson this afternoon.  Procedure was discussed in detail with the patient and wife including indications risk and benefits and they are agreeable to proceed.  Continue serial hemoglobins and transfuse as indicated to keep hemoglobin in the 8 range due to age, comorbidities and symptomatic state IV PPI infusion Will order B12 injection at wife's request/due today Further recommendations pending findings at EGD      Allyana Vogan EsterwoodPA-C  11/30/2021, 8:43 AM

## 2021-11-30 NOTE — Anesthesia Procedure Notes (Signed)
Procedure Name: MAC Date/Time: 11/30/2021 2:29 PM  Performed by: Lollie Sails, CRNAPre-anesthesia Checklist: Patient identified, Emergency Drugs available, Suction available, Patient being monitored and Timeout performed Oxygen Delivery Method: Nasal cannula Placement Confirmation: positive ETCO2

## 2021-11-30 NOTE — TOC Initial Note (Signed)
Transition of Care [facility]) - Initial/Assessment Note    Patient Details  Name: Daryl Webb MRN: [id number] Date of Birth: 11-20-1945  [facility]) CM/SW Contact:    Leeroy Cha, RN Phone Number: 11/30/2021, 8:56 AM  Clinical Narrative:                  Transition of Care [facility]) Screening Note   Patient Details  Name: Daryl Webb Date of Birth: 07-29-1945   Transition of Care [facility]) CM/SW Contact:    Leeroy Cha, RN Phone Number: 11/30/2021, 8:56 AM    Transition of Care Department [facility]) has reviewed patient and no TOC needs have been identified at this time. We will continue to monitor patient advancement through interdisciplinary progression rounds. If new patient transition needs arise, please place a TOC consult.    Expected Discharge Plan: Home/Self Care Barriers to Discharge: Continued Medical Work up   Patient Goals and CMS Choice Patient states their goals for this hospitalization and ongoing recovery are:: to go home CMS Medicare.gov Compare Post Acute Care list provided to:: Patient    Expected Discharge Plan and Services Expected Discharge Plan: Home/Self Care   Discharge Planning Services: CM Consult   Living arrangements for the past 2 months: Single Family Home                                      Prior Living Arrangements/Services Living arrangements for the past 2 months: Single Family Home Lives with:: Spouse Patient language and need for interpreter reviewed:: Yes Do you feel safe going back to the place where you live?: Yes            Criminal Activity/Legal Involvement Pertinent to Current Situation/Hospitalization: No - Comment as needed  Activities of Daily Living      Permission Sought/Granted                  Emotional Assessment Appearance:: Appears stated age Attitude/Demeanor/Rapport: Engaged Affect (typically observed): Calm Orientation: : Oriented to Self, Oriented  to Place, Oriented to  Time, Oriented to Situation Alcohol / Substance Use: Tobacco Use (tobacco hx quit 41 years ago, no etoh or drug use) Psych Involvement: No (comment)  Admission diagnosis:  GI bleeding [K92.2] Gastrointestinal hemorrhage associated with duodenal ulcer [K26.4] Symptomatic anemia [D64.9] Patient Active Problem List   Diagnosis Date Noted   Symptomatic anemia 11/29/2021   Confusion 11/21/2021   GI bleeding 11/20/2021   Insomnia 04/22/2021   S/P TAVR (transcatheter aortic valve replacement) 08/12/2020   Severe aortic stenosis    Atherosclerosis of aorta ([place]) 05/19/2020   CKD (chronic kidney disease), stage III ([place]) 07/03/2017   Flank pain 02/12/2016   Essential hypertension 02/12/2016   Dyslipidemia (high LDL; low HDL) 01/19/2016   PUD (peptic ulcer disease) 01/19/2016   Vertigo 05/02/2014   Low back pain 07/20/2012   Iron deficiency anemia due to chronic blood loss 07/20/2012   Type 2 diabetes mellitus with renal manifestations ([place]) 09/01/2011   History of prostate cancer    History of urethral stricture    Generalized osteoarthritis of multiple sites    PCP:  [name], [name], MD Pharmacy:   [id number] [place] [id number]: [phone]: [phone]    Social Determinants of Health (SDOH) Interventions    Readmission Risk Interventions  Row Labels 11/23/2021   10:43 AM 11/22/2021   10:42 AM  Readmission Risk Prevention Plan   Section Header. No data exists in this row.    Post Dischage Appt   Complete Complete  Medication Screening   Complete Complete  Transportation Screening   Complete Complete

## 2021-11-30 NOTE — Plan of Care (Signed)

## 2021-11-30 NOTE — Op Note (Signed)
[facility] Patient Name: Daryl Webb Procedure Date: 11/30/2021 MRN: [id number] Attending MD: Jerene Bears , MD Date of Birth: May 29, 1945 CSN: [id number] Age: 76 Admit Type: Outpatient Procedure:                Upper GI endoscopy Indications:              Acute post hemorrhagic anemia, Melena, Active                            gastrointestinal bleeding, recent hx of duodenal                            ulcers seen at EGD 11/21/2021 Providers:                Lajuan Lines. Hilarie Fredrickson, MD, Dulcy Fanny, Theodora Blow,                            Technician Referring MD:             [facility] Medicines:                Monitored Anesthesia Care Complications:            No immediate complications. Estimated Blood Loss:     Estimated blood loss: none. Procedure:                Pre-Anesthesia Assessment:                           - Prior to the procedure, a History and Physical                            was performed, and patient medications and                            allergies were reviewed. The patient's tolerance of                            previous anesthesia was also reviewed. The risks                            and benefits of the procedure and the sedation                            options and risks were discussed with the patient.                            All questions were answered, and informed consent                            was obtained. Prior Anticoagulants: The patient has                            taken no previous anticoagulant or antiplatelet  agents. ASA Grade Assessment: III - A patient with                            severe systemic disease. After reviewing the risks                            and benefits, the patient was deemed in                            satisfactory condition to undergo the procedure.                           After obtaining informed consent, the endoscope was                             passed under direct vision. Throughout the                            procedure, the patient's blood pressure, pulse, and                            oxygen saturations were monitored continuously. The                            [id number]-[id number] ([id number]) Olympus endoscope was introduced                            through the mouth, and advanced to the second part                            of duodenum. The upper GI endoscopy was                            accomplished without difficulty. The patient                            tolerated the procedure well. Scope In: Scope Out: Findings:      The examined esophagus was normal.      Clotted and fresh red blood was found in the gastric body. This was       lavaged and cleared completely. No active gastric bleeding site seen.      A few erosions with no bleeding and no stigmata of recent bleeding were       found in the gastric body. These are most consistent with nearly healed       biopsy sites (from recent EGD).      Three cratered duodenal ulcers, 2 with visible vessels, and one actively       bleeding were found in the duodenal bulb and duodenal sweep. The largest       lesion was 10 mm in largest dimension. The gold probe was advanced but       due to the inability to maintain stable position for the time needed to       properly cauterize this modality was not used. To stop active bleeding,  three hemostatic clips were successfully placed (MR conditional). One       clip on the actively bleeding ulcer with good effect (blanching noted).       Two clips applied to the duodenal sweep ulceration. There was no       bleeding at the end of the maneuver.      The second portion of the duodenum past the sweep was normal. Impression:               - Normal esophagus.                           - Clotted blood in the gastric body. Cleared and                            found to be refluxing from active duodenal bulb                             ulcer bleed.                           - Mostly healed gastric biopsy sites without                            bleeding.                           - Bleeding duodenal ulcers with a visible vessels.                            Clips (MR conditional) were placed.                           - Normal second portion of the duodenum.                           - No specimens collected. Moderate Sedation:      N/A Recommendation:           - Return patient to hospital ward for ongoing care.                           - Advance diet as tolerated. Max of soft diet.                           - Continue present medications including PPI                            infusion at least until tomorrow. Once stopped                            change back to PO BID for at least 8 more weeks.                            Remain off aspirin and no NSAIDs.                           -  Closely monitor Hgb. Procedure Code(s):        --- Professional ---                           [phone], Esophagogastroduodenoscopy, flexible,                            transoral; with control of bleeding, any method Diagnosis Code(s):        --- Professional ---                           K92.2, Gastrointestinal hemorrhage, unspecified                           K25.9, Gastric ulcer, unspecified as acute or                            chronic, without hemorrhage or perforation                           K26.4, Chronic or unspecified duodenal ulcer with                            hemorrhage                           D62, Acute posthemorrhagic anemia                           K92.1, Melena (includes Hematochezia) CPT copyright 2019 American Medical Association. All rights reserved. The codes documented in this report are preliminary and upon coder review may  be revised to meet current compliance requirements. Jerene Bears, MD 11/30/2021 3:11:59 PM This report has been signed electronically. Number of Addenda: 0

## 2021-11-30 NOTE — Transfer of Care (Signed)
Immediate Anesthesia Transfer of Care Note  Patient: Daryl Webb  Procedure(s) Performed: ESOPHAGOGASTRODUODENOSCOPY (EGD) WITH PROPOFOL HEMOSTASIS CLIP PLACEMENT  Patient Location: PACU and Endoscopy Unit  Anesthesia Type:MAC  Level of Consciousness: awake, drowsy and responds to stimulation  Airway & Oxygen Therapy: Patient Spontanous Breathing and Patient connected to nasal cannula oxygen  Post-op Assessment: Report given to RN and Post -op Vital signs reviewed and stable  Post vital signs: Reviewed and stable  Last Vitals:  Vitals Value Taken Time  BP 108/29 11/30/21 1502  Temp    Pulse 65 11/30/21 1503  Resp 21 11/30/21 1503  SpO2 99 % 11/30/21 1503  Vitals shown include unvalidated device data.  Last Pain:  Vitals:   11/30/21 1334  TempSrc: Temporal  PainSc: 0-No pain         Complications: No notable events documented.

## 2021-12-01 DIAGNOSIS — K921 Melena: Secondary | ICD-10-CM | POA: Diagnosis not present

## 2021-12-01 DIAGNOSIS — Z8546 Personal history of malignant neoplasm of prostate: Secondary | ICD-10-CM | POA: Diagnosis not present

## 2021-12-01 DIAGNOSIS — N1832 Chronic kidney disease, stage 3b: Secondary | ICD-10-CM | POA: Diagnosis not present

## 2021-12-01 DIAGNOSIS — E785 Hyperlipidemia, unspecified: Secondary | ICD-10-CM | POA: Diagnosis not present

## 2021-12-01 LAB — GLUCOSE, CAPILLARY
Glucose-Capillary: 146 mg/dL — ABNORMAL HIGH (ref 70–99)
Glucose-Capillary: 151 mg/dL — ABNORMAL HIGH (ref 70–99)
Glucose-Capillary: 186 mg/dL — ABNORMAL HIGH (ref 70–99)
Glucose-Capillary: 191 mg/dL — ABNORMAL HIGH (ref 70–99)
Glucose-Capillary: 278 mg/dL — ABNORMAL HIGH (ref 70–99)
Glucose-Capillary: 314 mg/dL — ABNORMAL HIGH (ref 70–99)
Glucose-Capillary: 374 mg/dL — ABNORMAL HIGH (ref 70–99)

## 2021-12-01 LAB — COMPREHENSIVE METABOLIC PANEL
ALT: 17 U/L (ref 0–44)
AST: 17 U/L (ref 15–41)
Albumin: 2.7 g/dL — ABNORMAL LOW (ref 3.5–5.0)
Alkaline Phosphatase: 45 U/L (ref 38–126)
Anion gap: 6 (ref 5–15)
BUN: 52 mg/dL — ABNORMAL HIGH (ref 8–23)
CO2: 21 mmol/L — ABNORMAL LOW (ref 22–32)
Calcium: 8.7 mg/dL — ABNORMAL LOW (ref 8.9–10.3)
Chloride: 114 mmol/L — ABNORMAL HIGH (ref 98–111)
Creatinine, Ser: 1.57 mg/dL — ABNORMAL HIGH (ref 0.61–1.24)
GFR, Estimated: 45 mL/min — ABNORMAL LOW (ref >60)
Glucose, Bld: 155 mg/dL — ABNORMAL HIGH (ref 70–99)
Potassium: 3.6 mmol/L (ref 3.5–5.1)
Sodium: 141 mmol/L (ref 135–145)
Total Bilirubin: 0.6 mg/dL (ref 0.3–1.2)
Total Protein: 5.3 g/dL — ABNORMAL LOW (ref 6.5–8.1)

## 2021-12-01 LAB — CBC WITH DIFFERENTIAL/PLATELET
Abs Immature Granulocytes: 0.04 10*3/uL (ref 0.00–0.07)
Basophils Absolute: 0 10*3/uL (ref 0.0–0.1)
Basophils Relative: 0 %
Eosinophils Absolute: 0.3 10*3/uL (ref 0.0–0.5)
Eosinophils Relative: 4 %
HCT: 23.5 % — ABNORMAL LOW (ref 39.0–52.0)
Hemoglobin: 7.5 g/dL — ABNORMAL LOW (ref 13.0–17.0)
Immature Granulocytes: 1 %
Lymphocytes Relative: 38 %
Lymphs Abs: 2.9 10*3/uL (ref 0.7–4.0)
MCH: 31 pg (ref 26.0–34.0)
MCHC: 31.9 g/dL (ref 30.0–36.0)
MCV: 97.1 fL (ref 80.0–100.0)
Monocytes Absolute: 0.8 10*3/uL (ref 0.1–1.0)
Monocytes Relative: 10 %
Neutro Abs: 3.5 10*3/uL (ref 1.7–7.7)
Neutrophils Relative %: 47 %
Platelets: 266 10*3/uL (ref 150–400)
RBC: 2.42 MIL/uL — ABNORMAL LOW (ref 4.22–5.81)
RDW: 17.6 % — ABNORMAL HIGH (ref 11.5–15.5)
WBC: 7.5 10*3/uL (ref 4.0–10.5)
nRBC: 0 % (ref 0.0–0.2)

## 2021-12-01 LAB — HEMOGLOBIN AND HEMATOCRIT, BLOOD
HCT: 23.3 % — ABNORMAL LOW (ref 39.0–52.0)
HCT: 24.6 % — ABNORMAL LOW (ref 39.0–52.0)
Hemoglobin: 7.3 g/dL — ABNORMAL LOW (ref 13.0–17.0)
Hemoglobin: 7.9 g/dL — ABNORMAL LOW (ref 13.0–17.0)

## 2021-12-01 LAB — PREPARE RBC (CROSSMATCH)

## 2021-12-01 LAB — PHOSPHORUS: Phosphorus: 3.4 mg/dL (ref 2.5–4.6)

## 2021-12-01 LAB — MAGNESIUM: Magnesium: 2.1 mg/dL (ref 1.7–2.4)

## 2021-12-01 MED ORDER — MELATONIN 3 MG PO TABS
3.0000 mg | ORAL_TABLET | Freq: Once | ORAL | Status: AC
  Administered 2021-12-01: 3 mg via ORAL

## 2021-12-01 MED ORDER — INSULIN GLARGINE-YFGN 100 UNIT/ML ~~LOC~~ SOLN
10.0000 [IU] | Freq: Every day | SUBCUTANEOUS | Status: DC
  Administered 2021-12-01 – 2021-12-02 (×2): 10 [IU] via SUBCUTANEOUS
  Filled 2021-12-01 (×3): qty 0.1

## 2021-12-01 MED ORDER — SODIUM CHLORIDE 0.9% IV SOLUTION: Freq: Once | INTRAVENOUS | Status: AC

## 2021-12-01 MED ORDER — MELATONIN 3 MG PO TABS
ORAL_TABLET | ORAL | Status: AC
  Filled 2021-12-01: qty 1

## 2021-12-01 NOTE — Plan of Care (Signed)

## 2021-12-01 NOTE — Progress Notes (Signed)
PROGRESS NOTE    Daryl Webb  YHC:[id number] DOB: 06/19/1945 DOA: 11/29/2021 PCP: [name], [name], MD   Brief Narrative:  The patient is an elderly 77 year old Caucasian male with past medical history significant for but not limited to hyperlipidemia, prostate cancer, history of diabetes mellitus type 2, history of anemia, history of recent GI bleeding as well as peptic ulcer disease, history of chronic kidney disease stage IIIb, history of aortic stenosis status post TAVR who presented with shortness of breath and dark stools.  Of note he was recently admitted from October 13 until October 16 with worsening anemia and GI bleeding.  EGD was done and showed a nonbleeding ulcer at that time of examination and patient was given a couple days of IV PPI and then discharged on p.o. PPI.  Since returning home his recent become short of breath and has had episodes of melena as well as episode of nausea with vomiting but no bloody or coffee-ground emesis in his vomitus.  He states that his vomitus did appear brown but appears mostly like food that he been eating and he reports feeling weak.  He came to the ED and was worked up and was found to have a hemoglobin of 7.1 and received a liter bolus.  He was a started on a PPI drip and GI was consulted.  Given his drop in hemoglobin he was typed and screened and transfused 2 units of PRBCs.  Given his drop in hemoglobin plan is for EGD this afternoon and is currently n.p.o.  He received Ferrlecit x2 on the prior admission.  GI recommending continuing serial hemoglobin and transfusing as indicated to keep hemoglobin in the range of 8 and above due to his age comorbidities and symptomatic state.  B12 was also ordered.   EGD was done and showed a normal esophagus and clotted blood in the gastric body this is cleared and there is found to be a refluxing from active duodenal bulb ulcer bleed along with a mostly healed gastric biopsy site without bleeding.  Bleeding  duodenal ulcers with visible vessels were seen and clips were placed in the second portion of the duodenum was normal.  GI recommended continuing liquid diet and advancing to soft tomorrow.  He was given another 1 unit PRBCs given that his hemoglobin dropped.  This is the third unit of PRBCs the patient was transfused.  GI is also recommending continuing PPI infusion with a goal of 72 hours and then twice daily for least 2 to 3 months and avoiding NSAIDs monitor hemoglobin closely.  Assessment and Plan: No notes have been filed under this hospital service. Service: Hospitalist  Upper GI GI bleeding from duodenal ulcer Symptomatic anemia superimposed on chronic iron deficiency anemia and B12 deficiency Peptic ulcer disease -Presenting with shortness of breath and melanotic stools.  Did have nausea and vomiting but this was without coffee-ground emesis -Recently admitted from October 13 until 16th with GI bleeding and found to have an bleeding duodenal ulcer at the time of EGD. -Presumed to have an upper GI bleeding given ulcer noted on last EGD and elevated BUN and melanotic stools.  Hemoglobin currently 7.1 on admission down from 8.5 on discharge 6 days ago. -GI consulted in the ED and will see the patient in the morning.  Agree with PPI drip and will continue - Patient currently being monitored in stepdown unit - Appreciate gastroenterology recommendations -Patient's hemoglobin/hematocrit went from 6.6/21.4 -> 7.1/22.2 -> 7.8/24.7 -> 7.5/23.6 -> 7.3/23.3 -> 7.9/24.6 on  last check -We will give vitamin B12 injection today -Patient's hemoglobin/hematocrit needs to be monitored carefully monitor her serial hemoglobins and transfuse as indicated to keep hemoglobin level 8 -EGD done and showed "The examined esophagus was normal.  Clotted and fresh red blood was found in the gastric body. This was lavaged and cleared completely. No active gastric bleeding site seen. A few erosions with no bleeding and  no stigmata of recent bleeding were found in the gastric body. These are most consistent with nearly healed biopsy sites (from recent EGD). Three cratered duodenal ulcers, 2 with visible vessels, and one actively bleeding were found in the duodenal bulb and duodenal sweep. The largest lesion was 10 mm in largest dimension. The gold probe was advanced but due to the inability to maintain stable position for the time needed to properly cauterize this modality was not used. To stop active bleeding, three hemostatic clips were successfully placed (MR conditional). One clip on the actively bleeding ulcer with good effect (blanching noted). Two clips applied to the duodenal sweep ulceration. There was no  bleeding at the end of the maneuver. The second portion of the duodenum past the sweep was normal." -Continue IV PPI drip for 72 hours per GI and then transition to p.o. twice daily for 8 to 12 weeks -Initially was n.p.o. for EGD and now diet has been advanced to a FULL liquid diet and GI recommends continuing today and advancing to soft tomorrow   Hyperlipidemia -Continue home Atorvastatin when no longer Npo   Hypertension -Blood pressure currently in the 67T to 245 systolic in the ED. -Holding home losartan-hydrochlorothiazide, amlodipine, metoprolol in the setting of low normal blood pressure and GI bleeding. -Continue to monitor blood pressures per protocol -Last blood pressure reading was 809/98   CKD 3B Metabolic acidosis Creatinine near baseline at 1.8.  BUN elevated likely from upper GI bleeding as above. -Patient's CO2 is now 21, anion gap is now 6, chloride level is 114 -Patient's BUNs/creatinine went from 90/1.80 -> 71/1.77 -> 52/1.57 with a GFR of 45 -Avoid further nephrotoxic medications, contrast dyes, hypotension and dehydration to ensure adequate renal perfusion and will need to renally dose medications -Repeat CMP in the a.m. and monitor renal function carefully   Diabetes Mellitus  Type II -Continue Sensitive NovoLog/scale insulin AC -Holding his home Farxiga and metformin but can resume at discharge and patient will need to follow-up with endocrine Dr. Buddy Duty at discharge -We will be adding Semglee 10 units subcu daily -CBGs ranging from 146-374 -Continue monitor blood sugars per protocol if necessary will adjust as necessary   Hypoalbuminemia -Patients albumin level has gone from 3.5 -> 2.8 -> 2.7 -Continue to monitor and trend and repeat CMP in the AM   Stenosis status post TAVR -Noted his metoprolol has been held and not on any anticoagulation from my I can see -GI recommending avoiding aspirin or NSAIDs   History of prostate cancer - Noted and can be following up in outpatient setting  DVT prophylaxis: SCDs Start: 11/29/21 1907    Code Status: Full Code Family Communication: Discussed with his wife at bedside  Disposition Plan:  Level of care: Stepdown Status is: Inpatient Remains inpatient appropriate because: Remains on a Protonix drip and GI recommending continue to observe overnight and advancing diet to soft tomorrow if stable   Consultants:  Gastroenterology  Procedures:  EGD Findings:      The examined esophagus was normal.      Clotted and fresh red blood  was found in the gastric body. This was       lavaged and cleared completely. No active gastric bleeding site seen.      A few erosions with no bleeding and no stigmata of recent bleeding were       found in the gastric body. These are most consistent with nearly healed       biopsy sites (from recent EGD).      Three cratered duodenal ulcers, 2 with visible vessels, and one actively       bleeding were found in the duodenal bulb and duodenal sweep. The largest       lesion was 10 mm in largest dimension. The gold probe was advanced but       due to the inability to maintain stable position for the time needed to       properly cauterize this modality was not used. To stop active  bleeding,       three hemostatic clips were successfully placed (MR conditional). One       clip on the actively bleeding ulcer with good effect (blanching noted).       Two clips applied to the duodenal sweep ulceration. There was no       bleeding at the end of the maneuver.      The second portion of the duodenum past the sweep was normal. Impression:               - Normal esophagus.                           - Clotted blood in the gastric body. Cleared and                            found to be refluxing from active duodenal bulb                            ulcer bleed.                           - Mostly healed gastric biopsy sites without                            bleeding.                           - Bleeding duodenal ulcers with a visible vessels.                            Clips (MR conditional) were placed.                           - Normal second portion of the duodenum.                           - No specimens collected. Moderate Sedation:      N/A Recommendation:           - Return patient to hospital ward for ongoing care.                           -  Advance diet as tolerated. Max of soft diet.                           - Continue present medications including PPI                            infusion at least until tomorrow. Once stopped                            change back to PO BID for at least 8 more weeks.                            Remain off aspirin and no NSAIDs.  Antimicrobials:  Anti-infectives (From admission, onward)    None       Subjective: Seen and examined at bedside he is sitting in the chair and states that he felt hungry and was little agitated given that he was not getting anything to eat.  Blood count dropped a little bit so he is getting another unit of blood.  He denies any nausea or vomiting.  Wife is concerned about his follow-up with endocrinology given his labile blood pressures and request that Dr. Buddy Duty be called before he is discharged so  that we can arrange follow-up soon.  GI recommending continuing PPI for now.  Otherwise he is fairly stable and had a dark bowel movement but likely could have been old blood.  No other concerns or complaints at this time.  Objective: Vitals:   12/01/21 1500 12/01/21 1600 12/01/21 1640 12/01/21 1800  BP: (!) 156/68 136/69  (!) 140/45  Pulse: (!) 104 78 74 76  Resp: 20 (!) 22 18 (!) 25  Temp: 97.6 F (36.4 C) 97.7 F (36.5 C)    TempSrc: Oral Oral    SpO2: 99% 99% 98% 97%  Weight:      Height:        Intake/Output Summary (Last 24 hours) at 12/01/2021 1844 Last data filed at 12/01/2021 1800 Gross per 24 hour  Intake 696.31 ml  Output 850 ml  Net -153.69 ml   Filed Weights   11/29/21 1018  Weight: 70.3 kg   Examination: Physical Exam:  Constitutional: WN/WD Caucasian male no acute distress appears calm and comfortable now Respiratory: Diminished to auscultation bilaterally, no wheezing, rales, rhonchi or crackles. Normal respiratory effort and patient is not tachypenic. No accessory muscle use.  Unlabored breathing Cardiovascular: RRR, no murmurs / rubs / gallops. S1 and S2 auscultated.  Abdomen: Soft, non-tender, non-distended.  Bowel sounds positive.  GU: Deferred. Musculoskeletal: No clubbing / cyanosis of digits/nails. No joint deformity upper and lower extremities.  Skin: No rashes, lesions, ulcers on limited skin evaluation. No induration; Warm and dry.  Neurologic: CN 2-12 grossly intact with no focal deficits. Romberg sign and cerebellar reflexes not assessed.  Psychiatric: Normal judgment and insight. Alert and oriented x 3. Normal mood and appropriate affect.   Data Reviewed: I have personally reviewed following labs and imaging studies  CBC: Recent Labs  Lab 11/29/21 1028 11/29/21 1958 11/30/21 0240 11/30/21 0940 12/01/21 0311 12/01/21 1021 12/01/21 1633  WBC 6.7 8.3 8.0  --  7.5  --   --   NEUTROABS  --   --   --   --  3.5  --   --  HGB 7.1* 6.6* 7.1*  7.8* 7.5* 7.3* 7.9*  HCT 22.4* 21.4* 22.2* 24.7* 23.5* 23.3* 24.6*  MCV 96.6 100.9* 96.1  --  97.1  --   --   PLT 303 293 256  --  266  --   --    Basic Metabolic Panel: Recent Labs  Lab 11/29/21 1028 11/30/21 0240 12/01/21 0311  NA 134* 140 141  K 4.2 4.3 3.6  CL 103 112* 114*  CO2 18* 21* 21*  GLUCOSE 368* 173* 155*  BUN 90* 71* 52*  CREATININE 1.80* 1.77* 1.57*  CALCIUM 9.2 8.6* 8.7*  MG  --   --  2.1  PHOS  --   --  3.4   GFR: Estimated Creatinine Clearance: 37.4 mL/min (A) (by C-G formula based on SCr of 1.57 mg/dL (H)). Liver Function Tests: Recent Labs  Lab 11/29/21 1028 11/30/21 0240 12/01/21 0311  AST 8* 10* 17  ALT '10 13 17  '$ ALKPHOS 58 49 45  BILITOT 0.3 0.9 0.6  PROT 5.5* 5.1* 5.3*  ALBUMIN 3.5 2.8* 2.7*   No results for input(s): "LIPASE", "AMYLASE" in the last 168 hours. No results for input(s): "AMMONIA" in the last 168 hours. Coagulation Profile: No results for input(s): "INR", "PROTIME" in the last 168 hours. Cardiac Enzymes: No results for input(s): "CKTOTAL", "CKMB", "CKMBINDEX", "TROPONINI" in the last 168 hours. BNP (last 3 results) No results for input(s): "PROBNP" in the last 8760 hours. HbA1C: No results for input(s): "HGBA1C" in the last 72 hours. CBG: Recent Labs  Lab 12/01/21 0038 12/01/21 0312 12/01/21 0755 12/01/21 1228 12/01/21 1646  GLUCAP 146* 151* 186* 374* 278*   Lipid Profile: No results for input(s): "CHOL", "HDL", "LDLCALC", "TRIG", "CHOLHDL", "LDLDIRECT" in the last 72 hours. Thyroid Function Tests: No results for input(s): "TSH", "T4TOTAL", "FREET4", "T3FREE", "THYROIDAB" in the last 72 hours. Anemia Panel: No results for input(s): "VITAMINB12", "FOLATE", "FERRITIN", "TIBC", "IRON", "RETICCTPCT" in the last 72 hours. Sepsis Labs: No results for input(s): "PROCALCITON", "LATICACIDVEN" in the last 168 hours.  Recent Results (from the past 240 hour(s))  MRSA Next Gen by PCR, Nasal     Status: None   Collection  Time: 11/29/21  6:59 PM   Specimen: Nasal Mucosa; Nasal Swab  Result Value Ref Range Status   MRSA by PCR Next Gen NOT DETECTED NOT DETECTED Final    Comment: (NOTE) The GeneXpert MRSA Assay (FDA approved for NASAL specimens only), is one component of a comprehensive MRSA colonization surveillance program. It is not intended to diagnose MRSA infection nor to guide or monitor treatment for MRSA infections. Test performance is not FDA approved in patients less than 54 years old. Performed at [facility], [place] [address] [place], [place] [address]     Radiology Studies: No results found.  Scheduled Meds:  sodium chloride   Intravenous Once   sodium chloride   Intravenous Once   atorvastatin  40 mg Oral Daily   Chlorhexidine Gluconate Cloth  6 each Topical Daily   insulin aspart  0-9 Units Subcutaneous Q4H   [START ON 12/03/2021] pantoprazole  40 mg Intravenous Q12H   sodium chloride flush  3 mL Intravenous Q12H   Continuous Infusions:  pantoprazole 8 mg/hr (12/01/21 1800)    LOS: 2 days   Roberts, DO [facility] Available via Epic secure chat 7am-7pm After these hours, please refer to coverage provider listed on [email or web address] 12/01/2021, 6:44 PM

## 2021-12-01 NOTE — Evaluation (Signed)
Occupational Therapy Evaluation Patient Details Name: Daryl Webb MRN: [id number] DOB: May 17, 1945 Today's Date: 12/01/2021   History of Present Illness Patient is a 76 year old male who presented from [facility] with shortness of breath, melena, weakness and lightheadedness. patient was found to have hgb of 7.1. patient was recently admitted from oct 13-16 for GI bleed with endoscopy revealing duodenal ulcer.   PMH: GERD, arthritis, prostate cancer, spinal stenosis.   Clinical Impression   Patient is a 76 year old male who was admitted for above. Patient was living at home with wife prior level with no AD. Patient was min A +2 for line management to transfer from edge of bed to opposite side of room with RW. Patient endorsed needed RW for balance and stability today. Patient was able to don socks in bed with set up.  Patient was noted to have decreased functional activity tolerance, decreased endurance, decreased standing balance, decreased safety awareness, and decreased knowledge of AD/AE impacting participation in ADLs.  Patient would continue to benefit from skilled OT services at this time while admitted d/c to address noted deficits in order to improve overall safety and independence in ADLs.        Recommendations for follow up therapy are one component of a multi-disciplinary discharge planning process, led by the attending physician.  Recommendations may be updated based on patient status, additional functional criteria and insurance authorization.   Follow Up Recommendations  No OT follow up    Assistance Recommended at Discharge Intermittent Supervision/Assistance  Patient can return home with the following A little help with walking and/or transfers;Assistance with cooking/housework;Assist for transportation    Functional Status Assessment  Patient has had a recent decline in their functional status and demonstrates the ability to make significant improvements in  function in a reasonable and predictable amount of time.  Equipment Recommendations       Recommendations for Other Services       Precautions / Restrictions Precautions Precautions: Fall Precaution Comments: monitor HR and BP Restrictions Weight Bearing Restrictions: No      Mobility Bed Mobility Overal bed mobility: Needs Assistance Bed Mobility: Supine to Sit     Supine to sit: Min guard, HOB elevated          Transfers                          Balance Overall balance assessment: Mild deficits observed, not formally tested                                         ADL either performed or assessed with clinical judgement   ADL Overall ADL's : Needs assistance/impaired Eating/Feeding: Set up;Sitting   Grooming: Set up;Sitting   Upper Body Bathing: Sitting;Minimal assistance   Lower Body Bathing: Minimal assistance;Sit to/from stand;Sitting/lateral leans   Upper Body Dressing : Minimal assistance;Sitting   Lower Body Dressing: Minimal assistance;Sit to/from stand;Sitting/lateral leans   Toilet Transfer: Minimal assistance;+2 for safety/equipment Toilet Transfer Details (indicate cue type and reason): bed to recliner on other side of bed. Toileting- Clothing Manipulation and Hygiene: Sit to/from stand;Minimal assistance               Vision Baseline Vision/History: 1 Wears glasses       Perception     Praxis      Pertinent Vitals/Pain Pain Assessment Pain  Assessment: No/denies pain     Hand Dominance     Extremity/Trunk Assessment Upper Extremity Assessment Upper Extremity Assessment: Overall WFL for tasks assessed   Lower Extremity Assessment Lower Extremity Assessment: Defer to PT evaluation   Cervical / Trunk Assessment Cervical / Trunk Assessment: Normal   Communication     Cognition Arousal/Alertness: Awake/alert Behavior During Therapy: WFL for tasks assessed/performed Overall Cognitive Status:  Within Functional Limits for tasks assessed                                 General Comments: wife present in room as well     General Comments       Exercises     Shoulder Instructions      Home Living Family/patient expects to be discharged to:: Private residence Living Arrangements: Spouse/significant other   Type of Home: House Home Access: Stairs to enter Technical brewer of Steps: 5 Entrance Stairs-Rails: Left;Right (too wide) Home Layout: Other (Comment) (step down into den)     Bathroom Shower/Tub: Walk-in shower         Home Equipment: Conservation officer, nature (2 wheels)          Prior Functioning/Environment Prior Level of Function : Independent/Modified Independent                        OT Problem List: Decreased activity tolerance;Cardiopulmonary status limiting activity;Decreased knowledge of precautions;Decreased knowledge of use of DME or AE;Decreased safety awareness      OT Treatment/Interventions: Self-care/ADL training;Therapeutic exercise;Neuromuscular education;Energy conservation;DME and/or AE instruction;Therapeutic activities;Balance training;Patient/family education    OT Goals(Current goals can be found in the care plan section) Acute Rehab OT Goals Patient Stated Goal: to go home soon OT Goal Formulation: With patient Time For Goal Achievement: 12/15/21 Potential to Achieve Goals: Fair  OT Frequency: Min 2X/week    Co-evaluation PT/OT/SLP Co-Evaluation/Treatment: Yes Reason for Co-Treatment: Complexity of the patient's impairments (multi-system involvement);For patient/therapist safety;To address functional/ADL transfers PT goals addressed during session: Mobility/safety with mobility OT goals addressed during session: ADL's and self-care      AM-PAC OT "6 Clicks" Daily Activity     Outcome Measure Help from another person eating meals?: A Little Help from another person taking care of personal grooming?: A  Little Help from another person toileting, which includes using toliet, bedpan, or urinal?: A Lot Help from another person bathing (including washing, rinsing, drying)?: A Lot Help from another person to put on and taking off regular upper body clothing?: A Little Help from another person to put on and taking off regular lower body clothing?: A Lot 6 Click Score: 15   End of Session Equipment Utilized During Treatment: Gait belt;Rolling walker (2 wheels) Nurse Communication: Mobility status;Other (comment) (HR during session)  Activity Tolerance: Patient tolerated treatment well Patient left: in chair;with call bell/phone within reach;with chair alarm set  OT Visit Diagnosis: Unsteadiness on feet (R26.81);Other abnormalities of gait and mobility (R26.89);Muscle weakness (generalized) (M62.81)                Time: 9629-5284 OT Time Calculation (min): 15 min Charges:  OT General Charges $OT Visit: 1 Visit OT Evaluation $OT Eval Moderate Complexity: 1 Mod  Jocelyn Lowery OTR/L, MS Acute Rehabilitation Department Office# [phone]   Marcellina Millin 12/01/2021, 12:31 PM

## 2021-12-01 NOTE — Inpatient Diabetes Management (Signed)
Inpatient Diabetes Program Recommendations  AACE/ADA: New Consensus Statement on Inpatient Glycemic Control (2015)  Target Ranges:  Prepandial:   less than 140 mg/dL      Peak postprandial:   less than 180 mg/dL (1-2 hours)      Critically ill patients:  140 - 180 mg/dL   Lab Results  Component Value Date   GLUCAP 186 (H) 12/01/2021   HGBA1C 7.2 (H) 11/23/2021    Review of Glycemic Control  Latest Reference Range & Units 11/30/21 20:01 12/01/21 00:38 12/01/21 03:12 12/01/21 07:55  Glucose-Capillary 70 - 99 mg/dL 437 (H) 146 (H) 151 (H) 186 (H)  (H): Data is abnormally high Diabetes history: type 2 DM Outpatient Diabetes medications: Farxiga 10 mg QD, Metformin 500 mg BID Current orders for Inpatient glycemic control: Novolog 0-9 units Q4H  Inpatient Diabetes Program Recommendations:    If to remain inpatient, consider adding Semglee 10 units QD.   Thanks, Bronson Curb, MSN, RNC-OB Diabetes Coordinator [phone] (8a-5p)

## 2021-12-01 NOTE — Anesthesia Postprocedure Evaluation (Signed)
Anesthesia Post Note  Patient: Daryl Webb  Procedure(s) Performed: ESOPHAGOGASTRODUODENOSCOPY (EGD) WITH PROPOFOL HEMOSTASIS CLIP PLACEMENT     Patient location during evaluation: PACU Anesthesia Type: MAC Level of consciousness: awake and alert Pain management: pain level controlled Vital Signs Assessment: post-procedure vital signs reviewed and stable Respiratory status: spontaneous breathing, nonlabored ventilation and respiratory function stable Cardiovascular status: blood pressure returned to baseline and stable Postop Assessment: no apparent nausea or vomiting Anesthetic complications: no   No notable events documented.  Last Vitals:  Vitals:   12/01/21 0600 12/01/21 0700  BP: (!) 115/51 (!) 113/43  Pulse: 71 72  Resp: 20 18  Temp:    SpO2: 98% 97%    Last Pain:  Vitals:   12/01/21 0500  TempSrc:   PainSc: Asleep                 Lynda Rainwater

## 2021-12-01 NOTE — Progress Notes (Addendum)
Patient ID: GENTLE HOGE, male   DOB: 1945-11-13, 76 y.o.   MRN: [id number]    Progress Note   Subjective   Day#2 CC; melena, anemia, shortness of breath-very recent admit with GI bleed  PPI infusion  EGD yesterday-clotted and fresh blood in the gastric body no active gastric bleeding site seen erosions in the gastric body-3 cratered duodenal ulcers, 2 with visible vessels 1 actively bleeding-largest was treated with gold probe, then clips 2 other clips applied to the duodenal sweep ulcer  Labs today-hemoglobin 7.5/hematocrit 23.5-overall stable since yesterday  Wife at bedside, patient says he feels okay irritable because he has not had anything to eat, waiting for breakfast, some gnawing sensation in the stomach no pain, no nausea or vomiting, no bowel movements.    Objective   Vital signs in last 24 hours: Temp:  [97.6 F (36.4 C)-98.3 F (36.8 C)] 97.6 F (36.4 C) (10/24 0356) Pulse Rate:  [66-143] 77 (10/24 0800) Resp:  [14-26] 20 (10/24 0800) BP: (106-147)/(29-67) 137/55 (10/24 0800) SpO2:  [92 %-100 %] 98 % (10/24 0800) Last BM Date : 11/29/21 General:    Elderly white male in NAD Heart:  Regular rate and rhythm; no murmurs Lungs: Respirations even and unlabored, lungs CTA bilaterally Abdomen:  Soft, nontender and nondistended. Normal bowel sounds. Extremities:  Without edema. Neurologic:  Alert and oriented,  grossly normal neurologically. Psych:  Cooperative. Normal mood and affect.  Intake/Output from previous day: 10/23 0701 - 10/24 0700 In: 1106.1 [I.V.:699.4; Blood:406.7] Out: 850 [Urine:850] Intake/Output this shift: Total I/O In: 20 [I.V.:20] Out: -   Lab Results: Recent Labs    11/29/21 1958 11/30/21 0240 11/30/21 0940 12/01/21 0311  WBC 8.3 8.0  --  7.5  HGB 6.6* 7.1* 7.8* 7.5*  HCT 21.4* 22.2* 24.7* 23.5*  PLT 293 256  --  266   BMET Recent Labs    11/29/21 1028 11/30/21 0240 12/01/21 0311  NA 134* 140 141  K 4.2 4.3 3.6  CL  103 112* 114*  CO2 18* 21* 21*  GLUCOSE 368* 173* 155*  BUN 90* 71* 52*  CREATININE 1.80* 1.77* 1.57*  CALCIUM 9.2 8.6* 8.7*   LFT Recent Labs    12/01/21 0311  PROT 5.3*  ALBUMIN 2.7*  AST 17  ALT 17  ALKPHOS 45  BILITOT 0.6   PT/INR No results for input(s): "LABPROT", "INR" in the last 72 hours.       Assessment / Plan:    #64 76 year old white male with recurrent acute GI bleed after very recent hospitalization for acute GI bleed with EGD on 11/21/2021. Found at that time to have a duodenal ulcer-no active bleeding at time of the EGD and endoscopic intervention not done Apsey is negative for H. pylori  EGD yesterday with finding of a few gastric erosions, and 3 cratered duodenal ulcers, 2 with visible vessels 1 was actively bleeding in the duodenal bulb treated with gold probe with an Endo Clip x3, 1 other duodenal ulcer also treated with Endo Clip.  Patient has been stable, no further active bleeding Hemoglobin 7.5 this a.m. after transfusions yesterday  #2 anemia acute and subacute secondary to GI bleed #3 iron deficiency-received Ferrlecit x2 with recent admission-we will start oral iron when taking p.o.'s  #4 acute kidney injury on chronic kidney disease stage III improving #5 aortic stenosis status post TAVR #6 adult onset diabetes mellitus #7.  History of prostate cancer #8  History of B12 deficiency received B12 injection yesterday  Plan; full liquid diet today, advance to soft diet tomorrow Continue PPI infusion 72 hours total then twice daily PPI for 8 to 12 weeks To remain off aspirin, no NSAIDs   Principal Problem:   GI bleeding Active Problems:   History of prostate cancer   Type 2 diabetes mellitus with renal manifestations (HCC)   Iron deficiency anemia due to chronic blood loss   Dyslipidemia (high LDL; low HDL)   PUD (peptic ulcer disease)   CKD (chronic kidney disease), stage III (HCC)   S/P TAVR (transcatheter aortic valve replacement)    Symptomatic anemia     LOS: 2 days   Satrina Magallanes PA-C 12/01/2021, 8:33 AM

## 2021-12-01 NOTE — Evaluation (Signed)
Physical Therapy Evaluation Patient Details Name: Daryl Webb MRN: [id number] DOB: 19-Jun-1945 Today's Date: 12/01/2021  History of Present Illness  Patient is a 76 year old male who presented from [facility] with shortness of breath, melena, weakness and lightheadedness. patient was found to have hgb of 7.1. patient was recently admitted from oct 13-16 for GI bleed with endoscopy revealing duodenal ulcer.   PMH: GERD, arthritis, prostate cancer, spinal stenosis.  Clinical Impression  Patient admitted for above medical problems. Patient eager to mobilize. BP monitored  136/62 sup, 135/63 after ambulated x 20.  Patient reported feeling dizzy, "washed out.".  HR noted to jump to 131 briefly., otherwise in 90's.  Pt admitted with above diagnosis.  Pt currently with functional limitations due to the deficits listed below (see PT Problem List). Pt will benefit from skilled PT to increase their independence and safety with mobility to allow discharge to the venue listed below.        Recommendations for follow up therapy are one component of a multi-disciplinary discharge planning process, led by the attending physician.  Recommendations may be updated based on patient status, additional functional criteria and insurance authorization.  Follow Up Recommendations No PT follow up      Assistance Recommended at Discharge Intermittent Supervision/Assistance  Patient can return home with the following  A little help with walking and/or transfers;A little help with bathing/dressing/bathroom;Assistance with cooking/housework;Assist for transportation;Help with stairs or ramp for entrance    Equipment Recommendations None recommended by PT  Recommendations for Other Services       Functional Status Assessment Patient has had a recent decline in their functional status and demonstrates the ability to make significant improvements in function in a reasonable and predictable amount of time.      Precautions / Restrictions Precautions Precautions: Fall Precaution Comments: monitor HR and BP Restrictions Weight Bearing Restrictions: No      Mobility  Bed Mobility Overal bed mobility: Independent Bed Mobility: Supine to Sit     Supine to sit: Min guard, HOB elevated          Transfers Overall transfer level: Needs assistance Equipment used: Rolling walker (2 wheels) Transfers: Sit to/from Stand Sit to Stand: Min assist           General transfer comment: cues for safety    Ambulation/Gait Ambulation/Gait assistance: Min assist, +2 safety/equipment Gait Distance (Feet): 20 Feet Assistive device: Rolling walker (2 wheels) Gait Pattern/deviations: Step-through pattern, Decreased stride length       General Gait Details: close guard, dizziness  Stairs            Wheelchair Mobility    Modified Rankin (Stroke Patients Only)       Balance Overall balance assessment: Mild deficits observed, not formally tested                                           Pertinent Vitals/Pain Pain Assessment Pain Assessment: No/denies pain    Home Living Family/patient expects to be discharged to:: Private residence Living Arrangements: Spouse/significant other Available Help at Discharge: Family;Available 24 hours/day Type of Home: House Home Access: Stairs to enter Entrance Stairs-Rails: Chemical engineer of Steps: 5   Home Layout: Other (Comment) Home Equipment: Rolling Walker (2 wheels)      Prior Function Prior Level of Function : Independent/Modified Independent  Hand Dominance   Dominant Hand: Right    Extremity/Trunk Assessment   Upper Extremity Assessment Upper Extremity Assessment: Overall WFL for tasks assessed    Lower Extremity Assessment Lower Extremity Assessment: Generalized weakness    Cervical / Trunk Assessment Cervical / Trunk Assessment: Normal   Communication   Communication: No difficulties  Cognition Arousal/Alertness: Awake/alert Behavior During Therapy: WFL for tasks assessed/performed Overall Cognitive Status: Within Functional Limits for tasks assessed                                 General Comments: wife present in room as well        General Comments      Exercises     Assessment/Plan    PT Assessment Patient needs continued PT services  PT Problem List Decreased strength;Decreased activity tolerance;Decreased mobility;Cardiopulmonary status limiting activity;Decreased knowledge of use of DME;Decreased knowledge of precautions       PT Treatment Interventions DME instruction;Stair training;Therapeutic activities;Gait training;Functional mobility training;Therapeutic exercise;Patient/family education    PT Goals (Current goals can be found in the Care Plan section)  Acute Rehab PT Goals Patient Stated Goal: go home PT Goal Formulation: With patient/family Time For Goal Achievement: 12/15/21 Potential to Achieve Goals: Good    Frequency Min 3X/week     Co-evaluation PT/OT/SLP Co-Evaluation/Treatment: Yes Reason for Co-Treatment: Complexity of the patient's impairments (multi-system involvement);For patient/therapist safety PT goals addressed during session: Mobility/safety with mobility OT goals addressed during session: ADL's and self-care       AM-PAC PT "6 Clicks" Mobility  Outcome Measure Help needed turning from your back to your side while in a flat bed without using bedrails?: A Little Help needed moving from lying on your back to sitting on the side of a flat bed without using bedrails?: A Little Help needed moving to and from a bed to a chair (including a wheelchair)?: A Little Help needed standing up from a chair using your arms (e.g., wheelchair or bedside chair)?: A Little Help needed to walk in hospital room?: A Lot Help needed climbing 3-5 steps with a railing? :  Total 6 Click Score: 15    End of Session Equipment Utilized During Treatment: Gait belt Activity Tolerance: Patient limited by fatigue;Treatment limited secondary to medical complications (Comment) Patient left: in chair;with call bell/phone within reach;with chair alarm set;with family/visitor present Nurse Communication: Mobility status PT Visit Diagnosis: Unsteadiness on feet (R26.81);Difficulty in walking, not elsewhere classified (R26.2);Dizziness and giddiness (R42)    Time: 3968-8648 PT Time Calculation (min) (ACUTE ONLY): 20 min   Charges:   PT Evaluation $PT Eval Low Complexity: 1 Low          Tresa Endo PT Acute Rehabilitation Services Office [phone] Weekend [phone]   Claretha Cooper 12/01/2021, 1:39 PM

## 2021-12-02 ENCOUNTER — Encounter (HOSPITAL_COMMUNITY): Payer: Self-pay | Admitting: Internal Medicine

## 2021-12-02 ENCOUNTER — Other Ambulatory Visit: Payer: Self-pay

## 2021-12-02 DIAGNOSIS — K921 Melena: Secondary | ICD-10-CM | POA: Diagnosis not present

## 2021-12-02 LAB — COMPREHENSIVE METABOLIC PANEL
ALT: 57 U/L — ABNORMAL HIGH (ref 0–44)
AST: 48 U/L — ABNORMAL HIGH (ref 15–41)
Albumin: 2.7 g/dL — ABNORMAL LOW (ref 3.5–5.0)
Alkaline Phosphatase: 52 U/L (ref 38–126)
Anion gap: 4 — ABNORMAL LOW (ref 5–15)
BUN: 32 mg/dL — ABNORMAL HIGH (ref 8–23)
CO2: 22 mmol/L (ref 22–32)
Calcium: 8.4 mg/dL — ABNORMAL LOW (ref 8.9–10.3)
Chloride: 113 mmol/L — ABNORMAL HIGH (ref 98–111)
Creatinine, Ser: 1.63 mg/dL — ABNORMAL HIGH (ref 0.61–1.24)
GFR, Estimated: 43 mL/min — ABNORMAL LOW (ref >60)
Glucose, Bld: 132 mg/dL — ABNORMAL HIGH (ref 70–99)
Potassium: 3.8 mmol/L (ref 3.5–5.1)
Sodium: 139 mmol/L (ref 135–145)
Total Bilirubin: 0.8 mg/dL (ref 0.3–1.2)
Total Protein: 4.9 g/dL — ABNORMAL LOW (ref 6.5–8.1)

## 2021-12-02 LAB — GLUCOSE, CAPILLARY
Glucose-Capillary: 128 mg/dL — ABNORMAL HIGH (ref 70–99)
Glucose-Capillary: 140 mg/dL — ABNORMAL HIGH (ref 70–99)
Glucose-Capillary: 268 mg/dL — ABNORMAL HIGH (ref 70–99)
Glucose-Capillary: 316 mg/dL — ABNORMAL HIGH (ref 70–99)
Glucose-Capillary: 249 mg/dL — ABNORMAL HIGH (ref 70–99)
Glucose-Capillary: 266 mg/dL — ABNORMAL HIGH (ref 70–99)

## 2021-12-02 LAB — TYPE AND SCREEN
ABO/RH(D): O POS
Antibody Screen: NEGATIVE
Unit division: 0
Unit division: 0
Unit division: 0

## 2021-12-02 LAB — BPAM RBC
Blood Product Expiration Date: 202311202359
Blood Product Expiration Date: 202311202359
Blood Product Expiration Date: 202311202359
ISSUE DATE / TIME: 202310222227
ISSUE DATE / TIME: 202310230517
ISSUE DATE / TIME: 202310241207
Unit Type and Rh: 5100
Unit Type and Rh: 5100
Unit Type and Rh: 5100

## 2021-12-02 LAB — CBC WITH DIFFERENTIAL/PLATELET
Abs Immature Granulocytes: 0.02 10*3/uL (ref 0.00–0.07)
Basophils Absolute: 0 10*3/uL (ref 0.0–0.1)
Basophils Relative: 1 %
Eosinophils Absolute: 0.4 10*3/uL (ref 0.0–0.5)
Eosinophils Relative: 7 %
HCT: 24.9 % — ABNORMAL LOW (ref 39.0–52.0)
Hemoglobin: 7.7 g/dL — ABNORMAL LOW (ref 13.0–17.0)
Immature Granulocytes: 0 %
Lymphocytes Relative: 35 %
Lymphs Abs: 2.1 10*3/uL (ref 0.7–4.0)
MCH: 30.7 pg (ref 26.0–34.0)
MCHC: 30.9 g/dL (ref 30.0–36.0)
MCV: 99.2 fL (ref 80.0–100.0)
Monocytes Absolute: 0.6 10*3/uL (ref 0.1–1.0)
Monocytes Relative: 11 %
Neutro Abs: 2.9 10*3/uL (ref 1.7–7.7)
Neutrophils Relative %: 46 %
Platelets: 225 10*3/uL (ref 150–400)
RBC: 2.51 MIL/uL — ABNORMAL LOW (ref 4.22–5.81)
RDW: 17.9 % — ABNORMAL HIGH (ref 11.5–15.5)
WBC: 6.1 10*3/uL (ref 4.0–10.5)
nRBC: 0 % (ref 0.0–0.2)

## 2021-12-02 LAB — HEMOGLOBIN AND HEMATOCRIT, BLOOD
HCT: 23.9 % — ABNORMAL LOW (ref 39.0–52.0)
HCT: 24.6 % — ABNORMAL LOW (ref 39.0–52.0)
Hemoglobin: 7.7 g/dL — ABNORMAL LOW (ref 13.0–17.0)
Hemoglobin: 7.9 g/dL — ABNORMAL LOW (ref 13.0–17.0)

## 2021-12-02 LAB — MAGNESIUM: Magnesium: 2.1 mg/dL (ref 1.7–2.4)

## 2021-12-02 LAB — PHOSPHORUS: Phosphorus: 4 mg/dL (ref 2.5–4.6)

## 2021-12-02 MED ORDER — SUCRALFATE 1 G PO TABS
1.0000 g | ORAL_TABLET | Freq: Three times a day (TID) | ORAL | Status: DC
  Administered 2021-12-02 – 2021-12-03 (×3): 1 g via ORAL
  Filled 2021-12-02 (×3): qty 1

## 2021-12-02 MED ORDER — FERROUS SULFATE 325 (65 FE) MG PO TABS
325.0000 mg | ORAL_TABLET | Freq: Two times a day (BID) | ORAL | Status: DC
  Administered 2021-12-02 – 2021-12-03 (×2): 325 mg via ORAL
  Filled 2021-12-02 (×2): qty 1

## 2021-12-02 MED ORDER — INSULIN ASPART 100 UNIT/ML IJ SOLN
0.0000 [IU] | Freq: Three times a day (TID) | INTRAMUSCULAR | Status: DC
  Administered 2021-12-03: 3 [IU] via SUBCUTANEOUS

## 2021-12-02 MED ORDER — MELATONIN 3 MG PO TABS
3.0000 mg | ORAL_TABLET | Freq: Every evening | ORAL | Status: DC | PRN
  Administered 2021-12-02: 3 mg via ORAL
  Filled 2021-12-02: qty 1

## 2021-12-02 MED ORDER — ENSURE MAX PROTEIN PO LIQD
11.0000 [oz_av] | Freq: Every day | ORAL | Status: DC
  Administered 2021-12-02: 11 [oz_av] via ORAL
  Filled 2021-12-02 (×2): qty 330

## 2021-12-02 MED ORDER — DAPAGLIFLOZIN PROPANEDIOL 10 MG PO TABS
10.0000 mg | ORAL_TABLET | Freq: Every morning | ORAL | Status: DC
  Administered 2021-12-03: 10 mg via ORAL
  Filled 2021-12-02: qty 1

## 2021-12-02 MED ORDER — INSULIN ASPART 100 UNIT/ML IJ SOLN
0.0000 [IU] | Freq: Every day | INTRAMUSCULAR | Status: DC
  Administered 2021-12-02: 2 [IU] via SUBCUTANEOUS

## 2021-12-02 MED ORDER — INSULIN ASPART 100 UNIT/ML IJ SOLN
0.0000 [IU] | Freq: Three times a day (TID) | INTRAMUSCULAR | Status: DC
  Administered 2021-12-02: 5 [IU] via SUBCUTANEOUS
  Administered 2021-12-02: 7 [IU] via SUBCUTANEOUS

## 2021-12-02 NOTE — Plan of Care (Signed)
RD consulted for nutrition education regarding Nutrition Management GI bleed and DM.  Provided Fiber Restricted MNT handout from the nutrition care manual. Discussed with pt, wife and daughter that the fiber restricted diet is the opposite of what they've been told to manage DM. Recommend wife breads, cooked vegetables, canned fruits and tender proteins. Discouraged whole grains, nuts, seeds, beans, raw vegetables and raw fruits. Explained this is just for 1-2 weeks to allow time for the stomach and intestines to heal. Encouraged portion control in the this time and avoiding added sugar. Reviewed acceptable foods, and encouraged inclusion of protein in meals and snacks. Pt and wife asked about an ONS. Recommend Ensure Max to provide 150kcal, 30g protein, 6g CHO and 2g fiber. Recommend ONS with <2g fiber/serving. Provided coupons for use at home. Encouraged pt and wife to check with MD before advancing diet. Discussed adding back high fiber foods slowly. Pt and family asked appropriate questions and verbalized understanding. Wife is interested in outpatient follow up for DM education when a regular diet may resume.  Pt verbalizes understanding of information provided. Expect good compliance.  Body mass index is 24.27 Pt meets criteria for healthy weight based on current BMI.  Current diet order is soft, patient is consuming approximately >75% of meals at this time. Labs and medications reviewed. No further nutrition interventions warranted at this time. RD contact information provided. If additional nutrition issues arise, please re-consult Rd.  Candise Bowens, MS, RD, LDN, CNSC See AMiON for contact information

## 2021-12-02 NOTE — Progress Notes (Signed)
[facility] Progress Note  Patient: Daryl Webb     BPZ:[id number]  DOA: 11/29/2021   PCP: [name], [name], MD       Brief hospital course: This is a 76 -year-old male with diabetes mellitus, chronic kidney disease who is status post TAVR who had a GI bleed recently and underwent an EGD on 11/21/2021 which revealed a cratered duodenal ulcer. He has been taking Protonix at home since that diagnosis. He presents to the hospital for feeling weak lightheaded and having melena Hemoglobin was noted to be 7.1 and subsequently 6.6.  Repeat EGD performed on 10/23 f Subjective:  Patient states he has not tried any solid food as of yet but is tolerating full liquids. Assessment and Plan: Principal Problem:   GI bleeding-upper - 10/23-EGD>fresh and clotted blood in the gastric body, erosions, cratered duodenal ulcers with visible vessels 1 of which was bleeding and is status post clipping. -GI recommended to continue IV Protonix infusion for 72 hours-plan for twice daily for 8 more weeks. -Carafate also started today 1 g between meals and at bedtime x1 month - Has been advanced to solid food today-we will continue to follow today -Follow-up with GI has been made for 11/27  Active Problems: Acute blood loss anemia-due to above   Iron deficiency anemia due to chronic blood loss - Status post 3 units of packed red blood cells -Hemoglobin has been ranging from 7.5-8 -Continue oral iron -Plan for lab work next week with GI    CKD (chronic kidney disease), stage IIIb -Follow creatinine as needed    Type 2 diabetes mellitus with renal manifestations ([place]) -Home Farxiga and metformin on hold-continue insulin while in hospital -The patient and his wife have requested a dietitian consult for more education which I have ordered Hemoglobin A1C    Component Value Date/Time   HGBA1C 7.2 (H) 11/23/2021 0943   HGBA1C 7.6 01/29/2021 0000       Code Status: Full Code DVT  prophylaxis:  SCDs Start: 11/29/21 1907    Consultants: GI Level of Care: Level of care: Stepdown  Objective:   Vitals:   12/02/21 0713 12/02/21 0756 12/02/21 0900 12/02/21 1000  BP:   (!) 114/48 (!) 134/59  Pulse: 68 79 71 77  Resp: 18 (!) '27 19 20  '$ Temp:      TempSrc:      SpO2: 97% 98% 99% 95%  Weight:      Height:       Filed Weights   11/29/21 1018  Weight: 70.3 kg   Exam: General exam: Appears comfortable  HEENT: oral mucosa moist Respiratory system: Clear to auscultation.  Cardiovascular system: S1 & S2 heard  Gastrointestinal system: Abdomen soft, non-tender, nondistended. Normal bowel sounds   Extremities: No cyanosis, clubbing or edema Psychiatry:  Mood & affect appropriate.    Imaging and lab data was personally reviewed    CBC: Recent Labs  Lab 11/29/21 1028 11/29/21 1958 11/30/21 0240 11/30/21 0940 12/01/21 0311 12/01/21 1021 12/01/21 1633 12/02/21 0254 12/02/21 0519 12/02/21 0905  WBC 6.7 8.3 8.0  --  7.5  --   --   --  6.1  --   NEUTROABS  --   --   --   --  3.5  --   --   --  2.9  --   HGB 7.1* 6.6* 7.1*   < > 7.5* 7.3* 7.9* 7.7* 7.7* 7.9*  HCT 22.4* 21.4* 22.2*   < > 23.5* 23.3* 24.6*  23.9* 24.9* 24.6*  MCV 96.6 100.9* 96.1  --  97.1  --   --   --  99.2  --   PLT 303 293 256  --  266  --   --   --  225  --    < > = values in this interval not displayed.   Basic Metabolic Panel: Recent Labs  Lab 11/29/21 1028 11/30/21 0240 12/01/21 0311 12/02/21 0519  NA 134* 140 141 139  K 4.2 4.3 3.6 3.8  CL 103 112* 114* 113*  CO2 18* 21* 21* 22  GLUCOSE 368* 173* 155* 132*  BUN 90* 71* 52* 32*  CREATININE 1.80* 1.77* 1.57* 1.63*  CALCIUM 9.2 8.6* 8.7* 8.4*  MG  --   --  2.1 2.1  PHOS  --   --  3.4 4.0   GFR: Estimated Creatinine Clearance: 36 mL/min (A) (by C-G formula based on SCr of 1.63 mg/dL (H)).  Scheduled Meds:  sodium chloride   Intravenous Once   sodium chloride   Intravenous Once   atorvastatin  40 mg Oral Daily    Chlorhexidine Gluconate Cloth  6 each Topical Daily   ferrous sulfate  325 mg Oral BID WC   insulin aspart  0-9 Units Subcutaneous TID WC   insulin glargine-yfgn  10 Units Subcutaneous QHS   [COMPLETED] melatonin  3 mg Oral Once   [START ON 12/03/2021] pantoprazole  40 mg Intravenous Q12H   sodium chloride flush  3 mL Intravenous Q12H   sucralfate  1 g Oral TID BM   Continuous Infusions:  pantoprazole 8 mg/hr (12/02/21 0700)     LOS: 3 days   Author: Debbe Odea  12/02/2021 12:25 PM

## 2021-12-02 NOTE — Progress Notes (Signed)
Patient ID: Daryl Webb, male   DOB: Sep 08, 1945, 76 y.o.   MRN: [id number]    Progress Note   Subjective   Day # 3  CC; melena, anemia, shortness of breath, admission earlier this month with acute GI bleed  PPI infusion  EGD 11/30/2021-gastric erosions, 3 cratered duodenal ulcers 2 with visible vessels-1 actively bleeding largest was treated with gold probe within Endo Clip, and 2 endoclips applied to the second largest ulcer in the duodenal sweep  HGB 7.3 yesterday was given 1 more unit-7.7 >7.7  Patient up in chair for the first time, wife says he is weak and has not ambulated as yet, he says generally he is feeling better, is hungry, no complaints of abdominal discomfort and bowel movement this morning per wife more brown in color   Objective   Vital signs in last 24 hours: Temp:  [97.5 F (36.4 C)-98.3 F (36.8 C)] 98.1 F (36.7 C) (10/25 0707) Pulse Rate:  [68-104] 71 (10/25 0900) Resp:  [18-28] 19 (10/25 0900) BP: (114-170)/(45-139) 114/48 (10/25 0900) SpO2:  [97 %-100 %] 99 % (10/25 0900) Last BM Date : 12/01/21 General: Elderly white male in NAD, sitting up in chair Heart:  Regular rate and rhythm; no murmurs Lungs: Respirations even and unlabored, lungs CTA bilaterally Abdomen:  Soft, nontender and nondistended. Normal bowel sounds. Extremities:  Without edema. Neurologic:  Alert and oriented,  grossly normal neurologically. Psych:  Cooperative. Normal mood and affect.  Intake/Output from previous day: 10/24 0701 - 10/25 0700 In: 723.4 [I.V.:250.1; Blood:473.3] Out: -  Intake/Output this shift: Total I/O In: 3 [I.V.:3] Out: -   Lab Results: Recent Labs    11/30/21 0240 11/30/21 0940 12/01/21 0311 12/01/21 1021 12/01/21 1633 12/02/21 0254 12/02/21 0519  WBC 8.0  --  7.5  --   --   --  6.1  HGB 7.1*   < > 7.5*   < > 7.9* 7.7* 7.7*  HCT 22.2*   < > 23.5*   < > 24.6* 23.9* 24.9*  PLT 256  --  266  --   --   --  225   < > = values in this  interval not displayed.   BMET Recent Labs    11/30/21 0240 12/01/21 0311 12/02/21 0519  NA 140 141 139  K 4.3 3.6 3.8  CL 112* 114* 113*  CO2 21* 21* 22  GLUCOSE 173* 155* 132*  BUN 71* 52* 32*  CREATININE 1.77* 1.57* 1.63*  CALCIUM 8.6* 8.7* 8.4*   LFT Recent Labs    12/02/21 0519  PROT 4.9*  ALBUMIN 2.7*  AST 48*  ALT 57*  ALKPHOS 52  BILITOT 0.8   PT/INR No results for input(s): "LABPROT", "INR" in the last 72 hours.      Assessment / Plan:    #46 76 year old white male with recurrent acute GI bleed after very recent admission with acute bleed 11/21/2021.  At initial endoscopy was found to have a duodenal ulcer with no stigmata of active bleeding at that time.  Biopsies negative for H. Pylori  Pete EGD 11/30/2021 with finding of a few gastric erosions, 3 cratered duodenal ulcers 2 with visible vessels, 1 actively bleeding in the duodenal bulb treated with gold probe then Endo Clip and endoclips placed to 1 other duodenal ulcer.   has been stable over the past 24 hours, did receive 1 unit of packed RBCs yesterday hemoglobin 7.7 this morning  Stool today more brown appearing  # 2 anemia  acute and subacute secondary to GI bleeding #3 iron deficiency, received Ferrlecit x2 about 2 weeks ago during admission, will start oral iron therapy here #4 history of B12 deficiency-B12 injection given earlier this week # 5 acute kidney injury on chronic kidney disease stage III-stable #6 history of prostate cancer #7.  History of adult onset diabetes mellitus #8.  Aortic stenosis status post TAVR  Plan; PPI infusion and switch to Protonix 40 mg p.o. twice daily with plan for a 12-week course, then probably long-term once daily PPI will start Carafate 1 g between meals and at bedtime for 1 month Advance to soft diet Start ferrous sulfate today No aspirin for at least 2 weeks He has appointment scheduled at our office on 01/04/2022 at 2 PM with Carl Best,  NP- And have asked them to come to our lab next week on 10/31 or 12/09/2021 for follow-up CBC  GI will sign off, available for questions problems.     Principal Problem:   GI bleeding Active Problems:   History of prostate cancer   Type 2 diabetes mellitus with renal manifestations (HCC)   Iron deficiency anemia due to chronic blood loss   Dyslipidemia (high LDL; low HDL)   PUD (peptic ulcer disease)   CKD (chronic kidney disease), stage III (HCC)   S/P TAVR (transcatheter aortic valve replacement)   Symptomatic anemia     LOS: 3 days   Shamarra Warda PA-C 12/02/2021, 9:14 AM

## 2021-12-03 DIAGNOSIS — K921 Melena: Secondary | ICD-10-CM | POA: Diagnosis not present

## 2021-12-03 LAB — CBC
HCT: 23.8 % — ABNORMAL LOW (ref 39.0–52.0)
Hemoglobin: 7.6 g/dL — ABNORMAL LOW (ref 13.0–17.0)
MCH: 30.9 pg (ref 26.0–34.0)
MCHC: 31.9 g/dL (ref 30.0–36.0)
MCV: 96.7 fL (ref 80.0–100.0)
Platelets: 230 10*3/uL (ref 150–400)
RBC: 2.46 MIL/uL — ABNORMAL LOW (ref 4.22–5.81)
RDW: 17.5 % — ABNORMAL HIGH (ref 11.5–15.5)
WBC: 7.2 10*3/uL (ref 4.0–10.5)
nRBC: 0 % (ref 0.0–0.2)

## 2021-12-03 MED ORDER — SUCRALFATE 1 G PO TABS: 1.0000 g | ORAL_TABLET | Freq: Three times a day (TID) | ORAL | 0 refills | Status: DC

## 2021-12-03 MED ORDER — PANTOPRAZOLE SODIUM 40 MG PO TBEC: 40.0000 mg | DELAYED_RELEASE_TABLET | Freq: Two times a day (BID) | ORAL | 2 refills | Status: DC

## 2021-12-03 NOTE — Discharge Summary (Signed)
Physician Discharge Summary  Daryl Webb NMM:[id number] DOB: 28-Mar-1945 DOA: 11/29/2021  PCP: [name], [name], MD  Admit date: 11/29/2021 Discharge date: 12/03/2021 Discharging to: home Recommendations for Outpatient Follow-up:  F/u Iron panel and continue to replace Iron as needed  Consults:  GI Procedures:  EGD   Discharge Diagnoses:   Principal Problem:   GI bleeding Active Problems:   Symptomatic anemia   History of prostate cancer   Type 2 diabetes mellitus with renal manifestations ([place])   Dyslipidemia (high LDL; low HDL)   PUD (peptic ulcer disease)   CKD (chronic kidney disease), stage III (HCC)   S/P TAVR (transcatheter aortic valve replacement)     Hospital Course:  This is a 76 -year-old male with diabetes mellitus, chronic kidney disease who is status post TAVR who had a GI bleed recently and underwent an EGD on 11/21/2021 which revealed a cratered duodenal ulcer. He has been taking Protonix at home since that diagnosis. He presents to the hospital for feeling weak lightheaded and having melena Hemoglobin was noted to be 7.1 and subsequently 6.6.   Repeat EGD performed on 10/23.  Principal Problem:   GI bleeding-upper - 10/23-EGD>fresh and clotted blood in the gastric body, erosions, cratered duodenal ulcers with visible vessels 1 of which was bleeding and was clipped -GI recommended to continue IV Protonix infusion for 72 hours which has been completed - plan for Protonix twice daily for 8 more weeks. -Carafate also started on 10/25 1 g between meals and at bedtime x1 month - Has been advanced to solid food  which he is tolerating - hold aspirin and NSAIDs for 2 wks- discussed with patient and wife -Follow-up with GI has been made for 11/27   Active Problems: Acute blood loss anemia-due to above   Iron deficiency anemia due to chronic blood loss - Status post 3 units of packed red blood cells -Hemoglobin has been ranging from 7.5-8  Latest  Reference Range & Units 11/21/21 08:38  Iron 45 - 182 ug/dL 22 (L)  UIBC ug/dL 305  TIBC 250 - 450 ug/dL 327  Saturation Ratios 17.9 - 39.5 % 7 (L)  Ferritin 24 - 336 ng/mL 13 (L)  -Continue oral iron -Plan for lab work next week with GI     CKD (chronic kidney disease), stage IIIb - Cr 1.5-1.7     Type 2 diabetes mellitus with renal manifestations ([place]) - continue Farxiga and metformin  -The patient and his wife have requested a dietitian consult for more education which I ordered for them Hemoglobin A1C Labs (Brief)          Component Value Date/Time    HGBA1C 7.2 (H) 11/23/2021 0943                Discharge Instructions  Discharge Instructions     Amb Referral to Nutrition and Diabetic Education   Complete by: As directed    Diet - low sodium heart healthy   Complete by: As directed    Diet Carb Modified   Complete by: As directed    Increase activity slowly   Complete by: As directed       Allergies as of 12/03/2021   No Known Allergies      Medication List     STOP taking these medications    aspirin 81 MG tablet       TAKE these medications    amLODipine 5 MG tablet Commonly known as: NORVASC Take 1 tablet (5 mg  total) by mouth daily. What changed: when to take this   amoxicillin 500 MG capsule Commonly known as: AMOXIL Take 4 capsules (2,000 mg total) by mouth See admin instructions. Take 2000 mg 1 hour prior to dental procedures What changed: additional instructions   atorvastatin 40 MG tablet Commonly known as: LIPITOR Take 1 tablet (40 mg total) by mouth daily. What changed: when to take this   calcium carbonate 500 MG chewable tablet Commonly known as: TUMS - dosed in mg elemental calcium Chew 2 tablets by mouth daily as needed for indigestion or heartburn.   CoQ10 100 MG Caps Take 100 mg by mouth daily.   cyanocobalamin 1000 MCG/ML injection Commonly known as: VITAMIN B12 Inject 1,000 mcg into the muscle every 30  (thirty) days.   Farxiga 10 MG Tabs tablet Generic drug: dapagliflozin propanediol Take 10 mg by mouth in the morning.   ferrous gluconate 324 MG tablet Commonly known as: FERGON Take 1 tablet (324 mg total) by mouth daily with breakfast.   losartan-hydrochlorothiazide 50-12.5 MG tablet Commonly known as: HYZAAR TAKE 1 TABLET DAILY   metFORMIN 500 MG tablet Commonly known as: GLUCOPHAGE Take 500 mg by mouth 2 (two) times daily with a meal.   metoprolol succinate 50 MG 24 hr tablet Commonly known as: TOPROL-XL TAKE 1 TABLET DAILY   NON FORMULARY Take 2 capsules by mouth See admin instructions. Vicks ZzzQuil Pure Zzzs Triple Action Gummies- Chew 2 gummies by mouth every night at bedtime   nystatin ointment Commonly known as: MYCOSTATIN Apply 1 application topically 3 (three) times daily. What changed:  when to take this reasons to take this   pantoprazole 40 MG tablet Commonly known as: PROTONIX Take 1 tablet (40 mg total) by mouth 2 (two) times daily before a meal. What changed: additional instructions   PREVAGEN PO Take 1 capsule by mouth daily.   sucralfate 1 g tablet Commonly known as: CARAFATE Take 1 tablet (1 g total) by mouth 3 (three) times daily between meals.   TYLENOL 500 MG tablet Generic drug: acetaminophen Take 1,000 mg by mouth every 6 (six) hours as needed for headache.        Follow-up Information     [name], [name], MD Follow up.   Specialty: Family Medicine Why: 2-4 WKS Contact information: [place] [address] [phone]                    The results of significant diagnostics from this hospitalization (including imaging, microbiology, ancillary and laboratory) are listed below for reference.    CT Head Wo Contrast  Result Date: 11/20/2021 CLINICAL DATA:  Memory loss EXAM: CT HEAD WITHOUT CONTRAST TECHNIQUE: Contiguous axial images were obtained from the base of the skull through the vertex  without intravenous contrast. RADIATION DOSE REDUCTION: This exam was performed according to the departmental dose-optimization program which includes automated exposure control, adjustment of the mA and/or kV according to patient size and/or use of iterative reconstruction technique. COMPARISON:  None Available. FINDINGS: Brain: No evidence of acute infarction, hemorrhage, hydrocephalus, extra-axial collection or mass lesion/mass effect. Mild atrophic changes are noted. Vascular: No hyperdense vessel or unexpected calcification. Skull: Normal. Negative for fracture or focal lesion. Sinuses/Orbits: Opacification of the left maxillary antrum is noted of uncertain chronicity. Mucosal thickening is noted in the left frontal sinus as well. Other: None. IMPRESSION: Mild atrophic changes without acute intracranial abnormality. Left-sided sinus disease as described. Electronically Signed   By: Elta Guadeloupe  Lukens M.D.   On: 11/20/2021 21:55   DG Chest 2 View  Result Date: 11/20/2021 CLINICAL DATA:  Shortness of breath. EXAM: CHEST - 2 VIEW COMPARISON:  August 08, 2020 FINDINGS: The heart size and mediastinal contours are within normal limits. There is moderate severity calcification of the aortic arch. An artificial aortic valve is also noted. Both lungs are clear. Multilevel degenerative changes are seen throughout the thoracic spine. Postoperative changes are noted within the visualized portion of the mid lumbar spine. IMPRESSION: No active cardiopulmonary disease. Electronically Signed   By: Virgina Norfolk M.D.   On: 11/20/2021 21:20   Labs:   Basic Metabolic Panel: Recent Labs  Lab 11/29/21 1028 11/30/21 0240 12/01/21 0311 12/02/21 0519  NA 134* 140 141 139  K 4.2 4.3 3.6 3.8  CL 103 112* 114* 113*  CO2 18* 21* 21* 22  GLUCOSE 368* 173* 155* 132*  BUN 90* 71* 52* 32*  CREATININE 1.80* 1.77* 1.57* 1.63*  CALCIUM 9.2 8.6* 8.7* 8.4*  MG  --   --  2.1 2.1  PHOS  --   --  3.4 4.0     CBC: Recent Labs   Lab 11/29/21 1958 11/30/21 0240 11/30/21 0940 12/01/21 0311 12/01/21 1021 12/01/21 1633 12/02/21 0254 12/02/21 0519 12/02/21 0905 12/03/21 0302  WBC 8.3 8.0  --  7.5  --   --   --  6.1  --  7.2  NEUTROABS  --   --   --  3.5  --   --   --  2.9  --   --   HGB 6.6* 7.1*   < > 7.5*   < > 7.9* 7.7* 7.7* 7.9* 7.6*  HCT 21.4* 22.2*   < > 23.5*   < > 24.6* 23.9* 24.9* 24.6* 23.8*  MCV 100.9* 96.1  --  97.1  --   --   --  99.2  --  96.7  PLT 293 256  --  266  --   --   --  225  --  230   < > = values in this interval not displayed.         SIGNED:   Debbe Odea, MD  [facility] 12/03/2021, 1:17 PM

## 2021-12-03 NOTE — Discharge Instructions (Signed)
PLEASE AVOID ASPIRIN, IBUPROFEN, NAPROXEN FOR 2 WKS.    Please review all of you discharge paperwork on the day of discharge and be sure you have all of your prescribed medications.  Please request your Primary MD to go over all Hospital Tests and Procedure/Radiological results at the follow up Please get all Hospital records sent to your primary MD by signing hospital release before you go home.   In some cases, there will be blood work, cultures and biopsy results pending at the time of your discharge. Please request that your primary care M.D. goes through all the records of your hospital data and follows up on these results.  Please take all your medications with you for your next visit with your Primary MD   Please request your Primary MD to go over all hospital tests and procedure/radiological results at the follow up, please ask your Primary MD to get all Hospital records sent to his/her office.   You must read complete instructions/literature along with all the possible adverse reactions/side effects for all the Medicines you take and that have been prescribed to you. Take any new Medicines after you have completely understood and accpet all the possible adverse reactions/side effects.    Do not drive or operate heavy machinery when taking Pain medications.    Do not take more than prescribed Pain, Sleep and Anxiety Medications  If you have smoked or chewed Tobacco  in the last 2 yrs please stop smoking, stop any regular Alcohol  and or any Recreational drug use.   Wear Seat belts while driving.   If you had Pneumonia or Lung problems at the Hospital: Please get a 2 view Chest X ray done in 6-8 weeks after hospital discharge or sooner if instructed by your Primary MD.   If you have Congestive Heart Failure: Please call your Cardiologist or Primary MD anytime you have any of the following symptoms:  1) 3 pound weight gain in 24 hours or 5 pounds in 1 week  2) shortness of breath,  with or without a dry hacking cough  3) swelling in the hands, feet or stomach  4) if you have to sleep on extra pillows at night in order to breathe 5) Follow cardiac low salt diet and 1.5 lit/day fluid restriction.   If you have Diabetes Accuchecks 4 times/day- once on AM empty stomach and then before each meal. Log in all results and show them to your primary doctor at your next visit. If any glucose reading is under 60 or above 400 call your primary MD immediately.   If you have Seizure/Convulsions/Epilepsy: Please do not drive, operate heavy machinery, participate in activities at heights or participate in high speed sports until you have seen by Primary MD or a Neurologist and advised to do so again. Per [facility] statutes, patients with seizures are not allowed to drive until they have been seizure-free for six months.  Use caution when using heavy equipment or power tools. Avoid working on ladders or at heights. Take showers instead of baths. Ensure the water temperature is not too high on the home water heater. Do not go swimming alone. Do not lock yourself in a room alone (i.e. bathroom). When caring for infants or small children, sit down when holding, feeding, or changing them to minimize risk of injury to the child in the event you have a seizure. Maintain good sleep hygiene. Avoid alcohol.    If you had Gastrointestinal Bleeding: Please ask your  Primary MD to check a complete blood count within one week of discharge or at your next visit. Your endoscopic/colonoscopic biopsies that are pending at the time of discharge, will also need to followed by your Primary MD.  Please note You were cared for by a hospitalist during your hospital stay. If you have any questions about your discharge medications or the care you received while you were in the hospital after you are discharged, you can call the unit and asked to speak with the hospitalist on call if the hospitalist that took  care of you is not available. Once you are discharged, your primary care physician will handle any further medical issues. Please note that NO REFILLS for any discharge medications will be authorized once you are discharged, as it is imperative that you return to your primary care physician (or establish a relationship with a primary care physician if you do not have one) for your aftercare needs so that they can reassess your need for medications and monitor your lab values.   You can reach the hospitalist office at phone [phone] or fax [phone]   If you do not have a primary care physician, you can call [phone] for a physician referral.

## 2021-12-03 NOTE — Consult Note (Signed)
   [facility] Inpatient Consult   12/03/2021  Joselito Fieldhouse Aprea 24-Mar-1945 [id number]  [facility] [ACO] Patient: Medicare [facility] Liaison remote coverage review for [facility] for   Primary Care Provider:  [name], [name], MD, [place] [facility] is listed to provide the post hospital transition of care follow up   Patient screened for less than 7 days readmission hospitalization with noted medium risk score for unplanned readmission risk and to assess for potential [place] for post hospital transition for readmission prevention needs. .  Review of patient's medical record reveals patient is transitioning home.   Plan:  No care coordination needs assessed patient to have a TOC follow up call as listed from PCP office/team.  For questions contact:   Natividad Brood, RN BSN [place]  [phone] business mobile phone Toll free office [phone]  *[place]  [phone] Fax number: [phone] [email or web address]

## 2021-12-04 LAB — GLUCOSE, CAPILLARY: Glucose-Capillary: 191 mg/dL — ABNORMAL HIGH (ref 70–99)

## 2021-12-04 NOTE — Telephone Encounter (Signed)
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 I see where he signed off on Paula's note in the hospital.

## 2021-12-04 NOTE — Telephone Encounter (Signed)
Yes, should get CBC mid next week Not sure who is primary [facility] MD is, but lab should be ordered under his provider Thanks JMP

## 2021-12-04 NOTE — Telephone Encounter (Signed)
Patients wife called would like to know if Dr. Hilarie Fredrickson would need for him to get blood work done to check his hemoglobin levels because he was just released from the hospital and it was really low since his office visit appt is about a month away.

## 2021-12-04 NOTE — Telephone Encounter (Signed)
Thanks Vaughan Basta looks like Amy Esterwood already entered the lab order I will call and make sure the pt is aware.

## 2021-12-07 ENCOUNTER — Telehealth: Payer: Self-pay

## 2021-12-07 ENCOUNTER — Telehealth: Payer: Self-pay | Admitting: Internal Medicine

## 2021-12-07 NOTE — Telephone Encounter (Signed)
Inbound call from patient wife stating husband has not had a bowel movement since last Wednesday, requesting a call back to further advise.

## 2021-12-07 NOTE — Telephone Encounter (Signed)
Transition Care Management Unsuccessful Follow-up Telephone Call  Date of discharge and from where:  [facility] 12-03-21 Dx: GI Bleed  Attempts:  1st Attempt  Reason for unsuccessful TCM follow-up call:  Left voice message   Juanda Crumble LPN [place] Direct Dial [phone]   Transition Care Management Follow-up Telephone Call Date of discharge and from where: [facility] 12-03-21 Dx: GI Bleed How have you been since you were released from the hospital? Feeling better  Any questions or concerns? Yes- wife is concerned with memory issues   Items Reviewed: Did the pt receive and understand the discharge instructions provided? Yes  Medications obtained and verified? Yes  Other? No  Any new allergies since your discharge? No  Dietary orders reviewed? Yes Do you have support at home? Yes   Home Care and Equipment/Supplies: Were home health services ordered? no If so, what is the name of the agency? na  Has the agency set up a time to come to the patient's home? not applicable Were any new equipment or medical supplies ordered?  No What is the name of the medical supply agency? na Were you able to get the supplies/equipment? not applicable Do you have any questions related to the use of the equipment or supplies? No  Functional Questionnaire: (I = Independent and D = Dependent) ADLs: I  Bathing/Dressing- I  Meal Prep- I  Eating- I  Maintaining continence- I  Transferring/Ambulation- I  Managing Meds- D  Follow up appointments reviewed:  PCP Hospital f/u appt confirmed? Yes  Scheduled to see Dr [name] on 12-09-21 @ [facility] f/u appt confirmed? Yes  Scheduled to see Dr Berniece Pap on 01-04-22 @ 2pm. Are transportation arrangements needed? No  If their condition worsens, is the pt aware to call PCP or go to the Emergency Dept.? Yes Was the patient provided with contact information for the PCP's office or ED? Yes Was  to pt encouraged to call back with questions or concerns? Yes   Juanda Crumble LPN [place] Direct Dial [phone]

## 2021-12-07 NOTE — Telephone Encounter (Signed)
Left message to call back.  Spoke with pts wife and let her know that he can do a miralax purge and mix 7 doses of miralax with a 32oz bottle of gatorade zero (diabetic) and drink 1 cup every 15 min until gone. Wife verbalized understanding.

## 2021-12-08 DIAGNOSIS — N1832 Chronic kidney disease, stage 3b: Secondary | ICD-10-CM | POA: Diagnosis not present

## 2021-12-08 DIAGNOSIS — E1122 Type 2 diabetes mellitus with diabetic chronic kidney disease: Secondary | ICD-10-CM | POA: Diagnosis not present

## 2021-12-08 DIAGNOSIS — E1165 Type 2 diabetes mellitus with hyperglycemia: Secondary | ICD-10-CM | POA: Diagnosis not present

## 2021-12-08 NOTE — Progress Notes (Signed)
HPI: Daryl Webb is a 76 y.o. male, who is here today with his wife to follow on recent hospitalization. He was admitted on 10/22 and discharged home on 12/03/21. Initially admitted from 10/13-10/16/23, he presented to the ED c/p lightheadedness, SOB,and melena. Iron infusion. Hemoglobin on arrival 7.8 and dropped to 6.1. GI was consulted.He received 2 units of packed red blood cells. He was continued on pantoprazole infusion.   He underwent EGD, he was found to have Non-bleeding duodenal ulcer with pigmented material.  Presented to the ED again on 11/29/21 because SOB, recurrent melena,nausea,and vomiting. Hemoglobin was noted to be 7.1 and subsequently 6.6.   TCM call 12/07/21. EGD on 11/30/21: Fresh and clotted blood in the gastric body, erosions, cratered duodenal ulcers with active bleeding, vessel was clipped   He is on Protonix 40 mg bid and sucralfate. He has stopped Diclofenac.  He denies any blood in stool or melena, but reports constipation.He was instructed to follow low fiber diet and started on Fe Sulfate 325 mg daily. Negative for abdominal pain or nausea. He does report shortness of breath with exertion. Lab Results  Component Value Date   WBC 7.2 12/03/2021   HGB 8.0 (A) 12/09/2021   HCT 23.8 (L) 12/03/2021   MCV 96.7 12/03/2021   PLT 230 12/03/2021   HTN and CKD III: He is on Losartan-HCTZ 50-12.5 mg daily, Amlodipine 5 mg daily,and Metoprolol succinate 50 mg daily. Denies severe/frequent headache, visual changes, chest pain, palpitation, focal weakness, or edema.  DM II:  HgA1C 7.2 on 11/23/21. Yesterday he saw his endocrinologist. His wife is concerned about elevated BS's, 200-300's. Treatment has been adjusted. He is on Metformin, Farxiga,and Bydureon 2 mg weekly.  Lab Results  Component Value Date   CREATININE 1.63 (H) 12/02/2021   BUN 32 (H) 12/02/2021   NA 139 12/02/2021   K 3.8 12/02/2021   CL 113 (H) 12/02/2021   CO2 22 12/02/2021    He reports feeling better but still experiencing fatigue.  He has an appt with his GI today, planning on having CBC in his office. Lab Results  Component Value Date   ALT 57 (H) 12/02/2021   AST 48 (H) 12/02/2021   ALKPHOS 52 12/02/2021   BILITOT 0.8 12/02/2021   His wife reports concerns about his memory, which has been declining over the past year. There is no known family history of dementia. Difficulty retaining new information, short term memory. B12 deficiency on B12 supplementation. Lab Results  Component Value Date   VITAMINB12 246 11/21/2021   She is also reports long-standing hearing loss, which has not been evaluated in over 10 years.   Review of Systems  Constitutional:  Positive for activity change and fatigue. Negative for appetite change and fever.  HENT:  Negative for nosebleeds, sore throat and trouble swallowing.   Eyes:  Negative for redness and visual disturbance.  Respiratory:  Negative for cough and wheezing.   Gastrointestinal:  Negative for abdominal distention and vomiting.  Endocrine: Negative for cold intolerance and heat intolerance.  Genitourinary:  Negative for decreased urine volume, dysuria and hematuria.  Musculoskeletal:  Positive for arthralgias and back pain.  Skin:  Negative for rash.  Neurological:  Negative for dizziness, syncope, facial asymmetry and weakness.  Psychiatric/Behavioral:  Negative for behavioral problems and hallucinations.   Rest see pertinent positives and negatives per HPI.  Current Outpatient Medications on File Prior to Visit  Medication Sig Dispense Refill   amLODipine (NORVASC) 5 MG tablet Take  1 tablet (5 mg total) by mouth daily. (Patient taking differently: Take 5 mg by mouth at bedtime.) 90 tablet 2   amoxicillin (AMOXIL) 500 MG capsule Take 4 capsules (2,000 mg total) by mouth See admin instructions. Take 2000 mg 1 hour prior to dental procedures 12 capsule 2   Apoaequorin (PREVAGEN PO) Take 1 capsule by mouth  daily.     atorvastatin (LIPITOR) 40 MG tablet Take 1 tablet (40 mg total) by mouth daily. (Patient taking differently: Take 40 mg by mouth at bedtime.) 90 tablet 3   calcium carbonate (TUMS - DOSED IN MG ELEMENTAL CALCIUM) 500 MG chewable tablet Chew 2 tablets by mouth daily as needed for indigestion or heartburn.     Cholecalciferol (VITAMIN D3) 1000 units CAPS Take 2,000 mg by mouth daily.     Coenzyme Q10 (COQ10) 100 MG CAPS Take 100 mg by mouth daily.     cyanocobalamin (VITAMIN B12) 1000 MCG/ML injection Inject 1,000 mcg into the muscle every 30 (thirty) days.     Exenatide ER (BYDUREON BCISE) 2 MG/0.85ML AUIJ Inject 2 mg into the skin once a week.     FARXIGA 10 MG TABS tablet Take 10 mg by mouth in the morning.     ferrous gluconate (FERGON) 324 MG tablet Take 1 tablet (324 mg total) by mouth daily with breakfast. 30 tablet 3   losartan-hydrochlorothiazide (HYZAAR) 50-12.5 MG tablet TAKE 1 TABLET DAILY (Patient taking differently: Take 1 tablet by mouth daily.) 90 tablet 1   metFORMIN (GLUCOPHAGE) 500 MG tablet Take 500 mg by mouth 2 (two) times daily with a meal.     metoprolol succinate (TOPROL-XL) 50 MG 24 hr tablet TAKE 1 TABLET DAILY (Patient taking differently: Take 50 mg by mouth daily.) 90 tablet 2   NON FORMULARY Take 2 capsules by mouth See admin instructions. Vicks ZzzQuil Pure Zzzs Triple Action Gummies- Chew 2 gummies by mouth every night at bedtime     nystatin ointment (MYCOSTATIN) Apply 1 application topically 3 (three) times daily. 30 g 0   pantoprazole (PROTONIX) 40 MG tablet Take 1 tablet (40 mg total) by mouth 2 (two) times daily before a meal. 60 tablet 2   sucralfate (CARAFATE) 1 g tablet Take 1 tablet (1 g total) by mouth 3 (three) times daily between meals. 90 tablet 0   TYLENOL 500 MG tablet Take 1,000 mg by mouth every 6 (six) hours as needed for headache.     No current facility-administered medications on file prior to visit.   Past Medical History:  Diagnosis  Date   Acute blood loss anemia 06/27/2012   Anemia    Arthritis    CKD (chronic kidney disease)    Constipation due to pain medication    needs stool softner while on pain medication   Diabetes mellitus    Duodenal ulcer 08/13/2015   2014  - Dr Kaplan/GI   Duodenal ulcer with hemorrhage 08/13/2015   2014  - Dr Kaplan/GI   Dyspnea 06/19/2012   GERD (gastroesophageal reflux disease)    H/O: osteoarthritis    History of kidney stones    History of prostate cancer    History of urethral stricture    Hypercholesterolemia    Hypertension    Melena 06/19/2012   Prostate cancer ([place])    prostate   S/P TAVR (transcatheter aortic valve replacement) 08/12/2020   s/p TAVR with a 29 mm Edwards Sapien 3 THV via the TF approach by [name] and [name]  Severe aortic stenosis    Spinal stenosis    Ulcer duodenal hemorrhage    No Known Allergies  Social History   Socioeconomic History   Marital status: Married    Spouse name: Not on file   Number of children: 2   Years of education: 16   Highest education level: Bachelor's degree (e.g., BA, AB, BS)  Occupational History   Occupation: Retired  Tobacco Use   Smoking status: Former    Packs/day: 1.50    Years: 20.00    Total pack years: 30.00    Types: Cigarettes    Quit date: 02/09/1980    Years since quitting: 41.8   Smokeless tobacco: Never  Vaping Use   Vaping Use: Never used  Substance and Sexual Activity   Alcohol use: No   Drug use: No   Sexual activity: Not Currently  Other Topics Concern   Not on file  Social History Narrative   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    Lives with wife,    two children in [place].    Enjoys reading, boating, travelling to [place] in the winter with wife.   Social Determinants of Health   Financial Resource Strain: Low Risk  (12/19/2020)   Overall Financial Resource Strain (CARDIA)    Difficulty of Paying Living Expenses: Not hard at all  Food Insecurity: No Food Insecurity  (11/21/2021)   Hunger Vital Sign    Worried About Running Out of Food in the Last Year: Never true    Ran Out of Food in the Last Year: Never true  Transportation Needs: No Transportation Needs (11/21/2021)   PRAPARE - Hydrologist (Medical): No    Lack of Transportation (Non-Medical): No  Physical Activity: Sufficiently Active (12/19/2020)   Exercise Vital Sign    Days of Exercise per Week: 3 days    Minutes of Exercise per Session: 60 min  Stress: No Stress Concern Present (12/19/2020)   [place]    Feeling of Stress : Not at all  Social Connections: Socially Isolated (12/19/2020)   Social Connection and Isolation Panel [NHANES]    Frequency of Communication with Friends and Family: Once a week    Frequency of Social Gatherings with Friends and Family: Once a week    Attends Religious Services: Never    Marine scientist or Organizations: No    Attends Archivist Meetings: Never    Marital Status: Married   Vitals:   12/09/21 0949  BP: 120/60  Pulse: 87  Resp: 16  Temp: 97.9 F (36.6 C)  SpO2: 99%  Body mass index is 24.92 kg/m.  Physical Exam Vitals and nursing note reviewed.  Constitutional:      General: He is not in acute distress.    Appearance: He is well-developed.  HENT:     Head: Normocephalic and atraumatic.     Right Ear: Tympanic membrane, ear canal and external ear normal.     Left Ear: Tympanic membrane, ear canal and external ear normal.     Mouth/Throat:     Mouth: Mucous membranes are moist.     Pharynx: Oropharynx is clear.  Eyes:     Conjunctiva/sclera: Conjunctivae normal.  Cardiovascular:     Rate and Rhythm: Normal rate and regular rhythm.     Pulses:          Dorsalis pedis pulses are 2+ on the right side and 2+ on  the left side.     Heart sounds: No murmur heard.    Comments: Trace pitting LE edema, bilateral. Pulmonary:      Effort: Pulmonary effort is normal. No respiratory distress.     Breath sounds: Normal breath sounds.  Abdominal:     Palpations: Abdomen is soft. There is no hepatomegaly or mass.     Tenderness: There is no abdominal tenderness.  Lymphadenopathy:     Cervical: No cervical adenopathy.  Skin:    General: Skin is warm.     Coloration: Skin is pale.     Findings: No erythema or rash.  Neurological:     Mental Status: He is alert and oriented to person, place, and time.     Cranial Nerves: No cranial nerve deficit.     Comments: Antalgic gait, not assisted.  Psychiatric:        Mood and Affect: Mood and affect normal.   ASSESSMENT AND PLAN:  Mr.Ohm was seen today for hospitalization follow-up.  Diagnoses and all orders for this visit: Orders Placed This Encounter  Procedures   Basic metabolic panel   TSH   POC Hemoglobin   Neuropsychological assessment   Lab Results  Component Value Date   TSH 3.28 12/09/2021   Lab Results  Component Value Date   CREATININE 1.68 (H) 12/09/2021   BUN 38 (H) 12/09/2021   NA 135 12/09/2021   K 4.1 12/09/2021   CL 104 12/09/2021   CO2 25 12/09/2021   Iron deficiency anemia, unspecified iron deficiency anemia type Hg 8.0. Continue Fe Sulfate 325 mg daily. Has appt with GI today and planning on CBC.  Gastrointestinal hemorrhage associated with gastric ulcer  Problem has resolved. Has appt with GI today. Continue Protonix and Sucralfate same dose. Instructed about warning signs.  Memory difficulties Gradually getting worse. We discussed possible causes. Neuropsychologic evaluation will be arranged. Recommend cognitive challenging activities and games.  Hearing loss, unspecified hearing loss type, unspecified laterality Hearing screening today otherwise normal.  Hearing Screening   _0  _1  _2  _3   Right ear Fail Pass Pass Pass  Left ear Pass Pass Pass Pass   Essential hypertension BP adequately  controlled. Continue amlodipine 5 mg daily, metoprolol succinate 50 mg daily, and losartan-HCTZ 50-12.5 mg daily. Low-salt diet recommended. Monitor BP at home.  Generalized osteoarthritis of multiple sites Pain has gotten worse since discontinuation of oral diclofenac. He states that he has tried Voltaren gel, did not help. In the past he has tried tramadol, he reported that pain was not as well-controlled as he was with diclofenac. We will discussed options during following visits, explained that oral NSAIDs are absolutely contraindicated.  Type 2 diabetes mellitus with renal manifestations [facility]) His wife is still concerned about son BS in the 300s.  He was last seen by his endocrinologist yesterday and assessment/plan was discussed. Continue monitoring BS.  CKD (chronic kidney disease), stage III (HCC) Creatinine and eGFR have been a overall stable. Pending appointment with nephrologist, referral was placed yesterday by his endocrinologist. Recommend following low-salt diet and staying adequately hydrated. Adequate BP and glucose control.  I spent a total of 49 minutes in both face to face and non face to face activities for this visit on the date of this encounter. During this time history was obtained and documented, examination was performed, prior labs/imaging reviewed, and assessment/plan discussed.  Return in about 2 months (around 02/08/2022).  Arnecia Ector G. [name], MD  [facility]. [place] office.

## 2021-12-09 ENCOUNTER — Encounter: Payer: Self-pay | Admitting: Family Medicine

## 2021-12-09 ENCOUNTER — Other Ambulatory Visit: Payer: Medicare Other

## 2021-12-09 ENCOUNTER — Ambulatory Visit (INDEPENDENT_AMBULATORY_CARE_PROVIDER_SITE_OTHER): Payer: Medicare Other | Admitting: Family Medicine

## 2021-12-09 VITALS — BP 120/60 | HR 87 | Temp 97.9°F | Resp 16 | Ht 67.0 in | Wt 159.1 lb

## 2021-12-09 DIAGNOSIS — R413 Other amnesia: Secondary | ICD-10-CM | POA: Diagnosis not present

## 2021-12-09 DIAGNOSIS — N1832 Chronic kidney disease, stage 3b: Secondary | ICD-10-CM | POA: Diagnosis not present

## 2021-12-09 DIAGNOSIS — E1122 Type 2 diabetes mellitus with diabetic chronic kidney disease: Secondary | ICD-10-CM

## 2021-12-09 DIAGNOSIS — I10 Essential (primary) hypertension: Secondary | ICD-10-CM | POA: Diagnosis not present

## 2021-12-09 DIAGNOSIS — H9190 Unspecified hearing loss, unspecified ear: Secondary | ICD-10-CM | POA: Diagnosis not present

## 2021-12-09 DIAGNOSIS — K254 Chronic or unspecified gastric ulcer with hemorrhage: Secondary | ICD-10-CM | POA: Diagnosis not present

## 2021-12-09 DIAGNOSIS — M159 Polyosteoarthritis, unspecified: Secondary | ICD-10-CM

## 2021-12-09 DIAGNOSIS — D509 Iron deficiency anemia, unspecified: Secondary | ICD-10-CM | POA: Diagnosis not present

## 2021-12-09 HISTORY — PX: TOOTH EXTRACTION: SUR596

## 2021-12-09 LAB — BASIC METABOLIC PANEL
BUN: 38 mg/dL — ABNORMAL HIGH (ref 6–23)
CO2: 25 mEq/L (ref 19–32)
Calcium: 8.8 mg/dL (ref 8.4–10.5)
Chloride: 104 mEq/L (ref 96–112)
Creatinine, Ser: 1.68 mg/dL — ABNORMAL HIGH (ref 0.40–1.50)
GFR: 39.16 mL/min — ABNORMAL LOW (ref >60.00)
Glucose, Bld: 293 mg/dL — ABNORMAL HIGH (ref 70–99)
Potassium: 4.1 mEq/L (ref 3.5–5.1)
Sodium: 135 mEq/L (ref 135–145)

## 2021-12-09 LAB — POCT HEMOGLOBIN: Hemoglobin: 8 g/dL — AB (ref 11–14.6)

## 2021-12-09 LAB — TSH: TSH: 3.28 u[IU]/mL (ref 0.35–5.50)

## 2021-12-09 NOTE — Assessment & Plan Note (Signed)
His wife is still concerned about son BS in the 16s.  He was last seen by his endocrinologist yesterday and assessment/plan was discussed. Continue monitoring BS.

## 2021-12-09 NOTE — Telephone Encounter (Signed)
Patients wife called asking when the patient can stop the fiber diet and when Dr. Hilarie Fredrickson would like for him to do more labs.

## 2021-12-09 NOTE — Assessment & Plan Note (Signed)
Creatinine and eGFR have been a overall stable. Pending appointment with nephrologist, referral was placed yesterday by his endocrinologist. Recommend following low-salt diet and staying adequately hydrated. Adequate BP and glucose control. 

## 2021-12-09 NOTE — Assessment & Plan Note (Signed)
BP adequately controlled. Continue amlodipine 5 mg daily, metoprolol succinate 50 mg daily, and losartan-HCTZ 50-12.5 mg daily. Low-salt diet recommended. Monitor BP at home.

## 2021-12-09 NOTE — Telephone Encounter (Signed)
[name] I think this is a Daryl Webb. Used to be [name] but GM saw him in the hospital.

## 2021-12-09 NOTE — Assessment & Plan Note (Signed)
Pain has gotten worse since discontinuation of oral diclofenac. He states that he has tried Voltaren gel, did not help. In the past he has tried tramadol, he reported that pain was not as well-controlled as he was with diclofenac. We will discussed options during following visits, explained that oral NSAIDs are absolutely contraindicated.

## 2021-12-09 NOTE — Patient Instructions (Addendum)
A few things to remember from today's visit:   Essential hypertension  Type 2 diabetes mellitus with stage 3b chronic kidney disease, without long-term current use of insulin (HCC)  Stage 3b chronic kidney disease ([place]) - Plan: Basic metabolic panel  Generalized osteoarthritis of multiple sites  Iron deficiency anemia, unspecified iron deficiency anemia type - Plan: POC Hemoglobin  Hearing loss, unspecified hearing loss type, unspecified laterality  Memory difficulties - Plan: Neuropsychological assessment, Vitamin B12, TSH  No changes today. Continue iron supplementation. Pending appt with nephrologist. Hearing was otherwise normal. Memory Compensation Strategies  Use "WARM" strategy.  W= write it down  A= associate it  R= repeat it  M= make a mental note  2.   You can keep a Social worker.  Use a 3-ring notebook with sections for the following: calendar, important names and phone numbers,  medications, doctors' names/phone numbers, lists/reminders, and a section to journal what you did  each day.   3.    Use a calendar to write appointments down.  4.    Write yourself a schedule for the day.  This can be placed on the calendar or in a separate section of the Memory Notebook.  Keeping a  regular schedule can help memory.  5.    Use medication organizer with sections for each day or morning/evening pills.  You may need help loading it  6.    Keep a basket, or pegboard by the door.  Place items that you need to take out with you in the basket or on the pegboard.  You may also want to  include a message board for reminders.  7.    Use sticky notes.  Place sticky notes with reminders in a place where the task is performed.  For example: " turn off the  stove" placed by the stove, "lock the door" placed on the door at eye level, " take your medications" on  the bathroom mirror or by the place where you normally take your medications.  8.    Use alarms/timers.  Use while  cooking to remind yourself to check on food or as a reminder to take your medicine, or as a  reminder to make a call, or as a reminder to perform another task, etc.   If you need refills for medications you take chronically, please call your pharmacy. Do not use My Chart to request refills or for acute issues that need immediate attention. If you send a my chart message, it may take a few days to be addressed, specially if I am not in the office.  Please be sure medication list is accurate. If a new problem present, please set up appointment sooner than planned today.

## 2021-12-10 NOTE — Telephone Encounter (Signed)
Patients wife called to follow up on message below.

## 2021-12-11 NOTE — Telephone Encounter (Signed)
Left message on machine to call back  

## 2021-12-11 NOTE — Telephone Encounter (Signed)
The pt wife has asked when the pt can resume high fiber diet.  She has been advised that he should not make any changes until seen in the office on 11/27 bu [place]. She also wanted to report that he did have a good BM and will call back if he has further concerns.

## 2021-12-12 NOTE — Progress Notes (Incomplete)
HPI:  [name] is a 76 y.o. male, who is here today with his wife to follow on recent hospitalization. Daryl Webb was admitted on 10/22 and discharged home on 12/03/21. Initially admitted from 10/13-10/16/23, Daryl Webb presented to the ED c/p lightheadedness, SOB,and melena. Iron infusion. Hemoglobin on arrival 7.8 and dropped to 6.1. GI was consulted.Daryl Webb received 2 units of packed red blood cells. Daryl Webb was continued on pantoprazole infusion.   Daryl Webb underwent EGD, Daryl Webb was found to have Non-bleeding duodenal ulcer with pigmented material.  Presented to the ED again on 11/29/21 because SOB, recurrent melena,nausea,and vomiting. Hemoglobin was noted to be 7.1 and subsequently 6.6.   TCM call 12/07/21. EGD on 11/30/21: Fresh and clotted blood in the gastric body, erosions, cratered duodenal ulcers with active bleeding, vessel was clipped   Daryl Webb is on Protonix 40 mg bid and sucralfate. Daryl Webb has stopped Diclofenac.  Daryl Webb denies any blood in stool or melena, but reports constipation.Daryl Webb was instructed to follow low fiber diet and started on Fe Sulfate 325 mg daily. Daryl Webb denies any headache, chest pain, difficulty breathing, or palpitations.  Daryl Webb does report shortness of breath with exertion. Lab Results  Component Value Date   WBC 7.2 12/03/2021   HGB 8.0 (A) 12/09/2021   HCT 23.8 (L) 12/03/2021   MCV 96.7 12/03/2021   PLT 230 12/03/2021   HTN and CKD III: Daryl Webb is on Losartan-HCTZ 50-12.5 mg daily, Amlodipine 5 mg daily,and Metoprolol succinate 50 mg daily. Denies severe/frequent headache, visual changes, chest pain, palpitation, focal weakness, or edema.  DM II:  HgA1C 7.2 on 11/23/21. Yesterday Daryl Webb saw his endocrinologist. His wife is concerned about elevated BS's, 200-300's. Treatment has been adjusted. Daryl Webb is on Metformin, Farxiga,and Bydureon 2 mg weekly.  Lab Results  Component Value Date   CREATININE 1.63 (H) 12/02/2021   BUN 32 (H) 12/02/2021   NA 139 12/02/2021   K 3.8 12/02/2021   CL  113 (H) 12/02/2021   CO2 22 12/02/2021   Daryl Webb reports feeling better but still experiencing fatigue.  Daryl Webb has an appt with his GI today, planning on having CBC in his office. Lab Results  Component Value Date   ALT 57 (H) 12/02/2021   AST 48 (H) 12/02/2021   ALKPHOS 52 12/02/2021   BILITOT 0.8 12/02/2021   His wife reports concerns about his memory, which has been declining over the past year. There is no known family history of dementia. Difficulty retaining new information, short term memory. B12 deficiency on B12 supplementation. Lab Results  Component Value Date   VITAMINB12 246 11/21/2021   She is also reports long-standing hearing loss, which has not been evaluated in over 10 years.   Review of Systems  Constitutional:  Negative for activity change, appetite change, fatigue, fever and unexpected weight change.  HENT:  Negative for nosebleeds, sore throat and trouble swallowing.   Eyes:  Negative for redness and visual disturbance.  Respiratory:  Negative for cough, shortness of breath and wheezing.   Cardiovascular:  Negative for chest pain, palpitations and leg swelling.  Gastrointestinal:  Negative for abdominal pain, nausea and vomiting.  Genitourinary:  Negative for decreased urine volume, dysuria and hematuria.  Musculoskeletal:  Positive for arthralgias and back pain.  Neurological:  Negative for dizziness, seizures, weakness, numbness and headaches.  Psychiatric/Behavioral: Negative.     Rest see pertinent positives and negatives per HPI.  Current Outpatient Medications on File Prior to Visit  Medication Sig Dispense Refill  . amLODipine (NORVASC) 5 MG  tablet Take 1 tablet (5 mg total) by mouth daily. (Patient taking differently: Take 5 mg by mouth at bedtime.) 90 tablet 2  . amoxicillin (AMOXIL) 500 MG capsule Take 4 capsules (2,000 mg total) by mouth See admin instructions. Take 2000 mg 1 hour prior to dental procedures 12 capsule 2  . Apoaequorin (PREVAGEN PO) Take  1 capsule by mouth daily.    Marland Kitchen atorvastatin (LIPITOR) 40 MG tablet Take 1 tablet (40 mg total) by mouth daily. (Patient taking differently: Take 40 mg by mouth at bedtime.) 90 tablet 3  . calcium carbonate (TUMS - DOSED IN MG ELEMENTAL CALCIUM) 500 MG chewable tablet Chew 2 tablets by mouth daily as needed for indigestion or heartburn.    . Cholecalciferol (VITAMIN D3) 1000 units CAPS Take 2,000 mg by mouth daily.    . Coenzyme Q10 (COQ10) 100 MG CAPS Take 100 mg by mouth daily.    . cyanocobalamin (VITAMIN B12) 1000 MCG/ML injection Inject 1,000 mcg into the muscle every 30 (thirty) days.    . Exenatide ER (BYDUREON BCISE) 2 MG/0.85ML AUIJ Inject 2 mg into the skin once a week.    Marland Kitchen FARXIGA 10 MG TABS tablet Take 10 mg by mouth in the morning.    . ferrous gluconate (FERGON) 324 MG tablet Take 1 tablet (324 mg total) by mouth daily with breakfast. 30 tablet 3  . losartan-hydrochlorothiazide (HYZAAR) 50-12.5 MG tablet TAKE 1 TABLET DAILY (Patient taking differently: Take 1 tablet by mouth daily.) 90 tablet 1  . metFORMIN (GLUCOPHAGE) 500 MG tablet Take 500 mg by mouth 2 (two) times daily with a meal.    . metoprolol succinate (TOPROL-XL) 50 MG 24 hr tablet TAKE 1 TABLET DAILY (Patient taking differently: Take 50 mg by mouth daily.) 90 tablet 2  . NON FORMULARY Take 2 capsules by mouth See admin instructions. Vicks ZzzQuil Pure Zzzs Triple Action Gummies- Chew 2 gummies by mouth every night at bedtime    . nystatin ointment (MYCOSTATIN) Apply 1 application topically 3 (three) times daily. 30 g 0  . pantoprazole (PROTONIX) 40 MG tablet Take 1 tablet (40 mg total) by mouth 2 (two) times daily before a meal. 60 tablet 2  . sucralfate (CARAFATE) 1 g tablet Take 1 tablet (1 g total) by mouth 3 (three) times daily between meals. 90 tablet 0  . TYLENOL 500 MG tablet Take 1,000 mg by mouth every 6 (six) hours as needed for headache.     No current facility-administered medications on file prior to visit.    Past Medical History:  Diagnosis Date  . Acute blood loss anemia 06/27/2012  . Anemia   . Arthritis   . CKD (chronic kidney disease)   . Constipation due to pain medication    needs stool softner while on pain medication  . Diabetes mellitus   . Duodenal ulcer 08/13/2015   2014  - Dr Kaplan/GI  . Duodenal ulcer with hemorrhage 08/13/2015   2014  - Dr Kaplan/GI  . Dyspnea 06/19/2012  . GERD (gastroesophageal reflux disease)   . H/O: osteoarthritis   . History of kidney stones   . History of prostate cancer   . History of urethral stricture   . Hypercholesterolemia   . Hypertension   . Melena 06/19/2012  . Prostate cancer [facility])    prostate  . S/P TAVR (transcatheter aortic valve replacement) 08/12/2020   s/p TAVR with a 29 mm Edwards Sapien 3 THV via the TF approach by [name] and Dr.  Bartle  . Severe aortic stenosis   . Spinal stenosis   . Ulcer duodenal hemorrhage    No Known Allergies  Social History   Socioeconomic History  . Marital status: Married    Spouse name: Not on file  . Number of children: 2  . Years of education: 16  . Highest education level: Bachelor's degree (e.g., BA, AB, BS)  Occupational History  . Occupation: Retired  Tobacco Use  . Smoking status: Former    Packs/day: 1.50    Years: 20.00    Total pack years: 30.00    Types: Cigarettes    Quit date: 02/09/1980    Years since quitting: 41.8  . Smokeless tobacco: Never  Vaping Use  . Vaping Use: Never used  Substance and Sexual Activity  . Alcohol use: No  . Drug use: No  . Sexual activity: Not Currently  Other Topics Concern  . 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    Lives with wife,    two children in [place].    Enjoys reading, boating, travelling to [place] in the winter with wife.   Social Determinants of Health   Financial Resource Strain: Low Risk  (12/19/2020)   Overall Financial Resource Strain (CARDIA)   . Difficulty of Paying  Living Expenses: Not hard at all  Food Insecurity: No Food Insecurity (11/21/2021)   Hunger Vital Sign   . Worried About Charity fundraiser in the Last Year: Never true   . Ran Out of Food in the Last Year: Never true  Transportation Needs: No Transportation Needs (11/21/2021)   PRAPARE - Transportation   . Lack of Transportation (Medical): No   . Lack of Transportation (Non-Medical): No  Physical Activity: Sufficiently Active (12/19/2020)   Exercise Vital Sign   . Days of Exercise per Week: 3 days   . Minutes of Exercise per Session: 60 min  Stress: No Stress Concern Present (12/19/2020)   [place]   . Feeling of Stress : Not at all  Social Connections: Socially Isolated (12/19/2020)   Social Connection and Isolation Panel [NHANES]   . Frequency of Communication with Friends and Family: Once a week   . Frequency of Social Gatherings with Friends and Family: Once a week   . Attends Religious Services: Never   . Active Member of Clubs or Organizations: No   . Attends Archivist Meetings: Never   . Marital Status: Married   Vitals:   12/09/21 0949  BP: 120/60  Pulse: 87  Resp: 16  Temp: 97.9 F (36.6 C)  SpO2: 99%   Body mass index is 24.92 kg/m.  Physical Exam Vitals and nursing note reviewed.  Constitutional:      General: Daryl Webb is not in acute distress.    Appearance: Daryl Webb is well-developed.  HENT:     Head: Normocephalic and atraumatic.     Right Ear: Tympanic membrane, ear canal and external ear normal.     Left Ear: Tympanic membrane, ear canal and external ear normal.     Mouth/Throat:     Mouth: Mucous membranes are moist.     Pharynx: Oropharynx is clear.  Eyes:     Conjunctiva/sclera: Conjunctivae normal.  Cardiovascular:     Rate and Rhythm: Normal rate and regular rhythm.     Pulses:          Dorsalis pedis pulses are 2+ on the right  side and 2+ on the left side.     Heart  sounds: No murmur heard.    Comments: Trace pitting LE edema, bilateral. Pulmonary:     Effort: Pulmonary effort is normal. No respiratory distress.     Breath sounds: Normal breath sounds.  Abdominal:     Palpations: Abdomen is soft. There is no hepatomegaly or mass.     Tenderness: There is no abdominal tenderness.  Lymphadenopathy:     Cervical: No cervical adenopathy.  Skin:    General: Skin is warm.     Coloration: Skin is pale.     Findings: No erythema or rash.  Neurological:     Mental Status: Daryl Webb is alert and oriented to person, place, and time.     Cranial Nerves: No cranial nerve deficit.     Comments: Antalgic gait, not assisted.  Psychiatric:        Mood and Affect: Mood and affect normal.    ASSESSMENT AND PLAN:  Daryl Webb was seen today for hospitalization follow-up.  Diagnoses and all orders for this visit: Orders Placed This Encounter  Procedures  . Basic metabolic panel  . TSH  . POC Hemoglobin  . Neuropsychological assessment   Hg 8.0  Essential hypertension  Type 2 diabetes mellitus with stage 3b chronic kidney disease, without long-term current use of insulin (HCC)  Stage 3b chronic kidney disease ([place]) -     Basic metabolic panel; Future -     Basic metabolic panel  Generalized osteoarthritis of multiple sites  Iron deficiency anemia, unspecified iron deficiency anemia type -     POC Hemoglobin  Hearing loss, unspecified hearing loss type, unspecified laterality  Memory difficulties We dsicussed    Hearing Screening   _0  _1  _2  _3   Right ear Fail Pass Pass Pass  Left ear Pass Pass Pass Pass   Essential hypertension BP adequately controlled. Continue amlodipine 5 mg daily, metoprolol succinate 50 mg daily, and losartan-HCTZ 50-12.5 mg daily. Low-salt diet recommended. Monitor BP at home.  Generalized osteoarthritis of multiple sites Pain has gotten worse since discontinuation of oral diclofenac. Daryl Webb states that Daryl Webb  has tried Voltaren gel, did not help. In the past Daryl Webb has tried tramadol, Daryl Webb reported that pain was not as well-controlled as Daryl Webb was with diclofenac. We will discussed options during following visits, explained that oral NSAIDs are absolutely contraindicated.  Type 2 diabetes mellitus with renal manifestations [facility]) His wife is still concerned about son BS in the 300s.  Daryl Webb was last seen by his endocrinologist yesterday and assessment/plan was discussed. Continue monitoring BS.  CKD (chronic kidney disease), stage III (HCC) Creatinine and eGFR have been a overall stable. Pending appointment with nephrologist, referral was placed yesterday by his endocrinologist. Recommend following low-salt diet and staying adequately hydrated. Adequate BP and glucose control.  I spent a total of 49 minutes in both face to face and non face to face activities for this visit on the date of this encounter. During this time history was obtained and documented, examination was performed, prior labs/imaging reviewed, and assessment/plan discussed.  Return in about 2 months (around 02/08/2022).  Robbi Scurlock G. [name], MD  [facility]. [place] office.

## 2021-12-16 ENCOUNTER — Other Ambulatory Visit (INDEPENDENT_AMBULATORY_CARE_PROVIDER_SITE_OTHER): Payer: Medicare Other

## 2021-12-16 ENCOUNTER — Other Ambulatory Visit: Payer: Self-pay

## 2021-12-16 DIAGNOSIS — K921 Melena: Secondary | ICD-10-CM

## 2021-12-16 LAB — CBC WITH DIFFERENTIAL/PLATELET
Basophils Absolute: 0 10*3/uL (ref 0.0–0.1)
Basophils Relative: 0.5 % (ref 0.0–3.0)
Eosinophils Absolute: 0.2 10*3/uL (ref 0.0–0.7)
Eosinophils Relative: 2.5 % (ref 0.0–5.0)
HCT: 29.7 % — ABNORMAL LOW (ref 39.0–52.0)
Hemoglobin: 9.3 g/dL — ABNORMAL LOW (ref 13.0–17.0)
Lymphocytes Relative: 20.5 % (ref 12.0–46.0)
Lymphs Abs: 1.7 10*3/uL (ref 0.7–4.0)
MCHC: 31.3 g/dL (ref 30.0–36.0)
MCV: 91.4 fl (ref 78.0–100.0)
Monocytes Absolute: 0.6 10*3/uL (ref 0.1–1.0)
Monocytes Relative: 7.5 % (ref 3.0–12.0)
Neutro Abs: 5.8 10*3/uL (ref 1.4–7.7)
Neutrophils Relative %: 69 % (ref 43.0–77.0)
Platelets: 249 10*3/uL (ref 150.0–400.0)
RBC: 3.25 Mil/uL — ABNORMAL LOW (ref 4.22–5.81)
RDW: 17.2 % — ABNORMAL HIGH (ref 11.5–15.5)
WBC: 8.4 10*3/uL (ref 4.0–10.5)

## 2021-12-24 ENCOUNTER — Ambulatory Visit (INDEPENDENT_AMBULATORY_CARE_PROVIDER_SITE_OTHER): Payer: Medicare Other

## 2021-12-24 VITALS — Ht 67.0 in | Wt 155.0 lb

## 2021-12-24 DIAGNOSIS — Z0000 Encounter for general adult medical examination without abnormal findings: Secondary | ICD-10-CM

## 2021-12-24 NOTE — Progress Notes (Signed)
Subjective:   Daryl Webb is a 76 y.o. male who presents for Medicare Annual/Subsequent preventive examination.  Review of Systems    Virtual Visit via Telephone Note  I connected with  Daryl Webb on 12/24/21 at 11:00 AM EST by telephone and verified that I am speaking with the correct person using two identifiers.  Location: Patient: Home Provider: Office Persons participating in the virtual visit: patient/Nurse Health Advisor   I discussed the limitations, risks, security and privacy concerns of performing an evaluation and management service by telephone and the availability of in person appointments. The patient expressed understanding and agreed to proceed.  Interactive audio and video telecommunications were attempted between this nurse and patient, however failed, due to patient having technical difficulties OR patient did not have access to video capability.  We continued and completed visit with audio only.  Some vital signs may be absent or patient reported.   Criselda Peaches, LPN  Cardiac Risk Factors include: advanced age (>85mn, >>23women);diabetes mellitus;hypertension;male gender     Objective:    Today's Vitals   12/24/21 1102  Weight: 155 lb (70.3 kg)  Height: '5\' 7"'$  (1.702 m)  PainSc: 0-No pain   Body mass index is 24.28 kg/m.     12/24/2021   11:11 AM 12/03/2021    9:40 AM 12/03/2021    9:37 AM 11/30/2021    1:31 PM 11/29/2021   10:21 AM 11/23/2021   11:09 AM 11/20/2021    8:44 PM  Advanced Directives  Does Patient Have a Medical Advance Directive? Yes Yes  Yes Yes Yes No  Type of AParamedicof AIron JunctionLiving will HLyonLiving will HBylasLiving will HWolfeLiving will HErnestLiving will HThompson FallsLiving will   Does patient want to make changes to medical advance directive?  No - Patient declined        Copy of HPawnee Cityin Chart? No - copy requested   Yes - validated most recent copy scanned in chart (See row information)  No - copy requested   Would patient like information on creating a medical advance directive?       No - Patient declined    Current Medications (verified) Outpatient Encounter Medications as of 12/24/2021  Medication Sig   amLODipine (NORVASC) 5 MG tablet Take 1 tablet (5 mg total) by mouth daily. (Patient taking differently: Take 5 mg by mouth at bedtime.)   amoxicillin (AMOXIL) 500 MG capsule Take 4 capsules (2,000 mg total) by mouth See admin instructions. Take 2000 mg 1 hour prior to dental procedures   Apoaequorin (PREVAGEN PO) Take 1 capsule by mouth daily.   atorvastatin (LIPITOR) 40 MG tablet Take 1 tablet (40 mg total) by mouth daily. (Patient taking differently: Take 40 mg by mouth at bedtime.)   calcium carbonate (TUMS - DOSED IN MG ELEMENTAL CALCIUM) 500 MG chewable tablet Chew 2 tablets by mouth daily as needed for indigestion or heartburn.   Cholecalciferol (VITAMIN D3) 1000 units CAPS Take 2,000 mg by mouth daily.   Coenzyme Q10 (COQ10) 100 MG CAPS Take 100 mg by mouth daily.   cyanocobalamin (VITAMIN B12) 1000 MCG/ML injection Inject 1,000 mcg into the muscle every 30 (thirty) days.   Exenatide ER (BYDUREON BCISE) 2 MG/0.85ML AUIJ Inject 2 mg into the skin once a week.   FARXIGA 10 MG TABS tablet Take 10 mg by mouth in the morning.  ferrous gluconate (FERGON) 324 MG tablet Take 1 tablet (324 mg total) by mouth daily with breakfast.   losartan-hydrochlorothiazide (HYZAAR) 50-12.5 MG tablet TAKE 1 TABLET DAILY (Patient taking differently: Take 1 tablet by mouth daily.)   metFORMIN (GLUCOPHAGE) 500 MG tablet Take 500 mg by mouth 2 (two) times daily with a meal.   metoprolol succinate (TOPROL-XL) 50 MG 24 hr tablet TAKE 1 TABLET DAILY (Patient taking differently: Take 50 mg by mouth daily.)   NON FORMULARY Take 2 capsules by mouth See  admin instructions. Vicks ZzzQuil Pure Zzzs Triple Action Gummies- Chew 2 gummies by mouth every night at bedtime   nystatin ointment (MYCOSTATIN) Apply 1 application topically 3 (three) times daily.   pantoprazole (PROTONIX) 40 MG tablet Take 1 tablet (40 mg total) by mouth 2 (two) times daily before a meal.   sucralfate (CARAFATE) 1 g tablet Take 1 tablet (1 g total) by mouth 3 (three) times daily between meals.   TYLENOL 500 MG tablet Take 1,000 mg by mouth every 6 (six) hours as needed for headache.   No facility-administered encounter medications on file as of 12/24/2021.    Allergies (verified) Patient has no known allergies.   History: Past Medical History:  Diagnosis Date   Acute blood loss anemia 06/27/2012   Anemia    Arthritis    CKD (chronic kidney disease)    Constipation due to pain medication    needs stool softner while on pain medication   Diabetes mellitus    Duodenal ulcer 08/13/2015   2014  - Dr Kaplan/GI   Duodenal ulcer with hemorrhage 08/13/2015   2014  - Dr Kaplan/GI   Dyspnea 06/19/2012   GERD (gastroesophageal reflux disease)    H/O: osteoarthritis    History of kidney stones    History of prostate cancer    History of urethral stricture    Hypercholesterolemia    Hypertension    Melena 06/19/2012   Prostate cancer ([place])    prostate   S/P TAVR (transcatheter aortic valve replacement) 08/12/2020   s/p TAVR with a 29 mm Edwards Sapien 3 THV via the TF approach by [name] and [name]   Severe aortic stenosis    Spinal stenosis    Ulcer duodenal hemorrhage    Past Surgical History:  Procedure Laterality Date   ARTHROPLASTY  2004   right knee [name] SURGERY     BIOPSY  11/21/2021   Procedure: BIOPSY;  Surgeon: Doran Stabler, MD;  Location: [facility];  Service: Gastroenterology;;   CARDIAC CATHETERIZATION     CARPAL TUNNEL RELEASE     right hand,wrist [name]   CYSTOSCOPY N/A 09/20/2012   Procedure: Consuela Mimes;   Surgeon: [name], MD;  Location: [facility];  Service: Urology;  Laterality: N/A;   ESOPHAGOGASTRODUODENOSCOPY N/A 11/21/2021   Procedure: ESOPHAGOGASTRODUODENOSCOPY (EGD);  Surgeon: Doran Stabler, MD;  Location: [facility];  Service: Gastroenterology;  Laterality: N/A;   ESOPHAGOGASTRODUODENOSCOPY (EGD) WITH PROPOFOL N/A 11/30/2021   Procedure: ESOPHAGOGASTRODUODENOSCOPY (EGD) WITH PROPOFOL;  Surgeon: Jerene Bears, MD;  Location: [facility];  Service: Gastroenterology;  Laterality: N/A;   HEMOSTASIS CLIP PLACEMENT  11/30/2021   Procedure: HEMOSTASIS CLIP PLACEMENT;  Surgeon: Jerene Bears, MD;  Location: [facility];  Service: Gastroenterology;;   JOINT REPLACEMENT Right    KNEE ARTHROSCOPY Right    LUMBAR LAMINECTOMY  02/08/2013   PROSTATECTOMY     radical   RIGHT/LEFT HEART CATH AND CORONARY  ANGIOGRAPHY N/A 05/08/2020   Procedure: RIGHT/LEFT HEART CATH AND CORONARY ANGIOGRAPHY;  Surgeon: [name], MD;  Location: [facility];  Service: Cardiovascular;  Laterality: N/A;   TEE WITHOUT CARDIOVERSION N/A 08/12/2020   Procedure: TRANSESOPHAGEAL ECHOCARDIOGRAM (TEE);  Surgeon: [name], MD;  Location: [facility];  Service: Open Heart Surgery;  Laterality: N/A;   TRANSCATHETER AORTIC VALVE REPLACEMENT, TRANSFEMORAL N/A 08/12/2020   Procedure: TRANSCATHETER AORTIC VALVE REPLACEMENT, TRANSFEMORAL;  Surgeon: [name], MD;  Location: [facility];  Service: Open Heart Surgery;  Laterality: N/A;   VASECTOMY  1985   Family History  Problem Relation Age of Onset   Heart disease Mother    Heart failure Mother    Heart disease Father    Diabetes Father    Social History   Socioeconomic History   Marital status: Married    Spouse name: Not on file   Number of children: 2   Years of education: 16   Highest education level: Bachelor's degree (e.g., BA, AB, BS)  Occupational History   Occupation: Retired  Tobacco Use   Smoking status:  Former    Packs/day: 1.50    Years: 20.00    Total pack years: 30.00    Types: Cigarettes    Quit date: 02/09/1980    Years since quitting: 41.9   Smokeless tobacco: Never  Vaping Use   Vaping Use: Never used  Substance and Sexual Activity   Alcohol use: No   Drug use: No   Sexual activity: Not Currently  Other Topics Concern   Not on file  Social History Narrative   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    Lives with wife,    two children in [place].    Enjoys reading, boating, travelling to [place] in the winter with wife.   Social Determinants of Health   Financial Resource Strain: Low Risk  (12/24/2021)   Overall Financial Resource Strain (CARDIA)    Difficulty of Paying Living Expenses: Not hard at all  Food Insecurity: No Food Insecurity (12/24/2021)   Hunger Vital Sign    Worried About Running Out of Food in the Last Year: Never true    Ran Out of Food in the Last Year: Never true  Transportation Needs: No Transportation Needs (12/24/2021)   PRAPARE - Hydrologist (Medical): No    Lack of Transportation (Non-Medical): No  Physical Activity: Inactive (12/24/2021)   Exercise Vital Sign    Days of Exercise per Week: 0 days    Minutes of Exercise per Session: 0 min  Stress: No Stress Concern Present (12/24/2021)   [place]    Feeling of Stress : Not at all  Social Connections: Moderately Isolated (12/24/2021)   Social Connection and Isolation Panel [NHANES]    Frequency of Communication with Friends and Family: More than three times a week    Frequency of Social Gatherings with Friends and Family: More than three times a week    Attends Religious Services: Never    Marine scientist or Organizations: No    Attends Music therapist: Never    Marital Status: Married    Tobacco Counseling Counseling given: Not Answered   Clinical Intake:  Pre-visit  preparation completed: NoNutrition Risk Assessment:  Has the patient had any N/V/D within the last 2 months?  No  Does the patient have any non-healing wounds?  No  Has the patient  had any unintentional weight loss or weight gain?  No   Diabetes:  Is the patient diabetic?  Yes  If diabetic, was a CBG obtained today?  No  Did the patient bring in their glucometer from home?  No  How often do you monitor your CBG's? Daily.   Financial Strains and Diabetes Management:  Are you having any financial strains with the device, your supplies or your medication? No .  Does the patient want to be seen by Chronic Care Management for management of their diabetes?  No  Would the patient like to be referred to a Nutritionist or for Diabetic Management?  No   Diabetic Exams:  Diabetic Eye Exam: Completed No. Overdue for diabetic eye exam. Pt has been advised about the importance in completing this exam. A referral has been placed today. Message sent to referral coordinator for scheduling purposes. Advised pt to expect a call from office referred to regarding appt.  Diabetic Foot Exam: Completed No. Pt has been advised about the importance in completing this exam. Pt is scheduled for diabetic foot exam on Followed by Dr Buddy Duty.    Pain : No/denies pain Pain Score: 0-No pain     BMI - recorded: 24.28 Nutritional Status: BMI of 19-24  Normal Nutritional Risks: None Diabetes: Yes CBG done?: No Did pt. bring in CBG monitor from home?: No  How often do you need to have someone help you when you read instructions, pamphlets, or other written materials from your doctor or pharmacy?: 1 - Never  Diabetic?  Yes  Interpreter Needed?: No  Information entered by :: [name]   Activities of Daily Living    12/24/2021   11:10 AM 12/03/2021    9:41 AM  In your present state of health, do you have any difficulty performing the following activities:  Hearing? 0   Vision? 0   Difficulty  concentrating or making decisions? 0   Walking or climbing stairs? 0   Dressing or bathing? 0   Doing errands, shopping? 0 0  Comment  wife  Preparing Food and eating ? N   Using the Toilet? N   In the past six months, have you accidently leaked urine? N   Do you have problems with loss of bowel control? N   Managing your Medications? N   Managing your Finances? N   Housekeeping or managing your Housekeeping? N     Patient Care Team: [name], [name], MD as PCP - General (Family Medicine) Debara Pickett Nadean Corwin, MD as PCP - Cardiology (Cardiology) [name], MD as Consulting Physician (Endocrinology) Rutherford Guys, MD as Consulting Physician (Ophthalmology) Germaine Pomfret, [facility] as Pharmacist (Pharmacist)  Indicate any recent Medical Services you may have received from other than [facility] providers in the past year (date may be approximate).     Assessment:   This is a routine wellness examination for Daryl Webb.  Hearing/Vision screen Hearing Screening - Comments:: Denies hearing difficulties   Vision Screening - Comments:: Wears rx glasses - up to date with routine eye exams with  Dr Gershon Crane  Dietary issues and exercise activities discussed: Current Exercise Habits: The patient does not participate in regular exercise at present, Exercise limited by: None identified   Goals Addressed               This Visit's Progress     No current goals (pt-stated)         Depression Screen    12/24/2021   11:09  AM 12/09/2021   10:33 AM 11/06/2021   11:19 AM 07/27/2021   11:22 AM 04/22/2021   10:59 PM 12/26/2020    3:14 PM 12/19/2020   10:54 AM  PHQ 2/9 Scores  PHQ - 2 Score 0 0 0 0 2 0 0  PHQ- 9 Score 0 '5 4  9      '$ Fall Risk    12/24/2021   11:11 AM 12/09/2021   10:33 AM 11/06/2021   11:20 AM 07/27/2021   11:22 AM 12/19/2020   10:59 AM  Fall Risk   Falls in the past year? 0 0 0 0 0  Number falls in past yr: 0 0 0 0 0  Injury with Fall? 0 0 0 0 0  Risk for fall due to :  No Fall Risks Other (Comment)  No Fall Risks   Follow up Falls prevention discussed Falls evaluation completed  Falls evaluation completed     [place]:  Any stairs in or around the home? Yes  If so, are there any without handrails? No  Home free of loose throw rugs in walkways, pet beds, electrical cords, etc? Yes  Adequate lighting in your home to reduce risk of falls? Yes   ASSISTIVE DEVICES UTILIZED TO PREVENT FALLS:  Life alert? No  Use of a cane, walker or w/c? No  Grab bars in the bathroom? No  Shower chair or bench in shower? No  Elevated toilet seat or a handicapped toilet? Yes   TIMED UP AND GO:  Was the test performed? No . Audio Visit   Cognitive Function:    02/20/2018    1:41 PM  MMSE - Mini Mental State Exam  Orientation to time 5  Orientation to Place 5  Registration 3  Attention/ Calculation 5  Recall 3  Language- name 2 objects 2  Language- repeat 1  Language- follow 3 step command 3  Language- read & follow direction 1  Write a sentence 1  Copy design 1  Total score 30        12/24/2021   11:11 AM 12/19/2020   11:02 AM 03/13/2019   11:01 AM 02/15/2017    3:56 PM  6CIT Screen  What Year? 0 points 0 points 0 points 0 points  What month? 0 points 0 points 0 points 0 points  What time? 0 points 0 points 0 points 0 points  Count back from 20 0 points 0 points 0 points 0 points  Months in reverse 0 points 0 points 0 points 0 points  Repeat phrase 0 points 0 points 0 points 0 points  Total Score 0 points 0 points 0 points 0 points    Immunizations Immunization History  Administered Date(s) Administered   Influenza, High Dose Seasonal PF 12/03/2016, 11/03/2018, 09/08/2020   Influenza-Unspecified 01/08/2017   Moderna Covid-19 Vaccine Bivalent Booster 14yr & up 06/25/2021   PFIZER(Purple Top)SARS-COV-2 Vaccination 03/12/2019, 04/02/2019, 11/07/2019, 11/09/2020   Pneumococcal Conjugate-13 12/25/2013    Pneumococcal Polysaccharide-23 01/28/2017   Zoster Recombinat (Shingrix) 09/14/2018, 11/17/2018    TDAP status: Due, Education has been provided regarding the importance of this vaccine. Advised may receive this vaccine at local pharmacy or Health Dept. Aware to provide a copy of the vaccination record if obtained from local pharmacy or Health Dept. Verbalized acceptance and understanding.  Flu Vaccine status: Up to date  Pneumococcal vaccine status: Up to date  Covid-19 vaccine status: Completed vaccines  Qualifies for Shingles Vaccine? Yes  Zostavax completed Yes   Shingrix Completed?: Yes  Screening Tests Health Maintenance  Topic Date Due   Diabetic kidney evaluation - Urine ACR  11/18/2020   COVID-19 Vaccine (6 - Pfizer series) 01/09/2022 (Originally 10/26/2021)   INFLUENZA VACCINE  05/09/2022 (Originally 09/08/2021)   TETANUS/TDAP  12/25/2022 (Originally 03/10/1964)   OPHTHALMOLOGY EXAM  01/05/2022   HEMOGLOBIN A1C  05/25/2022   FOOT EXAM  07/30/2022   Diabetic kidney evaluation - GFR measurement  12/10/2022   Medicare Annual Wellness (AWV)  12/25/2022   Pneumonia Vaccine 60+ Years old  Completed   Hepatitis C Screening  Completed   Zoster Vaccines- Shingrix  Completed   HPV VACCINES  Aged Out   COLONOSCOPY (Pts 45-10yr Insurance coverage will need to be confirmed)  Discontinued    Health Maintenance  Health Maintenance Due  Topic Date Due   Diabetic kidney evaluation - Urine ACR  11/18/2020    Colorectal cancer screening: No longer required.   Lung Cancer Screening: (Low Dose CT Chest recommended if Age 263-80years, 30 pack-year currently smoking OR have quit w/in 15years.) does not qualify.     Additional Screening:  Hepatitis C Screening: does qualify; Completed 07/19/16  Vision Screening: Recommended annual ophthalmology exams for early detection of glaucoma and other disorders of the eye. Is the patient up to date with their annual eye exam?  Yes  Who is  the provider or what is the name of the office in which the patient attends annual eye exams? Dr SGershon CraneIf pt is not established with a provider, would they like to be referred to a provider to establish care? No .   Dental Screening: Recommended annual dental exams for proper oral hygiene  Community Resource Referral / Chronic Care Management:  CRR required this visit?  No   CCM required this visit?  No      Plan:     I have personally reviewed and noted the following in the patient's chart:   Medical and social history Use of alcohol, tobacco or illicit drugs  Current medications and supplements including opioid prescriptions. Patient is not currently taking opioid prescriptions. Functional ability and status Nutritional status Physical activity Advanced directives List of other physicians Hospitalizations, surgeries, and ER visits in previous 12 months Vitals Screenings to include cognitive, depression, and falls Referrals and appointments  In addition, I have reviewed and discussed with patient certain preventive protocols, quality metrics, and best practice recommendations. A written personalized care plan for preventive services as well as general preventive health recommendations were provided to patient.     BCriselda Peaches LPN   [id number]  Nurse Notes: Patient due Diabetic kidney evaluation- Urine ACR

## 2021-12-24 NOTE — Patient Instructions (Addendum)
Daryl Webb , Thank you for taking time to come for your Medicare Wellness Visit. I appreciate your ongoing commitment to your health goals. Please review the following plan we discussed and let me know if I can assist you in the future.   These are the goals we discussed:  Goals       Chronic Care Management      CARE PLAN ENTRY  Current Barriers:  Chronic Disease Management support, education, and care coordination needs related to Hypertension, Hyperlipidemia, and Diabetes   Hypertension BP Readings from Last 3 Encounters:  07/04/19 130/74  05/18/19 110/70  04/11/19 134/70  Pharmacist Clinical Goal(s): Over the next 120 days, patient will work with PharmD and providers to maintain BP goal <130/80 Current regimen:  Amlodipine 5 mg 1 tablet daily Losartan/HCTZ 50-12.5 mg 1 tablet daily Metoprolol succinate 50 mg 1 tablet daily Interventions: Discussed low salt diet and exercising as tolerated extensively Patient self care activities - Over the next 120 days, patient will: Check BP 2-3 times aw eek, document, and provide at future appointments Ensure daily salt intake < 2300 mg/day  Hyperlipidemia Lab Results  Component Value Date/Time   LDLCALC 43 06/28/2017 09:15 AM   LDLDIRECT 73.8 11/20/2012 08:40 AM  Pharmacist Clinical Goal(s): Over the next 120 days, patient will work with PharmD and providers to maintain LDL goal < 100 Current regimen:  Atorvastatin 40 mg 1 tablet daily  Interventions: Discussed low cholesterol diet and exercising as tolerated extensively Patient self care activities - Over the next 120 days, patient will: Continue eating heart healthy diet  Diabetes Lab Results  Component Value Date/Time   HGBA1C 6.7 (H) 06/19/2015 09:06 AM   HGBA1C 7.1 (H) 01/06/2015 08:22 AM  Pharmacist Clinical Goal(s): Over the next 120 days, patient will work with PharmD and providers to maintain A1c goal <7% Current regimen:  Bydureon 2 mg pen inject 1 dose into  the skin once a week Metformin 500 mg 1 tablet twice daily Interventions: Discussed carbohydrate counting and exercising as tolerated extensively Patient self care activities - Over the next 120 days, patient will: Check blood sugar once daily, document, and provide at future appointments Contact provider with any episodes of hypoglycemia  Medication management Pharmacist Clinical Goal(s): Over the next 120 days, patient will work with PharmD and providers to maintain optimal medication adherence Current pharmacy: [facility] Interventions Comprehensive medication review performed. Continue current medication management strategy Patient self care activities - Over the next 120 days, patient will: Focus on medication adherence by using his own system every morning Take medications as prescribed Report any questions or concerns to PharmD and/or provider(s)  Initial goal documentation       DIET - INCREASE WATER INTAKE      Exercise 150 min/wk Moderate Activity      Will try to walk more;  Try to walk 1 mile  per day your neighborhood.        No current goals (pt-stated)        This is a list of the screening recommended for you and due dates:  Health Maintenance  Topic Date Due   Yearly kidney health urinalysis for diabetes  11/18/2020   COVID-19 Vaccine (6 - Pfizer series) 01/09/2022*   Flu Shot  05/09/2022*   Tetanus Vaccine  12/25/2022*   Eye exam for diabetics  01/05/2022   Hemoglobin A1C  05/25/2022   Complete foot exam   07/30/2022   Yearly kidney function blood test for diabetes  12/10/2022   Medicare Annual Wellness Visit  12/25/2022   Pneumonia Vaccine  Completed   Hepatitis C Screening: USPSTF Recommendation to screen - Ages 18-79 yo.  Completed   Zoster (Shingles) Vaccine  Completed   HPV Vaccine  Aged Out   Colon Cancer Screening  Discontinued  *Topic was postponed. The date shown is not the original due date.    Advanced directives: Please bring a copy  of your health care power of attorney and living will to the office to be added to your chart at your convenience.   Conditions/risks identified: None  Next appointment: Follow up in one year for your annual wellness visit.    Preventive Care 69 Years and Older, Male  Preventive care refers to lifestyle choices and visits with your health care provider that can promote health and wellness. What does preventive care include? A yearly physical exam. This is also called an annual well check. Dental exams once or twice a year. Routine eye exams. Ask your health care provider how often you should have your eyes checked. Personal lifestyle choices, including: Daily care of your teeth and gums. Regular physical activity. Eating a healthy diet. Avoiding tobacco and drug use. Limiting alcohol use. Practicing safe sex. Taking low doses of aspirin every day. Taking vitamin and mineral supplements as recommended by your health care provider. What happens during an annual well check? The services and screenings done by your health care provider during your annual well check will depend on your age, overall health, lifestyle risk factors, and family history of disease. Counseling  Your health care provider may ask you questions about your: Alcohol use. Tobacco use. Drug use. Emotional well-being. Home and relationship well-being. Sexual activity. Eating habits. History of falls. Memory and ability to understand (cognition). Work and work Statistician. Screening  You may have the following tests or measurements: Height, weight, and BMI. Blood pressure. Lipid and cholesterol levels. These may be checked every 5 years, or more frequently if you are over 66 years old. Skin check. Lung cancer screening. You may have this screening every year starting at age 27 if you have a 30-pack-year history of smoking and currently smoke or have quit within the past 15 years. Fecal occult blood test  (FOBT) of the stool. You may have this test every year starting at age 72. Flexible sigmoidoscopy or colonoscopy. You may have a sigmoidoscopy every 5 years or a colonoscopy every 10 years starting at age 46. Prostate cancer screening. Recommendations will vary depending on your family history and other risks. Hepatitis C blood test. Hepatitis B blood test. Sexually transmitted disease (STD) testing. Diabetes screening. This is done by checking your blood sugar (glucose) after you have not eaten for a while (fasting). You may have this done every 1-3 years. Abdominal aortic aneurysm (AAA) screening. You may need this if you are a current or former smoker. Osteoporosis. You may be screened starting at age 64 if you are at high risk. Talk with your health care provider about your test results, treatment options, and if necessary, the need for more tests. Vaccines  Your health care provider may recommend certain vaccines, such as: Influenza vaccine. This is recommended every year. Tetanus, diphtheria, and acellular pertussis (Tdap, Td) vaccine. You may need a Td booster every 10 years. Zoster vaccine. You may need this after age 72. Pneumococcal 13-valent conjugate (PCV13) vaccine. One dose is recommended after age 50. Pneumococcal polysaccharide (PPSV23) vaccine. One dose is recommended after age 52. Talk  to your health care provider about which screenings and vaccines you need and how often you need them. This information is not intended to replace advice given to you by your health care provider. Make sure you discuss any questions you have with your health care provider. Document Released: 02/21/2015 Document Revised: 10/15/2015 Document Reviewed: 11/26/2014 Elsevier Interactive Patient Education  2017 [place] Prevention in the Home Falls can cause injuries. They can happen to people of all ages. There are many things you can do to make your home safe and to help prevent  falls. What can I do on the outside of my home? Regularly fix the edges of walkways and driveways and fix any cracks. Remove anything that might make you trip as you walk through a door, such as a raised step or threshold. Trim any bushes or trees on the path to your home. Use bright outdoor lighting. Clear any walking paths of anything that might make someone trip, such as rocks or tools. Regularly check to see if handrails are loose or broken. Make sure that both sides of any steps have handrails. Any raised decks and porches should have guardrails on the edges. Have any leaves, snow, or ice cleared regularly. Use sand or salt on walking paths during winter. Clean up any spills in your garage right away. This includes oil or grease spills. What can I do in the bathroom? Use night lights. Install grab bars by the toilet and in the tub and shower. Do not use towel bars as grab bars. Use non-skid mats or decals in the tub or shower. If you need to sit down in the shower, use a plastic, non-slip stool. Keep the floor dry. Clean up any water that spills on the floor as soon as it happens. Remove soap buildup in the tub or shower regularly. Attach bath mats securely with double-sided non-slip rug tape. Do not have throw rugs and other things on the floor that can make you trip. What can I do in the bedroom? Use night lights. Make sure that you have a light by your bed that is easy to reach. Do not use any sheets or blankets that are too big for your bed. They should not hang down onto the floor. Have a firm chair that has side arms. You can use this for support while you get dressed. Do not have throw rugs and other things on the floor that can make you trip. What can I do in the kitchen? Clean up any spills right away. Avoid walking on wet floors. Keep items that you use a lot in easy-to-reach places. If you need to reach something above you, use a strong step stool that has a grab  bar. Keep electrical cords out of the way. Do not use floor polish or wax that makes floors slippery. If you must use wax, use non-skid floor wax. Do not have throw rugs and other things on the floor that can make you trip. What can I do with my stairs? Do not leave any items on the stairs. Make sure that there are handrails on both sides of the stairs and use them. Fix handrails that are broken or loose. Make sure that handrails are as long as the stairways. Check any carpeting to make sure that it is firmly attached to the stairs. Fix any carpet that is loose or worn. Avoid having throw rugs at the top or bottom of the stairs. If you do have throw rugs,  attach them to the floor with carpet tape. Make sure that you have a light switch at the top of the stairs and the bottom of the stairs. If you do not have them, ask someone to add them for you. What else can I do to help prevent falls? Wear shoes that: Do not have high heels. Have rubber bottoms. Are comfortable and fit you well. Are closed at the toe. Do not wear sandals. If you use a stepladder: Make sure that it is fully opened. Do not climb a closed stepladder. Make sure that both sides of the stepladder are locked into place. Ask someone to hold it for you, if possible. Clearly mark and make sure that you can see: Any grab bars or handrails. First and last steps. Where the edge of each step is. Use tools that help you move around (mobility aids) if they are needed. These include: Canes. Walkers. Scooters. Crutches. Turn on the lights when you go into a dark area. Replace any light bulbs as soon as they burn out. Set up your furniture so you have a clear path. Avoid moving your furniture around. If any of your floors are uneven, fix them. If there are any pets around you, be aware of where they are. Review your medicines with your doctor. Some medicines can make you feel dizzy. This can increase your chance of falling. Ask  your doctor what other things that you can do to help prevent falls. This information is not intended to replace advice given to you by your health care provider. Make sure you discuss any questions you have with your health care provider. Document Released: 11/21/2008 Document Revised: 07/03/2015 Document Reviewed: 03/01/2014 Elsevier Interactive Patient Education  2017 [employer].

## 2021-12-28 ENCOUNTER — Other Ambulatory Visit (INDEPENDENT_AMBULATORY_CARE_PROVIDER_SITE_OTHER): Payer: Medicare Other

## 2021-12-28 DIAGNOSIS — K921 Melena: Secondary | ICD-10-CM

## 2021-12-28 LAB — CBC WITH DIFFERENTIAL/PLATELET
Basophils Absolute: 0 10*3/uL (ref 0.0–0.1)
Basophils Relative: 0.6 % (ref 0.0–3.0)
Eosinophils Absolute: 0.3 10*3/uL (ref 0.0–0.7)
Eosinophils Relative: 3.6 % (ref 0.0–5.0)
HCT: 31.8 % — ABNORMAL LOW (ref 39.0–52.0)
Hemoglobin: 10.3 g/dL — ABNORMAL LOW (ref 13.0–17.0)
Lymphocytes Relative: 21 % (ref 12.0–46.0)
Lymphs Abs: 1.7 10*3/uL (ref 0.7–4.0)
MCHC: 32.3 g/dL (ref 30.0–36.0)
MCV: 89.1 fl (ref 78.0–100.0)
Monocytes Absolute: 0.7 10*3/uL (ref 0.1–1.0)
Monocytes Relative: 8.3 % (ref 3.0–12.0)
Neutro Abs: 5.3 10*3/uL (ref 1.4–7.7)
Neutrophils Relative %: 66.5 % (ref 43.0–77.0)
Platelets: 281 10*3/uL (ref 150.0–400.0)
RBC: 3.57 Mil/uL — ABNORMAL LOW (ref 4.22–5.81)
RDW: 17.4 % — ABNORMAL HIGH (ref 11.5–15.5)
WBC: 8 10*3/uL (ref 4.0–10.5)

## 2022-01-04 ENCOUNTER — Encounter: Payer: Self-pay | Admitting: Nurse Practitioner

## 2022-01-04 ENCOUNTER — Other Ambulatory Visit (INDEPENDENT_AMBULATORY_CARE_PROVIDER_SITE_OTHER): Payer: Medicare Other

## 2022-01-04 ENCOUNTER — Ambulatory Visit (INDEPENDENT_AMBULATORY_CARE_PROVIDER_SITE_OTHER): Payer: Medicare Other | Admitting: Nurse Practitioner

## 2022-01-04 VITALS — BP 118/78 | HR 80 | Ht 67.0 in | Wt 160.0 lb

## 2022-01-04 DIAGNOSIS — N183 Chronic kidney disease, stage 3 unspecified: Secondary | ICD-10-CM

## 2022-01-04 DIAGNOSIS — E1122 Type 2 diabetes mellitus with diabetic chronic kidney disease: Secondary | ICD-10-CM

## 2022-01-04 DIAGNOSIS — D509 Iron deficiency anemia, unspecified: Secondary | ICD-10-CM | POA: Diagnosis not present

## 2022-01-04 DIAGNOSIS — K264 Chronic or unspecified duodenal ulcer with hemorrhage: Secondary | ICD-10-CM | POA: Diagnosis not present

## 2022-01-04 DIAGNOSIS — N1832 Chronic kidney disease, stage 3b: Secondary | ICD-10-CM

## 2022-01-04 LAB — COMPREHENSIVE METABOLIC PANEL
ALT: 14 U/L (ref 0–53)
AST: 13 U/L (ref 0–37)
Albumin: 4 g/dL (ref 3.5–5.2)
Alkaline Phosphatase: 90 U/L (ref 39–117)
BUN: 29 mg/dL — ABNORMAL HIGH (ref 6–23)
CO2: 28 mEq/L (ref 19–32)
Calcium: 9.5 mg/dL (ref 8.4–10.5)
Chloride: 101 mEq/L (ref 96–112)
Creatinine, Ser: 1.74 mg/dL — ABNORMAL HIGH (ref 0.40–1.50)
GFR: 37.53 mL/min — ABNORMAL LOW (ref >60.00)
Glucose, Bld: 148 mg/dL — ABNORMAL HIGH (ref 70–99)
Potassium: 4.7 mEq/L (ref 3.5–5.1)
Sodium: 134 mEq/L — ABNORMAL LOW (ref 135–145)
Total Bilirubin: 0.3 mg/dL (ref 0.2–1.2)
Total Protein: 7.1 g/dL (ref 6.0–8.3)

## 2022-01-04 LAB — IBC + FERRITIN
Ferritin: 10.2 ng/mL — ABNORMAL LOW (ref 22.0–322.0)
Iron: 62 ug/dL (ref 42–165)
Saturation Ratios: 16.5 % — ABNORMAL LOW (ref 20.0–50.0)
TIBC: 376.6 ug/dL (ref 250.0–450.0)
Transferrin: 269 mg/dL (ref 212.0–360.0)

## 2022-01-04 LAB — CBC
HCT: 33.8 % — ABNORMAL LOW (ref 39.0–52.0)
Hemoglobin: 11 g/dL — ABNORMAL LOW (ref 13.0–17.0)
MCHC: 32.6 g/dL (ref 30.0–36.0)
MCV: 87.7 fl (ref 78.0–100.0)
Platelets: 286 10*3/uL (ref 150.0–400.0)
RBC: 3.85 Mil/uL — ABNORMAL LOW (ref 4.22–5.81)
RDW: 17.2 % — ABNORMAL HIGH (ref 11.5–15.5)
WBC: 6.9 10*3/uL (ref 4.0–10.5)

## 2022-01-04 MED ORDER — NA SULFATE-K SULFATE-MG SULF 17.5-3.13-1.6 GM/177ML PO SOLN: 1.0000 | Freq: Once | ORAL | 0 refills | Status: AC

## 2022-01-04 NOTE — Progress Notes (Signed)
01/04/2022 Daryl Webb [id number] 11/05/1945   Chief Complaint: Duodenal ulcer, anemia follow up   History of Present Illness: Daryl Webb is a 76 year old male with a past medical history significant hypertension, hyperlipidemia, coronary artery disease, severe aortic stenosis status post TAVR 08/13/2020, diabetes mellitus type 2, CKD stage III, CKD stage III, prostate cancer, inguinal hernia, hyperplastic colon polyp 2012, duodenal ulcer in 2012 with UGI bleed secondary to a duodenal ulcer 11/2021.   He presented to the ED 11/20/2021 due to having SOB and confusion. He reported passing black stools for a few days. On ASA and Diclofenac. Labs showed a Hg level of 7.8 (baseline Hg 13) -> 6.1.  He was transfused 1 unit of PRBCs and he received IV iron.  He was placed on PPI infusion.  He underwent an EGD 11/21/2021 which identified one 10-13m nonbleeding cratered duodenal ulcer and multiple smaller shallower ulcers from the bulb to the second portion of the duodenum.  Gastric biopsies were negative for H. pylori.  His hemoglobin level stabilized at 8.5 without any further melena.  He was discharged home 11/23/2021 on PPI twice daily with plans to repeat an EGD and to consider a colonoscopy in 3 to 4 months.  He had recurrent melena with shortness of breath and he presented back to the ED 11/29/2021.  Hemoglobin 7.1.  He was placed on PPI infusion.  He underwent a repeat EGD 11/30/2021 which showed clotted blood in the gastric body which was cleared and found to be refluxing from active duodenal bulb ulcer with a visible vessel, clips were placed and hemostasis was achieved. He received 3 units of PRBCs during this admission.  His hemoglobin level stabilized at 7.6 and he was discharged home 12/03/2021 on oral iron daily, PPI twice daily and Carafate 3 - 4 times daily.  He was also instructed to remain off aspirin and to avoid all NSAIDs.  He presents today for further evaluation  posthospital discharge and discuss scheduling an EGD and colonoscopy.  Remains on pantoprazole 40 mg p.o. twice daily.  He remains off ASA.  He is not taking any NSAIDs.  No nausea or vomiting.  No dysphagia or heartburn.  No abdominal pain.  He is passing a solid stool daily.  His stools are darker since he started taking oral iron but he denies passing any loose melenic type stools.  No bright red rectal bleeding.  No chest pain, palpitations or shortness of breath.  He sometimes feels briefly lightheaded when he changes positions too quickly.  Appetite is good.  He was recently referred to dietitian to assist with his diabetic nutritional needs.  He is currently scheduled for right inguinal hernia surgery with Dr. KAlvino Blood12/08/2021.  Labs 12/28/2021 showed a hemoglobin level up to 10.3.     Latest Ref Rng & Units 12/28/2021   10:14 AM 12/16/2021   11:23 AM 12/09/2021   10:35 AM  CBC  WBC 4.0 - 10.5 K/uL 8.0  8.4    Hemoglobin 13.0 - 17.0 g/dL 10.3  9.3  8.0   Hematocrit 39.0 - 52.0 % 31.8  29.7    Platelets 150.0 - 400.0 K/uL 281.0  249.0         Latest Ref Rng & Units 12/09/2021   10:39 AM 12/02/2021    5:19 AM 12/01/2021    3:11 AM  CMP  Glucose 70 - 99 mg/dL 293  132  155   BUN 6 - 23 mg/dL 38  32  52   Creatinine 0.40 - 1.50 mg/dL 1.68  1.63  1.57   Sodium 135 - 145 mEq/L 135  139  141   Potassium 3.5 - 5.1 mEq/L 4.1  3.8  3.6   Chloride 96 - 112 mEq/L 104  113  114   CO2 19 - 32 mEq/L '25  22  21   '$ Calcium 8.4 - 10.5 mg/dL 8.8  8.4  8.7   Total Protein 6.5 - 8.1 g/dL  4.9  5.3   Total Bilirubin 0.3 - 1.2 mg/dL  0.8  0.6   Alkaline Phos 38 - 126 U/L  52  45   AST 15 - 41 U/L  48  17   ALT 0 - 44 U/L  57  17     EGD 11/21/2021 by Dr. [name]: - Normal larynx. - Normal esophagus. - Normal larynx. Inpatient - Normal esophagus. - Normal stomach. Biopsied. - Non-bleeding duodenal ulcer with pigmented material. A. STOMACH, BIOPSY:  -  Antral/oxyntic mucosa with mild to moderate  chronic inactive  gastritis.  -An immunohistochemical stain is negative for Helicobacter pylori  organisms.   EGD 11/30/2021 by Dr. Hilarie Fredrickson: - Normal esophagus. - Clotted blood in the gastric body. Cleared and found to be refluxing from active duodenal bulb ulcer bleed. - Mostly healed gastric biopsy sites without bleeding. - Bleeding duodenal ulcers with a visible vessels. Clips (MR conditional) were placed. - Normal second portion of the duodenum. - No specimens collected. - Continue present medications including PPI infusion at least until tomorrow. Once stopped change back to PO BID for 12 weeks then once daily thereafter.  Carafate before every meal and nightly for 4 weeks.  Remain off aspirin and no NSAIDs.  Colonoscopy 03/31/2010: 20m hyperplastic polyp removed from the sigmoid colon. Recall colonoscopy 10 years   EGD 06/28/2012: 3-4 mm clean based to the duodenal bulb Path - chronic duodenitis . No H.pylori  Cardiac procedures:  Pre TAVR cardiac cath 04/28/2020:  widely patent left main, widely patent LAD with ostial stenosis of a small second diagonal branch, otherwise no significant obstruction in the LAD territory. There was a patent left circumflex with mild to moderate nonobstructive plaquing in the mid vessel and patent RCA with mild nonobstructive stenosis, except for a 70% PDA stenosis in a relatively small caliber PDA branch.  Calcific aortic valve with restricted leaflet mobility on plain fluoroscopy.  Peak to peak transaortic gradient 43 mmHg, mean gradient 36 mmHg, calculated aortic valve area 1.17 cm   ECHO 07/01/2021: IMPRESSIONS Left ventricular ejection fraction, by estimation, is 55 to 60%. Left ventricular ejection fraction by 3D volume is 63 %. The left ventricle has normal function. The left ventricle has no regional wall motion abnormalities. There is mild concentric left ventricular hypertrophy. Left ventricular diastolic parameters are consistent with  Grade I diastolic dysfunction (impaired relaxation). 1. Right ventricular systolic function is normal. The right ventricular size is normal. Tricuspid regurgitation signal is inadequate for assessing PA pressure. 2. 3. Left atrial size was moderately dilated. 4. Right atrial size was mildly dilated. 5. The mitral valve is normal in structure. Trivial mitral valve regurgitation. The aortic valve has been repaired/replaced. Aortic valve regurgitation is not visualized. There is a valve present in the aortic position. Echo findings are consistent with normal structure and function of the aortic valve prosthesis. Aortic valve mean gradient measures 9.2 mmHg. Aortic valve Vmax measures 2.07 m/s. Aortic valve acceleration time measures 66 msec.   Current Outpatient Medications  on File Prior to Visit  Medication Sig Dispense Refill   amLODipine (NORVASC) 5 MG tablet Take 1 tablet (5 mg total) by mouth daily. (Patient not taking: Reported on 01/04/2022) 90 tablet 2   amoxicillin (AMOXIL) 500 MG capsule Take 4 capsules (2,000 mg total) by mouth See admin instructions. Take 2000 mg 1 hour prior to dental procedures (Patient not taking: Reported on 01/04/2022) 12 capsule 2   Apoaequorin (PREVAGEN PO) Take 1 capsule by mouth daily. (Patient not taking: Reported on 01/04/2022)     atorvastatin (LIPITOR) 40 MG tablet Take 1 tablet (40 mg total) by mouth daily. (Patient not taking: Reported on 01/04/2022) 90 tablet 3   calcium carbonate (TUMS - DOSED IN MG ELEMENTAL CALCIUM) 500 MG chewable tablet Chew 2 tablets by mouth daily as needed for indigestion or heartburn. (Patient not taking: Reported on 01/04/2022)     Cholecalciferol (VITAMIN D3) 1000 units CAPS Take 2,000 mg by mouth daily. (Patient not taking: Reported on 01/04/2022)     Coenzyme Q10 (COQ10) 100 MG CAPS Take 100 mg by mouth daily. (Patient not taking: Reported on 01/04/2022)     cyanocobalamin (VITAMIN B12) 1000 MCG/ML injection Inject 1,000  mcg into the muscle every 30 (thirty) days. (Patient not taking: Reported on 01/04/2022)     Exenatide ER (BYDUREON BCISE) 2 MG/0.85ML AUIJ Inject 2 mg into the skin once a week. (Patient not taking: Reported on 01/04/2022)     FARXIGA 10 MG TABS tablet Take 10 mg by mouth in the morning. (Patient not taking: Reported on 01/04/2022)     ferrous gluconate (FERGON) 324 MG tablet Take 1 tablet (324 mg total) by mouth daily with breakfast. (Patient not taking: Reported on 01/04/2022) 30 tablet 3   losartan-hydrochlorothiazide (HYZAAR) 50-12.5 MG tablet TAKE 1 TABLET DAILY (Patient not taking: Reported on 01/04/2022) 90 tablet 1   metFORMIN (GLUCOPHAGE) 500 MG tablet Take 500 mg by mouth 2 (two) times daily with a meal. (Patient not taking: Reported on 01/04/2022)     metoprolol succinate (TOPROL-XL) 50 MG 24 hr tablet TAKE 1 TABLET DAILY (Patient not taking: Reported on 01/04/2022) 90 tablet 2   NON FORMULARY Take 2 capsules by mouth See admin instructions. Vicks ZzzQuil Pure Zzzs Triple Action Gummies- Chew 2 gummies by mouth every night at bedtime (Patient not taking: Reported on 01/04/2022)     nystatin ointment (MYCOSTATIN) Apply 1 application topically 3 (three) times daily. (Patient not taking: Reported on 01/04/2022) 30 g 0   pantoprazole (PROTONIX) 40 MG tablet Take 1 tablet (40 mg total) by mouth 2 (two) times daily before a meal. (Patient not taking: Reported on 01/04/2022) 60 tablet 2   sucralfate (CARAFATE) 1 g tablet Take 1 tablet (1 g total) by mouth 3 (three) times daily between meals. 90 tablet 0   TYLENOL 500 MG tablet Take 1,000 mg by mouth every 6 (six) hours as needed for headache. (Patient not taking: Reported on 01/04/2022)     No current facility-administered medications on file prior to visit.   No Known Allergies  Current Medications, Allergies, Past Medical History, Past Surgical History, Family History and Social History were reviewed in [employer]  record.  Review of Systems:   Constitutional: Negative for fever, sweats, chills or weight loss.  Respiratory: Negative for shortness of breath.   Cardiovascular: Negative for chest pain, palpitations and leg swelling.  Gastrointestinal: See HPI.  Musculoskeletal: Negative for back pain or muscle aches.  Neurological: Negative for dizziness, headaches  or paresthesias.   Physical Exam: BP 118/78   Pulse 80   Ht '5\' 7"'$  (1.702 m)   Wt 160 lb (72.6 kg)   SpO2 98%   BMI 25.06 kg/m   General: 76 year old male in no acute distress. Head: Normocephalic and atraumatic. Eyes: No scleral icterus. Conjunctiva pink . Ears: Normal auditory acuity. Mouth: Dentition intact. No ulcers or lesions.  Lungs: Clear throughout to auscultation. Heart: Regular rate and rhythm, no murmur. Abdomen: Soft, nontender and nondistended. No masses or hepatomegaly. Normal bowel sounds x 4 quadrants.  Large right inguinal hernia present. Rectal: Deferred. Musculoskeletal: Symmetrical with no gross deformities. Extremities: No edema. Neurological: Alert oriented x 4. No focal deficits.  Psychological: Alert and cooperative. Normal mood and affect  Assessment and Recommendations:  63) 76 year old male admitted to the hospital 11/20/2021 with UGI bleed/melena and anemia.  Hg level of 7.8 -> 6.1. Transfused 1 unit of PRBCs, received IV iron and PPI infusion.  S/P EGD 11/21/2021 identified one 10-27m nonbleeding cratered duodenal ulcer and multiple smaller shallower ulcers from the bulb to the second portion of the duodenum.  Gastric biopsies were negative for H. pylori.  His Hg level stabilized at 8.5 without any further melena -> discharged home 11/23/2021 on PPI twice daily with plans to repeat an EGD and to consider a colonoscopy in 3 to 4 months. He was readmitted to the hospital with recurrent UGI/bleed and anemia. 11/29/2021.  Hemoglobin 7.1.  He was placed on PPI infusion. Repeat EGD 11/30/2021 showed clotted  blood in the gastric body which was cleared and found to be refluxing from active duodenal bulb ulcer with a visible vessel, clips were placed and hemostasis was achieved. He received 3 units of PRBCs during this admission.  His Hg stabilized at 7.6 and he was discharged home 12/03/2021 on oral iron daily, PPI twice daily and Carafate 3 - 4 times daily.  He was also instructed to remain off aspirin and to avoid all NSAIDs.  No further melena since he was discharged home. -CBC,IBC + Ferritin  -EGD and colonoscopy to be scheduled mid-to-late January 2024 with Dr. MRush Landmark however, if his anemia worsens we may need to expedite his endoscopic evaluation.  -I will consult with Dr. MRush Landmarkregarding the timing of his EGD as I suspect his endoscopic evaluation should be completed prior to proceeding with right inguinal hernia surgery which is currently scheduled 01/14/2022 -Continue oral iron, hold 1 week prior to EGD/colonoscopy prep date then restart after procedures completed -Continue Pantoprazole 40 mg p.o. twice daily -Continue to avoid NSAIDs. Dr. MRush Landmarkto verify if ok to restart ASA.  -Patient to contact our office if he has any melenic stool  2) History of a hyperplastic colon polyp per colonoscopy in 2012 -See plan in # 1  3) Large right inguinal hernia, scheduled for open right inguinal hernia repair with mesh with Dr. KKieth Brightlyon 01/14/2022  4) History of CAD. History of severe aortic stenosis S/P TAVR 08/2020.  5) Elevated LFTs (AST 48, ALT 57) per labs 12/02/2021.  Abdominal MRI 06/06/2020 showed a normal liver. -CMP

## 2022-01-04 NOTE — Patient Instructions (Addendum)
Your provider has requested that you go to the basement level for lab work before leaving today. Press "B" on the elevator. The lab is located at the first door on the left as you exit the elevator.   You have been scheduled for an endoscopy and colonoscopy. Please follow the written instructions given to you at your visit today. Please pick up your prep supplies at the pharmacy within the next 1-3 days. If you use inhalers (even only as needed), please bring them with you on the day of your procedure.   Do not take oral iron 1 week prior to EGD/Colonoscopy.  Continue Protonix 40 mg 1 by mouth twice daily.  Due to recent changes in healthcare laws, you may see the results of your imaging and laboratory studies on MyChart before your provider has had a chance to review them.  We understand that in some cases there may be results that are confusing or concerning to you. Not all laboratory results come back in the same time frame and the provider may be waiting for multiple results in order to interpret others.  Please give [place] 48 hours in order for your provider to thoroughly review all the results before contacting the office for clarification of your results.   Thank you for trusting me with your gastrointestinal care!   Carl Best, CRNP

## 2022-01-05 NOTE — Progress Notes (Signed)
Attending Physician's Attestation   I have reviewed the chart.   I agree with the Advanced Practitioner's note, impression, and recommendations with any updates as below. Based on the patient's repeat hemoglobin increasing in the time since the last endoscopy with treatment, I suspect is likelihood of rebleeding is going to be quite low.  I do recommend repeat endoscopic evaluation as outlined but I do think that it can wait until the new year.  If our surgical colleagues feel that there is a more urgent need to complete the work-up before there planned hernia repair, then we we will work to expedite that.  It would be ideal to have a little bit more time to allow the upper GI tract to heal so I think January procedures as scheduled makes most sense at this time.   Justice Britain, MD [facility] Office # [phone]

## 2022-01-06 DIAGNOSIS — E538 Deficiency of other specified B group vitamins: Secondary | ICD-10-CM | POA: Diagnosis not present

## 2022-01-06 NOTE — Progress Notes (Signed)
COVID Vaccine Completed:yes  Date of COVID positive in last 90 days:  PCP - Betty [name], MD Cardiologist - Lyman Bishop, MD  Chest x-ray - 11/20/21 Epic EKG - 12/02/21 epic Stress Test -  ECHO - 07/01/21 Epic Cardiac Cath - 05/08/20 Epic Pacemaker/ICD device last checked: Spinal Cord Stimulator:  Bowel Prep -   Sleep Study -  CPAP -   Fasting Blood Sugar -  Checks Blood Sugar _____ times a day  Last dose of GLP1 agonist-  N/A GLP1 instructions:  N/A   Last dose of SGLT-2 inhibitors-  N/A SGLT-2 instructions: N/A   Blood Thinner Instructions: Aspirin Instructions: Last Dose:  Activity level:  Can go up a flight of stairs and perform activities of daily living without stopping and without symptoms of chest pain or shortness of breath.  Able to exercise without symptoms  Unable to go up a flight of stairs without symptoms of     Anesthesia review: HTN, atherosclerosis, DM2, CKD, TAVR  Patient denies shortness of breath, fever, cough and chest pain at PAT appointment  Patient verbalized understanding of instructions that were given to them at the PAT appointment. Patient was also instructed that they will need to review over the PAT instructions again at home before surgery.

## 2022-01-06 NOTE — Patient Instructions (Signed)
SURGICAL WAITING ROOM VISITATION Patients having surgery or a procedure may have no more than 2 support people in the waiting area - these visitors may rotate.   Children under the age of 29 must have an adult with them who is not the patient. If the patient needs to stay at the hospital during part of their recovery, the visitor guidelines for inpatient rooms apply. Pre-op nurse will coordinate an appropriate time for 1 support person to accompany patient in pre-op.  This support person may not rotate.    Please refer to the [facility] website for the visitor guidelines for Inpatients (after your surgery is over and you are in a regular room).       Your procedure is scheduled on: 01/14/22   Report to [facility] Main Entrance    Report to admitting at 5:15 AM   Call this number if you have problems the morning of surgery [phone]   Do not eat food :After Midnight.   After Midnight you may have the following liquids until 4:30 AM DAY OF SURGERY  Water Non-Citrus Juices (without pulp, NO RED) Carbonated Beverages Black Coffee (NO MILK/CREAM OR CREAMERS, sugar ok)  Clear Tea (NO MILK/CREAM OR CREAMERS, sugar ok) regular and decaf                             Plain Jell-O (NO RED)                                           Fruit ices (not with fruit pulp, NO RED)                                     Popsicles (NO RED)                                                               Sports drinks like Gatorade (NO RED)          If you have questions, please contact your surgeon's office.   FOLLOW BOWEL PREP AND ANY ADDITIONAL PRE OP INSTRUCTIONS YOU RECEIVED FROM YOUR SURGEON'S OFFICE!!!     Oral Hygiene is also important to reduce your risk of infection.                                    Remember - BRUSH YOUR TEETH THE MORNING OF SURGERY WITH YOUR REGULAR TOOTHPASTE   Take these medicines the morning of surgery with A SIP OF WATER: Protonix, Amlodipine, Metoprolol,  Tylenol   DO NOT TAKE ANY ORAL DIABETIC MEDICATIONS DAY OF YOUR SURGERY  How to Manage Your Diabetes Before and After Surgery  Why is it important to control my blood sugar before and after surgery? Improving blood sugar levels before and after surgery helps healing and can limit problems. A way of improving blood sugar control is eating a healthy diet by:  Eating less sugar and carbohydrates  Increasing activity/exercise  Talking with your doctor about  reaching your blood sugar goals High blood sugars (greater than 180 mg/dL) can raise your risk of infections and slow your recovery, so you will need to focus on controlling your diabetes during the weeks before surgery. Make sure that the doctor who takes care of your diabetes knows about your planned surgery including the date and location.  How do I manage my blood sugar before surgery? Check your blood sugar at least 4 times a day, starting 2 days before surgery, to make sure that the level is not too high or low. Check your blood sugar the morning of your surgery when you wake up and every 2 hours until you get to the Short Stay unit. If your blood sugar is less than 70 mg/dL, you will need to treat for low blood sugar: Do not take insulin. Treat a low blood sugar (less than 70 mg/dL) with  cup of clear juice (cranberry or apple), 4 glucose tablets, OR glucose gel. Recheck blood sugar in 15 minutes after treatment (to make sure it is greater than 70 mg/dL). If your blood sugar is not greater than 70 mg/dL on recheck, call [phone] for further instructions. Report your blood sugar to the short stay nurse when you get to Short Stay.  If you are admitted to the hospital after surgery: Your blood sugar will be checked by the staff and you will probably be given insulin after surgery (instead of oral diabetes medicines) to make sure you have good blood sugar levels. The goal for blood sugar control after surgery is 80-180  mg/dL.   WHAT DO I DO ABOUT MY DIABETES MEDICATION?  Do not take oral diabetes medicines (pills) the morning of surgery.  Hold Farxiga 3 days before surgery. Last dose 01/11/22. May continue after surgery.  Do not take Trulicity 97/6/73.  THE DAY BEFORE SURGERY, take Metformin as prescribed.  THE MORNING OF SURGERY, do not take Metformin   DO NOT TAKE THE FOLLOWING 7 DAYS PRIOR TO SURGERY: Ozempic, Wegovy, Rybelsus (Semaglutide), Byetta (exenatide), Bydureon (exenatide ER), Victoza, Saxenda (liraglutide), or Trulicity (dulaglutide) Mounjaro (Tirzepatide) Adlyxin (Lixisenatide), Polyethylene Glycol Loxenatide.  Reviewed and Endorsed by [facility] Patient Education Committee, August 2015                              You may not have any metal on your body including jewelry, and body piercing             Do not wear lotions, powders, cologne, or deodorant              Men may shave face and neck.   Do not bring valuables to the hospital. [place].   Bring small overnight bag day of surgery.   DO NOT [place]. PHARMACY WILL DISPENSE MEDICATIONS LISTED ON YOUR MEDICATION LIST TO YOU DURING YOUR ADMISSION [place]!    Patients discharged on the day of surgery will not be allowed to drive home.  Someone NEEDS to stay with you for the first 24 hours after anesthesia.   Special Instructions: Bring a copy of your healthcare power of attorney and living will documents the day of surgery if you haven't scanned them before.              Please read over the following  fact sheets you were given: IF YOU HAVE QUESTIONS ABOUT YOUR PRE-OP INSTRUCTIONS PLEASE CALL [phone]   If you received a COVID test during your pre-op visit  it is requested that you wear a mask when out in public, stay away from anyone that may not be feeling well and notify your surgeon if you develop symptoms. If you test  positive for Covid or have been in contact with anyone that has tested positive in the last 10 days please notify you surgeon.    [place] - Preparing for Surgery Before surgery, you can play an important role.  Because skin is not sterile, your skin needs to be as free of germs as possible.  You can reduce the number of germs on your skin by washing with CHG (chlorahexidine gluconate) soap before surgery.  CHG is an antiseptic cleaner which kills germs and bonds with the skin to continue killing germs even after washing. Please DO NOT use if you have an allergy to CHG or antibacterial soaps.  If your skin becomes reddened/irritated stop using the CHG and inform your nurse when you arrive at Short Stay. Do not shave (including legs and underarms) for at least 48 hours prior to the first CHG shower.  You may shave your face/neck.  Please follow these instructions carefully:  1.  Shower with CHG Soap the night before surgery and the  morning of surgery.  2.  If you choose to wash your hair, wash your hair first as usual with your normal  shampoo.  3.  After you shampoo, rinse your hair and body thoroughly to remove the shampoo.                             4.  Use CHG as you would any other liquid soap.  You can apply chg directly to the skin and wash.  Gently with a scrungie or clean washcloth.  5.  Apply the CHG Soap to your body ONLY FROM THE NECK DOWN.   Do   not use on face/ open                           Wound or open sores. Avoid contact with eyes, ears mouth and   genitals (private parts).                       Wash face,  Genitals (private parts) with your normal soap.             6.  Wash thoroughly, paying special attention to the area where your    surgery  will be performed.  7.  Thoroughly rinse your body with warm water from the neck down.  8.  DO NOT shower/wash with your normal soap after using and rinsing off the CHG Soap.                9.  Pat yourself dry with a clean towel.             10.  Wear clean pajamas.            11.  Place clean sheets on your bed the night of your first shower and do not  sleep with pets. Day of Surgery : Do not apply any lotions/deodorants the morning of surgery.  Please wear clean clothes to the hospital/surgery center.  FAILURE TO FOLLOW THESE  INSTRUCTIONS MAY RESULT IN THE CANCELLATION OF YOUR SURGERY  PATIENT SIGNATURE_________________________________  NURSE SIGNATURE__________________________________  ________________________________________________________________________

## 2022-01-07 ENCOUNTER — Encounter (HOSPITAL_COMMUNITY)
Admission: RE | Admit: 2022-01-07 | Discharge: 2022-01-07 | Disposition: A | Discharge status: Home / Self Care | Payer: Medicare Other | Source: Ambulatory Visit | Attending: General Surgery | Admitting: General Surgery

## 2022-01-07 ENCOUNTER — Encounter (HOSPITAL_COMMUNITY): Payer: Self-pay

## 2022-01-07 DIAGNOSIS — I129 Hypertensive chronic kidney disease with stage 1 through stage 4 chronic kidney disease, or unspecified chronic kidney disease: Secondary | ICD-10-CM | POA: Diagnosis not present

## 2022-01-07 DIAGNOSIS — K4090 Unilateral inguinal hernia, without obstruction or gangrene, not specified as recurrent: Secondary | ICD-10-CM | POA: Insufficient documentation

## 2022-01-07 DIAGNOSIS — Z87891 Personal history of nicotine dependence: Secondary | ICD-10-CM | POA: Insufficient documentation

## 2022-01-07 DIAGNOSIS — Z953 Presence of xenogenic heart valve: Secondary | ICD-10-CM | POA: Insufficient documentation

## 2022-01-07 DIAGNOSIS — N1832 Chronic kidney disease, stage 3b: Secondary | ICD-10-CM | POA: Insufficient documentation

## 2022-01-07 DIAGNOSIS — Z01812 Encounter for preprocedural laboratory examination: Secondary | ICD-10-CM | POA: Insufficient documentation

## 2022-01-07 DIAGNOSIS — E1122 Type 2 diabetes mellitus with diabetic chronic kidney disease: Secondary | ICD-10-CM | POA: Insufficient documentation

## 2022-01-07 HISTORY — DX: Nausea with vomiting, unspecified: R11.2

## 2022-01-07 HISTORY — DX: Other specified postprocedural states: Z98.890

## 2022-01-07 LAB — BASIC METABOLIC PANEL
Anion gap: 7 (ref 5–15)
BUN: 35 mg/dL — ABNORMAL HIGH (ref 8–23)
CO2: 28 mmol/L (ref 22–32)
Calcium: 9.4 mg/dL (ref 8.9–10.3)
Chloride: 104 mmol/L (ref 98–111)
Creatinine, Ser: 1.9 mg/dL — ABNORMAL HIGH (ref 0.61–1.24)
GFR, Estimated: 36 mL/min — ABNORMAL LOW (ref >60)
Glucose, Bld: 169 mg/dL — ABNORMAL HIGH (ref 70–99)
Potassium: 4.6 mmol/L (ref 3.5–5.1)
Sodium: 139 mmol/L (ref 135–145)

## 2022-01-07 LAB — CBC
HCT: 36 % — ABNORMAL LOW (ref 39.0–52.0)
Hemoglobin: 10.8 g/dL — ABNORMAL LOW (ref 13.0–17.0)
MCH: 27.3 pg (ref 26.0–34.0)
MCHC: 30 g/dL (ref 30.0–36.0)
MCV: 91.1 fL (ref 80.0–100.0)
Platelets: 266 10*3/uL (ref 150–400)
RBC: 3.95 MIL/uL — ABNORMAL LOW (ref 4.22–5.81)
RDW: 15.7 % — ABNORMAL HIGH (ref 11.5–15.5)
WBC: 7.6 10*3/uL (ref 4.0–10.5)
nRBC: 0 % (ref 0.0–0.2)

## 2022-01-07 LAB — GLUCOSE, CAPILLARY: Glucose-Capillary: 158 mg/dL — ABNORMAL HIGH (ref 70–99)

## 2022-01-07 NOTE — Progress Notes (Signed)
Creatinine 1.90, results routed to Dr. Kieth Brightly

## 2022-01-08 NOTE — Progress Notes (Signed)
Anesthesia Chart Review   Case: [id number] Date/Time: 01/14/22 0715   Procedure: RIGHT OPEN INGUINAL HERNIA REPAIR WITH MESH (Right)   Anesthesia type: General   Pre-op diagnosis: RIGHT INGUINAL HERNIA   Location: [place] [facility] / [facility]   Surgeons: Kinsinger, Arta Bruce, MD       DISCUSSION:76 y.o. former smoker with h/o HTN, DM II, CKD III, s/p TAVR 08/12/2020, right inguinal hernia scheduled for above procedure 01/14/2022 with Dr. Gurney Maxin.   Last seen by PCP 12/09/2021, stable at this visit.   Pt seen by cardiology 09/14/2021. Per OV note, "Preoperative Cardiovascular Risk Assessment: The patient is doing well from a cardiac perspective. Therefore, based on ACC/AHA guidelines, the patient would be at acceptable risk for the planned procedure without further cardiovascular testing. The patient was advised that if he develops new symptoms prior to surgery to contact our office to arrange for a follow-up visit, and he verbalized understanding. According to the Revised Cardiac Risk Index (RCRI), his Perioperative Risk of Major Cardiac Event is (%): 0.4. His Functional Capacity in METs is: 6.61 according to the Duke Activity Status Index (DASI). He may hold aspirin x 7 days"  Case previously delayed due to admission for GI bleed, anemia.  He was last seen by GI 01/04/2022. No further melena since discharge. Hemoglobin 10.8.  VS: BP 126/78   Pulse 83   Temp 36.7 C (Oral)   Resp 16   Ht '5\' 7"'$  (1.702 m)   Wt 69.9 kg   SpO2 98%   BMI 24.12 kg/m   PROVIDERS: [name], [name], MD is PCP   Cardiologist - Lyman Bishop, MD  LABS: Labs reviewed: Acceptable for surgery. (all labs ordered are listed, but only abnormal results are displayed)  Labs Reviewed  BASIC METABOLIC PANEL - Abnormal; Notable for the following components:      Result Value   Glucose, Bld 169 (*)    BUN 35 (*)    Creatinine, Ser 1.90 (*)    GFR, Estimated 36 (*)    All other components within normal limits  CBC -  Abnormal; Notable for the following components:   RBC 3.95 (*)    Hemoglobin 10.8 (*)    HCT 36.0 (*)    RDW 15.7 (*)    All other components within normal limits  GLUCOSE, CAPILLARY - Abnormal; Notable for the following components:   Glucose-Capillary 158 (*)    All other components within normal limits     IMAGES:   EKG:   CV: Echo 07/01/2021  1. Left ventricular ejection fraction, by estimation, is 55 to 60%. Left  ventricular ejection fraction by 3D volume is 63 %. The left ventricle has  normal function. The left ventricle has no regional wall motion  abnormalities. There is mild concentric  left ventricular hypertrophy. Left ventricular diastolic parameters are  consistent with Grade I diastolic dysfunction (impaired relaxation).   2. Right ventricular systolic function is normal. The right ventricular  size is normal. Tricuspid regurgitation signal is inadequate for assessing  PA pressure.   3. Left atrial size was moderately dilated.   4. Right atrial size was mildly dilated.   5. The mitral valve is normal in structure. Trivial mitral valve  regurgitation.   6. The aortic valve has been repaired/replaced. Aortic valve  regurgitation is not visualized. There is a valve present in the aortic  position. Echo findings are consistent with normal structure and function  of the aortic valve prosthesis. Aortic valve  mean gradient measures 9.2 mmHg. Aortic valve Vmax measures 2.07 m/s.  Aortic valve acceleration time measures 66 msec.    Myocardial Perfusion 04/18/2019 The left ventricular ejection fraction is moderately decreased (30-44%). Nuclear stress EF is calculated at 41% but appears higher at around 50-55%. Baseline EKG showed T wave inversions in the inferolateral leads with no change from baseline EKG during infusion. The perfusion study is normal. This is a low risk study. Past Medical History:  Diagnosis Date   Acute blood loss anemia 06/27/2012   Anemia     Arthritis    CKD (chronic kidney disease)    Constipation due to pain medication    needs stool softner while on pain medication   Diabetes mellitus    Duodenal ulcer 08/13/2015   2014  - Dr Kaplan/GI   Duodenal ulcer with hemorrhage 08/13/2015   2014  - Dr Kaplan/GI   Dyspnea 06/19/2012   GERD (gastroesophageal reflux disease)    H/O: osteoarthritis    Heart murmur    History of kidney stones    History of prostate cancer    History of urethral stricture    Hypercholesterolemia    Hypertension    Melena 06/19/2012   PONV (postoperative nausea and vomiting)    Prostate cancer ([place])    prostate   S/P TAVR (transcatheter aortic valve replacement) 08/12/2020   s/p TAVR with a 29 mm Edwards Sapien 3 THV via the TF approach by [name] and [name]   Severe aortic stenosis    Spinal stenosis    Ulcer duodenal hemorrhage     Past Surgical History:  Procedure Laterality Date   ARTHROPLASTY  2004   right knee [name] SURGERY     BIOPSY  11/21/2021   Procedure: BIOPSY;  Surgeon: Doran Stabler, MD;  Location: [facility];  Service: Gastroenterology;;   CARDIAC CATHETERIZATION     CARPAL TUNNEL RELEASE     right hand,wrist [name]   CYSTOSCOPY N/A 09/20/2012   Procedure: Consuela Mimes;  Surgeon: [name], MD;  Location: [facility];  Service: Urology;  Laterality: N/A;   ESOPHAGOGASTRODUODENOSCOPY N/A 11/21/2021   Procedure: ESOPHAGOGASTRODUODENOSCOPY (EGD);  Surgeon: Doran Stabler, MD;  Location: [facility];  Service: Gastroenterology;  Laterality: N/A;   ESOPHAGOGASTRODUODENOSCOPY (EGD) WITH PROPOFOL N/A 11/30/2021   Procedure: ESOPHAGOGASTRODUODENOSCOPY (EGD) WITH PROPOFOL;  Surgeon: Jerene Bears, MD;  Location: [facility];  Service: Gastroenterology;  Laterality: N/A;   HEMOSTASIS CLIP PLACEMENT  11/30/2021   Procedure: HEMOSTASIS CLIP PLACEMENT;  Surgeon: Jerene Bears, MD;  Location: [facility];  Service: Gastroenterology;;   JOINT  REPLACEMENT Right    KNEE ARTHROSCOPY Right    LUMBAR LAMINECTOMY  02/08/2013   PROSTATECTOMY     radical   RIGHT/LEFT HEART CATH AND CORONARY ANGIOGRAPHY N/A 05/08/2020   Procedure: RIGHT/LEFT HEART CATH AND CORONARY ANGIOGRAPHY;  Surgeon: [name], MD;  Location: [place] CV LAB;  Service: Cardiovascular;  Laterality: N/A;   TEE WITHOUT CARDIOVERSION N/A 08/12/2020   Procedure: TRANSESOPHAGEAL ECHOCARDIOGRAM (TEE);  Surgeon: [name], MD;  Location: [place] CV LAB;  Service: Open Heart Surgery;  Laterality: N/A;   TOOTH EXTRACTION  12/2021   TRANSCATHETER AORTIC VALVE REPLACEMENT, TRANSFEMORAL N/A 08/12/2020   Procedure: TRANSCATHETER AORTIC VALVE REPLACEMENT, TRANSFEMORAL;  Surgeon: [name], MD;  Location: [place] CV LAB;  Service: Open Heart Surgery;  Laterality: N/A;   VASECTOMY  1985    MEDICATIONS:  amLODipine (NORVASC) 5  MG tablet   amoxicillin (AMOXIL) 500 MG capsule   atorvastatin (LIPITOR) 40 MG tablet   calcium carbonate (TUMS - DOSED IN MG ELEMENTAL CALCIUM) 500 MG chewable tablet   Cholecalciferol (VITAMIN D3) 1000 units CAPS   Coenzyme Q10 (COQ10) 100 MG CAPS   cyanocobalamin (VITAMIN B12) 1000 MCG/ML injection   Dulaglutide (TRULICITY) 9.62 EZ/6.6QH SOPN   Exenatide ER (BYDUREON BCISE) 2 MG/0.85ML AUIJ   FARXIGA 10 MG TABS tablet   ferrous gluconate (FERGON) 324 MG tablet   losartan-hydrochlorothiazide (HYZAAR) 50-12.5 MG tablet   metFORMIN (GLUCOPHAGE) 500 MG tablet   metoprolol succinate (TOPROL-XL) 50 MG 24 hr tablet   NON FORMULARY   pantoprazole (PROTONIX) 40 MG tablet   TYLENOL 500 MG tablet   No current facility-administered medications for this encounter.     [name], PA-C [facility] Pre-Surgical Testing [phone]

## 2022-01-12 DIAGNOSIS — N1832 Chronic kidney disease, stage 3b: Secondary | ICD-10-CM | POA: Diagnosis not present

## 2022-01-12 DIAGNOSIS — E1122 Type 2 diabetes mellitus with diabetic chronic kidney disease: Secondary | ICD-10-CM | POA: Diagnosis not present

## 2022-01-12 DIAGNOSIS — E538 Deficiency of other specified B group vitamins: Secondary | ICD-10-CM | POA: Diagnosis not present

## 2022-01-13 ENCOUNTER — Telehealth: Payer: Self-pay | Admitting: Family Medicine

## 2022-01-13 DIAGNOSIS — R413 Other amnesia: Secondary | ICD-10-CM

## 2022-01-13 NOTE — Anesthesia Preprocedure Evaluation (Signed)
Anesthesia Evaluation  Patient identified by MRN, date of birth, ID band Patient awake    Reviewed: Allergy & Precautions, H&P , NPO status , Patient's Chart, lab work & pertinent test results  History of Anesthesia Complications (+) PONV and history of anesthetic complications  Airway Mallampati: II  TM Distance: >3 FB Neck ROM: Full    Dental no notable dental hx. (+) Teeth Intact, Dental Advisory Given   Pulmonary former smoker   Pulmonary exam normal breath sounds clear to auscultation       Cardiovascular hypertension, Pt. on medications and Pt. on home beta blockers  Rhythm:Regular Rate:Normal  S/p TAVR   Neuro/Psych negative neurological ROS  negative psych ROS   GI/Hepatic Neg liver ROS, PUD,GERD  Medicated,,  Endo/Other  diabetes, Type 2, Oral Hypoglycemic Agents    Renal/GU Renal InsufficiencyRenal disease  negative genitourinary   Musculoskeletal  (+) Arthritis , Osteoarthritis,    Abdominal   Peds  Hematology  (+) Blood dyscrasia, anemia   Anesthesia Other Findings   Reproductive/Obstetrics negative OB ROS                             Anesthesia Physical Anesthesia Plan  ASA: 3  Anesthesia Plan: General   Post-op Pain Management: Regional block* and Tylenol PO (pre-op)*   Induction: Intravenous  PONV Risk Score and Plan: 4 or greater and Ondansetron, Dexamethasone and Treatment may vary due to age or medical condition  Airway Management Planned: Oral ETT  Additional Equipment:   Intra-op Plan:   Post-operative Plan: Extubation in OR  Informed Consent: I have reviewed the patients History and Physical, chart, labs and discussed the procedure including the risks, benefits and alternatives for the proposed anesthesia with the patient or authorized representative who has indicated his/her understanding and acceptance.     Dental advisory given  Plan Discussed  with: CRNA  Anesthesia Plan Comments:        Anesthesia Quick Evaluation

## 2022-01-13 NOTE — Telephone Encounter (Signed)
Okay to refer? (Neurology?)

## 2022-01-13 NOTE — Telephone Encounter (Signed)
Spouse called to say Pt is experiencing severe short term memory issues.  Spouse would like Pt to be tested.  Spouse is requesting a referral.     LOV:  12/09/21

## 2022-01-14 ENCOUNTER — Other Ambulatory Visit: Payer: Self-pay

## 2022-01-14 ENCOUNTER — Encounter (HOSPITAL_COMMUNITY)
Admission: RE | Disposition: A | Discharge status: Home / Self Care | Payer: Self-pay | Source: Home / Self Care | Attending: General Surgery

## 2022-01-14 ENCOUNTER — Ambulatory Visit (HOSPITAL_COMMUNITY)
Admission: RE | Admit: 2022-01-14 | Discharge: 2022-01-14 | Disposition: A | Discharge status: Home / Self Care | Payer: Medicare Other | Attending: General Surgery | Admitting: General Surgery

## 2022-01-14 ENCOUNTER — Encounter (HOSPITAL_COMMUNITY): Payer: Self-pay | Admitting: General Surgery

## 2022-01-14 ENCOUNTER — Ambulatory Visit (HOSPITAL_COMMUNITY): Payer: Medicare Other | Admitting: Physician Assistant

## 2022-01-14 ENCOUNTER — Ambulatory Visit (HOSPITAL_BASED_OUTPATIENT_CLINIC_OR_DEPARTMENT_OTHER): Payer: Medicare Other | Admitting: Anesthesiology

## 2022-01-14 DIAGNOSIS — Z7984 Long term (current) use of oral hypoglycemic drugs: Secondary | ICD-10-CM | POA: Diagnosis not present

## 2022-01-14 DIAGNOSIS — K219 Gastro-esophageal reflux disease without esophagitis: Secondary | ICD-10-CM | POA: Insufficient documentation

## 2022-01-14 DIAGNOSIS — N189 Chronic kidney disease, unspecified: Secondary | ICD-10-CM | POA: Diagnosis not present

## 2022-01-14 DIAGNOSIS — K4090 Unilateral inguinal hernia, without obstruction or gangrene, not specified as recurrent: Secondary | ICD-10-CM | POA: Diagnosis not present

## 2022-01-14 DIAGNOSIS — I129 Hypertensive chronic kidney disease with stage 1 through stage 4 chronic kidney disease, or unspecified chronic kidney disease: Secondary | ICD-10-CM | POA: Diagnosis not present

## 2022-01-14 DIAGNOSIS — Z7985 Long-term (current) use of injectable non-insulin antidiabetic drugs: Secondary | ICD-10-CM | POA: Diagnosis not present

## 2022-01-14 DIAGNOSIS — E119 Type 2 diabetes mellitus without complications: Secondary | ICD-10-CM

## 2022-01-14 DIAGNOSIS — I10 Essential (primary) hypertension: Secondary | ICD-10-CM | POA: Diagnosis not present

## 2022-01-14 DIAGNOSIS — Z87891 Personal history of nicotine dependence: Secondary | ICD-10-CM

## 2022-01-14 DIAGNOSIS — D649 Anemia, unspecified: Secondary | ICD-10-CM | POA: Diagnosis not present

## 2022-01-14 DIAGNOSIS — Z952 Presence of prosthetic heart valve: Secondary | ICD-10-CM | POA: Insufficient documentation

## 2022-01-14 DIAGNOSIS — G8918 Other acute postprocedural pain: Secondary | ICD-10-CM | POA: Diagnosis not present

## 2022-01-14 DIAGNOSIS — Z79899 Other long term (current) drug therapy: Secondary | ICD-10-CM | POA: Diagnosis not present

## 2022-01-14 DIAGNOSIS — N1832 Chronic kidney disease, stage 3b: Secondary | ICD-10-CM

## 2022-01-14 HISTORY — PX: INGUINAL HERNIA REPAIR: SHX194

## 2022-01-14 LAB — GLUCOSE, CAPILLARY
Glucose-Capillary: 141 mg/dL — ABNORMAL HIGH (ref 70–99)
Glucose-Capillary: 161 mg/dL — ABNORMAL HIGH (ref 70–99)

## 2022-01-14 SURGERY — REPAIR, HERNIA, INGUINAL, ADULT
Anesthesia: General | Laterality: Right

## 2022-01-14 MED ORDER — ONDANSETRON HCL 4 MG/2ML IJ SOLN
INTRAMUSCULAR | Status: AC
  Filled 2022-01-14: qty 2

## 2022-01-14 MED ORDER — BUPIVACAINE HCL 0.5 % IJ SOLN
INTRAMUSCULAR | Status: DC | PRN
  Administered 2022-01-14: 10 mL

## 2022-01-14 MED ORDER — FENTANYL CITRATE (PF) 100 MCG/2ML IJ SOLN
INTRAMUSCULAR | Status: AC
  Filled 2022-01-14: qty 2

## 2022-01-14 MED ORDER — BUPIVACAINE-EPINEPHRINE (PF) 0.5% -1:200000 IJ SOLN
INTRAMUSCULAR | Status: DC | PRN
  Administered 2022-01-14: 20 mL via PERINEURAL

## 2022-01-14 MED ORDER — LIDOCAINE HCL (PF) 2 % IJ SOLN
INTRAMUSCULAR | Status: AC
  Filled 2022-01-14: qty 5

## 2022-01-14 MED ORDER — 0.9 % SODIUM CHLORIDE (POUR BTL) OPTIME
TOPICAL | Status: DC | PRN
  Administered 2022-01-14: 1000 mL

## 2022-01-14 MED ORDER — CHLORHEXIDINE GLUCONATE 0.12 % MT SOLN
15.0000 mL | Freq: Once | OROMUCOSAL | Status: AC
  Administered 2022-01-14: 15 mL via OROMUCOSAL

## 2022-01-14 MED ORDER — PHENYLEPHRINE 80 MCG/ML (10ML) SYRINGE FOR IV PUSH (FOR BLOOD PRESSURE SUPPORT)
PREFILLED_SYRINGE | INTRAVENOUS | Status: DC | PRN
  Administered 2022-01-14: 160 ug via INTRAVENOUS
  Administered 2022-01-14: 80 ug via INTRAVENOUS
  Administered 2022-01-14 (×2): 160 ug via INTRAVENOUS

## 2022-01-14 MED ORDER — LACTATED RINGERS IV SOLN: INTRAVENOUS | Status: DC

## 2022-01-14 MED ORDER — FENTANYL CITRATE PF 50 MCG/ML IJ SOSY
PREFILLED_SYRINGE | INTRAMUSCULAR | Status: AC
  Filled 2022-01-14: qty 1

## 2022-01-14 MED ORDER — FENTANYL CITRATE PF 50 MCG/ML IJ SOSY
25.0000 ug | PREFILLED_SYRINGE | INTRAMUSCULAR | Status: DC | PRN
  Administered 2022-01-14: 50 ug via INTRAVENOUS

## 2022-01-14 MED ORDER — SUGAMMADEX SODIUM 200 MG/2ML IV SOLN
INTRAVENOUS | Status: DC | PRN
  Administered 2022-01-14: 150 mg via INTRAVENOUS

## 2022-01-14 MED ORDER — PROPOFOL 10 MG/ML IV BOLUS
INTRAVENOUS | Status: AC
  Filled 2022-01-14: qty 20

## 2022-01-14 MED ORDER — VITAMIN D3 1000 UNITS PO CAPS: 2000.0000 [IU] | ORAL_CAPSULE | Freq: Every day | ORAL | 0 refills | Status: AC

## 2022-01-14 MED ORDER — CHLORHEXIDINE GLUCONATE CLOTH 2 % EX PADS: 6.0000 | MEDICATED_PAD | Freq: Once | CUTANEOUS | Status: DC

## 2022-01-14 MED ORDER — DEXAMETHASONE SODIUM PHOSPHATE 10 MG/ML IJ SOLN
INTRAMUSCULAR | Status: DC | PRN
  Administered 2022-01-14: 8 mg via INTRAVENOUS

## 2022-01-14 MED ORDER — ONDANSETRON HCL 4 MG/2ML IJ SOLN
INTRAMUSCULAR | Status: DC | PRN
  Administered 2022-01-14: 4 mg via INTRAVENOUS

## 2022-01-14 MED ORDER — PROPOFOL 10 MG/ML IV BOLUS
INTRAVENOUS | Status: DC | PRN
  Administered 2022-01-14: 130 mg via INTRAVENOUS

## 2022-01-14 MED ORDER — FENTANYL CITRATE (PF) 100 MCG/2ML IJ SOLN
INTRAMUSCULAR | Status: DC | PRN
  Administered 2022-01-14 (×3): 50 ug via INTRAVENOUS

## 2022-01-14 MED ORDER — ROCURONIUM BROMIDE 10 MG/ML (PF) SYRINGE
PREFILLED_SYRINGE | INTRAVENOUS | Status: AC
  Filled 2022-01-14: qty 10

## 2022-01-14 MED ORDER — ROCURONIUM BROMIDE 10 MG/ML (PF) SYRINGE
PREFILLED_SYRINGE | INTRAVENOUS | Status: DC | PRN
  Administered 2022-01-14: 50 mg via INTRAVENOUS

## 2022-01-14 MED ORDER — OXYCODONE HCL 5 MG PO TABS: 5.0000 mg | ORAL_TABLET | Freq: Four times a day (QID) | ORAL | 0 refills | Status: DC | PRN

## 2022-01-14 MED ORDER — BUPIVACAINE LIPOSOME 1.3 % IJ SUSP
INTRAMUSCULAR | Status: DC | PRN
  Administered 2022-01-14: 10 mL via PERINEURAL

## 2022-01-14 MED ORDER — LIDOCAINE 2% (20 MG/ML) 5 ML SYRINGE
INTRAMUSCULAR | Status: DC | PRN
  Administered 2022-01-14: 60 mg via INTRAVENOUS

## 2022-01-14 MED ORDER — BUPIVACAINE LIPOSOME 1.3 % IJ SUSP
INTRAMUSCULAR | Status: AC
  Filled 2022-01-14: qty 20

## 2022-01-14 MED ORDER — DEXAMETHASONE SODIUM PHOSPHATE 10 MG/ML IJ SOLN
INTRAMUSCULAR | Status: AC
  Filled 2022-01-14: qty 1

## 2022-01-14 MED ORDER — BUPIVACAINE HCL (PF) 0.5 % IJ SOLN
INTRAMUSCULAR | Status: AC
  Filled 2022-01-14: qty 30

## 2022-01-14 MED ORDER — CEFAZOLIN SODIUM-DEXTROSE 2-4 GM/100ML-% IV SOLN
2.0000 g | INTRAVENOUS | Status: AC
  Administered 2022-01-14: 2 g via INTRAVENOUS
  Filled 2022-01-14: qty 100

## 2022-01-14 MED ORDER — ORAL CARE MOUTH RINSE: 15.0000 mL | Freq: Once | OROMUCOSAL | Status: AC

## 2022-01-14 MED ORDER — ACETAMINOPHEN 500 MG PO TABS
1000.0000 mg | ORAL_TABLET | Freq: Once | ORAL | Status: AC
  Administered 2022-01-14: 1000 mg via ORAL
  Filled 2022-01-14: qty 2

## 2022-01-14 MED ORDER — ACETAMINOPHEN 500 MG PO TABS: 1000.0000 mg | ORAL_TABLET | ORAL | Status: DC

## 2022-01-14 SURGICAL SUPPLY — 44 items
ADH SKN CLS APL DERMABOND .7 (GAUZE/BANDAGES/DRESSINGS) ×1
APL PRP STRL LF DISP 70% ISPRP (MISCELLANEOUS) ×1
APL SKNCLS STERI-STRIP NONHPOA (GAUZE/BANDAGES/DRESSINGS)
BAG COUNTER SPONGE SURGICOUNT (BAG) ×2 IMPLANT
BAG SPNG CNTER NS LX DISP (BAG) ×1
BENZOIN TINCTURE PRP APPL 2/3 (GAUZE/BANDAGES/DRESSINGS) IMPLANT
BLADE SURG 15 STRL LF DISP TIS (BLADE) ×2 IMPLANT
BLADE SURG 15 STRL SS (BLADE) ×1
CHLORAPREP W/TINT 26 (MISCELLANEOUS) ×2 IMPLANT
COVER SURGICAL LIGHT HANDLE (MISCELLANEOUS) ×2 IMPLANT
DERMABOND ADVANCED .7 DNX12 (GAUZE/BANDAGES/DRESSINGS) ×2 IMPLANT
DRAIN PENROSE 0.5X18 (DRAIN) IMPLANT
DRAPE LAPAROTOMY TRNSV 102X78 (DRAPES) ×2 IMPLANT
DRAPE UTILITY XL STRL (DRAPES) ×2 IMPLANT
DRSG TELFA PLUS 4X6 ADH ISLAND (GAUZE/BANDAGES/DRESSINGS) IMPLANT
ELECT REM PT RETURN 15FT ADLT (MISCELLANEOUS) ×2 IMPLANT
GAUZE SPONGE 4X4 12PLY STRL (GAUZE/BANDAGES/DRESSINGS) IMPLANT
GLOVE BIOGEL PI IND STRL 7.0 (GLOVE) IMPLANT
GLOVE SURG SS PI 7.0 STRL IVOR (GLOVE) ×2 IMPLANT
GOWN STRL REUS W/ TWL LRG LVL3 (GOWN DISPOSABLE) ×2 IMPLANT
GOWN STRL REUS W/ TWL XL LVL3 (GOWN DISPOSABLE) IMPLANT
GOWN STRL REUS W/TWL LRG LVL3 (GOWN DISPOSABLE) ×1
GOWN STRL REUS W/TWL XL LVL3 (GOWN DISPOSABLE)
KIT BASIN OR (CUSTOM PROCEDURE TRAY) ×2 IMPLANT
KIT TURNOVER KIT A (KITS) IMPLANT
MARKER SKIN DUAL TIP RULER LAB (MISCELLANEOUS) ×2 IMPLANT
MESH HERNIA 3X6 (Mesh General) IMPLANT
NEEDLE HYPO 22GX1.5 SAFETY (NEEDLE) ×2 IMPLANT
PACK BASIC VI WITH GOWN DISP (CUSTOM PROCEDURE TRAY) ×2 IMPLANT
PENCIL SMOKE EVACUATOR (MISCELLANEOUS) ×2 IMPLANT
SPIKE FLUID TRANSFER (MISCELLANEOUS) ×2 IMPLANT
SPONGE T-LAP 4X18 ~~LOC~~+RFID (SPONGE) ×2 IMPLANT
STRIP CLOSURE SKIN 1/2X4 (GAUZE/BANDAGES/DRESSINGS) IMPLANT
SUT MNCRL AB 4-0 PS2 18 (SUTURE) ×2 IMPLANT
SUT PDS AB 2-0 CT2 27 (SUTURE) ×2 IMPLANT
SUT PROLENE 2 0 CT2 30 (SUTURE) ×4 IMPLANT
SUT VIC AB 3-0 SH 18 (SUTURE) ×2 IMPLANT
SUT VIC AB 3-0 SH 27 (SUTURE)
SUT VIC AB 3-0 SH 27XBRD (SUTURE) IMPLANT
SYR BULB IRRIG 60ML STRL (SYRINGE) ×2 IMPLANT
SYR CONTROL 10ML LL (SYRINGE) ×2 IMPLANT
TOWEL OR 17X26 10 PK STRL BLUE (TOWEL DISPOSABLE) ×2 IMPLANT
TOWEL OR NON WOVEN STRL DISP B (DISPOSABLE) ×2 IMPLANT
YANKAUER SUCT BULB TIP 10FT TU (MISCELLANEOUS) IMPLANT

## 2022-01-14 NOTE — H&P (Signed)
Daryl Webb is an 76 y.o. male.   Chief Complaint: right inguinal hernia HPI: 76 yo male with right inguinal hernia. It has been enlarging over the last year. He causes some discomfort with activity.  Past Medical History:  Diagnosis Date   Acute blood loss anemia 06/27/2012   Anemia    Arthritis    CKD (chronic kidney disease)    Constipation due to pain medication    needs stool softner while on pain medication   Diabetes mellitus    Duodenal ulcer 08/13/2015   2014  - Dr Kaplan/GI   Duodenal ulcer with hemorrhage 08/13/2015   2014  - Dr Kaplan/GI   Dyspnea 06/19/2012   GERD (gastroesophageal reflux disease)    H/O: osteoarthritis    Heart murmur    History of kidney stones    History of prostate cancer    History of urethral stricture    Hypercholesterolemia    Hypertension    Melena 06/19/2012   PONV (postoperative nausea and vomiting)    Prostate cancer ([place])    prostate   S/P TAVR (transcatheter aortic valve replacement) 08/12/2020   s/p TAVR with a 29 mm Edwards Sapien 3 THV via the TF approach by [name] and [name]   Severe aortic stenosis    Spinal stenosis    Ulcer duodenal hemorrhage     Past Surgical History:  Procedure Laterality Date   ARTHROPLASTY  2004   right knee [name] SURGERY     BIOPSY  11/21/2021   Procedure: BIOPSY;  Surgeon: Doran Stabler, MD;  Location: [facility];  Service: Gastroenterology;;   CARDIAC CATHETERIZATION     CARPAL TUNNEL RELEASE     right hand,wrist [name]   CYSTOSCOPY N/A 09/20/2012   Procedure: Consuela Mimes;  Surgeon: [name], MD;  Location: [facility];  Service: Urology;  Laterality: N/A;   ESOPHAGOGASTRODUODENOSCOPY N/A 11/21/2021   Procedure: ESOPHAGOGASTRODUODENOSCOPY (EGD);  Surgeon: Doran Stabler, MD;  Location: [facility];  Service: Gastroenterology;  Laterality: N/A;   ESOPHAGOGASTRODUODENOSCOPY (EGD) WITH PROPOFOL N/A 11/30/2021   Procedure:  ESOPHAGOGASTRODUODENOSCOPY (EGD) WITH PROPOFOL;  Surgeon: Jerene Bears, MD;  Location: [facility];  Service: Gastroenterology;  Laterality: N/A;   HEMOSTASIS CLIP PLACEMENT  11/30/2021   Procedure: HEMOSTASIS CLIP PLACEMENT;  Surgeon: Jerene Bears, MD;  Location: [facility];  Service: Gastroenterology;;   JOINT REPLACEMENT Right    KNEE ARTHROSCOPY Right    LUMBAR LAMINECTOMY  02/08/2013   PROSTATECTOMY     radical   RIGHT/LEFT HEART CATH AND CORONARY ANGIOGRAPHY N/A 05/08/2020   Procedure: RIGHT/LEFT HEART CATH AND CORONARY ANGIOGRAPHY;  Surgeon: [name], MD;  Location: [facility];  Service: Cardiovascular;  Laterality: N/A;   TEE WITHOUT CARDIOVERSION N/A 08/12/2020   Procedure: TRANSESOPHAGEAL ECHOCARDIOGRAM (TEE);  Surgeon: [name], MD;  Location: [place] [facility];  Service: Open Heart Surgery;  Laterality: N/A;   TOOTH EXTRACTION  12/2021   TRANSCATHETER AORTIC VALVE REPLACEMENT, TRANSFEMORAL N/A 08/12/2020   Procedure: TRANSCATHETER AORTIC VALVE REPLACEMENT, TRANSFEMORAL;  Surgeon: [name], MD;  Location: [facility];  Service: Open Heart Surgery;  Laterality: N/A;   VASECTOMY  1985    Family History  Problem Relation Age of Onset   Heart disease Mother    Heart failure Mother    Heart disease Father    Diabetes Father    Social History:  reports that he quit smoking about 41 years  ago. His smoking use included cigarettes. He has a 30.00 pack-year smoking history. He has never used smokeless tobacco. He reports that he does not drink alcohol and does not use drugs.  Allergies:  Allergies  Allergen Reactions   Aspirin Other (See Comments)    Bleeding ulcer   Diclofenac Other (See Comments)    Bleeding ulcer   Excedrin Tension Headache [Acetaminophen-Caffeine] Other (See Comments)    Bleeding ulcer   Motrin [Ibuprofen] Other (See Comments)    Bleeding ulcer    Medications Prior to Admission  Medication Sig Dispense Refill    amLODipine (NORVASC) 5 MG tablet Take 1 tablet (5 mg total) by mouth daily. 90 tablet 2   atorvastatin (LIPITOR) 40 MG tablet Take 1 tablet (40 mg total) by mouth daily. (Patient taking differently: Take 40 mg by mouth at bedtime.) 90 tablet 3   calcium carbonate (TUMS - DOSED IN MG ELEMENTAL CALCIUM) 500 MG chewable tablet Chew 2 tablets by mouth daily as needed for indigestion or heartburn.     Cholecalciferol (VITAMIN D3) 1000 units CAPS Take 2,000 mg by mouth daily.     Coenzyme Q10 (COQ10) 100 MG CAPS Take 100 mg by mouth daily.     cyanocobalamin (VITAMIN B12) 1000 MCG/ML injection Inject 1,000 mcg into the muscle every 30 (thirty) days.     Dulaglutide (TRULICITY) 4.09 WJ/1.9JY SOPN Inject 0.75 mg into the skin once a week. THURSDAY     FARXIGA 10 MG TABS tablet Take 10 mg by mouth in the morning.     ferrous gluconate (FERGON) 324 MG tablet Take 1 tablet (324 mg total) by mouth daily with breakfast. 30 tablet 3   losartan-hydrochlorothiazide (HYZAAR) 50-12.5 MG tablet TAKE 1 TABLET DAILY 90 tablet 1   metFORMIN (GLUCOPHAGE) 500 MG tablet Take 500 mg by mouth 2 (two) times daily with a meal.     metoprolol succinate (TOPROL-XL) 50 MG 24 hr tablet TAKE 1 TABLET DAILY 90 tablet 2   NON FORMULARY Take 2 capsules by mouth See admin instructions. Vicks ZzzQuil Pure Zzzs Triple Action Gummies- Chew 2 gummies by mouth every night at bedtime     pantoprazole (PROTONIX) 40 MG tablet Take 1 tablet (40 mg total) by mouth 2 (two) times daily before a meal. 60 tablet 2   TYLENOL 500 MG tablet Take 1,000 mg by mouth every 6 (six) hours as needed for headache.     amoxicillin (AMOXIL) 500 MG capsule Take 4 capsules (2,000 mg total) by mouth See admin instructions. Take 2000 mg 1 hour prior to dental procedures 12 capsule 2   Exenatide ER (BYDUREON BCISE) 2 MG/0.85ML AUIJ Inject 2 mg into the skin once a week. (Patient not taking: Reported on 01/04/2022)      Results for orders placed or performed during  the hospital encounter of 01/14/22 (from the past 48 hour(s))  Glucose, capillary     Status: Abnormal   Collection Time: 01/14/22  6:22 AM  Result Value Ref Range   Glucose-Capillary 141 (H) 70 - 99 mg/dL    Comment: Glucose reference range applies only to samples taken after fasting for at least 8 hours.   Comment 1 Notify RN    Comment 2 Document in Chart    No results found.  Review of Systems  Constitutional: Negative.   HENT: Negative.    Eyes: Negative.   Respiratory: Negative.    Cardiovascular: Negative.   Gastrointestinal: Negative.   Endocrine: Negative.   Genitourinary: Negative.  Musculoskeletal: Negative.   Skin: Negative.   Allergic/Immunologic: Negative.   Neurological: Negative.   Hematological: Negative.   Psychiatric/Behavioral: Negative.      Blood pressure 132/78, pulse 77, temperature 98.2 F (36.8 C), temperature source Oral, resp. rate 18, height '5\' 7"'$  (1.702 m), weight 69.9 kg, SpO2 97 %. Physical Exam Vitals reviewed.  Constitutional:      Appearance: He is well-developed.  HENT:     Head: Normocephalic and atraumatic.  Eyes:     Conjunctiva/sclera: Conjunctivae normal.     Pupils: Pupils are equal, round, and reactive to light.  Cardiovascular:     Rate and Rhythm: Normal rate and regular rhythm.  Pulmonary:     Effort: Pulmonary effort is normal.     Breath sounds: Normal breath sounds.  Abdominal:     General: Bowel sounds are normal. There is no distension.     Palpations: Abdomen is soft.     Tenderness: There is no abdominal tenderness.     Comments: Large right inguinal hernia  Musculoskeletal:        General: Normal range of motion.     Cervical back: Normal range of motion and neck supple.  Skin:    General: Skin is warm and dry.  Neurological:     Mental Status: He is alert and oriented to person, place, and time.  Psychiatric:        Behavior: Behavior normal.      Assessment/Plan 76 yo male with right inguinal  hernia -open right inguinal hernia repair with mesh -planned outpatient procedure  Mickeal Skinner, MD 01/14/2022, 7:32 AM

## 2022-01-14 NOTE — Anesthesia Procedure Notes (Signed)
Procedure Name: Intubation Date/Time: 01/14/2022 7:42 AM  Performed by: Sharlette Dense, CRNAPre-anesthesia Checklist: Patient identified, Emergency Drugs available, Suction available and Patient being monitored Patient Re-evaluated:Patient Re-evaluated prior to induction Oxygen Delivery Method: Circle system utilized Preoxygenation: Pre-oxygenation with 100% oxygen Induction Type: IV induction Ventilation: Mask ventilation without difficulty Laryngoscope Size: Miller and 3 Grade View: Grade I Tube type: Oral Tube size: 8.0 mm Number of attempts: 1 Airway Equipment and Method: Stylet Placement Confirmation: ETT inserted through vocal cords under direct vision, positive ETCO2 and breath sounds checked- equal and bilateral Secured at: 22 cm Tube secured with: Tape Dental Injury: Teeth and Oropharynx as per pre-operative assessment

## 2022-01-14 NOTE — Anesthesia Postprocedure Evaluation (Signed)
Anesthesia Post Note  Patient: Daryl Webb  Procedure(s) Performed: RIGHT OPEN INGUINAL HERNIA REPAIR WITH MESH (Right)     Patient location during evaluation: PACU Anesthesia Type: General and Regional Level of consciousness: awake and alert Pain management: pain level controlled Vital Signs Assessment: post-procedure vital signs reviewed and stable Respiratory status: spontaneous breathing, nonlabored ventilation and respiratory function stable Cardiovascular status: blood pressure returned to baseline and stable Postop Assessment: no apparent nausea or vomiting Anesthetic complications: no  No notable events documented.  Last Vitals:  Vitals:   01/14/22 0930 01/14/22 0945  BP: (!) 122/59 117/70  Pulse: 71 77  Resp: 19 20  Temp:  36.5 C  SpO2: 100% 100%    Last Pain:  Vitals:   01/14/22 0930  TempSrc:   PainSc: Asleep                 Zori Benbrook,W. EDMOND

## 2022-01-14 NOTE — Transfer of Care (Signed)
Immediate Anesthesia Transfer of Care Note  Patient: Daryl Webb  Procedure(s) Performed: RIGHT OPEN INGUINAL HERNIA REPAIR WITH MESH (Right)  Patient Location: PACU  Anesthesia Type:General  Level of Consciousness: awake and alert   Airway & Oxygen Therapy: Patient Spontanous Breathing and Patient connected to face mask oxygen  Post-op Assessment: Report given to RN and Post -op Vital signs reviewed and stable  Post vital signs: Reviewed and stable  Last Vitals:  Vitals Value Taken Time  BP    Temp    Pulse 75 01/14/22 0901  Resp 18 01/14/22 0901  SpO2 100 % 01/14/22 0901  Vitals shown include unvalidated device data.  Last Pain:  Vitals:   01/14/22 0615  TempSrc: Oral         Complications: No notable events documented.

## 2022-01-14 NOTE — Op Note (Signed)
Preop diagnosis: right inguinal hernia  Postop diagnosis: right indirect inguinal hernia  Procedure: open Right inguinal hernia repair with mesh  Surgeon: Gurney Maxin, M.D.  Asst: Sheldon Silvan, M.D.  Anesthesia: Gen.   Indications for procedure: Daryl Webb is a 76 y.o. male with symptoms of pain and enlarging Right inguinal hernia(s). After discussing risks, alternatives and benefits he decided on open repair and was brought to day surgery for repair.  Description of procedure: The patient was brought into the operative suite, placed supine. Anesthesia was administered with endotracheal tube. Patient was strapped in place. The patient was prepped and draped in the usual sterile fashion.  The anterior superior iliac spine and pubic tubercle were identified on the Right side. An incision was made 1cm above the connecting line, representative of the location of the inguinal ligament. The subcutaneous tissue was bluntly dissected, scarpa's fascia was dissected away. The external abdominal oblique fascia was identified and sharply opened down to the external inguinal ring. The conjoint tendon and inguinal ligament were identified. The cord structures and sac were dissected free of the surrounding tissue in 360 degrees. A penrose drain was used to encircle the contents. The cremasteric fibers were dissected free of the contents of the cord and hernia sac. The cord structures (vessels and vas deferens) were identified and carefully dissected away from the hernia sac. The hernia sac was reduced and contained no visceral structures.The hernia sac was dissected down to the internal inguinal ring. Preperitoneal fat was identified showing appropriate dissection. The sac was then reduced into the preperitoneal space. The hernia was indirect. A 3x6 Bard mesh was then used to close the defect and reinforce the floor. The mesh was sutured to the lacunar ligament and inguinal ligament using a 2-0  prolene in running fashion. Next the superior edge of the mesh was sutured to the conjoined tendon using a 2-0 running Prolene. An additional 2-0 Prolene was used to suture the tail ends of the mesh together re-creating the deep ring. Cord structures are running in a neutral position through the mesh. Next the external abdominal oblique fascia was closed with a 2-0 Vicryl in interrupted fashion to re-create the external inguinal ring. Scarpa's fascia was closed with 3-0 Vicryl in running fashion. Skin was closed with a 4-0 Monocryl subcuticular stitch in running fashion. Dermabond place for dressing. Patient woke from anesthesia and brought to PACU in stable condition. All counts are correct.  Findings: right indirect inguinal hernia  Specimen: none  Blood loss: 20 ml  Local anesthesia: 10 ml Marcaine  Complications: none  Implant: 3 x 6 in Bard mesh  Gurney Maxin, M.D. General, Bariatric, & Minimally Invasive Surgery [facility], [place] 9:03 AM 01/14/2022

## 2022-01-14 NOTE — Anesthesia Procedure Notes (Signed)
Anesthesia Regional Block: TAP block   Pre-Anesthetic Checklist: , timeout performed,  Correct Patient, Correct Site, Correct Laterality,  Correct Procedure, Correct Position, site marked,  Risks and benefits discussed,  Pre-op evaluation,  At surgeon's request and post-op pain management  Laterality: Right  Prep: Maximum Sterile Barrier Precautions used, chloraprep       Needles:  Injection technique: Single-shot  Needle Type: Echogenic Stimulator Needle     Needle Length: 9cm  Needle Gauge: 21     Additional Needles:   Narrative:  Start time: 01/14/2022 7:11 AM End time: 01/14/2022 7:21 AM Injection made incrementally with aspirations every 5 mL.  Performed by: Personally  Anesthesiologist: Roderic Palau, MD

## 2022-01-15 ENCOUNTER — Encounter (HOSPITAL_COMMUNITY): Payer: Self-pay | Admitting: General Surgery

## 2022-01-15 NOTE — Telephone Encounter (Signed)
Referral placed.

## 2022-01-15 NOTE — Telephone Encounter (Signed)
I did place a referral for neuropsychological evaluation but if his wife feels like he is progressively getting worse, it is okay to place a neurology referral. Thanks, BJ

## 2022-01-15 NOTE — Addendum Note (Signed)
Addended by: Nathanial Millman E on: 01/15/2022 02:06 PM   Modules accepted: Orders

## 2022-01-19 ENCOUNTER — Telehealth: Payer: Self-pay | Admitting: Family Medicine

## 2022-01-19 NOTE — Telephone Encounter (Signed)
Spouse called, inquiring about the Neurology referral.  Confirmation of created referral given to Spouse.

## 2022-01-23 ENCOUNTER — Emergency Department (HOSPITAL_BASED_OUTPATIENT_CLINIC_OR_DEPARTMENT_OTHER)
Admission: EM | Admit: 2022-01-23 | Discharge: 2022-01-23 | Disposition: A | Discharge status: Home / Self Care | Payer: Medicare Other | Attending: Emergency Medicine | Admitting: Emergency Medicine

## 2022-01-23 ENCOUNTER — Encounter (HOSPITAL_BASED_OUTPATIENT_CLINIC_OR_DEPARTMENT_OTHER): Payer: Self-pay

## 2022-01-23 ENCOUNTER — Emergency Department (HOSPITAL_BASED_OUTPATIENT_CLINIC_OR_DEPARTMENT_OTHER): Payer: Medicare Other

## 2022-01-23 DIAGNOSIS — Z9889 Other specified postprocedural states: Secondary | ICD-10-CM | POA: Insufficient documentation

## 2022-01-23 DIAGNOSIS — S300XXA Contusion of lower back and pelvis, initial encounter: Secondary | ICD-10-CM | POA: Diagnosis not present

## 2022-01-23 DIAGNOSIS — S3023XS Contusion of vagina and vulva, sequela: Secondary | ICD-10-CM

## 2022-01-23 DIAGNOSIS — R109 Unspecified abdominal pain: Secondary | ICD-10-CM | POA: Diagnosis present

## 2022-01-23 DIAGNOSIS — G8918 Other acute postprocedural pain: Secondary | ICD-10-CM | POA: Insufficient documentation

## 2022-01-23 DIAGNOSIS — N99841 Postprocedural hematoma of a genitourinary system organ or structure following other procedure: Secondary | ICD-10-CM | POA: Insufficient documentation

## 2022-01-23 DIAGNOSIS — R410 Disorientation, unspecified: Secondary | ICD-10-CM | POA: Insufficient documentation

## 2022-01-23 LAB — URINALYSIS, ROUTINE W REFLEX MICROSCOPIC
Bilirubin Urine: NEGATIVE
Glucose, UA: >1000 mg/dL — AB
Hgb urine dipstick: NEGATIVE
Ketones, ur: NEGATIVE mg/dL
Leukocytes,Ua: NEGATIVE
Nitrite: NEGATIVE
Specific Gravity, Urine: 1.012 (ref 1.005–1.030)
pH: 5.5 (ref 5.0–8.0)

## 2022-01-23 LAB — COMPREHENSIVE METABOLIC PANEL
ALT: 12 U/L (ref 0–44)
AST: 12 U/L — ABNORMAL LOW (ref 15–41)
Albumin: 3.9 g/dL (ref 3.5–5.0)
Alkaline Phosphatase: 80 U/L (ref 38–126)
Anion gap: 10 (ref 5–15)
BUN: 39 mg/dL — ABNORMAL HIGH (ref 8–23)
CO2: 26 mmol/L (ref 22–32)
Calcium: 9.4 mg/dL (ref 8.9–10.3)
Chloride: 99 mmol/L (ref 98–111)
Creatinine, Ser: 1.71 mg/dL — ABNORMAL HIGH (ref 0.61–1.24)
GFR, Estimated: 41 mL/min — ABNORMAL LOW (ref >60)
Glucose, Bld: 227 mg/dL — ABNORMAL HIGH (ref 70–99)
Potassium: 4.1 mmol/L (ref 3.5–5.1)
Sodium: 135 mmol/L (ref 135–145)
Total Bilirubin: 0.7 mg/dL (ref 0.3–1.2)
Total Protein: 6.9 g/dL (ref 6.5–8.1)

## 2022-01-23 LAB — CBC
HCT: 32 % — ABNORMAL LOW (ref 39.0–52.0)
Hemoglobin: 9.5 g/dL — ABNORMAL LOW (ref 13.0–17.0)
MCH: 26.5 pg (ref 26.0–34.0)
MCHC: 29.7 g/dL — ABNORMAL LOW (ref 30.0–36.0)
MCV: 89.4 fL (ref 80.0–100.0)
Platelets: 284 10*3/uL (ref 150–400)
RBC: 3.58 MIL/uL — ABNORMAL LOW (ref 4.22–5.81)
RDW: 16.3 % — ABNORMAL HIGH (ref 11.5–15.5)
WBC: 10.7 10*3/uL — ABNORMAL HIGH (ref 4.0–10.5)
nRBC: 0 % (ref 0.0–0.2)

## 2022-01-23 LAB — LIPASE, BLOOD: Lipase: 34 U/L (ref 11–51)

## 2022-01-23 LAB — CK: Total CK: 26 U/L — ABNORMAL LOW (ref 49–397)

## 2022-01-23 MED ORDER — LACTATED RINGERS IV BOLUS
500.0000 mL | Freq: Once | INTRAVENOUS | Status: AC
  Administered 2022-01-23: 500 mL via INTRAVENOUS

## 2022-01-23 MED ORDER — IOHEXOL 300 MG/ML  SOLN
100.0000 mL | Freq: Once | INTRAMUSCULAR | Status: AC | PRN
  Administered 2022-01-23: 80 mL via INTRAVENOUS

## 2022-01-23 NOTE — ED Triage Notes (Signed)
Pt had hernia surgery 01/14/22. Per wife, pt has been more confused and "befuddled" since surgery. Pt has brusing from waist to knees per wife on right side. Denies urinary issues. Pt denies any blood in stool as well.

## 2022-01-23 NOTE — ED Notes (Signed)
Reviewed AVS/discharge instruction with patient. Time allotted for and all questions answered. Patient is agreeable for d/c and escorted to ed exit by staff.  

## 2022-01-23 NOTE — ED Provider Notes (Signed)
[facility] EMERGENCY DEPT Provider Note   CSN: [id number] Arrival date & time: 01/23/22  4696     History  Chief Complaint  Patient presents with   Abdominal Pain    Daryl Webb is a 76 y.o. male.  HPI Patient is postoperative inguinal hernia repair 9 days.  He is continuing to have a moderate amount of pain with activities.  Also, the patient's wife had noted some confusion over the past couple of days.  She reports at baseline there were some concerns for memory and recall.  They are scheduled to see a neurologist but there is no formal diagnosis at this time.  Over the past couple days she has noted an increase in the amount of times he is asking about what procedure it was and what happened.  Patient also has taken oxycodone over the past couple of evenings for pain control.    Home Medications Prior to Admission medications   Medication Sig Start Date End Date Taking? Authorizing Provider  amLODipine (NORVASC) 5 MG tablet Take 1 tablet (5 mg total) by mouth daily. 08/26/21   Hilty, Nadean Corwin, MD  atorvastatin (LIPITOR) 40 MG tablet Take 1 tablet (40 mg total) by mouth daily. Patient taking differently: Take 40 mg by mouth at bedtime. 08/26/21   Hilty, Nadean Corwin, MD  calcium carbonate (TUMS - DOSED IN MG ELEMENTAL CALCIUM) 500 MG chewable tablet Chew 2 tablets by mouth daily as needed for indigestion or heartburn.    [provider]  Cholecalciferol (VITAMIN D3) 1000 units CAPS Take 2,000 Units by mouth daily. 01/14/22   Kinsinger, Arta Bruce, MD  Coenzyme Q10 (COQ10) 100 MG CAPS Take 100 mg by mouth daily.    [provider]  cyanocobalamin (VITAMIN B12) 1000 MCG/ML injection Inject 1,000 mcg into the muscle every 30 (thirty) days.    [provider]  Dulaglutide (TRULICITY) 2.95 MW/4.1LK SOPN Inject 0.75 mg into the skin once a week. THURSDAY    [provider]  Exenatide ER (BYDUREON BCISE) 2 MG/0.85ML AUIJ Inject 2 mg  into the skin once a week. Patient not taking: Reported on 01/04/2022    [provider]  FARXIGA 10 MG TABS tablet Take 10 mg by mouth in the morning.    [provider]  ferrous gluconate (FERGON) 324 MG tablet Take 1 tablet (324 mg total) by mouth daily with breakfast. 11/30/21   Shawna Clamp, MD  losartan-hydrochlorothiazide [facility]) 50-12.5 MG tablet TAKE 1 TABLET DAILY 08/31/21   Hilty, Nadean Corwin, MD  metFORMIN (GLUCOPHAGE) 500 MG tablet Take 500 mg by mouth 2 (two) times daily with a meal.    [provider]  metoprolol succinate (TOPROL-XL) 50 MG 24 hr tablet TAKE 1 TABLET DAILY 04/30/21   Hilty, Nadean Corwin, MD  NON FORMULARY Take 2 capsules by mouth See admin instructions. Vicks ZzzQuil Pure Zzzs Triple Action Gummies- Chew 2 gummies by mouth every night at bedtime    [provider]  oxyCODONE (OXY IR/ROXICODONE) 5 MG immediate release tablet Take 1 tablet (5 mg total) by mouth every 6 (six) hours as needed for severe pain. 01/14/22   Kinsinger, Arta Bruce, MD  pantoprazole (PROTONIX) 40 MG tablet Take 1 tablet (40 mg total) by mouth 2 (two) times daily before a meal. 12/03/21   Debbe Odea, MD  TYLENOL 500 MG tablet Take 1,000 mg by mouth every 6 (six) hours as needed for headache.    [provider]  Allergies    Aspirin, Diclofenac, Excedrin tension headache [acetaminophen-caffeine], and Motrin [ibuprofen]    Review of Systems   Review of Systems  Physical Exam Updated Vital Signs BP 137/72   Pulse 92   Temp 97.8 F (36.6 C) (Oral)   Resp 19   Ht '5\' 7"'$  (1.702 m)   Wt 70.3 kg   SpO2 100%   BMI 24.28 kg/m  Physical Exam Constitutional:      Comments: Alert and nontoxic.  Pleasantly interactive.  No respiratory distress.  HENT:     Head: Normocephalic and atraumatic.     Mouth/Throat:     Pharynx: Oropharynx is clear.  Eyes:     Extraocular Movements: Extraocular movements intact.  Cardiovascular:     Rate and Rhythm:  Normal rate and regular rhythm.  Pulmonary:     Effort: Pulmonary effort is normal.     Breath sounds: Normal breath sounds.  Abdominal:     Comments: Abdomen soft without guarding.  Patient has a healing incision in the right lower abdomen over the inguinal region.  There is extensive ecchymoses present.  Dense ecchymosis of the scrotum and a large patch of ecchymosis tracking around the right flank area.  Lower abdomen is moderately tender but patient does not have guarding.  Patient has extensive ecchymosis of the scrotum and perineum but does not have any significant fluid collection or swelling.  Musculoskeletal:        General: Normal range of motion.     Right lower leg: No edema.     Left lower leg: No edema.  Skin:    General: Skin is warm and dry.  Neurological:     General: No focal deficit present.     Mental Status: He is oriented to person, place, and time.     Motor: No weakness.     Coordination: Coordination normal.  Psychiatric:        Mood and Affect: Mood normal.     ED Results / Procedures / Treatments   Labs (all labs ordered are listed, but only abnormal results are displayed) Labs Reviewed  COMPREHENSIVE METABOLIC PANEL - Abnormal; Notable for the following components:      Result Value   Glucose, Bld 227 (*)    BUN 39 (*)    Creatinine, Ser 1.71 (*)    AST 12 (*)    GFR, Estimated 41 (*)    All other components within normal limits  CBC - Abnormal; Notable for the following components:   WBC 10.7 (*)    RBC 3.58 (*)    Hemoglobin 9.5 (*)    HCT 32.0 (*)    MCHC 29.7 (*)    RDW 16.3 (*)    All other components within normal limits  URINALYSIS, ROUTINE W REFLEX MICROSCOPIC - Abnormal; Notable for the following components:   Color, Urine COLORLESS (*)    Glucose, UA >1,000 (*)    Protein, ur TRACE (*)    All other components within normal limits  CK - Abnormal; Notable for the following components:   Total CK 26 (*)    All other components within  normal limits  LIPASE, BLOOD    EKG None  Radiology CT Abdomen Pelvis W Contrast  Result Date: 01/23/2022 CLINICAL DATA:  Postoperative pain, swelling and bruising following right inguinal hernia repair on 01/14/2022. EXAM: CT ABDOMEN AND PELVIS WITH CONTRAST TECHNIQUE: Multidetector CT imaging of the abdomen and pelvis was performed using the standard protocol following bolus administration  of intravenous contrast. RADIATION DOSE REDUCTION: This exam was performed according to the departmental dose-optimization program which includes automated exposure control, adjustment of the mA and/or kV according to patient size and/or use of iterative reconstruction technique. CONTRAST:  27m OMNIPAQUE IOHEXOL 300 MG/ML  SOLN COMPARISON:  Abdomen MRI dated 06/08/2020 and CTA of the chest, abdomen and pelvis dated 05/14/2020. FINDINGS: Lower chest: Borderline enlarged heart. Prosthetic aortic valve. Clear lung bases. Hepatobiliary: No focal liver abnormality is seen. No gallstones, gallbladder wall thickening, or biliary dilatation. Pancreas: Unremarkable. No pancreatic ductal dilatation or surrounding inflammatory changes. Spleen: Normal in size without focal abnormality. Adrenals/Urinary Tract: Normal appearing adrenal glands. Previously noted benign right renal cysts. These do not need imaging follow-up. Unremarkable left kidney, ureters and urinary bladder. Stomach/Bowel: Stomach is within normal limits. Appendix appears normal. No evidence of bowel wall thickening, distention, or inflammatory changes. Vascular/Lymphatic: Atheromatous arterial calcifications without aneurysm. No enlarged lymph nodes. Reproductive: Surgically absent prostate gland. Other: Enlarged right inguinal canal filled with fluid and vessels. Small amount of fluid lateral and superior to the right inguinal canal and in the overlying rectus abdominus muscle, within normal limits of postoperative fluid. There are several postoperative  hematomas in the right anterior pelvic subcutaneous fat. The 2 largest measure 6.2 x 2.0 cm on image number 63 and 5.5 x 2.8 cm on image number 55/3. There is a small amount of normal postoperative air in the subcutaneous fat in the right lower anterior pelvic region. Musculoskeletal: Lumbar and lower thoracic spine degenerative changes. Lumbar spine laminectomy defects and interbody and pedicle screw and rod fixation at the L3 through L5 levels. Mild anterolisthesis is again demonstrated at the L4-5 level and mild retrolisthesis is again demonstrated at the L2-3 level. IMPRESSION: 1. Several postoperative hematomas in the right anterior pelvic subcutaneous fat. 2. Enlarged right inguinal canal filled with fluid and normal vessels. Electronically Signed   By: SClaudie ReveringM.D.   On: 01/23/2022 13:45    Procedures Procedures    Medications Ordered in ED Medications  lactated ringers bolus 500 mL (0 mLs Intravenous Stopped 01/23/22 1306)  iohexol (OMNIPAQUE) 300 MG/ML solution 100 mL (80 mLs Intravenous Contrast Given 01/23/22 1315)    ED Course/ Medical Decision Making/ A&P                           Medical Decision Making Amount and/or Complexity of Data Reviewed Labs: ordered. Radiology: ordered.  Risk Prescription drug management.   Patient is 9 days postoperative.  Patient does have extensive ecchymosis.  Will proceed with CT of the abdomen and pelvis to evaluate for any retroperitoneal bleeding or significant inflammatory changes, obstruction or fluid collections.  Patient is alert and nontoxic.  He does not have fever.  At this time I have low suspicion for sepsis.  Mild elevation in white count 10.7.  Hemoglobin 9.5 which is down from baseline of 10.8.  CK is at 26.  CT scan interpreted by radiology shows fluid collections consistent with postoperative hematoma and fluid collection in the inguinal canal within normal limits of postoperative fluid collection.  No inflammatory changes  noted or suggestion of abscess pockets.  This time with patient otherwise clinically well in appearance and alert, I have low suspicion for sepsis or likely secondary infection at this time.  Patient has a large amount of postoperative hematoma which likely still has a fair amount of pain associated.  I have discussed with the patient and  his wife the monitor time needed to reabsorb a lot of hematoma and some of the discomfort and inflammation in the local tissues.  We have also reviewed the need for close monitoring by surgery for any changes.  I recommend they call the office on Monday to try to get a office recheck and careful return precautions reviewed for becoming back to the emergency department event of fever, any increasing confusion or other concerning changes.  Regarding some confusion that the patient's wife noted, we discussed a prior history of memory deficits that are going to be under evaluation by neurology.  This has apparently been significantly more notable in the past several days.  Patient also has had some doses of oxycodone for pain control.  At this time I suspect that in the setting of postoperative condition and narcotic pain medications patient may be experiencing some more pronounced mild cognitive impairment.  During her interview in the emergency department the patient is alert and interactive.  He is not showing signs of confusion or delirium.  I have recommended they watch this closely at home and return immediately if there is some change persistent significant change.        Final Clinical Impression(s) / ED Diagnoses Final diagnoses:  Perineal hematoma, sequela  Post-op pain  Confusion    Rx / DC Orders ED Discharge Orders     None         Charlesetta Shanks, MD 01/23/22 1544

## 2022-01-23 NOTE — Discharge Instructions (Signed)
1.  You should get a recheck with your surgical team as soon as possible.  Call Monday to schedule a recheck ideally this week. 2.  You had a CT scan done.  You have a very large amount of blood that needs to reabsorb after your surgery.  Will take a number more weeks. 3.  Try to continue to move around and walk to increase circulation and blood flow.  You are postoperative from a hernia repair, so absolutely no heavy lifting, no squatting and no straining with your abdominal muscles. 4.  You mentioned some mild confusion and memory problems.  Sometimes people who have mild cognitive impairment and memory problems are significantly worse in the setting of significant physical stress such as an infection, and operation or medication changes.  At this time I suspect this is the issue but you must continue to watch very closely for any signs of postoperative complications such as infection.  Take your temperature and return if you have a fever, return if confusion is persisting and more than mild problems with recalling events, take extra strength Tylenol for pain control.

## 2022-01-27 ENCOUNTER — Telehealth: Payer: Self-pay | Admitting: *Deleted

## 2022-01-27 NOTE — Telephone Encounter (Signed)
        Patient  visited [facility] ed on 01/23/2022  for amd pain   Telephone encounter attempt :  1st  A HIPAA compliant voice message was left requesting a return call.  Instructed patient to call back at [phone].  [place] [phone] [address] , [place] [address] Email : [email or web address]

## 2022-01-31 ENCOUNTER — Emergency Department (HOSPITAL_COMMUNITY)
Admission: EM | Admit: 2022-01-31 | Discharge: 2022-01-31 | Disposition: A | Discharge status: Home / Self Care | Payer: Medicare Other | Attending: Emergency Medicine | Admitting: Emergency Medicine

## 2022-01-31 ENCOUNTER — Emergency Department (HOSPITAL_COMMUNITY): Payer: Medicare Other

## 2022-01-31 DIAGNOSIS — X58XXXA Exposure to other specified factors, initial encounter: Secondary | ICD-10-CM | POA: Insufficient documentation

## 2022-01-31 DIAGNOSIS — N281 Cyst of kidney, acquired: Secondary | ICD-10-CM | POA: Diagnosis not present

## 2022-01-31 DIAGNOSIS — S301XXA Contusion of abdominal wall, initial encounter: Secondary | ICD-10-CM | POA: Insufficient documentation

## 2022-01-31 DIAGNOSIS — R4182 Altered mental status, unspecified: Secondary | ICD-10-CM | POA: Diagnosis not present

## 2022-01-31 DIAGNOSIS — R079 Chest pain, unspecified: Secondary | ICD-10-CM | POA: Diagnosis not present

## 2022-01-31 DIAGNOSIS — R410 Disorientation, unspecified: Secondary | ICD-10-CM | POA: Diagnosis not present

## 2022-01-31 DIAGNOSIS — K6389 Other specified diseases of intestine: Secondary | ICD-10-CM | POA: Diagnosis not present

## 2022-01-31 DIAGNOSIS — I10 Essential (primary) hypertension: Secondary | ICD-10-CM | POA: Diagnosis not present

## 2022-01-31 DIAGNOSIS — L089 Local infection of the skin and subcutaneous tissue, unspecified: Secondary | ICD-10-CM | POA: Diagnosis not present

## 2022-01-31 DIAGNOSIS — S3991XA Unspecified injury of abdomen, initial encounter: Secondary | ICD-10-CM | POA: Diagnosis present

## 2022-01-31 LAB — CBC WITH DIFFERENTIAL/PLATELET
Abs Immature Granulocytes: 0.02 10*3/uL (ref 0.00–0.07)
Basophils Absolute: 0.1 10*3/uL (ref 0.0–0.1)
Basophils Relative: 1 %
Eosinophils Absolute: 0.1 10*3/uL (ref 0.0–0.5)
Eosinophils Relative: 1 %
HCT: 34.9 % — ABNORMAL LOW (ref 39.0–52.0)
Hemoglobin: 10.8 g/dL — ABNORMAL LOW (ref 13.0–17.0)
Immature Granulocytes: 0 %
Lymphocytes Relative: 14 %
Lymphs Abs: 1.1 10*3/uL (ref 0.7–4.0)
MCH: 27.3 pg (ref 26.0–34.0)
MCHC: 30.9 g/dL (ref 30.0–36.0)
MCV: 88.4 fL (ref 80.0–100.0)
Monocytes Absolute: 0.6 10*3/uL (ref 0.1–1.0)
Monocytes Relative: 8 %
Neutro Abs: 6 10*3/uL (ref 1.7–7.7)
Neutrophils Relative %: 76 %
Platelets: 251 10*3/uL (ref 150–400)
RBC: 3.95 MIL/uL — ABNORMAL LOW (ref 4.22–5.81)
RDW: 16.7 % — ABNORMAL HIGH (ref 11.5–15.5)
WBC: 7.9 10*3/uL (ref 4.0–10.5)
nRBC: 0 % (ref 0.0–0.2)

## 2022-01-31 LAB — I-STAT CHEM 8, ED
BUN: 38 mg/dL — ABNORMAL HIGH (ref 8–23)
Calcium, Ion: 1.16 mmol/L (ref 1.15–1.40)
Chloride: 102 mmol/L (ref 98–111)
Creatinine, Ser: 1.8 mg/dL — ABNORMAL HIGH (ref 0.61–1.24)
Glucose, Bld: 139 mg/dL — ABNORMAL HIGH (ref 70–99)
HCT: 36 % — ABNORMAL LOW (ref 39.0–52.0)
Hemoglobin: 12.2 g/dL — ABNORMAL LOW (ref 13.0–17.0)
Potassium: 4.4 mmol/L (ref 3.5–5.1)
Sodium: 138 mmol/L (ref 135–145)
TCO2: 25 mmol/L (ref 22–32)

## 2022-01-31 LAB — COMPREHENSIVE METABOLIC PANEL
ALT: 15 U/L (ref 0–44)
AST: 18 U/L (ref 15–41)
Albumin: 3.7 g/dL (ref 3.5–5.0)
Alkaline Phosphatase: 86 U/L (ref 38–126)
Anion gap: 8 (ref 5–15)
BUN: 35 mg/dL — ABNORMAL HIGH (ref 8–23)
CO2: 24 mmol/L (ref 22–32)
Calcium: 9.2 mg/dL (ref 8.9–10.3)
Chloride: 105 mmol/L (ref 98–111)
Creatinine, Ser: 1.76 mg/dL — ABNORMAL HIGH (ref 0.61–1.24)
GFR, Estimated: 40 mL/min — ABNORMAL LOW (ref >60)
Glucose, Bld: 142 mg/dL — ABNORMAL HIGH (ref 70–99)
Potassium: 4.2 mmol/L (ref 3.5–5.1)
Sodium: 137 mmol/L (ref 135–145)
Total Bilirubin: 0.7 mg/dL (ref 0.3–1.2)
Total Protein: 7.5 g/dL (ref 6.5–8.1)

## 2022-01-31 LAB — URINALYSIS, ROUTINE W REFLEX MICROSCOPIC
Bacteria, UA: NONE SEEN
Bilirubin Urine: NEGATIVE
Glucose, UA: >=500 mg/dL — AB
Hgb urine dipstick: NEGATIVE
Ketones, ur: NEGATIVE mg/dL
Leukocytes,Ua: NEGATIVE
Nitrite: NEGATIVE
Protein, ur: 30 mg/dL — AB
Specific Gravity, Urine: 1.016 (ref 1.005–1.030)
pH: 5 (ref 5.0–8.0)

## 2022-01-31 LAB — LACTIC ACID, PLASMA: Lactic Acid, Venous: 1.2 mmol/L (ref 0.5–1.9)

## 2022-01-31 MED ORDER — SODIUM CHLORIDE 0.9 % IV BOLUS
1000.0000 mL | Freq: Once | INTRAVENOUS | Status: AC
  Administered 2022-01-31: 1000 mL via INTRAVENOUS

## 2022-01-31 MED ORDER — IOHEXOL 300 MG/ML  SOLN
75.0000 mL | Freq: Once | INTRAMUSCULAR | Status: AC | PRN
  Administered 2022-01-31: 75 mL via INTRAVENOUS

## 2022-01-31 MED ORDER — MORPHINE SULFATE (PF) 2 MG/ML IV SOLN
2.0000 mg | Freq: Once | INTRAVENOUS | Status: AC
  Administered 2022-01-31: 2 mg via INTRAVENOUS
  Filled 2022-01-31: qty 1

## 2022-01-31 NOTE — Discharge Instructions (Signed)
Please call your general surgeon and family doctor on Tuesday and let them know how you are doing.  Please return at any time to the Emergency Department that you would like to be reevaluated.

## 2022-01-31 NOTE — ED Provider Notes (Signed)
[facility] DEPT Provider Note   CSN: [id number] Arrival date & time: 01/31/22  1220     History  Chief Complaint  Patient presents with   Post-op Problem    Daryl Webb is a 75 y.o. male.  76 yo M with a chief complaints of confusion and abdominal pain and swelling.  This been going on since he had his hernia repaired on December 7.  This was done by Dr. Kieth Brightly.  He was seen in the ER about a week ago for similar symptoms.  Had CT imaging that time that showed that he had a postop abdominal wall hematoma.  Family feels like his cognition really has not improved.  Feels like he is in pain off and on.  Only taking his pain medicine at night to help him sleep.  Denies any issues with constipation no urinary symptoms no cough congestion or fever.        Home Medications Prior to Admission medications   Medication Sig Start Date End Date Taking? Authorizing Provider  amLODipine (NORVASC) 5 MG tablet Take 1 tablet (5 mg total) by mouth daily. 08/26/21   Hilty, Nadean Corwin, MD  atorvastatin (LIPITOR) 40 MG tablet Take 1 tablet (40 mg total) by mouth daily. Patient taking differently: Take 40 mg by mouth at bedtime. 08/26/21   Hilty, Nadean Corwin, MD  calcium carbonate (TUMS - DOSED IN MG ELEMENTAL CALCIUM) 500 MG chewable tablet Chew 2 tablets by mouth daily as needed for indigestion or heartburn.    [provider]  Cholecalciferol (VITAMIN D3) 1000 units CAPS Take 2,000 Units by mouth daily. 01/14/22   Kinsinger, Arta Bruce, MD  Coenzyme Q10 (COQ10) 100 MG CAPS Take 100 mg by mouth daily.    [provider]  cyanocobalamin (VITAMIN B12) 1000 MCG/ML injection Inject 1,000 mcg into the muscle every 30 (thirty) days.    [provider]  Dulaglutide (TRULICITY) 2.26 JF/3.5KT SOPN Inject 0.75 mg into the skin once a week. THURSDAY    [provider]  Exenatide ER (BYDUREON BCISE) 2 MG/0.85ML AUIJ Inject 2 mg into the  skin once a week. Patient not taking: Reported on 01/04/2022    [provider]  FARXIGA 10 MG TABS tablet Take 10 mg by mouth in the morning.    [provider]  ferrous gluconate (FERGON) 324 MG tablet Take 1 tablet (324 mg total) by mouth daily with breakfast. 11/30/21   Shawna Clamp, MD  losartan-hydrochlorothiazide [facility]) 50-12.5 MG tablet TAKE 1 TABLET DAILY 08/31/21   Hilty, Nadean Corwin, MD  metFORMIN (GLUCOPHAGE) 500 MG tablet Take 500 mg by mouth 2 (two) times daily with a meal.    [provider]  metoprolol succinate (TOPROL-XL) 50 MG 24 hr tablet TAKE 1 TABLET DAILY 04/30/21   Hilty, Nadean Corwin, MD  NON FORMULARY Take 2 capsules by mouth See admin instructions. Vicks ZzzQuil Pure Zzzs Triple Action Gummies- Chew 2 gummies by mouth every night at bedtime    [provider]  oxyCODONE (OXY IR/ROXICODONE) 5 MG immediate release tablet Take 1 tablet (5 mg total) by mouth every 6 (six) hours as needed for severe pain. 01/14/22   Kinsinger, Arta Bruce, MD  pantoprazole (PROTONIX) 40 MG tablet Take 1 tablet (40 mg total) by mouth 2 (two) times daily before a meal. 12/03/21   Debbe Odea, MD  TYLENOL 500 MG tablet Take 1,000 mg by mouth every 6 (six) hours as needed for headache.  [provider]      Allergies    Aspirin, Diclofenac, Excedrin tension headache [acetaminophen-caffeine], and Motrin [ibuprofen]    Review of Systems   Review of Systems  Physical Exam Updated Vital Signs BP 137/82   Pulse 80   Temp 97.8 F (36.6 C) (Oral)   Resp 16   SpO2 96%  Physical Exam Vitals and nursing note reviewed.  Constitutional:      Appearance: He is well-developed.  HENT:     Head: Normocephalic and atraumatic.  Eyes:     Pupils: Pupils are equal, round, and reactive to light.  Neck:     Vascular: No JVD.  Cardiovascular:     Rate and Rhythm: Normal rate and regular rhythm.     Heart sounds: No murmur heard.    No friction rub. No  gallop.  Pulmonary:     Effort: No respiratory distress.     Breath sounds: No wheezing.  Abdominal:     General: There is no distension.     Tenderness: There is no abdominal tenderness. There is no guarding or rebound.     Comments: Some pain swelling and bruising to the abdominal wall adjacent to the right surgical incision.  No erythema no warmth no drainage.  No obvious palpable mass in the right inguinal canal.  Musculoskeletal:        General: Normal range of motion.     Cervical back: Normal range of motion and neck supple.  Skin:    Coloration: Skin is not pale.     Findings: No rash.  Neurological:     Mental Status: He is alert and oriented to person, place, and time.  Psychiatric:        Behavior: Behavior normal.     ED Results / Procedures / Treatments   Labs (all labs ordered are listed, but only abnormal results are displayed) Labs Reviewed  COMPREHENSIVE METABOLIC PANEL - Abnormal; Notable for the following components:      Result Value   Glucose, Bld 142 (*)    BUN 35 (*)    Creatinine, Ser 1.76 (*)    GFR, Estimated 40 (*)    All other components within normal limits  CBC WITH DIFFERENTIAL/PLATELET - Abnormal; Notable for the following components:   RBC 3.95 (*)    Hemoglobin 10.8 (*)    HCT 34.9 (*)    RDW 16.7 (*)    All other components within normal limits  URINALYSIS, ROUTINE W REFLEX MICROSCOPIC - Abnormal; Notable for the following components:   Glucose, UA >=500 (*)    Protein, ur 30 (*)    All other components within normal limits  I-STAT CHEM 8, ED - Abnormal; Notable for the following components:   BUN 38 (*)    Creatinine, Ser 1.80 (*)    Glucose, Bld 139 (*)    Hemoglobin 12.2 (*)    HCT 36.0 (*)    All other components within normal limits  LACTIC ACID, PLASMA  LACTIC ACID, PLASMA    EKG None  Radiology CT ABDOMEN PELVIS W CONTRAST  Addendum Date: 01/31/2022   ADDENDUM REPORT: 01/31/2022 15:43 CLINICAL DATA:  Altered mental  status. EXAM: CT HEAD WITHOUT CONTRAST TECHNIQUE: Contiguous axial images were obtained from the base of the skull through the vertex without intravenous contrast. RADIATION DOSE REDUCTION: This exam was performed according to the departmental dose-optimization program which includes automated exposure control, adjustment of the mA and/or kV according to patient size and/or use  of iterative reconstruction technique. COMPARISON:  11/20/2021. FINDINGS: Brain: There is periventricular white matter decreased attenuation consistent with small vessel ischemic changes. Gray-white differentiation is preserved. No acute intracranial hemorrhage, mass effect or shift. No hydrocephalus. Vascular: No hyperdense vessel or unexpected calcification. Skull: Normal. Negative for fracture or focal lesion. Sinuses/Orbits: Left maxillary antrum completely opacify consistent with mucocele. Left frontal and ethmoid sinuses opacified as well. IMPRESSION: Periventricular white matter changes consistent with chronic small vessel ischemia. Chronic left-sided sinusitis. No acute intracranial process identified. Electronically Signed   By: Sammie Bench M.D.   On: 01/31/2022 15:43   Result Date: 01/31/2022 CLINICAL DATA:  Abdominal pain.  Recent hernia repair. EXAM: CT ABDOMEN AND PELVIS WITH CONTRAST TECHNIQUE: Multidetector CT imaging of the abdomen and pelvis was performed using the standard protocol following bolus administration of intravenous contrast. RADIATION DOSE REDUCTION: This exam was performed according to the departmental dose-optimization program which includes automated exposure control, adjustment of the mA and/or kV according to patient size and/or use of iterative reconstruction technique. CONTRAST:  75 mL OMNIPAQUE IOHEXOL 300 MG/ML  SOLN COMPARISON:  01/23/2022 and 01/23/2016. FINDINGS: Lower chest: No acute abnormality. Hepatobiliary: No focal liver abnormality is seen. No gallstones, gallbladder wall thickening, or  biliary dilatation. Pancreas: Unremarkable. No pancreatic ductal dilatation or surrounding inflammatory changes. Spleen: Normal in size without focal abnormality. Adrenals/Urinary Tract: 3 cm cyst right kidney midpole. No hydronephrosis or nephrolithiasis. Unremarkable appearance of adrenal glands and urinary bladder Stomach/Bowel: There is increased amount of stool identified in the right colon left colon is distended with fluid. Normal appendix. No small bowel dilatation. Vascular/Lymphatic: Aortic atherosclerosis. No enlarged abdominal or pelvic lymph nodes. Reproductive: Status post prostatectomy. Other: Anterior superficial subcutaneous right lower quadrant loculated fluid collection measuring about 12 x 10 x 5 cm is stable finding consistent with postop hematoma or seroma. No air in the collection to suggest abscess. There is a small amount of air in the subcutaneous tissues superficially which has diminished compared to the prior study. Some fluid attenuation still noted in the right inguinal canal consistent with recent surgery. Musculoskeletal: Postop changes from posterior pedicle fusion discectomies L3-L5. Grade 1 L5 retrolisthesis. Sclerotic lesion left iliac bone consistent measuring 2.8 cm. This is a stable finding consistent with a bone island. IMPRESSION: 1. Persistent postop changes abdominal wall right lower quadrant and right inguinal canal. 2. Right kidney cyst. 3. Constipation and incipient diarrhea. 4. Postop changes of the lumbar spine and prior prostatectomy. Electronically Signed: By: Sammie Bench M.D. On: 01/31/2022 15:25   CT Head Wo Contrast  Addendum Date: 01/31/2022   ADDENDUM REPORT: 01/31/2022 15:43 CLINICAL DATA:  Altered mental status. EXAM: CT HEAD WITHOUT CONTRAST TECHNIQUE: Contiguous axial images were obtained from the base of the skull through the vertex without intravenous contrast. RADIATION DOSE REDUCTION: This exam was performed according to the departmental  dose-optimization program which includes automated exposure control, adjustment of the mA and/or kV according to patient size and/or use of iterative reconstruction technique. COMPARISON:  11/20/2021. FINDINGS: Brain: There is periventricular white matter decreased attenuation consistent with small vessel ischemic changes. Gray-white differentiation is preserved. No acute intracranial hemorrhage, mass effect or shift. No hydrocephalus. Vascular: No hyperdense vessel or unexpected calcification. Skull: Normal. Negative for fracture or focal lesion. Sinuses/Orbits: Left maxillary antrum completely opacify consistent with mucocele. Left frontal and ethmoid sinuses opacified as well. IMPRESSION: Periventricular white matter changes consistent with chronic small vessel ischemia. Chronic left-sided sinusitis. No acute intracranial process identified. Electronically Signed  By: Sammie Bench M.D.   On: 01/31/2022 15:43   Result Date: 01/31/2022 CLINICAL DATA:  Abdominal pain.  Recent hernia repair. EXAM: CT ABDOMEN AND PELVIS WITH CONTRAST TECHNIQUE: Multidetector CT imaging of the abdomen and pelvis was performed using the standard protocol following bolus administration of intravenous contrast. RADIATION DOSE REDUCTION: This exam was performed according to the departmental dose-optimization program which includes automated exposure control, adjustment of the mA and/or kV according to patient size and/or use of iterative reconstruction technique. CONTRAST:  75 mL OMNIPAQUE IOHEXOL 300 MG/ML  SOLN COMPARISON:  01/23/2022 and 01/23/2016. FINDINGS: Lower chest: No acute abnormality. Hepatobiliary: No focal liver abnormality is seen. No gallstones, gallbladder wall thickening, or biliary dilatation. Pancreas: Unremarkable. No pancreatic ductal dilatation or surrounding inflammatory changes. Spleen: Normal in size without focal abnormality. Adrenals/Urinary Tract: 3 cm cyst right kidney midpole. No hydronephrosis or  nephrolithiasis. Unremarkable appearance of adrenal glands and urinary bladder Stomach/Bowel: There is increased amount of stool identified in the right colon left colon is distended with fluid. Normal appendix. No small bowel dilatation. Vascular/Lymphatic: Aortic atherosclerosis. No enlarged abdominal or pelvic lymph nodes. Reproductive: Status post prostatectomy. Other: Anterior superficial subcutaneous right lower quadrant loculated fluid collection measuring about 12 x 10 x 5 cm is stable finding consistent with postop hematoma or seroma. No air in the collection to suggest abscess. There is a small amount of air in the subcutaneous tissues superficially which has diminished compared to the prior study. Some fluid attenuation still noted in the right inguinal canal consistent with recent surgery. Musculoskeletal: Postop changes from posterior pedicle fusion discectomies L3-L5. Grade 1 L5 retrolisthesis. Sclerotic lesion left iliac bone consistent measuring 2.8 cm. This is a stable finding consistent with a bone island. IMPRESSION: 1. Persistent postop changes abdominal wall right lower quadrant and right inguinal canal. 2. Right kidney cyst. 3. Constipation and incipient diarrhea. 4. Postop changes of the lumbar spine and prior prostatectomy. Electronically Signed: By: Sammie Bench M.D. On: 01/31/2022 15:25   DG Chest Port 1 View  Result Date: 01/31/2022 CLINICAL DATA:  Confusion.  Hernia pain. EXAM: PORTABLE CHEST 1 VIEW COMPARISON:  11/20/2021. FINDINGS: Stable changes from previous aortic valve replacement. Cardiac silhouette normal in size and configuration. No mediastinal or hilar masses. Clear lungs.  No pleural effusion or pneumothorax. Skeletal structures are grossly intact. IMPRESSION: No active disease. Electronically Signed   By: Lajean Manes M.D.   On: 01/31/2022 14:35    Procedures Procedures    Medications Ordered in ED Medications  sodium chloride 0.9 % bolus 1,000 mL (0 mLs  Intravenous Stopped 01/31/22 1714)  morphine (PF) 2 MG/ML injection 2 mg (2 mg Intravenous Given 01/31/22 1351)  iohexol (OMNIPAQUE) 300 MG/ML solution 75 mL (75 mLs Intravenous Contrast Given 01/31/22 1453)    ED Course/ Medical Decision Making/ A&P                           Medical Decision Making Amount and/or Complexity of Data Reviewed Labs: ordered. Radiology: ordered.  Risk Prescription drug management.   76 yo M with a chief complaints of confusion and abdominal pain and swelling postop from a hernia repair.  This is the third time the patient has been under general anesthesia in the past few months.  I suspect that could cause him to have some impaired cognition on its own.  Could be due to uncontrolled pain as well as the pain is grimacing on history taking.  Will repeat CT imaging here today.  Bolus of IV fluids for possible dehydration.  Attempted to treat pain.  CT of the head.  Reassess.  CT of the head negative for acute intracranial pathology.  CT of the abdomen pelvis similar to last check about a week ago.  Blood work without significant anemia no significant electrolyte abnormality.  Patient is feeling much better on repeat assessment.  His mental status is improved greatly.  This makes this more likely that it is secondary to either dehydration or poor pain control at home.  I encouraged them to be more judicious with this pain medicine at home.  Will have them call his general surgeon on Tuesday.  5:47 PM:  I have discussed the diagnosis/risks/treatment options with the patient and family.  Evaluation and diagnostic testing in the emergency department does not suggest an emergent condition requiring admission or immediate intervention beyond what has been performed at this time.  They will follow up with PCP, Gen surgery. We also discussed returning to the ED immediately if new or worsening sx occur. We discussed the sx which are most concerning (e.g., sudden worsening pain,  fever, inability to tolerate by mouth) that necessitate immediate return. Medications administered to the patient during their visit and any new prescriptions provided to the patient are listed below.  Medications given during this visit Medications  sodium chloride 0.9 % bolus 1,000 mL (0 mLs Intravenous Stopped 01/31/22 1714)  morphine (PF) 2 MG/ML injection 2 mg (2 mg Intravenous Given 01/31/22 1351)  iohexol (OMNIPAQUE) 300 MG/ML solution 75 mL (75 mLs Intravenous Contrast Given 01/31/22 1453)     The patient appears reasonably screen and/or stabilized for discharge and I doubt any other medical condition or other [facility] requiring further screening, evaluation, or treatment in the ED at this time prior to discharge.          Final Clinical Impression(s) / ED Diagnoses Final diagnoses:  Abdominal wall hematoma, initial encounter  Confusion    Rx / DC Orders ED Discharge Orders     None         Deno Etienne, DO 01/31/22 1747

## 2022-01-31 NOTE — ED Triage Notes (Signed)
Pt BIBA from home for worsening of hernia pain and confusion. Seen at [facility] 12/16 and told there were hematomas but they were not aspirated. Surgical site very tight, yellow bruising noted on lower abdomen. Pt endorses intermittent sharp pain at site.  146/81 HR 88 96% RA CBG 183

## 2022-02-02 DIAGNOSIS — N1832 Chronic kidney disease, stage 3b: Secondary | ICD-10-CM | POA: Diagnosis not present

## 2022-02-02 DIAGNOSIS — K5900 Constipation, unspecified: Secondary | ICD-10-CM | POA: Diagnosis not present

## 2022-02-02 DIAGNOSIS — Z7984 Long term (current) use of oral hypoglycemic drugs: Secondary | ICD-10-CM | POA: Diagnosis not present

## 2022-02-02 DIAGNOSIS — E1122 Type 2 diabetes mellitus with diabetic chronic kidney disease: Secondary | ICD-10-CM | POA: Diagnosis not present

## 2022-02-02 DIAGNOSIS — E1165 Type 2 diabetes mellitus with hyperglycemia: Secondary | ICD-10-CM | POA: Diagnosis not present

## 2022-02-05 NOTE — Progress Notes (Signed)
HPI: Daryl Webb is a 76 y.o. male, who is here today with his wife to follow on recent visit. He was last seen on 12/09/2021 after hospital discharge. Since he is last visit he has seen his gastroenterologist. Evaluated in the ED on 01/31/2022 for abdominal wall hematoma, s/p hernia repair 01/14/22.  Reporting  pain and edema at the site of his surgical hematoma, describing a "hard knot" in the area. He has seen his surgeon and for recommended conservative treatment, he has been applying heat as recommended.   He has not experienced any new symptoms since his ED visit.  His creatinine levels have been fluctuating post-hospital discharge, prompting a temporary cessation of metformin and Trulicity as advised by his endocrinologist, Dr. Sharl Ma. He is scheduled for further lab work to monitor his renal function.  Blood sugar levels have been increasing, with a recent fasting glucose measurement of 200 mg/dL. His last recorded hemoglobin A1C was 7.2% in 11/2021, achieved while on metformin and Trulicity.  According to his wife, he loves sweets and it is hard for him to follow dietary recommendations. He is on Farxiga 10 mg daily.  GERD and PUD: He is on Protonix 40 mg bid. He has not observed any blood in his stool or black stools.  He is currently taking Fe sulfate 325 mg daily. His H/H levels showing improvement from 9.5/32 to 10.8 g/dL/34.9.  He is not longer on NSAID's and since then his arthralgias and back pain have been worse.  Insomnia: Sleep disturbances have been significant since his surgery, he is reporting difficulty sleeping and resorting to the use of hydrocodone-acetaminophen x 1 for sleep. He has previously tried ZzzQuil gummies for mild sleep issues.   HTN on Amlodipine 5 mg daily, Losartan-HCTZ 50-12.5 mg daily,and Metoprolol succinate 50 mg daily. CKD III, he has appt with nephrologist. He denies experiencing chest pain, difficulty breathing, palpitations, gross  hematuria, or excessive foam in the urine.  He reports daily bowel movements without difficulty and denies the use of any walking aids.  In regard to memory difficulties, he has an appt with neurologist already scheduled.  Review of Systems  Constitutional:  Positive for fatigue. Negative for chills and fever.  Respiratory:  Negative for cough and wheezing.   Gastrointestinal:  Negative for nausea and vomiting.  Endocrine: Negative for cold intolerance and heat intolerance.  Musculoskeletal:  Positive for arthralgias and back pain.  Skin:  Negative for rash.  Neurological:  Negative for syncope, weakness and headaches.  Psychiatric/Behavioral:  Negative for confusion and hallucinations.   See other pertinent positives and negatives in HPI.  Current Outpatient Medications on File Prior to Visit  Medication Sig Dispense Refill   amLODipine (NORVASC) 5 MG tablet Take 1 tablet (5 mg total) by mouth daily. 90 tablet 2   atorvastatin (LIPITOR) 40 MG tablet Take 1 tablet (40 mg total) by mouth daily. (Patient taking differently: Take 40 mg by mouth at bedtime.) 90 tablet 3   calcium carbonate (TUMS - DOSED IN MG ELEMENTAL CALCIUM) 500 MG chewable tablet Chew 2 tablets by mouth daily as needed for indigestion or heartburn.     Cholecalciferol (VITAMIN D3) 1000 units CAPS Take 2,000 Units by mouth daily. 1 capsule 0   Coenzyme Q10 (COQ10) 100 MG CAPS Take 100 mg by mouth daily.     cyanocobalamin (VITAMIN B12) 1000 MCG/ML injection Inject 1,000 mcg into the muscle every 30 (thirty) days.     FARXIGA 10 MG TABS tablet Take  10 mg by mouth in the morning.     ferrous gluconate (FERGON) 324 MG tablet Take 1 tablet (324 mg total) by mouth daily with breakfast. 30 tablet 3   losartan-hydrochlorothiazide (HYZAAR) 50-12.5 MG tablet TAKE 1 TABLET DAILY 90 tablet 1   metoprolol succinate (TOPROL-XL) 50 MG 24 hr tablet TAKE 1 TABLET DAILY 90 tablet 2   NON FORMULARY Take 2 capsules by mouth See admin  instructions. Vicks ZzzQuil Pure Zzzs Triple Action Gummies- Chew 2 gummies by mouth every night at bedtime     pantoprazole (PROTONIX) 40 MG tablet Take 1 tablet (40 mg total) by mouth 2 (two) times daily before a meal. 60 tablet 2   TYLENOL 500 MG tablet Take 1,000 mg by mouth every 6 (six) hours as needed for headache.     No current facility-administered medications on file prior to visit.   Past Medical History:  Diagnosis Date   Acute blood loss anemia 06/27/2012   Anemia    Arthritis    CKD (chronic kidney disease)    Constipation due to pain medication    needs stool softner while on pain medication   Diabetes mellitus    Duodenal ulcer 08/13/2015   2014  - Dr Kaplan/GI   Duodenal ulcer with hemorrhage 08/13/2015   2014  - Dr Kaplan/GI   Dyspnea 06/19/2012   GERD (gastroesophageal reflux disease)    H/O: osteoarthritis    Heart murmur    History of kidney stones    History of prostate cancer    History of urethral stricture    Hypercholesterolemia    Hypertension    Melena 06/19/2012   PONV (postoperative nausea and vomiting)    Prostate cancer (HCC)    prostate   S/P TAVR (transcatheter aortic valve replacement) 08/12/2020   s/p TAVR with a 29 mm Edwards Sapien 3 THV via the TF approach by Dr. Excell Seltzer and Dr. Laneta Simmers   Severe aortic stenosis    Spinal stenosis    Ulcer duodenal hemorrhage    Allergies  Allergen Reactions   Aspirin Other (See Comments)    Bleeding ulcer   Diclofenac Other (See Comments)    Bleeding ulcer   Excedrin Tension Headache [Acetaminophen-Caffeine] Other (See Comments)    Bleeding ulcer   Motrin [Ibuprofen] Other (See Comments)    Bleeding ulcer    Social History   Socioeconomic History   Marital status: Married    Spouse name: Not on file   Number of children: 2   Years of education: 16   Highest education level: Bachelor's degree (e.g., BA, AB, BS)  Occupational History   Occupation: Retired  Tobacco Use   Smoking status:  Former    Packs/day: 1.50    Years: 20.00    Total pack years: 30.00    Types: Cigarettes    Quit date: 02/09/1980    Years since quitting: 42.0   Smokeless tobacco: Never  Vaping Use   Vaping Use: Never used  Substance and Sexual Activity   Alcohol use: No   Drug use: No   Sexual activity: Not Currently  Other Topics Concern   Not on file  Social History Narrative   Retired from Engineer, structural (boats).    Lives with wife,    two children in [place].    Enjoys reading, boating, travelling to [place] in the winter with wife.   Social Determinants of Health   Financial Resource Strain: Low Risk  (12/24/2021)   Overall Physicist, medical Strain (  CARDIA)    Difficulty of Paying Living Expenses: Not hard at all  Food Insecurity: No Food Insecurity (12/24/2021)   Hunger Vital Sign    Worried About Running Out of Food in the Last Year: Never true    Ran Out of Food in the Last Year: Never true  Transportation Needs: No Transportation Needs (12/24/2021)   PRAPARE - Administrator, Civil Service (Medical): No    Lack of Transportation (Non-Medical): No  Physical Activity: Inactive (12/24/2021)   Exercise Vital Sign    Days of Exercise per Week: 0 days    Minutes of Exercise per Session: 0 min  Stress: No Stress Concern Present (12/24/2021)   [employer] of Occupational Health - Occupational Stress Questionnaire    Feeling of Stress : Not at all  Social Connections: Moderately Isolated (12/24/2021)   Social Connection and Isolation Panel [NHANES]    Frequency of Communication with Friends and Family: More than three times a week    Frequency of Social Gatherings with Friends and Family: More than three times a week    Attends Religious Services: Never    Database administrator or Organizations: No    Attends Banker Meetings: Never    Marital Status: Married   Vitals:   02/09/22 1422  BP: 120/62  Pulse: 94  Resp: 16  Temp: 98.1 F (36.7 C)   SpO2: 97%   Body mass index is 24.63 kg/m.  Physical Exam Vitals and nursing note reviewed.  Constitutional:      General: He is not in acute distress.    Appearance: He is well-developed.  HENT:     Head: Normocephalic and atraumatic.     Mouth/Throat:     Mouth: Mucous membranes are moist.  Eyes:     Conjunctiva/sclera: Conjunctivae normal.  Cardiovascular:     Rate and Rhythm: Normal rate and regular rhythm.     Pulses:          Dorsalis pedis pulses are 2+ on the right side and 2+ on the left side.     Heart sounds: No murmur heard. Pulmonary:     Effort: Pulmonary effort is normal. No respiratory distress.     Breath sounds: Normal breath sounds.  Abdominal:     Palpations: Abdomen is soft. There is no mass.     Tenderness: There is no abdominal tenderness.    Musculoskeletal:     Right lower leg: 1+ Pitting Edema present.     Left lower leg: 1+ Pitting Edema present.  Lymphadenopathy:     Cervical: No cervical adenopathy.  Skin:    General: Skin is warm.     Findings: No erythema or rash.  Neurological:     Mental Status: He is alert. Mental status is at baseline.     Cranial Nerves: No cranial nerve deficit.     Comments: Antalgic gait, not assisted.  Psychiatric:     Comments: Well groomed, good eye contact.   ASSESSMENT AND PLAN:  Mr.Kase was seen today for follow-up.  Diagnoses and all orders for this visit: Insomnia, unspecified type Assessment & Plan: Problem seems to be getting worse, OTC sleep aids have not help. A safer options is Melatonin 5-15 mg 2 hours before bedtime and with empty stomach. If this does not help, Trazodone 50 mg, started 1/2 tab around bedtime can be started. We discussed some side effects and the risk of interaction with some of her medications, Tramadol specially. Good  sleep hygiene. His wife is now managing his medications.  Overall, his  recovery has been complicated by multiple health issues since 12/2020, including  heart surgery (s/p TAVR), hernia repair, and PUD with GI bleed in 11/2021, which have impacted his overall well-being and recovery trajectory. He has been encouraged to gradually increase his activity level to aid in his recovery and improve sleep quality.  Orders: -     traZODone HCl; Take 0.5-1 tablets (25-50 mg total) by mouth at bedtime as needed for sleep.  Dispense: 30 tablet; Refill: 1  Type 2 diabetes mellitus with stage 3b chronic kidney disease, without long-term current use of insulin (HCC) Assessment & Plan: Metformin and Trulicity have been held due to worsening Cr, last one 1.8. Continue Farxiga 10 mg daily. Continue monitoring BS's regularly. May need basal insulin. Follows with endocrinologist.    Atherosclerosis of aorta [facility]) Assessment & Plan: Continue Atorvastatin 40 mg daily. Aspirin is contraindicated due to PUD.   Essential hypertension Assessment & Plan: BP adequately controlled. Continue amlodipine 5 mg daily, metoprolol succinate 50 mg daily, and losartan-HCTZ 50-12.5 mg daily as well as law-salt diet. Continue monitoring BP at home.   Generalized osteoarthritis of multiple sites Assessment & Plan: Tramadol and Hydrocodone have not helped in the past. Topical Voltaren does not help. He understands NSAID's are contraindicated.   Stage 3b chronic kidney disease (HCC) Assessment & Plan: Last Cr 1.8 and e GFR 38 on 02/02/22. His endocrinologist is following renal function. He has appt with nephrologist.   Gastrointestinal hemorrhage associated with gastric ulcer Assessment & Plan: PUD. He has been advised against the use of NSAIDs. He has been counseled on the importance of avoiding these medications to prevent further complications.    I spent a total of 45 minutes in both face to face and non face to face activities for this visit on the date of this encounter. During this time history was obtained and documented, examination was performed,  prior labs reviewed, and assessment/plan discussed.  Return in about 18 weeks (around 06/15/2022) for chronic problems.  Mackensie Pilson G. [place], MD  [facility]. [place] office.

## 2022-02-09 ENCOUNTER — Encounter: Payer: Self-pay | Admitting: Family Medicine

## 2022-02-09 ENCOUNTER — Ambulatory Visit (INDEPENDENT_AMBULATORY_CARE_PROVIDER_SITE_OTHER): Payer: Medicare Other | Admitting: Family Medicine

## 2022-02-09 ENCOUNTER — Telehealth: Payer: Self-pay | Admitting: *Deleted

## 2022-02-09 VITALS — BP 120/62 | HR 94 | Temp 98.1°F | Resp 16 | Ht 67.0 in | Wt 157.2 lb

## 2022-02-09 DIAGNOSIS — I700 Atherosclerosis of aorta: Secondary | ICD-10-CM

## 2022-02-09 DIAGNOSIS — N1832 Chronic kidney disease, stage 3b: Secondary | ICD-10-CM | POA: Diagnosis not present

## 2022-02-09 DIAGNOSIS — G4700 Insomnia, unspecified: Secondary | ICD-10-CM | POA: Diagnosis not present

## 2022-02-09 DIAGNOSIS — K254 Chronic or unspecified gastric ulcer with hemorrhage: Secondary | ICD-10-CM

## 2022-02-09 DIAGNOSIS — E1122 Type 2 diabetes mellitus with diabetic chronic kidney disease: Secondary | ICD-10-CM | POA: Diagnosis not present

## 2022-02-09 DIAGNOSIS — I10 Essential (primary) hypertension: Secondary | ICD-10-CM | POA: Diagnosis not present

## 2022-02-09 DIAGNOSIS — M159 Polyosteoarthritis, unspecified: Secondary | ICD-10-CM

## 2022-02-09 MED ORDER — TRAZODONE HCL 50 MG PO TABS: 25.0000 mg | ORAL_TABLET | Freq: Every evening | ORAL | 1 refills | Status: DC | PRN

## 2022-02-09 NOTE — Assessment & Plan Note (Addendum)
Metformin and Trulicity have been held due to worsening Cr, last one 1.8. Continue Farxiga 10 mg daily. Continue monitoring BS's regularly. May need basal insulin. Follows with endocrinologist.

## 2022-02-09 NOTE — Assessment & Plan Note (Signed)
Continue Atorvastatin 40 mg daily. Aspirin is contraindicated due to PUD.

## 2022-02-09 NOTE — Assessment & Plan Note (Signed)
BP adequately controlled. Continue amlodipine 5 mg daily, metoprolol succinate 50 mg daily, and losartan-HCTZ 50-12.5 mg daily as well as law-salt diet. Continue monitoring BP at home.

## 2022-02-09 NOTE — Assessment & Plan Note (Signed)
Tramadol and Hydrocodone have not helped in the past. Topical Voltaren does not help. He understands NSAID's are contraindicated.

## 2022-02-09 NOTE — Telephone Encounter (Signed)
        Patient  visited [facility] on 01/31/2022  for hernia    Telephone encounter attempt :  1st  A HIPAA compliant voice message was left requesting a return call.  Instructed patient to call back at [phone]. [place] [phone] [address] , [place] [address] Email : [email or web address]

## 2022-02-09 NOTE — Patient Instructions (Addendum)
A few things to remember from today's visit:  If melatonin 15 mg doe snot help with sleep, you can try Trazodone 1/2-1 tab 30 min before bedtime. Daryl Webb could try decreasing Protonix to once daily.  No changes in rest.  If you need refills for medications you take chronically, please call your pharmacy. Do not use My Chart to request refills or for acute issues that need immediate attention. If you send a my chart message, it may take a few days to be addressed, specially if I am not in the office.  Please be sure medication list is accurate. If a new problem present, please set up appointment sooner than planned today.

## 2022-02-09 NOTE — Assessment & Plan Note (Signed)
Pending appt with nephrologist.

## 2022-02-11 DIAGNOSIS — D649 Anemia, unspecified: Secondary | ICD-10-CM | POA: Diagnosis not present

## 2022-02-11 DIAGNOSIS — E1122 Type 2 diabetes mellitus with diabetic chronic kidney disease: Secondary | ICD-10-CM | POA: Diagnosis not present

## 2022-02-11 NOTE — Assessment & Plan Note (Signed)
PUD. He has been advised against the use of NSAIDs. He has been counseled on the importance of avoiding these medications to prevent further complications.

## 2022-02-11 NOTE — Assessment & Plan Note (Signed)
Problem seems to be getting worse, OTC sleep aids have not help. A safer options is Melatonin 5-15 mg 2 hours before bedtime and with empty stomach. If this does not help, Trazodone 50 mg, started 1/2 tab around bedtime can be started. We discussed some side effects and the risk of interaction with some of her medications, Tramadol specially. Good sleep hygiene. His wife is now managing his medications.  Overall, his  recovery has been complicated by multiple health issues since 12/2020, including heart surgery (s/p TAVR), hernia repair, and PUD with GI bleed in 11/2021, which have impacted his overall well-being and recovery trajectory. He has been encouraged to gradually increase his activity level to aid in his recovery and improve sleep quality.

## 2022-02-12 ENCOUNTER — Ambulatory Visit: Payer: Medicare Other | Admitting: Dietician

## 2022-02-15 ENCOUNTER — Telehealth: Payer: Self-pay | Admitting: Family Medicine

## 2022-02-15 NOTE — Telephone Encounter (Signed)
Daryl Webb pt wife is calling and would like to know if it ok for her husband to get the latest covid vaccine and rsv due to his medical conditions

## 2022-02-16 ENCOUNTER — Telehealth: Payer: Self-pay | Admitting: Nurse Practitioner

## 2022-02-16 NOTE — Telephone Encounter (Signed)
Yes, he can go a head and take both vaccines. Thanks, BJ

## 2022-02-16 NOTE — Telephone Encounter (Signed)
Pt wife [name]  made aware of [name] and Dr. Rush Landmark recommendations: Pt was scheduled to see [name] on 03/08/2022 at 2:00 PM: [name] Made aware: [name]  verbalized understanding with all questions answered.

## 2022-02-16 NOTE — Telephone Encounter (Signed)
Dr. Rush Landmark, refer to office visit 01/04/2022. Patient was scheduled for an EGD/colonoscopy 04/06/2022. He is s/p right inguinal hernia repair 01/14/2022 with post op complications including altered mental status and abdominal wall hematoma seen in the ED 12/16 and 01/31/2022. Seen by general surgery for follow up 02/05/2022, at that time he was till having a lot of pain. Do you want patient to schedule a follow up appointment in office prior to his procedure date?

## 2022-02-16 NOTE — Telephone Encounter (Signed)
CKS, I agree, this makes sense. Let's plan for evaluation with you or myself before his procedures. Thanks. GM

## 2022-02-16 NOTE — Telephone Encounter (Signed)
I spoke with [name], she is aware of message below.

## 2022-02-17 DIAGNOSIS — E538 Deficiency of other specified B group vitamins: Secondary | ICD-10-CM | POA: Diagnosis not present

## 2022-02-18 DIAGNOSIS — N1832 Chronic kidney disease, stage 3b: Secondary | ICD-10-CM | POA: Diagnosis not present

## 2022-02-18 DIAGNOSIS — E1122 Type 2 diabetes mellitus with diabetic chronic kidney disease: Secondary | ICD-10-CM | POA: Diagnosis not present

## 2022-02-18 DIAGNOSIS — I129 Hypertensive chronic kidney disease with stage 1 through stage 4 chronic kidney disease, or unspecified chronic kidney disease: Secondary | ICD-10-CM | POA: Diagnosis not present

## 2022-02-18 DIAGNOSIS — Z87448 Personal history of other diseases of urinary system: Secondary | ICD-10-CM | POA: Diagnosis not present

## 2022-02-18 DIAGNOSIS — K922 Gastrointestinal hemorrhage, unspecified: Secondary | ICD-10-CM | POA: Diagnosis not present

## 2022-02-18 DIAGNOSIS — N189 Chronic kidney disease, unspecified: Secondary | ICD-10-CM | POA: Diagnosis not present

## 2022-02-26 ENCOUNTER — Encounter: Payer: Medicare Other | Admitting: Gastroenterology

## 2022-03-01 DIAGNOSIS — D0439 Carcinoma in situ of skin of other parts of face: Secondary | ICD-10-CM | POA: Diagnosis not present

## 2022-03-01 DIAGNOSIS — L821 Other seborrheic keratosis: Secondary | ICD-10-CM | POA: Diagnosis not present

## 2022-03-01 DIAGNOSIS — L820 Inflamed seborrheic keratosis: Secondary | ICD-10-CM | POA: Diagnosis not present

## 2022-03-01 DIAGNOSIS — Z85828 Personal history of other malignant neoplasm of skin: Secondary | ICD-10-CM | POA: Diagnosis not present

## 2022-03-01 DIAGNOSIS — L814 Other melanin hyperpigmentation: Secondary | ICD-10-CM | POA: Diagnosis not present

## 2022-03-01 DIAGNOSIS — L570 Actinic keratosis: Secondary | ICD-10-CM | POA: Diagnosis not present

## 2022-03-02 DIAGNOSIS — E1165 Type 2 diabetes mellitus with hyperglycemia: Secondary | ICD-10-CM | POA: Diagnosis not present

## 2022-03-02 DIAGNOSIS — E1122 Type 2 diabetes mellitus with diabetic chronic kidney disease: Secondary | ICD-10-CM | POA: Diagnosis not present

## 2022-03-02 DIAGNOSIS — N1832 Chronic kidney disease, stage 3b: Secondary | ICD-10-CM | POA: Diagnosis not present

## 2022-03-03 DIAGNOSIS — Z23 Encounter for immunization: Secondary | ICD-10-CM | POA: Diagnosis not present

## 2022-03-04 ENCOUNTER — Other Ambulatory Visit: Payer: Self-pay

## 2022-03-04 ENCOUNTER — Emergency Department (HOSPITAL_COMMUNITY)
Admission: EM | Admit: 2022-03-04 | Discharge: 2022-03-04 | Discharge status: Against Medical Advice | Payer: Medicare Other | Attending: Emergency Medicine | Admitting: Emergency Medicine

## 2022-03-04 DIAGNOSIS — R4182 Altered mental status, unspecified: Secondary | ICD-10-CM | POA: Diagnosis not present

## 2022-03-04 DIAGNOSIS — R509 Fever, unspecified: Secondary | ICD-10-CM | POA: Insufficient documentation

## 2022-03-04 DIAGNOSIS — Z5321 Procedure and treatment not carried out due to patient leaving prior to being seen by health care provider: Secondary | ICD-10-CM | POA: Insufficient documentation

## 2022-03-04 DIAGNOSIS — R739 Hyperglycemia, unspecified: Secondary | ICD-10-CM | POA: Diagnosis not present

## 2022-03-04 LAB — CBG MONITORING, ED: Glucose-Capillary: 316 mg/dL — ABNORMAL HIGH (ref 70–99)

## 2022-03-04 LAB — COMPREHENSIVE METABOLIC PANEL
ALT: 19 U/L (ref 0–44)
AST: 20 U/L (ref 15–41)
Albumin: 3.6 g/dL (ref 3.5–5.0)
Alkaline Phosphatase: 87 U/L (ref 38–126)
Anion gap: 10 (ref 5–15)
BUN: 37 mg/dL — ABNORMAL HIGH (ref 8–23)
CO2: 19 mmol/L — ABNORMAL LOW (ref 22–32)
Calcium: 8.6 mg/dL — ABNORMAL LOW (ref 8.9–10.3)
Chloride: 101 mmol/L (ref 98–111)
Creatinine, Ser: 1.73 mg/dL — ABNORMAL HIGH (ref 0.61–1.24)
GFR, Estimated: 40 mL/min — ABNORMAL LOW (ref >60)
Glucose, Bld: 296 mg/dL — ABNORMAL HIGH (ref 70–99)
Potassium: 4 mmol/L (ref 3.5–5.1)
Sodium: 130 mmol/L — ABNORMAL LOW (ref 135–145)
Total Bilirubin: 0.5 mg/dL (ref 0.3–1.2)
Total Protein: 7.3 g/dL (ref 6.5–8.1)

## 2022-03-04 NOTE — ED Triage Notes (Addendum)
Pt presents from home via EMS for AMS ([facility] 5pm, A&Ox3, slow to respond, some inappropriate responses, wife called EMS; A&Ox4 at baseline) and fever x3 days.  EMS VS: 101.52F (given tylenol '650mg'$ ), 144/86, 96%RA, 299CBG, Hr80s.   H/o gastric ulcers  Able to answer all orientations with exception of situation

## 2022-03-04 NOTE — ED Notes (Signed)
Pt advised registration staff that he was leaving. Triage RN notified.

## 2022-03-08 ENCOUNTER — Ambulatory Visit: Payer: Medicare Other | Admitting: Nurse Practitioner

## 2022-03-08 ENCOUNTER — Telehealth: Payer: Self-pay | Admitting: Nurse Practitioner

## 2022-03-08 DIAGNOSIS — Z794 Long term (current) use of insulin: Secondary | ICD-10-CM | POA: Diagnosis not present

## 2022-03-08 DIAGNOSIS — H25013 Cortical age-related cataract, bilateral: Secondary | ICD-10-CM | POA: Diagnosis not present

## 2022-03-08 DIAGNOSIS — H2513 Age-related nuclear cataract, bilateral: Secondary | ICD-10-CM | POA: Diagnosis not present

## 2022-03-08 DIAGNOSIS — E119 Type 2 diabetes mellitus without complications: Secondary | ICD-10-CM | POA: Diagnosis not present

## 2022-03-08 NOTE — Telephone Encounter (Signed)
Inbound call from patients wife seeking advice if patient needs to have lab work done before his appointment with Jaclyn Shaggy on 2/1. Please advise.

## 2022-03-08 NOTE — Telephone Encounter (Unsigned)
Left message for pt to call back  °

## 2022-03-09 ENCOUNTER — Ambulatory Visit (INDEPENDENT_AMBULATORY_CARE_PROVIDER_SITE_OTHER): Payer: Medicare Other | Admitting: Neurology

## 2022-03-09 ENCOUNTER — Encounter: Payer: Self-pay | Admitting: Neurology

## 2022-03-09 VITALS — BP 134/68 | HR 66 | Ht 67.0 in | Wt 160.0 lb

## 2022-03-09 DIAGNOSIS — G3184 Mild cognitive impairment, so stated: Secondary | ICD-10-CM | POA: Diagnosis not present

## 2022-03-09 MED ORDER — DONEPEZIL HCL 5 MG PO TABS: 5.0000 mg | ORAL_TABLET | Freq: Every day | ORAL | 0 refills | Status: DC

## 2022-03-09 NOTE — Progress Notes (Signed)
[facility]  PATIENT: Daryl Webb DOB: 1945-06-29  REQUESTING CLINICIAN: [name], [name], MD HISTORY FROM: Patient, wife and daughter  REASON FOR VISIT: Memory decline    HISTORICAL  CHIEF COMPLAINT:  Chief Complaint  Patient presents with   New Patient (Initial Visit)    Rm 13, with wife and daughter  Rm NP Internal referral for memory difficulties    HISTORY OF PRESENT ILLNESS:  This is a 77 year old gentleman past medical history of hypertension, hyperlipidemia, history of GI bleed, who is presenting with complaint of memory loss.  Patient noticed that he is more forgetful than before.  He reports that he can do something in the morning and forgets that in the afternoon.  Family, mainly daughter has reported the memory problem has been going on for the past year and a half and getting worse.  He is forgetful about recent conversation, he has been getting lost driving to familiar places and he is also repetitive meaning asked the same questions over and over. Wife reported history of GI bleed in October which require 2 endoscopies. In December he also had surgery, hernia repair and they feel since the endoscopies his memory has worsened.  He did have a CT scan in December which were negative for any acute stroke. There is also reports of sleep problems, difficulty falling asleep and frustration at the end of the day.     TBI:   No past history of TBI Stroke:   no past history of stroke Seizures:   no past history of seizures Sleep:   no history of sleep apnea.   Mood:  patient denies anxiety and depression Family history of Dementia:   Denies  Functional status: Dependent in some ADLs and IADLs Patient lives with spouse. Cooking: not much Cleaning: some Shopping: some Bathing: self, no help needed Toileting: self, no help needed Driving: Still drive but gets lots in familiar places  Bills: Trouble balancing check bookc  Medications: wife  helps Ever left the stove on by accident?: Denies  Forget how to use items around the house?: denies Getting lost going to familiar places?: Yes  Forgetting loved ones names?: Yes  Word finding difficulty? Yes  Sleep: Does not sleep    OTHER MEDICAL CONDITIONS: Hypertension, Hyperlipidemia, History of GI Bleed, anemia,    REVIEW OF SYSTEMS: Full 14 system review of systems performed and negative with exception of: as noted in the HPI   ALLERGIES: Allergies  Allergen Reactions   Aspirin Other (See Comments)    Bleeding ulcer   Diclofenac Other (See Comments)    Bleeding ulcer   Excedrin Tension Headache [Acetaminophen-Caffeine] Other (See Comments)    Bleeding ulcer   Motrin [Ibuprofen] Other (See Comments)    Bleeding ulcer    HOME MEDICATIONS: Outpatient Medications Prior to Visit  Medication Sig Dispense Refill   atorvastatin (LIPITOR) 40 MG tablet Take 1 tablet (40 mg total) by mouth daily. (Patient taking differently: Take 40 mg by mouth at bedtime.) 90 tablet 3   calcium carbonate (TUMS - DOSED IN MG ELEMENTAL CALCIUM) 500 MG chewable tablet Chew 2 tablets by mouth daily as needed for indigestion or heartburn.     Cholecalciferol (VITAMIN D3) 1000 units CAPS Take 2,000 Units by mouth daily. 1 capsule 0   Coenzyme Q10 (COQ10) 100 MG CAPS Take 100 mg by mouth daily.     cyanocobalamin (VITAMIN B12) 1000 MCG/ML injection Inject 1,000 mcg into the muscle every 30 (thirty) days.  FARXIGA 10 MG TABS tablet Take 10 mg by mouth in the morning.     ferrous gluconate (FERGON) 324 MG tablet Take 1 tablet (324 mg total) by mouth daily with breakfast. 30 tablet 3   Insulin Glargine (BASAGLAR KWIKPEN) 100 UNIT/ML 10 units (titrate as directed up to 50 units per day) Subcutaneous once a day for 30 days     losartan-hydrochlorothiazide (HYZAAR) 100-25 MG tablet Take 1 tablet by mouth daily.     metoprolol succinate (TOPROL-XL) 50 MG 24 hr tablet TAKE 1 TABLET DAILY 90 tablet 2   NON  FORMULARY Take 2 capsules by mouth See admin instructions. Vicks ZzzQuil Pure Zzzs Triple Action Gummies- Chew 2 gummies by mouth every night at bedtime     pantoprazole (PROTONIX) 40 MG tablet Take 1 tablet (40 mg total) by mouth 2 (two) times daily before a meal. 60 tablet 2   traZODone (DESYREL) 50 MG tablet Take 0.5-1 tablets (25-50 mg total) by mouth at bedtime as needed for sleep. 30 tablet 1   TYLENOL 500 MG tablet Take 1,000 mg by mouth every 6 (six) hours as needed for headache.     amLODipine (NORVASC) 5 MG tablet Take 1 tablet (5 mg total) by mouth daily. 90 tablet 2   losartan-hydrochlorothiazide (HYZAAR) 50-12.5 MG tablet TAKE 1 TABLET DAILY 90 tablet 1   No facility-administered medications prior to visit.    PAST MEDICAL HISTORY: Past Medical History:  Diagnosis Date   Acute blood loss anemia 06/27/2012   Anemia    Arthritis    CKD (chronic kidney disease)    Constipation due to pain medication    needs stool softner while on pain medication   Diabetes mellitus    Duodenal ulcer 08/13/2015   2014  - Dr Kaplan/GI   Duodenal ulcer with hemorrhage 08/13/2015   2014  - Dr Kaplan/GI   Dyspnea 06/19/2012   GERD (gastroesophageal reflux disease)    H/O: osteoarthritis    Heart murmur    History of kidney stones    History of prostate cancer    History of urethral stricture    Hypercholesterolemia    Hypertension    Melena 06/19/2012   PONV (postoperative nausea and vomiting)    Prostate cancer ([place])    prostate   S/P TAVR (transcatheter aortic valve replacement) 08/12/2020   s/p TAVR with a 29 mm Edwards Sapien 3 THV via the TF approach by [name] and [name]   Severe aortic stenosis    Spinal stenosis    Ulcer duodenal hemorrhage     PAST SURGICAL HISTORY: Past Surgical History:  Procedure Laterality Date   ARTHROPLASTY  2004   right knee [name] SURGERY     BIOPSY  11/21/2021   Procedure: BIOPSY;  Surgeon: Doran Stabler, MD;   Location: [facility];  Service: Gastroenterology;;   CARDIAC CATHETERIZATION     CARPAL TUNNEL RELEASE     right hand,wrist [name]   CYSTOSCOPY N/A 09/20/2012   Procedure: Consuela Mimes;  Surgeon: [name], MD;  Location: [facility];  Service: Urology;  Laterality: N/A;   ESOPHAGOGASTRODUODENOSCOPY N/A 11/21/2021   Procedure: ESOPHAGOGASTRODUODENOSCOPY (EGD);  Surgeon: Doran Stabler, MD;  Location: [facility];  Service: Gastroenterology;  Laterality: N/A;   ESOPHAGOGASTRODUODENOSCOPY (EGD) WITH PROPOFOL N/A 11/30/2021   Procedure: ESOPHAGOGASTRODUODENOSCOPY (EGD) WITH PROPOFOL;  Surgeon: Jerene Bears, MD;  Location: [facility];  Service: Gastroenterology;  Laterality: N/A;   HEMOSTASIS CLIP  PLACEMENT  11/30/2021   Procedure: HEMOSTASIS CLIP PLACEMENT;  Surgeon: Jerene Bears, MD;  Location: [facility];  Service: Gastroenterology;;   INGUINAL HERNIA REPAIR Right 01/14/2022   Procedure: RIGHT OPEN INGUINAL HERNIA REPAIR WITH MESH;  Surgeon: Kinsinger, Arta Bruce, MD;  Location: [facility];  Service: General;  Laterality: Right;   JOINT REPLACEMENT Right    KNEE ARTHROSCOPY Right    LUMBAR LAMINECTOMY  02/08/2013   PROSTATECTOMY     radical   RIGHT/LEFT HEART CATH AND CORONARY ANGIOGRAPHY N/A 05/08/2020   Procedure: RIGHT/LEFT HEART CATH AND CORONARY ANGIOGRAPHY;  Surgeon: [name], MD;  Location: [facility];  Service: Cardiovascular;  Laterality: N/A;   TEE WITHOUT CARDIOVERSION N/A 08/12/2020   Procedure: TRANSESOPHAGEAL ECHOCARDIOGRAM (TEE);  Surgeon: [name], MD;  Location: [facility];  Service: Open Heart Surgery;  Laterality: N/A;   TOOTH EXTRACTION  12/2021   TRANSCATHETER AORTIC VALVE REPLACEMENT, TRANSFEMORAL N/A 08/12/2020   Procedure: TRANSCATHETER AORTIC VALVE REPLACEMENT, TRANSFEMORAL;  Surgeon: [name], MD;  Location: [facility];  Service: Open Heart Surgery;  Laterality: N/A;   VASECTOMY  1985    FAMILY  HISTORY: Family History  Problem Relation Age of Onset   Heart disease Mother    Heart failure Mother    Heart disease Father    Diabetes Father     SOCIAL HISTORY: Social History   Socioeconomic History   Marital status: Married    Spouse name: Not on file   Number of children: 2   Years of education: 16   Highest education level: Bachelor's degree (e.g., BA, AB, BS)  Occupational History   Occupation: Retired  Tobacco Use   Smoking status: Former    Packs/day: 1.50    Years: 20.00    Total pack years: 30.00    Types: Cigarettes    Quit date: 02/09/1980    Years since quitting: 42.1   Smokeless tobacco: Never  Vaping Use   Vaping Use: Never used  Substance and Sexual Activity   Alcohol use: No   Drug use: No   Sexual activity: Not Currently  Other Topics Concern   Not on file  Social History Narrative   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    Lives with wife,    two children in [place].    Enjoys reading, boating, travelling to [place] in the winter with wife.   Social Determinants of Health   Financial Resource Strain: Low Risk  (12/24/2021)   Overall Financial Resource Strain (CARDIA)    Difficulty of Paying Living Expenses: Not hard at all  Food Insecurity: No Food Insecurity (12/24/2021)   Hunger Vital Sign    Worried About Running Out of Food in the Last Year: Never true    Ran Out of Food in the Last Year: Never true  Transportation Needs: No Transportation Needs (12/24/2021)   PRAPARE - Hydrologist (Medical): No    Lack of Transportation (Non-Medical): No  Physical Activity: Inactive (12/24/2021)   Exercise Vital Sign    Days of Exercise per Week: 0 days    Minutes of Exercise per Session: 0 min  Stress: No Stress Concern Present (12/24/2021)   [place]    Feeling of Stress : Not at all  Social Connections: Moderately Isolated (12/24/2021)   Social  Connection and Isolation Panel [NHANES]    Frequency of Communication with Friends and Family: More than three  times a week    Frequency of Social Gatherings with Friends and Family: More than three times a week    Attends Religious Services: Never    Marine scientist or Organizations: No    Attends Archivist Meetings: Never    Marital Status: Married  Human resources officer Violence: Not At Risk (12/24/2021)   Humiliation, Afraid, Rape, and Kick questionnaire    Fear of Current or Ex-Partner: No    Emotionally Abused: No    Physically Abused: No    Sexually Abused: No     PHYSICAL EXAM  GENERAL EXAM/CONSTITUTIONAL: Vitals:  Vitals:   03/09/22 1515  BP: 134/68  Pulse: 66  Weight: 160 lb (72.6 kg)  Height: '5\' 7"'$  (1.702 m)   Body mass index is 25.06 kg/m. Wt Readings from Last 3 Encounters:  03/09/22 160 lb (72.6 kg)  03/04/22 156 lb 8.4 oz (71 kg)  02/09/22 157 lb 4 oz (71.3 kg)   Patient is in no distress; well developed, nourished and groomed; neck is supple   EYES: Visual fields full to confrontation, Extraocular movements intacts,   MUSCULOSKELETAL: Gait, strength, tone, movements noted in Neurologic exam below  NEUROLOGIC: MENTAL STATUS:     02/20/2018    1:41 PM  MMSE - Mini Mental State Exam  Orientation to time 5  Orientation to Place 5  Registration 3  Attention/ Calculation 5  Recall 3  Language- name 2 objects 2  Language- repeat 1  Language- follow 3 step command 3  Language- read & follow direction 1  Write a sentence 1  Copy design 1  Total score 30      03/09/2022    3:20 PM  Montreal Cognitive Assessment   Visuospatial/ Executive (0/5) 4  Naming (0/3) 3  Attention: Read list of digits (0/2) 2  Attention: Read list of letters (0/1) 1  Attention: Serial 7 subtraction starting at 100 (0/3) 3  Language: Repeat phrase (0/2) 2  Language : Fluency (0/1) 1  Abstraction (0/2) 1  Delayed Recall (0/5) 0  Orientation (0/6) 6   Total 23     CRANIAL NERVE:  2nd, 3rd, 4th, 6th- visual fields full to confrontation, extraocular muscles intact, no nystagmus 5th - facial sensation symmetric 7th - facial strength symmetric 8th - hearing intact 9th - palate elevates symmetrically, uvula midline 11th - shoulder shrug symmetric 12th - tongue protrusion midline  MOTOR:  normal bulk and tone, full strength in the BUE, BLE  SENSORY:  normal and symmetric to light touch  COORDINATION:  finger-nose-finger, fine finger movements normal  GAIT/STATION:  Antalgic gait    DIAGNOSTIC DATA (LABS, IMAGING, TESTING) - I reviewed patient records, labs, notes, testing and imaging myself where available.  Lab Results  Component Value Date   WBC 7.9 01/31/2022   HGB 12.2 (L) 01/31/2022   HCT 36.0 (L) 01/31/2022   MCV 88.4 01/31/2022   PLT 251 01/31/2022      Component Value Date/Time   NA 130 (L) 03/04/2022 0226   NA 140 05/02/2020 0000   K 4.0 03/04/2022 0226   CL 101 03/04/2022 0226   CO2 19 (L) 03/04/2022 0226   GLUCOSE 296 (H) 03/04/2022 0226   BUN 37 (H) 03/04/2022 0226   BUN 25 05/02/2020 0000   CREATININE 1.73 (H) 03/04/2022 0226   CREATININE 1.57 (H) 11/19/2019 1031   CALCIUM 8.6 (L) 03/04/2022 0226   PROT 7.3 03/04/2022 0226   ALBUMIN 3.6 03/04/2022 0226   AST 20  03/04/2022 0226   ALT 19 03/04/2022 0226   ALKPHOS 87 03/04/2022 0226   BILITOT 0.5 03/04/2022 0226   GFRNONAA 40 (L) 03/04/2022 0226   GFRNONAA 43 (L) 11/19/2019 1031   GFRAA 50 (L) 11/19/2019 1031   Lab Results  Component Value Date   CHOL 94 06/28/2017   HDL 27.90 (L) 06/28/2017   LDLCALC 43 06/28/2017   LDLDIRECT 73.8 11/20/2012   TRIG 112.0 06/28/2017   CHOLHDL 3 06/28/2017   Lab Results  Component Value Date   HGBA1C 7.2 (H) 11/23/2021   Lab Results  Component Value Date   VITAMINB12 246 11/21/2021   Lab Results  Component Value Date   TSH 3.28 12/09/2021    Head CT 01/31/2022 Periventricular white matter  changes consistent with chronic small vessel ischemia. Chronic left-sided sinusitis. No acute intracranial process identified   ASSESSMENT AND PLAN  77 y.o. year old male with history of hypertension, hyperlipidemia, anemia and GI bleed who is presenting with memory deficit described as being repetitive, forgetful, getting lost while driving in familiar places.  He also had difficulty balancing his checkbook.  On exam today he scored 23 out of 30 on the MoCA indicative of impairment.  I have informed patient that he likely has mild cognitive impairment, I will start him on Aricept 5 mg nightly, discussed side effect including dizziness, diarrhea and vivid dreams.  They will call to update me.  I will also obtain the ATN profile to look for Alzheimer biomarker.  Will refer him for a formal neuropsychological testing and I will see him in 1 year for follow-up or sooner if worse.  We also discussed ways to reduce the risk of Alzheimer disease including exercise, keeping a good sleep, good diet and overall good health.   1. Mild cognitive impairment      Patient Instructions  Start with Aricept 5 mg nightly, discussed side effect including diarrhea, vivid dreams and dizziness.  I will start him with 30 tablets, they will contact me for refill if he is able to tolerate the medicine. ATN profile to look for Alzheimer biomarker Referral for formal neuropsychological testing Increase exercise, walking, at least 30 minutes a day 5 days a week Continue follow-up with your PCP Return in 1 year or sooner if worse.    There are well-accepted and sensible ways to reduce risk for Alzheimers disease and other degenerative brain disorders .  Exercise Daily Walk A daily 20 minute walk should be part of your routine. Disease related apathy can be a significant roadblock to exercise and the only way to overcome this is to make it a daily routine and perhaps have a reward at the end (something your loved one  loves to eat or drink perhaps) or a personal trainer coming to the home can also be very useful. Most importantly, the patient is much more likely to exercise if the caregiver / spouse does it with him/her. In general a structured, repetitive schedule is best.  General Health: Any diseases which effect your body will effect your brain such as a pneumonia, urinary infection, blood clot, heart attack or stroke. Keep contact with your primary care doctor for regular follow ups.  Sleep. A good nights sleep is healthy for the brain. Seven hours is recommended. If you have insomnia or poor sleep habits we can give you some instructions. If you have sleep apnea wear your mask.  Diet: Eating a heart healthy diet is also a good idea; fish and poultry  instead of red meat, nuts (mostly non-peanuts), vegetables, fruits, olive oil or canola oil (instead of butter), minimal salt (use other spices to flavor foods), whole grain rice, bread, cereal and pasta and wine in moderation.Research is now showing that the MIND diet, which is a combination of The Mediterranean diet and the DASH diet, is beneficial for cognitive processing and longevity. Information about this diet can be found in The MIND Diet, a book by Doyne Keel, MS, RDN, and online at NotebookDistributors.si  Finances, Power of Attorney and Advance Directives: You should consider putting legal safeguards in place with regard to financial and medical decision making. While the spouse always has power of attorney for medical and financial issues in the absence of any form, you should consider what you want in case the spouse / caregiver is no longer around or capable of making decisions.   Orders Placed This Encounter  Procedures   ATN PROFILE    Meds ordered this encounter  Medications   donepezil (ARICEPT) 5 MG tablet    Sig: Take 1 tablet (5 mg total) by mouth at bedtime.    Dispense:  30 tablet    Refill:  0    Return in  about 1 year (around 03/10/2023).  I have spent a total of 75 minutes dedicated to this patient today, preparing to see patient, performing a medically appropriate examination and evaluation, ordering tests and/or medications and procedures, and counseling and educating the patient/family/caregiver; independently interpreting result and communicating results to the family/patient/caregiver; and documenting clinical information in the electronic medical record.   Alric Ran, MD 03/09/2022, 5:35 PM  [facility] [address] [place], [place] [address] [phone]

## 2022-03-09 NOTE — Telephone Encounter (Signed)
Spoke with pt wife [name]. [name]  questioned if pt needed labs prior to office visit: [name] was made  aware that after the provider does a complete assessment and evaluation at the office visit then they will order the labs that they deem necessary and the pt can go directly to the lab downstairs in our office building. Pt [name] verbalized understanding with all questions answered.

## 2022-03-09 NOTE — Patient Instructions (Addendum)
Start with Aricept 5 mg nightly, discussed side effect including diarrhea, vivid dreams and dizziness.  I will start him with 30 tablets, they will contact me for refill if he is able to tolerate the medicine. ATN profile to look for Alzheimer biomarker Referral for formal neuropsychological testing Increase exercise, walking, at least 30 minutes a day 5 days a week Continue follow-up with your PCP Return in 1 year or sooner if worse.    There are well-accepted and sensible ways to reduce risk for Alzheimers disease and other degenerative brain disorders .  Exercise Daily Walk A daily 20 minute walk should be part of your routine. Disease related apathy can be a significant roadblock to exercise and the only way to overcome this is to make it a daily routine and perhaps have a reward at the end (something your loved one loves to eat or drink perhaps) or a personal trainer coming to the home can also be very useful. Most importantly, the patient is much more likely to exercise if the caregiver / spouse does it with him/her. In general a structured, repetitive schedule is best.  General Health: Any diseases which effect your body will effect your brain such as a pneumonia, urinary infection, blood clot, heart attack or stroke. Keep contact with your primary care doctor for regular follow ups.  Sleep. A good nights sleep is healthy for the brain. Seven hours is recommended. If you have insomnia or poor sleep habits we can give you some instructions. If you have sleep apnea wear your mask.  Diet: Eating a heart healthy diet is also a good idea; fish and poultry instead of red meat, nuts (mostly non-peanuts), vegetables, fruits, olive oil or canola oil (instead of butter), minimal salt (use other spices to flavor foods), whole grain rice, bread, cereal and pasta and wine in moderation.Research is now showing that the MIND diet, which is a combination of The Mediterranean diet and the DASH diet, is  beneficial for cognitive processing and longevity. Information about this diet can be found in The MIND Diet, a book by Doyne Keel, MS, RDN, and online at [employer]bookDist[employer].si  Finances, Power of Attorney and Advance Directives: You should consider putting legal safeguards in place with regard to financial and medical decision making. While the spouse always has power of attorney for medical and financial issues in the absence of any form, you should consider what you want in case the spouse / caregiver is no longer around or capable of making decisions.

## 2022-03-10 ENCOUNTER — Telehealth: Payer: Self-pay | Admitting: Internal Medicine

## 2022-03-10 NOTE — Telephone Encounter (Signed)
*  STAT* If patient is at the pharmacy, call can be transferred to refill team.   1. Which medications need to be refilled? (please list name of each medication and dose if known)    metoprolol succinate (TOPROL-XL) 50 MG 24 hr tablet    2. Which pharmacy/location (including street and city if local pharmacy) is medication to be sent to?  [facility] [place] [place] [facility] [address] AT [address]AL TO [facility]ED [place]    3. Do they need a 30 day or 90 day supply? [place]

## 2022-03-11 ENCOUNTER — Ambulatory Visit (INDEPENDENT_AMBULATORY_CARE_PROVIDER_SITE_OTHER): Payer: Medicare Other | Admitting: Nurse Practitioner

## 2022-03-11 ENCOUNTER — Encounter: Payer: Self-pay | Admitting: Nurse Practitioner

## 2022-03-11 ENCOUNTER — Other Ambulatory Visit (INDEPENDENT_AMBULATORY_CARE_PROVIDER_SITE_OTHER): Payer: Medicare Other

## 2022-03-11 VITALS — BP 110/62 | HR 70 | Ht 63.0 in | Wt 158.1 lb

## 2022-03-11 DIAGNOSIS — Z1211 Encounter for screening for malignant neoplasm of colon: Secondary | ICD-10-CM | POA: Diagnosis not present

## 2022-03-11 DIAGNOSIS — D509 Iron deficiency anemia, unspecified: Secondary | ICD-10-CM

## 2022-03-11 DIAGNOSIS — K259 Gastric ulcer, unspecified as acute or chronic, without hemorrhage or perforation: Secondary | ICD-10-CM

## 2022-03-11 LAB — BASIC METABOLIC PANEL
BUN: 36 mg/dL — ABNORMAL HIGH (ref 6–23)
CO2: 26 mEq/L (ref 19–32)
Calcium: 9.1 mg/dL (ref 8.4–10.5)
Chloride: 101 mEq/L (ref 96–112)
Creatinine, Ser: 1.86 mg/dL — ABNORMAL HIGH (ref 0.40–1.50)
GFR: 34.6 mL/min — ABNORMAL LOW (ref >60.00)
Glucose, Bld: 360 mg/dL — ABNORMAL HIGH (ref 70–99)
Potassium: 4.3 mEq/L (ref 3.5–5.1)
Sodium: 135 mEq/L (ref 135–145)

## 2022-03-11 LAB — IBC + FERRITIN
Ferritin: 39.5 ng/mL (ref 22.0–322.0)
Iron: 170 ug/dL — ABNORMAL HIGH (ref 42–165)
Saturation Ratios: 56.5 % — ABNORMAL HIGH (ref 20.0–50.0)
TIBC: 301 ug/dL (ref 250.0–450.0)
Transferrin: 215 mg/dL (ref 212.0–360.0)

## 2022-03-11 LAB — CBC
HCT: 40.3 % (ref 39.0–52.0)
Hemoglobin: 12.8 g/dL — ABNORMAL LOW (ref 13.0–17.0)
MCHC: 31.7 g/dL (ref 30.0–36.0)
MCV: 88 fl (ref 78.0–100.0)
Platelets: 253 10*3/uL (ref 150.0–400.0)
RBC: 4.58 Mil/uL (ref 4.22–5.81)
RDW: 22.3 % — ABNORMAL HIGH (ref 11.5–15.5)
WBC: 5.6 10*3/uL (ref 4.0–10.5)

## 2022-03-11 MED ORDER — METOPROLOL SUCCINATE ER 50 MG PO TB24: 50.0000 mg | ORAL_TABLET | Freq: Every day | ORAL | 0 refills | Status: DC

## 2022-03-11 NOTE — Progress Notes (Unsigned)
03/11/2022 Daryl Webb [id number] May 15, 1945   Chief Complaint: Discuss upcoming EGD/colonoscopy   History of Present Illness:  Daryl Webb is a 77 year old male with a past medical history significant hypertension, hyperlipidemia, coronary artery disease, severe aortic stenosis status post TAVR 08/13/2020, diabetes mellitus type 2, CKD stage III, CKD stage III, prostate cancer, inguinal hernia, hyperplastic colon polyp 2012, duodenal ulcer in 2012 with UGI bleed secondary to a duodenal ulcer 11/2021.   Refer to office visit 01/04/2022 for comprehensive history review.  At that time, he presented for a follow-up after being admitted to the hospital x 09 Nov 2021 with a upper GI bleed secondary to a duodenal ulcer with associated anemia.  A repeat EGD to assess for ulcer healing and a colonoscopy were recommended January 2024.  He underwent right inguinal hernia surgery 01/14/2022, seen in the ED 12/16 and 01/31/2022 with confusion and abdominal pain secondary to the development of a post operative right abdominal wall hematoma.   A follow up GI appointment was recommended prior to proceeding with an EGD and colonoscopy. He presents today accompanied by his wife. He reports feeling well at this time. No further abdominal pain. His wife reported his cognitive impairment remains a concern. He was seen by neurology, undergoing evaluation for Alzheimer's disease. She is concerned her husband won't be able to complete the bowel prep, at risk for hypoglycemia and renal dysfunction. She is also concerned regarding the effects of any sedation used during any endoscopic procedure in the setting of her husband's cognitive impairment, possibly exacerbated by recent general anesthesia. She wishes to pursue a Cologuard test and not a colonoscopy and delay an EGD for now, her husband agrees. History of a hyperplastic colon polyp 03/2010. He remains on Pantoprazole '40mg'$  bid and Ferrous Sulfate '325mg'$   one po QD. Stable Hg 12.8.      Latest Ref Rng & Units 03/11/2022    2:44 PM 01/31/2022    2:00 PM 01/31/2022    1:52 PM  CBC  WBC 4.0 - 10.5 K/uL 5.6   7.9   Hemoglobin 13.0 - 17.0 g/dL 12.8  12.2  10.8   Hematocrit 39.0 - 52.0 % 40.3  36.0  34.9   Platelets 150.0 - 400.0 K/uL 253.0   251         Latest Ref Rng & Units 03/11/2022    2:44 PM 03/04/2022    2:26 AM 01/31/2022    2:00 PM  CMP  Glucose 70 - 99 mg/dL 360  296  139   BUN 6 - 23 mg/dL 36  37  38   Creatinine 0.40 - 1.50 mg/dL 1.86  1.73  1.80   Sodium 135 - 145 mEq/L 135  130  138   Potassium 3.5 - 5.1 mEq/L 4.3  4.0  4.4   Chloride 96 - 112 mEq/L 101  101  102   CO2 19 - 32 mEq/L 26  19    Calcium 8.4 - 10.5 mg/dL 9.1  8.6    Total Protein 6.5 - 8.1 g/dL  7.3    Total Bilirubin 0.3 - 1.2 mg/dL  0.5    Alkaline Phos 38 - 126 U/L  87    AST 15 - 41 U/L  20    ALT 0 - 44 U/L  19       EGD 11/21/2021 by Dr. [name]: - Normal larynx. - Normal esophagus. - Normal larynx. Inpatient - Normal esophagus. - Normal stomach. Biopsied. -  Non-bleeding duodenal ulcer with pigmented material. A. STOMACH, BIOPSY:  -  Antral/oxyntic mucosa with mild to moderate chronic inactive  gastritis.  -An immunohistochemical stain is negative for Helicobacter pylori  organisms.    EGD 11/30/2021 by Dr. Hilarie Fredrickson: - Normal esophagus. - Clotted blood in the gastric body. Cleared and found to be refluxing from active duodenal bulb ulcer bleed. - Mostly healed gastric biopsy sites without bleeding. - Bleeding duodenal ulcers with a visible vessels. Clips (MR conditional) were placed. - Normal second portion of the duodenum. - No specimens collected. - Continue present medications including PPI infusion at least until tomorrow. Once stopped change back to PO BID for 12 weeks then once daily thereafter.  Carafate before every meal and nightly for 4 weeks. Remain off aspirin and no NSAIDs.   Colonoscopy 03/31/2010: 42m hyperplastic polyp  removed from the sigmoid colon. Recall colonoscopy 10 years   EGD 06/28/2012: 3-4 mm clean based to the duodenal bulb Path - chronic duodenitis . No H.pylori   Current Outpatient Medications on File Prior to Visit  Medication Sig Dispense Refill   atorvastatin (LIPITOR) 40 MG tablet Take 1 tablet (40 mg total) by mouth daily. (Patient taking differently: Take 40 mg by mouth at bedtime.) 90 tablet 3   calcium carbonate (TUMS - DOSED IN MG ELEMENTAL CALCIUM) 500 MG chewable tablet Chew 2 tablets by mouth daily as needed for indigestion or heartburn.     Cholecalciferol (VITAMIN D3) 1000 units CAPS Take 2,000 Units by mouth daily. 1 capsule 0   Coenzyme Q10 (COQ10) 100 MG CAPS Take 100 mg by mouth daily.     cyanocobalamin (VITAMIN B12) 1000 MCG/ML injection Inject 1,000 mcg into the muscle every 30 (thirty) days.     donepezil (ARICEPT) 5 MG tablet Take 1 tablet (5 mg total) by mouth at bedtime. 30 tablet 0   FARXIGA 10 MG TABS tablet Take 10 mg by mouth in the morning.     ferrous gluconate (FERGON) 324 MG tablet Take 1 tablet (324 mg total) by mouth daily with breakfast. 30 tablet 3   Insulin Glargine (BASAGLAR KWIKPEN) 100 UNIT/ML 10 units (titrate as directed up to 50 units per day) Subcutaneous once a day for 30 days     losartan-hydrochlorothiazide (HYZAAR) 100-25 MG tablet Take 1 tablet by mouth daily.     metoprolol succinate (TOPROL-XL) 50 MG 24 hr tablet Take 1 tablet (50 mg total) by mouth daily. Take with or immediately following a meal. 60 tablet 0   NON FORMULARY Take 2 capsules by mouth See admin instructions. Vicks ZzzQuil Pure Zzzs Triple Action Gummies- Chew 2 gummies by mouth every night at bedtime     pantoprazole (PROTONIX) 40 MG tablet Take 1 tablet (40 mg total) by mouth 2 (two) times daily before a meal. 60 tablet 2   traZODone (DESYREL) 50 MG tablet Take 0.5-1 tablets (25-50 mg total) by mouth at bedtime as needed for sleep. 30 tablet 1   TYLENOL 500 MG tablet Take 1,000  mg by mouth every 6 (six) hours as needed for headache.     No current facility-administered medications on file prior to visit.   Allergies  Allergen Reactions   Aspirin Other (See Comments)    Bleeding ulcer   Diclofenac Other (See Comments)    Bleeding ulcer   Excedrin Tension Headache [Acetaminophen-Caffeine] Other (See Comments)    Bleeding ulcer   Motrin [Ibuprofen] Other (See Comments)    Bleeding ulcer    Current Medications,  Allergies, Past Medical History, Past Surgical History, Family History and Social History were reviewed in [employer] record.  Review of Systems:   Constitutional: Negative for fever, sweats, chills or weight loss.  Respiratory: Negative for shortness of breath.   Cardiovascular: Negative for chest pain, palpitations and leg swelling.  Gastrointestinal: See HPI.  Musculoskeletal: Negative for back pain or muscle aches.  Neurological: + Cognitive impairment.  Physical Exam: BP 110/62   Pulse 70   Ht '5\' 3"'$  (1.6 m)   Wt 158 lb 2 oz (71.7 kg)   BMI 28.01 kg/m  General: Pleasant 77 year old male in no acute distress. Head: Normocephalic and atraumatic. Eyes: No scleral icterus. Conjunctiva pink . Ears: Normal auditory acuity. Mouth: Dentition intact. No ulcers or lesions.  Lungs: Clear throughout to auscultation. Heart: Regular rate and rhythm, systolic murmur. Abdomen: Soft, nontender and nondistended. No masses or hepatomegaly. Normal bowel sounds x 4 quadrants.  Rectal: Deferred.  Musculoskeletal: Symmetrical with no gross deformities. Extremities: No edema. Neurological: Alert oriented x 4. No focal deficits.  Psychological: Alert and cooperative. Normal mood and affect  Assessment and Recommendations:  59) 77 year old male with admitted to the hospital x 09 Nov 2021 secondary to UGI bleed d/t a bleeding duodenal ulcer with associated anemia. S/P EGD 10/14 and 10/23, clip placed to duodenal  bulb with visible vessel.  No further melena. Hg stable.  -Cancel 03/2022 EGD, patient to follow up in office in a few months to re-discuss timing of follow up EGD -Continue Pantoprazole '40mg'$  bid  -Continue Ferrous Sulfate '325mg'$  one po QD -Patient/wife to contact office if black stools or rectal bleeding occurs  -Repeat CBC and iron panel in 6 weeks  2) S/P right inguinal hernia repair 85/63/1497 complicated by a post operative abdomina wall hematoma   3) History of a hyperplastic colon polyp per colonoscopy in 2012 -Cancel colonoscopy scheduled 03/2022 -Patient/wife do not wish to purse a colonoscopy secondary to concerns regarding intolerance to bowel prep, risk of hypoglycemia on clear liquid diet day before procedure and concerns regarding sedation with progressive cognitive impairment. Patient/wife wish to pursue a Cologuard test -Cologuard test   4) History of CAD. History of severe aortic stenosis S/P TAVR 08/2020.  5) Cognitive impairment on Aricept

## 2022-03-11 NOTE — Patient Instructions (Addendum)
Your provider has requested that you go to the basement level for lab work before leaving today. Press "B" on the elevator. The lab is located at the first door on the left as you exit the elevator.  Continue Pantoprazole 40 mg - Take 1 pill by mouth twice daily.    Your provider has ordered Cologuard testing as an option for colon cancer screening. This is performed by [employer] and may be out of network with your insurance. PRIOR to completing the test, it is YOUR responsibility to contact your insurance about covered benefits for this test. Your out of pocket expense could be anywhere from $0.00 to $649.00.   When you call to check coverage with your insurer, please provide the following information:   -The ONLY provider of Cologuard is [place] code for Cologuard is [phone].  [employer] NPI # [id number]  -[employer] Tax ID # [id number]   We have already sent your demographic and insurance information to [employer] (phone number [phone]) and they should contact you within the next week regarding your test. If you have not heard from them within the next week, please call our office at [phone].    You have been scheduled for an endoscopy. Please follow written instructions given to you at your visit today. If you use inhalers (even only as needed), please bring them with you on the day of your procedure.  _______________________________________________________  If your blood pressure at your visit was 140/90 or greater, please contact your primary care physician to follow up on this.  _______________________________________________________  If you are age 77 or older, your body mass index should be between 23-30. Your Body mass index is 28.01 kg/m. If this is out of the aforementioned range listed, please consider follow up with your Primary Care Provider.  If you are age 77 or younger, your body mass index  should be between 19-25. Your Body mass index is 28.01 kg/m. If this is out of the aformentioned range listed, please consider follow up with your Primary Care Provider.   ________________________________________________________  The [facility] providers would like to encourage you to use [facility] to communicate with providers for non-urgent requests or questions.  Due to long hold times on the telephone, sending your provider a message by [facility] may be a faster and more efficient way to get a response.  Please allow 48 business hours for a response.  Please remember that this is for non-urgent requests.  _______________________________________________________  Due to recent changes in healthcare laws, you may see the results of your imaging and laboratory studies on MyChart before your provider has had a chance to review them.  We understand that in some cases there may be results that are confusing or concerning to you. Not all laboratory results come back in the same time frame and the provider may be waiting for multiple results in order to interpret others.  Please give [place] 48 hours in order for your provider to thoroughly review all the results before contacting the office for clarification of your results.   Thank you for choosing me and [facility].  [place]

## 2022-03-12 LAB — ATN PROFILE
A -- Beta-amyloid 42/40 Ratio: 0.101 — ABNORMAL LOW (ref >0.102)
Beta-amyloid 40: 383.86 pg/mL
Beta-amyloid 42: 38.76 pg/mL
N -- NfL, Plasma: 6.75 pg/mL (ref 0.00–7.64)
T -- p-tau181: 1.79 pg/mL — ABNORMAL HIGH (ref 0.00–0.97)

## 2022-03-13 NOTE — Progress Notes (Signed)
Attending Physician's Attestation   I have reviewed the chart.   I agree with the Advanced Practitioner's note, impression, and recommendations with any updates as below.    Seanna Sisler Mansouraty, MD [facility] Advanced Endoscopy Office # [phone]  

## 2022-03-15 ENCOUNTER — Other Ambulatory Visit: Payer: Self-pay

## 2022-03-15 ENCOUNTER — Encounter (HOSPITAL_BASED_OUTPATIENT_CLINIC_OR_DEPARTMENT_OTHER): Payer: Self-pay | Admitting: Internal Medicine

## 2022-03-15 ENCOUNTER — Other Ambulatory Visit: Payer: Self-pay | Admitting: Internal Medicine

## 2022-03-15 DIAGNOSIS — D509 Iron deficiency anemia, unspecified: Secondary | ICD-10-CM

## 2022-03-15 MED ORDER — METOPROLOL SUCCINATE ER 50 MG PO TB24: 50.0000 mg | ORAL_TABLET | Freq: Every day | ORAL | 0 refills | Status: DC

## 2022-03-16 ENCOUNTER — Encounter: Payer: Self-pay | Admitting: Gastroenterology

## 2022-03-17 ENCOUNTER — Other Ambulatory Visit: Payer: Self-pay

## 2022-03-17 ENCOUNTER — Encounter: Payer: Self-pay | Admitting: Family Medicine

## 2022-03-17 ENCOUNTER — Telehealth: Payer: Self-pay

## 2022-03-17 DIAGNOSIS — N1832 Chronic kidney disease, stage 3b: Secondary | ICD-10-CM | POA: Diagnosis not present

## 2022-03-17 DIAGNOSIS — D509 Iron deficiency anemia, unspecified: Secondary | ICD-10-CM

## 2022-03-17 DIAGNOSIS — K259 Gastric ulcer, unspecified as acute or chronic, without hemorrhage or perforation: Secondary | ICD-10-CM

## 2022-03-17 NOTE — Telephone Encounter (Signed)
Pt wife [name]  made aware of [name] and Dr. Rush Landmark recommendations. Order for labs placed in [place]. Pt notified to come to the lab in 6 weeks.  Reminder placed in Epic to remind pt wife to come to lab.  Pt scheduled for a follow up appointment on 05/26/2022 at 10:30 AM with Dr. Rush Landmark.  [name]  made aware.  [name] verbalized understanding with all questions answered.

## 2022-03-17 NOTE — Telephone Encounter (Signed)
Message Received: 4 days ago Noralyn Pick, NP  Mansouraty, Telford Nab., MD; Gillermina Hu, RN Remo Lipps, pls enter a lab order for a CBC, BMP, IBC and ferritin level to be done in 6 weeks. Pls contact wife in 5 weeks or so to remind patient to do. Pls schedule patient for a follow up appt with Dr. Rush Landmark in 2 to 3 months. THX

## 2022-03-17 NOTE — Telephone Encounter (Signed)
Patient returned phone call from [place].

## 2022-03-25 DIAGNOSIS — Z1211 Encounter for screening for malignant neoplasm of colon: Secondary | ICD-10-CM | POA: Diagnosis not present

## 2022-03-29 ENCOUNTER — Other Ambulatory Visit: Payer: Self-pay | Admitting: Neurology

## 2022-03-30 ENCOUNTER — Telehealth: Payer: Self-pay

## 2022-03-30 ENCOUNTER — Ambulatory Visit (HOSPITAL_COMMUNITY): Payer: Medicare Other | Attending: Cardiology

## 2022-03-30 DIAGNOSIS — Z952 Presence of prosthetic heart valve: Secondary | ICD-10-CM | POA: Insufficient documentation

## 2022-03-30 LAB — ECHOCARDIOGRAM COMPLETE
AR max vel: 1.69 cm2
AV Area VTI: 1.84 cm2
AV Area mean vel: 1.8 cm2
AV Mean grad: 8 mmHg
AV Peak grad: 18.9 mmHg
Ao pk vel: 2.18 m/s
Area-P 1/2: 2.81 cm2
S' Lateral: 3.4 cm

## 2022-03-30 MED ORDER — DONEPEZIL HCL 5 MG PO TABS: 5.0000 mg | ORAL_TABLET | Freq: Every day | ORAL | 0 refills | Status: DC

## 2022-03-30 NOTE — Telephone Encounter (Signed)
Reaching out for update on aricept for refill

## 2022-03-30 NOTE — Addendum Note (Signed)
Addended by: Kristen Loader on: 03/30/2022 01:32 PM   Modules accepted: Orders

## 2022-04-05 ENCOUNTER — Telehealth: Payer: Self-pay

## 2022-04-05 LAB — COLOGUARD: COLOGUARD: NEGATIVE

## 2022-04-05 NOTE — Telephone Encounter (Signed)
I received a call from Mr. Fullingtons wife regarding his continued trouble sleeping. He has had trouble falling asleep and staying asleep for a couple months to one year. He is currently taking Melatonin which has not been as effective and does have a prescription for Trazodone 50 mg tablet - prescribed by Dr [name] to help him sleep but he has not noticed any difference since starting it (only tried once or twice).   They wanted to check to see if you had any recommendations on this.   Thanks!

## 2022-04-06 ENCOUNTER — Encounter: Payer: Medicare Other | Admitting: Gastroenterology

## 2022-04-06 NOTE — Telephone Encounter (Signed)
Please call and advise patient/wife that he needs to be consistent with the Trazodone, this is not a medication that you try once or twice, you have to take it every night.   Dr. April Manson

## 2022-04-12 ENCOUNTER — Other Ambulatory Visit: Payer: Self-pay

## 2022-04-12 DIAGNOSIS — E1122 Type 2 diabetes mellitus with diabetic chronic kidney disease: Secondary | ICD-10-CM

## 2022-04-19 ENCOUNTER — Telehealth: Payer: Self-pay

## 2022-04-19 NOTE — Progress Notes (Signed)
Care Management & Coordination Services Pharmacy Team  Reason for Encounter: Appointment Reminder  Contacted patient to confirm in office appointment with Burman Riis, PharmD on 04/21/2022 at 3:00. Spoke with patients wife on 04/19/2022   Have you seen any other providers since your last visit? **Dr Elvera Lennox (derm_ on 03/01/2022 for a skin lesion on left cheek, he was using fluorouracil cream X 4 weeks, 04/18/2022 was his last day of using this.   Any changes in your medications or health?  No new changes not noted in chart.  Any side effects from any medications? No known side effects currently  Do you have an symptoms or problems not managed by your medications? He continues to have some memory issues, she feels Aricept may need to be increased.   Any concerns about your health right now?  Patents wife denies anything new since last physician visit.   Has your provider asked that you check blood pressure, blood sugar, or follow special diet at home? Patients wife states patient is non compliant with diet, she does check his blood sugars and is not checking blood pressures.   Do you get any type of exercise on a regular basis? No, patient is non compliant  Can you think of a goal you would like to reach for your health? No specific goals at this time  Is there anything that you would like to discuss during the appointment? Patients wife has some things she would like to review with you, she has them written down and will bring with her to his appointment.   Pleases wife to bring medications and supplements to appointment  Chart review:  Recent office visits:  03/01/2022 [name] - Patient was seen for insomnia, unspecified type and additional concerns. Started Trazodone 25-50 mg at bedtime prn. Discontinued Dulaglutide, Exenatide, Metformin, oxycodone.   12/24/2021 [name] - Encounter for Medicare annual wellness exam   12/09/2021 [name] - Patient was  seen for essential hypertension and additional concerns. No medication changes.   11/06/2021 [name] - Patient was seen for Stage 3b chronic kidney disease and additional concerns. Started Doxycycline 100 mg twice daily. Increased Diclofenac to 75 mg twice daily. Discontinued Cinnamon and Trazodone.   Recent consult visits:  03/11/2022 [name] (GI) - Patient was seen for Iron deficiency anemia, unspecified iron deficiency anemia type and additional concerns. No medications changes.   03/09/2022 [name] (neurology) - Patient was seen for mild cognitive impairment. Started Donepezil '5mg'$  daily at bedtime. Increased Losartan HCTZ to 100/25 mg daily. Amlodipine 5 mg daily.   03/08/2022 [name] (ophthalmology) - Patient was seen for cataract evaluation and diabetic eye exam. No medication changes.   03/02/2022 [name] (endocrinology) - Patient was seen for Type 2 diabetes mellitus with hyperglycemia and additional concerns. No additional chart notes.   02/02/2022 [name] (endocrinology) - Patient was seen for Type 2 diabetes mellitus with hyperglycemia and additional concerns. No additional chart notes.  01/04/2022 [name] (GI) - Patient was seen for Iron deficiency anemia, unspecified iron deficiency anemia type and additional concerns. Started Na Sulfate-K Sulfate-Mg Sulf 17.5-3.13-1.6 GM/177ML SOLN   12/08/2021 [name] (endocrinology) - Patient was seen for Type 2 diabetes mellitus with hyperglycemia and additional concerns. No additional chart notes.   Hospital visits:  Patient was seen at [facility] ED on 03/04/2022 (4 hours) due to altered mental status .   New?Medications Started at [facility] Discharge:?? None Medication  Changes at Hospital Discharge: None Medications Discontinued at Hospital Discharge: None Medications that remain the same after Hospital Discharge:??  -All other medications will remain  the same.     Patient was seen at [facility] ED on 01/31/2022 (5 hours) due to Abdominal wall hematoma, initial encounter and confusion.   New?Medications Started at [facility] Discharge:?? None Medication Changes at Hospital Discharge: None Medications Discontinued at Hospital Discharge: None Medications that remain the same after Hospital Discharge:??  -All other medications will remain the same.    [facility] on 01/23/2022 ( 6 hours) due to Perineal hematoma, sequela and additional concerns.    New?Medications Started at [facility] Discharge:?? None Medication Changes at Hospital Discharge: None Medications Discontinued at Hospital Discharge: None Medications that remain the same after Hospital Discharge:??  -All other medications will remain the same.    Admitted to [facility] on 01/14/2022 (5 hours) due to right open inguinal hernia repair with mesh.    New?Medications Started at [facility] Discharge:?? oxyCODONE (Oxy IR/ROXICODONE) Medication Changes at Hospital Discharge: Vitamin D3 Medications Discontinued at Hospital Discharge: amoxicillin 500 MG capsule Medications that remain the same after Hospital Discharge:??  -All other medications will remain the same.    Ad[facility] to [facility] on 11/29/2021 due to GI bleed and additional concern. Discharge date was 12/03/2021.    New?Medications Started at [facility] Discharge:?? sucralfate (CARAFATE) Medication Changes at Hospital Discharge: None Medications Discontinued at Hospital Discharge: ASA 81 mg Medications that remain the same after Hospital Discharge:??  -All other medications will remain the same.    Admitted to [facility] on 11/20/2021 due to GI bleed and additional concerns. Discharge date was 11/23/2021.    New?Medications Started at [facility] Discharge:?? ferrous gluconate [facility]) Medication Changes at Hospital Discharge: None Medications Discontinued  at Hospital Discharge: aspirin-acetaminophen-caffeine 250-250-65 MG tablet (EXCEDRIN MIGRAINE) diclofenac 75 MG EC tablet (VOLTAREN) metFORMIN 500 MG tablet (GLUCOPHAGE) Medications that remain the same after Hospital Discharge:??  -All other medications will remain the same.    Care Gaps: AWV - completed 12/24/2021, scheduled 12/28/2022 Last BP - 110/62 on 03/11/2022 Last A1C - 7.2 on 11/23/2021 Last eye exam - 01/05/2021 Last foot exam - 07/29/2021 Urine ACR - overdue Flu - postponed  Star Rating Drugs:  Atorvastatin 40 mg - last filled 03/06/2022 90 [facility] Farxiga 10 mg - last filled 01/20/2022 28 [facility] Losartan HCTZ 100/25 mg - last filled 01/19/202490 DS at [facility] Pharmacist Assistant [phone]

## 2022-04-19 NOTE — Progress Notes (Signed)
Care Management & Coordination Services Pharmacy Note  04/21/2022 Name:  Daryl Webb MRN:  [id number] DOB:  1945/03/05  Summary: A1C not at goal <7.0 LDL at goal <70 but past due for update (last checked 2019) BP at goal <130/80 Pt requesting increase in donepezil, will discuss with Neuro who prescribes  Recommendations/Changes made from today's visit: -Dexcom G7 samples provided in office to assess current sugar control -ORDER updated A1C with PCP approval -ORDER updated lipid panel with PCP approval -ORDER updated B12 and Vit D per pt request at PCP approval -Increase donepezil to '10mg'$ , with neuro approval  Follow up plan: F/u in 10 days to assess use of CGM Dexcom G7 and if continued use is desired General review in 6-8 weeks Pharmacist visit in 4 months   Subjective: Daryl Webb is an 77 y.o. year old male who is a primary patient of [name], [name], MD.  The care coordination team was consulted for assistance with disease management and care coordination needs.    Engaged with patient face to face for initial visit. Wife Daryl Webb is also present for the appt and is managing his medications for him.  Recent office visits: 03/01/2022 [name] - Patient was seen for insomnia, unspecified type and additional concerns. Started Trazodone 25-50 mg at bedtime prn. Discontinued Dulaglutide, Exenatide, Metformin, oxycodone.    12/24/2021 [name] - Encounter for Medicare annual wellness exam    12/09/2021 [name] - Patient was seen for essential hypertension and additional concerns. No medication changes.    11/06/2021 [name] - Patient was seen for Stage 3b chronic kidney disease and additional concerns. Started Doxycycline 100 mg twice daily. Increased Diclofenac to 75 mg twice daily. Discontinued Cinnamon and Trazodone.  Recent consult visits: 03/11/2022 [name] (GI) - Patient was seen for Iron deficiency anemia,  unspecified iron deficiency anemia type and additional concerns. No medications changes.    03/09/2022 [name] (neurology) - Patient was seen for mild cognitive impairment. Started Donepezil '5mg'$  daily at bedtime. Increased Losartan HCTZ to 100/25 mg daily. Amlodipine 5 mg daily.    03/08/2022 [name] (ophthalmology) - Patient was seen for cataract evaluation and diabetic eye exam. No medication changes.    03/02/2022 [name] (endocrinology) - Patient was seen for Type 2 diabetes mellitus with hyperglycemia and additional concerns. No additional chart notes.    02/02/2022 [name] (endocrinology) - Patient was seen for Type 2 diabetes mellitus with hyperglycemia and additional concerns. No additional chart notes.   01/04/2022 [name] (GI) - Patient was seen for Iron deficiency anemia, unspecified iron deficiency anemia type and additional concerns. Started Na Sulfate-K Sulfate-Mg Sulf 17.5-3.13-1.6 GM/177ML SOLN    12/08/2021 [name] (endocrinology) - Patient was seen for Type 2 diabetes mellitus with hyperglycemia and additional concerns. No additional chart notes.   Hospital visits: 03/04/22 [name] 03/04/22 - For AMS, 4 hr stay, no med changes 01/31/22 [name] 01/31/22 - For abdominal wall hematoma/confusion, no med changes 01/23/22 [facility] - For perineal hematoma, sequela, no med changes 01/14/22 [name] - For 5 hours for right open inguinal hernia repair. START Oxycodone 11/29/21 [facility] - LOS 4 days, for GI bleed, START carafate, stop aspirin 11/20/21 [name] - LOS 3 days, for GI Bleed, START ferrous gluconate, stop excedrin, diclofenac, and metformin  Objective:  Lab Results  Component Value Date   CREATININE 1.86 (H) 03/11/2022   BUN  36 (H) 03/11/2022   GFR 34.60 (L) 03/11/2022   EGFR 48 (L) 05/02/2020   GFRNONAA 40 (L) 03/04/2022   GFRAA 50 (L) 11/19/2019   NA 135 03/11/2022   K 4.3 03/11/2022    CALCIUM 9.1 03/11/2022   CO2 26 03/11/2022   GLUCOSE 360 (H) 03/11/2022    Lab Results  Component Value Date/Time   HGBA1C 7.2 (H) 11/23/2021 09:43 AM   HGBA1C 8.1 (H) 11/21/2021 08:38 AM   HGBA1C 7.6 01/29/2021 12:00 AM   HGBA1C 6.9 06/05/2019 12:00 AM   GFR 34.60 (L) 03/11/2022 02:44 PM   GFR 37.53 (L) 01/04/2022 02:47 PM   MICROALBUR 18.9 11/19/2019 10:31 AM   MICROALBUR 84.2 01/06/2015 08:22 AM    Last diabetic Eye exam: No results found for: "HMDIABEYEEXA"  Last diabetic Foot exam: No results found for: "HMDIABFOOTEX"   Lab Results  Component Value Date   CHOL 94 06/28/2017   HDL 27.90 (L) 06/28/2017   LDLCALC 43 06/28/2017   LDLDIRECT 73.8 11/20/2012   TRIG 112.0 06/28/2017   CHOLHDL 3 06/28/2017       Latest Ref Rng & Units 03/04/2022    2:26 AM 01/31/2022    1:52 PM 01/23/2022    9:11 AM  Hepatic Function  Total Protein 6.5 - 8.1 g/dL 7.3  7.5  6.9   Albumin 3.5 - 5.0 g/dL 3.6  3.7  3.9   AST 15 - 41 U/L '20  18  12   '$ ALT 0 - 44 U/L '19  15  12   '$ Alk Phosphatase 38 - 126 U/L 87  86  80   Total Bilirubin 0.3 - 1.2 mg/dL 0.5  0.7  0.7     Lab Results  Component Value Date/Time   TSH 3.28 12/09/2021 10:39 AM       Latest Ref Rng & Units 03/11/2022    2:44 PM 01/31/2022    2:00 PM 01/31/2022    1:52 PM  CBC  WBC 4.0 - 10.5 K/uL 5.6   7.9   Hemoglobin 13.0 - 17.0 g/dL 12.8  12.2  10.8   Hematocrit 39.0 - 52.0 % 40.3  36.0  34.9   Platelets 150.0 - 400.0 K/uL 253.0   251     Lab Results  Component Value Date/Time   VD25OH 39 11/19/2019 10:31 AM   VITAMINB12 246 11/21/2021 08:38 AM    Clinical ASCVD: Yes -CAD The ASCVD Risk score (Arnett DK, et al., 2019) failed to calculate for the following reasons:   Cannot find a previous HDL lab   Cannot find a previous total cholesterol lab       02/09/2022    2:28 PM 12/24/2021   11:09 AM 12/09/2021   10:33 AM  Depression screen PHQ 2/9  Decreased Interest 0 0 0  Down, Depressed, Hopeless 0 0 0  PHQ - 2  Score 0 0 0  Altered sleeping  0 1  Tired, decreased energy  0 1  Change in appetite  0 0  Feeling bad or failure about yourself   0 0  Trouble concentrating  0 1  Moving slowly or fidgety/restless  0 2  Suicidal thoughts  0 0  PHQ-9 Score  0 5  Difficult doing work/chores  Not difficult at all Very difficult     Social History   Tobacco Use  Smoking Status Former   Packs/day: 1.50   Years: 20.00   Total pack years: 30.00   Types: Cigarettes   Quit date:  02/09/1980   Years since quitting: 42.2  Smokeless Tobacco Never   BP Readings from Last 3 Encounters:  03/11/22 110/62  03/09/22 134/68  03/04/22 (!) 144/63   Pulse Readings from Last 3 Encounters:  03/11/22 70  03/09/22 66  03/04/22 79   Wt Readings from Last 3 Encounters:  03/11/22 158 lb 2 oz (71.7 kg)  03/09/22 160 lb (72.6 kg)  03/04/22 156 lb 8.4 oz (71 kg)   BMI Readings from Last 3 Encounters:  03/11/22 28.01 kg/m  03/09/22 25.06 kg/m  03/04/22 24.52 kg/m    Allergies  Allergen Reactions   Aspirin Other (See Comments)    Bleeding ulcer   Diclofenac Other (See Comments)    Bleeding ulcer   Excedrin Tension Headache [Acetaminophen-Caffeine] Other (See Comments)    Bleeding ulcer   Motrin [Ibuprofen] Other (See Comments)    Bleeding ulcer    Medications Reviewed Today     Reviewed by Maren Reamer, RPH (Pharmacist) on 04/21/22 at 3  Med List Status: <None>   Medication Order Taking? Sig Documenting Provider Last Dose Status Informant  atorvastatin (LIPITOR) 40 MG tablet [id number] Yes Take 1 tablet (40 mg total) by mouth daily.  Patient taking differently: Take 40 mg by mouth at bedtime.   [name], MD Taking Active Multiple Informants  calcium carbonate (TUMS - DOSED IN MG ELEMENTAL CALCIUM) 500 MG chewable tablet [id number] Yes Chew 2 tablets by mouth daily as needed for indigestion or heartburn. [provider] Taking Active Multiple Informants  Cholecalciferol (VITAMIN  D3) 1000 units CAPS [id number] Yes Take 2,000 Units by mouth daily. Kinsinger, Arta Bruce, MD Taking Active   Coenzyme Q10 (COQ10) 100 MG CAPS [id number] Yes Take 100 mg by mouth daily. [provider] Taking Active Multiple Informants  cyanocobalamin (VITAMIN B12) 1000 MCG/ML injection [id number]  Inject 1,000 mcg into the muscle every 30 (thirty) days. [provider]  Active Multiple Informants  donepezil (ARICEPT) 5 MG tablet [id number] Yes Take 1 tablet (5 mg total) by mouth at bedtime. Alric Ran, MD Taking Active   FARXIGA 10 MG TABS tablet [id number] Yes Take 10 mg by mouth in the morning. [provider] Taking Active Multiple Informants           Med Note Corky Mull   Thu Jan 07, 2022 11:18 AM)    ferrous gluconate (FERGON) 324 MG tablet [id number]  Take 1 tablet (324 mg total) by mouth daily with breakfast. Duard Brady, MD  Active Multiple Informants  Insulin Glargine (BASAGLAR KWIKPEN) 100 UNIT/ML [id number] Yes 10 units (titrate as directed up to 50 units per day) Subcutaneous once a day for 30 days [provider] Taking Active            Med Note Maren Reamer   Wed Apr 21, 2022  4:36 PM) 46 units daily  losartan-hydrochlorothiazide (HYZAAR) 100-25 MG tablet [id number] Yes Take 1 tablet by mouth daily. [provider] Taking Active   metoprolol succinate (TOPROL-XL) 50 MG 24 hr tablet [id number] Yes Take 1 tablet (50 mg total) by mouth daily. Take with or immediately following a meal. [name], MD Taking Active   NON FORMULARY [id number] Yes Take 2 capsules by mouth See admin instructions. Vicks ZzzQuil Pure Zzzs Triple Action Gummies- Chew 2 gummies by mouth every night at bedtime [provider] Taking Active Multiple Informants  pantoprazole (PROTONIX) 40 MG tablet [id number]  Take 1 tablet (40 mg total) by mouth 2 (two)  times daily before a meal. Debbe Odea, MD  Active Multiple Informants           Med Note  Maren Reamer   Wed Apr 21, 2022  4:37 PM) 1 tab Once daily  traZODone (DESYREL) 50 MG tablet [id number]  Take 0.5-1 tablets (25-50 mg total) by mouth at bedtime as needed for sleep. [name], [name], MD  Active   TYLENOL 500 MG tablet [id number] Yes Take 1,000 mg by mouth every 6 (six) hours as needed for headache. [provider] Taking Active Multiple Informants            SDOH:  (Social Determinants of Health) assessments and interventions performed: Yes SDOH Interventions    [place] Coordination from 04/21/2022 in [place] from 12/24/2021 in [place] [facility] to Hosp-Admission (Discharged) from 11/20/2021 in [facility] Office Visit from 04/22/2021 in [facility] [place] Interventions Intervention Not Indicated Intervention Not Indicated Intervention Not Indicated --  Housing Interventions Intervention Not Indicated Intervention Not Indicated Intervention Not Indicated --  Transportation Interventions Intervention Not Indicated Intervention Not Indicated Intervention Not Indicated, Ambulatory [id number] Order --  Utilities Interventions Intervention Not Indicated Intervention Not Indicated Intervention Not Indicated --  Alcohol Usage Interventions -- Intervention Not Indicated (Score <7) -- --  Depression Interventions/Treatment  -- -- -- Medication  Financial Strain Interventions -- Intervention Not Indicated -- --  Physical Activity Interventions -- Intervention Not Indicated -- --  Stress Interventions -- Intervention Not Indicated -- --  Social Connections Interventions -- Intervention Not Indicated -- --       Medication Assistance: None required.  Patient affirms current coverage meets needs.  Medication Access: Within the past 30 days, how often has patient missed a dose of medication?  None Is a pillbox or other method used to improve adherence? Yes  Factors that may affect medication adherence?  Memory impairment - wife acting as caregiver and managing medications Are meds synced by current pharmacy? No  Are meds delivered by current pharmacy? Yes  Does patient experience delays in picking up medications due to transportation concerns? No   [facility] Reviewed: Is patient disadvantaged to use [facility]?: Yes  Current Rx insurance plan: [place] [id number] [place] [place] [address]: [phone]: [phone] [id number]: [phone]: [phone] [facility] services reviewed with patient today?: No  Patient requests to transfer care to [facility]?: No  Reason patient declined to change pharmacies: Disadvantaged due to insurance/mail order  Compliance/Adherence/Medication fill history: Care Gaps: AWV - completed 12/24/2021, scheduled 12/28/2022 Last BP - 110/62 on 03/11/2022 Last A1C - 7.2 on 11/23/2021 Last eye exam - 01/05/2021 Last foot exam - 07/29/2021 Urine ACR - overdue Flu - postponed  Star-[facility] Drugs: Losartan/HCTZ 100/'25mg'$  PDC 88% Farxiga '10mg'$  LF 01/20/22 for 28DS - has and is taking, brought to appt today Atorvastatin '40mg'$  PDC 95%   Assessment/Plan   Hypertension (BP goal <130/80) -Controlled -Current treatment: Losartan/HCTZ 100/'25mg'$  1 qd Appropriate, Effective, Safe, Accessible Metoprolol XL '50mg'$  1 qd Appropriate, Effective, Safe, Accessible -Medications previously tried: Amlodipine -Current home readings: Did not  discuss -Current dietary habits: did not discuss -Current exercise habits: limited, sedentary since hospitalizations for GI  issues -Denies hypotensive/hypertensive symptoms -Educated on BP goals and benefits of medications for prevention of heart attack, stroke and kidney damage; Daily salt intake goal < 2300 mg; Exercise goal of 150 minutes per week; Importance of home blood pressure monitoring; -Counseled to monitor BP at home once weekly, document, and provide log at future appointments -Counseled on diet and exercise extensively Recommended to continue current medication  CAD (Goal: Slow progression of atherosclerosis (plaques / blockages) throughout your body to reduce risk of heart attack and strokes) [place] controlled/uncontrolled: Controlled Current Medication Therapy: None Medications Previously Tried: Aspirin (GI Bleed) Chest Pain in last 3 months: No Any signs of bleed? No Counseled on: Bleed Risk and the importance of continuing pantoprazole unless cleared by GI   Hyperlipidemia: (LDL goal < 70) -Not ideally controlled -Current treatment: Atorvastatin '40mg'$  1 qd Appropriate, Query Effective CoQ10 1 qd Appropriate, Effective, Safe, Accessible -Medications previously tried: None  -Current dietary patterns: not discussed -Current exercise habits: see above -Educated on Cholesterol goals;  Benefits of statin for ASCVD risk reduction; Exercise goal of 150 minutes per week; -Recommended to continue current medication Recommended updated lipid panel, with PCP approval  Diabetes (A1c goal <7%) -Uncontrolled -Current medications: Farxiga '10mg'$  1 qd Appropriate, Query Effective Basaglar 46 units daily Appropriate, Query Effective -Medications previously tried: Bydureon, Metformin  -Current home glucose readings fasting glucose: 80-140, checking once to twice daily -Denies hypoglycemic/hyperglycemic symptoms -Current meal patterns:  Not discussed today -Current exercise: see above -Educated on A1c and blood sugar goals; Complications of diabetes including kidney damage, retinal damage, and  cardiovascular disease; Exercise goal of 150 minutes per week; Benefits of routine self-monitoring of blood sugar; -Counseled to check feet daily and get yearly eye exams -Counseled on diet and exercise extensively Recommended to continue current medication Recommended CGM, Dexcom G7 sensor 10 day sample  (2 boxes - 20DS) provided in office. If patient likes, will request rx from PCP  Chronic Kidney Disease Stage 3b  -All medications assessed for renal dosing and appropriateness in chronic kidney disease.   Confusion (Goal: Slow down progression of memory loss) -Not ideally controlled -Current treatment  Donepezil '5mg'$  Appropriate, Query Effective Focus Factor 2 tabs daily Appropriate, Effective, Safe, Accessible -Medications previously tried: None  -Counseled on time to see effect. Patient and wife are both interested in increasing to '10mg'$  once daily, will discuss with Neurologist Dr. April Manson on their behalf.  GI Bleed/Peptic Ulcer (Goal: Prevent GI Bleed) -Not ideally controlled -Current treatment  Pantoprazole '40mg'$  once daily Appropriate, Effective, Safe, Accessible Ferrous Gluconate '324mg'$  once daily - not currently taking, awaiting GI lab results to see if needed to continue -Medications previously tried: None  -Counseled on order for labs on 3/18 to check for hemoglobin and f/u with GI  Insomnia (Goal: Achieve quality sleep and limit sleep disturbances) -Not ideally controlled -Current treatment  Trazodone '50mg'$  1/2 to 1 tab prn - not taking Zzzquil (Melatonin '5mg'$  and [place]) 2 tabs qhs Appropriate, Effective, Safe, Accessible -Medications previously tried: None   OTC  -Current treatment  Tums '500mg'$  chewable Appropriate, Effective, Safe, Accessible Vitamin D 1000 units 2 tabs daily Appropriate, Effective, Safe, Accessible Tylenol '500mg'$  2 daily prn Appropriate, Effective, Safe, Accessible -Medications previously tried: None     Maren Reamer Clinical  Pharmacist [phone]

## 2022-04-21 ENCOUNTER — Telehealth: Payer: Self-pay

## 2022-04-21 ENCOUNTER — Other Ambulatory Visit: Payer: Self-pay | Admitting: Neurology

## 2022-04-21 ENCOUNTER — Ambulatory Visit: Payer: Medicare Other

## 2022-04-21 MED ORDER — DONEPEZIL HCL 10 MG PO TABS: 10.0000 mg | ORAL_TABLET | Freq: Every day | ORAL | 3 refills | Status: DC

## 2022-04-21 NOTE — Telephone Encounter (Signed)
Personal reminder received in Epic Portal made aware of the labs that need to be collected near 04/26/2022  [name] verbalized understanding with all questions answered.

## 2022-04-21 NOTE — Telephone Encounter (Signed)
-----   Message from Gillermina Hu, RN sent at 03/17/2022  4:53 PM EST ----- Personal reminder placed on 03/17/2022 Pls contact wife in 5 weeks or so to remind patient to do labs. Labs due on March 18th

## 2022-04-26 ENCOUNTER — Other Ambulatory Visit (INDEPENDENT_AMBULATORY_CARE_PROVIDER_SITE_OTHER): Payer: Medicare Other

## 2022-04-26 DIAGNOSIS — K259 Gastric ulcer, unspecified as acute or chronic, without hemorrhage or perforation: Secondary | ICD-10-CM | POA: Diagnosis not present

## 2022-04-26 DIAGNOSIS — D509 Iron deficiency anemia, unspecified: Secondary | ICD-10-CM

## 2022-04-26 LAB — CBC WITH DIFFERENTIAL/PLATELET
Basophils Absolute: 0 10*3/uL (ref 0.0–0.1)
Basophils Relative: 0.5 % (ref 0.0–3.0)
Eosinophils Absolute: 0.2 10*3/uL (ref 0.0–0.7)
Eosinophils Relative: 3.2 % (ref 0.0–5.0)
HCT: 40.6 % (ref 39.0–52.0)
Hemoglobin: 13.6 g/dL (ref 13.0–17.0)
Lymphocytes Relative: 21.2 % (ref 12.0–46.0)
Lymphs Abs: 1.6 10*3/uL (ref 0.7–4.0)
MCHC: 33.5 g/dL (ref 30.0–36.0)
MCV: 90.3 fl (ref 78.0–100.0)
Monocytes Absolute: 0.6 10*3/uL (ref 0.1–1.0)
Monocytes Relative: 8.8 % (ref 3.0–12.0)
Neutro Abs: 4.9 10*3/uL (ref 1.4–7.7)
Neutrophils Relative %: 66.3 % (ref 43.0–77.0)
Platelets: 161 10*3/uL (ref 150.0–400.0)
RBC: 4.5 Mil/uL (ref 4.22–5.81)
RDW: 24.1 % — ABNORMAL HIGH (ref 11.5–15.5)
WBC: 7.4 10*3/uL (ref 4.0–10.5)

## 2022-04-26 LAB — IBC + FERRITIN
Ferritin: 26.7 ng/mL (ref 22.0–322.0)
Iron: 76 ug/dL (ref 42–165)
Saturation Ratios: 26.4 % (ref 20.0–50.0)
TIBC: 288.4 ug/dL (ref 250.0–450.0)
Transferrin: 206 mg/dL — ABNORMAL LOW (ref 212.0–360.0)

## 2022-04-26 LAB — BASIC METABOLIC PANEL
BUN: 33 mg/dL — ABNORMAL HIGH (ref 6–23)
CO2: 26 mEq/L (ref 19–32)
Calcium: 9.4 mg/dL (ref 8.4–10.5)
Chloride: 103 mEq/L (ref 96–112)
Creatinine, Ser: 1.77 mg/dL — ABNORMAL HIGH (ref 0.40–1.50)
GFR: 36.69 mL/min — ABNORMAL LOW (ref >60.00)
Glucose, Bld: 122 mg/dL — ABNORMAL HIGH (ref 70–99)
Potassium: 4 mEq/L (ref 3.5–5.1)
Sodium: 139 mEq/L (ref 135–145)

## 2022-04-28 ENCOUNTER — Telehealth: Payer: Self-pay

## 2022-04-28 MED ORDER — DEXCOM G7 SENSOR MISC: 3 refills | Status: DC

## 2022-04-28 NOTE — Progress Notes (Signed)
Care Management & Coordination Services Pharmacy Team  Reason for Encounter: Follow up dexcom  Spoke with patient concerning dexcom.   Patient states he loves the dexcom, and would like to receive a full prescription for dexcom.  He states he is already learning what foods are elevating his blood sugars and is excited to be able to see this and work on getting better control of his DM.  [facility] [address]   [place] Pharmacist Assistant [phone]

## 2022-04-28 NOTE — Addendum Note (Signed)
Addended by: Rodrigo Ran on: 04/28/2022 02:58 PM   Modules accepted: Orders

## 2022-04-28 NOTE — Telephone Encounter (Signed)
-----   Message from Maren Reamer, [facility] sent at 04/28/2022  1:50 PM EDT ----- Pt is requesting a new rx for Dexcom G7 sensors be sent to his preferred pharmacy: Provided samples in office last week and patient is using well.  [id number] [address], [place]  Phone: [phone] Fax: [phone]   Thank you! [employer] Pharmacist [phone]

## 2022-04-30 ENCOUNTER — Other Ambulatory Visit: Payer: Self-pay | Admitting: Neurology

## 2022-05-03 ENCOUNTER — Other Ambulatory Visit (HOSPITAL_BASED_OUTPATIENT_CLINIC_OR_DEPARTMENT_OTHER): Payer: Self-pay | Admitting: Internal Medicine

## 2022-05-03 MED ORDER — METOPROLOL SUCCINATE ER 50 MG PO TB24: 50.0000 mg | ORAL_TABLET | Freq: Every day | ORAL | 1 refills | Status: DC

## 2022-05-05 ENCOUNTER — Telehealth: Payer: Self-pay

## 2022-05-05 NOTE — Telephone Encounter (Signed)
Contacted pt & pt and pt's wife is aware and understanding of labs results.

## 2022-05-11 ENCOUNTER — Encounter: Payer: Self-pay | Admitting: Neurology

## 2022-05-13 ENCOUNTER — Telehealth: Payer: Self-pay | Admitting: Family Medicine

## 2022-05-13 NOTE — Telephone Encounter (Signed)
Patient spouse calling to checking on a referral to neuropsych. Says the facility is stating there is a 6 month wait and she wants to make sure he is in line for an appointment

## 2022-05-18 ENCOUNTER — Other Ambulatory Visit: Payer: Self-pay

## 2022-05-18 ENCOUNTER — Encounter: Payer: Self-pay | Admitting: Family Medicine

## 2022-05-18 DIAGNOSIS — R413 Other amnesia: Secondary | ICD-10-CM

## 2022-05-21 ENCOUNTER — Other Ambulatory Visit: Payer: Self-pay | Admitting: Internal Medicine

## 2022-05-26 ENCOUNTER — Ambulatory Visit (INDEPENDENT_AMBULATORY_CARE_PROVIDER_SITE_OTHER): Payer: Medicare Other | Admitting: Gastroenterology

## 2022-05-26 ENCOUNTER — Encounter: Payer: Self-pay | Admitting: Gastroenterology

## 2022-05-26 VITALS — BP 122/68 | HR 70 | Ht 67.0 in | Wt 164.8 lb

## 2022-05-26 DIAGNOSIS — Z862 Personal history of diseases of the blood and blood-forming organs and certain disorders involving the immune mechanism: Secondary | ICD-10-CM

## 2022-05-26 DIAGNOSIS — Z8719 Personal history of other diseases of the digestive system: Secondary | ICD-10-CM | POA: Diagnosis not present

## 2022-05-26 MED ORDER — FERROUS GLUCONATE 324 (38 FE) MG PO TABS: 324.0000 mg | ORAL_TABLET | Freq: Every day | ORAL | 12 refills | Status: DC

## 2022-05-26 MED ORDER — PANTOPRAZOLE SODIUM 40 MG PO TBEC: 40.0000 mg | DELAYED_RELEASE_TABLET | Freq: Every day | ORAL | 12 refills | Status: DC

## 2022-05-26 NOTE — Progress Notes (Signed)
GASTROENTEROLOGY OUTPATIENT CLINIC VISIT   Primary Care Provider [place], [name], MD [address] [place] [address] [phone]  Patient Profile: Daryl Webb is a 77 y.o. male with a pmh significant for status post TAVR, diabetes, spinal stenosis, prior prostate cancer, hypertension, hyperlipidemia, prior inguinal hernia, PUD (history of DUs and NSAIDs).  The patient presents to the [facility] for an evaluation and management of problem(s) noted below:  Problem List 1. History of iron deficiency anemia   2. History of duodenal ulcer     History of Present Illness Please see prior GI notes for full details of HPI.  Interval History The patient returns for follow-up.  He is accompanied by his wife.  Overall he has been doing well.  He has not noted any significant issues.  He is actually stopped his iron and still doing well.  They wonder if he should restart iron.  He had a Cologuard that returned negative.  They would like to try to minimize endoscopy anesthesia if possible.  GI Review of Systems Positive as above Negative for dysphagia, odynophagia, nausea, vomiting, pain, alteration of bowel habits, melena, hematochezia  Review of Systems General: Denies fevers/chills/weight loss unintentionally Cardiovascular: Denies chest pain Pulmonary: Denies shortness of breath Gastroenterological: See HPI Genitourinary: Denies darkened urine  Hematological: Positive for history of easy bruising/bleeding Dermatological: Denies jaundice Psychological: Mood is stable   Medications Current Outpatient Medications  Medication Sig Dispense Refill   atorvastatin (LIPITOR) 40 MG tablet Take 1 tablet (40 mg total) by mouth daily. (Patient taking differently: Take 40 mg by mouth at bedtime.) 90 tablet 3   calcium carbonate (TUMS - DOSED IN MG ELEMENTAL CALCIUM) 500 MG chewable tablet Chew 2 tablets by mouth daily as needed for indigestion or  heartburn.     Cholecalciferol (VITAMIN D3) 1000 units CAPS Take 2,000 Units by mouth daily. 1 capsule 0   Coenzyme Q10 (COQ10) 100 MG CAPS Take 100 mg by mouth daily.     Continuous Blood Gluc Sensor (DEXCOM G7 SENSOR) MISC Use to check sugar continuously. 3 each 3   cyanocobalamin (VITAMIN B12) 1000 MCG/ML injection Inject 1,000 mcg into the muscle every 30 (thirty) days.     donepezil (ARICEPT) 10 MG tablet Take 1 tablet (10 mg total) by mouth at bedtime. 90 tablet 3   FARXIGA 10 MG TABS tablet Take 10 mg by mouth in the morning.     Insulin Glargine (BASAGLAR KWIKPEN) 100 UNIT/ML 10 units (titrate as directed up to 50 units per day) Subcutaneous once a day for 30 days     losartan-hydrochlorothiazide (HYZAAR) 100-25 MG tablet Take 1 tablet by mouth daily.     metoprolol succinate (TOPROL-XL) 50 MG 24 hr tablet TAKE 1 TABLET DAILY, WITH  OR IMMEDIATELY FOLLOWING A MEAL 60 tablet 0   NON FORMULARY Take 2 capsules by mouth See admin instructions. Vicks ZzzQuil Pure Zzzs Triple Action Gummies- Chew 2 gummies by mouth every night at bedtime     traZODone (DESYREL) 50 MG tablet Take 0.5-1 tablets (25-50 mg total) by mouth at bedtime as needed for sleep. 30 tablet 1   TYLENOL 500 MG tablet Take 1,000 mg by mouth every 6 (six) hours as needed for headache.     ferrous gluconate (FERGON) 324 MG tablet Take 1 tablet (324 mg total) by mouth daily with breakfast. 30 tablet 12   pantoprazole (PROTONIX) 40 MG tablet Take 1 tablet (40 mg total) by mouth daily. 30 tablet  12   No current facility-administered medications for this visit.    Allergies Allergies  Allergen Reactions   Aspirin Other (See Comments)    Bleeding ulcer   Diclofenac Other (See Comments)    Bleeding ulcer   Excedrin Tension Headache [Acetaminophen-Caffeine] Other (See Comments)    Bleeding ulcer   Motrin [Ibuprofen] Other (See Comments)    Bleeding ulcer    Histories Past Medical History:  Diagnosis Date   Acute blood  loss anemia 06/27/2012   Anemia    Arthritis    CKD (chronic kidney disease)    Constipation due to pain medication    needs stool softner while on pain medication   Diabetes mellitus    Duodenal ulcer 08/13/2015   2014  - Dr Kaplan/GI   Duodenal ulcer with hemorrhage 08/13/2015   2014  - Dr Kaplan/GI   Dyspnea 06/19/2012   GERD (gastroesophageal reflux disease)    H/O: osteoarthritis    Heart murmur    History of kidney stones    History of prostate cancer    History of urethral stricture    Hypercholesterolemia    Hypertension    Melena 06/19/2012   PONV (postoperative nausea and vomiting)    Prostate cancer    prostate   S/P TAVR (transcatheter aortic valve replacement) 08/12/2020   s/p TAVR with a 29 mm Edwards Sapien 3 THV via the TF approach by Dr. Excell Seltzer and Dr. Laneta Simmers   Severe aortic stenosis    Spinal stenosis    Ulcer duodenal hemorrhage    Past Surgical History:  Procedure Laterality Date   ARTHROPLASTY  2004   right knee Dr. Candie Chroman SURGERY     BIOPSY  11/21/2021   Procedure: BIOPSY;  Surgeon: Sherrilyn Rist, MD;  Location: [facility];  Service: Gastroenterology;;   CARDIAC CATHETERIZATION     CARPAL TUNNEL RELEASE     right hand,wrist Dr. Teressa Senter   CYSTOSCOPY N/A 09/20/2012   Procedure: Bluford Kaufmann;  Surgeon: Crecencio Mc, MD;  Location: [facility];  Service: Urology;  Laterality: N/A;   ESOPHAGOGASTRODUODENOSCOPY N/A 11/21/2021   Procedure: ESOPHAGOGASTRODUODENOSCOPY (EGD);  Surgeon: Sherrilyn Rist, MD;  Location: [facility];  Service: Gastroenterology;  Laterality: N/A;   ESOPHAGOGASTRODUODENOSCOPY (EGD) WITH PROPOFOL N/A 11/30/2021   Procedure: ESOPHAGOGASTRODUODENOSCOPY (EGD) WITH PROPOFOL;  Surgeon: Beverley Fiedler, MD;  Location: [facility];  Service: Gastroenterology;  Laterality: N/A;   HEMOSTASIS CLIP PLACEMENT  11/30/2021   Procedure: HEMOSTASIS CLIP PLACEMENT;  Surgeon: Beverley Fiedler, MD;  Location: [facility];  Service:  Gastroenterology;;   INGUINAL HERNIA REPAIR Right 01/14/2022   Procedure: RIGHT OPEN INGUINAL HERNIA REPAIR WITH MESH;  Surgeon: Kinsinger, De Blanch, MD;  Location: [facility];  Service: General;  Laterality: Right;   JOINT REPLACEMENT Right    KNEE ARTHROSCOPY Right    LUMBAR LAMINECTOMY  02/08/2013   PROSTATECTOMY     radical   RIGHT/LEFT HEART CATH AND CORONARY ANGIOGRAPHY N/A 05/08/2020   Procedure: RIGHT/LEFT HEART CATH AND CORONARY ANGIOGRAPHY;  Surgeon: Tonny Bollman, MD;  Location: [facility] INVASIVE CV LAB;  Service: Cardiovascular;  Laterality: N/A;   TEE WITHOUT CARDIOVERSION N/A 08/12/2020   Procedure: TRANSESOPHAGEAL ECHOCARDIOGRAM (TEE);  Surgeon: Tonny Bollman, MD;  Location: [facility] INVASIVE CV LAB;  Service: Open Heart Surgery;  Laterality: N/A;   TOOTH EXTRACTION  12/2021   TRANSCATHETER AORTIC VALVE REPLACEMENT, TRANSFEMORAL N/A 08/12/2020   Procedure: TRANSCATHETER AORTIC VALVE REPLACEMENT, TRANSFEMORAL;  Surgeon: Tonny Bollman, MD;  Location:  [facility] INVASIVE CV LAB;  Service: Open Heart Surgery;  Laterality: N/A;   VASECTOMY  1985   Social History   Socioeconomic History   Marital status: Married    Spouse name: Not on file   Number of children: 2   Years of education: 16   Highest education level: Bachelor's degree (e.g., BA, AB, BS)  Occupational History   Occupation: Retired  Tobacco Use   Smoking status: Former    Packs/day: 1.50    Years: 20.00    Additional pack years: 0.00    Total pack years: 30.00    Types: Cigarettes    Quit date: 02/09/1980    Years since quitting: 42.3   Smokeless tobacco: Never  Vaping Use   Vaping Use: Never used  Substance and Sexual Activity   Alcohol use: No   Drug use: No   Sexual activity: Not Currently  Other Topics Concern   Not on file  Social History Narrative   Retired from Engineer, structural (boats).    Lives with wife,    two children in [place].    Enjoys reading, boating, travelling to [place] in the winter with wife.    Social Determinants of Health   Financial Resource Strain: Low Risk  (12/24/2021)   Overall Financial Resource Strain (CARDIA)    Difficulty of Paying Living Expenses: Not hard at all  Food Insecurity: No Food Insecurity (04/21/2022)   Hunger Vital Sign    Worried About Running Out of Food in the Last Year: Never true    Ran Out of Food in the Last Year: Never true  Transportation Needs: No Transportation Needs (04/21/2022)   PRAPARE - Administrator, Civil Service (Medical): No    Lack of Transportation (Non-Medical): No  Physical Activity: Inactive (12/24/2021)   Exercise Vital Sign    Days of Exercise per Week: 0 days    Minutes of Exercise per Session: 0 min  Stress: No Stress Concern Present (12/24/2021)   [employer] of Occupational Health - Occupational Stress Questionnaire    Feeling of Stress : Not at all  Social Connections: Moderately Isolated (12/24/2021)   Social Connection and Isolation Panel [NHANES]    Frequency of Communication with Friends and Family: More than three times a week    Frequency of Social Gatherings with Friends and Family: More than three times a week    Attends Religious Services: Never    Database administrator or Organizations: No    Attends Banker Meetings: Never    Marital Status: Married  Catering manager Violence: Not At Risk (12/24/2021)   Humiliation, Afraid, Rape, and Kick questionnaire    Fear of Current or Ex-Partner: No    Emotionally Abused: No    Physically Abused: No    Sexually Abused: No   Family History  Problem Relation Age of Onset   Heart disease Mother    Heart failure Mother    Heart disease Father    Diabetes Father    Colon cancer Neg Hx    Esophageal cancer Neg Hx    Inflammatory bowel disease Neg Hx    Liver disease Neg Hx    Pancreatic cancer Neg Hx    Rectal cancer Neg Hx    Stomach cancer Neg Hx    I have reviewed his medical, social, and family history in detail and  updated the electronic medical record as necessary.    PHYSICAL EXAMINATION  BP 122/68   Pulse 70  Ht  (1.702 m)   Wt 164 lb 12.8 oz (74.8 kg)   BMI 25.81 kg/m  Wt Readings from Last 3 Encounters:  05/26/22 164 lb 12.8 oz (74.8 kg)  03/11/22 158 lb 2 oz (71.7 kg)  03/09/22 160 lb (72.6 kg)  GEN: NAD, appears stated age, doesn't appear chronically ill, accompanied by wife PSYCH: Cooperative, without pressured speech EYE: Conjunctivae pink, sclerae anicteric ENT: MMM CV: Nontachycardic RESP: No audible wheezing GI: NABS, soft, NT/ND, without rebound MSK/EXT: No significant lower extremity edema SKIN: No jaundice NEURO:  Alert & Oriented x 3, no focal deficits   REVIEW OF DATA  I reviewed the following data at the time of this encounter:  GI Procedures and Studies  Negative Cologuard 2024  Laboratory Studies  Reviewed those in epic  Imaging Studies  No new imaging studies to review   ASSESSMENT  Daryl Webb is a 77 y.o. male with a pmh significant for status post TAVR, diabetes, spinal stenosis, prior prostate cancer, hypertension, hyperlipidemia, prior inguinal hernia, PUD (history of DUs and NSAIDs).  The patient is seen today for evaluation and management of:  1. History of iron deficiency anemia   2. History of duodenal ulcer    The patient is hemodynamically and clinically stable.  He continues to do well.  I think we can decrease his PPI to once daily dosing.  Even though there is some concern for beginnings of dementia, he has had significant GI bleeding in the past and I think it would be best that he remain on some form of PPI therapy.  Hopefully we can eventually get him down to 20 mg daily, but for now 40 mg daily.  I find that there is little downside to for the patient to be on iron supplementation and have asked him to go ahead and restart that on a daily to every other day basis.  If the patient has evidence of recurrent iron deficiency anemia, then  he will require a repeat upper endoscopy as well as a colonoscopy (even with a negative Cologuard) and potentially a video capsule endoscopy.  I think this will be unlikely but something for [place] to keep in mind.  Will follow-up the patient on an as-needed basis or something else develops.  Will get repeat iron studies and blood counts at follow-up with his primary care provider in the next few weeks.  All patient questions were answered to the best of my ability, and the patient agrees to the aforementioned plan of action with follow-up as indicated.   PLAN  Laboratories as outlined below to be performed at PCP visit If no evidence of recurrent iron deficiency, then can just continue moving forward If evidence of iron deficiency, will need to consider EGD/colonoscopy +/- VCE Decrease PPI to 40 mg once daily - If patient is doing well in the coming months PCP could decrease to 20 mg daily or we can decrease to 20 mg daily Restart iron once daily versus every other day if tolerated   Orders Placed This Encounter  Procedures   CBC   IBC + Ferritin    New Prescriptions   No medications on file   Modified Medications   Modified Medication Previous Medication   FERROUS GLUCONATE (FERGON) 324 MG TABLET ferrous gluconate (FERGON) 324 MG tablet      Take 1 tablet (324 mg total) by mouth daily with breakfast.    Take 1 tablet (324 mg total) by mouth daily with breakfast.  PANTOPRAZOLE (PROTONIX) 40 MG TABLET pantoprazole (PROTONIX) 40 MG tablet      Take 1 tablet (40 mg total) by mouth daily.    Take 1 tablet (40 mg total) by mouth 2 (two) times daily before a meal.    Planned Follow Up No follow-ups on file.   Total Time in Face-to-Face and in Coordination of Care for patient including independent/personal interpretation/review of prior testing, medical history, examination, medication adjustment, communicating results with the patient directly, and documentation within the EHR is 25  minutes.   Corliss Parish, MD [facility] Office # [phone]

## 2022-05-26 NOTE — Patient Instructions (Addendum)
We have sent the following medications to your pharmacy for you to pick up at your convenience: Pantoprazole , Ferrous Gluconate   Decrease your Pantoprazole 40 mg once daily.   Follow up as needed.   _______________________________________________________  If your blood pressure at your visit was 140/90 or greater, please contact your primary care physician to follow up on this.  _______________________________________________________  If you are age 76 or older, your body mass index should be between 23-30. Your Body mass index is 25.81 kg/m. If this is out of the aforementioned range listed, please consider follow up with your Primary Care Provider.  If you are age 34 or younger, your body mass index should be between 19-25. Your Body mass index is 25.81 kg/m. If this is out of the aformentioned range listed, please consider follow up with your Primary Care Provider.   ________________________________________________________  The [facility] providers would like to encourage you to use [facility] to communicate with providers for non-urgent requests or questions.  Due to long hold times on the telephone, sending your provider a message by [facility] may be a faster and more efficient way to get a response.  Please allow 48 business hours for a response.  Please remember that this is for non-urgent requests.  _______________________________________________________  Thank you for choosing me and [facility].  Dr. Meridee Score

## 2022-05-27 ENCOUNTER — Encounter: Payer: Self-pay | Admitting: Psychology

## 2022-05-29 ENCOUNTER — Encounter: Payer: Self-pay | Admitting: Gastroenterology

## 2022-05-29 DIAGNOSIS — Z862 Personal history of diseases of the blood and blood-forming organs and certain disorders involving the immune mechanism: Secondary | ICD-10-CM | POA: Insufficient documentation

## 2022-05-29 DIAGNOSIS — Z8719 Personal history of other diseases of the digestive system: Secondary | ICD-10-CM | POA: Insufficient documentation

## 2022-05-31 ENCOUNTER — Telehealth: Payer: Self-pay

## 2022-05-31 ENCOUNTER — Telehealth: Payer: Self-pay | Admitting: Family Medicine

## 2022-05-31 ENCOUNTER — Encounter: Payer: Self-pay | Admitting: Family Medicine

## 2022-05-31 ENCOUNTER — Ambulatory Visit (INDEPENDENT_AMBULATORY_CARE_PROVIDER_SITE_OTHER): Payer: Medicare Other | Admitting: Family Medicine

## 2022-05-31 VITALS — BP 124/70 | HR 70 | Temp 97.9°F | Resp 16 | Ht 67.0 in | Wt 167.2 lb

## 2022-05-31 DIAGNOSIS — N390 Urinary tract infection, site not specified: Secondary | ICD-10-CM

## 2022-05-31 DIAGNOSIS — R300 Dysuria: Secondary | ICD-10-CM | POA: Diagnosis not present

## 2022-05-31 LAB — POC URINALSYSI DIPSTICK (AUTOMATED)
Bilirubin, UA: NEGATIVE
Blood, UA: POSITIVE
Glucose, UA: POSITIVE — AB
Ketones, UA: NEGATIVE
Nitrite, UA: POSITIVE
Protein, UA: POSITIVE — AB
Spec Grav, UA: 1.015 (ref 1.010–1.025)
Urobilinogen, UA: 1 E.U./dL
pH, UA: 5 (ref 5.0–8.0)

## 2022-05-31 MED ORDER — SULFAMETHOXAZOLE-TRIMETHOPRIM 800-160 MG PO TABS
1.0000 | ORAL_TABLET | Freq: Two times a day (BID) | ORAL | 0 refills | Status: AC
Start: 2022-05-31 — End: 2022-06-07

## 2022-05-31 NOTE — Progress Notes (Unsigned)
ACUTE VISIT Chief Complaint  Patient presents with   Dysuria    Has been using AZO & drinking cranberry juice.   HPI: [name] is a 77 y.o. male with past medical history significant for DM 2, hypertension, hyperlipidemia, CKD 3, PUD,and generalized OA here today complaining of *** HPI burning sensation during urination. He denies experiencing increased urinary frequency, hematuria, urgency, fever, chills, lower abdominal pain, or rectal pain. The burning sensation started last week. He denies any redness or itching associated with yeast infection. He has a history of catheterization, which he performs approximately once or twice a week during showers. He denies any injury related to catheter use.  His appetite remains unchanged. He has recently seen a neurologist who increased his Aricept dosage from 10 to 20 mg and diagnosed him with mild cognitive impairment. The patient tested positive for Alzheimer's disease. He reports no change in bowel habits.  Follows with urologist regularly. Hx of urethral strictures, he is supposed to catheterize regularly but forgetting to do so.  Review of Systems See other pertinent positives and negatives in HPI.  Current Outpatient Medications on File Prior to Visit  Medication Sig Dispense Refill   atorvastatin (LIPITOR) 40 MG tablet Take 1 tablet (40 mg total) by mouth daily. (Patient taking differently: Take 40 mg by mouth at bedtime.) 90 tablet 3   calcium carbonate (TUMS - DOSED IN MG ELEMENTAL CALCIUM) 500 MG chewable tablet Chew 2 tablets by mouth daily as needed for indigestion or heartburn.     Cholecalciferol (VITAMIN D3) 1000 units CAPS Take 2,000 Units by mouth daily. 1 capsule 0   Coenzyme Q10 (COQ10) 100 MG CAPS Take 100 mg by mouth daily.     Continuous Blood Gluc Sensor (DEXCOM G7 SENSOR) MISC Use to check sugar continuously. 3 each 3   cyanocobalamin (VITAMIN B12) 1000 MCG/ML injection Inject 1,000 mcg into the muscle  every 30 (thirty) days.     donepezil (ARICEPT) 10 MG tablet Take 1 tablet (10 mg total) by mouth at bedtime. 90 tablet 3   FARXIGA 10 MG TABS tablet Take 10 mg by mouth in the morning.     ferrous gluconate (FERGON) 324 MG tablet Take 324 mg by mouth every other day.     Insulin Glargine (BASAGLAR KWIKPEN) 100 UNIT/ML 10 units (titrate as directed up to 50 units per day) Subcutaneous once a day for 30 days     losartan-hydrochlorothiazide (HYZAAR) 100-25 MG tablet Take 1 tablet by mouth daily.     metoprolol succinate (TOPROL-XL) 50 MG 24 hr tablet TAKE 1 TABLET DAILY, WITH  OR IMMEDIATELY FOLLOWING A MEAL 60 tablet 0   NON FORMULARY Take 2 capsules by mouth See admin instructions. Vicks ZzzQuil Pure Zzzs Triple Action Gummies- Chew 2 gummies by mouth every night at bedtime     pantoprazole (PROTONIX) 20 MG tablet Take 20 mg by mouth daily.     traZODone (DESYREL) 50 MG tablet Take 0.5-1 tablets (25-50 mg total) by mouth at bedtime as needed for sleep. 30 tablet 1   TYLENOL 500 MG tablet Take 1,000 mg by mouth every 6 (six) hours as needed for headache.     No current facility-administered medications on file prior to visit.   Past Medical History:  Diagnosis Date   Acute blood loss anemia 06/27/2012   Anemia    Arthritis    CKD (chronic kidney disease)    Constipation due to pain medication    needs stool softner  while on pain medication   Diabetes mellitus    Duodenal ulcer 08/13/2015   2014  - Dr Kaplan/GI   Duodenal ulcer with hemorrhage 08/13/2015   2014  - Dr Kaplan/GI   Dyspnea 06/19/2012   GERD (gastroesophageal reflux disease)    H/O: osteoarthritis    Heart murmur    History of kidney stones    History of prostate cancer    History of urethral stricture    Hypercholesterolemia    Hypertension    Melena 06/19/2012   PONV (postoperative nausea and vomiting)    Prostate cancer    prostate   S/P TAVR (transcatheter aortic valve replacement) 08/12/2020   s/p TAVR with a  29 mm Edwards Sapien 3 THV via the TF approach by Dr. Excell Seltzer and Dr. Laneta Simmers   Severe aortic stenosis    Spinal stenosis    Ulcer duodenal hemorrhage    Allergies  Allergen Reactions   Aspirin Other (See Comments)    Bleeding ulcer   Diclofenac Other (See Comments)    Bleeding ulcer   Excedrin Tension Headache [Acetaminophen-Caffeine] Other (See Comments)    Bleeding ulcer   Motrin [Ibuprofen] Other (See Comments)    Bleeding ulcer   Social History   Socioeconomic History   Marital status: Married    Spouse name: Not on file   Number of children: 2   Years of education: 16   Highest education level: Bachelor's degree (e.g., BA, AB, BS)  Occupational History   Occupation: Retired  Tobacco Use   Smoking status: Former    Packs/day: 1.50    Years: 20.00    Additional pack years: 0.00    Total pack years: 30.00    Types: Cigarettes    Quit date: 02/09/1980    Years since quitting: 42.3   Smokeless tobacco: Never  Vaping Use   Vaping Use: Never used  Substance and Sexual Activity   Alcohol use: No   Drug use: No   Sexual activity: Not Currently  Other Topics Concern   Not on file  Social History Narrative   Retired from Engineer, structural (boats).    Lives with wife,    two children in [place].    Enjoys reading, boating, travelling to [place] in the winter with wife.   Social Determinants of Health   Financial Resource Strain: Low Risk  (12/24/2021)   Overall Financial Resource Strain (CARDIA)    Difficulty of Paying Living Expenses: Not hard at all  Food Insecurity: No Food Insecurity (04/21/2022)   Hunger Vital Sign    Worried About Running Out of Food in the Last Year: Never true    Ran Out of Food in the Last Year: Never true  Transportation Needs: No Transportation Needs (04/21/2022)   PRAPARE - Administrator, Civil Service (Medical): No    Lack of Transportation (Non-Medical): No  Physical Activity: Inactive (12/24/2021)   Exercise Vital Sign     Days of Exercise per Week: 0 days    Minutes of Exercise per Session: 0 min  Stress: No Stress Concern Present (12/24/2021)   [employer] of Occupational Health - Occupational Stress Questionnaire    Feeling of Stress : Not at all  Social Connections: Moderately Isolated (12/24/2021)   Social Connection and Isolation Panel [NHANES]    Frequency of Communication with Friends and Family: More than three times a week    Frequency of Social Gatherings with Friends and Family: More than three times a week  Attends Religious Services: Never    Active Member of Clubs or Organizations: No    Attends Banker Meetings: Never    Marital Status: Married   Vitals:   05/31/22 1348  BP: 124/70  Pulse: 70  Resp: 16  Temp: 97.9 F (36.6 C)  SpO2: 100%   Body mass index is 26.2 kg/m.  Physical Exam Vitals and nursing note reviewed.  Constitutional:      General: He is not in acute distress.    Appearance: He is well-developed.  HENT:     Head: Normocephalic and atraumatic.  Eyes:     Conjunctiva/sclera: Conjunctivae normal.  Cardiovascular:     Rate and Rhythm: Normal rate and regular rhythm.     Pulses:          Dorsalis pedis pulses are 2+ on the right side and 2+ on the left side.     Heart sounds: No murmur heard. Pulmonary:     Effort: Pulmonary effort is normal. No respiratory distress.     Breath sounds: Normal breath sounds.  Abdominal:     Palpations: Abdomen is soft. There is no mass.     Tenderness: There is no abdominal tenderness. There is no right CVA tenderness or left CVA tenderness.  Genitourinary:    Comments: *** Skin:    General: Skin is warm.     Findings: No erythema.  Neurological:     Mental Status: He is alert. Mental status is at baseline.  Psychiatric:        Mood and Affect: Mood and affect normal.   ASSESSMENT AND PLAN: Dysuria -     POCT Urinalysis Dipstick (Automated) -     Urine Culture; Future  Urinary tract infection  without hematuria, site unspecified -     Sulfamethoxazole-Trimethoprim; Take 1 tablet by mouth 2 (two) times daily for 7 days.  Dispense: 14 tablet; Refill: 0    Return if symptoms worsen or fail to improve, for keep next appointment.  Tangy Drozdowski G. [place], MD  [facility]. [place] office.

## 2022-05-31 NOTE — Telephone Encounter (Signed)
error 

## 2022-05-31 NOTE — Progress Notes (Signed)
Care Management & Coordination Services Pharmacy Team  Reason for Encounter: General adherence update   Contacted patient for general health update and medication adherence call.  F/u in 10 days to assess use of CGM Dexcom G7 and if continued use is desired  Spoke with  patients wife Misty Stanley  on 05/31/2022    What concerns do you have about your medications? Patients wife denies  The patients wife denies side effects with their medications.   Are you having any problems getting your medications from your pharmacy? Patients wife denies  Since last visit with PharmD, the following interventions have been made. Patient has started Pantoprazole 40 mg daily.   The patient has not had an ED visit since last contact.   Follow up Dexcom G7.  Patients wife states they love the dexcom 7 and will continue to use this, they have no issues at this time with the cost.  Patient has an appointment with his endocrinologist and his wife plans to take the dexcom with them to this appointment and will request a refill for the sensors from his endocrinologist.   Patient denies concerns or questions for Delano Metz, PharmD at this time.   Care Gaps: AWV - completed 12/24/2021, scheduled 12/28/2022 Last BP - 122/68 on 05/26/2022 Last A1C - 7.2 on 11/23/2021 Last eye exam - 01/05/2021 Last foot exam - 07/29/2021 Urine ACR - overdue Covid - postponed   Star Rating Drugs:  Atorvastatin 40 mg - last filled 03/06/2022 90 [facility] Farxiga 10 mg - last filled 03/12/2022 28 [facility] Losartan HCTZ 100/25 mg - last filled [facility]/29/2022 [facility]  Chart Updates:  Recent office visits:  None  Recent consult visits:  05/26/2022 Corliss Parish MD (GI) - Patient was seen for history of iron deficiency anemia and an additional concern. Started Pantoprazole 40 mg daily.   Hospital visits:  None  Medications: Outpatient Encounter Medications as of 05/31/2022  Medication Sig Note   atorvastatin  (LIPITOR) 40 MG tablet Take 1 tablet (40 mg total) by mouth daily. (Patient taking differently: Take 40 mg by mouth at bedtime.)    calcium carbonate (TUMS - DOSED IN MG ELEMENTAL CALCIUM) 500 MG chewable tablet Chew 2 tablets by mouth daily as needed for indigestion or heartburn.    Cholecalciferol (VITAMIN D3) 1000 units CAPS Take 2,000 Units by mouth daily.    Coenzyme Q10 (COQ10) 100 MG CAPS Take 100 mg by mouth daily.    Continuous Blood Gluc Sensor (DEXCOM G7 SENSOR) MISC Use to check sugar continuously.    cyanocobalamin (VITAMIN B12) 1000 MCG/ML injection Inject 1,000 mcg into the muscle every 30 (thirty) days.    donepezil (ARICEPT) 10 MG tablet Take 1 tablet (10 mg total) by mouth at bedtime.    FARXIGA 10 MG TABS tablet Take 10 mg by mouth in the morning.    ferrous gluconate (FERGON) 324 MG tablet Take 1 tablet (324 mg total) by mouth daily with breakfast.    Insulin Glargine (BASAGLAR KWIKPEN) 100 UNIT/ML 10 units (titrate as directed up to 50 units per day) Subcutaneous once a day for 30 days 04/21/2022: 46 units daily   losartan-hydrochlorothiazide (HYZAAR) 100-25 MG tablet Take 1 tablet by mouth daily.    metoprolol succinate (TOPROL-XL) 50 MG 24 hr tablet TAKE 1 TABLET DAILY, WITH  OR IMMEDIATELY FOLLOWING A MEAL    NON FORMULARY Take 2 capsules by mouth See admin instructions. Vicks ZzzQuil Pure Zzzs Triple Action Gummies- Chew 2  gummies by mouth every night at bedtime    pantoprazole (PROTONIX) 40 MG tablet Take 1 tablet (40 mg total) by mouth daily.    traZODone (DESYREL) 50 MG tablet Take 0.5-1 tablets (25-50 mg total) by mouth at bedtime as needed for sleep. 04/21/2022: Hasn't started as of today   TYLENOL 500 MG tablet Take 1,000 mg by mouth every 6 (six) hours as needed for headache.    No facility-administered encounter medications on file as of 05/31/2022.    Recent vitals BP Readings from Last 3 Encounters:  05/26/22 122/68  03/11/22 110/62  03/09/22 134/68   Pulse  Readings from Last 3 Encounters:  05/26/22 70  03/11/22 70  03/09/22 66   Wt Readings from Last 3 Encounters:  05/26/22 164 lb 12.8 oz (74.8 kg)  03/11/22 158 lb 2 oz (71.7 kg)  03/09/22 160 lb (72.6 kg)   BMI Readings from Last 3 Encounters:  05/26/22 25.81 kg/m  03/11/22 28.01 kg/m  03/09/22 25.06 kg/m    Recent lab results    Component Value Date/Time   NA 139 04/26/2022 1122   NA 140 05/02/2020 0000   K 4.0 04/26/2022 1122   CL 103 04/26/2022 1122   CO2 26 04/26/2022 1122   GLUCOSE 122 (H) 04/26/2022 1122   BUN 33 (H) 04/26/2022 1122   BUN 25 05/02/2020 0000   CREATININE 1.77 (H) 04/26/2022 1122   CREATININE 1.57 (H) 11/19/2019 1031   CALCIUM 9.4 04/26/2022 1122    Lab Results  Component Value Date   CREATININE 1.77 (H) 04/26/2022   GFR 36.69 (L) 04/26/2022   EGFR 48 (L) 05/02/2020   GFRNONAA 40 (L) 03/04/2022   GFRAA 50 (L) 11/19/2019   Lab Results  Component Value Date/Time   HGBA1C 7.2 (H) 11/23/2021 09:43 AM   HGBA1C 8.1 (H) 11/21/2021 08:38 AM   HGBA1C 7.6 01/29/2021 12:00 AM   HGBA1C 6.9 06/05/2019 12:00 AM   MICROALBUR 18.9 11/19/2019 10:31 AM   MICROALBUR 84.2 01/06/2015 08:22 AM    Lab Results  Component Value Date   CHOL 94 06/28/2017   HDL 27.90 (L) 06/28/2017   LDLCALC 43 06/28/2017   LDLDIRECT 73.8 11/20/2012   TRIG 112.0 06/28/2017   CHOLHDL 3 06/28/2017    Inetta Fermo CMA  Clinical Pharmacist Assistant [phone]

## 2022-05-31 NOTE — Patient Instructions (Addendum)
A few things to remember from today's visit:  Dysuria - Plan: POCT Urinalysis Dipstick (Automated), Culture, Urine  Urinary tract infection without hematuria, site unspecified - Plan: sulfamethoxazole-trimethoprim (BACTRIM DS) 800-160 MG tablet   Adequate fluid intake, avoid holding urine for long hours, and over the counter Vit C OR cranberry capsules might help.  Today we will treat empirically with antibiotic, which we might need to change when urine culture comes back depending of bacteria susceptibility.  Seek immediate medical attention if severe abdominal pain, vomiting, fever/chills, or worsening symptoms. F/U if symptomatic are not any better after 2-3 days of antibiotic treatment.  If you need refills for medications you take chronically, please call your pharmacy. Do not use My Chart to request refills or for acute issues that need immediate attention. If you send a my chart message, it may take a few days to be addressed, specially if I am not in the office.  Please be sure medication list is accurate. If a new problem present, please set up appointment sooner than planned today.

## 2022-06-01 DIAGNOSIS — E1122 Type 2 diabetes mellitus with diabetic chronic kidney disease: Secondary | ICD-10-CM | POA: Diagnosis not present

## 2022-06-01 DIAGNOSIS — Z794 Long term (current) use of insulin: Secondary | ICD-10-CM | POA: Diagnosis not present

## 2022-06-01 DIAGNOSIS — Z8639 Personal history of other endocrine, nutritional and metabolic disease: Secondary | ICD-10-CM | POA: Diagnosis not present

## 2022-06-01 DIAGNOSIS — E1165 Type 2 diabetes mellitus with hyperglycemia: Secondary | ICD-10-CM | POA: Diagnosis not present

## 2022-06-01 DIAGNOSIS — N1832 Chronic kidney disease, stage 3b: Secondary | ICD-10-CM | POA: Diagnosis not present

## 2022-06-01 LAB — LIPID PANEL
Cholesterol: 85 (ref 0–200)
HDL: 26 — AB (ref 35–70)
LDL Cholesterol: 33
LDl/HDL Ratio: 3.3
Triglycerides: 152 (ref 40–160)

## 2022-06-01 LAB — HEMOGLOBIN A1C: Hemoglobin A1C: 8.4

## 2022-06-01 LAB — VITAMIN B12: Vitamin B-12: 349

## 2022-06-02 LAB — URINE CULTURE
MICRO NUMBER:: 14857807
SPECIMEN QUALITY:: ADEQUATE

## 2022-06-09 ENCOUNTER — Other Ambulatory Visit (HOSPITAL_COMMUNITY): Payer: Self-pay

## 2022-06-09 NOTE — Progress Notes (Unsigned)
HPI: Daryl Webb is a 77 y.o. male, who is here today to follow on recent visit.  Hypertension:  Medications:*** BP readings at home:*** Side effects:***  Negative for unusual or severe headache, visual changes, exertional chest pain, dyspnea,  focal weakness, or edema.  Lab Results  Component Value Date   CREATININE 1.77 (H) 04/26/2022   BUN 33 (H) 04/26/2022   NA 139 04/26/2022   K 4.0 04/26/2022   CL 103 04/26/2022   CO2 26 04/26/2022   Review of Systems See other pertinent positives and negatives in HPI.  Current Outpatient Medications on File Prior to Visit  Medication Sig Dispense Refill   atorvastatin (LIPITOR) 40 MG tablet Take 1 tablet (40 mg total) by mouth daily. (Patient taking differently: Take 40 mg by mouth at bedtime.) 90 tablet 3   calcium carbonate (TUMS - DOSED IN MG ELEMENTAL CALCIUM) 500 MG chewable tablet Chew 2 tablets by mouth daily as needed for indigestion or heartburn.     Cholecalciferol (VITAMIN D3) 1000 units CAPS Take 2,000 Units by mouth daily. 1 capsule 0   Coenzyme Q10 (COQ10) 100 MG CAPS Take 100 mg by mouth daily.     Continuous Blood Gluc Sensor (DEXCOM G7 SENSOR) MISC Use to check sugar continuously. 3 each 3   donepezil (ARICEPT) 10 MG tablet Take 1 tablet (10 mg total) by mouth at bedtime. 90 tablet 3   FARXIGA 10 MG TABS tablet Take 10 mg by mouth in the morning.     ferrous gluconate (FERGON) 324 MG tablet Take 324 mg by mouth every other day.     Insulin Glargine (BASAGLAR KWIKPEN) 100 UNIT/ML 10 units (titrate as directed up to 50 units per day) Subcutaneous once a day for 30 days     losartan-hydrochlorothiazide (HYZAAR) 100-25 MG tablet Take 1 tablet by mouth daily.     metoprolol succinate (TOPROL-XL) 50 MG 24 hr tablet TAKE 1 TABLET DAILY, WITH  OR IMMEDIATELY FOLLOWING A MEAL 60 tablet 0   NON FORMULARY Take 2 capsules by mouth See admin instructions. Vicks ZzzQuil Pure Zzzs Triple Action Gummies- Chew 2 gummies by  mouth every night at bedtime     pantoprazole (PROTONIX) 20 MG tablet Take 20 mg by mouth daily.     TYLENOL 500 MG tablet Take 1,000 mg by mouth every 6 (six) hours as needed for headache.     No current facility-administered medications on file prior to visit.   Past Medical History:  Diagnosis Date   Acute blood loss anemia 06/27/2012   Anemia    Arthritis    CKD (chronic kidney disease)    Constipation due to pain medication    needs stool softner while on pain medication   Diabetes mellitus    Duodenal ulcer 08/13/2015   2014  - Dr Kaplan/GI   Duodenal ulcer with hemorrhage 08/13/2015   2014  - Dr Kaplan/GI   Dyspnea 06/19/2012   GERD (gastroesophageal reflux disease)    H/O: osteoarthritis    Heart murmur    History of kidney stones    History of prostate cancer    History of urethral stricture    Hypercholesterolemia    Hypertension    Melena 06/19/2012   PONV (postoperative nausea and vomiting)    Prostate cancer (HCC)    prostate   S/P TAVR (transcatheter aortic valve replacement) 08/12/2020   s/p TAVR with a 29 mm Edwards Sapien 3 THV via the TF approach by Dr. Excell Seltzer and  Dr. Laneta Simmers   Severe aortic stenosis    Spinal stenosis    Ulcer duodenal hemorrhage    Allergies  Allergen Reactions   Aspirin Other (See Comments)    Bleeding ulcer   Diclofenac Other (See Comments)    Bleeding ulcer   Excedrin Tension Headache [Acetaminophen-Caffeine] Other (See Comments)    Bleeding ulcer   Motrin [Ibuprofen] Other (See Comments)    Bleeding ulcer    Social History   Socioeconomic History   Marital status: Married    Spouse name: Not on file   Number of children: 2   Years of education: 16   Highest education level: Bachelor's degree (e.g., BA, AB, BS)  Occupational History   Occupation: Retired  Tobacco Use   Smoking status: Former    Packs/day: 1.50    Years: 20.00    Additional pack years: 0.00    Total pack years: 30.00    Types: Cigarettes    Quit  date: 02/09/1980    Years since quitting: 42.3   Smokeless tobacco: Never  Vaping Use   Vaping Use: Never used  Substance and Sexual Activity   Alcohol use: No   Drug use: No   Sexual activity: Not Currently  Other Topics Concern   Not on file  Social History Narrative   Retired from Engineer, structural (boats).    Lives with wife,    two children in [place].    Enjoys reading, boating, travelling to [place] in the winter with wife.   Social Determinants of Health   Financial Resource Strain: Low Risk  (12/24/2021)   Overall Financial Resource Strain (CARDIA)    Difficulty of Paying Living Expenses: Not hard at all  Food Insecurity: No Food Insecurity (04/21/2022)   Hunger Vital Sign    Worried About Running Out of Food in the Last Year: Never true    Ran Out of Food in the Last Year: Never true  Transportation Needs: No Transportation Needs (04/21/2022)   PRAPARE - Administrator, Civil Service (Medical): No    Lack of Transportation (Non-Medical): No  Physical Activity: Inactive (12/24/2021)   Exercise Vital Sign    Days of Exercise per Week: 0 days    Minutes of Exercise per Session: 0 min  Stress: No Stress Concern Present (12/24/2021)   [employer] of Occupational Health - Occupational Stress Questionnaire    Feeling of Stress : Not at all  Social Connections: Moderately Isolated (12/24/2021)   Social Connection and Isolation Panel [NHANES]    Frequency of Communication with Friends and Family: More than three times a week    Frequency of Social Gatherings with Friends and Family: More than three times a week    Attends Religious Services: Never    Database administrator or Organizations: No    Attends Banker Meetings: Never    Marital Status: Married    Vitals:   06/11/22 1525  BP: 120/60  Pulse: 70  Resp: 16  SpO2: 96%   Body mass index is 26.39 kg/m.  Physical Exam Vitals and nursing note reviewed.  Constitutional:       General: He is not in acute distress.    Appearance: He is well-developed.  HENT:     Head: Normocephalic and atraumatic.     Mouth/Throat:     Mouth: Mucous membranes are moist.     Pharynx: Oropharynx is clear.  Eyes:     Conjunctiva/sclera: Conjunctivae normal.  Cardiovascular:  Rate and Rhythm: Normal rate and regular rhythm.     Pulses:          Dorsalis pedis pulses are 2+ on the right side and 2+ on the left side.     Heart sounds: No murmur heard.    Comments: Trace pitting LE edema, bilateral. Pulmonary:     Effort: Pulmonary effort is normal. No respiratory distress.     Breath sounds: Normal breath sounds.  Abdominal:     Palpations: Abdomen is soft. There is no hepatomegaly or mass.     Tenderness: There is no abdominal tenderness.  Lymphadenopathy:     Cervical: No cervical adenopathy.  Skin:    General: Skin is warm.     Findings: No erythema or rash.  Neurological:     Mental Status: He is alert. Mental status is at baseline.     Comments: Antalgic gait, not assisted.  Psychiatric:        Mood and Affect: Mood and affect normal.     ASSESSMENT AND PLAN:  Wheeler was seen today for medical management of chronic issues and insomnia.  Diagnoses and all orders for this visit:  Essential hypertension  Insomnia, unspecified type -     doxepin (SINEQUAN) 10 MG/ML solution; Take 0.5-1 mLs (5-10 mg total) by mouth at bedtime.    No orders of the defined types were placed in this encounter.   No problem-specific Assessment & Plan notes found for this encounter.   Return in about 6 months (around 12/12/2022) for chronic problems.  Diyana Starrett G. [place], MD  [facility]. [place] office.

## 2022-06-10 ENCOUNTER — Encounter: Payer: Self-pay | Admitting: Family Medicine

## 2022-06-11 ENCOUNTER — Encounter: Payer: Self-pay | Admitting: Family Medicine

## 2022-06-11 ENCOUNTER — Ambulatory Visit (INDEPENDENT_AMBULATORY_CARE_PROVIDER_SITE_OTHER): Payer: Medicare Other | Admitting: Family Medicine

## 2022-06-11 VITALS — BP 120/60 | HR 70 | Resp 16 | Ht 67.0 in | Wt 168.5 lb

## 2022-06-11 DIAGNOSIS — G4700 Insomnia, unspecified: Secondary | ICD-10-CM | POA: Diagnosis not present

## 2022-06-11 DIAGNOSIS — I10 Essential (primary) hypertension: Secondary | ICD-10-CM

## 2022-06-11 MED ORDER — DOXEPIN HCL 10 MG/ML PO CONC
5.0000 mg | Freq: Every day | ORAL | 0 refills | Status: DC
Start: 2022-06-11 — End: 2022-07-07

## 2022-06-11 NOTE — Patient Instructions (Addendum)
A few things to remember from today's visit:  Essential hypertension  Insomnia, unspecified type - Plan: doxepin (SINEQUAN) 10 MG/ML solution  Can also take Melatonin up to 15 mg 1 hour before bedtime. Stop Trazodone. Doxepin 5 (0.5 ml) and increase to 1 ml in 5 days of not helping.  If you need refills for medications you take chronically, please call your pharmacy. Do not use My Chart to request refills or for acute issues that need immediate attention. If you send a my chart message, it may take a few days to be addressed, specially if I am not in the office.  Please be sure medication list is accurate. If a new problem present, please set up appointment sooner than planned today.

## 2022-06-11 NOTE — Telephone Encounter (Signed)
He is coming in today to see you.

## 2022-06-12 NOTE — Assessment & Plan Note (Signed)
BP adequately controlled. Continue amlodipine 5 mg daily, metoprolol succinate 50 mg daily, and losartan-HCTZ 50-12.5 mg daily as well as law-salt diet.

## 2022-06-12 NOTE — Assessment & Plan Note (Signed)
Problem is not well controlled, has been gradually getting worse. We discussed treatment options, given his co morbilities we do not have many. Trazodone did not help, we could increase dose but he would like to try something else. Doxepin side effects discussed, recommend starting with 5 mg (0.5 ml) and increase to 10 mg in 5 days if needed. Adequate sleep hygiene. Caution with diphenhydramine containing medications. He can add melatonin back 1-2 hours before bedtime up to 15 mg. Will consider Mirtazapine if Doxepin does not help.

## 2022-06-14 DIAGNOSIS — N1832 Chronic kidney disease, stage 3b: Secondary | ICD-10-CM | POA: Diagnosis not present

## 2022-06-17 ENCOUNTER — Telehealth: Payer: Self-pay

## 2022-06-17 ENCOUNTER — Other Ambulatory Visit (HOSPITAL_COMMUNITY): Payer: Self-pay

## 2022-06-17 NOTE — Telephone Encounter (Signed)
Patient Advocate Encounter  Prior Authorization for Doxepin HCl 10MG /ML concentrate has been approved through [employer].  Key: [id number]    Effective: 06-11-2022 to 06-16-2023

## 2022-06-23 DIAGNOSIS — E1122 Type 2 diabetes mellitus with diabetic chronic kidney disease: Secondary | ICD-10-CM | POA: Diagnosis not present

## 2022-06-23 DIAGNOSIS — N1832 Chronic kidney disease, stage 3b: Secondary | ICD-10-CM | POA: Diagnosis not present

## 2022-06-23 DIAGNOSIS — I129 Hypertensive chronic kidney disease with stage 1 through stage 4 chronic kidney disease, or unspecified chronic kidney disease: Secondary | ICD-10-CM | POA: Diagnosis not present

## 2022-06-23 DIAGNOSIS — Z87448 Personal history of other diseases of urinary system: Secondary | ICD-10-CM | POA: Diagnosis not present

## 2022-06-23 DIAGNOSIS — M1990 Unspecified osteoarthritis, unspecified site: Secondary | ICD-10-CM | POA: Diagnosis not present

## 2022-06-28 ENCOUNTER — Telehealth: Payer: Self-pay | Admitting: Family Medicine

## 2022-06-28 NOTE — Telephone Encounter (Signed)
Patient dropped off document  Application for Entry to [facility] , to be filled out by provider. Patient requested to send it via Fax within 7-days. Document is located in providers tray at front office.Please advise at [facility] [phone]

## 2022-06-30 ENCOUNTER — Telehealth: Payer: Self-pay | Admitting: Family Medicine

## 2022-06-30 NOTE — Telephone Encounter (Signed)
Patient dropped off document Insurance Form, to be filled out by provider. Patient requested to send it via Fax within 7-days. Document is located in providers tray at front office.Please advise at Mobile [phone] (mobile)

## 2022-07-07 ENCOUNTER — Other Ambulatory Visit: Payer: Self-pay | Admitting: Family Medicine

## 2022-07-07 DIAGNOSIS — G4700 Insomnia, unspecified: Secondary | ICD-10-CM

## 2022-07-07 MED ORDER — DOXEPIN HCL 10 MG PO CAPS
10.0000 mg | ORAL_CAPSULE | Freq: Every day | ORAL | 2 refills | Status: DC
Start: 2022-07-07 — End: 2022-07-30

## 2022-07-08 DIAGNOSIS — M79671 Pain in right foot: Secondary | ICD-10-CM | POA: Diagnosis not present

## 2022-07-18 ENCOUNTER — Other Ambulatory Visit: Payer: Self-pay | Admitting: Neurology

## 2022-07-18 ENCOUNTER — Other Ambulatory Visit: Payer: Self-pay | Admitting: Family Medicine

## 2022-07-18 DIAGNOSIS — G4700 Insomnia, unspecified: Secondary | ICD-10-CM

## 2022-07-19 DIAGNOSIS — R413 Other amnesia: Secondary | ICD-10-CM | POA: Diagnosis not present

## 2022-07-26 ENCOUNTER — Telehealth: Payer: Self-pay | Admitting: Family Medicine

## 2022-07-26 DIAGNOSIS — H903 Sensorineural hearing loss, bilateral: Secondary | ICD-10-CM | POA: Diagnosis not present

## 2022-07-26 DIAGNOSIS — Z7189 Other specified counseling: Secondary | ICD-10-CM

## 2022-07-26 NOTE — Telephone Encounter (Signed)
Pt requesting a referral to [facility] (Dr Sherron Ales) phone [phone] fax [phone]. Says patient has not been by PCP for the condition. Says patient has had a hearing test and needs hearing aids.

## 2022-07-26 NOTE — Telephone Encounter (Signed)
Referral placed.

## 2022-07-29 DIAGNOSIS — M722 Plantar fascial fibromatosis: Secondary | ICD-10-CM | POA: Diagnosis not present

## 2022-07-30 ENCOUNTER — Other Ambulatory Visit: Payer: Self-pay

## 2022-07-30 DIAGNOSIS — G4700 Insomnia, unspecified: Secondary | ICD-10-CM

## 2022-07-30 MED ORDER — DOXEPIN HCL 10 MG PO CAPS
10.0000 mg | ORAL_CAPSULE | Freq: Every day | ORAL | 1 refills | Status: DC
Start: 2022-07-30 — End: 2022-08-09

## 2022-08-04 DIAGNOSIS — R413 Other amnesia: Secondary | ICD-10-CM | POA: Diagnosis not present

## 2022-08-05 ENCOUNTER — Encounter: Payer: Self-pay | Admitting: Family Medicine

## 2022-08-05 NOTE — Telephone Encounter (Signed)
Pt wife is calling checking on the form for medicare concerning Continuous Blood Gluc Sensor (DEXCOM G7 SENSOR) MISC . Pt wife dropped the form off on 06-30-2022 to be completed and return back to medicare

## 2022-08-05 NOTE — Telephone Encounter (Signed)
Error/njr °

## 2022-08-05 NOTE — Telephone Encounter (Signed)
Pt wife is calling checking on the form for medicare concerning Continuous Blood Gluc Sensor (DEXCOM G7 SENSOR) MISC . The form was dropped off on 06-30-2022

## 2022-08-06 ENCOUNTER — Other Ambulatory Visit: Payer: Self-pay

## 2022-08-06 MED ORDER — DEXCOM G7 SENSOR MISC: 3 refills | Status: DC

## 2022-08-06 NOTE — Progress Notes (Unsigned)
ACUTE VISIT No chief complaint on file.  HPI: [name] is a 77 y.o. male, who is here today complaining of *** HPI  Review of Systems See other pertinent positives and negatives in HPI.  Current Outpatient Medications on File Prior to Visit  Medication Sig Dispense Refill   atorvastatin (LIPITOR) 40 MG tablet Take 1 tablet (40 mg total) by mouth daily. (Patient taking differently: Take 40 mg by mouth at bedtime.) 90 tablet 3   calcium carbonate (TUMS - DOSED IN MG ELEMENTAL CALCIUM) 500 MG chewable tablet Chew 2 tablets by mouth daily as needed for indigestion or heartburn.     Cholecalciferol (VITAMIN D3) 1000 units CAPS Take 2,000 Units by mouth daily. 1 capsule 0   Coenzyme Q10 (COQ10) 100 MG CAPS Take 100 mg by mouth daily.     Continuous Glucose Sensor (DEXCOM G7 SENSOR) MISC Use to check sugar continuously.Dx:E11.9 3 each 3   donepezil (ARICEPT) 10 MG tablet Take 1 tablet (10 mg total) by mouth at bedtime. 90 tablet 3   doxepin (SINEQUAN) 10 MG capsule Take 1 capsule (10 mg total) by mouth at bedtime. 90 capsule 1   FARXIGA 10 MG TABS tablet Take 10 mg by mouth in the morning.     ferrous gluconate (FERGON) 324 MG tablet Take 324 mg by mouth every other day.     Insulin Glargine (BASAGLAR KWIKPEN) 100 UNIT/ML 10 units (titrate as directed up to 50 units per day) Subcutaneous once a day for 30 days     losartan-hydrochlorothiazide (HYZAAR) 100-25 MG tablet Take 1 tablet by mouth daily.     metoprolol succinate (TOPROL-XL) 50 MG 24 hr tablet TAKE 1 TABLET DAILY, WITH  OR IMMEDIATELY FOLLOWING A MEAL 60 tablet 0   NON FORMULARY Take 2 capsules by mouth See admin instructions. Vicks ZzzQuil Pure Zzzs Triple Action Gummies- Chew 2 gummies by mouth every night at bedtime     pantoprazole (PROTONIX) 20 MG tablet Take 20 mg by mouth daily.     TYLENOL 500 MG tablet Take 1,000 mg by mouth every 6 (six) hours as needed for headache.     No current facility-administered  medications on file prior to visit.    Past Medical History:  Diagnosis Date   Acute blood loss anemia 06/27/2012   Anemia    Arthritis    CKD (chronic kidney disease)    Constipation due to pain medication    needs stool softner while on pain medication   Diabetes mellitus    Duodenal ulcer 08/13/2015   2014  - Dr Kaplan/GI   Duodenal ulcer with hemorrhage 08/13/2015   2014  - Dr Kaplan/GI   Dyspnea 06/19/2012   GERD (gastroesophageal reflux disease)    H/O: osteoarthritis    Heart murmur    History of kidney stones    History of prostate cancer    History of urethral stricture    Hypercholesterolemia    Hypertension    Melena 06/19/2012   PONV (postoperative nausea and vomiting)    Prostate cancer (HCC)    prostate   S/P TAVR (transcatheter aortic valve replacement) 08/12/2020   s/p TAVR with a 29 mm Edwards Sapien 3 THV via the TF approach by Dr. Excell Seltzer and Dr. Laneta Simmers   Severe aortic stenosis    Spinal stenosis    Ulcer duodenal hemorrhage    Allergies  Allergen Reactions   Aspirin Other (See Comments)    Bleeding ulcer   Diclofenac Other (See  Comments)    Bleeding ulcer   Excedrin Tension Headache [Acetaminophen-Caffeine] Other (See Comments)    Bleeding ulcer   Motrin [Ibuprofen] Other (See Comments)    Bleeding ulcer    Social History   Socioeconomic History   Marital status: Married    Spouse name: Not on file   Number of children: 2   Years of education: 16   Highest education level: Bachelor's degree (e.g., BA, AB, BS)  Occupational History   Occupation: Retired  Tobacco Use   Smoking status: Former    Packs/day: 1.50    Years: 20.00    Additional pack years: 0.00    Total pack years: 30.00    Types: Cigarettes    Quit date: 02/09/1980    Years since quitting: 42.5   Smokeless tobacco: Never  Vaping Use   Vaping Use: Never used  Substance and Sexual Activity   Alcohol use: No   Drug use: No   Sexual activity: Not Currently  Other Topics  Concern   Not on file  Social History Narrative   Retired from Engineer, structural (boats).    Lives with wife,    two children in [place].    Enjoys reading, boating, travelling to [place] in the winter with wife.   Social Determinants of Health   Financial Resource Strain: Low Risk  (12/24/2021)   Overall Financial Resource Strain (CARDIA)    Difficulty of Paying Living Expenses: Not hard at all  Food Insecurity: No Food Insecurity (04/21/2022)   Hunger Vital Sign    Worried About Running Out of Food in the Last Year: Never true    Ran Out of Food in the Last Year: Never true  Transportation Needs: No Transportation Needs (04/21/2022)   PRAPARE - Administrator, Civil Service (Medical): No    Lack of Transportation (Non-Medical): No  Physical Activity: Inactive (12/24/2021)   Exercise Vital Sign    Days of Exercise per Week: 0 days    Minutes of Exercise per Session: 0 min  Stress: No Stress Concern Present (12/24/2021)   [employer] of Occupational Health - Occupational Stress Questionnaire    Feeling of Stress : Not at all  Social Connections: Moderately Isolated (12/24/2021)   Social Connection and Isolation Panel [NHANES]    Frequency of Communication with Friends and Family: More than three times a week    Frequency of Social Gatherings with Friends and Family: More than three times a week    Attends Religious Services: Never    Database administrator or Organizations: No    Attends Banker Meetings: Never    Marital Status: Married    There were no vitals filed for this visit. There is no height or weight on file to calculate BMI.  Physical Exam  ASSESSMENT AND PLAN: There are no diagnoses linked to this encounter.  No follow-ups on file.  Sophi Calligan G. [place], MD  [facility]. [place] office.  Discharge Instructions   None

## 2022-08-06 NOTE — Telephone Encounter (Signed)
Responded via mychart

## 2022-08-09 ENCOUNTER — Encounter: Payer: Self-pay | Admitting: Family Medicine

## 2022-08-09 ENCOUNTER — Ambulatory Visit (INDEPENDENT_AMBULATORY_CARE_PROVIDER_SITE_OTHER): Payer: Medicare Other | Admitting: Family Medicine

## 2022-08-09 VITALS — BP 128/70 | HR 64 | Temp 97.9°F | Resp 16 | Ht 67.0 in | Wt 171.4 lb

## 2022-08-09 DIAGNOSIS — F0283 Dementia in other diseases classified elsewhere, unspecified severity, with mood disturbance: Secondary | ICD-10-CM

## 2022-08-09 DIAGNOSIS — N1832 Chronic kidney disease, stage 3b: Secondary | ICD-10-CM | POA: Diagnosis not present

## 2022-08-09 DIAGNOSIS — I10 Essential (primary) hypertension: Secondary | ICD-10-CM | POA: Diagnosis not present

## 2022-08-09 DIAGNOSIS — G4700 Insomnia, unspecified: Secondary | ICD-10-CM

## 2022-08-09 DIAGNOSIS — G309 Alzheimer's disease, unspecified: Secondary | ICD-10-CM | POA: Diagnosis not present

## 2022-08-09 DIAGNOSIS — E1122 Type 2 diabetes mellitus with diabetic chronic kidney disease: Secondary | ICD-10-CM

## 2022-08-09 DIAGNOSIS — Z7984 Long term (current) use of oral hypoglycemic drugs: Secondary | ICD-10-CM

## 2022-08-09 MED ORDER — AMLODIPINE BESYLATE 2.5 MG PO TABS
2.5000 mg | ORAL_TABLET | Freq: Every day | ORAL | 0 refills | Status: DC
Start: 2022-08-09 — End: 2022-10-20

## 2022-08-09 MED ORDER — DEXCOM G7 SENSOR MISC: 3 refills | Status: DC

## 2022-08-09 NOTE — Patient Instructions (Addendum)
A few things to remember from today's visit:  Insomnia, unspecified type  Essential hypertension - Plan: amLODipine (NORVASC) 2.5 MG tablet  Type 2 diabetes mellitus with stage 3b chronic kidney disease, without long-term current use of insulin (HCC) - Plan: Continuous Glucose Sensor (DEXCOM G7 SENSOR) MISC  Resume Trazodone 50 mg 45-60 min before bedtime and titrate up to 75 mg, 100 mg,150 mg,and 200 mg if needed for insomnia. Dose can be changed weekly as tolerated. No cold medications for sleep. Continue monitoring blood pressure. Amlodipine 2.5 mg added today. Keep appt with kidney specialist.  If you need refills for medications you take chronically, please call your pharmacy. Do not use My Chart to request refills or for acute issues that need immediate attention. If you send a my chart message, it may take a few days to be addressed, specially if I am not in the office.  Please be sure medication list is accurate. If a new problem present, please set up appointment sooner than planned today.

## 2022-08-12 NOTE — Assessment & Plan Note (Signed)
Problem is still not well controlled, Doxepin 10 mg has not helped. Because the risk of adverse effects, medications like Ambien or benzos are not recommended. Some of his medications could aggravate problem, Aricept included. He tried Trazodone 50 mg and his wife has some at home, would like to try again. He can take it 30-60 min before bedtime and titrate up to 75 mg,100 mg, 150,and 200 mg increasing dose weekly as needed. We discussed side effects of medications. Other options if Trazodone does not help, Seroquel, Risperdal, or Mirtazapine, We reviewed possible adverse effects and impact on some of his co morbilities. Continue good sleep hygiene. Advised against use of benadryl or cold and flu Equate.

## 2022-08-12 NOTE — Assessment & Plan Note (Addendum)
Related to years Hx of HTN and DM II as well as chronic use of NSAID's. Following with nephrologist. Dx criteria reviewed. We discussed the importance of better glucose and BP control. Continue avoiding NSAID's. Continue low salt diet and adequate hydration. He has an appt with nephrologist in a few weeks.

## 2022-08-12 NOTE — Assessment & Plan Note (Addendum)
Currently on Aricept 10 mg daily. His wife is reporting gradually increasing irritability and episodes of "agitation."  We discussed Dx.prognosis,and treatment options. If behavioral issues get worse, his wife was instructed to arrange appt with neurologist before planned.

## 2022-08-12 NOTE — Assessment & Plan Note (Addendum)
Last HgA1C in 05/2022 8.4. Following with endocrinologist. Currently on Farxiga and [address] Dexcom G7 sent to his pharmacy.

## 2022-08-12 NOTE — Assessment & Plan Note (Signed)
Today BP adequately controlled but reporting elevated BP readings at home, 150-160's/80-90's and one DBP 113 after taking cold medication to help him sleep. Stressed the importance of continue avoiding NSAID's and avoid cold medication. We discussed possible complications of elevated BP. Today Amlodipine 2.5 mg added. No changes in Losartan-hydrochlorothiazide or Metoprolol succinate dose. Continue monitoring BP regularly. Keep appt with nephrologist. Instructed about warning signs.

## 2022-08-26 DIAGNOSIS — M722 Plantar fascial fibromatosis: Secondary | ICD-10-CM | POA: Diagnosis not present

## 2022-08-30 DIAGNOSIS — D0439 Carcinoma in situ of skin of other parts of face: Secondary | ICD-10-CM | POA: Diagnosis not present

## 2022-08-30 DIAGNOSIS — L570 Actinic keratosis: Secondary | ICD-10-CM | POA: Diagnosis not present

## 2022-08-30 DIAGNOSIS — D225 Melanocytic nevi of trunk: Secondary | ICD-10-CM | POA: Diagnosis not present

## 2022-08-30 DIAGNOSIS — L814 Other melanin hyperpigmentation: Secondary | ICD-10-CM | POA: Diagnosis not present

## 2022-08-30 DIAGNOSIS — D1801 Hemangioma of skin and subcutaneous tissue: Secondary | ICD-10-CM | POA: Diagnosis not present

## 2022-08-30 DIAGNOSIS — Z85828 Personal history of other malignant neoplasm of skin: Secondary | ICD-10-CM | POA: Diagnosis not present

## 2022-08-30 DIAGNOSIS — L218 Other seborrheic dermatitis: Secondary | ICD-10-CM | POA: Diagnosis not present

## 2022-08-30 DIAGNOSIS — L821 Other seborrheic keratosis: Secondary | ICD-10-CM | POA: Diagnosis not present

## 2022-08-30 DIAGNOSIS — C44712 Basal cell carcinoma of skin of right lower limb, including hip: Secondary | ICD-10-CM | POA: Diagnosis not present

## 2022-08-31 DIAGNOSIS — Z8639 Personal history of other endocrine, nutritional and metabolic disease: Secondary | ICD-10-CM | POA: Diagnosis not present

## 2022-08-31 DIAGNOSIS — N1832 Chronic kidney disease, stage 3b: Secondary | ICD-10-CM | POA: Diagnosis not present

## 2022-08-31 DIAGNOSIS — Z794 Long term (current) use of insulin: Secondary | ICD-10-CM | POA: Diagnosis not present

## 2022-08-31 DIAGNOSIS — E1165 Type 2 diabetes mellitus with hyperglycemia: Secondary | ICD-10-CM | POA: Diagnosis not present

## 2022-08-31 DIAGNOSIS — E1122 Type 2 diabetes mellitus with diabetic chronic kidney disease: Secondary | ICD-10-CM | POA: Diagnosis not present

## 2022-09-02 ENCOUNTER — Telehealth: Payer: Self-pay | Admitting: Family Medicine

## 2022-09-02 NOTE — Telephone Encounter (Signed)
Patient dropped off document  Resident Entry Required Form from Doctor , to be filled out by provider. Patient requested to send it back via Fax within 5-days. Document is located in providers tray at front office.Please advise at Mobile [phone] (mobile)

## 2022-09-03 NOTE — Telephone Encounter (Signed)
Received

## 2022-09-03 NOTE — Telephone Encounter (Signed)
Signed. Thanks, BJ

## 2022-09-03 NOTE — Telephone Encounter (Signed)
In your bin to be signed.  

## 2022-09-04 ENCOUNTER — Other Ambulatory Visit: Payer: Self-pay | Admitting: Internal Medicine

## 2022-09-06 NOTE — Telephone Encounter (Signed)
Faxed

## 2022-09-13 DIAGNOSIS — E663 Overweight: Secondary | ICD-10-CM | POA: Diagnosis not present

## 2022-09-13 DIAGNOSIS — N1832 Chronic kidney disease, stage 3b: Secondary | ICD-10-CM | POA: Diagnosis not present

## 2022-09-13 DIAGNOSIS — E1122 Type 2 diabetes mellitus with diabetic chronic kidney disease: Secondary | ICD-10-CM | POA: Diagnosis not present

## 2022-09-13 DIAGNOSIS — Z8546 Personal history of malignant neoplasm of prostate: Secondary | ICD-10-CM | POA: Diagnosis not present

## 2022-09-13 DIAGNOSIS — M1990 Unspecified osteoarthritis, unspecified site: Secondary | ICD-10-CM | POA: Diagnosis not present

## 2022-09-13 DIAGNOSIS — I129 Hypertensive chronic kidney disease with stage 1 through stage 4 chronic kidney disease, or unspecified chronic kidney disease: Secondary | ICD-10-CM | POA: Diagnosis not present

## 2022-09-19 ENCOUNTER — Other Ambulatory Visit: Payer: Self-pay | Admitting: Internal Medicine

## 2022-09-28 ENCOUNTER — Encounter: Payer: Self-pay | Admitting: Adult Health

## 2022-09-28 ENCOUNTER — Ambulatory Visit (INDEPENDENT_AMBULATORY_CARE_PROVIDER_SITE_OTHER): Payer: Medicare Other | Admitting: Adult Health

## 2022-09-28 VITALS — BP 112/60 | HR 64 | Temp 97.9°F | Ht 67.0 in | Wt 172.0 lb

## 2022-09-28 DIAGNOSIS — N3001 Acute cystitis with hematuria: Secondary | ICD-10-CM

## 2022-09-28 LAB — POCT URINALYSIS DIPSTICK
Bilirubin, UA: POSITIVE
Blood, UA: POSITIVE
Glucose, UA: POSITIVE — AB
Ketones, UA: NEGATIVE
Nitrite, UA: POSITIVE
Protein, UA: POSITIVE — AB
Spec Grav, UA: 1.015 (ref 1.010–1.025)
Urobilinogen, UA: 1 E.U./dL
pH, UA: 5.5 (ref 5.0–8.0)

## 2022-09-28 MED ORDER — SULFAMETHOXAZOLE-TRIMETHOPRIM 800-160 MG PO TABS
1.0000 | ORAL_TABLET | Freq: Two times a day (BID) | ORAL | 0 refills | Status: AC
Start: 2022-09-28 — End: 2022-10-05

## 2022-09-28 NOTE — Progress Notes (Signed)
Subjective:    Patient ID: Daryl Webb, male    DOB: 02/28/1945, 77 y.o.   MRN: [id number]  Back Pain Associated symptoms include chest pain and dysuria.  Urinary Frequency  Associated symptoms include frequency.  Chest Pain  Associated symptoms include back pain.  Dysuria  Associated symptoms include frequency.   77 year old male who  has a past medical history of Acute blood loss anemia (06/27/2012), Anemia, Arthritis, CKD (chronic kidney disease), Constipation due to pain medication, Diabetes mellitus, Duodenal ulcer (08/13/2015), Duodenal ulcer with hemorrhage (08/13/2015), Dyspnea (06/19/2012), GERD (gastroesophageal reflux disease), H/O: osteoarthritis, Heart murmur, History of kidney stones, History of prostate cancer, History of urethral stricture, Hypercholesterolemia, Hypertension, Melena (06/19/2012), PONV (postoperative nausea and vomiting), Prostate cancer (HCC), S/P TAVR (transcatheter aortic valve replacement) (08/12/2020), Severe aortic stenosis, Spinal stenosis, and Ulcer duodenal hemorrhage.  He presents to the office today for an acute issue. His symptoms started about 3 days. His symptoms include that of dysuria, frequency, low back pain and lower abdominal pain.   He denies fevers or chills.   He did have a UTI back in April 2024 and was treated with Bactrim, he did tolerate this well.    Review of Systems  Cardiovascular:  Positive for chest pain.  Genitourinary:  Positive for dysuria and frequency.  Musculoskeletal:  Positive for back pain.   See HPI   Past Medical History:  Diagnosis Date   Acute blood loss anemia 06/27/2012   Anemia    Arthritis    CKD (chronic kidney disease)    Constipation due to pain medication    needs stool softner while on pain medication   Diabetes mellitus    Duodenal ulcer 08/13/2015   2014  - Dr Kaplan/GI   Duodenal ulcer with hemorrhage 08/13/2015   2014  - Dr Kaplan/GI   Dyspnea 06/19/2012   GERD  (gastroesophageal reflux disease)    H/O: osteoarthritis    Heart murmur    History of kidney stones    History of prostate cancer    History of urethral stricture    Hypercholesterolemia    Hypertension    Melena 06/19/2012   PONV (postoperative nausea and vomiting)    Prostate cancer (HCC)    prostate   S/P TAVR (transcatheter aortic valve replacement) 08/12/2020   s/p TAVR with a 29 mm Edwards Sapien 3 THV via the TF approach by Dr. Excell Seltzer and Dr. Laneta Simmers   Severe aortic stenosis    Spinal stenosis    Ulcer duodenal hemorrhage     Social History   Socioeconomic History   Marital status: Married    Spouse name: Not on file   Number of children: 2   Years of education: 16   Highest education level: Bachelor's degree (e.g., BA, AB, BS)  Occupational History   Occupation: Retired  Tobacco Use   Smoking status: Former    Current packs/day: 0.00    Average packs/day: 1.5 packs/day for 20.0 years (30.0 ttl pk-yrs)    Types: Cigarettes    Start date: 02/09/1960    Quit date: 02/09/1980    Years since quitting: 42.6   Smokeless tobacco: Never  Vaping Use   Vaping status: Never Used  Substance and Sexual Activity   Alcohol use: No   Drug use: No   Sexual activity: Not Currently  Other Topics Concern   Not on file  Social History Narrative   Retired from Engineer, structural (boats).    Lives with wife,  two children in [place].    Enjoys reading, boating, travelling to [place] in the winter with wife.   Social Determinants of Health   Financial Resource Strain: Low Risk  (12/24/2021)   Overall Financial Resource Strain (CARDIA)    Difficulty of Paying Living Expenses: Not hard at all  Food Insecurity: No Food Insecurity (04/21/2022)   Hunger Vital Sign    Worried About Running Out of Food in the Last Year: Never true    Ran Out of Food in the Last Year: Never true  Transportation Needs: No Transportation Needs (04/21/2022)   PRAPARE - Scientist, research (physical sciences) (Medical): No    Lack of Transportation (Non-Medical): No  Physical Activity: Inactive (12/24/2021)   Exercise Vital Sign    Days of Exercise per Week: 0 days    Minutes of Exercise per Session: 0 min  Stress: No Stress Concern Present (12/24/2021)   [employer] of Occupational Health - Occupational Stress Questionnaire    Feeling of Stress : Not at all  Social Connections: Moderately Isolated (12/24/2021)   Social Connection and Isolation Panel [NHANES]    Frequency of Communication with Friends and Family: More than three times a week    Frequency of Social Gatherings with Friends and Family: More than three times a week    Attends Religious Services: Never    Database administrator or Organizations: No    Attends Banker Meetings: Never    Marital Status: Married  Catering manager Violence: Not At Risk (12/24/2021)   Humiliation, Afraid, Rape, and Kick questionnaire    Fear of Current or Ex-Partner: No    Emotionally Abused: No    Physically Abused: No    Sexually Abused: No    Past Surgical History:  Procedure Laterality Date   ARTHROPLASTY  2004   right knee Dr. Candie Chroman SURGERY     BIOPSY  11/21/2021   Procedure: BIOPSY;  Surgeon: Sherrilyn Rist, MD;  Location: [facility];  Service: Gastroenterology;;   CARDIAC CATHETERIZATION     CARPAL TUNNEL RELEASE     right hand,wrist Dr. Teressa Senter   CYSTOSCOPY N/A 09/20/2012   Procedure: Bluford Kaufmann;  Surgeon: Crecencio Mc, MD;  Location: [facility];  Service: Urology;  Laterality: N/A;   ESOPHAGOGASTRODUODENOSCOPY N/A 11/21/2021   Procedure: ESOPHAGOGASTRODUODENOSCOPY (EGD);  Surgeon: Sherrilyn Rist, MD;  Location: [facility];  Service: Gastroenterology;  Laterality: N/A;   ESOPHAGOGASTRODUODENOSCOPY (EGD) WITH PROPOFOL N/A 11/30/2021   Procedure: ESOPHAGOGASTRODUODENOSCOPY (EGD) WITH PROPOFOL;  Surgeon: Beverley Fiedler, MD;  Location: [facility];  Service: Gastroenterology;   Laterality: N/A;   HEMOSTASIS CLIP PLACEMENT  11/30/2021   Procedure: HEMOSTASIS CLIP PLACEMENT;  Surgeon: Beverley Fiedler, MD;  Location: [facility];  Service: Gastroenterology;;   INGUINAL HERNIA REPAIR Right 01/14/2022   Procedure: RIGHT OPEN INGUINAL HERNIA REPAIR WITH MESH;  Surgeon: Kinsinger, De Blanch, MD;  Location: [facility];  Service: General;  Laterality: Right;   JOINT REPLACEMENT Right    KNEE ARTHROSCOPY Right    LUMBAR LAMINECTOMY  02/08/2013   PROSTATECTOMY     radical   RIGHT/LEFT HEART CATH AND CORONARY ANGIOGRAPHY N/A 05/08/2020   Procedure: RIGHT/LEFT HEART CATH AND CORONARY ANGIOGRAPHY;  Surgeon: Tonny Bollman, MD;  Location: [facility] INVAS[facility] CV LAB;  Service: Cardiovascular;  Laterality: N/A;   TEE WITHOUT CARDIOVERSION N/A 08/12/2020   Procedure: TRANSESOPHAGEAL ECHOCARDIOGRAM (TEE);  Surgeon: Tonny Bollman, MD;  Location: [facility] INVASIVE CV LAB;  Service: Open Heart Surgery;  Laterality: N/A;   TOOTH EXTRACTION  12/2021   TRANSCATHETER AORTIC VALVE REPLACEMENT, TRANSFEMORAL N/A 08/12/2020   Procedure: TRANSCATHETER AORTIC VALVE REPLACEMENT, TRANSFEMORAL;  Surgeon: Tonny Bollman, MD;  Location: [facility] INVASIVE CV LAB;  Service: Open Heart Surgery;  Laterality: N/A;   VASECTOMY  1985    Family History  Problem Relation Age of Onset   Heart disease Mother    Heart failure Mother    Heart disease Father    Diabetes Father    Colon cancer Neg Hx    Esophageal cancer Neg Hx    Inflammatory bowel disease Neg Hx    Liver disease Neg Hx    Pancreatic cancer Neg Hx    Rectal cancer Neg Hx    Stomach cancer Neg Hx     Allergies  Allergen Reactions   Aspirin Other (See Comments)    Bleeding ulcer   Diclofenac Other (See Comments)    Bleeding ulcer   Excedrin Tension Headache [Acetaminophen-Caffeine] Other (See Comments)    Bleeding ulcer   Motrin [Ibuprofen] Other (See Comments)    Bleeding ulcer    Current Outpatient Medications on File Prior to Visit  Medication  Sig Dispense Refill   amLODipine (NORVASC) 2.5 MG tablet Take 1 tablet (2.5 mg total) by mouth daily. 90 tablet 0   atorvastatin (LIPITOR) 40 MG tablet TAKE 1 TABLET DAILY 90 tablet 0   calcium carbonate (TUMS - DOSED IN MG ELEMENTAL CALCIUM) 500 MG chewable tablet Chew 2 tablets by mouth daily as needed for indigestion or heartburn.     Cholecalciferol (VITAMIN D3) 1000 units CAPS Take 2,000 Units by mouth daily. 1 capsule 0   Coenzyme Q10 (COQ10) 100 MG CAPS Take 100 mg by mouth daily.     Continuous Glucose Sensor (DEXCOM G7 SENSOR) MISC Use to check sugar continuously.Dx:E11.9 3 each 3   donepezil (ARICEPT) 10 MG tablet Take 1 tablet (10 mg total) by mouth at bedtime. 90 tablet 3   FARXIGA 10 MG TABS tablet Take 10 mg by mouth in the morning.     ferrous gluconate (FERGON) 324 MG tablet Take 324 mg by mouth every other day.     Insulin Glargine (BASAGLAR KWIKPEN) 100 UNIT/ML 10 units (titrate as directed up to 50 units per day) Subcutaneous once a day for 30 days     losartan-hydrochlorothiazide (HYZAAR) 100-25 MG tablet Take 1 tablet by mouth daily.     metoprolol succinate (TOPROL-XL) 50 MG 24 hr tablet TAKE 1 TABLET DAILY, WITH  OR IMMEDIATELY FOLLOWING A MEAL 90 tablet 3   NON FORMULARY Take 2 capsules by mouth See admin instructions. Vicks ZzzQuil Pure Zzzs Triple Action Gummies- Chew 2 gummies by mouth every night at bedtime     pantoprazole (PROTONIX) 20 MG tablet Take 20 mg by mouth daily.     traZODone (DESYREL) 50 MG tablet Take 50 mg by mouth at bedtime.     TYLENOL 500 MG tablet Take 1,000 mg by mouth every 6 (six) hours as needed for headache.     No current facility-administered medications on file prior to visit.    BP 112/60   Pulse 64   Temp 97.9 F (36.6 C) (Oral)   Ht 5\' 7"  (1.702 m)   Wt 172 lb (78 kg)   SpO2 94%   BMI 26.94 kg/m       Objective:   Physical Exam Vitals reviewed.  Constitutional:      Appearance: Normal appearance.  He is obese.   Cardiovascular:     Rate and Rhythm: Normal rate and regular rhythm.     Pulses: Normal pulses.     Heart sounds: Normal heart sounds.  Pulmonary:     Effort: Pulmonary effort is normal.     Breath sounds: Normal breath sounds.  Abdominal:     General: Bowel sounds are normal. There is no distension.     Palpations: Abdomen is soft.     Tenderness: There is no abdominal tenderness. There is no right CVA tenderness or left CVA tenderness.  Musculoskeletal:        General: Normal range of motion.  Skin:    General: Skin is warm and dry.     Capillary Refill: Capillary refill takes less than 2 seconds.  Neurological:     General: No focal deficit present.     Mental Status: He is alert and oriented to person, place, and time.  Psychiatric:        Mood and Affect: Mood normal.        Behavior: Behavior normal.        Thought Content: Thought content normal.        Judgment: Judgment normal.        Assessment & Plan:  1. Acute cystitis with hematuria  - POC Urinalysis Dipstick + leuks, nit, uro, prot, blood, bili, and glu - Will send culture and treat with Bactrim  - May need referral to Urology if UTI's continue  - Culture, Urine; Future - sulfamethoxazole-trimethoprim (BACTRIM DS) 800-160 MG tablet; Take 1 tablet by mouth 2 (two) times daily for 7 days.  Dispense: 14 tablet; Refill: 0  Shirline Frees, NP

## 2022-09-30 LAB — URINE CULTURE
MICRO NUMBER:: 15355863
SPECIMEN QUALITY:: ADEQUATE

## 2022-10-04 ENCOUNTER — Telehealth: Payer: Self-pay | Admitting: Family Medicine

## 2022-10-04 NOTE — Telephone Encounter (Signed)
Spouse called to request a call back to go over Pt's labs.

## 2022-10-05 NOTE — Telephone Encounter (Signed)
Patient spouse notified of update  and verbalized understanding. 

## 2022-10-20 ENCOUNTER — Other Ambulatory Visit: Payer: Self-pay

## 2022-10-20 DIAGNOSIS — I10 Essential (primary) hypertension: Secondary | ICD-10-CM

## 2022-10-20 DIAGNOSIS — E1122 Type 2 diabetes mellitus with diabetic chronic kidney disease: Secondary | ICD-10-CM

## 2022-10-20 MED ORDER — AMLODIPINE BESYLATE 2.5 MG PO TABS
2.5000 mg | ORAL_TABLET | Freq: Every day | ORAL | 2 refills | Status: DC
Start: 2022-10-20 — End: 2022-10-25

## 2022-10-20 MED ORDER — DEXCOM G7 SENSOR MISC
3 refills | Status: DC
Start: 2022-10-20 — End: 2023-08-31

## 2022-10-21 DIAGNOSIS — H2513 Age-related nuclear cataract, bilateral: Secondary | ICD-10-CM | POA: Diagnosis not present

## 2022-10-21 DIAGNOSIS — H524 Presbyopia: Secondary | ICD-10-CM | POA: Diagnosis not present

## 2022-10-24 ENCOUNTER — Other Ambulatory Visit: Payer: Self-pay | Admitting: Family Medicine

## 2022-10-24 DIAGNOSIS — I10 Essential (primary) hypertension: Secondary | ICD-10-CM

## 2022-11-08 IMAGING — CR DG CHEST 2V
2 series · 2 of 2 positions shown · non-contrast
Comparison: 06/19/2012

CLINICAL DATA: Preop exam. Patient scheduled for transcatheter
aortic valve replacement.

EXAM:
CHEST - 2 VIEW

[w chest pa]
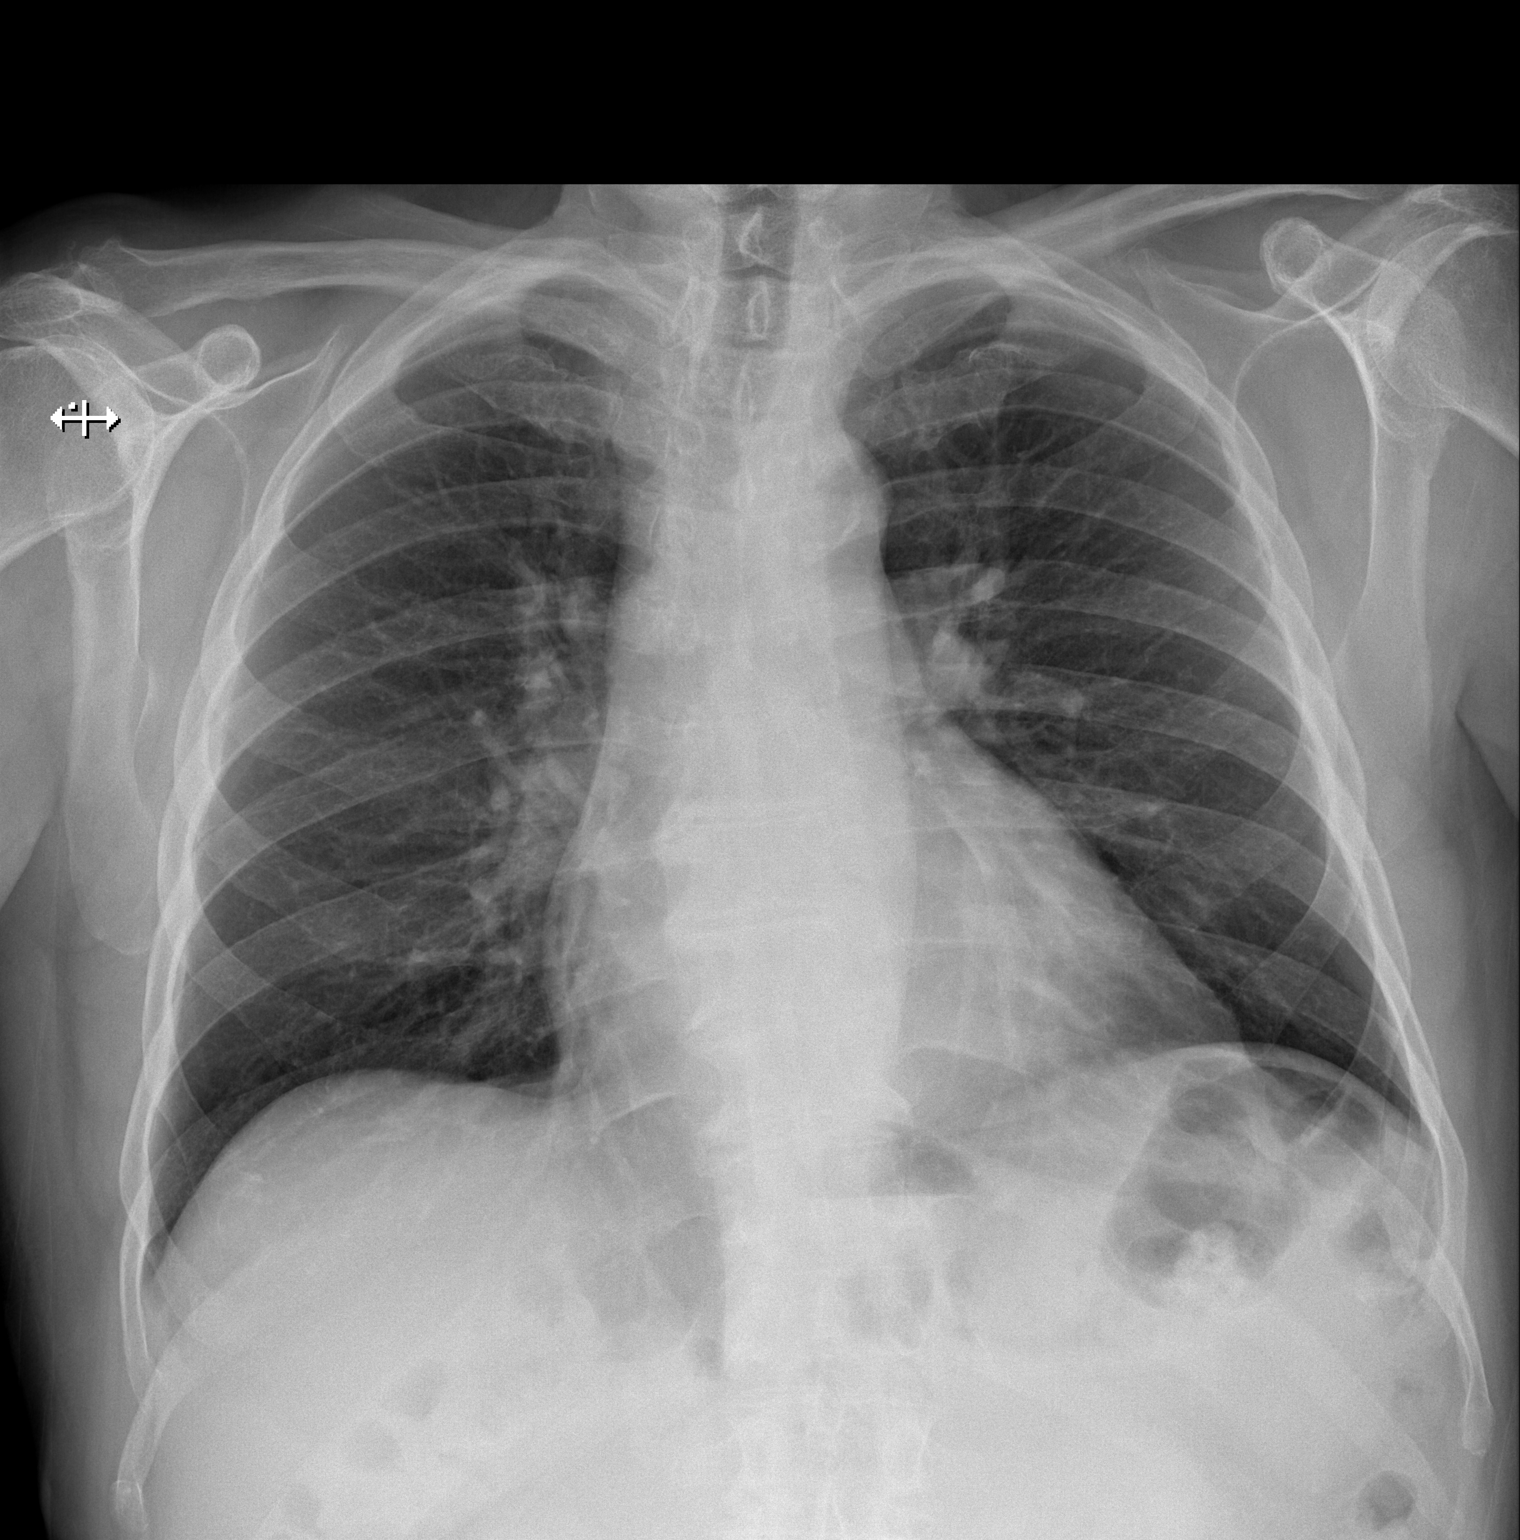

[w chest lat]
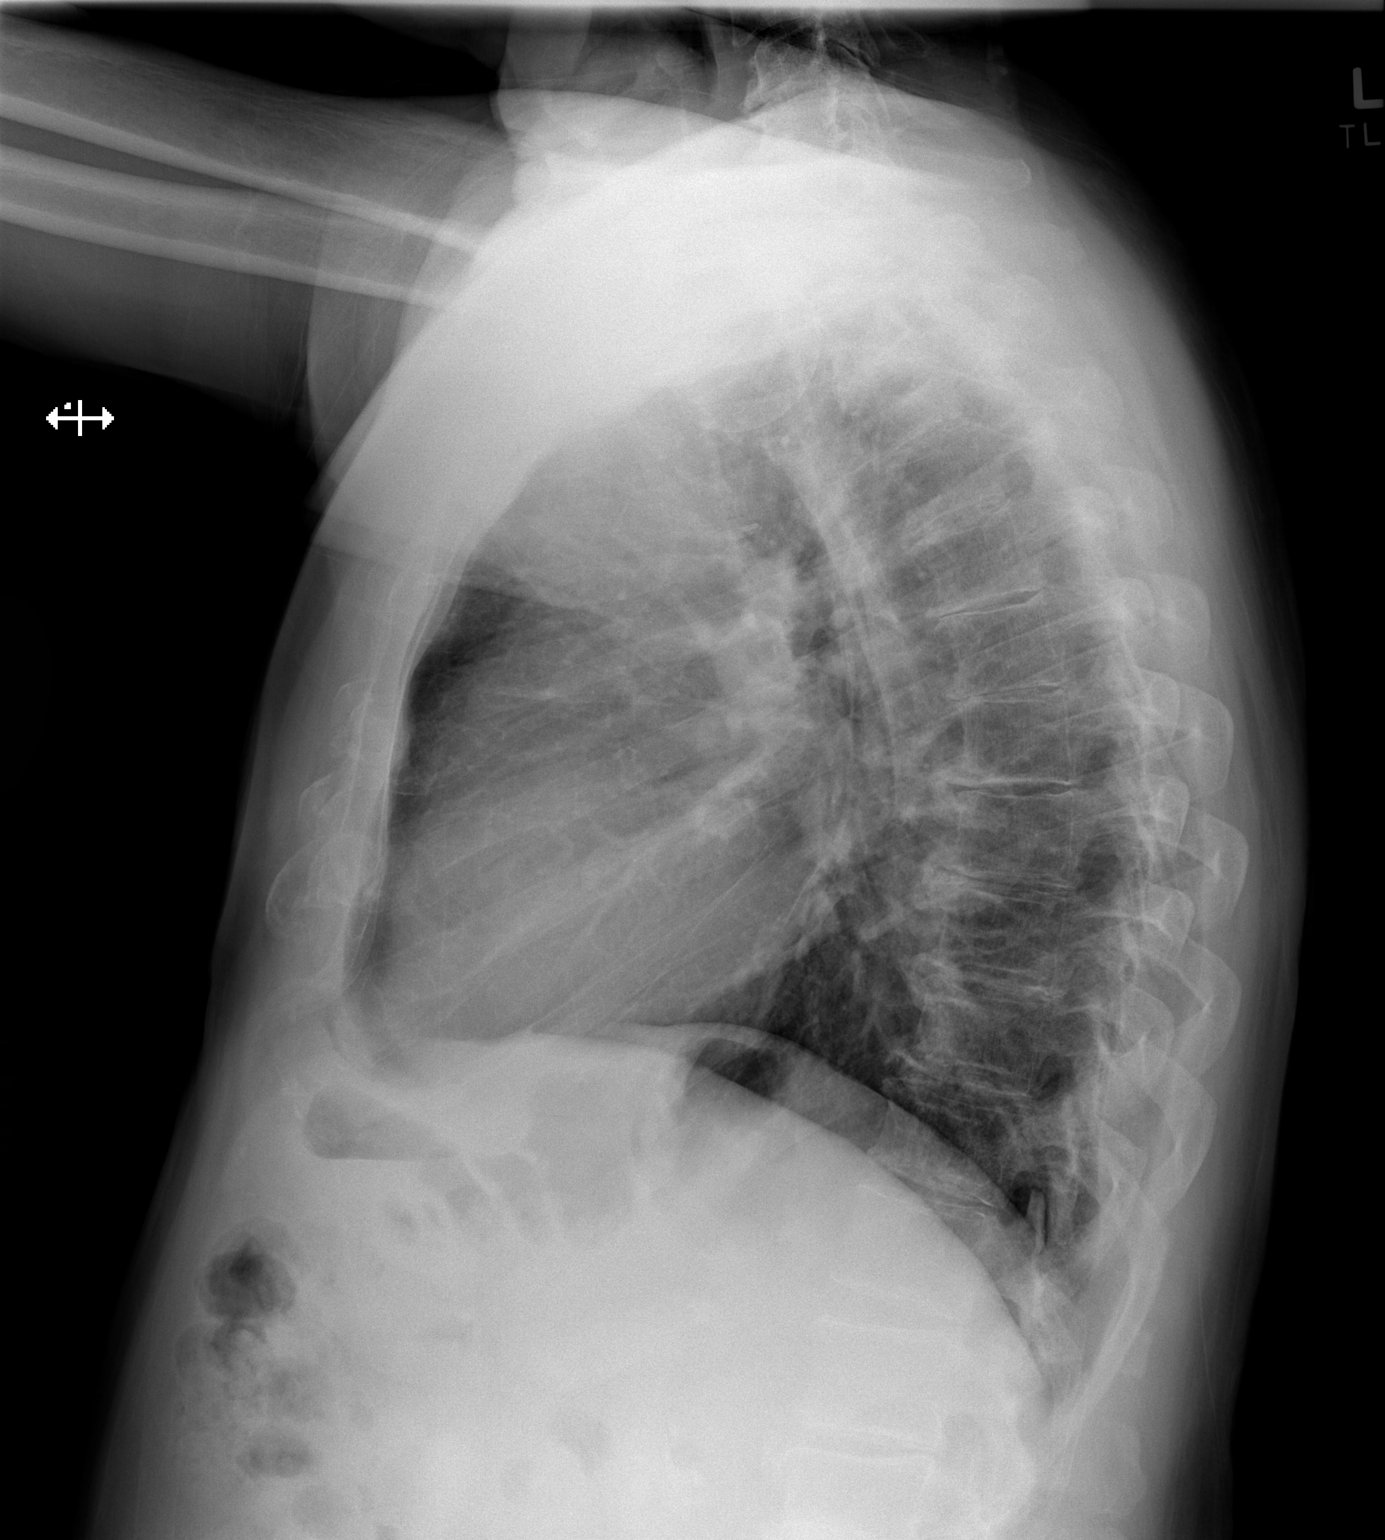

[2 of 2 positions shown; findings below may reference images not displayed]

FINDINGS: Heart size is normal. The lungs are free of focal consolidations and
pleural effusions. No pulmonary edema. There is atherosclerotic
calcification of the thoracic aorta. Midthoracic degenerative
changes.
IMPRESSION: No evidence for acute cardiopulmonary abnormality. Aortic
atherosclerosis. (GR7GB-RM6.6)

## 2022-11-15 ENCOUNTER — Telehealth: Payer: Self-pay | Admitting: Family Medicine

## 2022-11-15 NOTE — Telephone Encounter (Signed)
Faxed records to the number supplied by pt's wife. Rx's were already previously sent to [facility] on [address].

## 2022-11-15 NOTE — Telephone Encounter (Signed)
For Continuous Glucose Sensor (DEXCOM G7 SENSOR) MISC prescriber fax records to [phone]. Pt states they have had to change pharmacy because the original is closing. Thinks the PA was rejected because of the pharmacy change. Requests this goes to  [id number] [place], [place] - [address] Phone: [phone]  Fax: [phone]    Requests a call if there is clarification needed

## 2022-11-22 ENCOUNTER — Other Ambulatory Visit: Payer: Self-pay | Admitting: Family Medicine

## 2022-11-23 NOTE — Telephone Encounter (Signed)
He is not longer on Doxepin, reported no improvement last visit. He is supposed to be back to Trazodone. Thanks, BJ

## 2022-12-02 DIAGNOSIS — Z8639 Personal history of other endocrine, nutritional and metabolic disease: Secondary | ICD-10-CM | POA: Diagnosis not present

## 2022-12-02 DIAGNOSIS — B353 Tinea pedis: Secondary | ICD-10-CM | POA: Diagnosis not present

## 2022-12-02 DIAGNOSIS — E1165 Type 2 diabetes mellitus with hyperglycemia: Secondary | ICD-10-CM | POA: Diagnosis not present

## 2022-12-02 DIAGNOSIS — N1832 Chronic kidney disease, stage 3b: Secondary | ICD-10-CM | POA: Diagnosis not present

## 2022-12-02 DIAGNOSIS — E1122 Type 2 diabetes mellitus with diabetic chronic kidney disease: Secondary | ICD-10-CM | POA: Diagnosis not present

## 2022-12-02 DIAGNOSIS — Z794 Long term (current) use of insulin: Secondary | ICD-10-CM | POA: Diagnosis not present

## 2022-12-07 ENCOUNTER — Ambulatory Visit: Payer: Medicare Other | Attending: Internal Medicine | Admitting: Internal Medicine

## 2022-12-07 ENCOUNTER — Encounter: Payer: Self-pay | Admitting: Internal Medicine

## 2022-12-07 VITALS — BP 114/68 | HR 69 | Ht 67.0 in | Wt 173.4 lb

## 2022-12-07 DIAGNOSIS — E119 Type 2 diabetes mellitus without complications: Secondary | ICD-10-CM | POA: Diagnosis not present

## 2022-12-07 DIAGNOSIS — I2510 Atherosclerotic heart disease of native coronary artery without angina pectoris: Secondary | ICD-10-CM | POA: Insufficient documentation

## 2022-12-07 DIAGNOSIS — E782 Mixed hyperlipidemia: Secondary | ICD-10-CM | POA: Insufficient documentation

## 2022-12-07 DIAGNOSIS — I10 Essential (primary) hypertension: Secondary | ICD-10-CM | POA: Insufficient documentation

## 2022-12-07 DIAGNOSIS — Z952 Presence of prosthetic heart valve: Secondary | ICD-10-CM | POA: Insufficient documentation

## 2022-12-07 NOTE — Progress Notes (Unsigned)
OFFICE NOTE  Chief Complaint:  Follow-up  Primary Care Physician: [place], [name], MD  HPI:  Daryl Webb is a 77 y.o. male followed by Dr. [name] Sermons in the past for hypertension, dyslipidemia, type 2 diabetes and mild aortic sclerosis. His last echocardiogram was in 2015 which showed normal LV function and mild aortic sclerosis/borderline stenosis. Recent lab work was performed by myself indicating hemoglobin A1c of 6.7, creatinine 1.4 however he had a recent UTI, and cholesterol profile which was favorable with LDL 70 and total cholesterol 123. He is on atorvastatin 40 mg daily. He has no history of known coronary artery disease.  02/12/2016  Daryl Webb returns today for follow-up. He recently established a new primary care provider with Dr. Betty [place]. She performs some lab work including cholesterol which appears to be well-controlled with total cholesterol 113, LDL 58, triglycerides 130 and HDL of 29, which is increased from 24 in May 2017. He denies any new symptoms of chest pain or worsening shortness of breath. He does have a early peaking systolic murmur suggestive of mild aortic stenosis. This was last seen on echo in 2015. He is also describing pain in the left flank which radiates into the left groin. He was treated for possible UTI/prostatitis and had improvement in some urinary symptoms but has persistent pain. A CT scan was performed recently, which was essentially unremarkable. He reports his pain is worse when rising from either sitting or lying position up to standing and with walking. This could suggest some compression perhaps of the thoracic or lumbar spine or even symptoms suggestive of a possible hernia.   08/18/2016  Daryl Webb is seen today in follow-up. Overall he seems to be doing pretty well. He had an echocardiogram in January 2018 which now shows moderate aortic stenosis. This is worsened since his prior study in 2015. He has type 2 diabetes and A1c  is 6.7. Blood pressure is well-controlled today 122/60. Recent lipid profile showed an LDL-C of 58, which is at goal. He's asymptomatic, denies any chest pain or worsening shortness of breath. He does do some walking although says he can exercise more frequently.  02/23/2017  Daryl Webb was seen today in follow-up.  He had a recent echocardiogram which showed mild worsening of his aortic stenosis.  His mean gradient went from 22-24 mmHg.  This is still moderate left ear.  He has generally good blood pressure control.  Initially blood pressure is elevated today 142/86 however came down to 124/78.  His diabetes has been fairly well controlled.  He is followed by Dr. Sharl Ma for this.  Recent lipid profile showed an LDL-C of 58 on high intensity atorvastatin.  10/10/2018  Daryl Webb is seen today in follow-up.  Overall he is doing well.  He had a repeat echo in January of this year which showed slight worsening of his aortic stenosis.  His mean aortic gradient is increased from 24 to 30 mmHg putting it in the moderate to severe stenosis area.  He denies any chest pain or worsening shortness of breath.  He has had no syncopal events.  Diabetes is well controlled and he is followed by Dr. Sharl Ma.  His blood pressure is at goal today.  His lipids have been well managed.  His EKG shows sinus rhythm with some PACs today at 87.  04/28/2019  Daryl Webb returns for follow-up.  He recently had a repeat echo which I personally reviewed.  This shows a stable aortic valve gradient  around more consistent with moderate aortic stenosis.  A new finding, however was some inferior hypokinesis with a low normal EF 50 to 55%.  He denies any anginal symptoms.  This is reduced from previous EF of 65%.  EKG today shows sinus rhythm with some PACs however there are inferior and lateral T wave changes possibly suggestive of ischemia.  He says he has been less active over the past year but really denies any anginal  symptoms.  05/02/2020  Daryl Webb returns today for follow-up of his echo to assess aortic stenosis.  Been just over a year since his last study.  This echo however shows some significant progression of his aortic stenosis.  LVEF has dropped slightly to 50 to 55%.  It is now suggested that he has severe aortic stenosis with a mean gradient of 34 mmHg and valve area of 0.99.  There is grade 2 diastolic dysfunction.  His dimensionless index is 0.26.  I discussed this with him and his wife who was joining [place] via telephone conference during the office visit today.  She notes that he has been more short of breath but has not had recent chest pain.  He seems to downplay it somewhat.  He says he has been less active.  04/01/2021  Daryl Webb returns today for follow-up.  He underwent TAVR in July 2022.  Since then he is done extremely well.  He had his last echo in August 2022 which showed low valve gradient with no perivalvular leak and normal LV function.  Since then he has done well without a worsening shortness of breath or chest pain.  Blood pressure is well controlled today.  Weight is near normal.  EKG is unchanged.  12/08/2022  Daryl Webb is seen today in follow-up.  Overall he says he is feeling well.  He denies any chest pain or shortness of breath.  He did have a follow-up echo in February to reassess gradients across his aortic valve replacement.  His LVEF was normal and gradients across the valve were normal without any perivalvular leak.  Blood pressure appears to be well-controlled.  He had lipids in April of this year which showed total cholesterol of 85 with an LDL of 33.  Hemoglobin A1c was 6.4%.  PMHx:  Past Medical History:  Diagnosis Date   Acute blood loss anemia 06/27/2012   Anemia    Arthritis    CKD (chronic kidney disease)    Constipation due to pain medication    needs stool softner while on pain medication   Diabetes mellitus    Duodenal ulcer 08/13/2015   2014   - Dr Kaplan/GI   Duodenal ulcer with hemorrhage 08/13/2015   2014  - Dr Kaplan/GI   Dyspnea 06/19/2012   GERD (gastroesophageal reflux disease)    H/O: osteoarthritis    Heart murmur    History of kidney stones    History of prostate cancer    History of urethral stricture    Hypercholesterolemia    Hypertension    Melena 06/19/2012   PONV (postoperative nausea and vomiting)    Prostate cancer (HCC)    prostate   S/P TAVR (transcatheter aortic valve replacement) 08/12/2020   s/p TAVR with a 29 mm Edwards Sapien 3 THV via the TF approach by Dr. Excell Seltzer and Dr. Laneta Simmers   Severe aortic stenosis    Spinal stenosis    Ulcer duodenal hemorrhage     Past Surgical History:  Procedure Laterality Date  ARTHROPLASTY  2004   right knee Dr. Candie Chroman SURGERY     BIOPSY  11/21/2021   Procedure: BIOPSY;  Surgeon: Sherrilyn Rist, MD;  Location: [facility];  Service: Gastroenterology;;   CARDIAC CATHETERIZATION     CARPAL TUNNEL RELEASE     right hand,wrist Dr. Teressa Senter   CYSTOSCOPY N/A 09/20/2012   Procedure: CYSTOSCOPY;  Surgeon: Crecencio Mc, MD;  Location: [facility];  Service: Urology;  Laterality: N/A;   ESOPHAGOGASTRODUODENOSCOPY N/A 11/21/2021   Procedure: ESOPHAGOGASTRODUODENOSCOPY (EGD);  Surgeon: Sherrilyn Rist, MD;  Location: [facility];  Service: Gastroenterology;  Laterality: N/A;   ESOPHAGOGASTRODUODENOSCOPY (EGD) WITH PROPOFOL N/A 11/30/2021   Procedure: ESOPHAGOGASTRODUODENOSCOPY (EGD) WITH PROPOFOL;  Surgeon: Beverley Fiedler, MD;  Location: [facility];  Service: Gastroenterology;  Laterality: N/A;   HEMOSTASIS CLIP PLACEMENT  11/30/2021   Procedure: HEMOSTASIS CLIP PLACEMENT;  Surgeon: Beverley Fiedler, MD;  Location: [facility];  Service: Gastroenterology;;   INGUINAL HERNIA REPAIR Right 01/14/2022   Procedure: RIGHT OPEN INGUINAL HERNIA REPAIR WITH MESH;  Surgeon: Kinsinger, De Blanch, MD;  Location: [facility];  Service: General;  Laterality: Right;   JOINT  REPLACEMENT Right    KNEE ARTHROSCOPY Right    LUMBAR LAMINECTOMY  02/08/2013   PROSTATECTOMY     radical   RIGHT/LEFT HEART CATH AND CORONARY ANGIOGRAPHY N/A 05/08/2020   Procedure: RIGHT/LEFT HEART CATH AND CORONARY ANGIOGRAPHY;  Surgeon: Tonny Bollman, MD;  Location: [facility] INVASIVE CV LAB;  Service: Cardiovascular;  Laterality: N/A;   TEE WITHOUT CARDIOVERSION N/A 08/12/2020   Procedure: TRANSESOPHAGEAL ECHOCARDIOGRAM (TEE);  Surgeon: Tonny Bollman, MD;  Location: [facility] INVASIVE CV LAB;  Service: Open Heart Surgery;  Laterality: N/A;   TOOTH EXTRACTION  12/2021   TRANSCATHETER AORTIC VALVE REPLACEMENT, TRANSFEMORAL N/A 08/12/2020   Procedure: TRANSCATHETER AORTIC VALVE REPLACEMENT, TRANSFEMORAL;  Surgeon: Tonny Bollman, MD;  Location: [facility] INVASIVE CV LAB;  Service: Open Heart Surgery;  Laterality: N/A;   VASECTOMY  1985    FAMHx:  Family History  Problem Relation Age of Onset   Heart disease Mother    Heart failure Mother    Heart disease Father    Diabetes Father    Colon cancer Neg Hx    Esophageal cancer Neg Hx    Inflammatory bowel disease Neg Hx    Liver disease Neg Hx    Pancreatic cancer Neg Hx    Rectal cancer Neg Hx    Stomach cancer Neg Hx     SOCHx:   reports that he quit smoking about 42 years ago. His smoking use included cigarettes. He started smoking about 62 years ago. He has a 30 pack-year smoking history. He has never used smokeless tobacco. He reports that he does not drink alcohol and does not use drugs.  ALLERGIES:  Allergies  Allergen Reactions   Aspirin Other (See Comments)    Bleeding ulcer   Diclofenac Other (See Comments)    Bleeding ulcer   Excedrin Tension Headache [Acetaminophen-Caffeine] Other (See Comments)    Bleeding ulcer   Motrin [Ibuprofen] Other (See Comments)    Bleeding ulcer    ROS: Pertinent items noted in HPI and remainder of comprehensive ROS otherwise negative.  HOME MEDS: Current Outpatient Medications  Medication Sig  Dispense Refill   amLODipine (NORVASC) 2.5 MG tablet TAKE 1 TABLET BY MOUTH EVERY DAY 90 tablet 2   atorvastatin (LIPITOR) 40 MG tablet TAKE 1 TABLET DAILY 90 tablet 0   BD PEN NEEDLE NANO  2ND GEN 32G X 4 MM MISC daily.     calcium carbonate (TUMS - DOSED IN MG ELEMENTAL CALCIUM) 500 MG chewable tablet Chew 2 tablets by mouth daily as needed for indigestion or heartburn.     Cholecalciferol (VITAMIN D3) 1000 units CAPS Take 2,000 Units by mouth daily. 1 capsule 0   Coenzyme Q10 (COQ10) 100 MG CAPS Take 100 mg by mouth daily.     Continuous Glucose Receiver (DEXCOM G7 RECEIVER) DEVI use to monitor blood sugar for 365 days     Continuous Glucose Sensor (DEXCOM G7 SENSOR) MISC Use to check sugar continuously.Dx:E11.9 3 each 3   donepezil (ARICEPT) 10 MG tablet Take 1 tablet (10 mg total) by mouth at bedtime. 90 tablet 3   doxepin (SINEQUAN) 10 MG capsule Take 10 mg by mouth at bedtime.     FARXIGA 10 MG TABS tablet Take 10 mg by mouth in the morning.     ferrous gluconate (FERGON) 324 MG tablet Take 324 mg by mouth every other day.     Insulin Glargine (BASAGLAR KWIKPEN) 100 UNIT/ML 10 units (titrate as directed up to 50 units per day) Subcutaneous once a day for 30 days     losartan-hydrochlorothiazide (HYZAAR) 100-25 MG tablet Take 1 tablet by mouth daily.     Melatonin 12 MG TABS Take 5 mg by mouth at bedtime.     metoprolol succinate (TOPROL-XL) 50 MG 24 hr tablet TAKE 1 TABLET DAILY, WITH  OR IMMEDIATELY FOLLOWING A MEAL 90 tablet 3   NON FORMULARY Take 2 capsules by mouth See admin instructions. Vicks ZzzQuil Pure Zzzs Triple Action Gummies- Chew 2 gummies by mouth every night at bedtime     OZEMPIC, 0.25 OR 0.5 MG/DOSE, 2 MG/3ML SOPN Inject into the skin once a week.     pantoprazole (PROTONIX) 20 MG tablet Take 20 mg by mouth daily.     TYLENOL 500 MG tablet Take 1,000 mg by mouth every 6 (six) hours as needed for headache.     No current facility-administered medications for this visit.     LABS/IMAGING: No results found for this or any previous visit (from the past 48 hour(s)). No results found.  WEIGHTS: Wt Readings from Last 3 Encounters:  12/07/22 173 lb 6.4 oz (78.7 kg)  09/28/22 172 lb (78 kg)  08/09/22 171 lb 6 oz (77.7 kg)    VITALS: BP 114/68 (BP Location: Right Arm, Patient Position: Sitting, Cuff Size: Normal)   Pulse 69   Ht 5\' 7"  (1.702 m)   Wt 173 lb 6.4 oz (78.7 kg)   SpO2 95%   BMI 27.16 kg/m   EXAM: General appearance: alert and no distress Neck: no carotid bruit, no JVD and thyroid not enlarged, symmetric, no tenderness/mass/nodules Lungs: clear to auscultation bilaterally Heart: regular rate and rhythm, S1, S2 normal and systolic murmur: Soft 2 out of 6 systolic murmur at the right upper sternal border Abdomen: soft, non-tender; bowel sounds normal; no masses,  no organomegaly Extremities: extremities normal, atraumatic, no cyanosis or edema Pulses: 2+ and symmetric Skin: Skin color, texture, turgor normal. No rashes or lesions Neurologic: Grossly normal Psych: Pleasant  EKG: EKG Interpretation Date/Time:  Tuesday December 07 2022 15:52:49 EDT Ventricular Rate:  69 PR Interval:  180 QRS Duration:  112 QT Interval:  426 QTC Calculation: 456 R Axis:   -37  Text Interpretation: Normal sinus rhythm Left axis deviation Cannot rule out Anterior infarct , age undetermined T wave abnormality, consider lateral ischemia  When compared with ECG of 29-Nov-2021 10:19, Premature ventricular complexes are no longer Present Premature supraventricular complexes are no longer Present QRS axis Shifted left Nonspecific T wave abnormality has replaced inverted T waves in Inferior leads Nonspecific T wave abnormality now evident in Anterior leads Confirmed by Zoila Shutter [phone]) on 12/07/2022 4:32:33 PM    ASSESSMENT: Low normal EF 50 to 55%, inferior hypokinesis, ischemic EKG changes Hypertension Dyslipidemia-at goal LDL<70 Diabetes type 2-A1c 6.4,  followed by Dr. Sharl Ma Severe symptomatic aortic stenosis - mean gradient 34 mmHg, AVA 0.99 cm2 (03/2020) -status post TAVR with 29 mm Edwards SAPIEN 3 valve (08/12/2020) DOE  PLAN: 1.   Daryl Webb has done very well after TAVR in 2022.  Recent echo this year shows continued normal LV function without any perivalvular leak.  Cholesterols been well treated.  Blood pressure is at goal.  His A1c is now down to 6.4%.  Overall he seems to be doing very well.  Follow-up with me annually or sooner as necessary  Chrystie Nose, MD, [facility], FACP  [place]  [facility]  Medical Director of the Advanced Lipid Disorders &  Cardiovascular Risk Reduction Clinic Diplomate of the American Board of Clinical Lipidology Attending Cardiologist  Direct Dial: [phone]  Fax: [phone]  Website:  [email or web address] Nicely Kaelyn Innocent 12/07/2022, 4:32 PM

## 2022-12-07 NOTE — Patient Instructions (Signed)
Medication Instructions:  No changes  *If you need a refill on your cardiac medications before your next appointment, please call your pharmacy*   Lab Work: Not needed    Testing/Procedures: Not needed   Follow-Up: At [facility], you and your health needs are our priority.  As part of our continuing mission to provide you with exceptional heart care, we have created designated Provider Care Teams.  These Care Teams include your primary Cardiologist (physician) and Advanced Practice Providers (APPs -  Physician Assistants and Nurse Practitioners) who all work together to provide you with the care you need, when you need it.     Your next appointment:   12 month(s)  The format for your next appointment:   In Person  Provider:   Chrystie Nose, MD

## 2022-12-10 DIAGNOSIS — N1832 Chronic kidney disease, stage 3b: Secondary | ICD-10-CM | POA: Diagnosis not present

## 2022-12-11 ENCOUNTER — Other Ambulatory Visit (HOSPITAL_BASED_OUTPATIENT_CLINIC_OR_DEPARTMENT_OTHER): Payer: Self-pay | Admitting: Internal Medicine

## 2022-12-13 DIAGNOSIS — Z23 Encounter for immunization: Secondary | ICD-10-CM | POA: Diagnosis not present

## 2022-12-14 ENCOUNTER — Other Ambulatory Visit: Payer: Self-pay | Admitting: Internal Medicine

## 2022-12-15 ENCOUNTER — Telehealth: Payer: Self-pay | Admitting: Internal Medicine

## 2022-12-15 DIAGNOSIS — N1832 Chronic kidney disease, stage 3b: Secondary | ICD-10-CM | POA: Diagnosis not present

## 2022-12-15 DIAGNOSIS — I129 Hypertensive chronic kidney disease with stage 1 through stage 4 chronic kidney disease, or unspecified chronic kidney disease: Secondary | ICD-10-CM | POA: Diagnosis not present

## 2022-12-15 DIAGNOSIS — E1122 Type 2 diabetes mellitus with diabetic chronic kidney disease: Secondary | ICD-10-CM | POA: Diagnosis not present

## 2022-12-15 DIAGNOSIS — Z87448 Personal history of other diseases of urinary system: Secondary | ICD-10-CM | POA: Diagnosis not present

## 2022-12-15 MED ORDER — ATORVASTATIN CALCIUM 40 MG PO TABS: 40.0000 mg | ORAL_TABLET | Freq: Every day | ORAL | 3 refills | Status: DC

## 2022-12-15 NOTE — Telephone Encounter (Signed)
*  STAT* If patient is at the pharmacy, call can be transferred to refill team.   1. Which medications need to be refilled? (please list name of each medication and dose if known) atorvastatin (LIPITOR) 40 MG tablet    2. Would you like to learn more about the convenience, safety, & potential cost savings by using the [facility]? No   3. Are you open to using the [facility] (Type [facility]. ) No   4. Which pharmacy/location (including street and city if local pharmacy) is medication to be sent to? [id number] [address] [address], [place] [address] Phone: [phone]  Fax: [phone]     5. Do they need a 30 day or 90 day supply? 5 tablets  Pt's wife requesting 5 tablets to be set to above pharmacy being that pt has only 1 tablet left and mail order will not arrive until 11/11

## 2022-12-28 ENCOUNTER — Encounter: Payer: Medicare Other | Admitting: Family Medicine

## 2023-01-15 ENCOUNTER — Other Ambulatory Visit: Payer: Self-pay | Admitting: Family Medicine

## 2023-01-25 ENCOUNTER — Telehealth: Payer: Self-pay | Admitting: Neurology

## 2023-01-25 NOTE — Telephone Encounter (Signed)
Agree to follow up with PCP regarding his sleepiness and Doxepin.

## 2023-01-25 NOTE — Telephone Encounter (Signed)
At 12:17 wife left a vm asking to be called to discuss her concern for pt sleeping entirely too much, she said it has become very hard to get him to get up and do anything, she is asking for a call to discuss this concern.

## 2023-01-25 NOTE — Telephone Encounter (Signed)
Returned call to wife who reports increased sleeping (10-12 hours) at night but only a few solid good hours sleep. She states no recent infections and denies UTI symptoms and confusion. She states Dr. [place] PCP has prescribed doxepin a few months and thinks the trouble started then but patient didn't like the trazodone. I advised patient is would send to Dr. Teresa Coombs to review but she should reach out to PCP since she prescribed the sleep medication. We also discussed alzheimer disease process. Patient in agreement to follow up with PCP and reach back out is worsening confusion or agitation.

## 2023-02-03 ENCOUNTER — Ambulatory Visit: Payer: Medicare Other | Admitting: Family Medicine

## 2023-02-03 DIAGNOSIS — Z0000 Encounter for general adult medical examination without abnormal findings: Secondary | ICD-10-CM | POA: Diagnosis not present

## 2023-02-03 NOTE — Progress Notes (Signed)
PATIENT CHECK-IN and HEALTH RISK ASSESSMENT QUESTIONNAIRE:  -completed by phone/video for upcoming Medicare Preventive Visit   Pre-Visit Check-in: 1)Vitals (height, wt, BP, etc) - record in vitals section for visit on day of visit Request home vitals (wt, BP, etc.) and enter into vitals, THEN update Vital Signs SmartPhrase below at the top of the HPI. See below.  2)Review and Update Medications, Allergies PMH, Surgeries, Social history in Epic 3)Hospitalizations in the last year with date/reason? About a year ago had a hernia surgery  4)Review and Update Care Team (patient's specialists) in Epic 5) Complete PHQ9 in Epic  6) Complete Fall Screening in Epic 7)Review all Health Maintenance Due and order under PCP if not done.  Medicare Wellness Patient Questionnaire:  Answer theses question about your habits: How often do you have a drink containing alcohol? no How many drinks containing alcohol do you have on a typical day when you are drinking? N/a How often do you have six or more drinks on one occasion?n/a Have you ever smoked? Quit over 40 years ago Do you use an illicit drugs?n/a On average, how many days per week do you engage in moderate to strenuous exercise (like a brisk walk)? None right now. But does walk to eat.  Diet is per dining at facility   Beverages:  water   Answer theses question about your everyday activities: Can you perform most household chores? yes Are you deaf or have significant trouble hearing?no - hearing aids Do you feel that you have a problem with memory? Diagnosed with dementia earlier this year - seeing Dr. Levert Feinstein Do you feel safe at home? yes Last dentist visit? Yes - about 3 months ago 8. Do you have any difficulty performing your everyday activities?no Are you having any difficulty walking, taking medications on your own, and or difficulty managing daily home needs?no Do you have difficulty walking or climbing stairs? no Do you have difficulty  dressing or bathing?no Do you have difficulty doing errands alone such as visiting a doctor's office or shopping?np Do you currently have any difficulty preparing food and eating?mo Do you currently have any difficulty using the toilet?no Do you have any difficulty managing your finances?no Do you have any difficulties with housekeeping of managing your housekeeping?no   Do you have Advanced Directives in place (Living Will, Healthcare Power or Attorney)?  yes   Last eye Exam and location? Goes once per year - [facility] [place] - Dr. Charlotte Sanes   Do you currently use prescribed or non-prescribed narcotic or opioid pain medications?n0      ----------------------------------------------------------------------------------------------------------------------------------------------------------------------------------------------------------------------  Because this visit was a virtual/telehealth visit, some criteria may be missing or patient reported. Any vitals not documented were not able to be obtained and vitals that have been documented are patient reported.    MEDICARE ANNUAL PREVENTIVE CARE VISIT WITH PROVIDER (Welcome to Medicare, initial annual wellness or annual wellness exam)  Virtual Visit via Video Note  I connected with Daryl Webb on 02/03/23  by a video enabled telemedicine application and verified that I am speaking with the correct person using two identifiers.  Location patient: home Location provider:work or home office Persons participating in the virtual visit: patient, provider, patient's wife  Concerns and/or follow up today: doing well. Has seen Dr. Swaziland for sleep and has tried several medications but does not seem to help much. Has found that melatonin seems to help some... but not much. Has trouble falling asleep initially - but once falls asleep  does ok.    See HM section in Epic for other details of completed HM.    ROS: negative  for report of fevers, unintentional weight loss, vision changes, vision loss, hearing loss or change, chest pain, sob, hemoptysis, melena, hematochezia, hematuria, falls, bleeding or bruising, thoughts of suicide or self harm, memory loss  Patient-completed extensive health risk assessment - reviewed and discussed with the patient: See Health Risk Assessment completed with patient prior to the visit either above or in recent phone note. This was reviewed in detailed with the patient today and appropriate recommendations, orders and referrals were placed as needed per Summary below and patient instructions.   Review of Medical History: -PMH, PSH, Family History and current specialty and care providers reviewed and updated and listed below   Patient Care Team: Swaziland, [name], MD as PCP - General (Family Medicine) Rennis Golden Lisette Abu, MD as PCP - Cardiology (Cardiology) Talmage Coin, MD as Consulting Physician (Endocrinology) Jethro Bolus, MD as Consulting Physician (Ophthalmology) Sherrill Raring, [facility] (Pharmacist)   Past Medical History:  Diagnosis Date   Acute blood loss anemia 06/27/2012   Anemia    Arthritis    CKD (chronic kidney disease)    Constipation due to pain medication    needs stool softner while on pain medication   Diabetes mellitus    Duodenal ulcer 08/13/2015   2014  - Dr Kaplan/GI   Duodenal ulcer with hemorrhage 08/13/2015   2014  - Dr Kaplan/GI   Dyspnea 06/19/2012   GERD (gastroesophageal reflux disease)    H/O: osteoarthritis    Heart murmur    History of kidney stones    History of prostate cancer    History of urethral stricture    Hypercholesterolemia    Hypertension    Melena 06/19/2012   PONV (postoperative nausea and vomiting)    Prostate cancer (HCC)    prostate   S/P TAVR (transcatheter aortic valve replacement) 08/12/2020   s/p TAVR with a 29 mm Edwards Sapien 3 THV via the TF approach by Dr. Excell Seltzer and Dr. Laneta Simmers   Severe aortic stenosis     Spinal stenosis    Ulcer duodenal hemorrhage     Past Surgical History:  Procedure Laterality Date   ARTHROPLASTY  2004   right knee Dr. Candie Chroman SURGERY     BIOPSY  11/21/2021   Procedure: BIOPSY;  Surgeon: Sherrilyn Rist, MD;  Location: [facility];  Service: Gastroenterology;;   CARDIAC CATHETERIZATION     CARPAL TUNNEL RELEASE     right hand,wrist Dr. Teressa Senter   CYSTOSCOPY N/A 09/20/2012   Procedure: Bluford Kaufmann;  Surgeon: Crecencio Mc, MD;  Location: [facility];  Service: Urology;  Laterality: N/A;   ESOPHAGOGASTRODUODENOSCOPY N/A 11/21/2021   Procedure: ESOPHAGOGASTRODUODENOSCOPY (EGD);  Surgeon: Sherrilyn Rist, MD;  Location: [facility];  Service: Gastroenterology;  Laterality: N/A;   ESOPHAGOGASTRODUODENOSCOPY (EGD) WITH PROPOFOL N/A 11/30/2021   Procedure: ESOPHAGOGASTRODUODENOSCOPY (EGD) WITH PROPOFOL;  Surgeon: Beverley Fiedler, MD;  Location: [facility];  Service: Gastroenterology;  Laterality: N/A;   HEMOSTASIS CLIP PLACEMENT  11/30/2021   Procedure: HEMOSTASIS CLIP PLACEMENT;  Surgeon: Beverley Fiedler, MD;  Location: [facility];  Service: Gastroenterology;;   INGUINAL HERNIA REPAIR Right 01/14/2022   Procedure: RIGHT OPEN INGUINAL HERNIA REPAIR WITH MESH;  Surgeon: Kinsinger, De Blanch, MD;  Location: [facility];  Service: General;  Laterality: Right;   JOINT REPLACEMENT Right    KNEE ARTHROSCOPY Right  LUMBAR LAMINECTOMY  02/08/2013   PROSTATECTOMY     radical   RIGHT/LEFT HEART CATH AND CORONARY ANGIOGRAPHY N/A 05/08/2020   Procedure: RIGHT/LEFT HEART CATH AND CORONARY ANGIOGRAPHY;  Surgeon: Tonny Bollman, MD;  Location: [facility] INVASIVE CV LAB;  Service: Cardiovascular;  Laterality: N/A;   TEE WITHOUT CARDIOVERSION N/A 08/12/2020   Procedure: TRANSESOPHAGEAL ECHOCARDIOGRAM (TEE);  Surgeon: Tonny Bollman, MD;  Location: [facility] INVASIVE CV LAB;  Service: Open Heart Surgery;  Laterality: N/A;   TOOTH EXTRACTION  12/2021   TRANSCATHETER AORTIC VALVE REPLACEMENT,  TRANSFEMORAL N/A 08/12/2020   Procedure: TRANSCATHETER AORTIC VALVE REPLACEMENT, TRANSFEMORAL;  Surgeon: Tonny Bollman, MD;  Location: [facility] INVASIVE CV LAB;  Service: Open Heart Surgery;  Laterality: N/A;   VASECTOMY  1985    Social History   Socioeconomic History   Marital status: Married    Spouse name: Not on file   Number of children: 2   Years of education: 16   Highest education level: Bachelor's degree (e.g., BA, AB, BS)  Occupational History   Occupation: Retired  Tobacco Use   Smoking status: Former    Current packs/day: 0.00    Average packs/day: 1.5 packs/day for 20.0 years (30.0 ttl pk-yrs)    Types: Cigarettes    Start date: 02/09/1960    Quit date: 02/09/1980    Years since quitting: 43.0   Smokeless tobacco: Never  Vaping Use   Vaping status: Never Used  Substance and Sexual Activity   Alcohol use: No   Drug use: No   Sexual activity: Not Currently  Other Topics Concern   Not on file  Social History Narrative   Retired from Engineer, structural (boats).    Lives with wife,    two children in [place].    Enjoys reading, boating, travelling to [place] in the winter with wife.   Social Drivers of Corporate investment banker Strain: Low Risk  (12/24/2021)   Overall Financial Resource Strain (CARDIA)    Difficulty of Paying Living Expenses: Not hard at all  Food Insecurity: No Food Insecurity (04/21/2022)   Hunger Vital Sign    Worried About Running Out of Food in the Last Year: Never true    Ran Out of Food in the Last Year: Never true  Transportation Needs: No Transportation Needs (04/21/2022)   PRAPARE - Administrator, Civil Service (Medical): No    Lack of Transportation (Non-Medical): No  Physical Activity: Inactive (12/24/2021)   Exercise Vital Sign    Days of Exercise per Week: 0 days    Minutes of Exercise per Session: 0 min  Stress: No Stress Concern Present (12/24/2021)   [employer] of Occupational Health - Occupational Stress  Questionnaire    Feeling of Stress : Not at all  Social Connections: Moderately Isolated (12/24/2021)   Social Connection and Isolation Panel [NHANES]    Frequency of Communication with Friends and Family: More than three times a week    Frequency of Social Gatherings with Friends and Family: More than three times a week    Attends Religious Services: Never    Database administrator or Organizations: No    Attends Banker Meetings: Never    Marital Status: Married  Catering manager Violence: Not At Risk (12/24/2021)   Humiliation, Afraid, Rape, and Kick questionnaire    Fear of Current or Ex-Partner: No    Emotionally Abused: No    Physically Abused: No    Sexually Abused: No    Family  History  Problem Relation Age of Onset   Heart disease Mother    Heart failure Mother    Heart disease Father    Diabetes Father    Colon cancer Neg Hx    Esophageal cancer Neg Hx    Inflammatory bowel disease Neg Hx    Liver disease Neg Hx    Pancreatic cancer Neg Hx    Rectal cancer Neg Hx    Stomach cancer Neg Hx     Current Outpatient Medications on File Prior to Visit  Medication Sig Dispense Refill   amLODipine (NORVASC) 2.5 MG tablet TAKE 1 TABLET BY MOUTH EVERY DAY 90 tablet 2   atorvastatin (LIPITOR) 40 MG tablet Take 1 tablet (40 mg total) by mouth daily. 90 tablet 3   BD PEN NEEDLE NANO 2ND GEN 32G X 4 MM MISC daily.     calcium carbonate (TUMS - DOSED IN MG ELEMENTAL CALCIUM) 500 MG chewable tablet Chew 2 tablets by mouth daily as needed for indigestion or heartburn.     Cholecalciferol (VITAMIN D3) 1000 units CAPS Take 2,000 Units by mouth daily. 1 capsule 0   Coenzyme Q10 (COQ10) 100 MG CAPS Take 100 mg by mouth daily.     Continuous Glucose Receiver (DEXCOM G7 RECEIVER) DEVI use to monitor blood sugar for 365 days     Continuous Glucose Sensor (DEXCOM G7 SENSOR) MISC Use to check sugar continuously.Dx:E11.9 3 each 3   donepezil (ARICEPT) 10 MG tablet Take 1 tablet  (10 mg total) by mouth at bedtime. 90 tablet 3   doxepin (SINEQUAN) 10 MG capsule TAKE 1 CAPSULE BY MOUTH AT BEDTIME. 90 capsule 1   FARXIGA 10 MG TABS tablet Take 10 mg by mouth in the morning.     ferrous gluconate (FERGON) 324 MG tablet Take 324 mg by mouth every other day.     Insulin Glargine (BASAGLAR KWIKPEN) 100 UNIT/ML 10 units (titrate as directed up to 50 units per day) Subcutaneous once a day for 30 days     losartan-hydrochlorothiazide (HYZAAR) 100-25 MG tablet Take 1 tablet by mouth daily.     Melatonin 12 MG TABS Take 5 mg by mouth at bedtime.     metoprolol succinate (TOPROL-XL) 50 MG 24 hr tablet TAKE 1 TABLET DAILY, WITH  OR IMMEDIATELY FOLLOWING A MEAL 90 tablet 3   NON FORMULARY Take 2 capsules by mouth See admin instructions. Vicks ZzzQuil Pure Zzzs Triple Action Gummies- Chew 2 gummies by mouth every night at bedtime     OZEMPIC, 0.25 OR 0.5 MG/DOSE, 2 MG/3ML SOPN Inject into the skin once a week.     pantoprazole (PROTONIX) 20 MG tablet Take 20 mg by mouth daily.     TYLENOL 500 MG tablet Take 1,000 mg by mouth every 6 (six) hours as needed for headache.     No current facility-administered medications on file prior to visit.    Allergies  Allergen Reactions   Aspirin Other (See Comments)    Bleeding ulcer   Diclofenac Other (See Comments)    Bleeding ulcer   Excedrin Tension Headache [Acetaminophen-Caffeine] Other (See Comments)    Bleeding ulcer   Motrin [Ibuprofen] Other (See Comments)    Bleeding ulcer       Physical Exam Vitals requested from patient and listed below if patient had equipment and was able to obtain at home for this virtual visit: There were no vitals filed for this visit. Estimated body mass index is 27.16 kg/m as calculated from  the following:   Height as of 12/07/22: 5\' 7"  (1.702 m).   Weight as of 12/07/22: 173 lb 6.4 oz (78.7 kg).  EKG (optional): deferred due to virtual visit  GENERAL: alert, oriented, no acute distress  detected; full vision exam deferred due to pandemic and/or virtual encounter   HEENT: atraumatic, conjunttiva clear, no obvious abnormalities on inspection of external nose and ears  NECK: normal movements of the head and neck  LUNGS: on inspection no signs of respiratory distress, breathing rate appears normal, no obvious gross SOB, gasping or wheezing  CV: no obvious cyanosis  MS: moves all visible extremities without noticeable abnormality  PSYCH/NEURO: pleasant and cooperative, no obvious depression or anxiety, speech and thought processing grossly intact, Cognitive function grossly intact  Flowsheet Row Office Visit from 08/09/2022 in [facility]  PHQ-9 Total Score 6           02/03/2023    5:21 PM 08/09/2022   12:47 PM 05/31/2022    1:57 PM 02/09/2022    2:28 PM 12/24/2021   11:09 AM  Depression screen PHQ 2/9  Decreased Interest 0 0 0 0 0  Down, Depressed, Hopeless 0 0 0 0 0  PHQ - 2 Score 0 0 0 0 0  Altered sleeping  3   0  Tired, decreased energy  1   0  Change in appetite  0   0  Feeling bad or failure about yourself   0   0  Trouble concentrating  1   0  Moving slowly or fidgety/restless  1   0  Suicidal thoughts  0   0  PHQ-9 Score  6   0  Difficult doing work/chores  Not difficult at all   Not difficult at all       12/24/2021   11:11 AM 01/23/2022    9:04 AM 01/31/2022    1:03 PM 02/09/2022    2:28 PM 02/03/2023    5:20 PM  Fall Risk  Falls in the past year? 0   0 0  Was there an injury with Fall? 0   0 0  Fall Risk Category Calculator 0   0 0  Fall Risk Category (Retired) Low   Low   (RETIRED) Patient Fall Risk Level Low fall risk Low fall risk Moderate fall risk Moderate fall risk   Patient at Risk for Falls Due to No Fall Risks   History of fall(s)   Fall risk Follow up Falls prevention discussed   Falls evaluation completed      SUMMARY AND PLAN:  Encounter for Medicare annual wellness exam   Discussed  applicable health maintenance/preventive health measures and advised and referred or ordered per patient preferences:  Health Maintenance  Topic Date Due   Diabetic kidney evaluation - Urine ACR  11/18/2020   OPHTHALMOLOGY EXAM  01/05/2022   COVID-19 Vaccine (6 - 2024-25 season) 10/10/2022   Diabetic kidney evaluation - eGFR measurement  04/26/2023   HEMOGLOBIN A1C  06/03/2023   FOOT EXAM  12/03/2023   Medicare Annual Wellness (AWV)  02/03/2024   Pneumonia Vaccine 82+ Years old  Completed   INFLUENZA VACCINE  Completed   Hepatitis C Screening  Completed   Zoster Vaccines- Shingrix  Completed   HPV VACCINES  Aged Out   DTaP/Tdap/Td  Discontinued   Colonoscopy  Discontinued     Education and counseling on the following was provided based on the above review of health and a plan/checklist  for the patient, along with additional information discussed, was provided for the patient in the patient instructions :  -Provided counseling and plan for increased risk of falling if applicable per above screening. Reviewed and demonstrated safe balance exercises that can be done at home to improve balance and discussed exercise guidelines for adults with include balance exercises at least 3 days per week.  -Advised and counseled on a healthy lifestyle - including the importance of a healthy diet, regular physical activity, social connections and stress management. -provided icbt and counseling for sleep, discussed sleep and wake times, white light impact on circadian clock, resetting circadian clock, red light night lights, etc. Encouraged to get natural light in the mornings. Advise icbt and they are going to look into this.  -Reviewed patient's current diet. Advised and counseled on a whole foods based healthy diet. A summary of a healthy diet was provided in the Patient Instructions.  -reviewed patient's current physical activity level and discussed exercise guidelines for adults. Discussed community  resources and ideas for safe exercise at home to assist in meeting exercise guideline recommendations in a safe and healthy way. He has classes at the facility and plans to look into this. -Advise yearly dental visits at minimum and regular eye exams   Follow up: see patient instructions   Patient Instructions  I really enjoyed getting to talk with you today! I am available on Tuesdays and Thursdays for virtual visits if you have any questions or concerns, or if I can be of any further assistance.   CHECKLIST FROM ANNUAL WELLNESS VISIT:  -Follow up (please call to schedule if not scheduled after visit):   -yearly for annual wellness visit with primary care office  Here is a list of your preventive care/health maintenance measures and the plan for each if any are due:  PLAN For any measures below that may be due:  -please let us know when you get the covid shot -get the eye exam once per year  Health Maintenance  Topic Date Due   Diabetic kidney evaluation - Urine ACR  11/18/2020   OPHTHALMOLOGY EXAM  01/05/2022   COVID-19 Vaccine (6 - 2024-25 season) 10/10/2022   Diabetic kidney evaluation - eGFR measurement  04/26/2023   HEMOGLOBIN A1C  06/03/2023   FOOT EXAM  12/03/2023   Medicare Annual Wellness (AWV)  02/03/2024   Pneumonia Vaccine 2+ Years old  Completed   INFLUENZA VACCINE  Completed   Hepatitis C Screening  Completed   Zoster Vaccines- Shingrix  Completed   HPV VACCINES  Aged Out   DTaP/Tdap/Td  Discontinued   Colonoscopy  Discontinued    -See a dentist at least yearly  -Get your eyes checked and then per your eye specialist's recommendations  -Other issues addressed today:   -I have included below further information regarding a healthy whole foods based diet, physical activity guidelines for adults, stress management and opportunities for social connections. I hope you find this information useful.    -----------------------------------------------------------------------------------------------------------------------------------------------------------------------------------------------------------------------------------------------------------  NUTRITION: -eat real food: lots of colorful vegetables (half the plate) and fruits -5-7 servings of vegetables and fruits per day (fresh or steamed is best), exp. 2 servings of vegetables with lunch and dinner and 2 servings of fruit per day. Berries and greens such as kale and collards are great choices.  -consume on a regular basis: whole grains (make sure first ingredient on label contains the word "whole"), fresh fruits, fish, nuts, seeds, healthy oils (such as olive oil, avocado oil, grape  seed oil) -may eat small amounts of dairy and lean meat on occasion, but avoid processed meats such as ham, bacon, lunch meat, etc. -drink water -try to avoid fast food and pre-packaged foods, processed meat -most experts advise limiting sodium to < 2300mg  per day, should limit further is any chronic conditions such as high blood pressure, heart disease, diabetes, etc. The American Heart Association advised that < 1500mg  is is ideal -try to avoid foods that contain any ingredients with names you do not recognize  -try to avoid sugar/sweets (except for the natural sugar that occurs in fresh fruit) -try to avoid sweet drinks -try to avoid white rice, white bread, pasta (unless whole grain), white or yellow potatoes  EXERCISE GUIDELINES FOR ADULTS: -if you wish to increase your physical activity, do so gradually and with the approval of your doctor -STOP and seek medical care immediately if you have any chest pain, chest discomfort or trouble breathing when starting or increasing exercise  -move and stretch your body, legs, feet and arms when sitting for long periods -Physical activity guidelines for optimal health in adults: -least 150 minutes per week of  aerobic exercise (can talk, but not sing) once approved by your doctor, 20-30 minutes of sustained activity or two 10 minute episodes of sustained activity every day.  -resistance training at least 2 days per week if approved by your doctor -balance exercises 3+ days per week:   Stand somewhere where you have something sturdy to hold onto if you lose balance.    1) lift up on toes, start with 5x per day and work up to 20x   2) stand and lift on leg straight out to the side so that foot is a few inches of the floor, start with 5x each side and work up to 20x each side   3) stand on one foot, start with 5 seconds each side and work up to 20 seconds on each side  If you need ideas or help with getting more active:  -Silver sneakers https://tools.silversneakers.com  -Walk with a Doc: http://www.duncan-williams.com/  -try to include resistance (weight lifting/strength building) and balance exercises twice per week: or the following link for ideas: http://castillo-powell.com/  BuyDucts.dk  STRESS MANAGEMENT: -can try meditating, or just sitting quietly with deep breathing while intentionally relaxing all parts of your body for 5 minutes daily -if you need further help with stress, anxiety or depression please follow up with your primary doctor or contact the wonderful folks at [facility]: [phone]  SOCIAL CONNECTIONS: -options in [place] if you wish to engage in more social and exercise related activities:  -Silver sneakers https://tools.silversneakers.com  -Walk with a Doc: http://www.duncan-williams.com/  -Check out the [facility] Active Adults 50+ section on the [place] [employer] (hiking clubs, book clubs, cards and games, chess, exercise classes, aquatic classes and much more) - see the website for  details: https://www.[place].gov/departments/parks-recreation/active-adults50  -YouTube has lots of exercise videos for different ages and abilities as well  -[facility] (a variety of indoor and outdoor inperson activities for adults). [phone]. [address].  -Virtual Online Classes (a variety of topics): see seniorplanet.org or call [phone]  -consider volunteering at a school, hospice center, church, senior center or elsewhere    FOR IMPROVED SLEEP AND TO RESET YOUR SLEEP SCHEDULE:  []  Schedule sleep counseling(cognitive behavioral therapy).   [facility] is a good option.   Call for appointment: [phone]  []  Exercise 30 minutes daily. Some people do better  with exercise in the morning, other do better exercising later in the day.  []  Avoid caffeine and alcohol - particularly in the evenings. For some people even a little alcohol can impair sleep.   []  Go to bed and wake up at the same time everyday (within a 30 minute window). When you get up in the morning for your designated wake time - turn on lights and open curtains right away. This will help to set your internal sleep clock.   []  Keep bedroom cool, dark and quiet - if you have to get up at night make sure there is no white light from street lamps, night lights, lights, clock, devices etc. that enters your eyes. Instead, use red light flashlight or night lights and avoid looking directly at the light while up.   []  Set 1-2 hour bedtime routine, dim lights, avoid screens (phones, computers, TVs, etc during this time), consider sleepytime tea, warm bath or shower, avoid alcohol and caffeine  []  Reserve bed for sleep - do not read, watch TV, look at phone or device, etc., in bed.  []  If you toss and turn for more then 10-15 minutes, get out of bed and list or journal thoughts or do quiet activity (not screen-time, no tv, phone, computer) then go back to bed. Repeat as needed.  Try not to worry about when you will eventually fall asleep.   []  Seek help for any depression or anxiety.  [] Prescription strength sleep medications should only be used in severe cases of insomnia if other measures fail and should be used sparingly.  I hope you are feeling better soon! Follow up with your doctor in 3-4 weeks or sooner if your symptoms worsen or new concerns arise.        Terressa Koyanagi, DO

## 2023-02-03 NOTE — Patient Instructions (Addendum)
I really enjoyed getting to talk with you today! I am available on Tuesdays and Thursdays for virtual visits if you have any questions or concerns, or if I can be of any further assistance.   CHECKLIST FROM ANNUAL WELLNESS VISIT:  -Follow up (please call to schedule if not scheduled after visit):   -yearly for annual wellness visit with primary care office  Here is a list of your preventive care/health maintenance measures and the plan for each if any are due:  PLAN For any measures below that may be due:  -please let us know when you get the covid shot -get the eye exam once per year  Health Maintenance  Topic Date Due   Diabetic kidney evaluation - Urine ACR  11/18/2020   OPHTHALMOLOGY EXAM  01/05/2022   COVID-19 Vaccine (6 - 2024-25 season) 10/10/2022   Diabetic kidney evaluation - eGFR measurement  04/26/2023   HEMOGLOBIN A1C  06/03/2023   FOOT EXAM  12/03/2023   Medicare Annual Wellness (AWV)  02/03/2024   Pneumonia Vaccine 38+ Years old  Completed   INFLUENZA VACCINE  Completed   Hepatitis C Screening  Completed   Zoster Vaccines- Shingrix  Completed   HPV VACCINES  Aged Out   DTaP/Tdap/Td  Discontinued   Colonoscopy  Discontinued    -See a dentist at least yearly  -Get your eyes checked and then per your eye specialist's recommendations  -Other issues addressed today:   -I have included below further information regarding a healthy whole foods based diet, physical activity guidelines for adults, stress management and opportunities for social connections. I hope you find this information useful.   -----------------------------------------------------------------------------------------------------------------------------------------------------------------------------------------------------------------------------------------------------------  NUTRITION: -eat real food: lots of colorful vegetables (half the plate) and fruits -5-7 servings of vegetables and fruits  per day (fresh or steamed is best), exp. 2 servings of vegetables with lunch and dinner and 2 servings of fruit per day. Berries and greens such as kale and collards are great choices.  -consume on a regular basis: whole grains (make sure first ingredient on label contains the word "whole"), fresh fruits, fish, nuts, seeds, healthy oils (such as olive oil, avocado oil, grape seed oil) -may eat small amounts of dairy and lean meat on occasion, but avoid processed meats such as ham, bacon, lunch meat, etc. -drink water -try to avoid fast food and pre-packaged foods, processed meat -most experts advise limiting sodium to < 2300mg  per day, should limit further is any chronic conditions such as high blood pressure, heart disease, diabetes, etc. The American Heart Association advised that < 1500mg  is is ideal -try to avoid foods that contain any ingredients with names you do not recognize  -try to avoid sugar/sweets (except for the natural sugar that occurs in fresh fruit) -try to avoid sweet drinks -try to avoid white rice, white bread, pasta (unless whole grain), white or yellow potatoes  EXERCISE GUIDELINES FOR ADULTS: -if you wish to increase your physical activity, do so gradually and with the approval of your doctor -STOP and seek medical care immediately if you have any chest pain, chest discomfort or trouble breathing when starting or increasing exercise  -move and stretch your body, legs, feet and arms when sitting for long periods -Physical activity guidelines for optimal health in adults: -least 150 minutes per week of aerobic exercise (can talk, but not sing) once approved by your doctor, 20-30 minutes of sustained activity or two 10 minute episodes of sustained activity every day.  -resistance training at least 2  days per week if approved by your doctor -balance exercises 3+ days per week:   Stand somewhere where you have something sturdy to hold onto if you lose balance.    1) lift up on  toes, start with 5x per day and work up to 20x   2) stand and lift on leg straight out to the side so that foot is a few inches of the floor, start with 5x each side and work up to 20x each side   3) stand on one foot, start with 5 seconds each side and work up to 20 seconds on each side  If you need ideas or help with getting more active:  -Silver sneakers https://tools.silversneakers.com  -Walk with a Doc: http://www.duncan-williams.com/  -try to include resistance (weight lifting/strength building) and balance exercises twice per week: or the following link for ideas: http://castillo-powell.com/  BuyDucts.dk  STRESS MANAGEMENT: -can try meditating, or just sitting quietly with deep breathing while intentionally relaxing all parts of your body for 5 minutes daily -if you need further help with stress, anxiety or depression please follow up with your primary doctor or contact the wonderful folks at [facility]: [phone]  SOCIAL CONNECTIONS: -options in [place] if you wish to engage in more social and exercise related activities:  -Silver sneakers https://tools.silversneakers.com  -Walk with a Doc: http://www.duncan-williams.com/  -Check out the [facility] Active Adults 50+ section on the [place] [employer] (hiking clubs, book clubs, cards and games, chess, exercise classes, aquatic classes and much more) - see the website for details: https://www.[place].gov/departments/parks-recreation/active-adults50  -YouTube has lots of exercise videos for different ages and abilities as well  -[facility] (a variety of indoor and outdoor inperson activities for adults). [phone]. [address]  -Virtual Online Classes (a variety of topics): see seniorplanet.org or call [phone]  -consider volunteering at a school, hospice center, church, senior  center or elsewhere    FOR IMPROVED SLEEP AND TO RESET YOUR SLEEP SCHEDULE:  []  Schedule sleep counseling(cognitive behavioral therapy).   [facility] is a good option.   Call for appointment: [phone]  []  Exercise 30 minutes daily. Some people do better with exercise in the morning, other do better exercising later in the day.  []  Avoid caffeine and alcohol - particularly in the evenings. For some people even a little alcohol can impair sleep.   []  Go to bed and wake up at the same time everyday (within a 30 minute window). When you get up in the morning for your designated wake time - turn on lights and open curtains right away. This will help to set your internal sleep clock.   []  Keep bedroom cool, dark and quiet - if you have to get up at night make sure there is no white light from street lamps, night lights, lights, clock, devices etc. that enters your eyes. Instead, use red light flashlight or night lights and avoid looking directly at the light while up.   []  Set 1-2 hour bedtime routine, dim lights, avoid screens (phones, computers, TVs, etc during this time), consider sleepytime tea, warm bath or shower, avoid alcohol and caffeine  []  Reserve bed for sleep - do not read, watch TV, look at phone or device, etc., in bed.  []  If you toss and turn for more then 10-15 minutes, get out of bed and list or journal thoughts or do quiet activity (not screen-time, no tv, phone, computer) then go back to bed. Repeat as needed. Try not  to worry about when you will eventually fall asleep.   []  Seek help for any depression or anxiety.  [] Prescription strength sleep medications should only be used in severe cases of insomnia if other measures fail and should be used sparingly.  I hope you are feeling better soon! Follow up with your doctor in 3-4 weeks or sooner if your symptoms worsen or new concerns arise.

## 2023-02-17 ENCOUNTER — Other Ambulatory Visit: Payer: Self-pay | Admitting: Neurology

## 2023-02-17 NOTE — Telephone Encounter (Signed)
 Rx refilled to office visit date.

## 2023-02-28 ENCOUNTER — Telehealth: Payer: Self-pay

## 2023-02-28 NOTE — Telephone Encounter (Signed)
Call to patient to inquire on moving appointment. Left message to return call.  If patient returns call, please send to Jennae Hakeem.

## 2023-03-01 NOTE — Telephone Encounter (Signed)
Returned call to wife and stated no need to move appointment. Wife appreciative

## 2023-03-01 NOTE — Telephone Encounter (Signed)
At 11:51 am pt's wife left a vm returning the call to April, RN .  Wife can be reached at [phone]

## 2023-03-03 DIAGNOSIS — L570 Actinic keratosis: Secondary | ICD-10-CM | POA: Diagnosis not present

## 2023-03-03 DIAGNOSIS — Z85828 Personal history of other malignant neoplasm of skin: Secondary | ICD-10-CM | POA: Diagnosis not present

## 2023-03-03 DIAGNOSIS — L821 Other seborrheic keratosis: Secondary | ICD-10-CM | POA: Diagnosis not present

## 2023-03-03 DIAGNOSIS — C44612 Basal cell carcinoma of skin of right upper limb, including shoulder: Secondary | ICD-10-CM | POA: Diagnosis not present

## 2023-03-03 DIAGNOSIS — L814 Other melanin hyperpigmentation: Secondary | ICD-10-CM | POA: Diagnosis not present

## 2023-03-03 DIAGNOSIS — D225 Melanocytic nevi of trunk: Secondary | ICD-10-CM | POA: Diagnosis not present

## 2023-03-03 DIAGNOSIS — D0461 Carcinoma in situ of skin of right upper limb, including shoulder: Secondary | ICD-10-CM | POA: Diagnosis not present

## 2023-03-09 DIAGNOSIS — Z8639 Personal history of other endocrine, nutritional and metabolic disease: Secondary | ICD-10-CM | POA: Diagnosis not present

## 2023-03-09 DIAGNOSIS — N1832 Chronic kidney disease, stage 3b: Secondary | ICD-10-CM | POA: Diagnosis not present

## 2023-03-09 DIAGNOSIS — Z794 Long term (current) use of insulin: Secondary | ICD-10-CM | POA: Diagnosis not present

## 2023-03-09 DIAGNOSIS — E1122 Type 2 diabetes mellitus with diabetic chronic kidney disease: Secondary | ICD-10-CM | POA: Diagnosis not present

## 2023-03-14 ENCOUNTER — Encounter: Payer: Self-pay | Admitting: Neurology

## 2023-03-14 ENCOUNTER — Ambulatory Visit (INDEPENDENT_AMBULATORY_CARE_PROVIDER_SITE_OTHER): Payer: Medicare Other | Admitting: Neurology

## 2023-03-14 VITALS — BP 127/72 | HR 89 | Ht 65.0 in | Wt 173.5 lb

## 2023-03-14 DIAGNOSIS — F02A4 Dementia in other diseases classified elsewhere, mild, with anxiety: Secondary | ICD-10-CM

## 2023-03-14 DIAGNOSIS — G301 Alzheimer's disease with late onset: Secondary | ICD-10-CM

## 2023-03-14 DIAGNOSIS — E1122 Type 2 diabetes mellitus with diabetic chronic kidney disease: Secondary | ICD-10-CM | POA: Diagnosis not present

## 2023-03-14 DIAGNOSIS — N1832 Chronic kidney disease, stage 3b: Secondary | ICD-10-CM | POA: Diagnosis not present

## 2023-03-14 MED ORDER — DONEPEZIL HCL 10 MG PO TABS: 10.0000 mg | ORAL_TABLET | Freq: Every day | ORAL | 3 refills | Status: AC

## 2023-03-14 MED ORDER — CITALOPRAM HYDROBROMIDE 10 MG PO TABS: 10.0000 mg | ORAL_TABLET | Freq: Every day | ORAL | 3 refills | Status: DC

## 2023-03-14 NOTE — Patient Instructions (Signed)
Continue current medications including Aricept 10 mg nightly Start Celexa 10 mg daily, consider increasing to 20 mg in the future Discussed need to participate in physical activity Follow-up in 1 year or sooner if worse.   There are well-accepted and sensible ways to reduce risk for Alzheimers disease and other degenerative brain disorders .  Exercise Daily Walk A daily 20 minute walk should be part of your routine. Disease related apathy can be a significant roadblock to exercise and the only way to overcome this is to make it a daily routine and perhaps have a reward at the end (something your loved one loves to eat or drink perhaps) or a personal trainer coming to the home can also be very useful. Most importantly, the patient is much more likely to exercise if the caregiver / spouse does it with him/her. In general a structured, repetitive schedule is best.  General Health: Any diseases which effect your body will effect your brain such as a pneumonia, urinary infection, blood clot, heart attack or stroke. Keep contact with your primary care doctor for regular follow ups.  Sleep. A good nights sleep is healthy for the brain. Seven hours is recommended. If you have insomnia or poor sleep habits we can give you some instructions. If you have sleep apnea wear your mask.  Diet: Eating a heart healthy diet is also a good idea; fish and poultry instead of red meat, nuts (mostly non-peanuts), vegetables, fruits, olive oil or canola oil (instead of butter), minimal salt (use other spices to flavor foods), whole grain rice, bread, cereal and pasta and wine in moderation.Research is now showing that the MIND diet, which is a combination of The Mediterranean diet and the DASH diet, is beneficial for cognitive processing and longevity. Information about this diet can be found in The MIND Diet, a book by Alonna Minium, MS, RDN, and online at WildWildScience.es  Finances, Power of  [address] and Advance Directives: You should consider putting legal safeguards in place with regard to financial and medical decision making. While the spouse always has power of attorney for medical and financial issues in the absence of any form, you should consider what you want in case the spouse / caregiver is no longer around or capable of making decisions.

## 2023-03-14 NOTE — Progress Notes (Signed)
[facility]  PATIENT: Daryl Webb DOB: [birth year]  REQUESTING CLINICIAN: [place], [name], MD HISTORY FROM: Patient, wife and daughter  REASON FOR VISIT: Memory decline    HISTORICAL  CHIEF COMPLAINT:  Chief Complaint  Patient presents with   Memory Loss    Rm12, alone, Memory loss: MOCA SCORE 23   INTERVAL HISTORY 03/14/2023:  Patient presents today for follow-up, she is accompanied by wife.  Last visit was a year ago.  At that time we obtained ATN profile which showed positive biomarker for Alzheimer disease.  We started him on Aricept 10 mg nightly, he is compliant with the medication and denies any side effect from the medication.  He did however have some difficulty with sleep, I started on Trazodone but was not helpful per patient, and PCP started doxepin.  Wife tells me that he sleeps most during the day and patient claims that he does not sleep at night and does not sleep during the daytime.  They also take melatonin 10 mg at bedtime but patient tells me that sometimes he will wake up in the middle of the night and take additional melatonin.  Currently they both live in a independent living facility, they do not cook or clean, he is able to take care of himself, he drives on occasion to familiar places. Wife tells me that he does not exercise and does not participate in group activity.   Wife also reports increase anxiety mostly in the afternoon.    HISTORY OF PRESENT ILLNESS:  This is a 78 year old gentleman past medical history of hypertension, hyperlipidemia, history of GI bleed, who is presenting with complaint of memory loss.  Patient noticed that he is more forgetful than before.  He reports that he can do something in the morning and forgets that in the afternoon.  Family, mainly daughter has reported the memory problem has been going on for the past year and a half and getting worse.  He is forgetful about recent conversation, he has been getting  lost driving to familiar places and he is also repetitive meaning asked the same questions over and over. Wife reported history of GI bleed in October which require 2 endoscopies. In December he also had surgery, hernia repair and they feel since the endoscopies his memory has worsened.  He did have a CT scan in December which were negative for any acute stroke. There is also reports of sleep problems, difficulty falling asleep and frustration at the end of the day.    TBI:   No past history of TBI Stroke:   no past history of stroke Seizures:   no past history of seizures Sleep:   no history of sleep apnea.   Mood:  patient denies anxiety and depression Family history of Dementia:   Denies  Functional status: Dependent in some ADLs and IADLs Patient lives with spouse in a independent living facility  Cooking: not much Cleaning: some Shopping: some Bathing: self, no help needed Toileting: self, no help needed Driving: Still drive but gets lots in familiar places  Bills: Trouble balancing check book  Medications: wife helps Ever left the stove on by accident?: Denies  Forget how to use items around the house?: denies Getting lost going to familiar places?: Yes  Forgetting loved ones names?: Yes  Word finding difficulty? Yes  Sleep: Does not sleep    OTHER MEDICAL CONDITIONS: Hypertension, Hyperlipidemia, History of GI Bleed, anemia,    REVIEW OF SYSTEMS: Full 14 system  review of systems performed and negative with exception of: as noted in the HPI   ALLERGIES: Allergies  Allergen Reactions   Aspirin Other (See Comments)    Bleeding ulcer   Diclofenac Other (See Comments)    Bleeding ulcer   Excedrin Tension Headache [Acetaminophen-Caffeine] Other (See Comments)    Bleeding ulcer   Motrin [Ibuprofen] Other (See Comments)    Bleeding ulcer    HOME MEDICATIONS: Outpatient Medications Prior to Visit  Medication Sig Dispense Refill   amLODipine (NORVASC) 2.5 MG tablet  TAKE 1 TABLET BY MOUTH EVERY DAY 90 tablet 2   atorvastatin (LIPITOR) 40 MG tablet Take 1 tablet (40 mg total) by mouth daily. 90 tablet 3   BD PEN NEEDLE NANO 2ND GEN 32G X 4 MM MISC daily.     calcium carbonate (TUMS - DOSED IN MG ELEMENTAL CALCIUM) 500 MG chewable tablet Chew 2 tablets by mouth daily as needed for indigestion or heartburn.     Cholecalciferol (VITAMIN D3) 1000 units CAPS Take 2,000 Units by mouth daily. 1 capsule 0   Coenzyme Q10 (COQ10) 100 MG CAPS Take 100 mg by mouth daily.     Continuous Glucose Receiver (DEXCOM G7 RECEIVER) DEVI use to monitor blood sugar for 365 days     Continuous Glucose Sensor (DEXCOM G7 SENSOR) MISC Use to check sugar continuously.Dx:E11.9 3 each 3   doxepin (SINEQUAN) 10 MG capsule TAKE 1 CAPSULE BY MOUTH AT BEDTIME. 90 capsule 1   FARXIGA 10 MG TABS tablet Take 10 mg by mouth in the morning.     ferrous gluconate (FERGON) 324 MG tablet Take 324 mg by mouth every other day.     Insulin Glargine (BASAGLAR KWIKPEN) 100 UNIT/ML 10 units (titrate as directed up to 50 units per day) Subcutaneous once a day for 30 days     losartan-hydrochlorothiazide (HYZAAR) 100-25 MG tablet Take 1 tablet by mouth daily.     Melatonin 12 MG TABS Take 5 mg by mouth at bedtime.     metoprolol succinate (TOPROL-XL) 50 MG 24 hr tablet TAKE 1 TABLET DAILY, WITH  OR IMMEDIATELY FOLLOWING A MEAL 90 tablet 3   NON FORMULARY Take 2 capsules by mouth See admin instructions. Vicks ZzzQuil Pure Zzzs Triple Action Gummies- Chew 2 gummies by mouth every night at bedtime     OZEMPIC, 0.25 OR 0.5 MG/DOSE, 2 MG/3ML SOPN Inject into the skin once a week.     pantoprazole (PROTONIX) 20 MG tablet Take 20 mg by mouth daily.     TYLENOL 500 MG tablet Take 1,000 mg by mouth every 6 (six) hours as needed for headache.     donepezil (ARICEPT) 10 MG tablet TAKE 1 TABLET BY MOUTH EVERYDAY AT BEDTIME 30 tablet 0   No facility-administered medications prior to visit.    PAST MEDICAL  HISTORY: Past Medical History:  Diagnosis Date   Acute blood loss anemia 06/27/2012   Anemia    Arthritis    CKD (chronic kidney disease)    Constipation due to pain medication    needs stool softner while on pain medication   Diabetes mellitus    Duodenal ulcer 08/13/2015   2014  - Dr Kaplan/GI   Duodenal ulcer with hemorrhage 08/13/2015   2014  - Dr Kaplan/GI   Dyspnea 06/19/2012   GERD (gastroesophageal reflux disease)    H/O: osteoarthritis    Heart murmur    History of kidney stones    History of prostate cancer  History of urethral stricture    Hypercholesterolemia    Hypertension    Melena 06/19/2012   PONV (postoperative nausea and vomiting)    Prostate cancer (HCC)    prostate   S/P TAVR (transcatheter aortic valve replacement) 08/12/2020   s/p TAVR with a 29 mm Edwards Sapien 3 THV via the TF approach by Dr. Excell Seltzer and Dr. Laneta Simmers   Severe aortic stenosis    Spinal stenosis    Ulcer duodenal hemorrhage     PAST SURGICAL HISTORY: Past Surgical History:  Procedure Laterality Date   ARTHROPLASTY  2004   right knee Dr. Candie Chroman SURGERY     BIOPSY  11/21/2021   Procedure: BIOPSY;  Surgeon: Sherrilyn Rist, MD;  Location: [facility];  Service: Gastroenterology;;   CARDIAC CATHETERIZATION     CARPAL TUNNEL RELEASE     right hand,wrist Dr. Teressa Senter   CYSTOSCOPY N/A 09/20/2012   Procedure: Bluford Kaufmann;  Surgeon: Crecencio Mc, MD;  Location: [facility];  Service: Urology;  Laterality: N/A;   ESOPHAGOGASTRODUODENOSCOPY N/A 11/21/2021   Procedure: ESOPHAGOGASTRODUODENOSCOPY (EGD);  Surgeon: Sherrilyn Rist, MD;  Location: [facility];  Service: Gastroenterology;  Laterality: N/A;   ESOPHAGOGASTRODUODENOSCOPY (EGD) WITH PROPOFOL N/A 11/30/2021   Procedure: ESOPHAGOGASTRODUODENOSCOPY (EGD) WITH PROPOFOL;  Surgeon: Beverley Fiedler, MD;  Location: [facility];  Service: Gastroenterology;  Laterality: N/A;   HEMOSTASIS CLIP PLACEMENT  11/30/2021   Procedure:  HEMOSTASIS CLIP PLACEMENT;  Surgeon: Beverley Fiedler, MD;  Location: [facility];  Service: Gastroenterology;;   INGUINAL HERNIA REPAIR Right 01/14/2022   Procedure: RIGHT OPEN INGUINAL HERNIA REPAIR WITH MESH;  Surgeon: Kinsinger, De Blanch, MD;  Location: [facility];  Service: General;  Laterality: Right;   JOINT REPLACEMENT Right    KNEE ARTHROSCOPY Right    LUMBAR LAMINECTOMY  02/08/2013   PROSTATECTOMY     radical   RIGHT/LEFT HEART CATH AND CORONARY ANGIOGRAPHY N/A 05/08/2020   Procedure: RIGHT/LEFT HEART CATH AND CORONARY ANGIOGRAPHY;  Surgeon: Tonny Bollman, MD;  Location: [facility] INVASIVE CV LAB;  Service: Cardiovascular;  Laterality: N/A;   TEE WITHOUT CARDIOVERSION N/A 08/12/2020   Procedure: TRANSESOPHAGEAL ECHOCARDIOGRAM (TEE);  Surgeon: Tonny Bollman, MD;  Location: [facility] INVASIVE CV LAB;  Service: Open Heart Surgery;  Laterality: N/A;   TOOTH EXTRACTION  12/2021   TRANSCATHETER AORTIC VALVE REPLACEMENT, TRANSFEMORAL N/A 08/12/2020   Procedure: TRANSCATHETER AORTIC VALVE REPLACEMENT, TRANSFEMORAL;  Surgeon: Tonny Bollman, MD;  Location: [facility] INVASIVE CV LAB;  Service: Open Heart Surgery;  Laterality: N/A;   VASECTOMY  1985    FAMILY HISTORY: Family History  Problem Relation Age of Onset   Heart disease Mother    Heart failure Mother    Heart disease Father    Diabetes Father    Colon cancer Neg Hx    Esophageal cancer Neg Hx    Inflammatory bowel disease Neg Hx    Liver disease Neg Hx    Pancreatic cancer Neg Hx    Rectal cancer Neg Hx    Stomach cancer Neg Hx     SOCIAL HISTORY: Social History   Socioeconomic History   Marital status: Married    Spouse name: Not on file   Number of children: 2   Years of education: 16   Highest education level: Bachelor's degree (e.g., BA, AB, BS)  Occupational History   Occupation: Retired  Tobacco Use   Smoking status: Former    Current packs/day: 0.00    Average packs/day: 1.5 packs/day for  20.0 years (30.0 ttl pk-yrs)     Types: Cigarettes    Start date: 02/09/1960    Quit date: 02/09/1980    Years since quitting: 43.1   Smokeless tobacco: Never  Vaping Use   Vaping status: Never Used  Substance and Sexual Activity   Alcohol use: No   Drug use: No   Sexual activity: Not Currently  Other Topics Concern   Not on file  Social History Narrative   Retired from Engineer, structural (boats).    Lives with wife,    two children in [place].    Enjoys reading, boating, travelling to [place] in the winter with wife.   Social Drivers of Corporate investment banker Strain: Low Risk  (12/24/2021)   Overall Financial Resource Strain (CARDIA)    Difficulty of Paying Living Expenses: Not hard at all  Food Insecurity: No Food Insecurity (04/21/2022)   Hunger Vital Sign    Worried About Running Out of Food in the Last Year: Never true    Ran Out of Food in the Last Year: Never true  Transportation Needs: No Transportation Needs (04/21/2022)   PRAPARE - Administrator, Civil Service (Medical): No    Lack of Transportation (Non-Medical): No  Physical Activity: Inactive (12/24/2021)   Exercise Vital Sign    Days of Exercise per Week: 0 days    Minutes of Exercise per Session: 0 min  Stress: No Stress Concern Present (12/24/2021)   [employer] of Occupational Health - Occupational Stress Questionnaire    Feeling of Stress : Not at all  Social Connections: Moderately Isolated (12/24/2021)   Social Connection and Isolation Panel [NHANES]    Frequency of Communication with Friends and Family: More than three times a week    Frequency of Social Gatherings with Friends and Family: More than three times a week    Attends Religious Services: Never    Database administrator or Organizations: No    Attends Banker Meetings: Never    Marital Status: Married  Catering manager Violence: Not At Risk (12/24/2021)   Humiliation, Afraid, Rape, and Kick questionnaire    Fear of Current or Ex-Partner: No     Emotionally Abused: No    Physically Abused: No    Sexually Abused: No     PHYSICAL EXAM  GENERAL EXAM/CONSTITUTIONAL: Vitals:  Vitals:   03/14/23 1529  BP: 127/72  Pulse: 89  Weight: 173 lb 8 oz (78.7 kg)  Height: 5\' 5"  (1.651 m)   Body mass index is 28.87 kg/m. Wt Readings from Last 3 Encounters:  03/14/23 173 lb 8 oz (78.7 kg)  12/07/22 173 lb 6.4 oz (78.7 kg)  09/28/22 172 lb (78 kg)   Patient is in no distress; well developed, nourished and groomed; neck is supple  MUSCULOSKELETAL: Gait, strength, tone, movements noted in Neurologic exam below  NEUROLOGIC: MENTAL STATUS:     02/20/2018    1:41 PM 02/15/2017    4:52 PM  MMSE - Mini Mental State Exam  Not completed:  --  Orientation to time 5   Orientation to Place 5   Registration 3   Attention/ Calculation 5   Recall 3   Language- name 2 objects 2   Language- repeat 1   Language- follow 3 step command 3   Language- read & follow direction 1   Write a sentence 1   Copy design 1   Total score 30       03/14/2023  3:30 PM 03/09/2022    3:20 PM  Montreal Cognitive Assessment   Visuospatial/ Executive (0/5) 3 4  Naming (0/3) 3 3  Attention: Read list of digits (0/2) 2 2  Attention: Read list of letters (0/1) 1 1  Attention: Serial 7 subtraction starting at 100 (0/3) 2 3  Language: Repeat phrase (0/2) 2 2  Language : Fluency (0/1) 1 1  Abstraction (0/2) 2 1  Delayed Recall (0/5) 1 0  Orientation (0/6) 6 6  Total 23 23    CRANIAL NERVE:  2nd, 3rd, 4th, 6th- visual fields full to confrontation, extraocular muscles intact, no nystagmus 5th - facial sensation symmetric 7th - facial strength symmetric 8th - hearing intact 9th - palate elevates symmetrically, uvula midline 11th - shoulder shrug symmetric 12th - tongue protrusion midline  MOTOR:  normal bulk and tone, full strength in the BUE, BLE  SENSORY:  normal and symmetric to light touch  COORDINATION:  finger-nose-finger, fine finger  movements normal  GAIT/STATION:  Antalgic gait    DIAGNOSTIC DATA (LABS, IMAGING, TESTING) - I reviewed patient records, labs, notes, testing and imaging myself where available.  Lab Results  Component Value Date   WBC 7.4 04/26/2022   HGB 13.6 04/26/2022   HCT 40.6 04/26/2022   MCV 90.3 04/26/2022   PLT 161.0 04/26/2022      Component Value Date/Time   NA 139 04/26/2022 1122   NA 140 05/02/2020 0000   K 4.0 04/26/2022 1122   CL 103 04/26/2022 1122   CO2 26 04/26/2022 1122   GLUCOSE 122 (H) 04/26/2022 1122   BUN 33 (H) 04/26/2022 1122   BUN 25 05/02/2020 0000   CREATININE 1.77 (H) 04/26/2022 1122   CREATININE 1.57 (H) 11/19/2019 1031   CALCIUM 9.4 04/26/2022 1122   PROT 7.3 03/04/2022 0226   ALBUMIN 3.6 03/04/2022 0226   AST 20 03/04/2022 0226   ALT 19 03/04/2022 0226   ALKPHOS 87 03/04/2022 0226   BILITOT 0.5 03/04/2022 0226   GFRNONAA 40 (L) 03/04/2022 0226   GFRNONAA 43 (L) 11/19/2019 1031   GFRAA 50 (L) 11/19/2019 1031   Lab Results  Component Value Date   CHOL 85 06/01/2022   HDL 26 (A) 06/01/2022   LDLCALC 33 06/01/2022   LDLDIRECT 73.8 11/20/2012   TRIG 152 06/01/2022   CHOLHDL 3 06/28/2017   Lab Results  Component Value Date   HGBA1C 8.4 06/01/2022   Lab Results  Component Value Date   VITAMINB12 349 06/01/2022   Lab Results  Component Value Date   TSH 3.28 12/09/2021   ATN Profile consistent with Alzheimer disease pathology    Head CT 01/31/2022 Periventricular white matter changes consistent with chronic small vessel ischemia. Chronic left-sided sinusitis. No acute intracranial process identified   ASSESSMENT AND PLAN  78 y.o. year old male with history of hypertension, hyperlipidemia, anemia and GI bleed who is presenting for follow up for his mild Alzheimer Dementia.  He is compliant with his Aricept 10 mg nightly but has some difficulty with his sleep medication.  Advised wife to continuing with doxepin but to give Melatonin 10 mg, 3  hours before his bedtime and make sure he does not take an additional melatonin in the middle of the night. We also spent additional time discussing the need to participate in physical activity.  They currently live in an independently living facility, they do have physical activities and I spent additional time with patient discussing need to participate.  He voiced understanding.  I will also start him on Celexa 10 mg daily for his symptoms of anxiety.  Continue to follow with PCP and return in a year or sooner if worse   1. Mild late onset Alzheimer's dementia with anxiety [facility])     Patient Instructions  Continue current medications including Aricept 10 mg nightly Start Celexa 10 mg daily, consider increasing to 20 mg in the future Discussed need to participate in physical activity Follow-up in 1 year or sooner if worse.   There are well-accepted and sensible ways to reduce risk for Alzheimers disease and other degenerative brain disorders .  Exercise Daily Walk A daily 20 minute walk should be part of your routine. Disease related apathy can be a significant roadblock to exercise and the only way to overcome this is to make it a daily routine and perhaps have a reward at the end (something your loved one loves to eat or drink perhaps) or a personal trainer coming to the home can also be very useful. Most importantly, the patient is much more likely to exercise if the caregiver / spouse does it with him/her. In general a structured, repetitive schedule is best.  General Health: Any diseases which effect your body will effect your brain such as a pneumonia, urinary infection, blood clot, heart attack or stroke. Keep contact with your primary care doctor for regular follow ups.  Sleep. A good nights sleep is healthy for the brain. Seven hours is recommended. If you have insomnia or poor sleep habits we can give you some instructions. If you have sleep apnea wear your mask.  Diet: Eating a  heart healthy diet is also a good idea; fish and poultry instead of red meat, nuts (mostly non-peanuts), vegetables, fruits, olive oil or canola oil (instead of butter), minimal salt (use other spices to flavor foods), whole grain rice, bread, cereal and pasta and wine in moderation.Research is now showing that the MIND diet, which is a combination of The Mediterranean diet and the DASH diet, is beneficial for cognitive processing and longevity. Information about this diet can be found in The MIND Diet, a book by Alonna Minium, MS, RDN, and online at [employer]Wild[employer].es  Finances, Power of [address] and Advance Directives: You should consider putting legal safeguards in place with regard to financial and medical decision making. While the spouse always has power of attorney for medical and financial issues in the absence of any form, you should consider what you want in case the spouse / caregiver is no longer around or capable of making decisions.     No orders of the defined types were placed in this encounter.   Meds ordered this encounter  Medications   donepezil (ARICEPT) 10 MG tablet    Sig: Take 1 tablet (10 mg total) by mouth at bedtime.    Dispense:  90 tablet    Refill:  3   citalopram (CELEXA) 10 MG tablet    Sig: Take 1 tablet (10 mg total) by mouth daily.    Dispense:  30 tablet    Refill:  3    Return in about 1 year (around 03/13/2024).   Windell Norfolk, MD 03/14/2023, 7:31 PM  [facility] [address] [place], [place] [address] [phone]

## 2023-03-23 DIAGNOSIS — E1122 Type 2 diabetes mellitus with diabetic chronic kidney disease: Secondary | ICD-10-CM | POA: Diagnosis not present

## 2023-03-23 DIAGNOSIS — I129 Hypertensive chronic kidney disease with stage 1 through stage 4 chronic kidney disease, or unspecified chronic kidney disease: Secondary | ICD-10-CM | POA: Diagnosis not present

## 2023-03-23 DIAGNOSIS — N1832 Chronic kidney disease, stage 3b: Secondary | ICD-10-CM | POA: Diagnosis not present

## 2023-03-23 DIAGNOSIS — E663 Overweight: Secondary | ICD-10-CM | POA: Diagnosis not present

## 2023-03-23 DIAGNOSIS — Z87448 Personal history of other diseases of urinary system: Secondary | ICD-10-CM | POA: Diagnosis not present

## 2023-03-23 DIAGNOSIS — E1129 Type 2 diabetes mellitus with other diabetic kidney complication: Secondary | ICD-10-CM | POA: Diagnosis not present

## 2023-03-31 ENCOUNTER — Telehealth: Payer: Self-pay | Admitting: Family Medicine

## 2023-03-31 ENCOUNTER — Other Ambulatory Visit (INDEPENDENT_AMBULATORY_CARE_PROVIDER_SITE_OTHER): Payer: Medicare Other

## 2023-03-31 ENCOUNTER — Ambulatory Visit: Payer: Self-pay | Admitting: Family Medicine

## 2023-03-31 DIAGNOSIS — R300 Dysuria: Secondary | ICD-10-CM

## 2023-03-31 LAB — URINALYSIS, ROUTINE W REFLEX MICROSCOPIC
Bilirubin Urine: NEGATIVE
Ketones, ur: NEGATIVE
Nitrite: NEGATIVE
Specific Gravity, Urine: 1.025 (ref 1.000–1.030)
Total Protein, Urine: 30 — AB
Urine Glucose: 500 — AB
Urobilinogen, UA: 0.2 (ref 0.0–1.0)
pH: 6 (ref 5.0–8.0)

## 2023-03-31 NOTE — Telephone Encounter (Signed)
Please call wife with urine test results--she states that patient will not understand, he has dementia.  Call the mobile number on file.

## 2023-03-31 NOTE — Telephone Encounter (Signed)
Summary: Cloudy Urine   Copied From CRM [id number]. Reason for Triage: Patient has cloudy urine, possible UTI - wants to give urine sample. Please call spouse Misty Stanley at [phone] before 11:30am if possible         Chief Complaint: cloudy urine Symptoms: cloudy urine, pulling at sides and groin Frequency: this am Pertinent Negatives: Patient denies pain, sob, cp, flank pain, blood in urine Disposition: [] ED /[] Urgent Care (no appt availability in office) / [] Appointment(In office/virtual)/ []  [facility]/ [] Home Care/ [] Refused Recommended Disposition /[] [facility]/ [x]  Follow-up with PCP Additional Notes: Office to call wife back, she would like to drop urine sample off.  Hx dementia and they do have apt on Monday.    Reason for Disposition  Urinating more frequently than usual (i.e., frequency)  Answer Assessment - Initial Assessment Questions 1. SYMPTOM: "What's the main symptom you're concerned about?" (e.g., frequency, incontinence)     Cloudy urine, he grabs sides and goin 2. ONSET: "When did the  cloudy urine  start?"     This am 3. PAIN: "Is there any pain?" If Yes, ask: "How bad is it?" (Scale: 1-10; mild, moderate, severe)     "Not really" 4. CAUSE: "What do you think is causing the symptoms?"     uti 5. OTHER SYMPTOMS: "Do you have any other symptoms?" (e.g., blood in urine, fever, flank pain, pain with urination)     Denies. 6. PREGNANCY: "Is there any chance you are pregnant?" "When was your last menstrual period?"     na  Protocols used: Urinary Symptoms-A-AH

## 2023-04-01 ENCOUNTER — Ambulatory Visit: Payer: Medicare Other | Admitting: Family Medicine

## 2023-04-01 NOTE — Telephone Encounter (Signed)
We offered appt for today and wife declined. She prefers to come Monday. BJ

## 2023-04-02 LAB — URINE CULTURE
MICRO NUMBER:: 16108504
SPECIMEN QUALITY:: ADEQUATE

## 2023-04-04 ENCOUNTER — Ambulatory Visit: Payer: Medicare Other | Admitting: Family Medicine

## 2023-04-04 ENCOUNTER — Ambulatory Visit (INDEPENDENT_AMBULATORY_CARE_PROVIDER_SITE_OTHER): Payer: Medicare Other | Admitting: Family Medicine

## 2023-04-04 ENCOUNTER — Encounter: Payer: Self-pay | Admitting: Family Medicine

## 2023-04-04 VITALS — BP 120/70 | HR 88 | Resp 16 | Ht 65.0 in | Wt 171.2 lb

## 2023-04-04 DIAGNOSIS — G4719 Other hypersomnia: Secondary | ICD-10-CM | POA: Diagnosis not present

## 2023-04-04 DIAGNOSIS — G4700 Insomnia, unspecified: Secondary | ICD-10-CM | POA: Diagnosis not present

## 2023-04-04 DIAGNOSIS — N390 Urinary tract infection, site not specified: Secondary | ICD-10-CM | POA: Diagnosis not present

## 2023-04-04 DIAGNOSIS — E1122 Type 2 diabetes mellitus with diabetic chronic kidney disease: Secondary | ICD-10-CM | POA: Diagnosis not present

## 2023-04-04 DIAGNOSIS — I10 Essential (primary) hypertension: Secondary | ICD-10-CM

## 2023-04-04 DIAGNOSIS — N1832 Chronic kidney disease, stage 3b: Secondary | ICD-10-CM

## 2023-04-04 MED ORDER — SULFAMETHOXAZOLE-TRIMETHOPRIM 800-160 MG PO TABS: 1.0000 | ORAL_TABLET | Freq: Two times a day (BID) | ORAL | 0 refills | Status: AC

## 2023-04-04 NOTE — Assessment & Plan Note (Signed)
 Problem is adequately controlled. Currently on amlodipine 2.5 mg daily, losartan-HCTZ 100-25 mg daily, and metoprolol succinate 50 mg daily.

## 2023-04-04 NOTE — Patient Instructions (Addendum)
 A few things to remember from today's visit:  Urinary tract infection without hematuria, site unspecified - Plan: sulfamethoxazole-trimethoprim (BACTRIM DS) 800-160 MG tablet  Daytime hypersomnolence  Essential hypertension  Insomnia, unspecified type  We are starting antibiotic today. Star weaning off Doxepin, take it every other day for 10 days then every 3rd day for 10 days and stop. No changes in rest.  If you need refills for medications you take chronically, please call your pharmacy. Do not use My Chart to request refills or for acute issues that need immediate attention. If you send a my chart message, it may take a few days to be addressed, specially if I am not in the office.  Please be sure medication list is accurate. If a new problem present, please set up appointment sooner than planned today.

## 2023-04-04 NOTE — Assessment & Plan Note (Signed)
 Probably does not seem to be adequately controlled. He is currently on doxepin 10 mg, which could be aggravating daytime somnolence. Stressed the importance of good sleep hygiene. Will start weaning off doxepin. Continue OTC melatonin.

## 2023-04-04 NOTE — Progress Notes (Signed)
 ACUTE VISIT Chief Complaint  Patient presents with   Dementia   HPI: Daryl Webb is a 78 y.o. male with a PMHx significant for HTN, atherosclerosis of aorta, GI bleeding, DM II, alzheimer's dementia, OA, CKD III, prostate cancer, iron deficiency anemia, and insomnia, among some, who is here today with his wife and daughter for memory, sleep, and urinary problems.  He was last seen on 08/09/2022. Since his last visit he has followed with neurologist, endocrinologist, and nephrologist.  Fatigue/sleep issues:  Patient's wife and daughter state he has been sleeping up to 15-20 hours per day. This problem has been worsening over the last six months.  Insomnia:He is currently on doxepin 10 mg and melatonin 15 mg every night. According to his wife, even thought he is in bed for hours he doe snot get but 3-4 hours of deep sleep.  He was put on Celexa 10 mg at night at his last visit with neurology, but his wife has switched to giving it to him in the morning due to his drowsiness.   At that visit with neurology, he was told he is still in early stage of Alzheimer's disease.   His wife notes his blood sugar is varying significantly because he isn't eating on a regular schedule, and she is trying to adjust his insulin to keep it normal.  At his last visit with nephrology on 2/12, reports that proteinuria has improved but not resolved, so Ozempic dose was increased to 2 mg weekly, but she has not given it to him at that dose yet because his sugars have been low.   Wife also notes he is not snoring loudly or showing other symptoms of sleep apnea.  He has been checking his BP at home and it has not been low.   Hypertension on amlodipine 2.5 mg daily, metoprolol succinate 50 mg daily, and losartan-HCTZ 100-25 mg daily. Negative for unusual headache, CP, dyspnea, palpitation, or edema. CKD III: Saw his nephrologist earlier this month. On Farxiga 10 mg daily.  Dysuria: Patient also  complains of penile pain while urinating since last week.  He has not noted genital lesions. Also endorses cloudy urine. Denies fever, chills, abdominal pain, rectal pain, changes in his stream, or blood in his urine.  S/P prostatectomy.  Urine culture done on 03/31/2023 grew E. Coli > 100,000 CFU.  Review of Systems  Constitutional:  Positive for activity change and fatigue. Negative for appetite change.  HENT:  Negative for sore throat.   Respiratory:  Negative for cough and wheezing.   Gastrointestinal:  Negative for abdominal pain, nausea and vomiting.  Endocrine: Negative for cold intolerance and heat intolerance.  Genitourinary:  Negative for penile discharge, penile swelling and testicular pain.  Skin:  Negative for rash.  Neurological:  Negative for syncope and facial asymmetry.  Psychiatric/Behavioral:  Positive for sleep disturbance. Negative for hallucinations. The patient is nervous/anxious.   See other pertinent positives and negatives in HPI.  Current Outpatient Medications on File Prior to Visit  Medication Sig Dispense Refill   amLODipine (NORVASC) 2.5 MG tablet TAKE 1 TABLET BY MOUTH EVERY DAY 90 tablet 2   atorvastatin (LIPITOR) 40 MG tablet Take 1 tablet (40 mg total) by mouth daily. 90 tablet 3   BD PEN NEEDLE NANO 2ND GEN 32G X 4 MM MISC daily.     calcium carbonate (TUMS - DOSED IN MG ELEMENTAL CALCIUM) 500 MG chewable tablet Chew 2 tablets by mouth daily as needed for indigestion or  heartburn.     Cholecalciferol (VITAMIN D3) 1000 units CAPS Take 2,000 Units by mouth daily. 1 capsule 0   citalopram (CELEXA) 10 MG tablet Take 1 tablet (10 mg total) by mouth daily. 30 tablet 3   Coenzyme Q10 (COQ10) 100 MG CAPS Take 100 mg by mouth daily.     Continuous Glucose Receiver (DEXCOM G7 RECEIVER) DEVI use to monitor blood sugar for 365 days     Continuous Glucose Sensor (DEXCOM G7 SENSOR) MISC Use to check sugar continuously.Dx:E11.9 3 each 3   donepezil (ARICEPT) 10 MG  tablet Take 1 tablet (10 mg total) by mouth at bedtime. 90 tablet 3   doxepin (SINEQUAN) 10 MG capsule TAKE 1 CAPSULE BY MOUTH AT BEDTIME. 90 capsule 1   FARXIGA 10 MG TABS tablet Take 10 mg by mouth in the morning.     ferrous gluconate (FERGON) 324 MG tablet Take 324 mg by mouth every other day.     Insulin Glargine (BASAGLAR KWIKPEN) 100 UNIT/ML 10 units (titrate as directed up to 50 units per day) Subcutaneous once a day for 30 days     losartan-hydrochlorothiazide (HYZAAR) 100-25 MG tablet Take 1 tablet by mouth daily.     Melatonin 12 MG TABS Take 5 mg by mouth at bedtime.     metoprolol succinate (TOPROL-XL) 50 MG 24 hr tablet TAKE 1 TABLET DAILY, WITH  OR IMMEDIATELY FOLLOWING A MEAL 90 tablet 3   NON FORMULARY Take 2 capsules by mouth See admin instructions. Vicks ZzzQuil Pure Zzzs Triple Action Gummies- Chew 2 gummies by mouth every night at bedtime     OZEMPIC, 0.25 OR 0.5 MG/DOSE, 2 MG/3ML SOPN Inject into the skin once a week.     pantoprazole (PROTONIX) 20 MG tablet Take 20 mg by mouth daily.     TYLENOL 500 MG tablet Take 1,000 mg by mouth every 6 (six) hours as needed for headache.     No current facility-administered medications on file prior to visit.    Past Medical History:  Diagnosis Date   Acute blood loss anemia 06/27/2012   Anemia    Arthritis    CKD (chronic kidney disease)    Constipation due to pain medication    needs stool softner while on pain medication   Diabetes mellitus    Duodenal ulcer 08/13/2015   2014  - Dr Kaplan/GI   Duodenal ulcer with hemorrhage 08/13/2015   2014  - Dr Kaplan/GI   Dyspnea 06/19/2012   GERD (gastroesophageal reflux disease)    H/O: osteoarthritis    Heart murmur    History of kidney stones    History of prostate cancer    History of urethral stricture    Hypercholesterolemia    Hypertension    Melena 06/19/2012   PONV (postoperative nausea and vomiting)    Prostate cancer (HCC)    prostate   S/P TAVR (transcatheter  aortic valve replacement) 08/12/2020   s/p TAVR with a 29 mm Edwards Sapien 3 THV via the TF approach by Dr. Excell Seltzer and Dr. Laneta Simmers   Severe aortic stenosis    Spinal stenosis    Ulcer duodenal hemorrhage    Allergies  Allergen Reactions   Aspirin Other (See Comments)    Bleeding ulcer   Diclofenac Other (See Comments)    Bleeding ulcer   Excedrin Tension Headache [Acetaminophen-Caffeine] Other (See Comments)    Bleeding ulcer   Motrin [Ibuprofen] Other (See Comments)    Bleeding ulcer    Social History  Socioeconomic History   Marital status: Married    Spouse name: Not on file   Number of children: 2   Years of education: 16   Highest education level: Bachelor's degree (e.g., BA, AB, BS)  Occupational History   Occupation: Retired  Tobacco Use   Smoking status: Former    Current packs/day: 0.00    Average packs/day: 1.5 packs/day for 20.0 years (30.0 ttl pk-yrs)    Types: Cigarettes    Start date: 02/09/1960    Quit date: 02/09/1980    Years since quitting: 43.1   Smokeless tobacco: Never  Vaping Use   Vaping status: Never Used  Substance and Sexual Activity   Alcohol use: No   Drug use: No   Sexual activity: Not Currently  Other Topics Concern   Not on file  Social History Narrative   Retired from Engineer, structural (boats).    Lives with wife,    two children in [place].    Enjoys reading, boating, travelling to [place] in the winter with wife.   Social Drivers of Corporate investment banker Strain: Low Risk  (12/24/2021)   Overall Financial Resource Strain (CARDIA)    Difficulty of Paying Living Expenses: Not hard at all  Food Insecurity: No Food Insecurity (04/21/2022)   Hunger Vital Sign    Worried About Running Out of Food in the Last Year: Never true    Ran Out of Food in the Last Year: Never true  Transportation Needs: No Transportation Needs (04/21/2022)   PRAPARE - Administrator, Civil Service (Medical): No    Lack of Transportation  (Non-Medical): No  Physical Activity: Inactive (12/24/2021)   Exercise Vital Sign    Days of Exercise per Week: 0 days    Minutes of Exercise per Session: 0 min  Stress: No Stress Concern Present (12/24/2021)   [employer] of Occupational Health - Occupational Stress Questionnaire    Feeling of Stress : Not at all  Social Connections: Moderately Isolated (12/24/2021)   Social Connection and Isolation Panel [NHANES]    Frequency of Communication with Friends and Family: More than three times a week    Frequency of Social Gatherings with Friends and Family: More than three times a week    Attends Religious Services: Never    Database administrator or Organizations: No    Attends Banker Meetings: Never    Marital Status: Married   Vitals:   04/04/23 1529  BP: 120/70  Pulse: 88  Resp: 16  SpO2: 95%   Body mass index is 28.5 kg/m.  Physical Exam Vitals and nursing note reviewed.  Constitutional:      General: He is not in acute distress.    Appearance: He is well-developed.  HENT:     Head: Normocephalic and atraumatic.     Mouth/Throat:     Mouth: Mucous membranes are dry.     Pharynx: Oropharynx is clear. Uvula midline.  Eyes:     Conjunctiva/sclera: Conjunctivae normal.  Cardiovascular:     Rate and Rhythm: Normal rate and regular rhythm.     Pulses:          Posterior tibial pulses are 2+ on the right side and 2+ on the left side.     Heart sounds: No murmur heard.    Comments: Trace LE edema, bilateral. Pulmonary:     Effort: Pulmonary effort is normal. No respiratory distress.     Breath sounds: Normal breath sounds.  Abdominal:     Palpations: Abdomen is soft. There is no hepatomegaly or mass.     Tenderness: There is no abdominal tenderness.  Lymphadenopathy:     Cervical: No cervical adenopathy.  Skin:    General: Skin is warm.     Findings: No erythema or rash.  Neurological:     General: No focal deficit present.     Mental Status:  He is alert.     Comments: He is able to recall the month and year without difficulty, but not the exact date.  Oriented in person and place.  Psychiatric:        Mood and Affect: Affect normal.   ASSESSMENT AND PLAN:  Mr. Mcfarren was seen today to discuss dementia.   Daytime hypersomnolence Problem has been going on for 6 months, gradually getting worse. We discussed possible etiologies. No known OSA. Some of his medications can be aggravating problem and some of his co morbilities can also be contributing factors. Encouraged adequate hydration. He has had blood work done at his nephrologist's and endocrinologist's office, so I do not think we need any today. Recommend weaning off Doxepin. Recently started on Celexa for depression and irritability.  Essential hypertension Assessment & Plan: Problem is adequately controlled. Currently on amlodipine 2.5 mg daily, losartan-HCTZ 100-25 mg daily, and metoprolol succinate 50 mg daily. Continue monitoring BP at home.  Insomnia, unspecified type Assessment & Plan: Probably does not seem to be adequately controlled. He is currently on doxepin 10 mg, which could be aggravating daytime somnolence. Stressed the importance of good sleep hygiene. Will start weaning off doxepin. Continue OTC melatonin.  Type 2 diabetes mellitus with stage 3b chronic kidney disease, without long-term current use of insulin (HCC) Assessment & Plan: Problem seems to be better controlled, following with endocrinologist. Last hemoglobin A1c was 6.5 in 02/2023.  Stage 3b chronic kidney disease (HCC) Assessment & Plan: Reporting problem as stable.  Last follow-up visit earlier this month. Following with nephrologist.  Urinary tract infection without hematuria, site unspecified A week of dysuria, no other associated symptom. Ucx 03/31/23 grew E coli susceptible to trimeth/sulfa, so Bactrim recommended. His last renal tenst at his nephrologist's offcie  reported as stable, no abx dose was not adjusted. Instructed about warning signs.  -     Sulfamethoxazole-Trimethoprim; Take 1 tablet by mouth 2 (two) times daily for 7 days.  Dispense: 14 tablet; Refill: 0  I spent a total of 41 minutes in both face to face and non face to face activities for this visit on the date of this encounter. During this time history was obtained and documented, examination was performed, prior labs reviewed, and assessment/plan discussed.  Return if symptoms worsen or fail to improve, for keep next appointment.  I, Rolla Etienne Wierda, acting as a scribe for Natali Lavallee [place], MD., have documented all relevant documentation on the behalf of Chelsae Zanella [place], MD, as directed by  Amarionna Arca [place], MD while in the presence of Berda Shelvin [place], MD.   I, Eleanor Gatliff [place], MD, have reviewed all documentation for this visit. The documentation on 04/04/23 for the exam, diagnosis, procedures, and orders are all accurate and complete.  Connery Shiffler G. [place], MD  [facility] [place] [facility].

## 2023-04-04 NOTE — Assessment & Plan Note (Signed)
 Problem seems to be better controlled, following with endocrinologist. Last hemoglobin A1c was 6.5 in 02/2023.

## 2023-04-04 NOTE — Assessment & Plan Note (Signed)
 Reporting problem as stable.  Last follow-up visit earlier this month. Following with nephrologist.

## 2023-04-06 ENCOUNTER — Other Ambulatory Visit: Payer: Self-pay | Admitting: Neurology

## 2023-04-07 ENCOUNTER — Encounter: Payer: Medicare Other | Admitting: Psychology

## 2023-04-07 NOTE — Telephone Encounter (Signed)
 disregard

## 2023-04-21 DIAGNOSIS — H2513 Age-related nuclear cataract, bilateral: Secondary | ICD-10-CM | POA: Diagnosis not present

## 2023-04-24 ENCOUNTER — Other Ambulatory Visit: Payer: Self-pay | Admitting: Family Medicine

## 2023-04-24 DIAGNOSIS — I10 Essential (primary) hypertension: Secondary | ICD-10-CM

## 2023-04-26 ENCOUNTER — Telehealth: Payer: Self-pay

## 2023-04-26 DIAGNOSIS — N1832 Chronic kidney disease, stage 3b: Secondary | ICD-10-CM

## 2023-04-26 DIAGNOSIS — I10 Essential (primary) hypertension: Secondary | ICD-10-CM

## 2023-04-26 DIAGNOSIS — G4719 Other hypersomnia: Secondary | ICD-10-CM

## 2023-04-26 DIAGNOSIS — F0283 Dementia in other diseases classified elsewhere, unspecified severity, with mood disturbance: Secondary | ICD-10-CM

## 2023-04-26 NOTE — Telephone Encounter (Signed)
 Copied from CRM [id number]. Topic: Clinical - Request for Lab/Test Order >> Apr 26, 2023  3:28 PM Florestine Avers wrote: Reason for CRM: Patients wife called in requesting that her husband get a full blood work up. She said that things are changing with the patient and really wants him to get some labs done. She said she is willing to pay out of pocket, patients wife is requesting a call back.

## 2023-04-26 NOTE — Telephone Encounter (Signed)
 I called and spoke with pt's wife. Pt's wife is requesting blood work to be done due to pt still sleeping all the time. She is aware that it could be because of the Alzheimer's but the family is starting to worry about other possible causes. She is requesting for TSH, CMP, B12, and Iron panel just to rule them out and to also give peace of mind to the other family members. She is okay with paying out of pocket if the tests are not covered by insurance. I pended the labs for you to sign off on if you're okay with it.

## 2023-04-26 NOTE — Addendum Note (Signed)
 Addended by: Kathreen Devoid on: 04/26/2023 04:23 PM   Modules accepted: Orders

## 2023-04-27 ENCOUNTER — Telehealth: Payer: Self-pay

## 2023-04-27 NOTE — Telephone Encounter (Signed)
 Note faxed to insurance, will place 2 sensors for pick up for pt for when he comes for his lab work.

## 2023-04-27 NOTE — Telephone Encounter (Signed)
 He has had labs done at his neurologist's office. If his wife wants labs ordered, they can be done as requested. She could ask his neurologist for a letter with Dx and prognosis, which she can provide to family members that are questioning his hx of dementia. Thanks, BJ

## 2023-04-27 NOTE — Telephone Encounter (Signed)
 Copied from CRM [id number]. Topic: Clinical - Prescription Issue >> Apr 27, 2023  9:32 AM Pascal Lux wrote: Reason for CRM: Steele Sizer from [facility] ([phone]) - called and said that Medicare Part B needs supporting documents before patient can receive prescription of Continuous Glucose Sensor (DEXCOM G7 SENSOR) MISC [[id number]].  Insurance Fax: [phone]  -- Family of patient is requesting a sample of prescription until this is resolved.

## 2023-04-27 NOTE — Telephone Encounter (Signed)
 I called and spoke with pt's wife. She will bring him for labs on Friday and will also pick up the 2 Dexcom sensor samples that I have for them to use until insurance approves his Rx.

## 2023-04-27 NOTE — Addendum Note (Signed)
 Addended by: Weyman Croon E on: 04/27/2023 12:53 PM   Modules accepted: Orders

## 2023-04-29 ENCOUNTER — Other Ambulatory Visit (INDEPENDENT_AMBULATORY_CARE_PROVIDER_SITE_OTHER)

## 2023-04-29 DIAGNOSIS — G309 Alzheimer's disease, unspecified: Secondary | ICD-10-CM | POA: Diagnosis not present

## 2023-04-29 DIAGNOSIS — F0283 Dementia in other diseases classified elsewhere, unspecified severity, with mood disturbance: Secondary | ICD-10-CM

## 2023-04-29 DIAGNOSIS — I10 Essential (primary) hypertension: Secondary | ICD-10-CM

## 2023-04-29 DIAGNOSIS — N1832 Chronic kidney disease, stage 3b: Secondary | ICD-10-CM | POA: Diagnosis not present

## 2023-04-29 DIAGNOSIS — G4719 Other hypersomnia: Secondary | ICD-10-CM | POA: Diagnosis not present

## 2023-04-29 DIAGNOSIS — E1122 Type 2 diabetes mellitus with diabetic chronic kidney disease: Secondary | ICD-10-CM

## 2023-04-29 LAB — COMPREHENSIVE METABOLIC PANEL
ALT: 38 U/L (ref 0–53)
AST: 31 U/L (ref 0–37)
Albumin: 3.9 g/dL (ref 3.5–5.2)
Alkaline Phosphatase: 90 U/L (ref 39–117)
BUN: 26 mg/dL — ABNORMAL HIGH (ref 6–23)
CO2: 31 meq/L (ref 19–32)
Calcium: 8.9 mg/dL (ref 8.4–10.5)
Chloride: 102 meq/L (ref 96–112)
Creatinine, Ser: 1.98 mg/dL — ABNORMAL HIGH (ref 0.40–1.50)
GFR: 31.84 mL/min — ABNORMAL LOW (ref >60.00)
Glucose, Bld: 205 mg/dL — ABNORMAL HIGH (ref 70–99)
Potassium: 4.4 meq/L (ref 3.5–5.1)
Sodium: 140 meq/L (ref 135–145)
Total Bilirubin: 0.5 mg/dL (ref 0.2–1.2)
Total Protein: 6.5 g/dL (ref 6.0–8.3)

## 2023-04-29 LAB — IBC PANEL
Iron: 136 ug/dL (ref 42–165)
Saturation Ratios: 56.8 % — ABNORMAL HIGH (ref 20.0–50.0)
TIBC: 239.4 ug/dL — ABNORMAL LOW (ref 250.0–450.0)
Transferrin: 171 mg/dL — ABNORMAL LOW (ref 212.0–360.0)

## 2023-04-29 LAB — VITAMIN B12: Vitamin B-12: 421 pg/mL (ref 211–911)

## 2023-04-29 LAB — TSH: TSH: 2.48 u[IU]/mL (ref 0.35–5.50)

## 2023-05-03 ENCOUNTER — Encounter: Payer: Self-pay | Admitting: Family Medicine

## 2023-06-13 DIAGNOSIS — N1832 Chronic kidney disease, stage 3b: Secondary | ICD-10-CM | POA: Diagnosis not present

## 2023-06-18 ENCOUNTER — Other Ambulatory Visit: Payer: Self-pay | Admitting: Gastroenterology

## 2023-06-20 DIAGNOSIS — G309 Alzheimer's disease, unspecified: Secondary | ICD-10-CM | POA: Diagnosis not present

## 2023-06-20 DIAGNOSIS — N1832 Chronic kidney disease, stage 3b: Secondary | ICD-10-CM | POA: Diagnosis not present

## 2023-06-20 DIAGNOSIS — E1122 Type 2 diabetes mellitus with diabetic chronic kidney disease: Secondary | ICD-10-CM | POA: Diagnosis not present

## 2023-06-20 DIAGNOSIS — I129 Hypertensive chronic kidney disease with stage 1 through stage 4 chronic kidney disease, or unspecified chronic kidney disease: Secondary | ICD-10-CM | POA: Diagnosis not present

## 2023-06-20 DIAGNOSIS — F0280 Dementia in other diseases classified elsewhere without behavioral disturbance: Secondary | ICD-10-CM | POA: Diagnosis not present

## 2023-06-29 ENCOUNTER — Other Ambulatory Visit: Payer: Self-pay | Admitting: Gastroenterology

## 2023-07-18 ENCOUNTER — Ambulatory Visit: Payer: Self-pay

## 2023-07-18 ENCOUNTER — Ambulatory Visit (INDEPENDENT_AMBULATORY_CARE_PROVIDER_SITE_OTHER)

## 2023-07-18 ENCOUNTER — Encounter: Payer: Self-pay | Admitting: Family Medicine

## 2023-07-18 ENCOUNTER — Ambulatory Visit (INDEPENDENT_AMBULATORY_CARE_PROVIDER_SITE_OTHER): Admitting: Family Medicine

## 2023-07-18 ENCOUNTER — Ambulatory Visit: Admitting: Family Medicine

## 2023-07-18 VITALS — BP 120/60 | HR 76 | Temp 96.6°F | Resp 16 | Ht 65.0 in | Wt 161.0 lb

## 2023-07-18 DIAGNOSIS — R300 Dysuria: Secondary | ICD-10-CM | POA: Diagnosis not present

## 2023-07-18 DIAGNOSIS — M25512 Pain in left shoulder: Secondary | ICD-10-CM

## 2023-07-18 DIAGNOSIS — N390 Urinary tract infection, site not specified: Secondary | ICD-10-CM | POA: Diagnosis not present

## 2023-07-18 DIAGNOSIS — W19XXXA Unspecified fall, initial encounter: Secondary | ICD-10-CM | POA: Diagnosis not present

## 2023-07-18 LAB — POC URINALSYSI DIPSTICK (AUTOMATED)
Bilirubin, UA: POSITIVE
Blood, UA: POSITIVE
Glucose, UA: POSITIVE — AB
Ketones, UA: POSITIVE
Nitrite, UA: POSITIVE
Protein, UA: POSITIVE — AB
Spec Grav, UA: 1.015 (ref 1.010–1.025)
Urobilinogen, UA: 2 U/dL — AB
pH, UA: 5 (ref 5.0–8.0)

## 2023-07-18 MED ORDER — SULFAMETHOXAZOLE-TRIMETHOPRIM 800-160 MG PO TABS: 1.0000 | ORAL_TABLET | Freq: Two times a day (BID) | ORAL | 0 refills | Status: AC

## 2023-07-18 NOTE — Telephone Encounter (Signed)
 FYI Only or Action Required?: FYI only for provider  Patient was last seen in primary care on 04/04/2023 by [place], [name], MD. Called Nurse Triage reporting suspected UTI/pain while urinating. Symptoms began several days ago. Interventions attempted: OTC medications: Azo. Symptoms are: gradually worsening.  Triage Disposition: See Physician Within 24 Hours  Patient/caregiver understands and will follow disposition?: Yes       Copied from CRM [id number]. Topic: Clinical - Red Word Triage >> Jul 18, 2023  8:19 AM Juluis Ok wrote: [facility] that prompted transfer to Nurse Triage: pain with urination Reason for Disposition  Urinating more frequently than usual (i.e., frequency)    Also having a pain across chest ONLY while urinating  Answer Assessment - Initial Assessment Questions 1. SYMPTOM: "What's the main symptom you're concerned about?" (e.g., frequency, incontinence)     Burning with urination 2. ONSET: "When did the  symptoms  start?"     Thursday 3. PAIN: "Is there any pain?" If Yes, ask: "How bad is it?" (Scale: 1-10; mild, moderate, severe)     Pain while urinating is 7 4. CAUSE: "What do you think is causing the symptoms?"     Feels similar to UTI 5. OTHER SYMPTOMS: "Do you have any other symptoms?" (e.g., blood in urine, fever, flank pain, pain with urination)     Pain across chest ONLY while urinating.  Protocols used: Urinary Symptoms-A-AH

## 2023-07-18 NOTE — Progress Notes (Signed)
 ACUTE VISIT Chief Complaint  Patient presents with   Dysuria    Started last week, has been taking AZO.    HPI: [name] is a 78 y.o. male with a PMHx significant for HTN, atherosclerosis of aorta, PUD, DM II, alzheimer's dementia, OA, CKD III, prostate cancer, iron deficiency anemia, and insomnia, among some, who is here today with his wife complaining of dysuria as described above.  Patient complains of burning with urination for about a week with associated urinary frequency.  He has had urinary symptoms and abnormal urinary cultures in 09/2022 and 03/2023.   Dysuria  This is a new problem. The current episode started in the past 7 days. The problem has been unchanged. The quality of the pain is described as burning. The pain is mild. There has been no fever. There is No history of pyelonephritis. Pertinent negatives include no chills, discharge, flank pain, hesitancy, nausea, sweats, urgency or vomiting.   CKD III and DM II: He is on Farxiga  10 mg daily.  S/P total proctectomy. Pertinent negatives include fever, chills, nausea, changes in appetite,or abdominal pain.   Fall/shoulder pain:  He also complains of some left shoulder pain after a fall yesterday while rushing out of the bathroom for a phone call, forgot to pull pants up and tripped. He also hit his chin. No head trauma. Denies any headache.   Review of Systems  Constitutional:  Negative for chills and fever.  HENT:  Negative for sore throat.   Respiratory:  Negative for cough, shortness of breath and wheezing.   Gastrointestinal:  Negative for nausea and vomiting.  Genitourinary:  Positive for dysuria. Negative for flank pain, hesitancy and urgency.  Musculoskeletal:  Positive for arthralgias. Negative for neck pain.  Skin:  Negative for rash.  Neurological:  Negative for syncope, facial asymmetry and weakness.  See other pertinent positives and negatives in HPI.  Current Outpatient Medications on File  Prior to Visit  Medication Sig Dispense Refill   amLODipine  (NORVASC ) 2.5 MG tablet TAKE 1 TABLET BY MOUTH EVERY DAY 90 tablet 2   atorvastatin  (LIPITOR) 40 MG tablet Take 1 tablet (40 mg total) by mouth daily. 90 tablet 3   BD PEN NEEDLE NANO 2ND GEN 32G X 4 MM MISC daily.     calcium  carbonate (TUMS - DOSED IN MG ELEMENTAL CALCIUM ) 500 MG chewable tablet Chew 2 tablets by mouth daily as needed for indigestion or heartburn.     Cholecalciferol (VITAMIN D3) 1000 units CAPS Take 2,000 Units by mouth daily. 1 capsule 0   citalopram  (CELEXA ) 10 MG tablet TAKE 1 TABLET BY MOUTH EVERY DAY 90 tablet 2   Coenzyme Q10 (COQ10) 100 MG CAPS Take 100 mg by mouth daily.     Continuous Glucose Receiver (DEXCOM G7 RECEIVER) DEVI use to monitor blood sugar for 365 days     Continuous Glucose Sensor (DEXCOM G7 SENSOR) MISC Use to check sugar continuously.Dx:E11.9 3 each 3   donepezil  (ARICEPT ) 10 MG tablet Take 1 tablet (10 mg total) by mouth at bedtime. 90 tablet 3   FARXIGA  10 MG TABS tablet Take 10 mg by mouth in the morning.     ferrous gluconate  (FERGON) 324 MG tablet TAKE 1 TABLET BY MOUTH DAILY WITH BREAKFAST 30 tablet 0   Insulin  Glargine (BASAGLAR  KWIKPEN) 100 UNIT/ML 10 units (titrate as directed up to 50 units per day) Subcutaneous once a day for 30 days     losartan -hydrochlorothiazide  (HYZAAR) 100-25 MG tablet Take  1 tablet by mouth daily.     Melatonin 12 MG TABS Take 5 mg by mouth at bedtime.     metoprolol  succinate (TOPROL -XL) 50 MG 24 hr tablet TAKE 1 TABLET DAILY, WITH  OR IMMEDIATELY FOLLOWING A MEAL 90 tablet 3   NON FORMULARY Take 2 capsules by mouth See admin instructions. Vicks ZzzQuil Pure Zzzs Triple Action Gummies- Chew 2 gummies by mouth every night at bedtime     OZEMPIC, 0.25 OR 0.5 MG/DOSE, 2 MG/3ML SOPN Inject into the skin once a week.     pantoprazole  (PROTONIX ) 20 MG tablet Take 20 mg by mouth daily.     pantoprazole  (PROTONIX ) 40 MG tablet TAKE 1 TABLET BY MOUTH EVERY DAY 30  tablet 0   TYLENOL  500 MG tablet Take 1,000 mg by mouth every 6 (six) hours as needed for headache.     No current facility-administered medications on file prior to visit.   Past Medical History:  Diagnosis Date   Acute blood loss anemia 06/27/2012   Anemia    Arthritis    CKD (chronic kidney disease)    Constipation due to pain medication    needs stool softner while on pain medication   Diabetes mellitus    Duodenal ulcer 08/13/2015   2014  - Dr Kaplan/GI   Duodenal ulcer with hemorrhage 08/13/2015   2014  - Dr Kaplan/GI   Dyspnea 06/19/2012   GERD (gastroesophageal reflux disease)    H/O: osteoarthritis    Heart murmur    History of kidney stones    History of prostate cancer    History of urethral stricture    Hypercholesterolemia    Hypertension    Melena 06/19/2012   PONV (postoperative nausea and vomiting)    Prostate cancer (HCC)    prostate   S/P TAVR (transcatheter aortic valve replacement) 08/12/2020   s/p TAVR with a 29 mm Edwards Sapien 3 THV via the TF approach by Dr. Arlester Ladd and Dr. Sherene Dilling   Severe aortic stenosis    Spinal stenosis    Ulcer duodenal hemorrhage    Allergies  Allergen Reactions   Aspirin  Other (See Comments)    Bleeding ulcer   Diclofenac  Other (See Comments)    Bleeding ulcer   Excedrin Tension Headache [Acetaminophen -Caffeine] Other (See Comments)    Bleeding ulcer   Motrin [Ibuprofen] Other (See Comments)    Bleeding ulcer    Social History   Socioeconomic History   Marital status: Married    Spouse name: Not on file   Number of children: 2   Years of education: 16   Highest education level: Bachelor's degree (e.g., BA, AB, BS)  Occupational History   Occupation: Retired  Tobacco Use   Smoking status: Former    Current packs/day: 0.00    Average packs/day: 1.5 packs/day for 20.0 years (30.0 ttl pk-yrs)    Types: Cigarettes    Start date: 02/09/1960    Quit date: 02/09/1980    Years since quitting: 43.4   Smokeless  tobacco: Never  Vaping Use   Vaping status: Never Used  Substance and Sexual Activity   Alcohol use: No   Drug use: No   Sexual activity: Not Currently  Other Topics Concern   Not on file  Social History Narrative   Retired from Engineer, structural (boats).    Lives with wife,    two children in [place].    Enjoys reading, boating, travelling to [place]  in the winter with wife.   Social  Drivers of Health   Financial Resource Strain: Low Risk  (12/24/2021)   Overall Financial Resource Strain (CARDIA)    Difficulty of Paying Living Expenses: Not hard at all  Food Insecurity: No Food Insecurity (04/21/2022)   Hunger Vital Sign    Worried About Running Out of Food in the Last Year: Never true    Ran Out of Food in the Last Year: Never true  Transportation Needs: No Transportation Needs (04/21/2022)   PRAPARE - Administrator, Civil Service (Medical): No    Lack of Transportation (Non-Medical): No  Physical Activity: Inactive (12/24/2021)   Exercise Vital Sign    Days of Exercise per Week: 0 days    Minutes of Exercise per Session: 0 min  Stress: No Stress Concern Present (12/24/2021)   [employer] of Occupational Health - Occupational Stress Questionnaire    Feeling of Stress : Not at all  Social Connections: Moderately Isolated (12/24/2021)   Social Connection and Isolation Panel [NHANES]    Frequency of Communication with Friends and Family: More than three times a week    Frequency of Social Gatherings with Friends and Family: More than three times a week    Attends Religious Services: Never    Database administrator or Organizations: No    Attends Banker Meetings: Never    Marital Status: Married   Vitals:   07/18/23 1544  BP: 120/60  Pulse: 76  Resp: 16  Temp: (!) 96.6 F (35.9 C)  SpO2: 96%   Body mass index is 26.79 kg/m.  Physical Exam Vitals and nursing note reviewed.  Constitutional:      General: He is not in acute distress.     Appearance: He is well-developed.  HENT:     Head: Normocephalic and atraumatic.     Mouth/Throat:     Mouth: Mucous membranes are moist.  Eyes:     Conjunctiva/sclera: Conjunctivae normal.  Cardiovascular:     Rate and Rhythm: Normal rate and regular rhythm.     Heart sounds: No murmur heard. Pulmonary:     Effort: Pulmonary effort is normal. No respiratory distress.     Breath sounds: Normal breath sounds.  Abdominal:     Palpations: Abdomen is soft. There is no mass.     Tenderness: There is no abdominal tenderness. There is no right CVA tenderness or left CVA tenderness.  Musculoskeletal:     Left shoulder: Tenderness (anterior shoulder with palpation and ROM) present. No deformity. Decreased range of motion.     Comments: No tenderness with palpation along clavicle and no deformities appreciated.  Skin:    General: Skin is warm.     Findings: No erythema or rash.  Neurological:     Mental Status: He is alert. Mental status is at baseline.     Comments: Stable gait, not assisted.  Psychiatric:        Mood and Affect: Mood and affect normal.   ASSESSMENT AND PLAN:  Mr. Brunner was seen today for dysuria and shoulder pain after a fall.   Acute pain of left shoulder Started right after a fall yesterday. ?  Rotator cuff tendinitis. OA may be a contributing factor. Shoulder x-ray ordered today. PT will be arranged.  -     DG Shoulder Left; Future -     Ambulatory referral to Physical Therapy  Dysuria He does seem like probably has been recovering for the past few months. He is on Farxiga , we discussed  son side effects. Urine dipstick positive for nitrite, blood, and 3+ leukocytes. We will follow urine culture.  -     POCT Urinalysis Dipstick (Automated) -     Urine Culture; Future  Urinary tract infection without hematuria, site unspecified Empiric treatment with Bactrim  DS twice daily (dosed based on last EGFR) for 5 days. Continue adequate  hydration. Monitor for new symptoms. Instructed about warning signs. Treatment will be tailored according to urine culture and sensitivity results.  -     Sulfamethoxazole -Trimethoprim ; Take 1 tablet by mouth 2 (two) times daily for 5 days.  Dispense: 10 tablet; Refill: 0  Fall, initial encounter Fall precautions discussed.  Return if symptoms worsen or fail to improve, for keep next appointment.  I, Fritz Jewel Wierda, acting as a scribe for Ojani Berenson [place], MD., have documented all relevant documentation on the behalf of Kyrene Longan [place], MD, as directed by  Alfreddie Consalvo [place], MD while in the presence of Dontea Corlew [place], MD.   I, Sandralee Tarkington [place], MD, have reviewed all documentation for this visit. The documentation on 07/18/23 for the exam, diagnosis, procedures, and orders are all accurate and complete.  Fielding Mault G. [place], MD  [facility]. [place] office.

## 2023-07-18 NOTE — Patient Instructions (Addendum)
 A few things to remember from today's visit:  Dysuria - Plan: POCT Urinalysis Dipstick (Automated), Culture, Urine, Culture, Urine  Acute pain of left shoulder - Plan: DG Shoulder Left  Urinary tract infection without hematuria, site unspecified - Plan: sulfamethoxazole -trimethoprim  (BACTRIM  DS) 800-160 MG tablet  If problem continues may need to stop Farxiga .  If you need refills for medications you take chronically, please call your pharmacy. Do not use My Chart to request refills or for acute issues that need immediate attention. If you send a my chart message, it may take a few days to be addressed, specially if I am not in the office.  Please be sure medication list is accurate. If a new problem present, please set up appointment sooner than planned today.

## 2023-07-19 ENCOUNTER — Ambulatory Visit: Payer: Self-pay | Admitting: Family Medicine

## 2023-07-20 ENCOUNTER — Other Ambulatory Visit: Payer: Self-pay | Admitting: Gastroenterology

## 2023-07-20 LAB — URINE CULTURE
MICRO NUMBER:: 16556231
SPECIMEN QUALITY:: ADEQUATE

## 2023-08-10 ENCOUNTER — Other Ambulatory Visit: Payer: Self-pay | Admitting: Gastroenterology

## 2023-08-31 ENCOUNTER — Other Ambulatory Visit: Payer: Self-pay | Admitting: Family Medicine

## 2023-08-31 DIAGNOSIS — E1122 Type 2 diabetes mellitus with diabetic chronic kidney disease: Secondary | ICD-10-CM

## 2023-09-01 DIAGNOSIS — Z85828 Personal history of other malignant neoplasm of skin: Secondary | ICD-10-CM | POA: Diagnosis not present

## 2023-09-01 DIAGNOSIS — C44321 Squamous cell carcinoma of skin of nose: Secondary | ICD-10-CM | POA: Diagnosis not present

## 2023-09-01 DIAGNOSIS — D225 Melanocytic nevi of trunk: Secondary | ICD-10-CM | POA: Diagnosis not present

## 2023-09-01 DIAGNOSIS — L814 Other melanin hyperpigmentation: Secondary | ICD-10-CM | POA: Diagnosis not present

## 2023-09-01 DIAGNOSIS — L570 Actinic keratosis: Secondary | ICD-10-CM | POA: Diagnosis not present

## 2023-09-01 DIAGNOSIS — L821 Other seborrheic keratosis: Secondary | ICD-10-CM | POA: Diagnosis not present

## 2023-09-05 ENCOUNTER — Other Ambulatory Visit: Payer: Self-pay | Admitting: Gastroenterology

## 2023-09-05 DIAGNOSIS — Z794 Long term (current) use of insulin: Secondary | ICD-10-CM | POA: Diagnosis not present

## 2023-09-05 DIAGNOSIS — Z8639 Personal history of other endocrine, nutritional and metabolic disease: Secondary | ICD-10-CM | POA: Diagnosis not present

## 2023-09-05 DIAGNOSIS — E1122 Type 2 diabetes mellitus with diabetic chronic kidney disease: Secondary | ICD-10-CM | POA: Diagnosis not present

## 2023-09-05 DIAGNOSIS — N1832 Chronic kidney disease, stage 3b: Secondary | ICD-10-CM | POA: Diagnosis not present

## 2023-09-07 ENCOUNTER — Other Ambulatory Visit: Payer: Self-pay | Admitting: Gastroenterology

## 2023-09-16 ENCOUNTER — Ambulatory Visit: Payer: Self-pay | Admitting: *Deleted

## 2023-09-16 ENCOUNTER — Ambulatory Visit: Admitting: Family Medicine

## 2023-09-16 NOTE — Telephone Encounter (Signed)
 Scheduled for 09/19/2023 at 2:30 with Dr. Mercer.   Is it possible he can be seen sooner?   Any provider is fine.   He is not wanting to go to the urgent care.        FYI Only or Action Required?: FYI only for provider.  Patient was last seen in primary care on 07/18/2023 by [place], [name], MD.  Called Nurse Triage reporting Dysuria.Burning with urination after inserting a urinary catheter and meeting resistance.  He forced it in.  Inserted Tues or Wed.  It's a in and out catherization.   He has to do this every so often due to scar tissue to keep himself open.   See triage notes for details.  Symptoms began several days ago. Burning started in the last day or two  Interventions attempted: OTC medications: taking AZO for the burning.  Symptoms are: gradually worsening.  Triage Disposition: See PCP Within 2 Weeks  Patient/caregiver understands and will follow disposition?: Yes

## 2023-09-16 NOTE — Telephone Encounter (Signed)
 Copied from CRM [id number]. Topic: Clinical - Red Word Triage >> Sep 16, 2023 10:01 AM Carlyon D wrote: Red Word that prompted transfer to Nurse Triage: Pt wife calling husband has possible UTI severe Pain and burning  when urinating. Reason for Disposition  All other urine symptoms  Answer Assessment - Initial Assessment Questions 1. SYMPTOM: What's the main symptom you're concerned about? (e.g., frequency, incontinence)     Daryl Webb calling in, wife.  He is having burning with urination.   He did a catherization either Tues. Or Wed. This week.     He has scar tissue so he has to insert a urinary catheter every so often to keep himself open.    He had not done a catheter in a while.   He forced the catheter in and now he is having burning with urination.  He had resistance during insertion and he forced it in.   I think he sustained some trauma when he inserted the catheter.       He has Alzheimer's.   He is not good at showering regularly as he should.  That could also be some of the problem.  I suggested to him today about going to the urgent care but he is refusing.   Doesn't want to go wait there.      We live at the [facility]   I've threatened to have a nurses's aid come in and help him get his shower.   He just doesn't want to do it is the problem.   He showered yesterday.      Dr. [place] was thinking the [place] was ding this but I really think it's trauma from him forcing the catheter in.   He had a UTI a couple of months ago.    He is taking AZO to help with the burning.   He thinks that is supposed to cure the UTI.   I've told him it does not cure the UTI just helps with the burning. 2. ONSET: When did the  burning  start?     He did the catheter Tues. Or Wed.   The burning started in the last day or two. 3. PAIN: Is there any pain? If Yes, ask: How bad is it? (Scale: 1-10; mild, moderate, severe)     Yes 4. CAUSE: What do you think is causing the symptoms?      Possible trauma from the catheter.  I don't think it's from the Farxiga .   Dr. [place] thought his UTI was from that but this time I believe it's from the catheter insertion and the trauma he sustained. 5. OTHER SYMPTOMS: Do you have any other symptoms? (e.g., blood in urine, fever, flank pain, pain with urination)     No blood in urine.    No flank pain  No fever.    Is c/o pain in his left side. 6. PREGNANCY: Is there any chance you are pregnant? When was your last menstrual period?     N/A  Answer Assessment - Initial Assessment Questions 1. SYMPTOMS: What symptoms are you concerned about?     See triage notes for UTI symptoms. Wife calling in.    He is there with her.    He inserted a urinary catheter on Tues. Or Wed. And he had to force it in because he met resistance.   Now he has developed burning with urination over the last day or two.  He has scar tissue so he  has to insert a urinary catheter every so often to keep himself open.   He has not done it in a while so encountered resistance when he inserted the catheter.   He forced it in.   I think the burning is a result of trauma from the catheter insertion. 2. ONSET:  When did the symptoms start?     Burning started in the last day or two. 3. FEVER: Do you have a fever? If Yes, ask: What is the temperature, how was it measured, and when did it start?     No 4. ABDOMEN PAIN: Is there any abdomen pain? (Scale 1-10; or mild, moderate, severe)     C/o left sided pain 5. URINE COLOR: What color is the urine?  Is there blood present in the urine? (e.g., clear, yellow, cloudy, tea-colored, blood streaks, bright red)     No blood in urine.  Voiding ok but it burns. He has Alzheimer's so he doesn't shower as often as he should so that could be contributing to the problem too.  Dr. [place] thought he might be from the Farxiga  the last time he had a UTI but I don't believe this time it's the Farxiga .   It's from the catheter  trauma. 6. URINE AMOUNT: When did you last empty the urine from the collection bag? How much urine was in the bag at that time? How much urine is in the collection bag now?     N/A      straight cath in and out 7. INSERTION: How long have you (they) had the catheter?     He has to insert a catheter every so often due to scar tissue to keep himself open.   8. OTHER SYMPTOMS: Are there any other symptoms? (e.g., abdomen swelling, back pain, bladder spasms, constipation, foul smelling urine, leaking of urine)      Denies blood in urine, denies flank pain.  9. MEDICINES: Are you taking any medicines to treat urinary problems? (e.g., antibiotics for a urinary tract infection, medicines to treat bladder spasms)      He is taking AZO for the burning. 10. PREGNANCY: Is there any chance you are pregnant? When was your last menstrual period?       N/A  Protocols used: Urinary Symptoms-A-AH, Urinary Catheter (e.g., Foley) Symptoms and Questions-A-AH

## 2023-09-16 NOTE — Telephone Encounter (Signed)
 Had an opening open at 72, but patient and wife won't be able to get here in time. Did advise that was all we had until Monday, but would reach out again if I see something open up.

## 2023-09-19 ENCOUNTER — Ambulatory Visit (INDEPENDENT_AMBULATORY_CARE_PROVIDER_SITE_OTHER): Admitting: Family Medicine

## 2023-09-19 ENCOUNTER — Encounter: Payer: Self-pay | Admitting: Family Medicine

## 2023-09-19 VITALS — BP 100/64 | HR 76 | Temp 98.7°F | Ht 65.0 in | Wt 160.4 lb

## 2023-09-19 DIAGNOSIS — R300 Dysuria: Secondary | ICD-10-CM | POA: Diagnosis not present

## 2023-09-19 DIAGNOSIS — N3001 Acute cystitis with hematuria: Secondary | ICD-10-CM

## 2023-09-19 DIAGNOSIS — R350 Frequency of micturition: Secondary | ICD-10-CM | POA: Diagnosis not present

## 2023-09-19 DIAGNOSIS — R1011 Right upper quadrant pain: Secondary | ICD-10-CM | POA: Diagnosis not present

## 2023-09-19 DIAGNOSIS — R8290 Unspecified abnormal findings in urine: Secondary | ICD-10-CM | POA: Diagnosis not present

## 2023-09-19 LAB — POCT URINALYSIS DIPSTICK
Bilirubin, UA: POSITIVE
Blood, UA: POSITIVE
Glucose, UA: POSITIVE — AB
Ketones, UA: POSITIVE
Nitrite, UA: POSITIVE
Protein, UA: POSITIVE — AB
Spec Grav, UA: 1.015 (ref 1.010–1.025)
Urobilinogen, UA: 2 U/dL — AB
pH, UA: 5 (ref 5.0–8.0)

## 2023-09-19 MED ORDER — AMOXICILLIN-POT CLAVULANATE 500-125 MG PO TABS: 1.0000 | ORAL_TABLET | Freq: Two times a day (BID) | ORAL | 0 refills | Status: DC

## 2023-09-19 NOTE — Patient Instructions (Signed)
 We will get labs.  I have included information about gallstones for you to review.  If labs are abnormal we may need to proceed with a right upper quadrant ultrasound to assess the gallbladder.

## 2023-09-19 NOTE — Progress Notes (Signed)
 Established Patient Office Visit   Subjective  Patient ID: Daryl Webb, male    DOB: Jul 21, 1945  Age: 78 y.o. MRN: [id number]  Chief Complaint  Patient presents with   Acute Visit    Patient came in today for UTI symptoms, Burning, frequency, cloudy, chest pain while urinating, started 5 days ago   Pt accompanied by his wife.  Patient is a 78 year old male followed by Dr. Swaziland and seen for acute concern.  Patient endorses urinary frequency, burning, cloudy urine x 5 days.  Also with abdominal pain, decreased appetite and loose stools.  Taking OTC AZO without improvement in symptoms.  Typically drinks 1-2 bottles of water per day, diet Sprite and coffee.  Patient has a history of kidney stones but denies similar pain.     Patient Active Problem List   Diagnosis Date Noted   History of iron deficiency anemia 05/29/2022   History of duodenal ulcer 05/29/2022   Symptomatic anemia 11/29/2021   Alzheimer's dementia with mood disturbance (HCC) 11/21/2021   GI bleeding 11/20/2021   Insomnia 04/22/2021   S/P TAVR (transcatheter aortic valve replacement) 08/12/2020   Severe aortic stenosis    Atherosclerosis of aorta (HCC) 05/19/2020   Stage 3b chronic kidney disease (HCC) 07/03/2017   Flank pain 02/12/2016   Essential hypertension 02/12/2016   Dyslipidemia (high LDL; low HDL) 01/19/2016   PUD (peptic ulcer disease) 01/19/2016   Vertigo 05/02/2014   Low back pain 07/20/2012   Type 2 diabetes mellitus with renal manifestations (HCC) 09/01/2011   History of prostate cancer    History of urethral stricture    Generalized osteoarthritis of multiple sites    Past Medical History:  Diagnosis Date   Acute blood loss anemia 06/27/2012   Anemia    Arthritis    Blood transfusion without reported diagnosis    CKD (chronic kidney disease)    Clotting disorder (HCC) 11/20/21   Bleeding ulcer   Constipation due to pain medication    needs stool softner while on pain medication    Diabetes mellitus    Duodenal ulcer 08/13/2015   2014  - Dr Kaplan/GI   Duodenal ulcer with hemorrhage 08/13/2015   2014  - Dr Kaplan/GI   Dyspnea 06/19/2012   GERD (gastroesophageal reflux disease)    H/O: osteoarthritis    Heart murmur    History of kidney stones    History of prostate cancer    History of urethral stricture    Hypercholesterolemia    Hypertension    Melena 06/19/2012   PONV (postoperative nausea and vomiting)    Prostate cancer (HCC)    prostate   S/P TAVR (transcatheter aortic valve replacement) 08/12/2020   s/p TAVR with a 29 mm Edwards Sapien 3 THV via the TF approach by Dr. Wonda and Dr. Lucas   Severe aortic stenosis    Spinal stenosis    Ulcer duodenal hemorrhage    Past Surgical History:  Procedure Laterality Date   ARTHROPLASTY  2004   right knee Dr. Melodi SANES SURGERY     BIOPSY  11/21/2021   Procedure: BIOPSY;  Surgeon: Legrand Victory LITTIE DOUGLAS, MD;  Location: [facility];  Service: Gastroenterology;;   CARDIAC CATHETERIZATION     CARDIAC VALVE REPLACEMENT     CARPAL TUNNEL RELEASE     right hand,wrist Dr. Leonor   CYSTOSCOPY N/A 09/20/2012   Procedure: PHYLLIS;  Surgeon: Noretta Ferrara, MD;  Location: [facility];  Service: Urology;  Laterality: N/A;  ESOPHAGOGASTRODUODENOSCOPY N/A 11/21/2021   Procedure: ESOPHAGOGASTRODUODENOSCOPY (EGD);  Surgeon: Legrand Victory LITTIE DOUGLAS, MD;  Location: [facility];  Service: Gastroenterology;  Laterality: N/A;   ESOPHAGOGASTRODUODENOSCOPY (EGD) WITH PROPOFOL  N/A 11/30/2021   Procedure: ESOPHAGOGASTRODUODENOSCOPY (EGD) WITH PROPOFOL ;  Surgeon: Albertus Gordy HERO, MD;  Location: [facility];  Service: Gastroenterology;  Laterality: N/A;   HEMOSTASIS CLIP PLACEMENT  11/30/2021   Procedure: HEMOSTASIS CLIP PLACEMENT;  Surgeon: Albertus Gordy HERO, MD;  Location: [facility];  Service: Gastroenterology;;   INGUINAL HERNIA REPAIR Right 01/14/2022   Procedure: RIGHT OPEN INGUINAL HERNIA REPAIR WITH MESH;  Surgeon:  Kinsinger, Herlene Righter, MD;  Location: [facility];  Service: General;  Laterality: Right;   JOINT REPLACEMENT Right    KNEE ARTHROSCOPY Right    LUMBAR LAMINECTOMY  02/08/2013   PROSTATECTOMY     radical   RIGHT/LEFT HEART CATH AND CORONARY ANGIOGRAPHY N/A 05/08/2020   Procedure: RIGHT/LEFT HEART CATH AND CORONARY ANGIOGRAPHY;  Surgeon: Wonda Sharper, MD;  Location: [facility] INVASIVE CV LAB;  Service: Cardiovascular;  Laterality: N/A;   SPINE SURGERY     TEE WITHOUT CARDIOVERSION N/A 08/12/2020   Procedure: TRANSESOPHAGEAL ECHOCARDIOGRAM (TEE);  Surgeon: Wonda Sharper, MD;  Location: [facility] INVASIVE CV LAB;  Service: Open Heart Surgery;  Laterality: N/A;   TOOTH EXTRACTION  12/2021   TRANSCATHETER AORTIC VALVE REPLACEMENT, TRANSFEMORAL N/A 08/12/2020   Procedure: TRANSCATHETER AORTIC VALVE REPLACEMENT, TRANSFEMORAL;  Surgeon: Wonda Sharper, MD;  Location: [facility] INVASIVE CV LAB;  Service: Open Heart Surgery;  Laterality: N/A;   VASECTOMY  1985   Social History   Tobacco Use   Smoking status: Former    Current packs/day: 0.00    Average packs/day: 1.5 packs/day for 20.0 years (30.0 ttl pk-yrs)    Types: Cigarettes    Start date: 02/09/1960    Quit date: 02/09/1980    Years since quitting: 43.6   Smokeless tobacco: Never  Vaping Use   Vaping status: Never Used  Substance Use Topics   Alcohol use: No   Drug use: No   Family History  Problem Relation Age of Onset   Heart disease Mother    Heart failure Mother    Heart disease Father    Diabetes Father    Colon cancer Neg Hx    Esophageal cancer Neg Hx    Inflammatory bowel disease Neg Hx    Liver disease Neg Hx    Pancreatic cancer Neg Hx    Rectal cancer Neg Hx    Stomach cancer Neg Hx    Allergies  Allergen Reactions   Aspirin  Other (See Comments)    Bleeding ulcer   Diclofenac  Other (See Comments)    Bleeding ulcer   Excedrin Tension Headache [Acetaminophen -Caffeine] Other (See Comments)    Bleeding ulcer   Motrin [Ibuprofen]  Other (See Comments)    Bleeding ulcer    ROS Negative unless stated above    Objective:     BP 100/64 (BP Location: Left Arm, Patient Position: Sitting, Cuff Size: Normal)   Pulse 76   Temp 98.7 F (37.1 C) (Oral)   Ht 5' 5 (1.651 m)   Wt 160 lb 6.4 oz (72.8 kg)   SpO2 96%   BMI 26.69 kg/m  BP Readings from Last 3 Encounters:  09/19/23 100/64  07/18/23 120/60  04/04/23 120/70   Wt Readings from Last 3 Encounters:  09/19/23 160 lb 6.4 oz (72.8 kg)  07/18/23 161 lb (73 kg)  04/04/23 171 lb 4 oz (77.7 kg)  Physical Exam Constitutional:      Appearance: Normal appearance.  HENT:     Head: Normocephalic and atraumatic.     Nose: Nose normal.  Cardiovascular:     Rate and Rhythm: Normal rate.  Pulmonary:     Effort: Pulmonary effort is normal.  Abdominal:     General: Bowel sounds are normal.     Palpations: Abdomen is soft.     Tenderness: There is abdominal tenderness in the right upper quadrant and right lower quadrant. There is no right CVA tenderness or left CVA tenderness.  Skin:    General: Skin is warm and dry.  Neurological:     Mental Status: He is alert and oriented to person, place, and time.     Results for orders placed or performed in visit on 09/19/23  POCT urinalysis dipstick  Result Value Ref Range   Color, UA red    Clarity, UA cloudy    Glucose, UA Positive (A) Negative   Bilirubin, UA pos    Ketones, UA pos    Spec Grav, UA 1.015 1.010 - 1.025   Blood, UA pos    pH, UA 5.0 5.0 - 8.0   Protein, UA Positive (A) Negative   Urobilinogen, UA 2.0 (A) 0.2 or 1.0 E.U./dL   Nitrite, UA pos    Leukocytes, UA Large (3+) (A) Negative   Appearance     Odor        Assessment & Plan:   Acute cystitis with hematuria -     Amoxicillin -Pot Clavulanate; Take 1 tablet by mouth in the morning and at bedtime for 7 days.  Dispense: 14 tablet; Refill: 0  Dysuria -     Urine Culture; Future -     POCT urinalysis dipstick  Abnormal urine  sediment -     Urine Culture; Future  Urinary frequency  RUQ abdominal pain -     Comprehensive metabolic panel with GFR; Future -     Lipase; Future -     CBC with Differential/Platelet; Future  Acute urinary symptoms.  POC UA abnormal.  Obtain UCX.  Start ABX.  Increase p.o. intake of water and fluids.  Given strict precautions.  Concern for gallbladder disease given RUQ tenderness on exam.  Patient thinks he might have been having RUQ pain after eating.  Obtain labs.  Consider RUQ ultrasound based on results.  Follow-up with PCP  Return if symptoms worsen or fail to improve.   Clotilda JONELLE Single, MD

## 2023-09-20 LAB — CBC WITH DIFFERENTIAL/PLATELET
Basophils Absolute: 0 K/uL (ref 0.0–0.1)
Basophils Relative: 0.3 % (ref 0.0–3.0)
Eosinophils Absolute: 0.1 K/uL (ref 0.0–0.7)
Eosinophils Relative: 0.6 % (ref 0.0–5.0)
HCT: 42.6 % (ref 39.0–52.0)
Hemoglobin: 13.9 g/dL (ref 13.0–17.0)
Lymphocytes Relative: 10.1 % — ABNORMAL LOW (ref 12.0–46.0)
Lymphs Abs: 1.7 K/uL (ref 0.7–4.0)
MCHC: 32.6 g/dL (ref 30.0–36.0)
MCV: 102.7 fl — ABNORMAL HIGH (ref 78.0–100.0)
Monocytes Absolute: 1.4 K/uL — ABNORMAL HIGH (ref 0.1–1.0)
Monocytes Relative: 8.6 % (ref 3.0–12.0)
Neutro Abs: 13.2 K/uL — ABNORMAL HIGH (ref 1.4–7.7)
Neutrophils Relative %: 80.4 % — ABNORMAL HIGH (ref 43.0–77.0)
Platelets: 191 K/uL (ref 150.0–400.0)
RBC: 4.15 Mil/uL — ABNORMAL LOW (ref 4.22–5.81)
RDW: 14.5 % (ref 11.5–15.5)
WBC: 16.4 K/uL — ABNORMAL HIGH (ref 4.0–10.5)

## 2023-09-20 LAB — COMPREHENSIVE METABOLIC PANEL WITH GFR
ALT: 22 U/L (ref 0–53)
AST: 18 U/L (ref 0–37)
Albumin: 3.8 g/dL (ref 3.5–5.2)
Alkaline Phosphatase: 80 U/L (ref 39–117)
BUN: 33 mg/dL — ABNORMAL HIGH (ref 6–23)
CO2: 28 meq/L (ref 19–32)
Calcium: 9.1 mg/dL (ref 8.4–10.5)
Chloride: 99 meq/L (ref 96–112)
Creatinine, Ser: 2.23 mg/dL — ABNORMAL HIGH (ref 0.40–1.50)
GFR: 27.53 mL/min — ABNORMAL LOW (ref >60.00)
Glucose, Bld: 134 mg/dL — ABNORMAL HIGH (ref 70–99)
Potassium: 4.3 meq/L (ref 3.5–5.1)
Sodium: 135 meq/L (ref 135–145)
Total Bilirubin: 0.9 mg/dL (ref 0.2–1.2)
Total Protein: 7 g/dL (ref 6.0–8.3)

## 2023-09-20 LAB — LIPASE: Lipase: 22 U/L (ref 11.0–59.0)

## 2023-09-21 ENCOUNTER — Ambulatory Visit: Payer: Self-pay | Admitting: Family Medicine

## 2023-09-21 ENCOUNTER — Encounter (HOSPITAL_COMMUNITY): Payer: Self-pay

## 2023-09-21 ENCOUNTER — Inpatient Hospital Stay (HOSPITAL_COMMUNITY)
Admission: EM | Admit: 2023-09-21 | Discharge: 2023-09-24 | DRG: 699 | Disposition: A | Discharge status: Home / Self Care | Attending: Internal Medicine | Admitting: Internal Medicine

## 2023-09-21 ENCOUNTER — Other Ambulatory Visit: Payer: Self-pay

## 2023-09-21 ENCOUNTER — Emergency Department (HOSPITAL_COMMUNITY)

## 2023-09-21 DIAGNOSIS — D696 Thrombocytopenia, unspecified: Secondary | ICD-10-CM | POA: Diagnosis present

## 2023-09-21 DIAGNOSIS — T83511A Infection and inflammatory reaction due to indwelling urethral catheter, initial encounter: Principal | ICD-10-CM | POA: Diagnosis present

## 2023-09-21 DIAGNOSIS — I350 Nonrheumatic aortic (valve) stenosis: Secondary | ICD-10-CM | POA: Diagnosis not present

## 2023-09-21 DIAGNOSIS — Z8249 Family history of ischemic heart disease and other diseases of the circulatory system: Secondary | ICD-10-CM

## 2023-09-21 DIAGNOSIS — K429 Umbilical hernia without obstruction or gangrene: Secondary | ICD-10-CM | POA: Diagnosis not present

## 2023-09-21 DIAGNOSIS — R531 Weakness: Secondary | ICD-10-CM | POA: Diagnosis not present

## 2023-09-21 DIAGNOSIS — Z87891 Personal history of nicotine dependence: Secondary | ICD-10-CM

## 2023-09-21 DIAGNOSIS — E876 Hypokalemia: Secondary | ICD-10-CM | POA: Diagnosis present

## 2023-09-21 DIAGNOSIS — Z8546 Personal history of malignant neoplasm of prostate: Secondary | ICD-10-CM | POA: Diagnosis not present

## 2023-09-21 DIAGNOSIS — F32A Depression, unspecified: Secondary | ICD-10-CM | POA: Diagnosis present

## 2023-09-21 DIAGNOSIS — I129 Hypertensive chronic kidney disease with stage 1 through stage 4 chronic kidney disease, or unspecified chronic kidney disease: Secondary | ICD-10-CM | POA: Diagnosis present

## 2023-09-21 DIAGNOSIS — E1122 Type 2 diabetes mellitus with diabetic chronic kidney disease: Secondary | ICD-10-CM | POA: Diagnosis present

## 2023-09-21 DIAGNOSIS — Z7985 Long-term (current) use of injectable non-insulin antidiabetic drugs: Secondary | ICD-10-CM | POA: Diagnosis not present

## 2023-09-21 DIAGNOSIS — R339 Retention of urine, unspecified: Secondary | ICD-10-CM | POA: Insufficient documentation

## 2023-09-21 DIAGNOSIS — Z79899 Other long term (current) drug therapy: Secondary | ICD-10-CM

## 2023-09-21 DIAGNOSIS — E7800 Pure hypercholesterolemia, unspecified: Secondary | ICD-10-CM | POA: Diagnosis present

## 2023-09-21 DIAGNOSIS — N1832 Chronic kidney disease, stage 3b: Secondary | ICD-10-CM | POA: Diagnosis present

## 2023-09-21 DIAGNOSIS — A419 Sepsis, unspecified organism: Secondary | ICD-10-CM | POA: Diagnosis not present

## 2023-09-21 DIAGNOSIS — E871 Hypo-osmolality and hyponatremia: Secondary | ICD-10-CM | POA: Diagnosis present

## 2023-09-21 DIAGNOSIS — K219 Gastro-esophageal reflux disease without esophagitis: Secondary | ICD-10-CM | POA: Diagnosis present

## 2023-09-21 DIAGNOSIS — K5730 Diverticulosis of large intestine without perforation or abscess without bleeding: Secondary | ICD-10-CM | POA: Diagnosis not present

## 2023-09-21 DIAGNOSIS — Z794 Long term (current) use of insulin: Secondary | ICD-10-CM | POA: Diagnosis not present

## 2023-09-21 DIAGNOSIS — D539 Nutritional anemia, unspecified: Secondary | ICD-10-CM | POA: Diagnosis present

## 2023-09-21 DIAGNOSIS — N3000 Acute cystitis without hematuria: Secondary | ICD-10-CM

## 2023-09-21 DIAGNOSIS — N390 Urinary tract infection, site not specified: Principal | ICD-10-CM | POA: Diagnosis present

## 2023-09-21 DIAGNOSIS — N281 Cyst of kidney, acquired: Secondary | ICD-10-CM | POA: Diagnosis not present

## 2023-09-21 DIAGNOSIS — I10 Essential (primary) hypertension: Secondary | ICD-10-CM | POA: Diagnosis present

## 2023-09-21 DIAGNOSIS — E1129 Type 2 diabetes mellitus with other diabetic kidney complication: Secondary | ICD-10-CM | POA: Diagnosis present

## 2023-09-21 LAB — URINALYSIS, ROUTINE W REFLEX MICROSCOPIC
Bilirubin Urine: NEGATIVE
Glucose, UA: 150 mg/dL — AB
Hgb urine dipstick: NEGATIVE
Ketones, ur: NEGATIVE mg/dL
Leukocytes,Ua: NEGATIVE
Nitrite: POSITIVE — AB
Protein, ur: 100 mg/dL — AB
Specific Gravity, Urine: 1.02 (ref 1.005–1.030)
pH: 5 (ref 5.0–8.0)

## 2023-09-21 LAB — CBC
HCT: 40.6 % (ref 39.0–52.0)
Hemoglobin: 13.1 g/dL (ref 13.0–17.0)
MCH: 33.4 pg (ref 26.0–34.0)
MCHC: 32.3 g/dL (ref 30.0–36.0)
MCV: 103.6 fL — ABNORMAL HIGH (ref 80.0–100.0)
Platelets: 147 K/uL — ABNORMAL LOW (ref 150–400)
RBC: 3.92 MIL/uL — ABNORMAL LOW (ref 4.22–5.81)
RDW: 13.5 % (ref 11.5–15.5)
WBC: 16.2 K/uL — ABNORMAL HIGH (ref 4.0–10.5)
nRBC: 0 % (ref 0.0–0.2)

## 2023-09-21 LAB — BASIC METABOLIC PANEL WITH GFR
Anion gap: 12 (ref 5–15)
BUN: 45 mg/dL — ABNORMAL HIGH (ref 8–23)
CO2: 22 mmol/L (ref 22–32)
Calcium: 8.8 mg/dL — ABNORMAL LOW (ref 8.9–10.3)
Chloride: 99 mmol/L (ref 98–111)
Creatinine, Ser: 2.08 mg/dL — ABNORMAL HIGH (ref 0.61–1.24)
GFR, Estimated: 32 mL/min — ABNORMAL LOW (ref >60)
Glucose, Bld: 134 mg/dL — ABNORMAL HIGH (ref 70–99)
Potassium: 3.3 mmol/L — ABNORMAL LOW (ref 3.5–5.1)
Sodium: 133 mmol/L — ABNORMAL LOW (ref 135–145)

## 2023-09-21 LAB — LACTIC ACID, PLASMA
Lactic Acid, Venous: 0.8 mmol/L (ref 0.5–1.9)
Lactic Acid, Venous: 1.2 mmol/L (ref 0.5–1.9)

## 2023-09-21 LAB — GLUCOSE, CAPILLARY
Glucose-Capillary: 81 mg/dL (ref 70–99)
Glucose-Capillary: 116 mg/dL — ABNORMAL HIGH (ref 70–99)

## 2023-09-21 MED ORDER — ALBUTEROL SULFATE (2.5 MG/3ML) 0.083% IN NEBU: 2.5000 mg | INHALATION_SOLUTION | RESPIRATORY_TRACT | Status: DC | PRN

## 2023-09-21 MED ORDER — TRAZODONE HCL 50 MG PO TABS
25.0000 mg | ORAL_TABLET | Freq: Every evening | ORAL | Status: DC | PRN
  Administered 2023-09-21 – 2023-09-23 (×3): 25 mg via ORAL
  Filled 2023-09-21 (×2): qty 1

## 2023-09-21 MED ORDER — TRAMADOL HCL 50 MG PO TABS
50.0000 mg | ORAL_TABLET | Freq: Three times a day (TID) | ORAL | Status: DC | PRN
  Administered 2023-09-22: 50 mg via ORAL
  Filled 2023-09-21: qty 1

## 2023-09-21 MED ORDER — INSULIN ASPART 100 UNIT/ML IJ SOLN
0.0000 [IU] | Freq: Three times a day (TID) | INTRAMUSCULAR | Status: DC
  Administered 2023-09-22 – 2023-09-23 (×2): 2 [IU] via SUBCUTANEOUS
  Administered 2023-09-24: 3 [IU] via SUBCUTANEOUS
  Filled 2023-09-21: qty 0.15

## 2023-09-21 MED ORDER — ONDANSETRON HCL 4 MG PO TABS: 4.0000 mg | ORAL_TABLET | Freq: Four times a day (QID) | ORAL | Status: DC | PRN

## 2023-09-21 MED ORDER — MORPHINE SULFATE (PF) 4 MG/ML IV SOLN
4.0000 mg | Freq: Once | INTRAVENOUS | Status: AC
  Administered 2023-09-21 (×2): 4 mg via INTRAVENOUS
  Filled 2023-09-21: qty 1

## 2023-09-21 MED ORDER — LACTATED RINGERS IV BOLUS
1000.0000 mL | Freq: Once | INTRAVENOUS | Status: AC
  Administered 2023-09-21 (×2): 1000 mL via INTRAVENOUS

## 2023-09-21 MED ORDER — SODIUM CHLORIDE 0.9 % IV SOLN: INTRAVENOUS | Status: DC

## 2023-09-21 MED ORDER — ONDANSETRON HCL 4 MG/2ML IJ SOLN: 4.0000 mg | Freq: Four times a day (QID) | INTRAMUSCULAR | Status: DC | PRN

## 2023-09-21 MED ORDER — SODIUM CHLORIDE 0.9 % IV SOLN
1.0000 g | INTRAVENOUS | Status: DC
  Administered 2023-09-22: 1 g via INTRAVENOUS
  Filled 2023-09-21: qty 10

## 2023-09-21 MED ORDER — PANTOPRAZOLE SODIUM 40 MG PO TBEC
40.0000 mg | DELAYED_RELEASE_TABLET | Freq: Every day | ORAL | Status: DC
Start: 2023-09-21 — End: 2023-09-24
  Administered 2023-09-21 – 2023-09-24 (×5): 40 mg via ORAL
  Filled 2023-09-21 (×4): qty 1

## 2023-09-21 MED ORDER — INSULIN ASPART 100 UNIT/ML IJ SOLN
0.0000 [IU] | Freq: Every day | INTRAMUSCULAR | Status: DC
  Filled 2023-09-21: qty 0.05

## 2023-09-21 MED ORDER — MELATONIN 5 MG PO TABS
5.0000 mg | ORAL_TABLET | Freq: Once | ORAL | Status: AC
  Administered 2023-09-21 (×2): 5 mg via ORAL
  Filled 2023-09-21: qty 1

## 2023-09-21 MED ORDER — SODIUM CHLORIDE 0.9 % IV SOLN
1.0000 g | Freq: Once | INTRAVENOUS | Status: AC
  Administered 2023-09-21 (×2): 1 g via INTRAVENOUS
  Filled 2023-09-21: qty 10

## 2023-09-21 MED ORDER — BASAGLAR KWIKPEN 100 UNIT/ML ~~LOC~~ SOPN: 25.0000 [IU] | PEN_INJECTOR | SUBCUTANEOUS | Status: DC

## 2023-09-21 MED ORDER — HEPARIN SODIUM (PORCINE) 5000 UNIT/ML IJ SOLN
5000.0000 [IU] | Freq: Three times a day (TID) | INTRAMUSCULAR | Status: DC
  Administered 2023-09-21 – 2023-09-24 (×9): 5000 [IU] via SUBCUTANEOUS
  Filled 2023-09-21 (×8): qty 1

## 2023-09-21 MED ORDER — ONDANSETRON HCL 4 MG/2ML IJ SOLN
4.0000 mg | Freq: Once | INTRAMUSCULAR | Status: AC
  Administered 2023-09-21 (×2): 4 mg via INTRAVENOUS
  Filled 2023-09-21: qty 2

## 2023-09-21 MED ORDER — INSULIN GLARGINE-YFGN 100 UNIT/ML ~~LOC~~ SOLN
25.0000 [IU] | Freq: Every day | SUBCUTANEOUS | Status: DC
  Administered 2023-09-22 – 2023-09-23 (×4): 25 [IU] via SUBCUTANEOUS
  Filled 2023-09-21 (×4): qty 0.25

## 2023-09-21 MED ORDER — CITALOPRAM HYDROBROMIDE 10 MG PO TABS
10.0000 mg | ORAL_TABLET | Freq: Every day | ORAL | Status: DC
Start: 2023-09-22 — End: 2023-09-24
  Administered 2023-09-22 – 2023-09-24 (×3): 10 mg via ORAL
  Filled 2023-09-21 (×3): qty 1

## 2023-09-21 NOTE — ED Triage Notes (Signed)
 BIBA from home.  UTI for diagnosed on Tuesday, has been taking amoxicillin  since.  Still reports burning upon urination and frequent urination, pain at 7-8/10 during urination.  Reports headache for 2-3 days, pain 6/10.  No control over urination starting this morning

## 2023-09-21 NOTE — Plan of Care (Signed)

## 2023-09-21 NOTE — ED Provider Notes (Signed)
 [place] EMERGENCY DEPARTMENT AT [facility] Provider Note   CSN: [id number] Arrival date & time: 09/21/23  1051     Patient presents with: Urinary Frequency and Dysuria (UTI diagnosed on 09/20/23)   Daryl Webb is a 78 y.o. male.    Urinary Frequency  Dysuria Presenting symptoms: dysuria   Associated symptoms: urinary frequency      Patient has a history of hypertension diabetes hypercholesterolemia, prostate cancer, kidney stones, aortic stenosis, spinal stenosis, acid reflux.  Patient states he started having urinary discomfort few days ago.  He noted he was having significant burning with urination.  It is painful for him to urinate.  He is not having trouble with fevers.  He has not had any vomiting or diarrhea.  Patient went to a doctor's office visit on the 11th.  Patient was started on Augmentin .  Patient states he has been taking the antibiotic but is not helping.  He also started to notice some blood in his urine today.  Prior to Admission medications   Medication Sig Start Date End Date Taking? Authorizing Provider  amLODipine  (NORVASC ) 2.5 MG tablet TAKE 1 TABLET BY MOUTH EVERY DAY 04/25/23   [place], [name], MD  amoxicillin -clavulanate (AUGMENTIN ) 500-125 MG tablet Take 1 tablet by mouth in the morning and at bedtime for 7 days. 09/19/23 09/26/23  Mercer Clotilda JONELLE, MD  atorvastatin  (LIPITOR) 40 MG tablet Take 1 tablet (40 mg total) by mouth daily. 12/15/22   Hilty, Vinie BROCKS, MD  BD PEN NEEDLE NANO 2ND GEN 32G X 4 MM MISC daily. 11/22/22   [provider]  calcium  carbonate (TUMS - DOSED IN MG ELEMENTAL CALCIUM ) 500 MG chewable tablet Chew 2 tablets by mouth daily as needed for indigestion or heartburn.    [provider]  Cholecalciferol (VITAMIN D3) 1000 units CAPS Take 2,000 Units by mouth daily. 01/14/22   Kinsinger, Herlene Righter, MD  citalopram  (CELEXA ) 10 MG tablet TAKE 1 TABLET BY MOUTH EVERY DAY 04/06/23   Gregg Lek, MD   Coenzyme Q10 (COQ10) 100 MG CAPS Take 100 mg by mouth daily.    [provider]  Continuous Glucose Receiver (DEXCOM G7 RECEIVER) DEVI use to monitor blood sugar for 365 days 08/27/22   [provider]  Continuous Glucose Sensor (DEXCOM G7 SENSOR) MISC USE TO CHECK SUGAR CONTINUOUSLY 08/31/23   [place], [name], MD  donepezil  (ARICEPT ) 10 MG tablet Take 1 tablet (10 mg total) by mouth at bedtime. 03/14/23 03/08/24  Gregg Lek, MD  FARXIGA  10 MG TABS tablet Take 10 mg by mouth in the morning.    [provider]  ferrous gluconate  (FERGON) 324 MG tablet TAKE 1 TABLET BY MOUTH EVERY DAY WITH BREAKFAST 09/08/23   Mansouraty, Gabriel Jr., MD  Insulin  Glargine (BASAGLAR  [facility]) 100 UNIT/ML 10 units (titrate as directed up to 50 units per day) Subcutaneous once a day for 30 days 03/02/22   [provider]  losartan -hydrochlorothiazide  (HYZAAR) 100-25 MG tablet Take 1 tablet by mouth daily. 02/26/22   [provider]  Melatonin 12 MG TABS Take 5 mg by mouth at bedtime.    [provider]  metoprolol  succinate (TOPROL -XL) 50 MG 24 hr tablet TAKE 1 TABLET DAILY, WITH  OR IMMEDIATELY FOLLOWING A MEAL 12/13/22   Hilty, Vinie BROCKS, MD  NON FORMULARY Take 2 capsules by mouth See admin instructions. Vicks ZzzQuil Pure Zzzs Triple Action Gummies- Chew 2 gummies by mouth every night at bedtime    [provider]  OZEMPIC, 0.25 OR 0.5 MG/DOSE, 2 MG/3ML SOPN Inject into the skin once a week.    [provider]  pantoprazole  (PROTONIX ) 20 MG tablet Take 20 mg by mouth daily.    [provider]  pantoprazole  (PROTONIX ) 40 MG tablet TAKE 1 TABLET BY MOUTH EVERY DAY 08/10/23   Mansouraty, Gabriel Jr., MD  TYLENOL  500 MG tablet Take 1,000 mg by mouth every 6 (six) hours as needed for headache.    [provider]    Allergies: Aspirin , Diclofenac , Excedrin tension headache [acetaminophen -caffeine], and Motrin [ibuprofen]    Review of  Systems  Genitourinary:  Positive for dysuria and frequency.    Updated Vital Signs BP (!) 91/56   Pulse 66   Temp (!) 97.4 F (36.3 C) (Oral)   Resp 15   Ht 1.651 m (5' 5)   Wt 72.8 kg   SpO2 95%   BMI 26.69 kg/m   Physical Exam Vitals and nursing note reviewed.  Constitutional:      General: He is not in acute distress.    Appearance: He is well-developed.  HENT:     Head: Normocephalic and atraumatic.     Right Ear: External ear normal.     Left Ear: External ear normal.  Eyes:     General: No scleral icterus.       Right eye: No discharge.        Left eye: No discharge.     Conjunctiva/sclera: Conjunctivae normal.  Neck:     Trachea: No tracheal deviation.  Cardiovascular:     Rate and Rhythm: Normal rate and regular rhythm.  Pulmonary:     Effort: Pulmonary effort is normal. No respiratory distress.     Breath sounds: Normal breath sounds. No stridor. No wheezing or rales.  Abdominal:     General: Bowel sounds are normal. There is no distension.     Palpations: Abdomen is soft.     Tenderness: There is abdominal tenderness in the suprapubic area. There is no right CVA tenderness, left CVA tenderness, guarding or rebound.  Musculoskeletal:        General: No tenderness or deformity.     Cervical back: Neck supple.  Skin:    General: Skin is warm and dry.     Findings: No rash.  Neurological:     General: No focal deficit present.     Mental Status: He is alert.     Cranial Nerves: No cranial nerve deficit, dysarthria or facial asymmetry.     Sensory: No sensory deficit.     Motor: No abnormal muscle tone or seizure activity.     Coordination: Coordination normal.  Psychiatric:        Mood and Affect: Mood normal.     (all labs ordered are listed, but only abnormal results are displayed) Labs Reviewed  URINALYSIS, ROUTINE W REFLEX MICROSCOPIC - Abnormal; Notable for the following components:      Result Value   Color, Urine RED (*)    Glucose, UA  150 (*)    Protein, ur 100 (*)    Nitrite POSITIVE (*)    Bacteria, UA RARE (*)    All other components within normal limits  CBC - Abnormal; Notable for the following components:   WBC 16.2 (*)    RBC 3.92 (*)    MCV 103.6 (*)    Platelets 147 (*)    All other components within normal limits  BASIC METABOLIC PANEL WITH GFR -  Abnormal; Notable for the following components:   Sodium 133 (*)    Potassium 3.3 (*)    Glucose, Bld 134 (*)    BUN 45 (*)    Creatinine, Ser 2.08 (*)    Calcium  8.8 (*)    GFR, Estimated 32 (*)    All other components within normal limits  URINE CULTURE  CULTURE, BLOOD (ROUTINE X 2)  CULTURE, BLOOD (ROUTINE X 2)  LACTIC ACID, PLASMA  LACTIC ACID, PLASMA    EKG: None  Radiology: CT Renal Stone Study Result Date: 09/21/2023 CLINICAL DATA:  Abdominal pain stone suspected, history of prostate cancer status post prostatectomy left iliac EXAM: CT ABDOMEN AND PELVIS WITHOUT CONTRAST TECHNIQUE: Multidetector CT imaging of the abdomen and pelvis was performed following the standard protocol without IV contrast. RADIATION DOSE REDUCTION: This exam was performed according to the departmental dose-optimization program which includes automated exposure control, adjustment of the mA and/or kV according to patient size and/or use of iterative reconstruction technique. COMPARISON:  None Available. FINDINGS: Limited evaluation of metastatic disease given the lack of contrast. Lower chest: Status post TAVR. No suspicious nodule. Normal heart size. Atherosclerotic calcifications of coronary arteries. Hepatobiliary: No focal liver abnormality is seen. No gallstones, gallbladder wall thickening, or biliary dilatation. Pancreas: Unremarkable. No pancreatic ductal dilatation or surrounding inflammatory changes. Spleen: Normal in size without focal abnormality. Adrenals/Urinary Tract: Adrenal glands are unremarkable. Right kidney hypodense cortical lesions up to 2.8 cm which does not  require imaging follow-up, likely representing simple cortical cysts. Left kidney hypodense cortical lesion measuring 5 mm likely a hyperdense cyst containing proteinaceous material/blood products. No nephrolithiasis or hydronephrosis. Kidneys are normal, without renal calculi, focal lesion, or hydronephrosis. Bladder is unremarkable. Stomach/Bowel: Stomach is within normal limits. Appendix appears normal. Diverticulosis without diverticulitis. No evidence of bowel wall thickening, distention, or inflammatory changes. Vascular/Lymphatic: No enlarged abdominal or pelvic lymph nodes.Atherosclerotic calcifications of aorta Reproductive:  Surgical clips from prior prostatectomy. Other: Tiny fat containing umbilical hernia. No abdominopelvic ascites. Musculoskeletal: Left iliac bone sclerotic lesion measuring 2.4 x 1 cm stable to 2017. Multilevel degenerative changes of the spine. Posterior spinal fusion device in lower lumbar spine. IMPRESSION: No hydronephrosis or nephrolithiasis. Bilateral simple and hyperdense renal cortical cysts which does not require imaging follow-up. Prior prostatectomy.  No pelvic mass or lymphadenopathy. Colonic diverticulosis without diverticulitis. Atherosclerotic calcifications of aorta and coronary arteries. Stable sclerotic lesion of left iliac bone. Electronically Signed   By: Megan  Zare M.D.   On: 09/21/2023 15:25     Procedures   Medications Ordered in the ED  lactated ringers  bolus 1,000 mL (has no administration in time range)  cefTRIAXone  (ROCEPHIN ) 1 g in sodium chloride  0.9 % 100 mL IVPB (0 g Intravenous Stopped 09/21/23 1238)  morphine  (PF) 4 MG/ML injection 4 mg (4 mg Intravenous Given 09/21/23 1208)  ondansetron  (ZOFRAN ) injection 4 mg (4 mg Intravenous Given 09/21/23 1207)    Clinical Course as of 09/22/23 1421  Wed Sep 21, 2023  1252 Urinalysis, Routine w reflex microscopic -Urine, Clean Catch(!) Urinalysis suggestive of UTI [JK]  1347 Basic metabolic  panel(!) Creatinine elevated but decreased compared to previous.  White blood cell count elevated [JK]  1529 CT scan does not show any acute abnormality.  No hydronephrosis or nephrolithiasis. [JK]  1531 Patient's repeat blood pressure is decreased 91/56.  Will [JK]  1532  add on lactic acid level proceed with fluid bolus.  I will consult medical service for admission [JK]  1552 Case discussed  with Dr Roxane [JK]    Clinical Course User Index [JK] Randol Simmonds, MD                                 Medical Decision Making Problems Addressed: Lower urinary tract infectious disease: acute illness or injury that poses a threat to life or bodily functions  Amount and/or Complexity of Data Reviewed Labs: ordered. Decision-making details documented in ED Course. Radiology: ordered and independent interpretation performed.  Risk Prescription drug management.   Patient presented to the ED with complaints of dysuria.  Patient recently tried as an outpatient.  Symptoms however were not improving.  Concerned about the possibility of complicated UTI, ureterolithiasis.  Patient's initial vitals did not show any signs of hypotension.  However while the patient was here repeat blood pressures have been downward trending.  Now 91/56.  Patient CT scan does not show any signs of kidney stones but he does have definite UTI leukocytosis.  Patient was initially treated with IV antibiotics for his UTI.  However with the low blood pressure we will add on lactic acid level given fluid bolus and I will consult the medical service for admission considering his age and comorbidities     Final diagnoses:  Lower urinary tract infectious disease    ED Discharge Orders     None          Randol Simmonds, MD 09/22/23 1421

## 2023-09-21 NOTE — Group Note (Deleted)
 Date:  09/21/2023 Time:  2:22 PM  Group Topic/Focus:  Wellness Toolbox:   The focus of this group is to discuss various aspects of wellness, balancing those aspects and exploring ways to increase the ability to experience wellness.  Patients will create a wellness toolbox for use upon discharge.     Participation Level:  {BHH PARTICIPATION OZCZO:77735}  Participation Quality:  {BHH PARTICIPATION QUALITY:22265}  Affect:  {BHH AFFECT:22266}  Cognitive:  {BHH COGNITIVE:22267}  Insight: {BHH Insight2:20797}  Engagement in Group:  {BHH ENGAGEMENT IN HMNLE:77731}  Modes of Intervention:  {BHH MODES OF INTERVENTION:22269}  Additional Comments:  ***  Myra Curtistine BROCKS 09/21/2023, 2:22 PM

## 2023-09-21 NOTE — H&P (Signed)
 History and Physical  Daryl Webb FMW:[id number] DOB: August 10, 1945 DOA: 09/21/2023  PCP: [place], [name], MD   Chief Complaint: Dysuria, weakness  HPI: Daryl Webb is a 78 y.o. male with medical history significant for CKD stage III, insulin -dependent type 2 diabetes, history of prostate cancer who does intermittent self cath and is being admitted to the hospital for UTI which failed outpatient management.  He started having dysuria and frequent urination as well as generalized weakness about 6 days ago, finally went to his provider on 8/11.  Urinalysis was consistent with UTI he was started on amoxicillin .  He started taking it on the evening of 8/11, despite this he has continued dysuria, weakness.  He became incontinent of urine today for the first time.  He denies any fevers, chills, wife also notes that on the evening of 8/10, when he got up to use the bathroom at night he had a fall, he does not think he tripped over anything, but he also denies any dizziness or lightheadedness.  He does take antihypertensive medications at home, did not take any since yesterday, and despite that his blood pressure here in the emergency department is in the 90s systolic.  Workup in the ER as detailed below shows evidence of continued leukocytosis, stable CKD, and CT without acute findings.  He was started empirically on IV antibiotics, and hospitalist admission was requested.  Review of Systems: Please see HPI for pertinent positives and negatives. A complete 10 system review of systems are otherwise negative.  Past Medical History:  Diagnosis Date   Acute blood loss anemia 06/27/2012   Anemia    Arthritis    Blood transfusion without reported diagnosis    CKD (chronic kidney disease)    Clotting disorder (HCC) 11/20/21   Bleeding ulcer   Constipation due to pain medication    needs stool softner while on pain medication   Diabetes mellitus    Duodenal ulcer 08/13/2015   2014  - Dr  Kaplan/GI   Duodenal ulcer with hemorrhage 08/13/2015   2014  - Dr Kaplan/GI   Dyspnea 06/19/2012   GERD (gastroesophageal reflux disease)    H/O: osteoarthritis    Heart murmur    History of kidney stones    History of prostate cancer    History of urethral stricture    Hypercholesterolemia    Hypertension    Melena 06/19/2012   PONV (postoperative nausea and vomiting)    Prostate cancer (HCC)    prostate   S/P TAVR (transcatheter aortic valve replacement) 08/12/2020   s/p TAVR with a 29 mm Edwards Sapien 3 THV via the TF approach by Dr. Wonda and Dr. Lucas   Severe aortic stenosis    Spinal stenosis    Ulcer duodenal hemorrhage    Past Surgical History:  Procedure Laterality Date   ARTHROPLASTY  2004   right knee Dr. Melodi SANES SURGERY     BIOPSY  11/21/2021   Procedure: BIOPSY;  Surgeon: Legrand Victory LITTIE DOUGLAS, MD;  Location: [facility];  Service: Gastroenterology;;   CARDIAC CATHETERIZATION     CARDIAC VALVE REPLACEMENT     CARPAL TUNNEL RELEASE     right hand,wrist Dr. Leonor   CYSTOSCOPY N/A 09/20/2012   Procedure: PHYLLIS;  Surgeon: Noretta Ferrara, MD;  Location: [facility];  Service: Urology;  Laterality: N/A;   ESOPHAGOGASTRODUODENOSCOPY N/A 11/21/2021   Procedure: ESOPHAGOGASTRODUODENOSCOPY (EGD);  Surgeon: Legrand Victory LITTIE DOUGLAS, MD;  Location: [facility];  Service: Gastroenterology;  Laterality: N/A;   ESOPHAGOGASTRODUODENOSCOPY (EGD) WITH PROPOFOL  N/A 11/30/2021   Procedure: ESOPHAGOGASTRODUODENOSCOPY (EGD) WITH PROPOFOL ;  Surgeon: Albertus Gordy HERO, MD;  Location: [facility];  Service: Gastroenterology;  Laterality: N/A;   HEMOSTASIS CLIP PLACEMENT  11/30/2021   Procedure: HEMOSTASIS CLIP PLACEMENT;  Surgeon: Albertus Gordy HERO, MD;  Location: [facility];  Service: Gastroenterology;;   INGUINAL HERNIA REPAIR Right 01/14/2022   Procedure: RIGHT OPEN INGUINAL HERNIA REPAIR WITH MESH;  Surgeon: Kinsinger, Herlene Righter, MD;  Location: [facility];  Service: General;   Laterality: Right;   JOINT REPLACEMENT Right    KNEE ARTHROSCOPY Right    LUMBAR LAMINECTOMY  02/08/2013   PROSTATECTOMY     radical   RIGHT/LEFT HEART CATH AND CORONARY ANGIOGRAPHY N/A 05/08/2020   Procedure: RIGHT/LEFT HEART CATH AND CORONARY ANGIOGRAPHY;  Surgeon: Wonda Sharper, MD;  Location: [facility] CV LAB;  Service: Cardiovascular;  Laterality: N/A;   SPINE SURGERY     TEE WITHOUT CARDIOVERSION N/A 08/12/2020   Procedure: TRANSESOPHAGEAL ECHOCARDIOGRAM (TEE);  Surgeon: Wonda Sharper, MD;  Location: [facility]AS[facility] CV LAB;  Service: Open Heart Surgery;  Laterality: N/A;   TOOTH EXTRACTION  12/2021   TRANSCATHETER AORTIC VALVE REPLACEMENT, TRANSFEMORAL N/A 08/12/2020   Procedure: TRANSCATHETER AORTIC VALVE REPLACEMENT, TRANSFEMORAL;  Surgeon: Wonda Sharper, MD;  Location: [facility] INVASIVE CV LAB;  Service: Open Heart Surgery;  Laterality: N/A;   VASECTOMY  1985   Social History:  reports that he quit smoking about 43 years ago. His smoking use included cigarettes. He started smoking about 63 years ago. He has a 30 pack-year smoking history. He has never used smokeless tobacco. He reports that he does not drink alcohol and does not use drugs.  Allergies  Allergen Reactions   Aspirin  Other (See Comments)    Bleeding ulcer   Diclofenac  Other (See Comments)    Bleeding ulcer   Excedrin Tension Headache [Acetaminophen -Caffeine] Other (See Comments)    Bleeding ulcer   Motrin [Ibuprofen] Other (See Comments)    Bleeding ulcer    Family History  Problem Relation Age of Onset   Heart disease Mother    Heart failure Mother    Heart disease Father    Diabetes Father    Colon cancer Neg Hx    Esophageal cancer Neg Hx    Inflammatory bowel disease Neg Hx    Liver disease Neg Hx    Pancreatic cancer Neg Hx    Rectal cancer Neg Hx    Stomach cancer Neg Hx      Prior to Admission medications   Medication Sig Start Date End Date Taking? Authorizing Provider  amLODipine  (NORVASC )  2.5 MG tablet TAKE 1 TABLET BY MOUTH EVERY DAY 04/25/23   [place], [name], MD  amoxicillin -clavulanate (AUGMENTIN ) 500-125 MG tablet Take 1 tablet by mouth in the morning and at bedtime for 7 days. 09/19/23 09/26/23  Mercer Clotilda SAUNDERS, MD  atorvastatin  (LIPITOR) 40 MG tablet Take 1 tablet (40 mg total) by mouth daily. 12/15/22   Hilty, Vinie BROCKS, MD  BD PEN NEEDLE NANO 2ND GEN 32G X 4 MM MISC daily. 11/22/22   [provider]  calcium  carbonate (TUMS - DOSED IN MG ELEMENTAL CALCIUM ) 500 MG chewable tablet Chew 2 tablets by mouth daily as needed for indigestion or heartburn.    [provider]  Cholecalciferol (VITAMIN D3) 1000 units CAPS Take 2,000 Units by mouth daily. 01/14/22   Kinsinger, Herlene Righter, MD  citalopram  (CELEXA ) 10 MG tablet TAKE 1 TABLET BY MOUTH  EVERY DAY 04/06/23   Gregg Lek, MD  Coenzyme Q10 (COQ10) 100 MG CAPS Take 100 mg by mouth daily.    [provider]  Continuous Glucose Receiver (DEXCOM G7 RECEIVER) DEVI use to monitor blood sugar for 365 days 08/27/22   [provider]  Continuous Glucose Sensor (DEXCOM G7 SENSOR) MISC USE TO CHECK SUGAR CONTINUOUSLY 08/31/23   [place], [name], MD  donepezil  (ARICEPT ) 10 MG tablet Take 1 tablet (10 mg total) by mouth at bedtime. 03/14/23 03/08/24  Gregg Lek, MD  FARXIGA  10 MG TABS tablet Take 10 mg by mouth in the morning.    [provider]  ferrous gluconate  (FERGON) 324 MG tablet TAKE 1 TABLET BY MOUTH EVERY DAY WITH BREAKFAST 09/08/23   Mansouraty, Gabriel Jr., MD  Insulin  Glargine (BASAGLAR  [facility]) 100 UNIT/ML 10 units (titrate as directed up to 50 units per day) Subcutaneous once a day for 30 days 03/02/22   [provider]  losartan -hydrochlorothiazide  (HYZAAR) 100-25 MG tablet Take 1 tablet by mouth daily. 02/26/22   [provider]  Melatonin 12 MG TABS Take 5 mg by mouth at bedtime.    [provider]  metoprolol  succinate (TOPROL -XL) 50 MG 24 hr tablet TAKE 1  TABLET DAILY, WITH  OR IMMEDIATELY FOLLOWING A MEAL 12/13/22   Hilty, Vinie BROCKS, MD  NON FORMULARY Take 2 capsules by mouth See admin instructions. Vicks ZzzQuil Pure Zzzs Triple Action Gummies- Chew 2 gummies by mouth every night at bedtime    [provider]  OZEMPIC, 0.25 OR 0.5 MG/DOSE, 2 MG/3ML SOPN Inject into the skin once a week.    [provider]  pantoprazole  (PROTONIX ) 20 MG tablet Take 20 mg by mouth daily.    [provider]  pantoprazole  (PROTONIX ) 40 MG tablet TAKE 1 TABLET BY MOUTH EVERY DAY 08/10/23   Mansouraty, Gabriel Jr., MD  TYLENOL  500 MG tablet Take 1,000 mg by mouth every 6 (six) hours as needed for headache.    [provider]    Physical Exam: BP (!) 91/56   Pulse 66   Temp (!) 97.4 F (36.3 C) (Oral)   Resp 15   Ht 5' 5 (1.651 m)   Wt 72.8 kg   SpO2 95%   BMI 26.69 kg/m  General:  Alert, oriented, calm, in no acute distress, pleasant and cooperative.  He looks nontoxic.  His wife is at the bedside and assists in providing the history. Cardiovascular: RRR, no murmurs or rubs, no peripheral edema  Respiratory: clear to auscultation bilaterally, no wheezes, no crackles  Abdomen: soft, nontender, nondistended, normal bowel tones heard  Skin: dry, no rashes  Musculoskeletal: no joint effusions, normal range of motion  Psychiatric: appropriate affect, normal speech  Neurologic: extraocular muscles intact, clear speech, moving all extremities with intact sensorium         Labs on Admission:  Basic Metabolic Panel: Recent Labs  Lab 09/19/23 1511 09/21/23 1159  NA 135 133*  K 4.3 3.3*  CL 99 99  CO2 28 22  GLUCOSE 134* 134*  BUN 33* 45*  CREATININE 2.23* 2.08*  CALCIUM  9.1 8.8*   Liver Function Tests: Recent Labs  Lab 09/19/23 1511  AST 18  ALT 22  ALKPHOS 80  BILITOT 0.9  PROT 7.0  ALBUMIN  3.8   Recent Labs  Lab 09/19/23 1511  LIPASE 22.0   No results for input(s): AMMONIA in the last 168  hours. CBC: Recent Labs  Lab 09/19/23 1511 09/21/23 1159  WBC 16.4* 16.2*  NEUTROABS 13.2*  --   HGB 13.9 13.1  HCT 42.6 40.6  MCV 102.7* 103.6*  PLT 191.0 147*   Cardiac Enzymes: No results for input(s): CKTOTAL, CKMB, CKMBINDEX, TROPONINI in the last 168 hours. BNP (last 3 results) No results for input(s): BNP in the last 8760 hours.  ProBNP (last 3 results) No results for input(s): PROBNP in the last 8760 hours.  CBG: No results for input(s): GLUCAP in the last 168 hours.  Radiological Exams on Admission: CT Renal Stone Study Result Date: 09/21/2023 CLINICAL DATA:  Abdominal pain stone suspected, history of prostate cancer status post prostatectomy left iliac EXAM: CT ABDOMEN AND PELVIS WITHOUT CONTRAST TECHNIQUE: Multidetector CT imaging of the abdomen and pelvis was performed following the standard protocol without IV contrast. RADIATION DOSE REDUCTION: This exam was performed according to the departmental dose-optimization program which includes automated exposure control, adjustment of the mA and/or kV according to patient size and/or use of iterative reconstruction technique. COMPARISON:  None Available. FINDINGS: Limited evaluation of metastatic disease given the lack of contrast. Lower chest: Status post TAVR. No suspicious nodule. Normal heart size. Atherosclerotic calcifications of coronary arteries. Hepatobiliary: No focal liver abnormality is seen. No gallstones, gallbladder wall thickening, or biliary dilatation. Pancreas: Unremarkable. No pancreatic ductal dilatation or surrounding inflammatory changes. Spleen: Normal in size without focal abnormality. Adrenals/Urinary Tract: Adrenal glands are unremarkable. Right kidney hypodense cortical lesions up to 2.8 cm which does not require imaging follow-up, likely representing simple cortical cysts. Left kidney hypodense cortical lesion measuring 5 mm likely a hyperdense cyst containing proteinaceous material/blood  products. No nephrolithiasis or hydronephrosis. Kidneys are normal, without renal calculi, focal lesion, or hydronephrosis. Bladder is unremarkable. Stomach/Bowel: Stomach is within normal limits. Appendix appears normal. Diverticulosis without diverticulitis. No evidence of bowel wall thickening, distention, or inflammatory changes. Vascular/Lymphatic: No enlarged abdominal or pelvic lymph nodes.Atherosclerotic calcifications of aorta Reproductive:  Surgical clips from prior prostatectomy. Other: Tiny fat containing umbilical hernia. No abdominopelvic ascites. Musculoskeletal: Left iliac bone sclerotic lesion measuring 2.4 x 1 cm stable to 2017. Multilevel degenerative changes of the spine. Posterior spinal fusion device in lower lumbar spine. IMPRESSION: No hydronephrosis or nephrolithiasis. Bilateral simple and hyperdense renal cortical cysts which does not require imaging follow-up. Prior prostatectomy.  No pelvic mass or lymphadenopathy. Colonic diverticulosis without diverticulitis. Atherosclerotic calcifications of aorta and coronary arteries. Stable sclerotic lesion of left iliac bone. Electronically Signed   By: Megan  Zare M.D.   On: 09/21/2023 15:25   Assessment/Plan Daryl Webb is a 78 y.o. male with medical history significant for CKD stage III, insulin -dependent type 2 diabetes, history of prostate cancer who does intermittent self cath and is being admitted to the hospital for UTI which failed outpatient management.   UTI-with continued leukocytosis, dysuria despite amoxicillin .  Urine culture obtained 8/13 is not finalized but is growing gram-negative bacilli. -Inpatient admission -Empiric IV Rocephin  -Follow-up urine culture results from 8/11  CKD stage III-renal function appears to be at baseline  Leukocytosis-due to UTI  Hyponatremia-minimal and asymptomatic, would monitor with daily labs  Type 2 diabetes-insulin -dependent, he takes 36 units of Humalog daily, last took it  yesterday 8/12 -Resume home basal insulin  dosing I reduced dose of 25 units daily and uptitrate as indicated -Carb modified diet -Moderate dose sliding scale  Hypertension-takes amlodipine  at home, but has not taken it since yesterday.  Despite that he is hypotensive here in the emergency department.  This could be due to his acute infection. -  Hold amlodipine   Depression-resume Celexa   DVT prophylaxis: Subcutaneous heparin     Code Status: Full Code  Consults called: None  Admission status: The appropriate patient status for this patient is INPATIENT. Inpatient status is judged to be reasonable and necessary in order to provide the required intensity of service to ensure the patient's safety. The patient's presenting symptoms, physical exam findings, and initial radiographic and laboratory data in the context of their chronic comorbidities is felt to place them at high risk for further clinical deterioration. Furthermore, it is not anticipated that the patient will be medically stable for discharge from the hospital within 2 midnights of admission.    I certify that at the point of admission it is my clinical judgment that the patient will require inpatient hospital care spanning beyond 2 midnights from the point of admission due to high intensity of service, high risk for further deterioration and high frequency of surveillance required  Time spent: 52 minutes  Virgilio Broadhead CHRISTELLA Gail MD [facility] Pager [phone]  If 7PM-7AM, please contact night-coverage [email or web address] Password [facility]  09/21/2023, 4:18 PM

## 2023-09-21 NOTE — ED Notes (Signed)
 PT provided urine sample; UA sent down to lab.

## 2023-09-22 DIAGNOSIS — N390 Urinary tract infection, site not specified: Secondary | ICD-10-CM | POA: Diagnosis not present

## 2023-09-22 DIAGNOSIS — E876 Hypokalemia: Secondary | ICD-10-CM

## 2023-09-22 LAB — BASIC METABOLIC PANEL WITH GFR
Anion gap: 8 (ref 5–15)
BUN: 41 mg/dL — ABNORMAL HIGH (ref 8–23)
CO2: 22 mmol/L (ref 22–32)
Calcium: 8 mg/dL — ABNORMAL LOW (ref 8.9–10.3)
Chloride: 104 mmol/L (ref 98–111)
Creatinine, Ser: 2.1 mg/dL — ABNORMAL HIGH (ref 0.61–1.24)
GFR, Estimated: 32 mL/min — ABNORMAL LOW (ref >60)
Glucose, Bld: 75 mg/dL (ref 70–99)
Potassium: 3.2 mmol/L — ABNORMAL LOW (ref 3.5–5.1)
Sodium: 134 mmol/L — ABNORMAL LOW (ref 135–145)

## 2023-09-22 LAB — CBC
HCT: 36.3 % — ABNORMAL LOW (ref 39.0–52.0)
Hemoglobin: 11.9 g/dL — ABNORMAL LOW (ref 13.0–17.0)
MCH: 34.6 pg — ABNORMAL HIGH (ref 26.0–34.0)
MCHC: 32.8 g/dL (ref 30.0–36.0)
MCV: 105.5 fL — ABNORMAL HIGH (ref 80.0–100.0)
Platelets: 145 K/uL — ABNORMAL LOW (ref 150–400)
RBC: 3.44 MIL/uL — ABNORMAL LOW (ref 4.22–5.81)
RDW: 13.5 % (ref 11.5–15.5)
WBC: 11 K/uL — ABNORMAL HIGH (ref 4.0–10.5)
nRBC: 0 % (ref 0.0–0.2)

## 2023-09-22 LAB — GLUCOSE, CAPILLARY
Glucose-Capillary: 73 mg/dL (ref 70–99)
Glucose-Capillary: 133 mg/dL — ABNORMAL HIGH (ref 70–99)
Glucose-Capillary: 118 mg/dL — ABNORMAL HIGH (ref 70–99)
Glucose-Capillary: 133 mg/dL — ABNORMAL HIGH (ref 70–99)

## 2023-09-22 LAB — HEMOGLOBIN A1C
Hgb A1c MFr Bld: 6.1 % — ABNORMAL HIGH (ref 4.8–5.6)
Mean Plasma Glucose: 128 mg/dL

## 2023-09-22 LAB — URINE CULTURE
MICRO NUMBER:: 16813648
SPECIMEN QUALITY:: ADEQUATE

## 2023-09-22 MED ORDER — SODIUM CHLORIDE 0.9 % IV SOLN: INTRAVENOUS | Status: DC

## 2023-09-22 MED ORDER — DONEPEZIL HCL 10 MG PO TABS
10.0000 mg | ORAL_TABLET | Freq: Every day | ORAL | Status: DC
  Administered 2023-09-22 – 2023-09-23 (×2): 10 mg via ORAL
  Filled 2023-09-22 (×2): qty 1

## 2023-09-22 MED ORDER — POTASSIUM CHLORIDE CRYS ER 20 MEQ PO TBCR
40.0000 meq | EXTENDED_RELEASE_TABLET | Freq: Once | ORAL | Status: AC
  Administered 2023-09-22: 40 meq via ORAL
  Filled 2023-09-22: qty 2

## 2023-09-22 NOTE — Plan of Care (Signed)
  Problem: Skin Integrity: Goal: Risk for impaired skin integrity will decrease Outcome: Progressing   Problem: Tissue Perfusion: Goal: Adequacy of tissue perfusion will improve Outcome: Progressing   Problem: Activity: Goal: Risk for activity intolerance will decrease Outcome: Progressing   Problem: Pain Managment: Goal: General experience of comfort will improve and/or be controlled Outcome: Progressing   Problem: Skin Integrity: Goal: Risk for impaired skin integrity will decrease Outcome: Progressing

## 2023-09-22 NOTE — Progress Notes (Signed)
   09/22/23 1541  TOC Brief Assessment  Insurance and Status Reviewed  Patient has primary care physician Yes ([place], Dickey MATSU, MD)  Home environment has been reviewed Home with wife  Prior level of function: Independent  Prior/Current Home Services No current home services  Social Drivers of Health Review SDOH reviewed no interventions necessary  Readmission risk has been reviewed Yes  Transition of care needs no transition of care needs at this time

## 2023-09-22 NOTE — Progress Notes (Signed)
 OT Cancellation Note  Patient Details Name: Daryl Webb MRN: [id number] DOB: Aug 05, 1945   Cancelled Treatment:    Reason Eval/Treat Not Completed: Fatigue/lethargy limiting ability to participate. Orders received, chart reviewed. Pt received fast asleep with even respirations, does not awaken to voice. OT will return at able.   Billie Trager L. Alix Stowers, OTR/L  09/22/23, 3:45 PM

## 2023-09-22 NOTE — Evaluation (Signed)
 Physical Therapy One Time Evaluation Patient Details Name: Daryl Webb MRN: [id number] DOB: 1945/11/06 Today's Date: 09/22/2023  History of Present Illness  Pt is a 78 y.o. male with history of prostate cancer who does intermittent self cath and admitted to the hospital for UTI which failed outpatient management.  Past medical history significant for but not limited to: CKD stage III, insulin -dependent type 2 diabetes, prostate cancer, spinal stenosis, clotting disorder, anemia, s/p TAVR  Clinical Impression  Patient evaluated by Physical Therapy with no further acute PT needs identified. All education has been completed and the patient has no further questions.   Pt reports mostly being in bed since admission and agreeable to ambulate. Pt ambulated in hallway and reports feeling at baseline.  Pt uses rollator for community ambulation and lives with spouse in ILF.  Pt feels safe with return to his home and declines need for f/u PT at this time.  PT is signing off. Thank you for this referral.         If plan is discharge home, recommend the following:     Can travel by private vehicle        Equipment Recommendations None recommended by PT  Recommendations for Other Services       Functional Status Assessment Patient has not had a recent decline in their functional status     Precautions / Restrictions Precautions Precautions: Fall      Mobility  Bed Mobility Overal bed mobility: Needs Assistance Bed Mobility: Supine to Sit     Supine to sit: Supervision, HOB elevated          Transfers Overall transfer level: Needs assistance Equipment used: Rolling walker (2 wheels) Transfers: Sit to/from Stand Sit to Stand: Contact guard assist, Supervision           General transfer comment: cues for hand placement    Ambulation/Gait Ambulation/Gait assistance: Supervision Gait Distance (Feet): 120 Feet Assistive device: Rolling walker (2 wheels) Gait  Pattern/deviations: Step-through pattern, Decreased stride length       General Gait Details: steady with RW, pt denies symptoms  Stairs            Wheelchair Mobility     Tilt Bed    Modified Rankin (Stroke Patients Only)       Balance Overall balance assessment: No apparent balance deficits (not formally assessed) (denies any recent falls)                                           Pertinent Vitals/Pain Pain Assessment Pain Assessment: No/denies pain    Home Living Family/patient expects to be discharged to:: Private residence Living Arrangements: Spouse/significant other   Type of Home: Independent living facility         Home Layout: One level Home Equipment: Rollator (4 wheels) Additional Comments: spouse brings him food from dining hall    Prior Function Prior Level of Function : Independent/Modified Independent             Mobility Comments: uses rollator when ambulating into facility, no AD in his home (lives at [facility], ILF section) ADLs Comments: reports independent     Extremity/Trunk Assessment        Lower Extremity Assessment Lower Extremity Assessment: Overall WFL for tasks assessed    Cervical / Trunk Assessment Cervical / Trunk Assessment: Normal  Communication   Communication  Communication: No apparent difficulties    Cognition Arousal: Alert Behavior During Therapy: WFL for tasks assessed/performed   PT - Cognitive impairments: No apparent impairments                         Following commands: Intact       Cueing       General Comments      Exercises     Assessment/Plan    PT Assessment Patient does not need any further PT services  PT Problem List         PT Treatment Interventions      PT Goals (Current goals can be found in the Care Plan section)  Acute Rehab PT Goals PT Goal Formulation: All assessment and education complete, DC therapy    Frequency        Co-evaluation               AM-PAC PT 6 Clicks Mobility  Outcome Measure Help needed turning from your back to your side while in a flat bed without using bedrails?: None Help needed moving from lying on your back to sitting on the side of a flat bed without using bedrails?: None Help needed moving to and from a bed to a chair (including a wheelchair)?: None Help needed standing up from a chair using your arms (e.g., wheelchair or bedside chair)?: A Little Help needed to walk in hospital room?: A Little Help needed climbing 3-5 steps with a railing? : A Little 6 Click Score: 21    End of Session Equipment Utilized During Treatment: Gait belt Activity Tolerance: Patient tolerated treatment well Patient left: in chair;with call bell/phone within reach;with chair alarm set Nurse Communication: Mobility status PT Visit Diagnosis: Difficulty in walking, not elsewhere classified (R26.2)    Time: 8874-8864 PT Time Calculation (min) (ACUTE ONLY): 10 min   Charges:   PT Evaluation $PT Eval Low Complexity: 1 Low   PT General Charges $$ ACUTE PT VISIT: 1 Visit        Tari PT, DPT Physical Therapist Acute Rehabilitation Services Office: [phone]  Tari CROME P[place] 09/22/2023, 3:33 PM

## 2023-09-22 NOTE — Progress Notes (Signed)
 [facility] PROGRESS NOTE   Daryl Webb FMW:[id number] DOB: March 05, 1945 DOA: 09/21/2023  PCP: [place], [name], MD  Brief History: 78 y.o. male with medical history significant for CKD stage III, insulin -dependent type 2 diabetes, history of prostate cancer who does intermittent self cath and is being admitted to the hospital for UTI which failed outpatient management.  He started having dysuria and frequent urination as well as generalized weakness about 6 days ago, finally went to his provider on 8/11.  Urinalysis was consistent with UTI he was started on amoxicillin .  He started taking it on the evening of 8/11, despite this he has continued dysuria, weakness.  He presented to the emergency department.  Underwent CT scan which did not show any acute findings.  He was hospitalized for further management.     Consultants: None  Procedures: None    Subjective/Interval History: Patient feels well this morning.  Denies any abdominal pain nausea or vomiting.  No shortness of breath or chest pain.    Assessment/Plan:  Acute urinary tract infection No evidence for sepsis at the time of admission.  BBC was elevated at 16.2 and has improved this morning. Urine culture from 8/11 is growing Klebsiella.  Multiple resistant noted including to ampicillin which could be the reason he failed amoxicillin .   Sensitive to cefazolin  and ceftriaxone .  Continue ceftriaxone  for now. Follow-up cultures from 8/13.  Chronic kidney disease stage IIIb/hypokalemia/hyponatremia Renal function close to baseline.  Supplement potassium.  Sodium level stable.  Macrocytic anemia/mild thrombocytopenia No evidence of overt bleeding.  Continue to monitor.  Diabetes mellitus type 2, insulin -dependent Monitor CBGs.  Noted to be on SSI and glargine.  Essential hypertension Holding antihypertensives.  He takes amlodipine  at home.  History of depression Noted to be on Celexa .  History of prostate  cancer with chronic urinary retention He self catheterizes once every week or so.  Followed by [facility].  Has been able to urinate here in the hospital.   DVT Prophylaxis: Subcutaneous heparin  Code Status: Full code Family Communication: Discussed with patient Disposition Plan: Ambulate.  Hopefully home in 24 to 48 hours  Status is: Inpatient Remains inpatient appropriate because: IV antibiotics      Medications: Scheduled:  citalopram   10 mg Oral Daily   donepezil   10 mg Oral QHS   heparin   5,000 Units Subcutaneous Q8H   insulin  aspart  0-15 Units Subcutaneous TID WC   insulin  aspart  0-5 Units Subcutaneous QHS   insulin  glargine-yfgn  25 Units Subcutaneous QHS   pantoprazole   40 mg Oral Daily   Continuous:  sodium chloride  100 mL/hr at 09/22/23 9047   cefTRIAXone  (ROCEPHIN )  IV     PRN:albuterol , ondansetron  **OR** ondansetron  (ZOFRAN ) IV, traMADol , traZODone   Antibiotics: Anti-infectives (From admission, onward)    Start     Dose/Rate Route Frequency Ordered Stop   09/22/23 1200  cefTRIAXone  (ROCEPHIN ) 1 g in sodium chloride  0.9 % 100 mL IVPB        1 g 200 mL/hr over 30 Minutes Intravenous Every 24 hours 09/21/23 1616     09/21/23 1200  cefTRIAXone  (ROCEPHIN ) 1 g in sodium chloride  0.9 % 100 mL IVPB        1 g 200 mL/hr over 30 Minutes Intravenous  Once 09/21/23 1158 09/21/23 1238       Objective:  Vital Signs  Vitals:   09/21/23 2217 09/22/23 0237 09/22/23 0550 09/22/23 1108  BP: 109/64 109/60 (!) 95/46 100/62  Pulse: 88 85  90 78  Resp: 14 14 14 20   Temp: 97.8 F (36.6 C) 98.2 F (36.8 C) 98.3 F (36.8 C) 98 F (36.7 C)  TempSrc: Oral Oral Oral Oral  SpO2: 94% 95% 94% 93%  Weight:      Height:        Intake/Output Summary (Last 24 hours) at 09/22/2023 1114 Last data filed at 09/22/2023 0751 Gross per 24 hour  Intake 100 ml  Output 775 ml  Net -675 ml   Filed Weights   09/21/23 1104 09/21/23 1746  Weight: 72.8 kg 71.2 kg     General appearance: Awake alert.  In no distress Resp: Clear to auscultation bilaterally.  Normal effort Cardio: S1-S2 is normal regular.  No S3-S4.  No rubs murmurs or bruit GI: Abdomen is soft.  Nontender nondistended.  Bowel sounds are present normal.  No masses organomegaly Extremities: No edema.  Physical deconditioning noted Neurologic: Alert and oriented x3.  No focal neurological deficits.    Lab Results:  Data Reviewed: I have personally reviewed following labs and reports of the imaging studies  CBC: Recent Labs  Lab 09/19/23 1511 09/21/23 1159 09/22/23 0524  WBC 16.4* 16.2* 11.0*  NEUTROABS 13.2*  --   --   HGB 13.9 13.1 11.9*  HCT 42.6 40.6 36.3*  MCV 102.7* 103.6* 105.5*  PLT 191.0 147* 145*    Basic Metabolic Panel: Recent Labs  Lab 09/19/23 1511 09/21/23 1159 09/22/23 0524  NA 135 133* 134*  K 4.3 3.3* 3.2*  CL 99 99 104  CO2 28 22 22   GLUCOSE 134* 134* 75  BUN 33* 45* 41*  CREATININE 2.23* 2.08* 2.10*  CALCIUM  9.1 8.8* 8.0*    GFR: Estimated Creatinine Clearance: 25.2 mL/min (A) (by C-G formula based on SCr of 2.1 mg/dL (H)).  Liver Function Tests: Recent Labs  Lab 09/19/23 1511  AST 18  ALT 22  ALKPHOS 80  BILITOT 0.9  PROT 7.0  ALBUMIN  3.8    Recent Labs  Lab 09/19/23 1511  LIPASE 22.0   CBG: Recent Labs  Lab 09/21/23 1738 09/21/23 2218 09/22/23 0756 09/22/23 1110  GLUCAP 81 116* 73 133*     Recent Results (from the past 240 hours)  Culture, Urine     Status: Abnormal   Collection Time: 09/19/23  3:15 PM   Specimen: Urine  Result Value Ref Range Status   MICRO NUMBER: [id number]  Final   SPECIMEN QUALITY: Adequate  Final   Sample Source URINE  Final   STATUS: FINAL  Final   ISOLATE 1: Klebsiella pneumoniae (A)  Final    Comment: Greater than 100,000 CFU/mL of Klebsiella pneumoniae      Susceptibility   Klebsiella pneumoniae - URINE CULTURE, REFLEX    AMOX/CLAVULANIC 8 Sensitive     AMPICILLIN/SULBACTAM >=32  Resistant     CEFAZOLIN  4 Intermediate     CEFTAZIDIME <=0.5 Sensitive     CEFEPIME <=0.12 Sensitive     CEFTRIAXONE  <=0.25 Sensitive     CIPROFLOXACIN >=4 Resistant     LEVOFLOXACIN >=8 Resistant     GENTAMICIN <=1 Sensitive     IMIPENEM <=0.25 Sensitive     MEROPENEM <=0.25 Sensitive     NITROFURANTOIN 64 Intermediate     PIP/TAZO <=4 Sensitive     TRIMETH /SULFA * >=320 Resistant      * Legend: S = Susceptible  I = Intermediate R = Resistant  NS = Not susceptible SDD = Susceptible Dose Dependent * = Not Tested  NR =  Not Reported **NN = See Therapy Comments   Blood culture (routine x 2)     Status: None (Preliminary result)   Collection Time: 09/21/23  3:55 PM   Specimen: BLOOD  Result Value Ref Range Status   Specimen Description   Final    BLOOD BLOOD LEFT ARM Performed at [facility], [address], [place], [place] [address]    Special Requests   Final    BOTTLES DRAWN AEROBIC ONLY Blood Culture results may not be optimal due to an inadequate volume of blood received in culture bottles Performed at [facility], [address] [place], [place] [address]    Culture   Final    NO GROWTH < 12 HOURS Performed at [facility], [address], [place], [place] [address]    Report Status PENDING  Incomplete  Blood culture (routine x 2)     Status: None (Preliminary result)   Collection Time: 09/21/23  6:00 PM   Specimen: BLOOD  Result Value Ref Range Status   Specimen Description   Final    BLOOD BLOOD RIGHT ARM Performed at [facility], [address] [place], [place] [address]    Special Requests   Final    BOTTLES DRAWN AEROBIC AND ANAEROBIC Blood Culture adequate volume Performed at [facility], [address], [place], [place] [address]    Culture   Final    NO GROWTH < 12 HOURS Performed at [facility], [address], [place], [place] [address]    Report Status  PENDING  Incomplete      Radiology Studies: CT Renal Stone Study Result Date: 09/21/2023 CLINICAL DATA:  Abdominal pain stone suspected, history of prostate cancer status post prostatectomy left iliac EXAM: CT ABDOMEN AND PELVIS WITHOUT CONTRAST TECHNIQUE: Multidetector CT imaging of the abdomen and pelvis was performed following the standard protocol without IV contrast. RADIATION DOSE REDUCTION: This exam was performed according to the departmental dose-optimization program which includes automated exposure control, adjustment of the mA and/or kV according to patient size and/or use of iterative reconstruction technique. COMPARISON:  None Available. FINDINGS: Limited evaluation of metastatic disease given the lack of contrast. Lower chest: Status post TAVR. No suspicious nodule. Normal heart size. Atherosclerotic calcifications of coronary arteries. Hepatobiliary: No focal liver abnormality is seen. No gallstones, gallbladder wall thickening, or biliary dilatation. Pancreas: Unremarkable. No pancreatic ductal dilatation or surrounding inflammatory changes. Spleen: Normal in size without focal abnormality. Adrenals/Urinary Tract: Adrenal glands are unremarkable. Right kidney hypodense cortical lesions up to 2.8 cm which does not require imaging follow-up, likely representing simple cortical cysts. Left kidney hypodense cortical lesion measuring 5 mm likely a hyperdense cyst containing proteinaceous material/blood products. No nephrolithiasis or hydronephrosis. Kidneys are normal, without renal calculi, focal lesion, or hydronephrosis. Bladder is unremarkable. Stomach/Bowel: Stomach is within normal limits. Appendix appears normal. Diverticulosis without diverticulitis. No evidence of bowel wall thickening, distention, or inflammatory changes. Vascular/Lymphatic: No enlarged abdominal or pelvic lymph nodes.Atherosclerotic calcifications of aorta Reproductive:  Surgical clips from prior prostatectomy. Other:  Tiny fat containing umbilical hernia. No abdominopelvic ascites. Musculoskeletal: Left iliac bone sclerotic lesion measuring 2.4 x 1 cm stable to 2017. Multilevel degenerative changes of the spine. Posterior spinal fusion device in lower lumbar spine. IMPRESSION: No hydronephrosis or nephrolithiasis. Bilateral simple and hyperdense renal cortical cysts which does not require imaging follow-up. Prior prostatectomy.  No pelvic mass or lymphadenopathy. Colonic diverticulosis without diverticulitis. Atherosclerotic calcifications of  aorta and coronary arteries. Stable sclerotic lesion of left iliac bone. Electronically Signed   By: Megan  Zare M.D.   On: 09/21/2023 15:25       LOS: 1 day   Joette Pebbles  [facility] Pager on [email or web address]  09/22/2023, 11:14 AM

## 2023-09-23 ENCOUNTER — Telehealth: Payer: Self-pay

## 2023-09-23 DIAGNOSIS — N390 Urinary tract infection, site not specified: Secondary | ICD-10-CM | POA: Diagnosis not present

## 2023-09-23 LAB — COMPREHENSIVE METABOLIC PANEL WITH GFR
ALT: 16 U/L (ref 0–44)
AST: 20 U/L (ref 15–41)
Albumin: 2.5 g/dL — ABNORMAL LOW (ref 3.5–5.0)
Alkaline Phosphatase: 59 U/L (ref 38–126)
Anion gap: 8 (ref 5–15)
BUN: 33 mg/dL — ABNORMAL HIGH (ref 8–23)
CO2: 20 mmol/L — ABNORMAL LOW (ref 22–32)
Calcium: 8.1 mg/dL — ABNORMAL LOW (ref 8.9–10.3)
Chloride: 109 mmol/L (ref 98–111)
Creatinine, Ser: 1.61 mg/dL — ABNORMAL HIGH (ref 0.61–1.24)
GFR, Estimated: 44 mL/min — ABNORMAL LOW (ref >60)
Glucose, Bld: 98 mg/dL (ref 70–99)
Potassium: 3.7 mmol/L (ref 3.5–5.1)
Sodium: 137 mmol/L (ref 135–145)
Total Bilirubin: 0.8 mg/dL (ref 0.0–1.2)
Total Protein: 5.8 g/dL — ABNORMAL LOW (ref 6.5–8.1)

## 2023-09-23 LAB — URINE CULTURE: Culture: <10000 — AB

## 2023-09-23 LAB — GLUCOSE, CAPILLARY
Glucose-Capillary: 73 mg/dL (ref 70–99)
Glucose-Capillary: 111 mg/dL — ABNORMAL HIGH (ref 70–99)
Glucose-Capillary: 123 mg/dL — ABNORMAL HIGH (ref 70–99)
Glucose-Capillary: 131 mg/dL — ABNORMAL HIGH (ref 70–99)

## 2023-09-23 LAB — CBC
HCT: 36.4 % — ABNORMAL LOW (ref 39.0–52.0)
Hemoglobin: 11.3 g/dL — ABNORMAL LOW (ref 13.0–17.0)
MCH: 33.2 pg (ref 26.0–34.0)
MCHC: 31 g/dL (ref 30.0–36.0)
MCV: 107.1 fL — ABNORMAL HIGH (ref 80.0–100.0)
Platelets: 156 K/uL (ref 150–400)
RBC: 3.4 MIL/uL — ABNORMAL LOW (ref 4.22–5.81)
RDW: 13.6 % (ref 11.5–15.5)
WBC: 7.7 K/uL (ref 4.0–10.5)
nRBC: 0 % (ref 0.0–0.2)

## 2023-09-23 LAB — MAGNESIUM: Magnesium: 2.3 mg/dL (ref 1.7–2.4)

## 2023-09-23 MED ORDER — CEFADROXIL 500 MG PO CAPS
500.0000 mg | ORAL_CAPSULE | Freq: Two times a day (BID) | ORAL | Status: DC
  Administered 2023-09-23 (×2): 500 mg via ORAL
  Filled 2023-09-23 (×3): qty 1

## 2023-09-23 MED ORDER — POLYETHYLENE GLYCOL 3350 17 G PO PACK
17.0000 g | PACK | Freq: Every day | ORAL | Status: DC | PRN
  Administered 2023-09-23: 17 g via ORAL
  Filled 2023-09-23: qty 1

## 2023-09-23 NOTE — Plan of Care (Signed)
  Problem: Coping: Goal: Ability to adjust to condition or change in health will improve Outcome: Progressing   Problem: Fluid Volume: Goal: Ability to maintain a balanced intake and output will improve Outcome: Progressing   Problem: Health Behavior/Discharge Planning: Goal: Ability to identify and utilize available resources and services will improve Outcome: Progressing Goal: Ability to manage health-related needs will improve Outcome: Progressing   Problem: Metabolic: Goal: Ability to maintain appropriate glucose levels will improve Outcome: Progressing   Problem: Nutritional: Goal: Maintenance of adequate nutrition will improve Outcome: Progressing Goal: Progress toward achieving an optimal weight will improve Outcome: Progressing

## 2023-09-23 NOTE — Telephone Encounter (Signed)
 Copied from CRM #[id number]. Topic: Clinical - Medical Advice >> Sep 23, 2023  8:45 AM Herma G wrote: Reason for CRM: Pt's wife, Mrs. Olam, called stating that pt was seen by Dr. Mercer on 8/11 for a UTI and that he was put on antibiotics but that they're not really working well, he is hospitalized at [facility] located at [address] [place], [place], [place] [address] due to this and she wants to know if would be possible to upload results from recent urine culture done for pt through MyChart or to send it to clinic so he can be put on a different antibiotic, I told her I could see some results posted through MyChart on 8/13 but was unsure if that was the info she needed. Call her back if needed at [phone].

## 2023-09-23 NOTE — Plan of Care (Signed)
   Problem: Skin Integrity: Goal: Risk for impaired skin integrity will decrease Outcome: Progressing   Problem: Activity: Goal: Risk for activity intolerance will decrease Outcome: Progressing   Problem: Pain Managment: Goal: General experience of comfort will improve and/or be controlled Outcome: Progressing   Problem: Safety: Goal: Ability to remain free from injury will improve Outcome: Progressing

## 2023-09-23 NOTE — Telephone Encounter (Signed)
 Spoke with pt's wife, advised culture did come back yesterday. Pt's wife will alert the nurses station to left hospital provider know.

## 2023-09-23 NOTE — Progress Notes (Signed)
 Mobility Specialist - Progress Note   09/23/23 1414  Mobility  Activity Ambulated with assistance  Level of Assistance Standby assist, set-up cues, supervision of patient - no hands on  Assistive Device Front wheel walker  Distance Ambulated (ft) 500 ft  Range of Motion/Exercises Active  Activity Response Tolerated well  Mobility Referral Yes  Mobility visit 1 Mobility  Mobility Specialist Start Time (ACUTE ONLY) 1355  Mobility Specialist Stop Time (ACUTE ONLY) 1414  Mobility Specialist Time Calculation (min) (ACUTE ONLY) 19 min   Pt was found in bed and agreeable to ambulate. No complaints and returned to bed with all needs met. Call bell in reach and bed alarm on. Wife in room.  Erminio Leos,  Mobility Specialist Can be reached via Secure Chat

## 2023-09-23 NOTE — Evaluation (Signed)
 Occupational Therapy Evaluation Patient Details Name: Daryl Webb MRN: [id number] DOB: 1945/06/22 Today's Date: 09/23/2023   History of Present Illness   Pt is a 78 y.o. male with history of prostate cancer who does intermittent self cath and admitted to the hospital for UTI which failed outpatient management.  Past medical history significant for but not limited to: CKD stage III, insulin -dependent type 2 diabetes, prostate cancer, spinal stenosis, clotting disorder, anemia, s/p TAVR     Clinical Impressions Pt seen for OT evaluation. Prior to admission, pt lives with spouse in ILF and is mod independent for ADL performance with rollator. Pt endorses feeling at his functional baseline. Completes bed mobility, functional transfers and mobility 300+ ft in hallway with RW at supervision-CGA level for safety. Pt denies weakness and presents with minor deficits in activity tolerance, fatiguing slightly after mobility. Pt encouraged to mobilize with MS and nursing staff as able to prevent further deconditioning. No acute needs identified, OT to complete orders and sign off.      If plan is discharge home, recommend the following:   A little help with walking and/or transfers;A little help with bathing/dressing/bathroom;Assist for transportation     Functional Status Assessment   Patient has not had a recent decline in their functional status     Equipment Recommendations   None recommended by OT      Precautions/Restrictions   Precautions Precautions: Fall Recall of Precautions/Restrictions: Intact Restrictions Weight Bearing Restrictions Per Provider Order: No     Mobility Bed Mobility Overal bed mobility: Needs Assistance Bed Mobility: Supine to Sit, Sit to Supine     Supine to sit: Supervision, HOB elevated Sit to supine: Supervision        Transfers Overall transfer level: Needs assistance Equipment used: Rolling walker (2 wheels) Transfers: Sit  to/from Stand Sit to Stand: Supervision, Contact guard assist           General transfer comment: CGA fading to supervision      Balance Overall balance assessment: No apparent balance deficits (not formally assessed)                                         ADL either performed or assessed with clinical judgement   ADL Overall ADL's : Needs assistance/impaired Eating/Feeding: Independent;Sitting   Grooming: Sitting;Independent   Upper Body Bathing: Sitting;Independent   Lower Body Bathing: Sit to/from stand;Set up   Upper Body Dressing : Sitting;Independent   Lower Body Dressing: Set up;Sit to/from stand   Toilet Transfer: Supervision/safety;Rolling walker (2 wheels)   Toileting- Clothing Manipulation and Hygiene: Supervision/safety       Functional mobility during ADLs: Supervision/safety;Rolling walker (2 wheels)       Vision Baseline Vision/History: 1 Wears glasses Ability to See in Adequate Light: 0 Adequate Patient Visual Report: No change from baseline              Pertinent Vitals/Pain Pain Assessment Pain Assessment: No/denies pain     Extremity/Trunk Assessment Upper Extremity Assessment Upper Extremity Assessment: Right hand dominant;Overall [facility] for tasks assessed   Lower Extremity Assessment Lower Extremity Assessment: Overall WFL for tasks assessed;Defer to PT evaluation   Cervical / Trunk Assessment Cervical / Trunk Assessment: Normal   Communication Communication Communication: No apparent difficulties   Cognition Arousal: Alert Behavior During Therapy: WFL for tasks assessed/performed Cognition: No apparent impairments  Following commands: Intact       Cueing  General Comments   Cueing Techniques: Verbal cues              Home Living Family/patient expects to be discharged to:: Private residence Living Arrangements: Spouse/significant other Available Help  at Discharge: Family Type of Home: Independent living facility       Home Layout: One level     Bathroom Shower/Tub: Producer, television/film/video: Handicapped height Bathroom Accessibility: Yes How Accessible: Accessible via walker Home Equipment: Agricultural consultant (2 wheels);Shower seat - built in;Grab bars - toilet;Grab bars - tub/shower;Hand held shower head   Additional Comments: spouse brings him food from dining hall      Prior Functioning/Environment Prior Level of Function : Independent/Modified Independent             Mobility Comments: uses rollator when ambulating into facility, no AD in his home (lives at [facility], ILF section) ADLs Comments: reports independent            OT Goals(Current goals can be found in the care plan section)   Acute Rehab OT Goals OT Goal Formulation: All assessment and education complete, DC therapy   AM-PAC OT 6 Clicks Daily Activity     Outcome Measure Help from another person eating meals?: None Help from another person taking care of personal grooming?: None Help from another person toileting, which includes using toliet, bedpan, or urinal?: A Little Help from another person bathing (including washing, rinsing, drying)?: A Little Help from another person to put on and taking off regular upper body clothing?: None Help from another person to put on and taking off regular lower body clothing?: A Little 6 Click Score: 21   End of Session Equipment Utilized During Treatment: Rolling walker (2 wheels) Nurse Communication: Mobility status  Activity Tolerance: Patient tolerated treatment well Patient left: in bed;with call bell/phone within reach;with bed alarm set                   Time: 9070-9043 OT Time Calculation (min): 27 min Charges:  OT General Charges $OT Visit: 1 Visit OT Evaluation $OT Eval Low Complexity: 1 Low  Hermine Feria L. Gailyn Crook, OTR/L  09/23/23, 12:53 PM

## 2023-09-23 NOTE — Progress Notes (Signed)
 [facility] PROGRESS NOTE   Daryl Webb FMW:[id number] DOB: 12-01-1945 DOA: 09/21/2023  PCP: [place], [name], MD  Brief History: 78 y.o. male with medical history significant for CKD stage III, insulin -dependent type 2 diabetes, history of prostate cancer who does intermittent self cath and is being admitted to the hospital for UTI which failed outpatient management.  He started having dysuria and frequent urination as well as generalized weakness about 6 days ago, finally went to his provider on 8/11.  Urinalysis was consistent with UTI he was started on amoxicillin .  He started taking it on the evening of 8/11, despite this he has continued dysuria, weakness.  He presented to the emergency department.  Underwent CT scan which did not show any acute findings.  He was hospitalized for further management.     Consultants: None  Procedures: None    Subjective/Interval History: Patient feels well.  Denies any abdominal pain nausea or vomiting.  Feeling a little bit stronger.  Appetite is improving.    Assessment/Plan:  Acute urinary tract infection No evidence for sepsis at the time of admission.  WBC was elevated at 16.2 at admission.  Noted to be normal today.   Urine culture from 8/11 is growing Klebsiella.  Multiple resistant noted including to ampicillin which could be the reason he failed amoxicillin .  Sensitive to cefazolin  and ceftriaxone .  Urine cultures from 8/13 with insignificant growth.  Patient has improved clinically.  We will transition him to cefadroxil  today.  Chronic kidney disease stage IIIb/hypokalemia/hyponatremia Renal function close to baseline.  Supplement potassium.  Sodium level stable.  Macrocytic anemia/mild thrombocytopenia No evidence of overt bleeding.  Continue to monitor.  Diabetes mellitus type 2, insulin -dependent Monitor CBGs.  Noted to be on SSI and glargine.  Essential hypertension Holding antihypertensives.  He takes  amlodipine  at home.  Blood pressure is reasonably well-controlled.  History of depression Noted to be on Celexa .  History of prostate cancer with chronic urinary retention He self catheterizes once every week or so.  Followed by [facility].  Has been able to urinate here in the hospital.   DVT Prophylaxis: Subcutaneous heparin  Code Status: Full code Family Communication: Discussed with patient Disposition Plan: Changed to oral antibiotics today.  Ambulate in the hallway.  Anticipate discharge tomorrow if he remains stable.     Medications: Scheduled:  citalopram   10 mg Oral Daily   donepezil   10 mg Oral QHS   heparin   5,000 Units Subcutaneous Q8H   insulin  aspart  0-15 Units Subcutaneous TID WC   insulin  aspart  0-5 Units Subcutaneous QHS   insulin  glargine-yfgn  25 Units Subcutaneous QHS   pantoprazole   40 mg Oral Daily   Continuous:  sodium chloride  100 mL/hr at 09/23/23 0600   cefTRIAXone  (ROCEPHIN )  IV 1 g (09/22/23 1145)   PRN:albuterol , ondansetron  **OR** ondansetron  (ZOFRAN ) IV, polyethylene glycol, traMADol , traZODone   Antibiotics: Anti-infectives (From admission, onward)    Start     Dose/Rate Route Frequency Ordered Stop   09/22/23 1200  cefTRIAXone  (ROCEPHIN ) 1 g in sodium chloride  0.9 % 100 mL IVPB        1 g 200 mL/hr over 30 Minutes Intravenous Every 24 hours 09/21/23 1616     09/21/23 1200  cefTRIAXone  (ROCEPHIN ) 1 g in sodium chloride  0.9 % 100 mL IVPB        1 g 200 mL/hr over 30 Minutes Intravenous  Once 09/21/23 1158 09/21/23 1238       Objective:  Vital  Signs  Vitals:   09/22/23 1108 09/22/23 1400 09/22/23 2344 09/23/23 0500  BP: 100/62 106/62 (!) 104/52 118/64  Pulse: 78 75 71 74  Resp: 20 20 16 16   Temp: 98 F (36.7 C) (!) 97.3 F (36.3 C) 98 F (36.7 C) 97.7 F (36.5 C)  TempSrc: Oral Oral Oral Oral  SpO2: 93% 96% 93% 97%  Weight:      Height:        Intake/Output Summary (Last 24 hours) at 09/23/2023 1011 Last data filed  at 09/23/2023 0600 Gross per 24 hour  Intake 2897.54 ml  Output 850 ml  Net 2047.54 ml   Filed Weights   09/21/23 1104 09/21/23 1746  Weight: 72.8 kg 71.2 kg    General appearance: Awake alert.  In no distress Resp: Clear to auscultation bilaterally.  Normal effort Cardio: S1-S2 is normal regular.  No S3-S4.  No rubs murmurs or bruit GI: Abdomen is soft.  Nontender nondistended.  Bowel sounds are present normal.  No masses organomegaly Extremities: No edema.  Full range of motion of lower extremities. Neurologic: Alert and oriented x3.  No focal neurological deficits.    Lab Results:  Data Reviewed: I have personally reviewed following labs and reports of the imaging studies  CBC: Recent Labs  Lab 09/19/23 1511 09/21/23 1159 09/22/23 0524 09/23/23 0503  WBC 16.4* 16.2* 11.0* 7.7  NEUTROABS 13.2*  --   --   --   HGB 13.9 13.1 11.9* 11.3*  HCT 42.6 40.6 36.3* 36.4*  MCV 102.7* 103.6* 105.5* 107.1*  PLT 191.0 147* 145* 156    Basic Metabolic Panel: Recent Labs  Lab 09/19/23 1511 09/21/23 1159 09/22/23 0524 09/23/23 0503  NA 135 133* 134* 137  K 4.3 3.3* 3.2* 3.7  CL 99 99 104 109  CO2 28 22 22  20*  GLUCOSE 134* 134* 75 98  BUN 33* 45* 41* 33*  CREATININE 2.23* 2.08* 2.10* 1.61*  CALCIUM  9.1 8.8* 8.0* 8.1*  MG  --   --   --  2.3    GFR: Estimated Creatinine Clearance: 32.9 mL/min (A) (by C-G formula based on SCr of 1.61 mg/dL (H)).  Liver Function Tests: Recent Labs  Lab 09/19/23 1511 09/23/23 0503  AST 18 20  ALT 22 16  ALKPHOS 80 59  BILITOT 0.9 0.8  PROT 7.0 5.8*  ALBUMIN  3.8 2.5*    Recent Labs  Lab 09/19/23 1511  LIPASE 22.0   CBG: Recent Labs  Lab 09/22/23 0756 09/22/23 1110 09/22/23 1645 09/22/23 2216 09/23/23 0819  GLUCAP 73 133* 118* 133* 73     Recent Results (from the past 240 hours)  Culture, Urine     Status: Abnormal   Collection Time: 09/19/23  3:15 PM   Specimen: Urine  Result Value Ref Range Status   MICRO  NUMBER: [id number]  Final   SPECIMEN QUALITY: Adequate  Final   Sample Source URINE  Final   STATUS: FINAL  Final   ISOLATE 1: Klebsiella pneumoniae (A)  Final    Comment: Greater than 100,000 CFU/mL of Klebsiella pneumoniae      Susceptibility   Klebsiella pneumoniae - URINE CULTURE, REFLEX    AMOX/CLAVULANIC 8 Sensitive     AMPICILLIN/SULBACTAM >=32 Resistant     CEFAZOLIN  4 Intermediate     CEFTAZIDIME <=0.5 Sensitive     CEFEPIME <=0.12 Sensitive     CEFTRIAXONE  <=0.25 Sensitive     CIPROFLOXACIN >=4 Resistant     LEVOFLOXACIN >=8 Resistant  GENTAMICIN <=1 Sensitive     IMIPENEM <=0.25 Sensitive     MEROPENEM <=0.25 Sensitive     NITROFURANTOIN 64 Intermediate     PIP/TAZO <=4 Sensitive     TRIMETH /SULFA * >=320 Resistant      * Legend: S = Susceptible  I = Intermediate R = Resistant  NS = Not susceptible SDD = Susceptible Dose Dependent * = Not Tested  NR = Not Reported **NN = See Therapy Comments   Blood culture (routine x 2)     Status: None (Preliminary result)   Collection Time: 09/21/23  3:55 PM   Specimen: BLOOD  Result Value Ref Range Status   Specimen Description   Final    BLOOD BLOOD LEFT ARM Performed at [facility], [address] [place], [place] [address]    Special Requests   Final    BOTTLES DRAWN AEROBIC ONLY Blood Culture results may not be optimal due to an inadequate volume of blood received in culture bottles Performed at [facility], [address], [place], [place] [address]    Culture   Final    NO GROWTH 2 DAYS Performed at [facility], [address] [place], [place] [address]    Report Status PENDING  Incomplete  Blood culture (routine x 2)     Status: None (Preliminary result)   Collection Time: 09/21/23  6:00 PM   Specimen: BLOOD  Result Value Ref Range Status   Specimen Description   Final    BLOOD BLOOD RIGHT ARM Performed at [facility], [address]  [place], [place] [address]    Special Requests   Final    BOTTLES DRAWN AEROBIC AND ANAEROBIC Blood Culture adequate volume Performed at [facility], [address] [place], [place] [address]    Culture   Final    NO GROWTH 2 DAYS Performed at [facility], [address], [place], [place] [address]    Report Status PENDING  Incomplete  Urine Culture     Status: Abnormal   Collection Time: 09/21/23  8:30 PM   Specimen: Urine, Clean Catch  Result Value Ref Range Status   Specimen Description   Final    URINE, CLEAN CATCH Performed at [facility], [address] [place], [place] [address]    Special Requests   Final    NONE Performed at [facility], [address], [place], [place] [address]    Culture (A)  Final    <10,000 COLONIES/mL INSIGNIFICANT GROWTH Performed at [facility], [address] [place], [place] [address]    Report Status 09/23/2023 FINAL  Final      Radiology Studies: CT Renal Stone Study Result Date: 09/21/2023 CLINICAL DATA:  Abdominal pain stone suspected, history of prostate cancer status post prostatectomy left iliac EXAM: CT ABDOMEN AND PELVIS WITHOUT CONTRAST TECHNIQUE: Multidetector CT imaging of the abdomen and pelvis was performed following the standard protocol without IV contrast. RADIATION DOSE REDUCTION: This exam was performed according to the departmental dose-optimization program which includes automated exposure control, adjustment of the mA and/or kV according to patient size and/or use of iterative reconstruction technique. COMPARISON:  None Available. FINDINGS: Limited evaluation of metastatic disease given the lack of contrast. Lower chest: Status post TAVR. No suspicious nodule. Normal heart size. Atherosclerotic calcifications of coronary arteries. Hepatobiliary: No focal liver abnormality is seen. No gallstones, gallbladder wall thickening, or biliary dilatation. Pancreas:  Unremarkable. No pancreatic ductal dilatation or surrounding inflammatory changes. Spleen: Normal in size without focal abnormality. Adrenals/Urinary Tract: Adrenal glands are unremarkable. Right kidney hypodense cortical lesions up to 2.8 cm which does not require imaging follow-up, likely representing simple cortical cysts. Left kidney hypodense cortical lesion measuring 5 mm likely a hyperdense cyst containing proteinaceous material/blood products. No nephrolithiasis or hydronephrosis. Kidneys are normal, without renal calculi, focal lesion, or hydronephrosis. Bladder is unremarkable. Stomach/Bowel: Stomach is within normal limits. Appendix appears normal. Diverticulosis without diverticulitis. No evidence of bowel wall thickening, distention, or inflammatory changes. Vascular/Lymphatic: No enlarged abdominal or pelvic lymph nodes.Atherosclerotic calcifications of aorta Reproductive:  Surgical clips from prior prostatectomy. Other: Tiny fat containing umbilical hernia. No abdominopelvic ascites. Musculoskeletal: Left iliac bone sclerotic lesion measuring 2.4 x 1 cm stable to 2017. Multilevel degenerative changes of the spine. Posterior spinal fusion device in lower lumbar spine. IMPRESSION: No hydronephrosis or nephrolithiasis. Bilateral simple and hyperdense renal cortical cysts which does not require imaging follow-up. Prior prostatectomy.  No pelvic mass or lymphadenopathy. Colonic diverticulosis without diverticulitis. Atherosclerotic calcifications of aorta and coronary arteries. Stable sclerotic lesion of left iliac bone. Electronically Signed   By: Megan  Zare M.D.   On: 09/21/2023 15:25       LOS: 2 days   Anja Neuzil Verdene  [facility] Pager on [email or web address]  09/23/2023, 10:11 AM

## 2023-09-23 NOTE — Plan of Care (Signed)

## 2023-09-24 ENCOUNTER — Other Ambulatory Visit (HOSPITAL_COMMUNITY): Payer: Self-pay

## 2023-09-24 DIAGNOSIS — A419 Sepsis, unspecified organism: Secondary | ICD-10-CM

## 2023-09-24 DIAGNOSIS — E871 Hypo-osmolality and hyponatremia: Secondary | ICD-10-CM | POA: Diagnosis not present

## 2023-09-24 DIAGNOSIS — E876 Hypokalemia: Secondary | ICD-10-CM | POA: Insufficient documentation

## 2023-09-24 DIAGNOSIS — R339 Retention of urine, unspecified: Secondary | ICD-10-CM | POA: Insufficient documentation

## 2023-09-24 DIAGNOSIS — D696 Thrombocytopenia, unspecified: Secondary | ICD-10-CM

## 2023-09-24 DIAGNOSIS — D539 Nutritional anemia, unspecified: Secondary | ICD-10-CM

## 2023-09-24 DIAGNOSIS — N1832 Chronic kidney disease, stage 3b: Secondary | ICD-10-CM

## 2023-09-24 DIAGNOSIS — N390 Urinary tract infection, site not specified: Secondary | ICD-10-CM | POA: Diagnosis not present

## 2023-09-24 LAB — GLUCOSE, CAPILLARY
Glucose-Capillary: 77 mg/dL (ref 70–99)
Glucose-Capillary: 152 mg/dL — ABNORMAL HIGH (ref 70–99)

## 2023-09-24 MED ORDER — SODIUM CHLORIDE 0.9 % IV SOLN
1.0000 g | INTRAVENOUS | Status: DC
  Administered 2023-09-24: 1 g via INTRAVENOUS
  Filled 2023-09-24: qty 10

## 2023-09-24 MED ORDER — CEFPODOXIME PROXETIL 100 MG/5ML PO SUSR: 200.0000 mg | Freq: Two times a day (BID) | ORAL | Status: DC

## 2023-09-24 MED ORDER — CEFPODOXIME PROXETIL 200 MG PO TABS
200.0000 mg | ORAL_TABLET | Freq: Two times a day (BID) | ORAL | 0 refills | Status: AC
  Filled 2023-09-24: qty 8, 4d supply, fill #0

## 2023-09-24 NOTE — Hospital Course (Signed)
 78 y.o. male with medical history significant for CKD stage III, insulin -dependent type 2 diabetes, history of prostate cancer who does intermittent self cath and is being admitted to the hospital for UTI which failed outpatient management.  He started having dysuria and frequent urination as well as generalized weakness about 6 days ago, finally went to his provider on 8/11.   Urinalysis was consistent with UTI he was started on amoxicillin .  He started taking it on the evening of 8/11, despite this he has continued dysuria, weakness.  He presented to the emergency department.  Underwent CT scan which did not show any acute findings.  He was hospitalized for further management.     Assessment/Plan:   Acute urinary tract infection No evidence for sepsis at the time of admission.  WBC was elevated at 16.2 at admission.  Urine culture from 8/11 is growing Klebsiella.  Multiple resistant noted including to ampicillin which could be the reason he failed amoxicillin . Urine cultures from 8/13 with insignificant growth.  Patient has improved clinically.  - Treated with Rocephin  during hospitalization and transitioned to Vantin  at discharge to complete course (total of 7 days)   Chronic kidney disease stage IIIb/hypokalemia/hyponatremia Renal function close to baseline.  Supplement potassium.  Sodium level stable.   Macrocytic anemia/mild thrombocytopenia No evidence of overt bleeding.   Diabetes mellitus type 2, insulin -dependent Resume home regimen   Essential hypertension Resume home regimen    History of depression Noted to be on Celexa .   History of prostate cancer with chronic urinary retention -Endorses self-catheterization at home approximately 4-5 times a week -Follows with urology with soon upcoming appointment he says

## 2023-09-24 NOTE — Progress Notes (Signed)
 Discharge med in a secure bag delivered to pt in room by this RN

## 2023-09-24 NOTE — Discharge Summary (Signed)
 Physician Discharge Summary   Daryl Webb FMW:[id number] DOB: 01-26-1946 DOA: 09/21/2023  PCP: [place], [name], MD  Admit date: 09/21/2023 Discharge date: 09/24/2023  Admitted From: Home Disposition: Home Discharging physician: Alm Apo, MD Barriers to discharge: None  Recommendations at discharge: Follow-up with urology as planned   Discharge Condition: stable CODE STATUS: Full Diet recommendation:  Diet Orders (From admission, onward)     Start     Ordered   09/24/23 0000  Diet general        09/24/23 1022   09/21/23 1613  Diet Carb Modified Fluid consistency: Thin; Room service appropriate? Yes  Diet effective now       Question Answer Comment  Diet-HS Snack? Nothing   Calorie Level Medium 1600-2000   Fluid consistency: Thin   Room service appropriate? Yes      09/21/23 1615            Hospital Course: 78 y.o. male with medical history significant for CKD stage III, insulin -dependent type 2 diabetes, history of prostate cancer who does intermittent self cath and is being admitted to the hospital for UTI which failed outpatient management.  He started having dysuria and frequent urination as well as generalized weakness about 6 days ago, finally went to his provider on 8/11.   Urinalysis was consistent with UTI he was started on amoxicillin .  He started taking it on the evening of 8/11, despite this he has continued dysuria, weakness.  He presented to the emergency department.  Underwent CT scan which did not show any acute findings.  He was hospitalized for further management.     Assessment/Plan:   Acute urinary tract infection No evidence for sepsis at the time of admission.  WBC was elevated at 16.2 at admission.  Urine culture from 8/11 is growing Klebsiella.  Multiple resistant noted including to ampicillin which could be the reason he failed amoxicillin . Urine cultures from 8/13 with insignificant growth.  Patient has improved clinically.  -  Treated with Rocephin  during hospitalization and transitioned to Vantin  at discharge to complete course (total of 7 days)   Chronic kidney disease stage IIIb/hypokalemia/hyponatremia Renal function close to baseline.  Supplement potassium.  Sodium level stable.   Macrocytic anemia/mild thrombocytopenia No evidence of overt bleeding.   Diabetes mellitus type 2, insulin -dependent Resume home regimen   Essential hypertension Resume home regimen    History of depression Noted to be on Celexa .   History of prostate cancer with chronic urinary retention -Endorses self-catheterization at home approximately 4-5 times a week -Follows with urology with soon upcoming appointment he says    The patient's acute and chronic medical conditions were treated accordingly. On day of discharge, patient was felt deemed stable for discharge. Patient/family member advised to call PCP or come back to ER if needed.   Principal Diagnosis: Sepsis secondary to UTI [facility])  Discharge Diagnoses: Active Hospital Problems   Diagnosis Date Noted   Hypokalemia 09/24/2023   Hyponatremia 09/24/2023   Macrocytic anemia 09/24/2023   Thrombocytopenia (HCC) 09/24/2023   Urinary retention 09/24/2023   Stage 3b chronic kidney disease (HCC) 07/03/2017   Essential hypertension 02/12/2016   Type 2 diabetes mellitus with renal manifestations (HCC) 09/01/2011    Resolved Hospital Problems   Diagnosis Date Noted Date Resolved   Sepsis secondary to UTI [facility]) 09/21/2023 09/24/2023    Priority: 1.     Discharge Instructions     Diet general   Complete by: As directed    Increase activity  slowly   Complete by: As directed       Allergies as of 09/24/2023       Reactions   Aspirin  Other (See Comments)   History of bleeding ulcers!!! Only Tylenol  is tolerated.   Diclofenac  Other (See Comments)   History of bleeding ulcers!!! Only Tylenol  is tolerated.   Excedrin Tension Headache [acetaminophen -caffeine] Other  (See Comments)   History of bleeding ulcers!!! Only Tylenol  is tolerated.   Motrin [ibuprofen] Other (See Comments)   History of bleeding ulcers!!! Only Tylenol  is tolerated.   Nsaids Other (See Comments)   History of bleeding ulcers!!! Only Tylenol  is tolerated.        Medication List     STOP taking these medications    amoxicillin -clavulanate 500-125 MG tablet Commonly known as: Augmentin        TAKE these medications    amLODipine  2.5 MG tablet Commonly known as: NORVASC  TAKE 1 TABLET BY MOUTH EVERY DAY What changed: when to take this   atorvastatin  40 MG tablet Commonly known as: LIPITOR Take 1 tablet (40 mg total) by mouth daily. What changed: when to take this   Basaglar  KwikPen 100 UNIT/ML Inject 36 Units into the skin daily after lunch.   BD Pen Needle Nano 2nd Gen 32G X 4 MM Misc Generic drug: Insulin  Pen Needle daily.   calcium  carbonate 500 MG chewable tablet Commonly known as: TUMS - dosed in mg elemental calcium  Chew 2 tablets by mouth daily as needed for indigestion or heartburn.   cefpodoxime  200 MG tablet Commonly known as: VANTIN  Take 1 tablet (200 mg total) by mouth 2 (two) times daily for 4 days. Start taking on: September 25, 2023   citalopram  10 MG tablet Commonly known as: CELEXA  TAKE 1 TABLET BY MOUTH EVERY DAY What changed: when to take this   CoQ10 100 MG Caps Take 100 mg by mouth daily.   CRANBERRY PO Take 1 tablet by mouth in the morning.   Dexcom G7 Receiver Espiridion use to monitor blood sugar for 365 days   Dexcom G7 Sensor Misc USE TO CHECK SUGAR CONTINUOUSLY What changed:  how much to take how to take this when to take this additional instructions   donepezil  10 MG tablet Commonly known as: ARICEPT  Take 1 tablet (10 mg total) by mouth at bedtime.   ferrous gluconate  324 MG tablet Commonly known as: FERGON TAKE 1 TABLET BY MOUTH EVERY DAY WITH BREAKFAST What changed: See the new instructions.   glucose 4 GM  chewable tablet Chew 1 tablet by mouth as needed for low blood sugar.   losartan -hydrochlorothiazide  100-25 MG tablet Commonly known as: HYZAAR Take 1 tablet by mouth in the morning.   Melatonin 5 MG Chew Chew 15 mg by mouth at bedtime.   metoprolol  succinate 50 MG 24 hr tablet Commonly known as: TOPROL -XL TAKE 1 TABLET DAILY, WITH  OR IMMEDIATELY FOLLOWING A MEAL What changed: See the new instructions.   NON FORMULARY Take 4 tablets by mouth See admin instructions. Focus Factor tablets - Take 4 tablets by mouth in the morning with breakfast   Ozempic (0.25 or 0.5 MG/DOSE) 2 MG/3ML Sopn Generic drug: Semaglutide(0.25 or 0.5MG /DOS) Inject 0.5 mg into the skin every Sunday.   pantoprazole  40 MG tablet Commonly known as: PROTONIX  TAKE 1 TABLET BY MOUTH EVERY DAY What changed:  how much to take when to take this   TYLENOL  500 MG tablet Generic drug: acetaminophen  Take 1,000 mg by mouth every 6 (six) hours  as needed (for headaches).   Vitamin D3 1000 units Caps Take 2,000 Units by mouth daily.        Allergies  Allergen Reactions   Aspirin  Other (See Comments)    History of bleeding ulcers!!! Only Tylenol  is tolerated.   Diclofenac  Other (See Comments)    History of bleeding ulcers!!! Only Tylenol  is tolerated.   Excedrin Tension Headache [Acetaminophen -Caffeine] Other (See Comments)    History of bleeding ulcers!!! Only Tylenol  is tolerated.   Motrin [Ibuprofen] Other (See Comments)    History of bleeding ulcers!!! Only Tylenol  is tolerated.   Nsaids Other (See Comments)    History of bleeding ulcers!!! Only Tylenol  is tolerated.    Consultations:   Procedures:   Discharge Exam: BP 119/74 (BP Location: Right Arm)   Pulse 70   Temp 98.6 F (37 C)   Resp 20   Ht 5' 5 (1.651 m)   Wt 71.2 kg   SpO2 95%   BMI 26.13 kg/m  Physical Exam Constitutional:      General: He is not in acute distress.    Appearance: Normal appearance.  HENT:     Head:  Normocephalic and atraumatic.     Mouth/Throat:     Mouth: Mucous membranes are moist.  Eyes:     Extraocular Movements: Extraocular movements intact.  Cardiovascular:     Rate and Rhythm: Normal rate and regular rhythm.  Pulmonary:     Effort: Pulmonary effort is normal. No respiratory distress.     Breath sounds: Normal breath sounds. No wheezing.  Abdominal:     General: Bowel sounds are normal. There is no distension.     Palpations: Abdomen is soft.     Tenderness: There is no abdominal tenderness.  Musculoskeletal:        General: Normal range of motion.     Cervical back: Normal range of motion and neck supple.  Skin:    General: Skin is warm and dry.  Neurological:     General: No focal deficit present.     Mental Status: He is alert.  Psychiatric:        Mood and Affect: Mood normal.        Behavior: Behavior normal.      The results of significant diagnostics from this hospitalization (including imaging, microbiology, ancillary and laboratory) are listed below for reference.   Microbiology: Recent Results (from the past 240 hours)  Culture, Urine     Status: Abnormal   Collection Time: 09/19/23  3:15 PM   Specimen: Urine  Result Value Ref Range Status   MICRO NUMBER: [id number]  Final   SPECIMEN QUALITY: Adequate  Final   Sample Source URINE  Final   STATUS: FINAL  Final   ISOLATE 1: Klebsiella pneumoniae (A)  Final    Comment: Greater than 100,000 CFU/mL of Klebsiella pneumoniae      Susceptibility   Klebsiella pneumoniae - URINE CULTURE, REFLEX    AMOX/CLAVULANIC 8 Sensitive     AMPICILLIN/SULBACTAM >=32 Resistant     CEFAZOLIN  4 Intermediate     CEFTAZIDIME <=0.5 Sensitive     CEFEPIME <=0.12 Sensitive     CEFTRIAXONE  <=0.25 Sensitive     CIPROFLOXACIN >=4 Resistant     LEVOFLOXACIN >=8 Resistant     GENTAMICIN <=1 Sensitive     IMIPENEM <=0.25 Sensitive     MEROPENEM <=0.25 Sensitive     NITROFURANTOIN 64 Intermediate     PIP/TAZO <=4 Sensitive      TRIMETH /SULFA * >=  320 Resistant      * Legend: S = Susceptible  I = Intermediate R = Resistant  NS = Not susceptible SDD = Susceptible Dose Dependent * = Not Tested  NR = Not Reported **NN = See Therapy Comments   Blood culture (routine x 2)     Status: None (Preliminary result)   Collection Time: 09/21/23  3:55 PM   Specimen: BLOOD  Result Value Ref Range Status   Specimen Description   Final    BLOOD BLOOD LEFT ARM Performed at [facility], [address] [place], [place] [address]    Special Requests   Final    BOTTLES DRAWN AEROBIC ONLY Blood Culture results may not be optimal due to an inadequate volume of blood received in culture bottles Performed at [facility], [address] [place], [place] [address]    Culture   Final    NO GROWTH 3 DAYS Performed at [facility], [address] [place], [place] [address]    Report Status PENDING  Incomplete  Blood culture (routine x 2)     Status: None (Preliminary result)   Collection Time: 09/21/23  6:00 PM   Specimen: BLOOD  Result Value Ref Range Status   Specimen Description   Final    BLOOD BLOOD RIGHT ARM Performed at [facility], [address], [place], [place] [address]    Special Requests   Final    BOTTLES DRAWN AEROBIC AND ANAEROBIC Blood Culture adequate volume Performed at [facility], [address] [place], [place] [address]    Culture   Final    NO GROWTH 3 DAYS Performed at [facility], [address], [place], [place] [address]    Report Status PENDING  Incomplete  Urine Culture     Status: Abnormal   Collection Time: 09/21/23  8:30 PM   Specimen: Urine, Clean Catch  Result Value Ref Range Status   Specimen Description   Final    URINE, CLEAN CATCH Performed at [facility], [address] [place], [place] [address]    Special Requests   Final    NONE Performed at [facility], [address], [place], [place] [address]    Culture (A)  Final    <10,000 COLONIES/mL INSIGNIFICANT GROWTH Performed at [facility], [address], [place], [place] [address]    Report Status 09/23/2023 FINAL  Final     Labs: BNP (last 3 results) No results for input(s): BNP in the last 8760 hours. Basic Metabolic Panel: Recent Labs  Lab 09/19/23 1511 09/21/23 1159 09/22/23 0524 09/23/23 0503  NA 135 133* 134* 137  K 4.3 3.3* 3.2* 3.7  CL 99 99 104 109  CO2 28 22 22  20*  GLUCOSE 134* 134* 75 98  BUN 33* 45* 41* 33*  CREATININE 2.23* 2.08* 2.10* 1.61*  CALCIUM  9.1 8.8* 8.0* 8.1*  MG  --   --   --  2.3   Liver Function Tests: Recent Labs  Lab 09/19/23 1511 09/23/23 0503  AST 18 20  ALT 22 16  ALKPHOS 80 59  BILITOT 0.9 0.8  PROT 7.0 5.8*  ALBUMIN  3.8 2.5*   Recent Labs  Lab 09/19/23 1511  LIPASE 22.0   No results for input(s): AMMONIA in the last 168 hours. CBC: Recent Labs  Lab 09/19/23 1511 09/21/23 1159 09/22/23 0524 09/23/23 0503  WBC 16.4* 16.2* 11.0*  7.7  NEUTROABS 13.2*  --   --   --   HGB 13.9 13.1 11.9* 11.3*  HCT 42.6 40.6 36.3* 36.4*  MCV 102.7* 103.6* 105.5* 107.1*  PLT 191.0 147* 145* 156   Cardiac Enzymes: No results for input(s): CKTOTAL, CKMB, CKMBINDEX, TROPONINI in the last 168 hours. BNP: Invalid input(s): POCBNP CBG: Recent Labs  Lab 09/23/23 1133 09/23/23 1646 09/23/23 2159 09/24/23 0732 09/24/23 1109  GLUCAP 111* 123* 131* 77 152*   D-Dimer No results for input(s): DDIMER in the last 72 hours. Hgb A1c Recent Labs    09/21/23 1800  HGBA1C 6.1*   Lipid Profile No results for input(s): CHOL, HDL, LDLCALC, TRIG, CHOLHDL, LDLDIRECT in the last 72 hours. Thyroid  function studies No results for input(s): TSH, T4TOTAL, T3FREE, THYROIDAB in the last 72 hours.  Invalid input(s): FREET3 Anemia work up No results for input(s): VITAMINB12, FOLATE, FERRITIN,  TIBC, IRON, RETICCTPCT in the last 72 hours. Urinalysis    Component Value Date/Time   COLORURINE RED (A) 09/21/2023 1158   APPEARANCEUR CLEAR 09/21/2023 1158   LABSPEC 1.020 09/21/2023 1158   PHURINE 5.0 09/21/2023 1158   GLUCOSEU 150 (A) 09/21/2023 1158   GLUCOSEU 500 (A) 03/31/2023 1135   HGBUR NEGATIVE 09/21/2023 1158   BILIRUBINUR NEGATIVE 09/21/2023 1158   BILIRUBINUR pos 09/19/2023 1459   KETONESUR NEGATIVE 09/21/2023 1158   PROTEINUR 100 (A) 09/21/2023 1158   UROBILINOGEN 2.0 (A) 09/19/2023 1459   UROBILINOGEN 0.2 03/31/2023 1135   NITRITE POSITIVE (A) 09/21/2023 1158   LEUKOCYTESUR NEGATIVE 09/21/2023 1158   Sepsis Labs Recent Labs  Lab 09/19/23 1511 09/21/23 1159 09/22/23 0524 09/23/23 0503  WBC 16.4* 16.2* 11.0* 7.7   Microbiology Recent Results (from the past 240 hours)  Culture, Urine     Status: Abnormal   Collection Time: 09/19/23  3:15 PM   Specimen: Urine  Result Value Ref Range Status   MICRO NUMBER: [id number]  Final   SPECIMEN QUALITY: Adequate  Final   Sample Source URINE  Final   STATUS: FINAL  Final   ISOLATE 1: Klebsiella pneumoniae (A)  Final    Comment: Greater than 100,000 CFU/mL of Klebsiella pneumoniae      Susceptibility   Klebsiella pneumoniae - URINE CULTURE, REFLEX    AMOX/CLAVULANIC 8 Sensitive     AMPICILLIN/SULBACTAM >=32 Resistant     CEFAZOLIN  4 Intermediate     CEFTAZIDIME <=0.5 Sensitive     CEFEPIME <=0.12 Sensitive     CEFTRIAXONE  <=0.25 Sensitive     CIPROFLOXACIN >=4 Resistant     LEVOFLOXACIN >=8 Resistant     GENTAMICIN <=1 Sensitive     IMIPENEM <=0.25 Sensitive     MEROPENEM <=0.25 Sensitive     NITROFURANTOIN 64 Intermediate     PIP/TAZO <=4 Sensitive     TRIMETH /SULFA * >=320 Resistant      * Legend: S = Susceptible  I = Intermediate R = Resistant  NS = Not susceptible SDD = Susceptible Dose Dependent * = Not Tested  NR = Not Reported **NN = See Therapy Comments   Blood culture (routine x 2)      Status: None (Preliminary result)   Collection Time: 09/21/23  3:55 PM   Specimen: BLOOD  Result Value Ref Range Status   Specimen Description   Final    BLOOD BLOOD LEFT ARM Performed at [facility], [address], [place], [place] [address]    Special Requests   Final    BOTTLES DRAWN AEROBIC ONLY  Blood Culture results may not be optimal due to an inadequate volume of blood received in culture bottles Performed at [facility], [address] [place], [place] [address]    Culture   Final    NO GROWTH 3 DAYS Performed at [facility], [address], [place], [place] [address]    Report Status PENDING  Incomplete  Blood culture (routine x 2)     Status: None (Preliminary result)   Collection Time: 09/21/23  6:00 PM   Specimen: BLOOD  Result Value Ref Range Status   Specimen Description   Final    BLOOD BLOOD RIGHT ARM Performed at [facility] [place], [address], [place], [place] [address]    Special Requests   Final    BOTTLES DRAWN AEROBIC AND ANAEROBIC Blood Culture adequate volume Performed at [facility], [address], [place], [place] [address]    Culture   Final    NO GROWTH 3 DAYS Performed at [facility], [address] [place], [place] [address]    Report Status PENDING  Incomplete  Urine Culture     Status: Abnormal   Collection Time: 09/21/23  8:30 PM   Specimen: Urine, Clean Catch  Result Value Ref Range Status   Specimen Description   Final    URINE, CLEAN CATCH Performed at [facility], [address], [place], [place] [address]    Special Requests   Final    NONE Performed at [facility], [address] [place], [place] [address]    Culture (A)  Final    <10,000 COLONIES/mL INSIGNIFICANT GROWTH Performed at [facility], [address], [place], [place] [address]    Report Status 09/23/2023 FINAL  Final     Procedures/Studies: CT Renal Stone Study Result Date: 09/21/2023 CLINICAL DATA:  Abdominal pain stone suspected, history of prostate cancer status post prostatectomy left iliac EXAM: CT ABDOMEN AND PELVIS WITHOUT CONTRAST TECHNIQUE: Multidetector CT imaging of the abdomen and pelvis was performed following the standard protocol without IV contrast. RADIATION DOSE REDUCTION: This exam was performed according to the departmental dose-optimization program which includes automated exposure control, adjustment of the mA and/or kV according to patient size and/or use of iterative reconstruction technique. COMPARISON:  None Available. FINDINGS: Limited evaluation of metastatic disease given the lack of contrast. Lower chest: Status post TAVR. No suspicious nodule. Normal heart size. Atherosclerotic calcifications of coronary arteries. Hepatobiliary: No focal liver abnormality is seen. No gallstones, gallbladder wall thickening, or biliary dilatation. Pancreas: Unremarkable. No pancreatic ductal dilatation or surrounding inflammatory changes. Spleen: Normal in size without focal abnormality. Adrenals/Urinary Tract: Adrenal glands are unremarkable. Right kidney hypodense cortical lesions up to 2.8 cm which does not require imaging follow-up, likely representing simple cortical cysts. Left kidney hypodense cortical lesion measuring 5 mm likely a hyperdense cyst containing proteinaceous material/blood products. No nephrolithiasis or hydronephrosis. Kidneys are normal, without renal calculi, focal lesion, or hydronephrosis. Bladder is unremarkable. Stomach/Bowel: Stomach is within normal limits. Appendix appears normal. Diverticulosis without diverticulitis. No evidence of bowel wall thickening, distention, or inflammatory changes. Vascular/Lymphatic: No enlarged abdominal or pelvic lymph nodes.Atherosclerotic calcifications of aorta Reproductive:  Surgical clips from prior prostatectomy. Other: Tiny fat containing  umbilical hernia. No abdominopelvic ascites. Musculoskeletal: Left iliac bone sclerotic lesion measuring 2.4 x 1 cm stable to 2017. Multilevel degenerative changes of the spine. Posterior spinal fusion device in lower lumbar spine. IMPRESSION: No hydronephrosis or  nephrolithiasis. Bilateral simple and hyperdense renal cortical cysts which does not require imaging follow-up. Prior prostatectomy.  No pelvic mass or lymphadenopathy. Colonic diverticulosis without diverticulitis. Atherosclerotic calcifications of aorta and coronary arteries. Stable sclerotic lesion of left iliac bone. Electronically Signed   By: Megan  Zare M.D.   On: 09/21/2023 15:25     Time coordinating discharge: Over 30 minutes    Alm Apo, MD  [facility] 09/24/2023, 1:22 PM

## 2023-09-24 NOTE — Plan of Care (Signed)

## 2023-09-24 NOTE — Progress Notes (Signed)
 Mobility Specialist - Progress Note   09/24/23 1046  Mobility  Activity Ambulated with assistance  Level of Assistance Independent after set-up  Assistive Device Front wheel walker  Distance Ambulated (ft) 500 ft  Range of Motion/Exercises Active  Activity Response Tolerated well  Mobility Referral Yes  Mobility visit 1 Mobility  Mobility Specialist Start Time (ACUTE ONLY) 1034  Mobility Specialist Stop Time (ACUTE ONLY) 1045  Mobility Specialist Time Calculation (min) (ACUTE ONLY) 11 min   Received in bed and agreed to mobility. Had no issues throughout session, returned to bed with all needs met.  Cyndee Ada Mobility Specialist

## 2023-09-26 LAB — CULTURE, BLOOD (ROUTINE X 2)
Culture: NO GROWTH
Culture: NO GROWTH
Special Requests: ADEQUATE

## 2023-09-28 DIAGNOSIS — Z8546 Personal history of malignant neoplasm of prostate: Secondary | ICD-10-CM | POA: Diagnosis not present

## 2023-09-28 DIAGNOSIS — N35012 Post-traumatic membranous urethral stricture: Secondary | ICD-10-CM | POA: Diagnosis not present

## 2023-10-02 ENCOUNTER — Other Ambulatory Visit: Payer: Self-pay | Admitting: Gastroenterology

## 2023-10-03 DIAGNOSIS — N1832 Chronic kidney disease, stage 3b: Secondary | ICD-10-CM | POA: Diagnosis not present

## 2023-10-04 ENCOUNTER — Ambulatory Visit
Admission: EM | Admit: 2023-10-04 | Discharge: 2023-10-04 | Disposition: A | Discharge status: Home / Self Care | Attending: Physician Assistant | Admitting: Physician Assistant

## 2023-10-04 ENCOUNTER — Encounter: Payer: Self-pay | Admitting: Emergency Medicine

## 2023-10-04 ENCOUNTER — Ambulatory Visit: Payer: Self-pay | Admitting: *Deleted

## 2023-10-04 DIAGNOSIS — R300 Dysuria: Secondary | ICD-10-CM | POA: Diagnosis present

## 2023-10-04 DIAGNOSIS — Z87440 Personal history of urinary (tract) infections: Secondary | ICD-10-CM | POA: Insufficient documentation

## 2023-10-04 DIAGNOSIS — R350 Frequency of micturition: Secondary | ICD-10-CM | POA: Diagnosis present

## 2023-10-04 DIAGNOSIS — N390 Urinary tract infection, site not specified: Secondary | ICD-10-CM | POA: Insufficient documentation

## 2023-10-04 LAB — POCT URINE DIPSTICK
Bilirubin, UA: NEGATIVE
Blood, UA: NEGATIVE
Glucose, UA: NEGATIVE mg/dL
Ketones, POC UA: NEGATIVE mg/dL
Leukocytes, UA: NEGATIVE
Nitrite, UA: POSITIVE — AB
POC PROTEIN,UA: 100 — AB
Spec Grav, UA: 1.025 (ref 1.010–1.025)
Urobilinogen, UA: 1 U/dL
pH, UA: 5 (ref 5.0–8.0)

## 2023-10-04 MED ORDER — CEFPODOXIME PROXETIL 200 MG PO TABS: 200.0000 mg | ORAL_TABLET | Freq: Two times a day (BID) | ORAL | 0 refills | Status: DC

## 2023-10-04 NOTE — Telephone Encounter (Addendum)
 FYI Only or Action Required?: FYI only for provider.  Patient was last seen in primary care on 09/19/2023 by Daryl Clotilda SAUNDERS, MD.  Called Nurse Triage reporting urinary symptoms.  Symptoms began yesterday.  Interventions attempted: Nothing.  Symptoms are: gradually worsening.  Triage Disposition: See HCP Within 4 Hours (Or PCP Triage)  Patient/caregiver understands and will follow disposition?: yes- no open appointment- patient to call urologist/UC as next option    Reason for Disposition  [1] SEVERE pain with urination (e.g., excruciating) AND [2] not improved after 2 hours of pain medicine (e.g., acetaminophen  or ibuprofen)  Answer Assessment - Initial Assessment Questions 1. SEVERITY: How bad is the pain?  (e.g., Scale 1-10; mild, moderate, or severe)     5/10 2. FREQUENCY: How many times have you had painful urination today?      4am- started today 3. PATTERN: Is pain present every time you urinate or just sometimes?      Every time 4. ONSET: When did the painful urination start?      Started today 5. FEVER: Do you have a fever? If Yes, ask: What is your temperature, how was it measured, and when did it start?     no 6. PAST UTI: Have you had a urine infection before? If Yes, ask: When was the last time? and What happened that time?      Recent hospitalization for UTI 7. CAUSE: What do you think is causing the painful urination?      Residual UTI 8. OTHER SYMPTOMS: Do you have any other symptoms? (e.g., flank pain, penis discharge, scrotal pain, blood in urine)     no  Protocols used: Urination Pain - Male-A-AH   Copied from CRM #[id number]. Topic: Clinical - Red Word Triage >> Oct 04, 2023  8:09 AM Zy'onna H wrote: Red Word that prompted transfer to Nurse Triage:  Hospitalized for UTI  Between 8/13-8/20 - Patient then saw a Urologist but is continuing to have symptoms.   Took a Culture at the Hospital and Prescribed  Antibiotics  After his  hospital stay the UTI has come back.   Pain to Urinate  Frequency/Recurring  Warm Transferring to NT

## 2023-10-04 NOTE — ED Triage Notes (Signed)
 Pt has recurrent uti's. Presents today due to urinary burning and frequency that began last night. Pt just finished abx from last hospital admin on the 20th.

## 2023-10-04 NOTE — ED Provider Notes (Signed)
 [facility]    CSN: [id number] Arrival date & time: 10/04/23  0946      History   Chief Complaint Chief Complaint  Patient presents with   uti    HPI Daryl Webb is a 78 y.o. male.   Patient complains of burning with urination.  Patient has a history of frequent UTIs.  Patient is scheduled to have a cystoscopy by urology.  Patient recently finished a course of antibiotics.  Patient complains of discomfort when he urinates.  Patient is here with his wife who provides history.  She asked if patient can have a culture done.  Reviewing patient's chart he has had multidrug-resistant UTIs in the past.  He recently responded well to Vantin .  Patient has not had any fever or chills he denies any nausea or vomiting he is not having any abdominal pain he  The history is provided by the patient and the spouse. No language interpreter was used.    Past Medical History:  Diagnosis Date   Acute blood loss anemia 06/27/2012   Anemia    Arthritis    Blood transfusion without reported diagnosis    CKD (chronic kidney disease)    Clotting disorder (HCC) 11/20/21   Bleeding ulcer   Constipation due to pain medication    needs stool softner while on pain medication   Diabetes mellitus    Duodenal ulcer 08/13/2015   2014  - Dr Kaplan/GI   Duodenal ulcer with hemorrhage 08/13/2015   2014  - Dr Kaplan/GI   Dyspnea 06/19/2012   GERD (gastroesophageal reflux disease)    H/O: osteoarthritis    Heart murmur    History of kidney stones    History of prostate cancer    History of urethral stricture    Hypercholesterolemia    Hypertension    Melena 06/19/2012   PONV (postoperative nausea and vomiting)    Prostate cancer (HCC)    prostate   S/P TAVR (transcatheter aortic valve replacement) 08/12/2020   s/p TAVR with a 29 mm Edwards Sapien 3 THV via the TF approach by Dr. Wonda and Dr. Lucas   Severe aortic stenosis    Spinal stenosis    Ulcer duodenal hemorrhage      Patient Active Problem List   Diagnosis Date Noted   Hypokalemia 09/24/2023   Hyponatremia 09/24/2023   Macrocytic anemia 09/24/2023   Thrombocytopenia (HCC) 09/24/2023   Urinary retention 09/24/2023   History of iron deficiency anemia 05/29/2022   History of duodenal ulcer 05/29/2022   Symptomatic anemia 11/29/2021   Alzheimer's dementia with mood disturbance (HCC) 11/21/2021   GI bleeding 11/20/2021   Insomnia 04/22/2021   S/P TAVR (transcatheter aortic valve replacement) 08/12/2020   Severe aortic stenosis    Atherosclerosis of aorta (HCC) 05/19/2020   Stage 3b chronic kidney disease (HCC) 07/03/2017   Flank pain 02/12/2016   Essential hypertension 02/12/2016   Dyslipidemia (high LDL; low HDL) 01/19/2016   PUD (peptic ulcer disease) 01/19/2016   Vertigo 05/02/2014   Low back pain 07/20/2012   Type 2 diabetes mellitus with renal manifestations (HCC) 09/01/2011   History of prostate cancer    History of urethral stricture    Generalized osteoarthritis of multiple sites     Past Surgical History:  Procedure Laterality Date   ARTHROPLASTY  2004   right knee Dr. Melodi SANES SURGERY     BIOPSY  11/21/2021   Procedure: BIOPSY;  Surgeon: Legrand Victory LITTIE DOUGLAS, MD;  Location:  [facility];  Service: Gastroenterology;;   CARDIAC CATHETERIZATION     CARDIAC VALVE REPLACEMENT     CARPAL TUNNEL RELEASE     right hand,wrist Dr. Leonor   CYSTOSCOPY N/A 09/20/2012   Procedure: CYSTOSCOPY;  Surgeon: Noretta Ferrara, MD;  Location: [facility];  Service: Urology;  Laterality: N/A;   ESOPHAGOGASTRODUODENOSCOPY N/A 11/21/2021   Procedure: ESOPHAGOGASTRODUODENOSCOPY (EGD);  Surgeon: Legrand Victory LITTIE DOUGLAS, MD;  Location: [facility];  Service: Gastroenterology;  Laterality: N/A;   ESOPHAGOGASTRODUODENOSCOPY (EGD) WITH PROPOFOL  N/A 11/30/2021   Procedure: ESOPHAGOGASTRODUODENOSCOPY (EGD) WITH PROPOFOL ;  Surgeon: Albertus Gordy HERO, MD;  Location: [facility];  Service: Gastroenterology;   Laterality: N/A;   HEMOSTASIS CLIP PLACEMENT  11/30/2021   Procedure: HEMOSTASIS CLIP PLACEMENT;  Surgeon: Albertus Gordy HERO, MD;  Location: [facility];  Service: Gastroenterology;;   INGUINAL HERNIA REPAIR Right 01/14/2022   Procedure: RIGHT OPEN INGUINAL HERNIA REPAIR WITH MESH;  Surgeon: Kinsinger, Herlene Righter, MD;  Location: [facility];  Service: General;  Laterality: Right;   JOINT REPLACEMENT Right    KNEE ARTHROSCOPY Right    LUMBAR LAMINECTOMY  02/08/2013   PROSTATECTOMY     radical   RIGHT/LEFT HEART CATH AND CORONARY ANGIOGRAPHY N/A 05/08/2020   Procedure: RIGHT/LEFT HEART CATH AND CORONARY ANGIOGRAPHY;  Surgeon: Wonda Sharper, MD;  Location: [facility] INVASIVE CV LAB;  Service: Cardiovascular;  Laterality: N/A;   SPINE SURGERY     TEE WITHOUT CARDIOVERSION N/A 08/12/2020   Procedure: TRANSESOPHAGEAL ECHOCARDIOGRAM (TEE);  Surgeon: Wonda Sharper, MD;  Location: [facility] INVASIVE CV LAB;  Service: Open Heart Surgery;  Laterality: N/A;   TOOTH EXTRACTION  12/2021   TRANSCATHETER AORTIC VALVE REPLACEMENT, TRANSFEMORAL N/A 08/12/2020   Procedure: TRANSCATHETER AORTIC VALVE REPLACEMENT, TRANSFEMORAL;  Surgeon: Wonda Sharper, MD;  Location: [facility] INVASIVE CV LAB;  Service: Open Heart Surgery;  Laterality: N/A;   VASECTOMY  1985       Home Medications    Prior to Admission medications   Medication Sig Start Date End Date Taking? Authorizing Provider  cefpodoxime  (VANTIN ) 200 MG tablet Take 1 tablet (200 mg total) by mouth 2 (two) times daily. 10/04/23  Yes Hong Moring K, PA-C  amLODipine  (NORVASC ) 2.5 MG tablet TAKE 1 TABLET BY MOUTH EVERY DAY Patient taking differently: Take 2.5 mg by mouth in the morning. 04/25/23   [place], [name], MD  atorvastatin  (LIPITOR) 40 MG tablet Take 1 tablet (40 mg total) by mouth daily. Patient taking differently: Take 40 mg by mouth at bedtime. 12/15/22   Hilty, Vinie BROCKS, MD  BD PEN NEEDLE NANO 2ND GEN 32G X 4 MM MISC daily. 11/22/22   [provider]   calcium  carbonate (TUMS - DOSED IN MG ELEMENTAL CALCIUM ) 500 MG chewable tablet Chew 2 tablets by mouth daily as needed for indigestion or heartburn.    [provider]  Cholecalciferol (VITAMIN D3) 1000 units CAPS Take 2,000 Units by mouth daily. 01/14/22   Kinsinger, Herlene Righter, MD  citalopram  (CELEXA ) 10 MG tablet TAKE 1 TABLET BY MOUTH EVERY DAY Patient taking differently: Take 10 mg by mouth in the morning. 04/06/23   Camara, Amadou, MD  Coenzyme Q10 (COQ10) 100 MG CAPS Take 100 mg by mouth daily.    [provider]  Continuous Glucose Receiver (DEXCOM G7 RECEIVER) DEVI use to monitor blood sugar for 365 days 08/27/22   [provider]  Continuous Glucose Sensor (DEXCOM G7 SENSOR) MISC USE TO CHECK SUGAR CONTINUOUSLY Patient taking differently: Inject 1 Device into the skin  See admin instructions. Place 1 new sensor into the skin every 10-11 days 08/31/23   [place], [name], MD  CRANBERRY PO Take 1 tablet by mouth in the morning.    [provider]  donepezil  (ARICEPT ) 10 MG tablet Take 1 tablet (10 mg total) by mouth at bedtime. 03/14/23 03/08/24  Gregg Lek, MD  ferrous gluconate  (FERGON) 324 MG tablet TAKE 1 TABLET BY MOUTH EVERY DAY WITH BREAKFAST Patient taking differently: Take 324 mg by mouth daily with breakfast. 09/08/23   Mansouraty, Gabriel Jr., MD  glucose 4 GM chewable tablet Chew 1 tablet by mouth as needed for low blood sugar.    [provider]  Insulin  Glargine (BASAGLAR  KWIKPEN) 100 UNIT/ML Inject 36 Units into the skin daily after lunch. 03/02/22   [provider]  losartan -hydrochlorothiazide  (HYZAAR) 100-25 MG tablet Take 1 tablet by mouth in the morning. 02/26/22   [provider]  Melatonin 5 MG CHEW Chew 15 mg by mouth at bedtime.    [provider]  metoprolol  succinate (TOPROL -XL) 50 MG 24 hr tablet TAKE 1 TABLET DAILY, WITH  OR IMMEDIATELY FOLLOWING A MEAL Patient taking differently: Take 50 mg by  mouth daily after breakfast. 12/13/22   Hilty, Vinie BROCKS, MD  NON FORMULARY Take 4 tablets by mouth See admin instructions. Focus Factor tablets - Take 4 tablets by mouth in the morning with breakfast    [provider]  OZEMPIC, 0.25 OR 0.5 MG/DOSE, 2 MG/3ML SOPN Inject 0.5 mg into the skin every Sunday.    [provider]  pantoprazole  (PROTONIX ) 40 MG tablet TAKE 1 TABLET BY MOUTH EVERY DAY Patient taking differently: Take 20 mg by mouth every other day. 08/10/23   Mansouraty, Aloha Raddle., MD  TYLENOL  500 MG tablet Take 1,000 mg by mouth every 6 (six) hours as needed (for headaches).    [provider]    Family History Family History  Problem Relation Age of Onset   Heart disease Mother    Heart failure Mother    Heart disease Father    Diabetes Father    Colon cancer Neg Hx    Esophageal cancer Neg Hx    Inflammatory bowel disease Neg Hx    Liver disease Neg Hx    Pancreatic cancer Neg Hx    Rectal cancer Neg Hx    Stomach cancer Neg Hx     Social History Social History   Tobacco Use   Smoking status: Former    Current packs/day: 0.00    Average packs/day: 1.5 packs/day for 20.0 years (30.0 ttl pk-yrs)    Types: Cigarettes    Start date: 02/09/1960    Quit date: 02/09/1980    Years since quitting: 43.6   Smokeless tobacco: Never  Vaping Use   Vaping status: Never Used  Substance Use Topics   Alcohol use: No   Drug use: No     Allergies   Aspirin , Diclofenac , Excedrin tension headache [acetaminophen -caffeine], Motrin [ibuprofen], and Nsaids   Review of Systems Review of Systems  All other systems reviewed and are negative.    Physical Exam Triage Vital Signs ED Triage Vitals  Encounter Vitals Group     BP 10/04/23 1004 113/68     Girls Systolic BP Percentile --      Girls Diastolic BP Percentile --      Boys Systolic BP Percentile --      Boys Diastolic BP Percentile --      Pulse Rate 10/04/23  1004 78     Resp 10/04/23 1004 16      Temp 10/04/23 1004 97.7 F (36.5 C)     Temp src --      SpO2 10/04/23 1004 93 %     Weight --      Height --      Head Circumference --      Peak Flow --      Pain Score 10/04/23 0958 0     Pain Loc --      Pain Education --      Exclude from Growth Chart --    No data found.  Updated Vital Signs BP 113/68 (BP Location: Right Arm)   Pulse 78   Temp 97.7 F (36.5 C)   Resp 16   SpO2 93%   Visual Acuity Right Eye Distance:   Left Eye Distance:   Bilateral Distance:    Right Eye Near:   Left Eye Near:    Bilateral Near:     Physical Exam Vitals and nursing note reviewed.  Constitutional:      Appearance: He is well-developed.  HENT:     Head: Normocephalic.  Cardiovascular:     Rate and Rhythm: Normal rate.  Pulmonary:     Effort: Pulmonary effort is normal.  Abdominal:     General: There is no distension.  Musculoskeletal:        General: Normal range of motion.     Cervical back: Normal range of motion.  Skin:    General: Skin is warm.  Neurological:     General: No focal deficit present.     Mental Status: He is alert and oriented to person, place, and time.      UC Treatments / Results  Labs (all labs ordered are listed, but only abnormal results are displayed) Labs Reviewed  POCT URINE DIPSTICK - Abnormal; Notable for the following components:      Result Value   Color, UA orange (*)    POC PROTEIN,UA =100 (*)    Nitrite, UA Positive (*)    All other components within normal limits  URINE CULTURE    EKG   Radiology No results found.  Procedures Procedures (including critical care time)  Medications Ordered in UC Medications - No data to display  Initial Impression / Assessment and Plan / UC Course  I have reviewed the triage vital signs and the nursing notes.  Pertinent labs & imaging results that were available during my care of the patient were reviewed by me and considered in my medical decision making (see chart for  details).     UA shows nitrates urine culture is ordered.  Patient is given a prescription for Vantin  as this has provided coverage in the past.  Patient is advised if he has fever chills vomiting to go to the emergency department for evaluation follow-up with urology as scheduled Final Clinical Impressions(s) / UC Diagnoses   Final diagnoses:  Urinary tract infection without hematuria, site unspecified     Discharge Instructions      Your culture is pending.  Go to the Emergency department if symptoms worsen or change   ED Prescriptions     Medication Sig Dispense Auth. Provider   cefpodoxime  (VANTIN ) 200 MG tablet Take 1 tablet (200 mg total) by mouth 2 (two) times daily. 20 tablet Tremeka Helbling K, PA-C      PDMP not reviewed this encounter. An After Visit Summary was printed and given to the  patient.       Flint Sonny POUR, PA-C 10/04/23 1046

## 2023-10-04 NOTE — Discharge Instructions (Signed)
 Your culture is pending.  Go to the Emergency department if symptoms worsen or change

## 2023-10-05 LAB — URINE CULTURE: Culture: <10000 — AB

## 2023-10-06 ENCOUNTER — Ambulatory Visit (HOSPITAL_COMMUNITY): Payer: Self-pay

## 2023-10-07 ENCOUNTER — Other Ambulatory Visit: Payer: Self-pay | Admitting: Gastroenterology

## 2023-10-11 DIAGNOSIS — C44321 Squamous cell carcinoma of skin of nose: Secondary | ICD-10-CM | POA: Diagnosis not present

## 2023-10-11 DIAGNOSIS — Z85828 Personal history of other malignant neoplasm of skin: Secondary | ICD-10-CM | POA: Diagnosis not present

## 2023-10-14 DIAGNOSIS — E1122 Type 2 diabetes mellitus with diabetic chronic kidney disease: Secondary | ICD-10-CM | POA: Diagnosis not present

## 2023-10-14 DIAGNOSIS — F0280 Dementia in other diseases classified elsewhere without behavioral disturbance: Secondary | ICD-10-CM | POA: Diagnosis not present

## 2023-10-14 DIAGNOSIS — I129 Hypertensive chronic kidney disease with stage 1 through stage 4 chronic kidney disease, or unspecified chronic kidney disease: Secondary | ICD-10-CM | POA: Diagnosis not present

## 2023-10-14 DIAGNOSIS — G309 Alzheimer's disease, unspecified: Secondary | ICD-10-CM | POA: Diagnosis not present

## 2023-10-14 DIAGNOSIS — N1832 Chronic kidney disease, stage 3b: Secondary | ICD-10-CM | POA: Diagnosis not present

## 2023-10-15 ENCOUNTER — Other Ambulatory Visit: Payer: Self-pay | Admitting: Neurology

## 2023-10-20 DIAGNOSIS — H524 Presbyopia: Secondary | ICD-10-CM | POA: Diagnosis not present

## 2023-10-20 DIAGNOSIS — E119 Type 2 diabetes mellitus without complications: Secondary | ICD-10-CM | POA: Diagnosis not present

## 2023-10-20 DIAGNOSIS — H2513 Age-related nuclear cataract, bilateral: Secondary | ICD-10-CM | POA: Diagnosis not present

## 2023-10-21 ENCOUNTER — Other Ambulatory Visit: Payer: Self-pay

## 2023-10-21 DIAGNOSIS — E1122 Type 2 diabetes mellitus with diabetic chronic kidney disease: Secondary | ICD-10-CM

## 2023-10-21 MED ORDER — DEXCOM G7 SENSOR MISC: 1.0000 | 3 refills | Status: DC

## 2023-10-26 ENCOUNTER — Other Ambulatory Visit: Payer: Self-pay | Admitting: Family Medicine

## 2023-10-26 DIAGNOSIS — E1122 Type 2 diabetes mellitus with diabetic chronic kidney disease: Secondary | ICD-10-CM

## 2023-10-26 NOTE — Telephone Encounter (Unsigned)
 Copied from CRM [id number]. Topic: Clinical - Medication Refill >> Oct 26, 2023  4:31 PM [name] C wrote: Medication: Continuous Glucose Sensor (DEXCOM G7 SENSOR) MISC   Has the patient contacted their pharmacy? Yes (Agent: If no, request that the patient contact the pharmacy for the refill. If patient does not wish to contact the pharmacy document the reason why and proceed with request.) (Agent: If yes, when and what did the pharmacy advise?)  This is the patient's preferred pharmacy:  [id number] [address] [facility] - [address] [place] [address] Phone: [phone] Fax: [phone]  Is this the correct pharmacy for this prescription? Yes If no, delete pharmacy and type the correct one.   Has the prescription been filled recently? Yes  Is the patient out of the medication? No, very soon  Has the patient been seen for an appointment in the last year OR does the patient have an upcoming appointment? Yes  Can we respond through MyChart? Yes  Agent: Please be advised that Rx refills may take up to 3 business days. We ask that you follow-up with your pharmacy.

## 2023-10-27 MED ORDER — DEXCOM G7 SENSOR MISC: 1.0000 | 3 refills | Status: DC

## 2023-10-28 ENCOUNTER — Telehealth: Payer: Self-pay | Admitting: *Deleted

## 2023-10-28 NOTE — Telephone Encounter (Signed)
 Copied from CRM #[id number]. Topic: Clinical - Prescription Issue >> Oct 28, 2023 10:10 AM Harlene ORN wrote: Reason for CRM: Wife of patient called. Her husband's Medicare is requesting for PCP documentation to confirm why he needs Dexcom 7. Medicare Insurance Fax: [phone]  If you have any more questions, please call back the wife to discuss: [phone]

## 2023-10-31 ENCOUNTER — Other Ambulatory Visit (HOSPITAL_COMMUNITY): Payer: Self-pay

## 2023-10-31 ENCOUNTER — Telehealth: Payer: Self-pay | Admitting: Family Medicine

## 2023-10-31 NOTE — Telephone Encounter (Signed)
 Per test claim, Dexcom is not covered under Part D. Called pharmacy to try to run prescription under Part B. Per technician, claim rejected due to missing documentation/medical records. Documentation can be faxed to [phone]. PA team can only run test claims and PA's for Part D.

## 2023-10-31 NOTE — Telephone Encounter (Signed)
 Patient needs documentation from Dr. [place] stating he is on insulin  and has Alzheimer's and can not but will not remember to prick his finger.  [facility] [address]

## 2023-10-31 NOTE — Telephone Encounter (Signed)
 This request needs to be sent to his endocrinologist. Thanks, BJ

## 2023-11-01 NOTE — Telephone Encounter (Signed)
 Request has been sent to Dr. Faythe with Endocrinology

## 2023-11-02 ENCOUNTER — Ambulatory Visit: Payer: Self-pay

## 2023-11-02 ENCOUNTER — Other Ambulatory Visit: Payer: Self-pay | Admitting: Gastroenterology

## 2023-11-02 NOTE — Telephone Encounter (Signed)
 FYI Only or Action Required?: FYI only for provider.  Patient was last seen in primary care on 09/19/2023 by Mercer Clotilda SAUNDERS, MD.  Called Nurse Triage reporting Dysuria.  Symptoms began today.  Interventions attempted: Nothing.  Symptoms are: unchanged.  Triage Disposition: See Physician Within 24 Hours  Patient/caregiver understands and will follow disposition?: Yes       Copied from CRM #[id number]. Topic: Clinical - Red Word Triage >> Nov 02, 2023  1:46 PM Robinson H wrote: [facility] that prompted transfer to Nurse Triage: UTI, burning and pain while urinating       Reason for Disposition  All other males with painful urination  Answer Assessment - Initial Assessment Questions 1. SEVERITY: How bad is the pain?  (e.g., Scale 1-10; mild, moderate, or severe)     Mild 2. FREQUENCY: How many times have you had painful urination today?      Once 3. PATTERN: Is pain present every time you urinate or just sometimes?      Happened once  4. ONSET: When did the painful urination start?      Today  5. FEVER: Do you have a fever? If Yes, ask: What is your temperature, how was it measured, and when did it start?     No 6. PAST UTI: Have you had a urine infection before? If Yes, ask: When was the last time? and What happened that time?       Yes, history of UTI 7. CAUSE: What do you think is causing the painful urination?      Possible UTI 8. OTHER SYMPTOMS: Do you have any other symptoms? (e.g., flank pain, penis discharge, scrotal pain, blood in urine)     No  Protocols used: Urination Pain - Male-A-AH

## 2023-11-03 ENCOUNTER — Encounter: Payer: Self-pay | Admitting: Adult Health

## 2023-11-03 ENCOUNTER — Ambulatory Visit (INDEPENDENT_AMBULATORY_CARE_PROVIDER_SITE_OTHER): Admitting: Adult Health

## 2023-11-03 VITALS — BP 95/60 | HR 69 | Temp 97.7°F | Ht 65.0 in | Wt 159.0 lb

## 2023-11-03 DIAGNOSIS — R300 Dysuria: Secondary | ICD-10-CM

## 2023-11-03 DIAGNOSIS — N3001 Acute cystitis with hematuria: Secondary | ICD-10-CM

## 2023-11-03 LAB — POCT URINALYSIS DIPSTICK
Blood, UA: POSITIVE
Glucose, UA: NEGATIVE
Ketones, UA: NEGATIVE
Nitrite, UA: POSITIVE
Protein, UA: POSITIVE — AB
Spec Grav, UA: 1.02 (ref 1.010–1.025)
Urobilinogen, UA: 1 U/dL
pH, UA: 6 (ref 5.0–8.0)

## 2023-11-03 MED ORDER — CEFPODOXIME PROXETIL 200 MG PO TABS: 200.0000 mg | ORAL_TABLET | Freq: Two times a day (BID) | ORAL | 0 refills | Status: AC

## 2023-11-03 NOTE — Progress Notes (Signed)
 Subjective:    Patient ID: Daryl Webb, male    DOB: [birth year], 78 y.o.   MRN: [id number]  HPI 78 year old male who  has a past medical history of Acute blood loss anemia (06/27/2012), Anemia, Arthritis, Blood transfusion without reported diagnosis, CKD (chronic kidney disease), Clotting disorder (11/20/21), Constipation due to pain medication, Diabetes mellitus, Duodenal ulcer (08/13/2015), Duodenal ulcer with hemorrhage (08/13/2015), Dyspnea (06/19/2012), GERD (gastroesophageal reflux disease), H/O: osteoarthritis, Heart murmur, History of kidney stones, History of prostate cancer, History of urethral stricture, Hypercholesterolemia, Hypertension, Melena (06/19/2012), PONV (postoperative nausea and vomiting), Prostate cancer (HCC), S/P TAVR (transcatheter aortic valve replacement) (08/12/2020), Severe aortic stenosis, Spinal stenosis, and Ulcer duodenal hemorrhage.  Presents to the office with his wife for concern of a UTI.  He reports 1 day of dysuria, urinary frequency and urgency.  He denies fevers, chills, low back pain and lower pelvic pain.  He does have a history of urinary tract infections and sees Urology.  He has known history of urinary stricture in which he had to self cath but is no longer doing.  He is scheduled for a cystoscopy in the future.   His most recent UTI was 09/19/2023 and at this time he was prescribed Augmentin . Unfortunanyl he had failure to this antibiotic and ended up in the hospital a couple of days later for his UTI. He was discharged with Vantin  BID x 7 days. He resistance to multiple drugs on last urine culture   Review of Systems See HPI   Past Medical History:  Diagnosis Date   Acute blood loss anemia 06/27/2012   Anemia    Arthritis    Blood transfusion without reported diagnosis    CKD (chronic kidney disease)    Clotting disorder 11/20/21   Bleeding ulcer   Constipation due to pain medication    needs stool softner while on pain medication    Diabetes mellitus    Duodenal ulcer 08/13/2015   2014  - Dr Kaplan/GI   Duodenal ulcer with hemorrhage 08/13/2015   2014  - Dr Kaplan/GI   Dyspnea 06/19/2012   GERD (gastroesophageal reflux disease)    H/O: osteoarthritis    Heart murmur    History of kidney stones    History of prostate cancer    History of urethral stricture    Hypercholesterolemia    Hypertension    Melena 06/19/2012   PONV (postoperative nausea and vomiting)    Prostate cancer (HCC)    prostate   S/P TAVR (transcatheter aortic valve replacement) 08/12/2020   s/p TAVR with a 29 mm Edwards Sapien 3 THV via the TF approach by Dr. Wonda and Dr. Lucas   Severe aortic stenosis    Spinal stenosis    Ulcer duodenal hemorrhage     Social History   Socioeconomic History   Marital status: Married    Spouse name: Not on file   Number of children: 2   Years of education: 16   Highest education level: Bachelor's degree (e.g., BA, AB, BS)  Occupational History   Occupation: Retired  Tobacco Use   Smoking status: Former    Current packs/day: 0.00    Average packs/day: 1.5 packs/day for 20.0 years (30.0 ttl pk-yrs)    Types: Cigarettes    Start date: 02/09/1960    Quit date: 02/09/1980    Years since quitting: 43.7   Smokeless tobacco: Never  Vaping Use   Vaping status: Never Used  Substance and Sexual Activity  Alcohol use: No   Drug use: No   Sexual activity: Not Currently  Other Topics Concern   Not on file  Social History Narrative   Retired from Engineer, structural (boats).    Lives with wife,    two children in [place].    Enjoys reading, boating, travelling to [place]  in the winter with wife.   Social Drivers of Corporate investment banker Strain: Low Risk  (12/24/2021)   Overall Financial Resource Strain (CARDIA)    Difficulty of Paying Living Expenses: Not hard at all  Food Insecurity: No Food Insecurity (09/21/2023)   Hunger Vital Sign    Worried About Running Out of Food in the Last Year:  Never true    Ran Out of Food in the Last Year: Never true  Transportation Needs: No Transportation Needs (09/21/2023)   PRAPARE - Administrator, Civil Service (Medical): No    Lack of Transportation (Non-Medical): No  Physical Activity: Inactive (12/24/2021)   Exercise Vital Sign    Days of Exercise per Week: 0 days    Minutes of Exercise per Session: 0 min  Stress: No Stress Concern Present (12/24/2021)   [employer] of Occupational Health - Occupational Stress Questionnaire    Feeling of Stress : Not at all  Social Connections: Moderately Isolated (09/21/2023)   Social Connection and Isolation Panel    Frequency of Communication with Friends and Family: More than three times a week    Frequency of Social Gatherings with Friends and Family: More than three times a week    Attends Religious Services: Never    Database administrator or Organizations: No    Attends Banker Meetings: Never    Marital Status: Married  Catering manager Violence: Not At Risk (09/21/2023)   Humiliation, Afraid, Rape, and Kick questionnaire    Fear of Current or Ex-Partner: No    Emotionally Abused: No    Physically Abused: No    Sexually Abused: No    Past Surgical History:  Procedure Laterality Date   ARTHROPLASTY  2004   right knee Dr. Melodi   BACK SURGERY     BIOPSY  11/21/2021   Procedure: BIOPSY;  Surgeon: Legrand Victory LITTIE DOUGLAS, MD;  Location: [facility];  Service: Gastroenterology;;   CARDIAC CATHETERIZATION     CARDIAC VALVE REPLACEMENT     CARPAL TUNNEL RELEASE     right hand,wrist Dr. Leonor   CYSTOSCOPY N/A 09/20/2012   Procedure: CYSTOSCOPY;  Surgeon: Noretta Ferrara, MD;  Location: [facility];  Service: Urology;  Laterality: N/A;   ESOPHAGOGASTRODUODENOSCOPY N/A 11/21/2021   Procedure: ESOPHAGOGASTRODUODENOSCOPY (EGD);  Surgeon: Legrand Victory LITTIE DOUGLAS, MD;  Location: [facility];  Service: Gastroenterology;  Laterality: N/A;   ESOPHAGOGASTRODUODENOSCOPY (EGD)  WITH PROPOFOL  N/A 11/30/2021   Procedure: ESOPHAGOGASTRODUODENOSCOPY (EGD) WITH PROPOFOL ;  Surgeon: Albertus Gordy HERO, MD;  Location: [facility];  Service: Gastroenterology;  Laterality: N/A;   HEMOSTASIS CLIP PLACEMENT  11/30/2021   Procedure: HEMOSTASIS CLIP PLACEMENT;  Surgeon: Albertus Gordy HERO, MD;  Location: [facility];  Service: Gastroenterology;;   INGUINAL HERNIA REPAIR Right 01/14/2022   Procedure: RIGHT OPEN INGUINAL HERNIA REPAIR WITH MESH;  Surgeon: Kinsinger, Herlene Righter, MD;  Location: [facility];  Service: General;  Laterality: Right;   JOINT REPLACEMENT Right    KNEE ARTHROSCOPY Right    LUMBAR LAMINECTOMY  02/08/2013   PROSTATECTOMY     radical   RIGHT/LEFT HEART CATH AND CORONARY ANGIOGRAPHY N/A 05/08/2020  Procedure: RIGHT/LEFT HEART CATH AND CORONARY ANGIOGRAPHY;  Surgeon: Wonda Sharper, MD;  Location: [facility]ASIVE CV LAB;  Service: Cardiovascular;  Laterality: N/A;   SPINE SURGERY     TEE WITHOUT CARDIOVERSION N/A 08/12/2020   Procedure: TRANSESOPHAGEAL ECHOCARDIOGRAM (TEE);  Surgeon: Wonda Sharper, MD;  Location: [facility] INVASIVE CV LAB;  Service: Open Heart Surgery;  Laterality: N/A;   TOOTH EXTRACTION  12/2021   TRANSCATHETER AORTIC VALVE REPLACEMENT, TRANSFEMORAL N/A 08/12/2020   Procedure: TRANSCATHETER AORTIC VALVE REPLACEMENT, TRANSFEMORAL;  Surgeon: Wonda Sharper, MD;  [facility] CV LAB;  Service: Open Heart Surgery;  Laterality: N/A;   VASECTOMY  1985    Family History  Problem Relation Age of Onset   Heart disease Mother    Heart failure Mother    Heart disease Father    Diabetes Father    Colon cancer Neg Hx    Esophageal cancer Neg Hx    Inflammatory bowel disease Neg Hx    Liver disease Neg Hx    Pancreatic cancer Neg Hx    Rectal cancer Neg Hx    Stomach cancer Neg Hx     Allergies  Allergen Reactions   Aspirin  Other (See Comments)    History of bleeding ulcers!!! Only Tylenol  is tolerated.   Diclofenac  Other (See Comments)    History  of bleeding ulcers!!! Only Tylenol  is tolerated.   Excedrin Tension Headache [Acetaminophen -Caffeine] Other (See Comments)    History of bleeding ulcers!!! Only Tylenol  is tolerated.   Motrin [Ibuprofen] Other (See Comments)    History of bleeding ulcers!!! Only Tylenol  is tolerated.   Nsaids Other (See Comments)    History of bleeding ulcers!!! Only Tylenol  is tolerated.    Current Outpatient Medications on File Prior to Visit  Medication Sig Dispense Refill   amLODipine  (NORVASC ) 2.5 MG tablet TAKE 1 TABLET BY MOUTH EVERY DAY (Patient taking differently: Take 2.5 mg by mouth in the morning.) 90 tablet 2   atorvastatin  (LIPITOR) 40 MG tablet Take 1 tablet (40 mg total) by mouth daily. (Patient taking differently: Take 40 mg by mouth at bedtime.) 90 tablet 3   BD PEN NEEDLE NANO 2ND GEN 32G X 4 MM MISC daily.     calcium  carbonate (TUMS - DOSED IN MG ELEMENTAL CALCIUM ) 500 MG chewable tablet Chew 2 tablets by mouth daily as needed for indigestion or heartburn.     Cholecalciferol (VITAMIN D3) 1000 units CAPS Take 2,000 Units by mouth daily. 1 capsule 0   citalopram  (CELEXA ) 10 MG tablet TAKE 1 TABLET BY MOUTH EVERY DAY 90 tablet 2   Coenzyme Q10 (COQ10) 100 MG CAPS Take 100 mg by mouth daily.     Continuous Glucose Receiver (DEXCOM G7 RECEIVER) DEVI use to monitor blood sugar for 365 days     Continuous Glucose Sensor (DEXCOM G7 SENSOR) MISC Inject 1 Device into the skin See admin instructions. Place 1 new sensor into the skin every 10-11 days 3 each 3   CRANBERRY PO Take 1 tablet by mouth in the morning.     donepezil  (ARICEPT ) 10 MG tablet Take 1 tablet (10 mg total) by mouth at bedtime. 90 tablet 3   ferrous gluconate  (FERGON) 324 MG tablet TAKE 1 TABLET BY MOUTH EVERY DAY WITH BREAKFAST 30 tablet 0   glucose 4 GM chewable tablet Chew 1 tablet by mouth as needed for low blood sugar.     Insulin  Glargine (BASAGLAR  KWIKPEN) 100 UNIT/ML Inject 36 Units into the skin daily after lunch.  losartan -hydrochlorothiazide  (HYZAAR) 100-25 MG tablet Take 1 tablet by mouth in the morning.     Melatonin 5 MG CHEW Chew 15 mg by mouth at bedtime.     metoprolol  succinate (TOPROL -XL) 50 MG 24 hr tablet TAKE 1 TABLET DAILY, WITH  OR IMMEDIATELY FOLLOWING A MEAL (Patient taking differently: Take 50 mg by mouth daily after breakfast.) 90 tablet 3   NON FORMULARY Take 4 tablets by mouth See admin instructions. Focus Factor tablets - Take 4 tablets by mouth in the morning with breakfast     OZEMPIC, 0.25 OR 0.5 MG/DOSE, 2 MG/3ML SOPN Inject 0.5 mg into the skin every Sunday.     pantoprazole  (PROTONIX ) 40 MG tablet TAKE 1 TABLET BY MOUTH EVERY DAY 90 tablet 0   TYLENOL  500 MG tablet Take 1,000 mg by mouth every 6 (six) hours as needed (for headaches).     No current facility-administered medications on file prior to visit.    BP 95/60   Pulse 69   Temp 97.7 F (36.5 C) (Oral)   Ht 5' 5 (1.651 m)   Wt 159 lb (72.1 kg)   SpO2 95%   BMI 26.46 kg/m       Objective:   Physical Exam Vitals and nursing note reviewed.  Constitutional:      Appearance: Normal appearance.  Cardiovascular:     Rate and Rhythm: Normal rate and regular rhythm.     Pulses: Normal pulses.     Heart sounds: Normal heart sounds.  Pulmonary:     Effort: Pulmonary effort is normal.  Abdominal:     General: Abdomen is flat. Bowel sounds are normal.     Palpations: Abdomen is soft.  Musculoskeletal:        General: Normal range of motion.  Skin:    General: Skin is warm and dry.  Neurological:     General: No focal deficit present.     Mental Status: He is alert and oriented to person, place, and time.  Psychiatric:        Mood and Affect: Mood normal.        Behavior: Behavior normal.        Thought Content: Thought content normal.        Judgment: Judgment normal.        Assessment & Plan:  1. Dysuria (Primary)  - POC Urinalysis Dipstick + blood, lueks and nitrates. Will treat with Vantin  since  he did well with this antibitoic - Follow up with Urology   - Culture, Urine  2. Acute cystitis with hematuria  - cefpodoxime  (VANTIN ) 200 MG tablet; Take 1 tablet (200 mg total) by mouth 2 (two) times daily for 10 days.  Dispense: 20 tablet; Refill: 0  Darleene Shape, NP   I personally spent a total of 31 minutes in the care of the patient today including preparing to see the patient, getting/reviewing separately obtained history, performing a medically appropriate exam/evaluation, counseling and educating, placing orders, and documenting clinical information in the EHR.

## 2023-11-04 LAB — URINE CULTURE
MICRO NUMBER:: 17017161
Result:: NO GROWTH
SPECIMEN QUALITY:: ADEQUATE

## 2023-11-08 ENCOUNTER — Ambulatory Visit: Payer: Self-pay | Admitting: Adult Health

## 2023-11-08 ENCOUNTER — Ambulatory Visit: Payer: Self-pay | Admitting: *Deleted

## 2023-11-08 NOTE — Telephone Encounter (Signed)
  FYI Only or Action Required?: Action required by provider: update on patient condition and reviewed message from KYM Shape, NP from 11/08/23.  Patient was last seen in primary care on 11/03/2023 by Shape Huxley, NP.  Called Nurse Triage reporting Dysuria.  Symptoms began prior to last OV 11/03/23.  Interventions attempted: Prescription medications: antibiotics.  Symptoms are: gradually worsening.  Triage Disposition: See Physician Within 24 Hours  Patient/caregiver understands and will follow disposition?: Unsure   Patient wife aware to contact patient's urologist for f/u.              Copied from CRM [id number]. Topic: Clinical - Red Word Triage >> Nov 08, 2023 10:17 AM Tinnie BROCKS wrote: Red Word that prompted transfer to Nurse Triage: Daughter Olam is calling on pts behalf to let us  know that pt is still experiencing pain with urination. Was prev. Diagnosed with UTI, but culture was negative. Dr suggested he sees urology according to telephone encounter. Reason for Disposition  All other males with painful urination  Answer Assessment - Initial Assessment Questions Reviewed message from KYM Shape, NP from 11/08/23 on 11/08/23 with wife.  His urine culture did not show a UTI. He can stop antibiotics. Follow up with Urology if symptoms continue. Patient wife advise to contact urology. Wife reports patient has had kidney stones in the past. Recommended if pain worsens or other sx noted call back or go to urology or UC if needed.       1. SEVERITY: How bad is the pain?  (e.g., Scale 1-10; mild, moderate, or severe)     Did not rate per patient's wife. Reports burning continues with urination 2. FREQUENCY: How many times have you had painful urination today?      Every time 3. PATTERN: Is pain present every time you urinate or just sometimes?      Yes  4. ONSET: When did the painful urination start?      On going since last OV 11/03/23 5. FEVER: Do you have a  fever? If Yes, ask: What is your temperature, how was it measured, and when did it start?     No  6. PAST UTI: Have you had a urine infection before? If Yes, ask: When was the last time? and What happened that time?      Na  7. CAUSE: What do you think is causing the painful urination?      Not sure UTI but culture does not show UTI 8. OTHER SYMPTOMS: Do you have any other symptoms? (e.g., flank pain, penis discharge, scrotal pain, blood in urine)     Burning with urination, no back pain no side pain. No blood in urine.  Protocols used: Urination Pain - Male-A-AH

## 2023-11-14 DIAGNOSIS — N3020 Other chronic cystitis without hematuria: Secondary | ICD-10-CM | POA: Diagnosis not present

## 2023-11-14 DIAGNOSIS — N35816 Other urethral stricture, male, overlapping sites: Secondary | ICD-10-CM | POA: Diagnosis not present

## 2023-11-16 ENCOUNTER — Other Ambulatory Visit: Payer: Self-pay | Admitting: Gastroenterology

## 2023-12-07 DIAGNOSIS — N3020 Other chronic cystitis without hematuria: Secondary | ICD-10-CM | POA: Diagnosis not present

## 2023-12-07 DIAGNOSIS — N35816 Other urethral stricture, male, overlapping sites: Secondary | ICD-10-CM | POA: Diagnosis not present

## 2023-12-09 DIAGNOSIS — N35816 Other urethral stricture, male, overlapping sites: Secondary | ICD-10-CM | POA: Diagnosis not present

## 2023-12-09 DIAGNOSIS — N35812 Other urethral bulbous stricture, male: Secondary | ICD-10-CM | POA: Diagnosis not present

## 2023-12-09 DIAGNOSIS — N3020 Other chronic cystitis without hematuria: Secondary | ICD-10-CM | POA: Diagnosis not present

## 2023-12-12 ENCOUNTER — Other Ambulatory Visit: Payer: Self-pay | Admitting: Gastroenterology

## 2023-12-13 ENCOUNTER — Ambulatory Visit: Attending: Internal Medicine | Admitting: Internal Medicine

## 2023-12-13 ENCOUNTER — Encounter: Payer: Self-pay | Admitting: Internal Medicine

## 2023-12-13 VITALS — BP 108/62 | HR 63 | Ht 65.0 in | Wt 165.0 lb

## 2023-12-13 DIAGNOSIS — I2510 Atherosclerotic heart disease of native coronary artery without angina pectoris: Secondary | ICD-10-CM | POA: Diagnosis not present

## 2023-12-13 DIAGNOSIS — Z952 Presence of prosthetic heart valve: Secondary | ICD-10-CM | POA: Diagnosis not present

## 2023-12-13 DIAGNOSIS — I10 Essential (primary) hypertension: Secondary | ICD-10-CM | POA: Insufficient documentation

## 2023-12-13 DIAGNOSIS — E119 Type 2 diabetes mellitus without complications: Secondary | ICD-10-CM | POA: Diagnosis not present

## 2023-12-13 DIAGNOSIS — E785 Hyperlipidemia, unspecified: Secondary | ICD-10-CM | POA: Insufficient documentation

## 2023-12-13 NOTE — Patient Instructions (Signed)
 Medication Instructions:  NO CHANGES  *If you need a refill on your cardiac medications before your next appointment, please call your pharmacy*   Follow-Up: At [facility], you and your health needs are our priority.  As part of our continuing mission to provide you with exceptional heart care, our providers are all part of one team.  This team includes your primary Cardiologist (physician) and Advanced Practice Providers or APPs (Physician Assistants and Nurse Practitioners) who all work together to provide you with the care you need, when you need it.  Your next appointment:    12 months with Dr. Maximo Spar  We recommend signing up for the patient portal called "MyChart".  Sign up information is provided on this After Visit Summary.  MyChart is used to connect with patients for Virtual Visits (Telemedicine).  Patients are able to view lab/test results, encounter notes, upcoming appointments, etc.  Non-urgent messages can be sent to your provider as well.   To learn more about what you can do with MyChart, go to [email or web address].

## 2023-12-13 NOTE — Progress Notes (Signed)
 OFFICE NOTE  Chief Complaint:  Follow-up  Primary Care Physician: Jordan, [name], MD  HPI:  Daryl Webb is a 78 y.o. male followed by Dr. Dominick in the past for hypertension, dyslipidemia, type 2 diabetes and mild aortic sclerosis. His last echocardiogram was in 2015 which showed normal LV function and mild aortic sclerosis/borderline stenosis. Recent lab work was performed by myself indicating hemoglobin A1c of 6.7, creatinine 1.4 however he had a recent UTI, and cholesterol profile which was favorable with LDL 70 and total cholesterol 123. He is on atorvastatin  40 mg daily. He has no history of known coronary artery disease.  02/12/2016  Daryl Webb returns today for follow-up. He recently established a new primary care provider with Dr. Betty Jordan. She performs some lab work including cholesterol which appears to be well-controlled with total cholesterol 113, LDL 58, triglycerides 130 and HDL of 29, which is increased from 24 in May 2017. He denies any new symptoms of chest pain or worsening shortness of breath. He does have a early peaking systolic murmur suggestive of mild aortic stenosis. This was last seen on echo in 2015. He is also describing pain in the left flank which radiates into the left groin. He was treated for possible UTI/prostatitis and had improvement in some urinary symptoms but has persistent pain. A CT scan was performed recently, which was essentially unremarkable. He reports his pain is worse when rising from either sitting or lying position up to standing and with walking. This could suggest some compression perhaps of the thoracic or lumbar spine or even symptoms suggestive of a possible hernia.   08/18/2016  Daryl Webb is seen today in follow-up. Overall he seems to be doing pretty well. He had an echocardiogram in January 2018 which now shows moderate aortic stenosis. This is worsened since his prior study in 2015. He has type 2 diabetes and A1c  is 6.7. Blood pressure is well-controlled today 122/60. Recent lipid profile showed an LDL-C of 58, which is at goal. He's asymptomatic, denies any chest pain or worsening shortness of breath. He does do some walking although says he can exercise more frequently.  02/23/2017  Daryl Webb was seen today in follow-up.  He had a recent echocardiogram which showed mild worsening of his aortic stenosis.  His mean gradient went from 22-24 mmHg.  This is still moderate left ear.  He has generally good blood pressure control.  Initially blood pressure is elevated today 142/86 however came down to 124/78.  His diabetes has been fairly well controlled.  He is followed by Dr. Faythe for this.  Recent lipid profile showed an LDL-C of 58 on high intensity atorvastatin .  10/10/2018  Daryl Webb is seen today in follow-up.  Overall he is doing well.  He had a repeat echo in January of this year which showed slight worsening of his aortic stenosis.  His mean aortic gradient is increased from 24 to 30 mmHg putting it in the moderate to severe stenosis area.  He denies any chest pain or worsening shortness of breath.  He has had no syncopal events.  Diabetes is well controlled and he is followed by Dr. Faythe.  His blood pressure is at goal today.  His lipids have been well managed.  His EKG shows sinus rhythm with some PACs today at 87.  04/28/2019  Daryl Webb returns for follow-up.  He recently had a repeat echo which I personally reviewed.  This shows a stable aortic valve gradient  around more consistent with moderate aortic stenosis.  A new finding, however was some inferior hypokinesis with a low normal EF 50 to 55%.  He denies any anginal symptoms.  This is reduced from previous EF of 65%.  EKG today shows sinus rhythm with some PACs however there are inferior and lateral T wave changes possibly suggestive of ischemia.  He says he has been less active over the past year but really denies any anginal  symptoms.  05/02/2020  Daryl Webb returns today for follow-up of his echo to assess aortic stenosis.  Been just over a year since his last study.  This echo however shows some significant progression of his aortic stenosis.  LVEF has dropped slightly to 50 to 55%.  It is now suggested that he has severe aortic stenosis with a mean gradient of 34 mmHg and valve area of 0.99.  There is grade 2 diastolic dysfunction.  His dimensionless index is 0.26.  I discussed this with him and his wife who was joining us  via telephone conference during the office visit today.  She notes that he has been more short of breath but has not had recent chest pain.  He seems to downplay it somewhat.  He says he has been less active.  04/01/2021  Daryl Webb returns today for follow-up.  He underwent TAVR in July 2022.  Since then he is done extremely well.  He had his last echo in August 2022 which showed low valve gradient with no perivalvular leak and normal LV function.  Since then he has done well without a worsening shortness of breath or chest pain.  Blood pressure is well controlled today.  Weight is near normal.  EKG is unchanged.  12/08/2022  Daryl Webb is seen today in follow-up.  Overall he says he is feeling well.  He denies any chest pain or shortness of breath.  He did have a follow-up echo in February to reassess gradients across his aortic valve replacement.  His LVEF was normal and gradients across the valve were normal without any perivalvular leak.  Blood pressure appears to be well-controlled.  He had lipids in April of this year which showed total cholesterol of 85 with an LDL of 33.  Hemoglobin A1c was 6.4%.  12/13/2023  Daryl Webb is seen today in follow-up.  He seems to be doing pretty well although has had some recent memory issues and was diagnosed with Alzheimer's disease.  From a cardiac standpoint his aortic valve replacement has done well.  He had an echo last year which showed  stable normal gradients.  Blood pressure is also well-controlled today.  He had lipid testing in July showing total cholesterol 97, HDL 33, triglycerides 111 and LDL 43.  He does have an elevated serum creatinine of 1.7 which is chronic.  PMHx:  Past Medical History:  Diagnosis Date   Acute blood loss anemia 06/27/2012   Anemia    Arthritis    Blood transfusion without reported diagnosis    CKD (chronic kidney disease)    Clotting disorder 11/20/21   Bleeding ulcer   Constipation due to pain medication    needs stool softner while on pain medication   Diabetes mellitus    Duodenal ulcer 08/13/2015   2014  - Dr Kaplan/GI   Duodenal ulcer with hemorrhage 08/13/2015   2014  - Dr Kaplan/GI   Dyspnea 06/19/2012   GERD (gastroesophageal reflux disease)    H/O: osteoarthritis    Heart murmur  History of kidney stones    History of prostate cancer    History of urethral stricture    Hypercholesterolemia    Hypertension    Melena 06/19/2012   PONV (postoperative nausea and vomiting)    Prostate cancer (HCC)    prostate   S/P TAVR (transcatheter aortic valve replacement) 08/12/2020   s/p TAVR with a 29 mm Edwards Sapien 3 THV via the TF approach by Dr. Wonda and Dr. Lucas   Severe aortic stenosis    Spinal stenosis    Ulcer duodenal hemorrhage     Past Surgical History:  Procedure Laterality Date   ARTHROPLASTY  2004   right knee Dr. Melodi SANES SURGERY     BIOPSY  11/21/2021   Procedure: BIOPSY;  Surgeon: Legrand Victory LITTIE DOUGLAS, MD;  Location: [facility];  Service: Gastroenterology;;   CARDIAC CATHETERIZATION     CARDIAC VALVE REPLACEMENT     CARPAL TUNNEL RELEASE     right hand,wrist Dr. Leonor   CYSTOSCOPY N/A 09/20/2012   Procedure: PHYLLIS;  Surgeon: Noretta Ferrara, MD;  Location: [facility];  Service: Urology;  Laterality: N/A;   ESOPHAGOGASTRODUODENOSCOPY N/A 11/21/2021   Procedure: ESOPHAGOGASTRODUODENOSCOPY (EGD);  Surgeon: Legrand Victory LITTIE DOUGLAS, MD;  Location:  [facility];  Service: Gastroenterology;  Laterality: N/A;   ESOPHAGOGASTRODUODENOSCOPY (EGD) WITH PROPOFOL  N/A 11/30/2021   Procedure: ESOPHAGOGASTRODUODENOSCOPY (EGD) WITH PROPOFOL ;  Surgeon: Albertus Gordy HERO, MD;  Location: [facility];  Service: Gastroenterology;  Laterality: N/A;   HEMOSTASIS CLIP PLACEMENT  11/30/2021   Procedure: HEMOSTASIS CLIP PLACEMENT;  Surgeon: Albertus Gordy HERO, MD;  Location: [facility];  Service: Gastroenterology;;   INGUINAL HERNIA REPAIR Right 01/14/2022   Procedure: RIGHT OPEN INGUINAL HERNIA REPAIR WITH MESH;  Surgeon: Kinsinger, Herlene Righter, MD;  Location: [facility];  Service: General;  Laterality: Right;   JOINT REPLACEMENT Right    KNEE ARTHROSCOPY Right    LUMBAR LAMINECTOMY  02/08/2013   PROSTATECTOMY     radical   RIGHT/LEFT HEART CATH AND CORONARY ANGIOGRAPHY N/A 05/08/2020   Procedure: RIGHT/LEFT HEART CATH AND CORONARY ANGIOGRAPHY;  Surgeon: Wonda Sharper, MD;  Location: [facility] INVASIVE CV LAB;  Service: Cardiovascular;  Laterality: N/A;   SPINE SURGERY     TEE WITHOUT CARDIOVERSION N/A 08/12/2020   Procedure: TRANSESOPHAGEAL ECHOCARDIOGRAM (TEE);  Surgeon: Wonda Sharper, MD;  Location: [facility] INVASIVE CV LAB;  Service: Open Heart Surgery;  Laterality: N/A;   TOOTH EXTRACTION  12/2021   TRANSCATHETER AORTIC VALVE REPLACEMENT, TRANSFEMORAL N/A 08/12/2020   Procedure: TRANSCATHETER AORTIC VALVE REPLACEMENT, TRANSFEMORAL;  Surgeon: Wonda Sharper, MD;  Location: [facility] INVASIVE CV LAB;  Service: Open Heart Surgery;  Laterality: N/A;   VASECTOMY  1985    FAMHx:  Family History  Problem Relation Age of Onset   Heart disease Mother    Heart failure Mother    Heart disease Father    Diabetes Father    Colon cancer Neg Hx    Esophageal cancer Neg Hx    Inflammatory bowel disease Neg Hx    Liver disease Neg Hx    Pancreatic cancer Neg Hx    Rectal cancer Neg Hx    Stomach cancer Neg Hx     SOCHx:   reports that he quit smoking about 43 years ago. His  smoking use included cigarettes. He started smoking about 63 years ago. He has a 30 pack-year smoking history. He has never used smokeless tobacco. He reports that he does not drink alcohol and does not  use drugs.  ALLERGIES:  Allergies  Allergen Reactions   Aspirin  Other (See Comments)    History of bleeding ulcers!!! Only Tylenol  is tolerated.   Diclofenac  Other (See Comments)    History of bleeding ulcers!!! Only Tylenol  is tolerated.   Excedrin Tension Headache [Acetaminophen -Caffeine] Other (See Comments)    History of bleeding ulcers!!! Only Tylenol  is tolerated.   Motrin [Ibuprofen] Other (See Comments)    History of bleeding ulcers!!! Only Tylenol  is tolerated.   Nsaids Other (See Comments)    History of bleeding ulcers!!! Only Tylenol  is tolerated.    ROS: Pertinent items noted in HPI and remainder of comprehensive ROS otherwise negative.  HOME MEDS: Current Outpatient Medications  Medication Sig Dispense Refill   amLODipine  (NORVASC ) 2.5 MG tablet TAKE 1 TABLET BY MOUTH EVERY DAY (Patient taking differently: Take 2.5 mg by mouth in the morning.) 90 tablet 2   atorvastatin  (LIPITOR) 40 MG tablet Take 1 tablet (40 mg total) by mouth daily. (Patient taking differently: Take 40 mg by mouth at bedtime.) 90 tablet 3   BD PEN NEEDLE NANO 2ND GEN 32G X 4 MM MISC daily.     calcium  carbonate (TUMS - DOSED IN MG ELEMENTAL CALCIUM ) 500 MG chewable tablet Chew 2 tablets by mouth daily as needed for indigestion or heartburn.     Cholecalciferol (VITAMIN D3) 1000 units CAPS Take 2,000 Units by mouth daily. 1 capsule 0   citalopram  (CELEXA ) 10 MG tablet TAKE 1 TABLET BY MOUTH EVERY DAY 90 tablet 2   Coenzyme Q10 (COQ10) 100 MG CAPS Take 100 mg by mouth daily.     CRANBERRY PO Take 1 tablet by mouth in the morning.     donepezil  (ARICEPT ) 10 MG tablet Take 1 tablet (10 mg total) by mouth at bedtime. 90 tablet 3   FARXIGA  10 MG TABS tablet Take 10 mg by mouth daily.     ferrous gluconate   (FERGON) 324 MG tablet TAKE 1 TABLET BY MOUTH EVERY DAY WITH BREAKFAST 30 tablet 0   glucose 4 GM chewable tablet Chew 1 tablet by mouth as needed for low blood sugar.     Insulin  Glargine (BASAGLAR  KWIKPEN) 100 UNIT/ML Inject 36 Units into the skin daily after lunch.     losartan -hydrochlorothiazide  (HYZAAR) 100-25 MG tablet Take 1 tablet by mouth in the morning.     Melatonin 5 MG CHEW Chew 15 mg by mouth at bedtime.     metoprolol  succinate (TOPROL -XL) 50 MG 24 hr tablet TAKE 1 TABLET DAILY, WITH  OR IMMEDIATELY FOLLOWING A MEAL (Patient taking differently: Take 50 mg by mouth daily after breakfast.) 90 tablet 3   NON FORMULARY Take 4 tablets by mouth See admin instructions. Focus Factor tablets - Take 4 tablets by mouth in the morning with breakfast     OZEMPIC, 0.25 OR 0.5 MG/DOSE, 2 MG/3ML SOPN Inject 0.5 mg into the skin every Sunday.     pantoprazole  (PROTONIX ) 40 MG tablet TAKE 1 TABLET BY MOUTH EVERY DAY 90 tablet 0   TYLENOL  500 MG tablet Take 1,000 mg by mouth every 6 (six) hours as needed (for headaches).     Continuous Glucose Receiver (DEXCOM G7 RECEIVER) DEVI use to monitor blood sugar for 365 days (Patient not taking: Reported on 12/13/2023)     Continuous Glucose Sensor (DEXCOM G7 SENSOR) MISC Inject 1 Device into the skin See admin instructions. Place 1 new sensor into the skin every 10-11 days (Patient not taking: Reported on 12/13/2023) 3 each  3   No current facility-administered medications for this visit.    LABS/IMAGING: No results found for this or any previous visit (from the past 48 hours). No results found.  WEIGHTS: Wt Readings from Last 3 Encounters:  12/13/23 165 lb (74.8 kg)  11/03/23 159 lb (72.1 kg)  09/21/23 157 lb (71.2 kg)    VITALS: BP 108/62 (BP Location: Left Arm, Patient Position: Sitting, Cuff Size: Normal)   Pulse 63   Ht 5' 5 (1.651 m)   Wt 165 lb (74.8 kg)   SpO2 95%   BMI 27.46 kg/m   EXAM: General appearance: alert and no  distress Neck: no carotid bruit, no JVD and thyroid  not enlarged, symmetric, no tenderness/mass/nodules Lungs: clear to auscultation bilaterally Heart: regular rate and rhythm, S1, S2 normal and systolic murmur: Soft 2 out of 6 systolic murmur at the right upper sternal border Abdomen: soft, non-tender; bowel sounds normal; no masses,  no organomegaly Extremities: extremities normal, atraumatic, no cyanosis or edema Pulses: 2+ and symmetric Skin: Skin color, texture, turgor normal. No rashes or lesions Neurologic: Grossly normal Psych: Pleasant  EKG: EKG Interpretation Date/Time:  Tuesday December 13 2023 14:44:47 EST Ventricular Rate:  63 PR Interval:  156 QRS Duration:  96 QT Interval:  434 QTC Calculation: 444 R Axis:   -31  Text Interpretation: Normal sinus rhythm Left axis deviation Low voltage QRS Nonspecific ST and T wave abnormality When compared with ECG of 07-Dec-2022 15:52, No significant change was found Confirmed by Webb Daryl [phone]) on 12/13/2023 2:53:45 PM    ASSESSMENT: LVEF 55 to 60% with normal TAVR gradient (03/2022) Hypertension Dyslipidemia-at goal LDL<70 Diabetes type 2-A1c 6.1% followed by Dr. Faythe Severe symptomatic aortic stenosis - mean gradient 34 mmHg, AVA 0.99 cm2 (03/2020) -status post TAVR with 29 mm Edwards SAPIEN 3 valve (08/12/2020) Alzheimer's dementia  PLAN: 1.   Daryl Webb seems to be doing well from a cardiac standpoint although unfortunately has developed an Alzheimer's type dementia.  Blood pressure is well-controlled.  His lipids are at goal.  His A1c is well-controlled at 6.1%.  His TAVR gradients have been stable on echo and I think we can wait another year or 2 to repeat that.  No changes to his medicines today.  Follow-up with me annually or sooner as necessary  Daryl KYM Mona, MD, [facility], FNLA, FACP  [place]  [facility]  Medical Director of the [facility] Lipid Disorders &  Cardiovascular Risk Reduction Clinic Diplomate  of the American Board of Clinical Lipidology Attending Cardiologist  Direct Dial: [phone]  Fax: [phone]  Website:  [email or web address] Daryl Webb 12/13/2023, 2:58 PM

## 2023-12-16 ENCOUNTER — Other Ambulatory Visit: Payer: Self-pay | Admitting: Urology

## 2023-12-21 DIAGNOSIS — R41841 Cognitive communication deficit: Secondary | ICD-10-CM | POA: Diagnosis not present

## 2023-12-21 DIAGNOSIS — R2681 Unsteadiness on feet: Secondary | ICD-10-CM | POA: Diagnosis not present

## 2023-12-21 DIAGNOSIS — G309 Alzheimer's disease, unspecified: Secondary | ICD-10-CM | POA: Diagnosis not present

## 2023-12-21 DIAGNOSIS — M6281 Muscle weakness (generalized): Secondary | ICD-10-CM | POA: Diagnosis not present

## 2023-12-21 DIAGNOSIS — R296 Repeated falls: Secondary | ICD-10-CM | POA: Diagnosis not present

## 2023-12-21 DIAGNOSIS — R278 Other lack of coordination: Secondary | ICD-10-CM | POA: Diagnosis not present

## 2023-12-26 DIAGNOSIS — R278 Other lack of coordination: Secondary | ICD-10-CM | POA: Diagnosis not present

## 2023-12-26 DIAGNOSIS — R41841 Cognitive communication deficit: Secondary | ICD-10-CM | POA: Diagnosis not present

## 2023-12-26 DIAGNOSIS — R2681 Unsteadiness on feet: Secondary | ICD-10-CM | POA: Diagnosis not present

## 2023-12-26 DIAGNOSIS — M6281 Muscle weakness (generalized): Secondary | ICD-10-CM | POA: Diagnosis not present

## 2023-12-26 DIAGNOSIS — R296 Repeated falls: Secondary | ICD-10-CM | POA: Diagnosis not present

## 2023-12-26 DIAGNOSIS — G309 Alzheimer's disease, unspecified: Secondary | ICD-10-CM | POA: Diagnosis not present

## 2023-12-27 DIAGNOSIS — F02B0 Dementia in other diseases classified elsewhere, moderate, without behavioral disturbance, psychotic disturbance, mood disturbance, and anxiety: Secondary | ICD-10-CM | POA: Diagnosis not present

## 2023-12-27 DIAGNOSIS — M6281 Muscle weakness (generalized): Secondary | ICD-10-CM | POA: Diagnosis not present

## 2023-12-27 DIAGNOSIS — R278 Other lack of coordination: Secondary | ICD-10-CM | POA: Diagnosis not present

## 2023-12-27 DIAGNOSIS — R41841 Cognitive communication deficit: Secondary | ICD-10-CM | POA: Diagnosis not present

## 2023-12-27 DIAGNOSIS — R296 Repeated falls: Secondary | ICD-10-CM | POA: Diagnosis not present

## 2023-12-27 DIAGNOSIS — R2681 Unsteadiness on feet: Secondary | ICD-10-CM | POA: Diagnosis not present

## 2023-12-27 DIAGNOSIS — G309 Alzheimer's disease, unspecified: Secondary | ICD-10-CM | POA: Diagnosis not present

## 2023-12-27 NOTE — Patient Instructions (Signed)
 SURGICAL WAITING ROOM VISITATION  Patients having surgery or a procedure may have no more than 2 support people in the waiting area - these visitors may rotate.    Children under the age of 64 must have an adult with them who is not the patient.  Visitors with respiratory illnesses are discouraged from visiting and should remain at home.  If the patient needs to stay at the hospital during part of their recovery, the visitor guidelines for inpatient rooms apply. Pre-op nurse will coordinate an appropriate time for 1 support person to accompany patient in pre-op.  This support person may not rotate.    Please refer to the [facility] website for the visitor guidelines for Inpatients (after your surgery is over and you are in a regular room).       Your procedure is scheduled on:  01/03/24    Report to [facility] Main Entrance    Report to admitting at   1045AM   Call this number if you have problems the morning of surgery [phone]   Do not eat food :After Midnight.   After Midnight you may have the following liquids until _0945_____ AM  DAY OF SURGERY  Water Non-Citrus Juices (without pulp, NO RED-Apple, White grape, White cranberry) Black Coffee (NO MILK/CREAM OR CREAMERS, sugar ok)  Clear Tea (NO MILK/CREAM OR CREAMERS, sugar ok) regular and decaf                             Plain Jell-O (NO RED)                                           Fruit ices (not with fruit pulp, NO RED)                                     Popsicles (NO RED)                                                               Sports drinks like Gatorade (NO RED)                           If you have questions, please contact your surgeon's office.      Oral Hygiene is also important to reduce your risk of infection.                                    Remember - BRUSH YOUR TEETH THE MORNING OF SURGERY WITH YOUR REGULAR TOOTHPASTE  DENTURES WILL BE REMOVED PRIOR TO SURGERY PLEASE DO NOT APPLY  Poly grip OR ADHESIVES!!!   Do NOT smoke after Midnight   Stop all vitamins and herbal supplements 7 days before surgery.   Take these medicines the morning of surgery with A SIP OF WATER:  amlodine, celexa , toprol , protonix                  Farxiga - hld for  72 hours prior to surgery last dose on 12/30/23                 Ozempic- last dose on   DO NOT TAKE ANY ORAL DIABETIC MEDICATIONS DAY OF YOUR SURGERY  Bring CPAP mask and tubing day of surgery.                              You may not have any metal on your body including hair pins, jewelry, and body piercing             Do not wear make-up, lotions, powders, perfumes/cologne, or deodorant  Do not wear nail polish including gel and S&S, artificial/acrylic nails, or any other type of covering on natural nails including finger and toenails. If you have artificial nails, gel coating, etc. that needs to be removed by a nail salon please have this removed prior to surgery or surgery may need to be canceled/ delayed if the surgeon/ anesthesia feels like they are unable to be safely monitored.   Do not shave  48 hours prior to surgery.               Men may shave face and neck.   Do not bring valuables to the hospital. [facility] IS NOT             RESPONSIBLE   FOR VALUABLES.   Contacts, glasses, dentures or bridgework may not be worn into surgery.   Bring small overnight bag day of surgery.   DO NOT BRING YOUR HOME MEDICATIONS TO THE HOSPITAL. PHARMACY WILL DISPENSE MEDICATIONS LISTED ON YOUR MEDICATION LIST TO YOU DURING YOUR ADMISSION IN THE HOSPITAL!    Patients discharged on the day of surgery will not be allowed to drive home.  Someone NEEDS to stay with you for the first 24 hours after anesthesia.   Special Instructions: Bring a copy of your healthcare power of attorney and living will documents the day of surgery if you haven't scanned them before.              Please read over the following fact sheets you were given:  IF YOU HAVE QUESTIONS ABOUT YOUR PRE-OP INSTRUCTIONS PLEASE CALL [phone].   If you received a COVID test during your pre-op visit  it is requested that you wear a mask when out in public, stay away from anyone that may not be feeling well and notify your surgeon if you develop symptoms. If you test positive for Covid or have been in contact with anyone that has tested positive in the last 10 days please notify you surgeon.    [place] - Preparing for Surgery Before surgery, you can play an important role.  Because skin is not sterile, your skin needs to be as free of germs as possible.  You can reduce the number of germs on your skin by washing with CHG (chlorahexidine gluconate) soap before surgery.  CHG is an antiseptic cleaner which kills germs and bonds with the skin to continue killing germs even after washing. Please DO NOT use if you have an allergy to CHG or antibacterial soaps.  If your skin becomes reddened/irritated stop using the CHG and inform your nurse when you arrive at Short Stay. Do not shave (including legs and underarms) for at least 48 hours prior to the first CHG shower.  You may shave your face/neck.  Please follow these instructions carefully:  1.  Shower with CHG Soap the night before surgery ONLY (DO NOT USE THE SOAP THE MORNING OF SURGERY).  2.  If you choose to wash your hair, wash your hair first as usual with your normal  shampoo.  3.  After you shampoo, rinse your hair and body thoroughly to remove the shampoo.                             4.  Use CHG as you would any other liquid soap.  You can apply chg directly to the skin and wash.  Gently with a scrungie or clean washcloth.  5.  Apply the CHG Soap to your body ONLY FROM THE NECK DOWN.   Do   not use on face/ open                           Wound or open sores. Avoid contact with eyes, ears mouth and   genitals (private parts).                       Wash face,  Genitals (private parts) with your normal soap.              6.  Wash thoroughly, paying special attention to the area where your    surgery  will be performed.  7.  Thoroughly rinse your body with warm water from the neck down.  8.  DO NOT shower/wash with your normal soap after using and rinsing off the CHG Soap.                9.  Pat yourself dry with a clean towel.            10.  Wear clean pajamas.            11.  Place clean sheets on your bed the night of your first shower and do not  sleep with pets. Day of Surgery : Do not apply any CHG, lotions/deodorants the morning of surgery.  Please wear clean clothes to the hospital/surgery center.  FAILURE TO FOLLOW THESE INSTRUCTIONS MAY RESULT IN THE CANCELLATION OF YOUR SURGERY  PATIENT SIGNATURE_________________________________  NURSE SIGNATURE__________________________________  ________________________________________________________________________

## 2023-12-28 ENCOUNTER — Encounter (HOSPITAL_COMMUNITY)
Admission: RE | Admit: 2023-12-28 | Discharge: 2023-12-28 | Disposition: A | Discharge status: Home / Self Care | Source: Ambulatory Visit | Attending: Urology | Admitting: Urology

## 2023-12-28 ENCOUNTER — Other Ambulatory Visit: Payer: Self-pay

## 2023-12-28 ENCOUNTER — Encounter (HOSPITAL_COMMUNITY): Payer: Self-pay

## 2023-12-28 VITALS — BP 114/97 | HR 75 | Temp 98.4°F | Resp 16 | Ht 65.0 in | Wt 164.9 lb

## 2023-12-28 DIAGNOSIS — Z01818 Encounter for other preprocedural examination: Secondary | ICD-10-CM

## 2023-12-28 DIAGNOSIS — E139 Other specified diabetes mellitus without complications: Secondary | ICD-10-CM

## 2023-12-28 DIAGNOSIS — Z01812 Encounter for preprocedural laboratory examination: Secondary | ICD-10-CM | POA: Diagnosis not present

## 2023-12-28 DIAGNOSIS — N35912 Unspecified bulbous urethral stricture, male: Secondary | ICD-10-CM | POA: Insufficient documentation

## 2023-12-28 DIAGNOSIS — Z87891 Personal history of nicotine dependence: Secondary | ICD-10-CM | POA: Diagnosis not present

## 2023-12-28 DIAGNOSIS — I129 Hypertensive chronic kidney disease with stage 1 through stage 4 chronic kidney disease, or unspecified chronic kidney disease: Secondary | ICD-10-CM | POA: Diagnosis not present

## 2023-12-28 DIAGNOSIS — E1122 Type 2 diabetes mellitus with diabetic chronic kidney disease: Secondary | ICD-10-CM | POA: Diagnosis not present

## 2023-12-28 DIAGNOSIS — N189 Chronic kidney disease, unspecified: Secondary | ICD-10-CM | POA: Insufficient documentation

## 2023-12-28 DIAGNOSIS — G309 Alzheimer's disease, unspecified: Secondary | ICD-10-CM | POA: Insufficient documentation

## 2023-12-28 DIAGNOSIS — F0280 Dementia in other diseases classified elsewhere without behavioral disturbance: Secondary | ICD-10-CM | POA: Insufficient documentation

## 2023-12-28 LAB — CBC
HCT: 43.5 % (ref 39.0–52.0)
Hemoglobin: 13.9 g/dL (ref 13.0–17.0)
MCH: 34 pg (ref 26.0–34.0)
MCHC: 32 g/dL (ref 30.0–36.0)
MCV: 106.4 fL — ABNORMAL HIGH (ref 80.0–100.0)
Platelets: 210 K/uL (ref 150–400)
RBC: 4.09 MIL/uL — ABNORMAL LOW (ref 4.22–5.81)
RDW: 13.9 % (ref 11.5–15.5)
WBC: 9.9 K/uL (ref 4.0–10.5)
nRBC: 0 % (ref 0.0–0.2)

## 2023-12-28 LAB — GLUCOSE, CAPILLARY: Glucose-Capillary: 119 mg/dL — ABNORMAL HIGH (ref 70–99)

## 2023-12-28 LAB — BASIC METABOLIC PANEL WITH GFR
Anion gap: 8 (ref 5–15)
BUN: 27 mg/dL — ABNORMAL HIGH (ref 8–23)
CO2: 28 mmol/L (ref 22–32)
Calcium: 9.3 mg/dL (ref 8.9–10.3)
Chloride: 105 mmol/L (ref 98–111)
Creatinine, Ser: 1.94 mg/dL — ABNORMAL HIGH (ref 0.61–1.24)
GFR, Estimated: 35 mL/min — ABNORMAL LOW (ref >60)
Glucose, Bld: 118 mg/dL — ABNORMAL HIGH (ref 70–99)
Potassium: 4.6 mmol/L (ref 3.5–5.1)
Sodium: 140 mmol/L (ref 135–145)

## 2023-12-28 LAB — HEMOGLOBIN A1C
Hgb A1c MFr Bld: 5.8 % — ABNORMAL HIGH (ref 4.8–5.6)
Mean Plasma Glucose: 119.76 mg/dL

## 2023-12-28 NOTE — Progress Notes (Addendum)
 Anesthesia Review:  PCP: Darleene shape LVO 11/03/23  Betty Jordan- PCP  Cardiologist : Mona LOV 12/13/2023   PPM/ ICD: Device Orders: Rep Notified:  Chest x-ray : EKG : 12/13/23  TAVR- 2022  Echo : 03/30/22  CT Cors- 2022  Stress test: 2021  Cardiac Cath :  2022   Activity level: can do a flight of stairs without difficutly  Sleep Study/ CPAP : none  Fasting Blood Sugar :      / Checks Blood Sugar -- times a day:     DM- type 2- Dexcom monitor  Hgba1c- 12/28/2023- 5.8 Farxiga - hold for 72 hour prior last dose on 12/30/23  Ozempic- last dose on ? Was prior to 12/25/2023 per wife Basaglar - Take 1/2 dose evening prior to surgery   Blood Thinner/ Instructions /Last Dose: ASA / Instructions/ Last Dose :    Pt has hearing aids which he does not wear.  PT wears depends.   Lives at [facility] with wife.   PT wants to make sure that skin graft comes from right cheek.       BMP done 12/28/23 routed to DR Shane on 12/28/23.

## 2023-12-29 DIAGNOSIS — R278 Other lack of coordination: Secondary | ICD-10-CM | POA: Diagnosis not present

## 2023-12-29 DIAGNOSIS — M6281 Muscle weakness (generalized): Secondary | ICD-10-CM | POA: Diagnosis not present

## 2023-12-29 DIAGNOSIS — R41841 Cognitive communication deficit: Secondary | ICD-10-CM | POA: Diagnosis not present

## 2023-12-29 DIAGNOSIS — G309 Alzheimer's disease, unspecified: Secondary | ICD-10-CM | POA: Diagnosis not present

## 2023-12-29 DIAGNOSIS — R2681 Unsteadiness on feet: Secondary | ICD-10-CM | POA: Diagnosis not present

## 2023-12-29 DIAGNOSIS — R296 Repeated falls: Secondary | ICD-10-CM | POA: Diagnosis not present

## 2023-12-29 NOTE — Progress Notes (Signed)
 Anesthesia Chart Review   Case: [id number] Date/Time: 01/03/24 1300   Procedures:      WINDY SHEFFIELD CHIMERA WITH BUCCAL GRAFT     CYSTOSCOPY WITH RETROGRADE URETHROGRAM   Anesthesia type: General   Diagnosis: Other stricture of bulbous urethra in male [N35.812]   Pre-op diagnosis: URETHRAL STRICTURE   Location: [facility] ROOM 03 / [facility]   Surgeons: Shane Steffan BROCKS, MD       DISCUSSION:78 y.o. former smoker with h/o HTN, s/p TAVR 2022, CKD, DM II, prostate cancer, urethral stricture scheduled for above procedure 01/03/24 with Dr. Steffan Shane.   Pt last seen by cardiology 12/13/2023. Per OV note, Mr. Lamson seems to be doing well from a cardiac standpoint although unfortunately has developed an Alzheimer's type dementia.  Blood pressure is well-controlled.  His lipids are at goal.  His A1c is well-controlled at 6.1%.  His TAVR gradients have been stable on echo and I think we can wait another year or 2 to repeat that.   Pt advised to hold Ozempic 1 week prior to procedure.   VS: BP (!) 114/97   Pulse 75   Temp 36.9 C (Oral)   Resp 16   Ht 5' 5 (1.651 m)   Wt 74.8 kg   SpO2 97%   BMI 27.44 kg/m   PROVIDERS: Jordan, [name], MD is PCP   Mona Kent, MD is Cardiologist  LABS: Labs reviewed: Acceptable for surgery. (all labs ordered are listed, but only abnormal results are displayed)  Labs Reviewed  BASIC METABOLIC PANEL WITH GFR - Abnormal; Notable for the following components:      Result Value   Glucose, Bld 118 (*)    BUN 27 (*)    Creatinine, Ser 1.94 (*)    GFR, Estimated 35 (*)    All other components within normal limits  CBC - Abnormal; Notable for the following components:   RBC 4.09 (*)    MCV 106.4 (*)    All other components within normal limits  HEMOGLOBIN A1C - Abnormal; Notable for the following components:   Hgb A1c MFr Bld 5.8 (*)    All other components within normal limits  GLUCOSE, CAPILLARY - Abnormal; Notable for the  following components:   Glucose-Capillary 119 (*)    All other components within normal limits     IMAGES:   EKG:   CV: Echo 03/30/22  1. Left ventricular ejection fraction, by estimation, is 55 to 60%. The  left ventricle has normal function. The left ventricle has no regional  wall motion abnormalities. There is moderate left ventricular hypertrophy.  Left ventricular diastolic  parameters are consistent with Grade II diastolic dysfunction  (pseudonormalization).   2. Right ventricular systolic function is normal. The right ventricular  size is normal.   3. Left atrial size was mildly dilated.   4. Right atrial size was mildly dilated.   5. The mitral valve is normal in structure. Trivial mitral valve  regurgitation. No evidence of mitral stenosis.   6. The aortic valve has been repaired/replaced. Aortic valve  regurgitation is not visualized. No aortic stenosis is present. Echo  findings are consistent with normal structure and function of the aortic  valve prosthesis. Aortic valve area, by VTI  measures 1.84 cm. Aortic valve mean gradient measures 8.0 mmHg. Aortic  valve Vmax measures 2.17 m/s.   7. The inferior vena cava is normal in size with greater than 50%  respiratory variability, suggesting right atrial pressure of 3 mmHg.  Myocardial Perfusion 04/18/2019 The left ventricular ejection fraction is moderately decreased (30-44%). Nuclear stress EF is calculated at 41% but appears higher at around 50-55%. Baseline EKG showed T wave inversions in the inferolateral leads with no change from baseline EKG during infusion. The perfusion study is normal. This is a low risk study. Past Medical History:  Diagnosis Date   Acute blood loss anemia 06/27/2012   Anemia    Arthritis    Blood transfusion without reported diagnosis    CKD (chronic kidney disease)    Clotting disorder 11/20/21   Bleeding ulcer   Constipation due to pain medication    needs stool softner while  on pain medication   Diabetes mellitus    Duodenal ulcer 08/13/2015   2014  - Dr Kaplan/GI   Duodenal ulcer with hemorrhage 08/13/2015   2014  - Dr Kaplan/GI   GERD (gastroesophageal reflux disease)    H/O: osteoarthritis    Heart murmur    History of kidney stones    History of prostate cancer    History of urethral stricture    Hypercholesterolemia    Hypertension    Melena 06/19/2012   Prostate cancer (HCC)    prostate   S/P TAVR (transcatheter aortic valve replacement) 08/12/2020   s/p TAVR with a 29 mm Edwards Sapien 3 THV via the TF approach by Dr. Wonda and Dr. Lucas   Severe aortic stenosis    Spinal stenosis    Ulcer duodenal hemorrhage     Past Surgical History:  Procedure Laterality Date   ARTHROPLASTY  2004   right knee Dr. Melodi SANES SURGERY     BIOPSY  11/21/2021   Procedure: BIOPSY;  Surgeon: Legrand Victory LITTIE DOUGLAS, MD;  Location: [facility];  Service: Gastroenterology;;   CARDIAC CATHETERIZATION     CARDIAC VALVE REPLACEMENT     CARPAL TUNNEL RELEASE     right hand,wrist Dr. Leonor   CYSTOSCOPY N/A 09/20/2012   Procedure: PHYLLIS;  Surgeon: Noretta Ferrara, MD;  [facility];  Service: Urology;  Laterality: N/A;   ESOPHAGOGASTRODUODENOSCOPY N/A 11/21/2021   Procedure: ESOPHAGOGASTRODUODENOSCOPY (EGD);  Surgeon: Legrand Victory LITTIE DOUGLAS, MD;  Location: [facility];  Service: Gastroenterology;  Laterality: N/A;   ESOPHAGOGASTRODUODENOSCOPY (EGD) WITH PROPOFOL  N/A 11/30/2021   Procedure: ESOPHAGOGASTRODUODENOSCOPY (EGD) WITH PROPOFOL ;  Surgeon: Albertus Gordy HERO, MD;  [facility];  Service: Gastroenterology;  Laterality: N/A;   HEMOSTASIS CLIP PLACEMENT  11/30/2021   Procedure: HEMOSTASIS CLIP PLACEMENT;  Surgeon: Albertus Gordy HERO, MD;  [facility];  Service: Gastroenterology;;   INGUINAL HERNIA REPAIR Right 01/14/2022   Procedure: RIGHT OPEN INGUINAL HERNIA REPAIR WITH MESH;  Surgeon: Kinsinger, Herlene Righter, MD;  Location: [facility];   Service: General;  Laterality: Right;   JOINT REPLACEMENT Right    KNEE ARTHROSCOPY Right    LUMBAR LAMINECTOMY  02/08/2013   MOHS SURGERY     PROSTATECTOMY     radical   RIGHT/LEFT HEART CATH AND CORONARY ANGIOGRAPHY N/A 05/08/2020   Procedure: RIGHT/LEFT HEART CATH AND CORONARY ANGIOGRAPHY;  Surgeon: Wonda Sharper, MD;  Location: [facility] INVASIVE CV LAB;  Service: Cardiovascular;  Laterality: N/A;   SPINE SURGERY     TEE WITHOUT CARDIOVERSION N/A 08/12/2020   Procedure: TRANSESOPHAGEAL ECHOCARDIOGRAM (TEE);  Surgeon: Wonda Sharper, MD;  Location: [facility] INVASIVE CV LAB;  Service: Open Heart Surgery;  Laterality: N/A;   TOOTH EXTRACTION  12/2021   TRANSCATHETER AORTIC VALVE REPLACEMENT, TRANSFEMORAL N/A 08/12/2020   Procedure: TRANSCATHETER AORTIC  VALVE REPLACEMENT, TRANSFEMORAL;  Surgeon: Wonda Sharper, MD;  Location: [facility] INVASIVE CV LAB;  Service: Open Heart Surgery;  Laterality: N/A;   VASECTOMY  1985    MEDICATIONS:  amLODipine  (NORVASC ) 2.5 MG tablet   atorvastatin  (LIPITOR) 40 MG tablet   BD PEN NEEDLE NANO 2ND GEN 32G X 4 MM MISC   calcium  carbonate (TUMS - DOSED IN MG ELEMENTAL CALCIUM ) 500 MG chewable tablet   Cholecalciferol (VITAMIN D3) 1000 units CAPS   citalopram  (CELEXA ) 10 MG tablet   Coenzyme Q10 (COQ10) 100 MG CAPS   Continuous Glucose Receiver (DEXCOM G7 RECEIVER) DEVI   Continuous Glucose Sensor (DEXCOM G7 SENSOR) MISC   CRANBERRY PO   donepezil  (ARICEPT ) 10 MG tablet   FARXIGA  10 MG TABS tablet   ferrous gluconate  (FERGON) 324 MG tablet   glucose 4 GM chewable tablet   Insulin  Glargine (BASAGLAR  KWIKPEN) 100 UNIT/ML   losartan -hydrochlorothiazide  (HYZAAR) 100-25 MG tablet   Melatonin 5 MG CHEW   metoprolol  succinate (TOPROL -XL) 50 MG 24 hr tablet   NON FORMULARY   OZEMPIC, 0.25 OR 0.5 MG/DOSE, 2 MG/3ML SOPN   pantoprazole  (PROTONIX ) 40 MG tablet   TYLENOL  500 MG tablet   No current facility-administered medications for this encounter.     Harlene Hoots  Ward, PA-C [facility] [phone]

## 2023-12-29 NOTE — Anesthesia Preprocedure Evaluation (Addendum)
 Anesthesia Evaluation  Patient identified by MRN, date of birth, ID band Patient awake    Reviewed: Allergy & Precautions, H&P , NPO status , Patient's Chart, lab work & pertinent test results  Airway Mallampati: II   Neck ROM: full    Dental   Pulmonary former smoker   breath sounds clear to auscultation       Cardiovascular hypertension, + Valvular Problems/Murmurs  Rhythm:regular Rate:Normal  S/p TAVR 2022  TTE (03/30/22): EF 60%, good functioning AV.   Neuro/Psych  PSYCHIATRIC DISORDERS     Dementia    GI/Hepatic PUD,GERD  ,,  Endo/Other  diabetes, Type 2    Renal/GU Renal InsufficiencyRenal disease     Musculoskeletal  (+) Arthritis ,    Abdominal   Peds  Hematology   Anesthesia Other Findings   Reproductive/Obstetrics                              Anesthesia Physical Anesthesia Plan  ASA: 3  Anesthesia Plan: General   Post-op Pain Management:    Induction: Intravenous  PONV Risk Score and Plan: 2 and Ondansetron , Dexamethasone  and Treatment may vary due to age or medical condition  Airway Management Planned: Oral ETT  Additional Equipment:   Intra-op Plan:   Post-operative Plan: Extubation in OR  Informed Consent: I have reviewed the patients History and Physical, chart, labs and discussed the procedure including the risks, benefits and alternatives for the proposed anesthesia with the patient or authorized representative who has indicated his/her understanding and acceptance.     Dental advisory given  Plan Discussed with: CRNA, Anesthesiologist and Surgeon  Anesthesia Plan Comments: (See PAT note 12/28/2023)         Anesthesia Quick Evaluation

## 2024-01-01 NOTE — H&P (Signed)
 H&P   Chief Complaint: Urethral stricture  History of Present Illness:  78 year old male with a history of prostate cancer diagnosed in 2001 managed with retropubic radical prostatectomy pathology T2a N0 M0 X. PSAs have been undetectable. Does have a history of bladder contracture and stricture postsurgery continues to dilate once or twice a week.  Prostate cancer: 09/28/2023: PSA have been stable no recurrence  Urethral stricture: 09/28/2023: no longer catheterize, had UTI. pt had several UTIs, PVR 0. Pt has 3 UTIs past year. No straining, no week stream. Ht had pain at the ned of the stream. Not currently catheterizing 12/07/23: cystoscopy showed pendulous urethral stx 12/09/23: RUG today urethroplasty discussion, proximal pendulous to distal bulbar urethra see imaging 01/03/24: urethroplasty today     Past Medical History:  Diagnosis Date   Acute blood loss anemia 06/27/2012   Anemia    Arthritis    Blood transfusion without reported diagnosis    CKD (chronic kidney disease)    Clotting disorder 11/20/21   Bleeding ulcer   Constipation due to pain medication    needs stool softner while on pain medication   Diabetes mellitus    Duodenal ulcer 08/13/2015   2014  - Dr Kaplan/GI   Duodenal ulcer with hemorrhage 08/13/2015   2014  - Dr Kaplan/GI   GERD (gastroesophageal reflux disease)    H/O: osteoarthritis    Heart murmur    History of kidney stones    History of prostate cancer    History of urethral stricture    Hypercholesterolemia    Hypertension    Melena 06/19/2012   Prostate cancer (HCC)    prostate   S/P TAVR (transcatheter aortic valve replacement) 08/12/2020   s/p TAVR with a 29 mm Edwards Sapien 3 THV via the TF approach by Dr. Wonda and Dr. Lucas   Severe aortic stenosis    Spinal stenosis    Ulcer duodenal hemorrhage    Past Surgical History:  Procedure Laterality Date   ARTHROPLASTY  2004   right knee Dr. Melodi SANES SURGERY     BIOPSY   11/21/2021   Procedure: BIOPSY;  Surgeon: Legrand Victory LITTIE DOUGLAS, MD;  [facility];  Service: Gastroenterology;;   CARDIAC CATHETERIZATION     CARDIAC VALVE REPLACEMENT     CARPAL TUNNEL RELEASE     right hand,wrist Dr. Leonor   CYSTOSCOPY N/A 09/20/2012   Procedure: PHYLLIS;  Surgeon: Noretta Ferrara, MD;  [facility];  Service: Urology;  Laterality: N/A;   ESOPHAGOGASTRODUODENOSCOPY N/A 11/21/2021   Procedure: ESOPHAGOGASTRODUODENOSCOPY (EGD);  Surgeon: Legrand Victory LITTIE DOUGLAS, MD;  Location: [facility];  Service: Gastroenterology;  Laterality: N/A;   ESOPHAGOGASTRODUODENOSCOPY (EGD) WITH PROPOFOL  N/A 11/30/2021   Procedure: ESOPHAGOGASTRODUODENOSCOPY (EGD) WITH PROPOFOL ;  Surgeon: Albertus Gordy HERO, MD;  [facility];  Service: Gastroenterology;  Laterality: N/A;   HEMOSTASIS CLIP PLACEMENT  11/30/2021   Procedure: HEMOSTASIS CLIP PLACEMENT;  Surgeon: Albertus Gordy HERO, MD;  [facility];  Service: Gastroenterology;;   INGUINAL HERNIA REPAIR Right 01/14/2022   Procedure: RIGHT OPEN INGUINAL HERNIA REPAIR WITH MESH;  Surgeon: Kinsinger, Herlene Righter, MD;  Location: [facility];  Service: General;  Laterality: Right;   JOINT REPLACEMENT Right    KNEE ARTHROSCOPY Right    LUMBAR LAMINECTOMY  02/08/2013   MOHS SURGERY     PROSTATECTOMY     radical   RIGHT/LEFT HEART CATH AND CORONARY ANGIOGRAPHY N/A 05/08/2020   Procedure: RIGHT/LEFT HEART CATH AND CORONARY ANGIOGRAPHY;  Surgeon: Wonda Sharper, MD;  [facility] INVASIVE CV LAB;  Service: Cardiovascular;  Laterality: N/A;   SPINE SURGERY     TEE WITHOUT CARDIOVERSION N/A 08/12/2020   Procedure: TRANSESOPHAGEAL ECHOCARDIOGRAM (TEE);  Surgeon: Wonda Sharper, MD;  Location: [facility] INVASIVE CV LAB;  Service: Open Heart Surgery;  Laterality: N/A;   TOOTH EXTRACTION  12/2021   TRANSCATHETER AORTIC VALVE REPLACEMENT, TRANSFEMORAL N/A 08/12/2020   Procedure: TRANSCATHETER AORTIC VALVE REPLACEMENT, TRANSFEMORAL;  Surgeon: Wonda Sharper, MD;  Location: [facility] INVASIVE CV LAB;  Service: Open Heart Surgery;  Laterality: N/A;   VASECTOMY  1985    Home Medications:  No medications prior to admission.   Allergies:  Allergies  Allergen Reactions   Aspirin  Other (See Comments)    History of bleeding ulcers!!! Only Tylenol  is tolerated.   Diclofenac  Other (See Comments)    History of bleeding ulcers!!! Only Tylenol  is tolerated.   Excedrin Tension Headache [Acetaminophen -Caffeine] Other (See Comments)    History of bleeding ulcers!!! Only Tylenol  is tolerated.   Motrin [Ibuprofen] Other (See Comments)    History of bleeding ulcers!!! Only Tylenol  is tolerated.   Nsaids Other (See Comments)    History of bleeding ulcers!!! Only Tylenol  is tolerated.    Family History  Problem Relation Age of Onset   Heart disease Mother    Heart failure Mother    Heart disease Father    Diabetes Father    Colon cancer Neg Hx    Esophageal cancer Neg Hx    Inflammatory bowel disease Neg Hx    Liver disease Neg Hx    Pancreatic cancer Neg Hx    Rectal cancer Neg Hx    Stomach cancer Neg Hx    Social History:  reports that he quit smoking about 43 years ago. His smoking use included cigarettes. He started smoking about 63 years ago. He has a 30 pack-year smoking history. He has never used smokeless tobacco. He reports that he does not currently use alcohol. He reports that he does not use drugs.  ROS: A complete review of systems was performed.  All systems are negative except for pertinent findings as noted. ROS   Physical Exam:  Vital signs in last 24 hours:   General: NAD Respiratory: normal WOB on RA Cards: RRR per monitor   Laboratory Data:  No results found for this or any previous visit (from the past 24 hours). No results found for this or any previous visit (from the past 240 hours). Creatinine: Recent Labs    12/28/23 1453  CREATININE 1.94*    Impression/Assessment:  Urethral stricture: Retrograde  urethrogram demonstrates proximal pendulous to bulbar urethral stricture about 2 to 3 cm in length patient is options including Optilume, dilation and urethroplasty. Patient will need a buccal graft dorsal onlay urethroplasty.  We discussed risk benefits alternatives to procedure including bleeding infection demonstrating structures need for catheter postoperatively for 3 weeks possible risk for infection urethral fistula stricture recurrence and rhabdomyolysis. Patient voiced understanding consent was obtained.   Ucx + fro enterococcus sent amoxicillin     Plan:  Plan for urethroplasty with buccal graft  Steffan JAYSON Pea 01/01/2024, 7:32 PM

## 2024-01-03 ENCOUNTER — Encounter (HOSPITAL_COMMUNITY)
Admission: RE | Disposition: A | Discharge status: Home / Self Care | Payer: Self-pay | Source: Ambulatory Visit | Attending: Urology

## 2024-01-03 ENCOUNTER — Ambulatory Visit (HOSPITAL_COMMUNITY)
Admission: RE | Admit: 2024-01-03 | Discharge: 2024-01-03 | Disposition: A | Discharge status: Home / Self Care | Source: Ambulatory Visit | Attending: Urology | Admitting: Urology

## 2024-01-03 ENCOUNTER — Ambulatory Visit (HOSPITAL_COMMUNITY): Payer: Self-pay | Admitting: Physician Assistant

## 2024-01-03 ENCOUNTER — Ambulatory Visit (HOSPITAL_BASED_OUTPATIENT_CLINIC_OR_DEPARTMENT_OTHER): Payer: Self-pay | Admitting: Certified Registered Nurse Anesthetist

## 2024-01-03 ENCOUNTER — Encounter (HOSPITAL_COMMUNITY): Payer: Self-pay | Admitting: Urology

## 2024-01-03 ENCOUNTER — Other Ambulatory Visit (HOSPITAL_COMMUNITY): Payer: Self-pay

## 2024-01-03 DIAGNOSIS — Z87891 Personal history of nicotine dependence: Secondary | ICD-10-CM

## 2024-01-03 DIAGNOSIS — I10 Essential (primary) hypertension: Secondary | ICD-10-CM | POA: Diagnosis not present

## 2024-01-03 DIAGNOSIS — I129 Hypertensive chronic kidney disease with stage 1 through stage 4 chronic kidney disease, or unspecified chronic kidney disease: Secondary | ICD-10-CM | POA: Diagnosis not present

## 2024-01-03 DIAGNOSIS — N35919 Unspecified urethral stricture, male, unspecified site: Secondary | ICD-10-CM

## 2024-01-03 DIAGNOSIS — E119 Type 2 diabetes mellitus without complications: Secondary | ICD-10-CM | POA: Diagnosis not present

## 2024-01-03 DIAGNOSIS — N1832 Chronic kidney disease, stage 3b: Secondary | ICD-10-CM | POA: Diagnosis not present

## 2024-01-03 DIAGNOSIS — N35812 Other urethral bulbous stricture, male: Secondary | ICD-10-CM | POA: Diagnosis present

## 2024-01-03 DIAGNOSIS — Z01818 Encounter for other preprocedural examination: Secondary | ICD-10-CM

## 2024-01-03 DIAGNOSIS — E1122 Type 2 diabetes mellitus with diabetic chronic kidney disease: Secondary | ICD-10-CM | POA: Diagnosis not present

## 2024-01-03 DIAGNOSIS — N35912 Unspecified bulbous urethral stricture, male: Secondary | ICD-10-CM | POA: Diagnosis not present

## 2024-01-03 HISTORY — PX: CYSTOSCOPY WITH RETROGRADE URETHROGRAM: SHX6309

## 2024-01-03 HISTORY — PX: URETHROPLASTY,TRANSURETHRAL,  INLAY WITH BUCCAL GRAFT: SHX7653

## 2024-01-03 LAB — GLUCOSE, CAPILLARY
Glucose-Capillary: 136 mg/dL — ABNORMAL HIGH (ref 70–99)
Glucose-Capillary: 146 mg/dL — ABNORMAL HIGH (ref 70–99)

## 2024-01-03 SURGERY — URETHROPLASTY,TRANSURETHRAL,  INLAY WITH BUCCAL GRAFT
Anesthesia: General | Site: Perineum

## 2024-01-03 MED ORDER — LACTATED RINGERS IV SOLN: INTRAVENOUS | Status: DC

## 2024-01-03 MED ORDER — HYOSCYAMINE SULFATE 0.125 MG PO TABS
0.1250 mg | ORAL_TABLET | ORAL | 0 refills | Status: DC | PRN
  Filled 2024-01-03: qty 30, 5d supply, fill #0

## 2024-01-03 MED ORDER — LIDOCAINE HCL 2 % IJ SOLN
INTRAMUSCULAR | Status: AC
  Filled 2024-01-03: qty 20

## 2024-01-03 MED ORDER — EPHEDRINE SULFATE-NACL 50-0.9 MG/10ML-% IV SOSY
PREFILLED_SYRINGE | INTRAVENOUS | Status: DC | PRN
  Administered 2024-01-03: 7.5 mg via INTRAVENOUS

## 2024-01-03 MED ORDER — PHENYLEPHRINE 80 MCG/ML (10ML) SYRINGE FOR IV PUSH (FOR BLOOD PRESSURE SUPPORT)
PREFILLED_SYRINGE | INTRAVENOUS | Status: DC | PRN
  Administered 2024-01-03 (×2): 120 ug via INTRAVENOUS

## 2024-01-03 MED ORDER — BUPIVACAINE-EPINEPHRINE (PF) 0.5% -1:200000 IJ SOLN
INTRAMUSCULAR | Status: AC
Start: 2024-01-03 — End: 2024-01-03
  Filled 2024-01-03: qty 30

## 2024-01-03 MED ORDER — IOHEXOL 300 MG/ML  SOLN: INTRAMUSCULAR | Status: DC | PRN

## 2024-01-03 MED ORDER — FENTANYL CITRATE (PF) 100 MCG/2ML IJ SOLN
INTRAMUSCULAR | Status: DC | PRN
  Administered 2024-01-03: 100 ug via INTRAVENOUS

## 2024-01-03 MED ORDER — ONDANSETRON HCL 4 MG/2ML IJ SOLN
INTRAMUSCULAR | Status: AC
  Filled 2024-01-03: qty 2

## 2024-01-03 MED ORDER — ONDANSETRON HCL 4 MG/2ML IJ SOLN
INTRAMUSCULAR | Status: DC | PRN
  Administered 2024-01-03: 4 mg via INTRAVENOUS

## 2024-01-03 MED ORDER — LIDOCAINE HCL (PF) 2 % IJ SOLN
INTRAMUSCULAR | Status: DC | PRN
  Administered 2024-01-03: 20 mL via INTRADERMAL

## 2024-01-03 MED ORDER — PROPOFOL 10 MG/ML IV BOLUS
INTRAVENOUS | Status: AC
  Filled 2024-01-03: qty 20

## 2024-01-03 MED ORDER — SUGAMMADEX SODIUM 200 MG/2ML IV SOLN
INTRAVENOUS | Status: AC
  Filled 2024-01-03: qty 2

## 2024-01-03 MED ORDER — BACITRACIN ZINC 500 UNIT/GM EX OINT
TOPICAL_OINTMENT | CUTANEOUS | Status: DC | PRN
  Administered 2024-01-03: 1 via TOPICAL

## 2024-01-03 MED ORDER — DEXAMETHASONE SODIUM PHOSPHATE 4 MG/ML IJ SOLN
INTRAMUSCULAR | Status: DC | PRN
  Administered 2024-01-03: 5 mg via INTRAVENOUS

## 2024-01-03 MED ORDER — BUPIVACAINE HCL (PF) 0.5 % IJ SOLN
INTRAMUSCULAR | Status: DC | PRN
  Administered 2024-01-03: 30 mL

## 2024-01-03 MED ORDER — ONDANSETRON HCL 4 MG/2ML IJ SOLN: 4.0000 mg | Freq: Four times a day (QID) | INTRAMUSCULAR | Status: DC | PRN

## 2024-01-03 MED ORDER — INSULIN ASPART 100 UNIT/ML IJ SOLN: 0.0000 [IU] | INTRAMUSCULAR | Status: DC | PRN

## 2024-01-03 MED ORDER — ORAL CARE MOUTH RINSE: 15.0000 mL | Freq: Once | OROMUCOSAL | Status: AC

## 2024-01-03 MED ORDER — SODIUM CHLORIDE 0.9 % IV SOLN
2.0000 g | INTRAVENOUS | Status: AC
  Administered 2024-01-03: 2 g via INTRAVENOUS
  Filled 2024-01-03: qty 2000

## 2024-01-03 MED ORDER — GENTAMICIN SULFATE 40 MG/ML IJ SOLN
5.0000 mg/kg | INTRAVENOUS | Status: AC
  Administered 2024-01-03: 374 mg via INTRAVENOUS
  Filled 2024-01-03: qty 9.25

## 2024-01-03 MED ORDER — SUGAMMADEX SODIUM 200 MG/2ML IV SOLN
INTRAVENOUS | Status: DC | PRN
  Administered 2024-01-03: 200 mg via INTRAVENOUS

## 2024-01-03 MED ORDER — CHLORHEXIDINE GLUCONATE 0.12 % MT SOLN
15.0000 mL | Freq: Once | OROMUCOSAL | Status: AC
  Administered 2024-01-03: 15 mL via OROMUCOSAL

## 2024-01-03 MED ORDER — PHENYLEPHRINE 80 MCG/ML (10ML) SYRINGE FOR IV PUSH (FOR BLOOD PRESSURE SUPPORT)
PREFILLED_SYRINGE | INTRAVENOUS | Status: AC
  Filled 2024-01-03: qty 10

## 2024-01-03 MED ORDER — FENTANYL CITRATE (PF) 100 MCG/2ML IJ SOLN
INTRAMUSCULAR | Status: AC
  Filled 2024-01-03: qty 2

## 2024-01-03 MED ORDER — PROPOFOL 10 MG/ML IV BOLUS
INTRAVENOUS | Status: DC | PRN
  Administered 2024-01-03: 80 mg via INTRAVENOUS

## 2024-01-03 MED ORDER — EPHEDRINE 5 MG/ML INJ
INTRAVENOUS | Status: AC
Start: 2024-01-03 — End: 2024-01-03
  Filled 2024-01-03: qty 5

## 2024-01-03 MED ORDER — ROCURONIUM BROMIDE 10 MG/ML (PF) SYRINGE
PREFILLED_SYRINGE | INTRAVENOUS | Status: AC
  Filled 2024-01-03: qty 10

## 2024-01-03 MED ORDER — METHOCARBAMOL 500 MG PO TABS
500.0000 mg | ORAL_TABLET | Freq: Three times a day (TID) | ORAL | 0 refills | Status: AC
  Filled 2024-01-03: qty 21, 7d supply, fill #0

## 2024-01-03 MED ORDER — OXYCODONE HCL 5 MG PO TABS
5.0000 mg | ORAL_TABLET | Freq: Three times a day (TID) | ORAL | 0 refills | Status: AC | PRN
  Filled 2024-01-03: qty 10, 4d supply, fill #0

## 2024-01-03 MED ORDER — OXYCODONE HCL 5 MG PO TABS: 5.0000 mg | ORAL_TABLET | Freq: Once | ORAL | Status: DC | PRN

## 2024-01-03 MED ORDER — CHLORHEXIDINE GLUCONATE 0.12 % MT SOLN
15.0000 mL | Freq: Two times a day (BID) | OROMUCOSAL | 0 refills | Status: AC
  Filled 2024-01-03: qty 473, 16d supply, fill #0

## 2024-01-03 MED ORDER — ROCURONIUM BROMIDE 10 MG/ML (PF) SYRINGE
PREFILLED_SYRINGE | INTRAVENOUS | Status: DC | PRN
  Administered 2024-01-03: 20 mg via INTRAVENOUS
  Administered 2024-01-03: 50 mg via INTRAVENOUS

## 2024-01-03 MED ORDER — SODIUM CHLORIDE 0.9 % IR SOLN
Status: DC | PRN
  Administered 2024-01-03: 1000 mL via INTRAVESICAL

## 2024-01-03 MED ORDER — BACITRACIN ZINC 500 UNIT/GM EX OINT
TOPICAL_OINTMENT | CUTANEOUS | Status: AC
Start: 2024-01-03 — End: 2024-01-03
  Filled 2024-01-03: qty 28.35

## 2024-01-03 MED ORDER — OXYCODONE HCL 5 MG/5ML PO SOLN: 5.0000 mg | Freq: Once | ORAL | Status: DC | PRN

## 2024-01-03 MED ORDER — SULFAMETHOXAZOLE-TRIMETHOPRIM 800-160 MG PO TABS
1.0000 | ORAL_TABLET | Freq: Two times a day (BID) | ORAL | 0 refills | Status: AC
  Filled 2024-01-03: qty 6, 3d supply, fill #0

## 2024-01-03 MED ORDER — FENTANYL CITRATE (PF) 50 MCG/ML IJ SOSY: 25.0000 ug | PREFILLED_SYRINGE | INTRAMUSCULAR | Status: DC | PRN

## 2024-01-03 MED ORDER — BUPIVACAINE HCL (PF) 0.5 % IJ SOLN
INTRAMUSCULAR | Status: AC
  Filled 2024-01-03: qty 30

## 2024-01-03 SURGICAL SUPPLY — 76 items
BAG COUNTER SPONGE SURGICOUNT (BAG) IMPLANT
BAG URINE DRAIN 2000ML AR STRL (UROLOGICAL SUPPLIES) ×3 IMPLANT
BLADE HEX COATED 2.75 (ELECTRODE) ×3 IMPLANT
BLADE SURG 15 STRL LF DISP TIS (BLADE) ×6 IMPLANT
BLADE SURG SZ10 CARB STEEL (BLADE) ×3 IMPLANT
BNDG GAUZE DERMACEA FLUFF 4 (GAUZE/BANDAGES/DRESSINGS) ×3 IMPLANT
BRIEF MESH DISP LRG (UNDERPADS AND DIAPERS) ×3 IMPLANT
CATH FOLEY 2W COUNCIL 5CC 16FR (CATHETERS) IMPLANT
CATH FOLEY 2WAY SLVR 5CC 14FR (CATHETERS) ×3 IMPLANT
CATH FOLEY 2WAY SLVR 5CC 16FR (CATHETERS) IMPLANT
CATH FOLEY 2WAY SLVR 5CC 18FR (CATHETERS) ×3 IMPLANT
CATH ROBINSON RED A/P 16FR (CATHETERS) IMPLANT
CATH ROBINSON RED A/P 18FR (CATHETERS) IMPLANT
CATH ROBINSON RED A/P 20FR (CATHETERS) IMPLANT
CATH SILICONE 14FRX5CC (CATHETERS) IMPLANT
CATH URET ROBINSON 24FR STRL (CATHETERS) IMPLANT
CATH URETL OPEN 5X70 (CATHETERS) IMPLANT
CATH URETL OPEN END 6FR 70 (CATHETERS) ×3 IMPLANT
CLAMP SUTURE YELLOW 5 PAIRS (MISCELLANEOUS) IMPLANT
CORD BIPOLAR FORCEPS 12FT (ELECTRODE) IMPLANT
COUNTER NDL 20CT MAGNET RED (NEEDLE) IMPLANT
COVER BACK TABLE 60X90IN (DRAPES) IMPLANT
COVER MAYO STAND STRL (DRAPES) ×3 IMPLANT
COVER SURGICAL LIGHT HANDLE (MISCELLANEOUS) ×3 IMPLANT
DERMABOND ADVANCED .7 DNX12 (GAUZE/BANDAGES/DRESSINGS) ×3 IMPLANT
DRAIN PENROSE 0.25X18 (DRAIN) IMPLANT
DRAPE INCISE IOBAN 66X45 STRL (DRAPES) ×3 IMPLANT
DRAPE POUCH INSTRU U-SHP 10X18 (DRAPES) IMPLANT
DRAPE SHEET LG 3/4 BI-LAMINATE (DRAPES) ×3 IMPLANT
DRAPE UNDERBUTTOCKS STRL (DISPOSABLE) ×3 IMPLANT
DRSG TELFA 3X8 NADH STRL (GAUZE/BANDAGES/DRESSINGS) ×6 IMPLANT
GAUZE 4X4 16PLY ~~LOC~~+RFID DBL (SPONGE) ×6 IMPLANT
GAUZE SPONGE 4X4 12PLY STRL (GAUZE/BANDAGES/DRESSINGS) IMPLANT
GLOVE SURG LX STRL 7.5 STRW (GLOVE) ×3 IMPLANT
GOWN STRL REUS W/ TWL LRG LVL3 (GOWN DISPOSABLE) ×12 IMPLANT
GOWN STRL REUS W/ TWL XL LVL3 (GOWN DISPOSABLE) ×3 IMPLANT
GUIDEWIRE STR DUAL SENSOR (WIRE) IMPLANT
GUIDEWIRE STRT TIP .038X150X3 (WIRE) IMPLANT
GUIDEWIRE ZIPWRE .038 STRAIGHT (WIRE) ×3 IMPLANT
HOLDER FOLEY CATH W/STRAP (MISCELLANEOUS) ×3 IMPLANT
KIT BASIN OR (CUSTOM PROCEDURE TRAY) ×3 IMPLANT
KIT TURNOVER KIT A (KITS) ×3 IMPLANT
MARKER SKIN DUAL TIP RULER LAB (MISCELLANEOUS) ×3 IMPLANT
NDL HYPO 25X1 1.5 SAFETY (NEEDLE) ×24 IMPLANT
PACK CYSTO (CUSTOM PROCEDURE TRAY) ×3 IMPLANT
PAD PREP 24X48 CUFFED NSTRL (MISCELLANEOUS) ×3 IMPLANT
PAK SCROTO (SET/KITS/TRAYS/PACK) IMPLANT
PENCIL SMOKE EVACUATOR (MISCELLANEOUS) IMPLANT
PLUG CATH AND CAP STRL 200 (CATHETERS) ×3 IMPLANT
RETRACTOR LONE STAR DISPOSABLE (INSTRUMENTS) IMPLANT
RETRACTOR STAY HOOK 5MM (MISCELLANEOUS) ×3 IMPLANT
SHEET LAVH (DRAPES) ×3 IMPLANT
SOL PREP POV-IOD 4OZ 10% (MISCELLANEOUS) ×3 IMPLANT
SPONGE T-LAP 4X18 ~~LOC~~+RFID (SPONGE) ×3 IMPLANT
STAPLER SKIN PROX 35W (STAPLE) ×3 IMPLANT
STRIP CLOSURE SKIN 1/2X4 (GAUZE/BANDAGES/DRESSINGS) ×6 IMPLANT
SUCTION TUBE FRAZIER 12FR DISP (SUCTIONS) ×3 IMPLANT
SUT CHROMIC 2 0 SH (SUTURE) IMPLANT
SUT CHROMIC 3 0 PS 2 (SUTURE) IMPLANT
SUT CHROMIC 3 0 SH 27 (SUTURE) IMPLANT
SUT CHROMIC 4 0 SH 27 (SUTURE) ×3 IMPLANT
SUT MNCRL AB 4-0 PS2 18 (SUTURE) IMPLANT
SUT PDS AB 4-0 RB1 27 (SUTURE) IMPLANT
SUT PDS PLUS 5 RB-2 (SUTURE) IMPLANT
SUT PDS PLUS AB 5-0 RB-1 (SUTURE) IMPLANT
SUT SILK 2 0 30 PSL (SUTURE) IMPLANT
SUT SILK 3 0 SH CR/8 (SUTURE) IMPLANT
SUT VIC AB 2-0 SH 27X BRD (SUTURE) IMPLANT
SUT VIC AB 3-0 SH 27XBRD (SUTURE) IMPLANT
SUT VIC AB 4-0 RB1 27XBRD (SUTURE) ×3 IMPLANT
SYR 10ML LL (SYRINGE) ×3 IMPLANT
TOWEL OR DSP ST BLU DLX 10/PK (DISPOSABLE) ×3 IMPLANT
TUBE FEEDING LG 5FR L90CM PUR (TUBING) IMPLANT
WATER STERILE IRR 1000ML POUR (IV SOLUTION) ×3 IMPLANT
YANKAUER SUCT BULB TIP 10FT TU (MISCELLANEOUS) ×3 IMPLANT
YANKAUER SUCT BULB TIP NO VENT (SUCTIONS) IMPLANT

## 2024-01-03 NOTE — Op Note (Signed)
 Preoperative diagnosis:  urethral stricture    Postoperative diagnosis:  Same    Procedure: dorsal onlay Urethroplasty with autologous buccal mucosa graft 4 cm Buccal graft mucosa havest Cystourethroscopy   Surgeon: Steffan Pea 1st assistant:   Anesthesia: General   Complications: None   Intraoperative findings: The patient had a long stricture measuring approximately 2.5 cm. The stricture was repaired using approximately 4cm  cm of buccal mucosa.   EBL: 75cc    Specimens: None   Indication:  Daryl Webb is a 78 y.o.  patient with a recurrent dense urethral stricture.  After reviewing the management options for treatment, he elected to proceed with the above surgical procedure(s). We have discussed the potential benefits and risks of the procedure, side effects of the proposed treatment, the likelihood of the patient achieving the goals of the procedure, and any potential problems that might occur during the procedure or recuperation. Informed consent has been obtained.   Description of procedure: An assistant was required for this surgical procedure.  The duties of the assistant included but were not limited to suctioning, tissue retraction, and suturing. This procedure would not be able to be performed without an geophysicist/field seismologist.  The patient was taken to the operating room and general anesthesia was induced.  The patient was placed in the high dorsal lithotomy position, the lower back and buttocks were appropriately padded. He was then prepped and draped in the usual sterile fashion, and preoperative antibiotics were administered. A preoperative time-out was performed.    Cystourethroscopy was performed using a 17 French flexible cystoscope noting a normal urethra to the bulbar region where there was a dense urethral stricture unable to be passed by the 17 French scope. We then passed a 6 French open-ended ureteral catheter through the scope and the stricture and then on  into the bladder lumen.  I then passed a 20 French red rubber catheter into the patient's urethra and advanced it to an area where it no longer was easily advanced and measured this as our distal margin. We then made a midline incision between the scrotum and the anus and took this down to the bulbospongiosus muscle this was then opened exposing the urethra. A [place] retractor was then placed in the incision edges held open with hooks.   We dissected the urethra clear anteriorly both proximally and distally to the area where the catheter no longer advanced. The urethra was then dissected and rolled to the patient's right. The urethra was then mobilized to include both proximal and distal aspects of the stricture. Once the urethra was mobile we made an incision at the tip of the catheter on the dorsal aspect of the urethra. We then opened up the urethra dorsally through the stricture. Bougies were then used to calibrate the urethra to 24fr. Performed cystoscopy again noting that the urethra just proximal to the membranous urethra was unhealthy appearing, but no clear stricture. The strictured segment that was open measured approximate 2.5 cm. We then placed our proximal sutures clean the apex 5:00, 7:00 8:00 and 4:00 they all included the corpora and the bites.   We then turned our attention to the mouth and performed a buccal mucosa harvest. This was performed by first placing the sluder retractor to open the patient's mouth and pull the tongue away. 3 silk stay sutures were placed on the mucosa adjacent to the lips and corner of the mouth. These were used to retract the cheeks. I then used a ruler and  measured 4 cm just inferior to Stensen's duct. We then measured approximately 15 mm wide. I then injected 1% lidocaine  with epinephrine  into the measured area and with a 15 blade incised the graft. The 15 blade was then used to remove the entirety of the graft not entering the muscle. The bleeding areas were then  cauterized. Once the graft was free I then reapproximated the edges using a 3-0 chromic suture in a running fashion. Hemostasis was noted to be excellent. We quickly Betadine the area and then packed the cheek so as to provide excellent hemostasis. Then we took the graft to the back table and defatted the graft extensively. We then placed a normal saline solution.    Dorsal onlay Graft was then laid on the ventral aspect of the corporal bodies with the mucosal aspect of the graft facing the lumen.  Using 5-0 PDS the right aspect of the graft was sewn to the right aspect of ureterotomy in a running fashion along the length of the graft, the suture included components of the corporal body as well as the 2 edges.  4-0 PDS was used to throw of quilting sutures and 2 rows along the graft length.  Next simple interrupted sutures were thrown at the 5, 6, 7 o'clock positions in the most proximal aspect of the graft and urethrotomy.  11, 12, 1 o'clock sutures were thrown in simple interrupted fashion including the corpora at the distal aspect of the graft and the ureterotomy.  Lastly a 5-0 PDS suture was used to sew down the left side of the graft and the urethrotomy in a running fashion including aspects of the corporotomy.  Prior to complete closure of the urethrotomy a 14 French silicone catheter was placed through the urethra into the bladder being guided by the fingers the hallway clear urine returned.  10 cc of normal saline was inflated into the balloon.  The urethra was then closed with 5-0 PDS.  A second reinforcing stitch using 5-0 PDS was then sewn on the left side of the urethra to the corporal bodies to cover our urethrotomy closure.     I then closed the bulbar urethral muscle and overlying tissue with a 2-0 Vicryl in a running fashion. I then closed the Colles' fascia with a 2-0 Vicryl in a running fashion. I then closed the subcutaneous tissue in a interrupted fashion using 2-0 Vicryl. The skin was  then reapproximated with a 3-0 subcuticular Chromic gut stitch. Dermabond was then applied. Prior to skin closure corpus and Marcaine  was injected into the incision.   The packing was then subsequently removed from the patient's mouth and hemostasis was noted to be excellent. He was subsequently extubated and returned to PACU in stable condition.     Steffan Pea

## 2024-01-03 NOTE — Discharge Instructions (Addendum)
 Discharge instructions following urethroplasty   Call your doctor for: Fever is greater than 100.5 Severe nausea or vomiting Increasing pain not controlled by pain medication Increasing redness or drainage from incisions Catheter no longer draining  Clots in the catheter that are larger than the size of a quarter   DO NOT LET AN ER DOCTOR TOUCH THE CATHETER ONLY A UROLOGIST CAN TROUBLE SHOOT THE CATHETER  Medication: - oxycodone  - methocarbamol   - oxybutynin (take for the next 2 weeks) - miralax  - bactrim    The number for questions or concerns is [phone]  Activity level: No lifting greater than 10 pounds (about equal to milk) for the next 3 weeks or until cleared to do so at follow-up appointment.  Otherwise activity as tolerated by comfort level.  Diet: May resume your regular diet as tolerated. Do not eat salty sharp food such as Tostitos chips until the cheek heals this should take a few days   Driving: No driving while still taking opiate pain medications (weight at least 6-8 hours after last dose).  No driving if you still sore from surgery as it may limit her ability to react quickly if necessary.   Shower/bath: May shower and get incision wet pad 24 hours post surgery.  Do not scrub vigorously for the next 2-3 weeks.  Do not soak incision (ID soaking in bath or swimming) until 3 weeks post incision.   Wound care: He may cover wounds with sterile gauze as needed to prevent incisions rubbing on close follow-up in any seepage.  Where tight fitting underpants/scrotal support for at least 2 weeks.  He should apply cold compresses (ice or sac of frozen peas/corn) to your scrotum for at least 48 hours to reduce the swelling for 15 minutes at a time indirectly.  You should expect that his scrotum will swell up initially and then get smaller over the next 2-4 weeks.  Follow-up appointments: Follow-up appointment will be scheduled with Dr. Shane for a wound check and  retrograde urethrogram.

## 2024-01-03 NOTE — Anesthesia Postprocedure Evaluation (Signed)
 Anesthesia Post Note  Patient: Daryl Webb  Procedure(s) Performed: URETHROPLASTY,TRANSURETHRAL,  INLAY WITH BUCCAL GRAFT (Perineum) CYSTOSCOPY WITH RETROGRADE URETHROGRAM (Penis)     Patient location during evaluation: PACU Anesthesia Type: General Level of consciousness: awake and alert Pain management: pain level controlled Vital Signs Assessment: post-procedure vital signs reviewed and stable Respiratory status: spontaneous breathing, nonlabored ventilation and respiratory function stable Cardiovascular status: blood pressure returned to baseline and stable Postop Assessment: no apparent nausea or vomiting Anesthetic complications: no   No notable events documented.  Last Vitals:  Vitals:   01/03/24 1800 01/03/24 1815  BP: 127/79 (!) 140/84  Pulse: 72 69  Resp: 18 17  Temp:  36.4 C  SpO2: 98% 96%    Last Pain:  Vitals:   01/03/24 1800  TempSrc:   PainSc: 0-No pain                 Jayli Fogleman,W. EDMOND

## 2024-01-03 NOTE — Anesthesia Procedure Notes (Signed)
 Procedure Name: Intubation Date/Time: 01/03/2024 1:32 PM  Performed by: Judythe Tanda Aran, CRNAPre-anesthesia Checklist: Patient identified, Emergency Drugs available, Suction available and Patient being monitored Patient Re-evaluated:Patient Re-evaluated prior to induction Oxygen Delivery Method: Circle system utilized Preoxygenation: Pre-oxygenation with 100% oxygen Induction Type: IV induction Ventilation: Mask ventilation without difficulty Laryngoscope Size: 2 and Miller Grade View: Grade I Tube type: Oral Tube size: 7.5 mm Number of attempts: 1 Airway Equipment and Method: Stylet Placement Confirmation: ETT inserted through vocal cords under direct vision, positive ETCO2 and breath sounds checked- equal and bilateral Secured at: 22 cm Tube secured with: Tape Dental Injury: Teeth and Oropharynx as per pre-operative assessment

## 2024-01-03 NOTE — Transfer of Care (Signed)
 Immediate Anesthesia Transfer of Care Note  Patient: Daryl Webb  Procedure(s) Performed: WINDY SHEFFIELD CHIMERA WITH BUCCAL GRAFT (Perineum) CYSTOSCOPY WITH RETROGRADE URETHROGRAM (Penis)  Patient Location: PACU  Anesthesia Type:General  Level of Consciousness: drowsy  Airway & Oxygen Therapy: Patient Spontanous Breathing and Patient connected to face mask  Post-op Assessment: Report given to RN and Post -op Vital signs reviewed and stable  Post vital signs: Reviewed and stable  Last Vitals:  Vitals Value Taken Time  BP 111/62 01/03/24 17:31  Temp    Pulse 66 01/03/24 17:35  Resp 14 01/03/24 17:35  SpO2 100 % 01/03/24 17:35  Vitals shown include unfiled device data.  Last Pain:  Vitals:   01/03/24 1137  TempSrc: Oral  PainSc: 0-No pain         Complications: No notable events documented.

## 2024-01-04 ENCOUNTER — Encounter (HOSPITAL_COMMUNITY): Payer: Self-pay | Admitting: Urology

## 2024-01-06 DIAGNOSIS — M6281 Muscle weakness (generalized): Secondary | ICD-10-CM | POA: Diagnosis not present

## 2024-01-06 DIAGNOSIS — R278 Other lack of coordination: Secondary | ICD-10-CM | POA: Diagnosis not present

## 2024-01-06 DIAGNOSIS — R41841 Cognitive communication deficit: Secondary | ICD-10-CM | POA: Diagnosis not present

## 2024-01-06 DIAGNOSIS — R296 Repeated falls: Secondary | ICD-10-CM | POA: Diagnosis not present

## 2024-01-06 DIAGNOSIS — R2681 Unsteadiness on feet: Secondary | ICD-10-CM | POA: Diagnosis not present

## 2024-01-06 DIAGNOSIS — G309 Alzheimer's disease, unspecified: Secondary | ICD-10-CM | POA: Diagnosis not present

## 2024-01-09 ENCOUNTER — Emergency Department (HOSPITAL_COMMUNITY)
Admission: EM | Admit: 2024-01-09 | Discharge: 2024-01-09 | Disposition: A | Discharge status: Home / Self Care | Attending: Emergency Medicine | Admitting: Emergency Medicine

## 2024-01-09 ENCOUNTER — Other Ambulatory Visit: Payer: Self-pay

## 2024-01-09 ENCOUNTER — Encounter (HOSPITAL_COMMUNITY): Payer: Self-pay

## 2024-01-09 DIAGNOSIS — Z794 Long term (current) use of insulin: Secondary | ICD-10-CM | POA: Diagnosis not present

## 2024-01-09 DIAGNOSIS — N3289 Other specified disorders of bladder: Secondary | ICD-10-CM | POA: Insufficient documentation

## 2024-01-09 DIAGNOSIS — R3989 Other symptoms and signs involving the genitourinary system: Secondary | ICD-10-CM | POA: Diagnosis present

## 2024-01-09 DIAGNOSIS — F02B0 Dementia in other diseases classified elsewhere, moderate, without behavioral disturbance, psychotic disturbance, mood disturbance, and anxiety: Secondary | ICD-10-CM | POA: Diagnosis not present

## 2024-01-09 LAB — BASIC METABOLIC PANEL WITH GFR
Anion gap: 8 (ref 5–15)
BUN: 24 mg/dL — ABNORMAL HIGH (ref 8–23)
CO2: 26 mmol/L (ref 22–32)
Calcium: 9.4 mg/dL (ref 8.9–10.3)
Chloride: 105 mmol/L (ref 98–111)
Creatinine, Ser: 2.02 mg/dL — ABNORMAL HIGH (ref 0.61–1.24)
GFR, Estimated: 33 mL/min — ABNORMAL LOW (ref >60)
Glucose, Bld: 81 mg/dL (ref 70–99)
Potassium: 4.1 mmol/L (ref 3.5–5.1)
Sodium: 139 mmol/L (ref 135–145)

## 2024-01-09 LAB — CBC WITH DIFFERENTIAL/PLATELET
Abs Immature Granulocytes: 0.03 K/uL (ref 0.00–0.07)
Basophils Absolute: 0.1 K/uL (ref 0.0–0.1)
Basophils Relative: 1 %
Eosinophils Absolute: 0.3 K/uL (ref 0.0–0.5)
Eosinophils Relative: 2 %
HCT: 39.6 % (ref 39.0–52.0)
Hemoglobin: 13 g/dL (ref 13.0–17.0)
Immature Granulocytes: 0 %
Lymphocytes Relative: 12 %
Lymphs Abs: 1.6 K/uL (ref 0.7–4.0)
MCH: 34.6 pg — ABNORMAL HIGH (ref 26.0–34.0)
MCHC: 32.8 g/dL (ref 30.0–36.0)
MCV: 105.3 fL — ABNORMAL HIGH (ref 80.0–100.0)
Monocytes Absolute: 1.1 K/uL — ABNORMAL HIGH (ref 0.1–1.0)
Monocytes Relative: 8 %
Neutro Abs: 10.1 K/uL — ABNORMAL HIGH (ref 1.7–7.7)
Neutrophils Relative %: 77 %
Platelets: 184 K/uL (ref 150–400)
RBC: 3.76 MIL/uL — ABNORMAL LOW (ref 4.22–5.81)
RDW: 13.8 % (ref 11.5–15.5)
Smear Review: NORMAL
WBC: 13.2 K/uL — ABNORMAL HIGH (ref 4.0–10.5)
nRBC: 0 % (ref 0.0–0.2)

## 2024-01-09 LAB — URINALYSIS, ROUTINE W REFLEX MICROSCOPIC
Bilirubin Urine: NEGATIVE
Glucose, UA: 150 mg/dL — AB
Ketones, ur: NEGATIVE mg/dL
Nitrite: NEGATIVE
Protein, ur: 100 mg/dL — AB
RBC / HPF: >50 RBC/hpf (ref 0–5)
Specific Gravity, Urine: 1.016 (ref 1.005–1.030)
pH: 5 (ref 5.0–8.0)

## 2024-01-09 MED ORDER — HYOSCYAMINE SULFATE 0.125 MG PO TBDP
0.2500 mg | ORAL_TABLET | Freq: Once | ORAL | Status: AC
  Administered 2024-01-09: 0.25 mg via ORAL
  Filled 2024-01-09: qty 2

## 2024-01-09 MED ORDER — HYOSCYAMINE SULFATE 0.125 MG/ML PO SOLN: 0.2500 mg | ORAL | 0 refills | Status: DC | PRN

## 2024-01-09 MED ORDER — HYOSCYAMINE SULFATE 0.125 MG PO TABS: 0.2500 mg | ORAL_TABLET | ORAL | 0 refills | Status: AC | PRN

## 2024-01-09 NOTE — ED Provider Notes (Signed)
 [place] [facility] AT [facility] Provider Note   CSN: [id number] Arrival date & time: 01/09/24  1246     Patient presents with: No chief complaint on file.   Kenneth R Frady is a 78 y.o. male.   This is a pleasant 78 year old male here today with bladder pain.  Patient had Foley catheter placed secondary to dilation of urethral strictures on the 25th with Dr. Shane.  Op note reviewed.  He is here today because he has had some pain in his bladder.  Feels the need to urinate.  Has been having urine in his Foley catheter.        Prior to Admission medications   Medication Sig Start Date End Date Taking? Authorizing Provider  hyoscyamine  (LEVSIN ) 0.125 MG/ML solution Take 2 mLs (0.25 mg total) by mouth every 4 (four) hours as needed for bladder spasms. 01/09/24  Yes Mannie Pac T, DO  amLODipine  (NORVASC ) 2.5 MG tablet TAKE 1 TABLET BY MOUTH EVERY DAY 04/25/23   Jordan, [name], MD  atorvastatin  (LIPITOR) 40 MG tablet Take 1 tablet (40 mg total) by mouth daily. Patient taking differently: Take 40 mg by mouth at bedtime. 12/15/22   Hilty, Vinie BROCKS, MD  BD PEN NEEDLE NANO 2ND GEN 32G X 4 MM MISC daily. 11/22/22   [provider]  calcium  carbonate (TUMS - DOSED IN MG ELEMENTAL CALCIUM ) 500 MG chewable tablet Chew 2 tablets by mouth daily as needed for indigestion or heartburn.    [provider]  chlorhexidine  (PERIDEX ) 0.12 % solution Use as directed 15 mLs in the mouth or throat 2 (two) times daily for 14 days. 01/03/24 01/19/24  Shane Steffan BROCKS, MD  Cholecalciferol (VITAMIN D3) 1000 units CAPS Take 2,000 Units by mouth daily. 01/14/22   Kinsinger, Herlene Righter, MD  citalopram  (CELEXA ) 10 MG tablet TAKE 1 TABLET BY MOUTH EVERY DAY 10/17/23   Camara, Amadou, MD  Coenzyme Q10 (COQ10) 100 MG CAPS Take 100 mg by mouth daily.    [provider]  Continuous Glucose Receiver (DEXCOM G7 RECEIVER) DEVI use to monitor blood sugar for  365 days Patient not taking: Reported on 12/13/2023 08/27/22   [provider]  Continuous Glucose Sensor (DEXCOM G7 SENSOR) MISC Inject 1 Device into the skin See admin instructions. Place 1 new sensor into the skin every 10-11 days Patient not taking: Reported on 12/13/2023 10/27/23   Jordan, [name], MD  CRANBERRY PO Take 1 tablet by mouth in the morning.    [provider]  donepezil  (ARICEPT ) 10 MG tablet Take 1 tablet (10 mg total) by mouth at bedtime. 03/14/23 03/08/24  Gregg Lek, MD  FARXIGA  10 MG TABS tablet Take 10 mg by mouth daily. 12/05/23   [provider]  ferrous gluconate  (FERGON) 324 MG tablet TAKE 1 TABLET BY MOUTH EVERY DAY WITH BREAKFAST 12/13/23   Mansouraty, Gabriel Jr., MD  glucose 4 GM chewable tablet Chew 1 tablet by mouth as needed for low blood sugar.    [provider]  Insulin  Glargine (BASAGLAR  KWIKPEN) 100 UNIT/ML Inject 26-30 Units into the skin daily. 03/02/22   [provider]  losartan -hydrochlorothiazide  (HYZAAR) 100-25 MG tablet Take 1 tablet by mouth in the morning. 02/26/22   [provider]  Melatonin 5 MG CHEW Chew 15 mg by mouth at bedtime.    [provider]  methocarbamol  (ROBAXIN ) 500 MG tablet Take 1 tablet (500 mg total) by mouth 3 (three) times daily for 7  days. 01/03/24 01/10/24  Shane Steffan BROCKS, MD  metoprolol  succinate (TOPROL -XL) 50 MG 24 hr tablet TAKE 1 TABLET DAILY, WITH  OR IMMEDIATELY FOLLOWING A MEAL 12/13/22   Hilty, Vinie BROCKS, MD  NON FORMULARY Take 4 tablets by mouth See admin instructions. Focus Factor tablets - Take 4 tablets by mouth in the morning with breakfast    [provider]  oxyCODONE  (ROXICODONE ) 5 MG immediate release tablet Take 1 tablet (5 mg total) by mouth every 8 (eight) hours as needed for up to 10 doses. 01/03/24   Shane Steffan BROCKS, MD  OZEMPIC, 0.25 OR 0.5 MG/DOSE, 2 MG/3ML SOPN Inject 0.5 mg into the skin every Sunday.    [provider]   pantoprazole  (PROTONIX ) 40 MG tablet TAKE 1 TABLET BY MOUTH EVERY DAY Patient taking differently: Take 20 mg by mouth every other day. 11/02/23   Mansouraty, Aloha Raddle., MD  TYLENOL  500 MG tablet Take 1,000 mg by mouth every 6 (six) hours as needed for moderate pain (pain score 4-6) or headache.    [provider]    Allergies: Aspirin , Diclofenac , Excedrin tension headache [acetaminophen -caffeine], Motrin [ibuprofen], and Nsaids    Review of Systems  Updated Vital Signs BP 118/86 (BP Location: Right Arm)   Pulse 68   Temp 97.8 F (36.6 C) (Oral)   Resp 18   SpO2 99%   Physical Exam Vitals and nursing note reviewed.  HENT:     Head: Normocephalic and atraumatic.  Cardiovascular:     Rate and Rhythm: Normal rate.  Pulmonary:     Effort: Pulmonary effort is normal.  Abdominal:     General: Abdomen is flat. There is no distension.     Palpations: Abdomen is soft.     Tenderness: There is no abdominal tenderness.  Genitourinary:    Penis: Normal.      Comments: Foley catheter in place, yellow urine in Foley bag.  Bladder scan showing 0 mL Musculoskeletal:     Cervical back: Normal range of motion.  Neurological:     Mental Status: He is alert.     (all labs ordered are listed, but only abnormal results are displayed) Labs Reviewed  BASIC METABOLIC PANEL WITH GFR - Abnormal; Notable for the following components:      Result Value   BUN 24 (*)    Creatinine, Ser 2.02 (*)    GFR, Estimated 33 (*)    All other components within normal limits  CBC WITH DIFFERENTIAL/PLATELET - Abnormal; Notable for the following components:   WBC 13.2 (*)    RBC 3.76 (*)    MCV 105.3 (*)    MCH 34.6 (*)    Neutro Abs 10.1 (*)    Monocytes Absolute 1.1 (*)    All other components within normal limits  URINALYSIS, ROUTINE W REFLEX MICROSCOPIC    EKG: None  Radiology: No results found.   Procedures   Medications Ordered in the ED  hyoscyamine  (ANASPAZ ) disintergrating  tablet 0.25 mg (has no administration in time range)                                    Medical Decision Making 78 year old male here today with bladder pain.  Differential diagnoses include urinary retention, bladder spasm, consider cystitis.  Plan-patient arrived with discharge paperwork that read, and underlined, highlighted in all caps font  DO NOT ALLOW ER DOCTOR TO TOUCH FOLEY CATHETER.Which  seems like a good advice.  Patient is overall comfortable appearing, does not appear to have any urinary retention.  Suspected bladder spasm.  Urology PA Delia was in the emergency room, and graciously evaluated the patient.  He suspects bladder spasm, recommends hyoscyamine .  Patient previously been taking this, however stopped because he was feeling good.  His renal function is at baseline, no anemia.  Currently pending urine.  Amount and/or Complexity of Data Reviewed Labs: ordered.  Risk Prescription drug management.        Final diagnoses:  Bladder spasm    ED Discharge Orders          Ordered    hyoscyamine  (LEVSIN ) 0.125 MG/ML solution  Every 4 hours PRN        01/09/24 1445               Mannie Pac T, DO 01/09/24 1445

## 2024-01-09 NOTE — ED Provider Notes (Signed)
 Clinical Course as of 01/09/24 1531  Mon Jan 09, 2024  1503 Received signout; pending UA. 5 days post op from stricture. Having bladder spasms. Discharge +/- antibiotics.  [TY]  1525 Urinalysis, Routine w reflex microscopic -Urine, Catheterized; Indwelling urinary catheter(!) Not c/w UTI. Will discharge as planned with urology follow up.  [TY]    Clinical Course User Index [TY] Neysa Caron PARAS, DO      Neysa Caron PARAS, [place] 01/09/24 1531

## 2024-01-09 NOTE — Discharge Instructions (Addendum)
 I have sent a prescription for hyoscyamine  to your pharmacy.  This is a medicine that can help with bladder spasms. Follow up with your urology team as planned. Return for fevers, chills, severe pain, foley stops draining or you stop making urine or any new or worsening symptoms that are concerning to you.

## 2024-01-09 NOTE — ED Notes (Signed)
 Upon inspecting pt catheter is was noted that the tubing was not secured correctly in the stat-lock on patients leg which was causing the tubing to pull from the penis, the tubing was placed properly in the stat lock and that corrected the pulling, patient noticed an immediate relief of a pulling sensation of the tubing.

## 2024-01-09 NOTE — ED Triage Notes (Signed)
 [facility] reports pt coming from the [facility] for foley issues. Pt c/o burning and pulling at the catheter.  Pt had catheter placed on the 25th. Pt has also had decreased urine output.

## 2024-01-13 ENCOUNTER — Other Ambulatory Visit (HOSPITAL_BASED_OUTPATIENT_CLINIC_OR_DEPARTMENT_OTHER): Payer: Self-pay | Admitting: Internal Medicine

## 2024-01-23 ENCOUNTER — Telehealth: Payer: Self-pay | Admitting: Internal Medicine

## 2024-01-23 ENCOUNTER — Other Ambulatory Visit: Payer: Self-pay

## 2024-01-23 NOTE — Telephone Encounter (Signed)
°*  STAT* If patient is at the pharmacy, call can be transferred to refill team.   1. Which medications need to be refilled? (please list name of each medication and dose if known) metoprolol  succinate (TOPROL -XL) 50 MG 24 hr tablet    2. Would you like to learn more about the convenience, safety, & potential cost savings by using the [facility]?     3. Are you open to using the [facility] (Type [facility].  ).   4. Which pharmacy/location (including street and city if local pharmacy) is medication to be sent to? [id number] - [place], [place] - [address]    5. Do they need a 30 day or 90 day supply? 14 pills, until he gets his mail order supply in.   Patient is out of medication

## 2024-01-24 MED ORDER — METOPROLOL SUCCINATE ER 50 MG PO TB24: 50.0000 mg | ORAL_TABLET | Freq: Every day | ORAL | 0 refills | Status: AC

## 2024-01-24 NOTE — Telephone Encounter (Signed)
 Temporary supply of [facility]rolol  [facility]

## 2024-02-07 ENCOUNTER — Ambulatory Visit: Admitting: Family Medicine

## 2024-02-10 ENCOUNTER — Other Ambulatory Visit: Payer: Self-pay | Admitting: Gastroenterology

## 2024-02-27 ENCOUNTER — Telehealth: Payer: Self-pay | Admitting: Family Medicine

## 2024-02-27 NOTE — Telephone Encounter (Signed)
 Patients wife was informed and stated she will come pick it up tomorrow morning.

## 2024-02-27 NOTE — Telephone Encounter (Signed)
 This request needs to be sent to his endocrinologist. Thanks, BJ

## 2024-02-27 NOTE — Telephone Encounter (Signed)
 Pt's wife dropped off form requesting information be submitted to assist patient in receiving their Dexcom G7 sensors. States if there are any questions please reach out to wife. Patient only needs sensors

## 2024-03-14 ENCOUNTER — Other Ambulatory Visit: Payer: Self-pay | Admitting: Family Medicine

## 2024-03-14 ENCOUNTER — Telehealth: Payer: Self-pay

## 2024-03-14 DIAGNOSIS — E1122 Type 2 diabetes mellitus with diabetic chronic kidney disease: Secondary | ICD-10-CM

## 2024-03-14 NOTE — Telephone Encounter (Signed)
 Medication has been filled.   Copied from CRM [id number]. Topic: Clinical - Medication Refill >> Mar 14, 2024 10:45 AM Mercedes MATSU wrote: Medication:  Continuous Glucose Receiver (DEXCOM G7 RECEIVER) DEVI Continuous Glucose Sensor (DEXCOM G7 SENSOR) MISC   Has the patient contacted their pharmacy? Yes (Agent: If no, request that the patient contact the pharmacy for the refill. If patient does not wish to contact the pharmacy document the reason why and proceed with request.) (Agent: If yes, when and what did the pharmacy advise?)  This is the patient's preferred pharmacy:  [id number] [place] [facility] - [address] [place] [address] Phone: [phone] Fax: [phone]  Is this the correct pharmacy for this prescription? Yes If no, delete pharmacy and type the correct one.   Has the prescription been filled recently? Yes  Is the patient out of the medication? Yes  Has the patient been seen for an appointment in the last year OR does the patient have an upcoming appointment? Yes  Can we respond through MyChart? Yes  Agent: Please be advised that Rx refills may take up to 3 business days. We ask that you follow-up with your pharmacy.

## 2024-03-19 ENCOUNTER — Ambulatory Visit: Payer: Medicare Other | Admitting: Neurology

## 2024-04-20 ENCOUNTER — Ambulatory Visit
